# Patient Record
Sex: Male | Born: 1945
Health system: Southern US, Community
[De-identification: ages and names within clinical notes are randomized; demographics above are authoritative.]

## PROBLEM LIST (undated history)

## (undated) DIAGNOSIS — M109 Gout, unspecified: Secondary | ICD-10-CM

## (undated) DIAGNOSIS — R350 Frequency of micturition: Secondary | ICD-10-CM

## (undated) DIAGNOSIS — H9190 Unspecified hearing loss, unspecified ear: Secondary | ICD-10-CM

## (undated) DIAGNOSIS — E785 Hyperlipidemia, unspecified: Secondary | ICD-10-CM

## (undated) DIAGNOSIS — T7840XA Allergy, unspecified, initial encounter: Secondary | ICD-10-CM

## (undated) DIAGNOSIS — E119 Type 2 diabetes mellitus without complications: Secondary | ICD-10-CM

## (undated) DIAGNOSIS — N189 Chronic kidney disease, unspecified: Secondary | ICD-10-CM

## (undated) DIAGNOSIS — I10 Essential (primary) hypertension: Secondary | ICD-10-CM

## (undated) DIAGNOSIS — K219 Gastro-esophageal reflux disease without esophagitis: Secondary | ICD-10-CM

## (undated) DIAGNOSIS — H269 Unspecified cataract: Secondary | ICD-10-CM

## (undated) DIAGNOSIS — R569 Unspecified convulsions: Secondary | ICD-10-CM

## (undated) DIAGNOSIS — F419 Anxiety disorder, unspecified: Secondary | ICD-10-CM

## (undated) DIAGNOSIS — G4730 Sleep apnea, unspecified: Secondary | ICD-10-CM

## (undated) DIAGNOSIS — R7303 Prediabetes: Secondary | ICD-10-CM

## (undated) DIAGNOSIS — G4733 Obstructive sleep apnea (adult) (pediatric): Secondary | ICD-10-CM

## (undated) HISTORY — DX: Sleep apnea, unspecified: G47.30

## (undated) HISTORY — DX: Unspecified cataract: H26.9

## (undated) HISTORY — DX: Obstructive sleep apnea (adult) (pediatric): G47.33

## (undated) HISTORY — DX: Prediabetes: R73.03

## (undated) HISTORY — DX: Unspecified convulsions: R56.9

## (undated) HISTORY — DX: Hyperlipidemia, unspecified: E78.5

## (undated) HISTORY — DX: Anxiety disorder, unspecified: F41.9

## (undated) HISTORY — DX: Gout, unspecified: M10.9

## (undated) HISTORY — DX: Allergy, unspecified, initial encounter: T78.40XA

## (undated) HISTORY — DX: Chronic kidney disease, unspecified: N18.9

## (undated) HISTORY — DX: Gastro-esophageal reflux disease without esophagitis: K21.9

## (undated) HISTORY — DX: Type 2 diabetes mellitus without complications: E11.9

---
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
## 1999-01-09 ENCOUNTER — Emergency Department (HOSPITAL_COMMUNITY): Admission: EM | Admit: 1999-01-09 | Discharge: 1999-01-09 | Payer: Self-pay | Admitting: Emergency Medicine

## 1999-01-09 ENCOUNTER — Encounter: Payer: Self-pay | Admitting: Emergency Medicine

## 1999-11-09 ENCOUNTER — Encounter: Payer: Self-pay | Admitting: Internal Medicine

## 1999-11-09 ENCOUNTER — Ambulatory Visit (HOSPITAL_COMMUNITY): Admission: RE | Admit: 1999-11-09 | Discharge: 1999-11-09 | Payer: Self-pay | Admitting: Internal Medicine

## 2005-02-18 HISTORY — PX: MYRINGOTOMY: SUR874

## 2008-09-22 ENCOUNTER — Inpatient Hospital Stay: Payer: Self-pay | Admitting: Internal Medicine

## 2008-09-22 IMAGING — CR DG CHEST 1V PORT
1 series · 1 of 1 positions shown · non-contrast
Comparison: none

REASON FOR EXAM: hematemesis
COMMENTS:

PROCEDURE:     DXR - DXR PORTABLE CHEST SINGLE VIEW  - [DATE] [DATE]
RESULT:     The lung fields are clear. No pneumonia, pneumothorax, or
pleural effusion is seen. Heart size is normal. The mediastinal and osseous
structures show no significant abnormalities. Monitoring electrodes are
present.

[view not recorded]
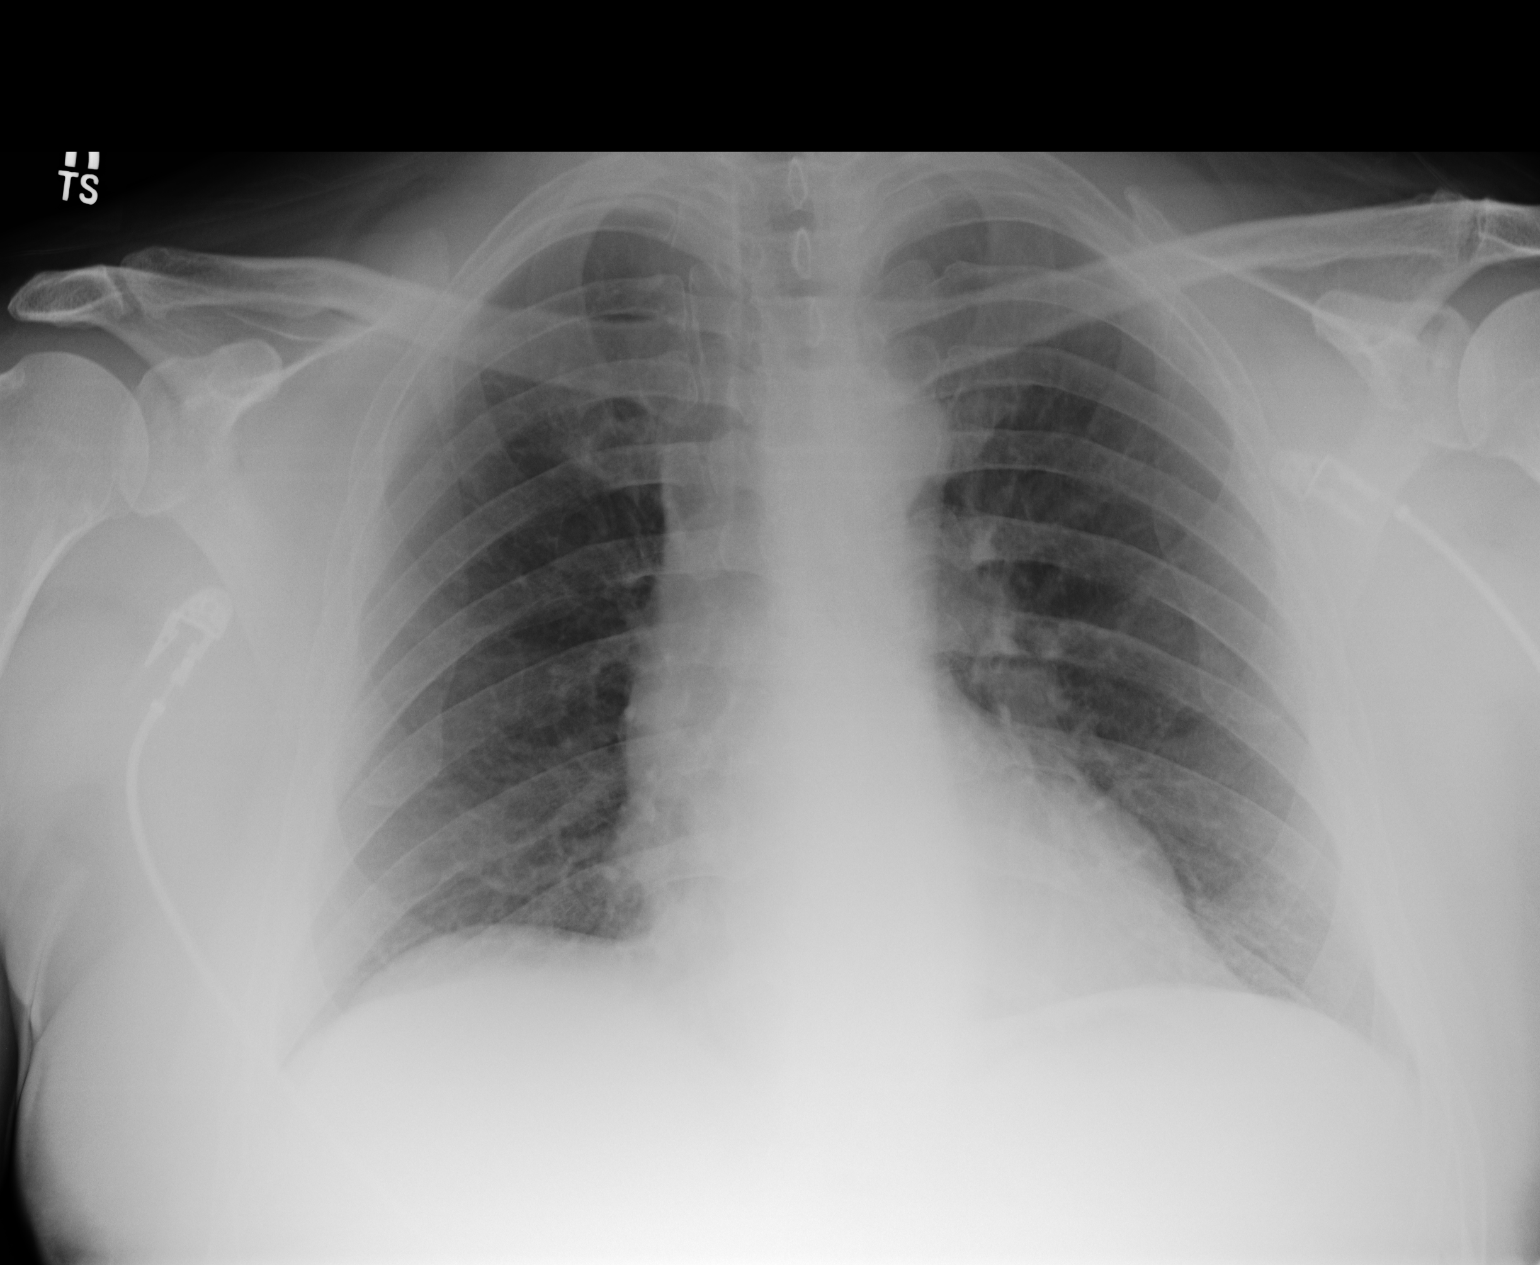

[1 of 1 positions shown; findings below may reference images not displayed]

IMPRESSION: 1.     No acute changes are identified.

## 2008-09-23 IMAGING — US US CAROTID DUPLEX BILAT
1 series · 17 of 24 positions shown · non-contrast
Comparison: none

REASON FOR EXAM: syncope
COMMENTS:

[Series 1: us carotid duplex bilat · 17 of 61 slices shown]
[im 1/61]
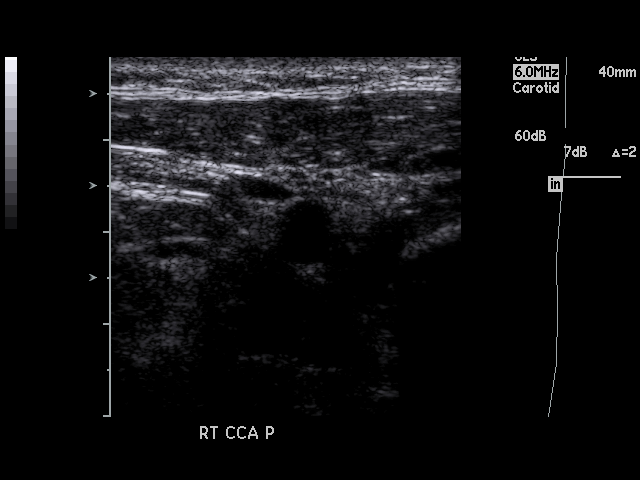
[im 6/61]
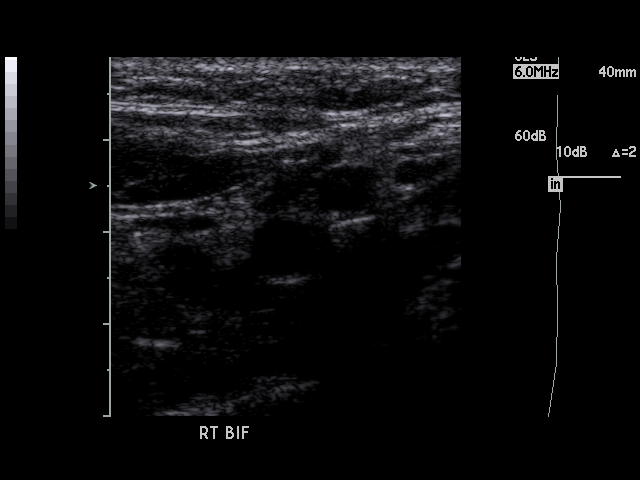
[im 8/61]
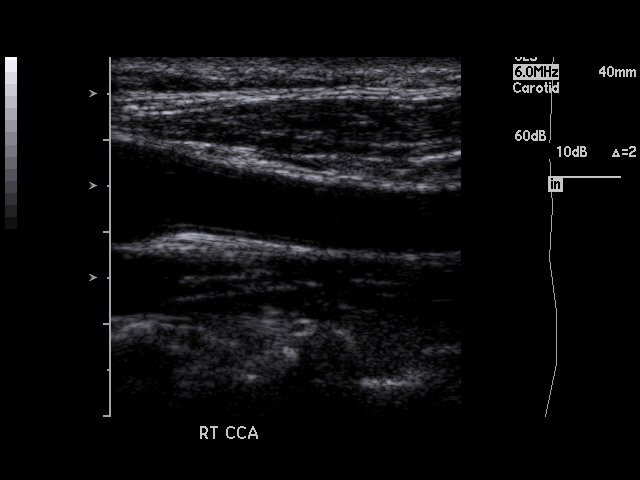
[im 11/61]
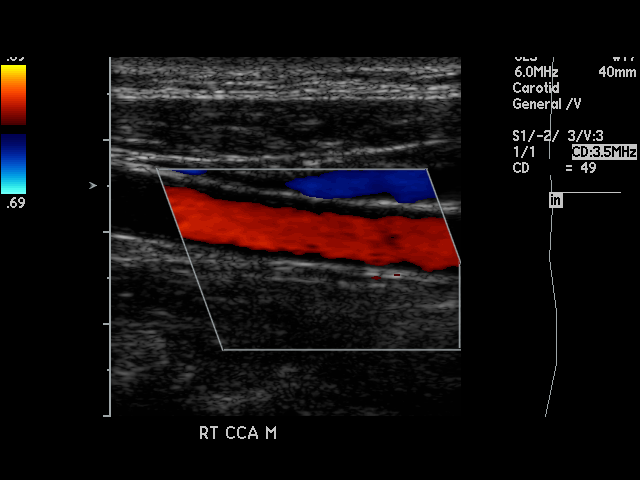
[im 16/61]
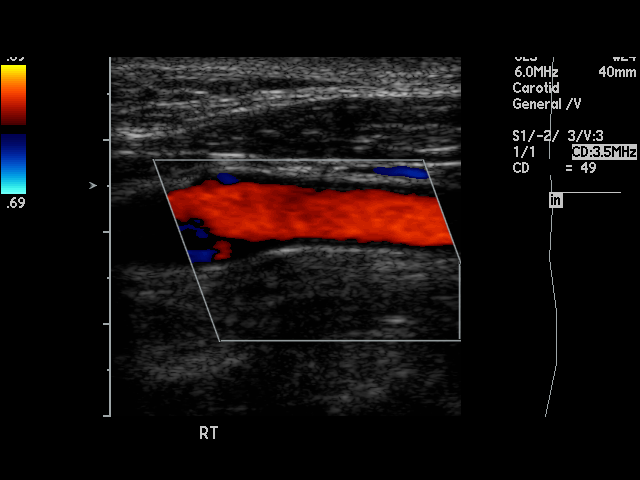
[im 19/61]
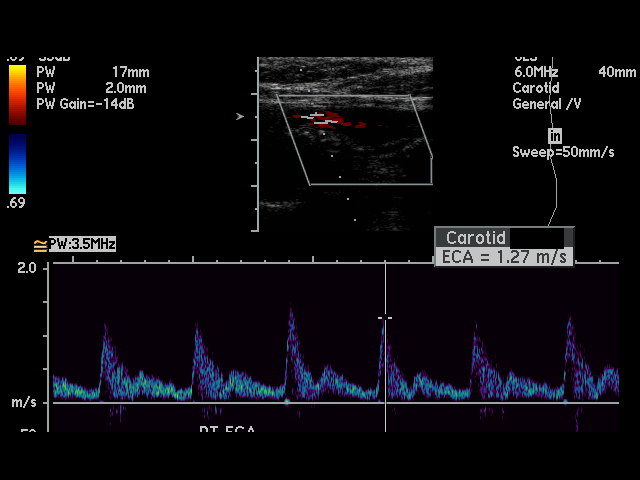
[im 24/61]
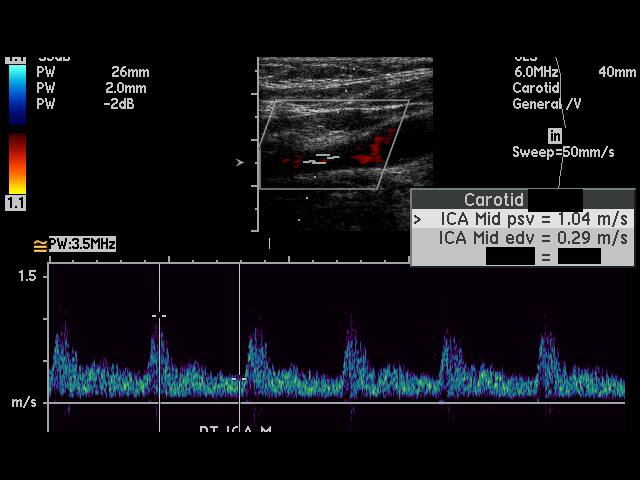
[im 27/61]
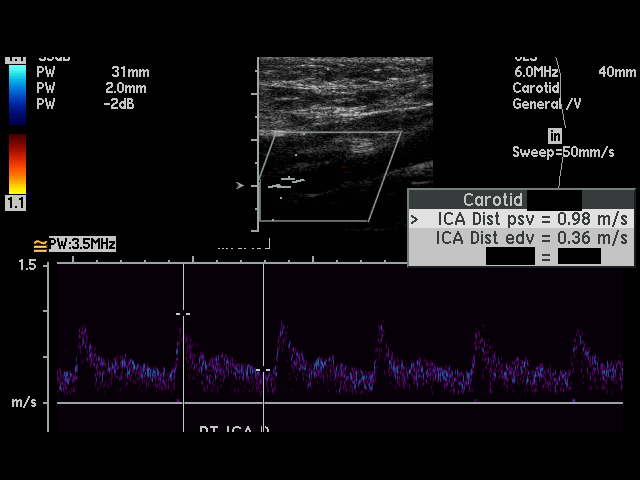
[im 32/61]
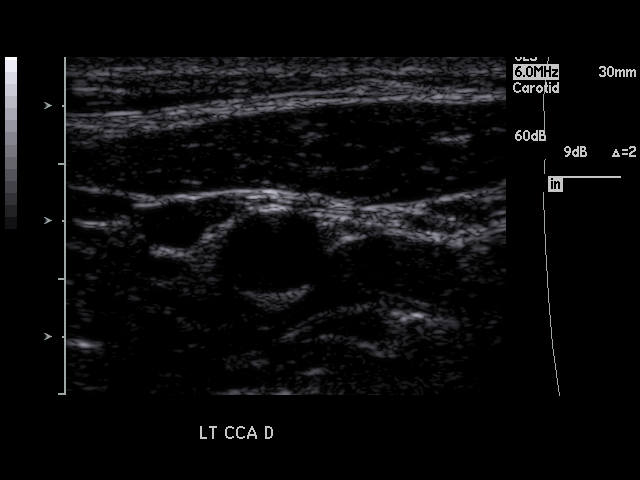
[im 34/61]
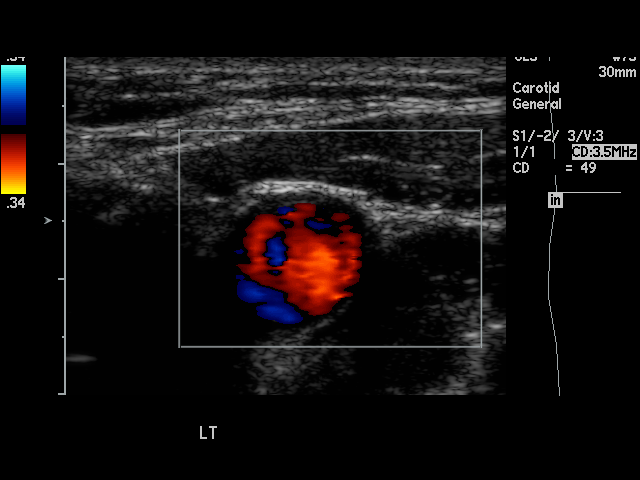
[im 37/61]
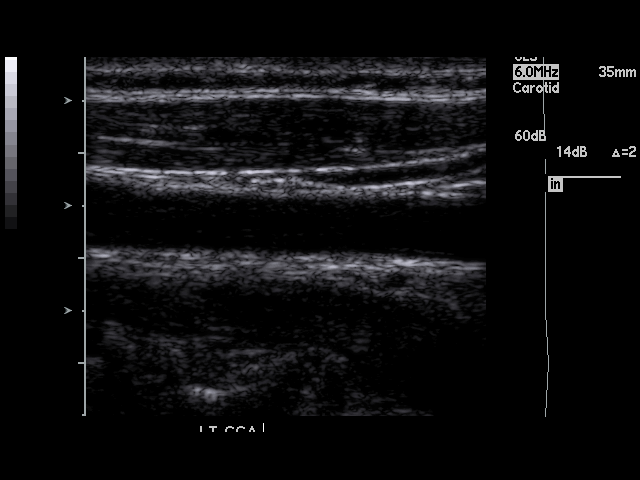
[im 42/61]
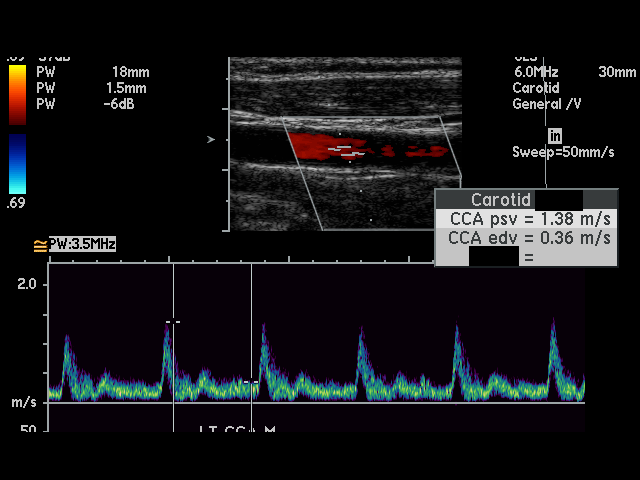
[im 45/61]
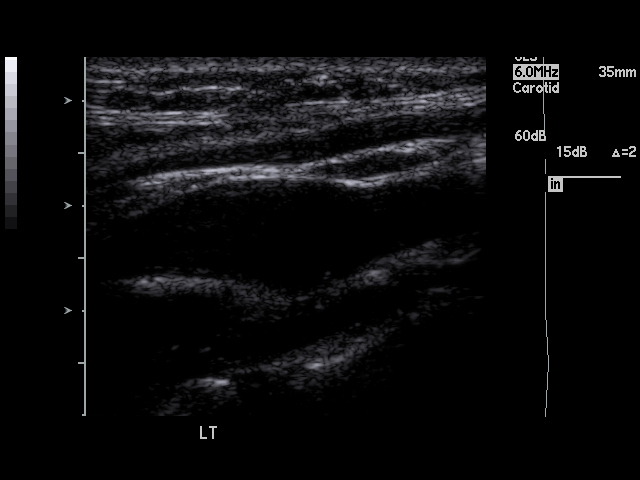
[im 50/61]
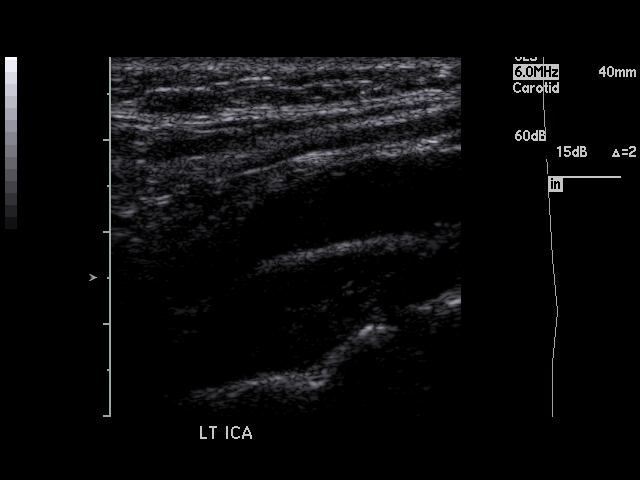
[im 53/61]
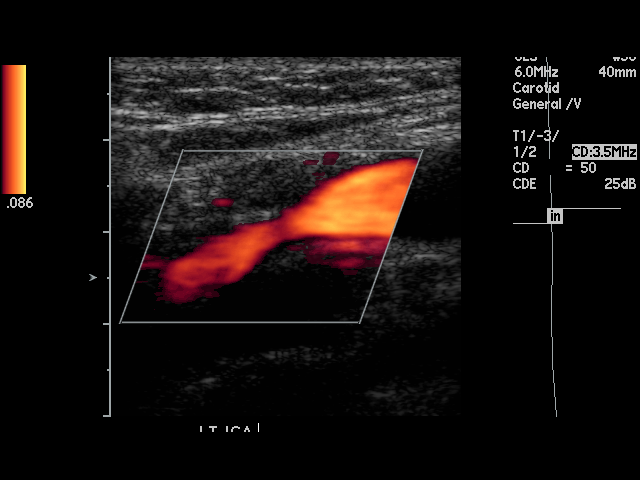
[im 55/61]
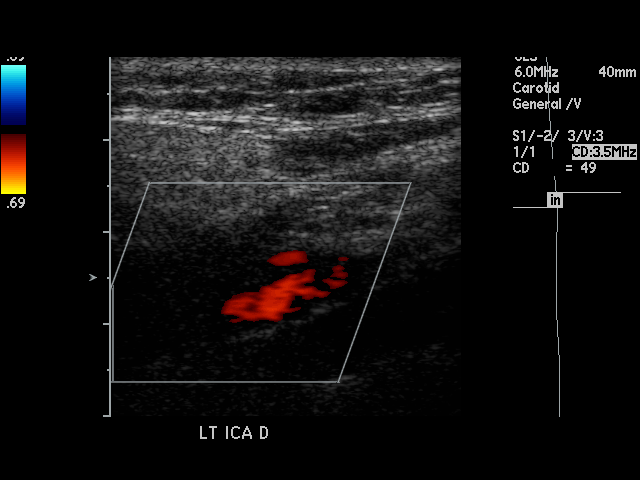
[im 61/61]
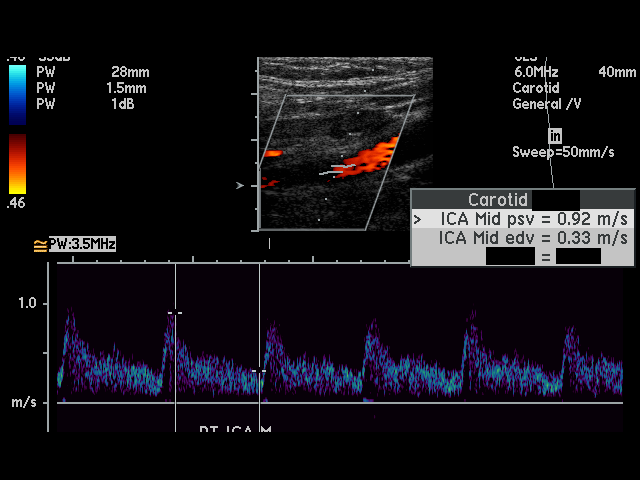

[17 of 24 positions shown; findings below may reference images not displayed]

PROCEDURE:     US  - US CAROTID DOPPLER BILATERAL  - [DATE]  [DATE]

RESULT:     No definite plaque formation is seen on either side. On the
RIGHT, the peak RIGHT common carotid artery flow velocity measure
meters per second and the peak RIGHT internal carotid artery flow velocity
measures 1.61 meters per second. The ICA/CCA ratio is 1.158.  On the LEFT,
the peak LEFT common carotid artery flow velocity measures 1.49 meters per
second and the peak LEFT internal carotid artery flow velocity measures
meters per second. The ICA/CCA ratio is 0.732.  These values bilaterally are
in the normal range and are consistent with the absence of hemodynamically
significant stenosis.

There is observed antegrade flow in both vertebrals.
IMPRESSION: 1. Normal study. No plaque formation or stenosis is seen on either side.
2. There is antegrade flow in both vertebrals.

## 2008-09-23 IMAGING — CT CT HEAD WITHOUT CONTRAST
2 series · 16 of 30 positions shown, 20 images · non-contrast
Comparison: none

REASON FOR EXAM: syncope
COMMENTS:

[Series 2: without · axial · non-contrast · 0.44mm/px · z∈[+714,+844]mm · 13 of 32 slices shown, 17 images]
[im 3/32  brain]
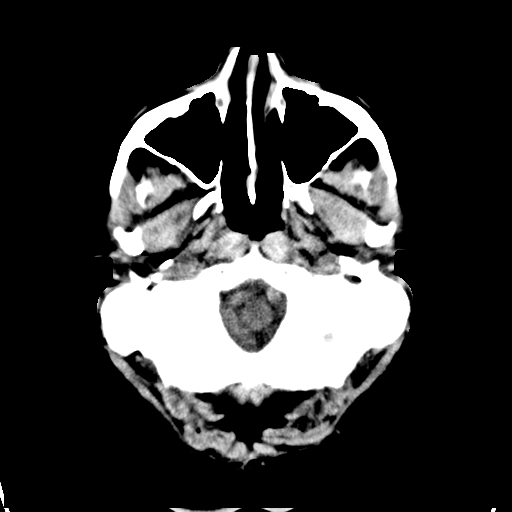
[im 3/32  bone]
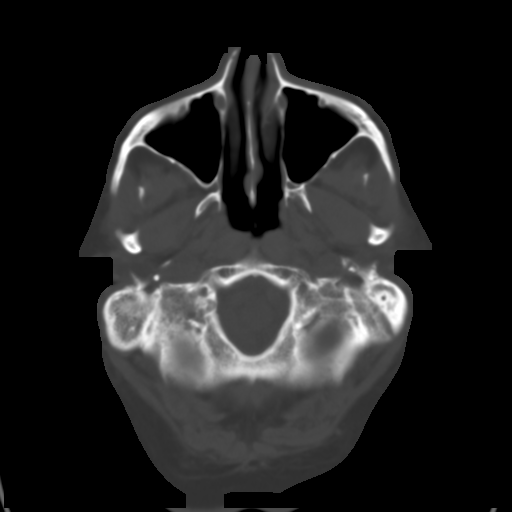
[im 5/32  brain]
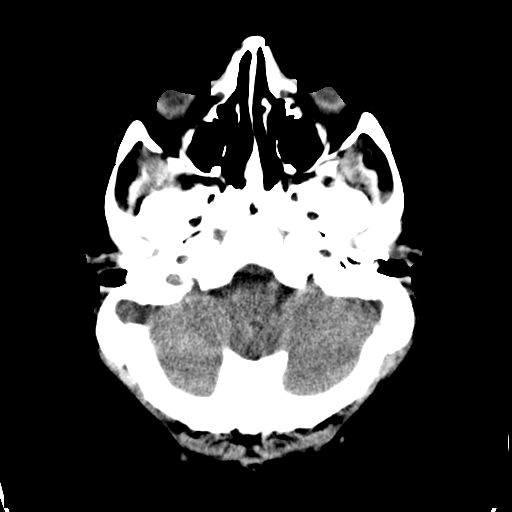
[im 7/32  brain]
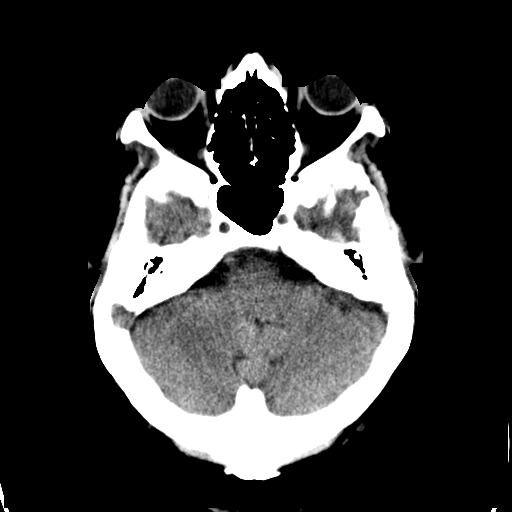
[im 9/32  brain]
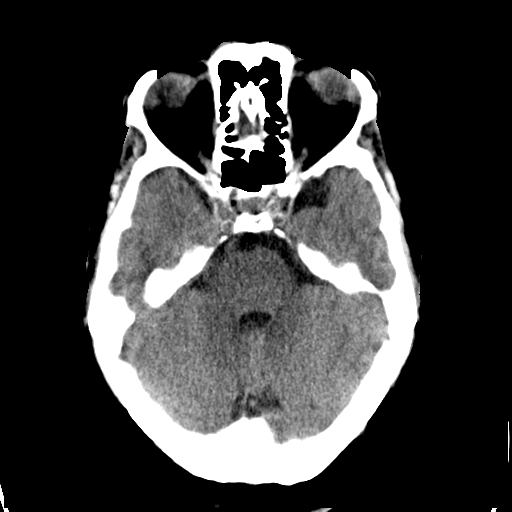
[im 12/32  brain]
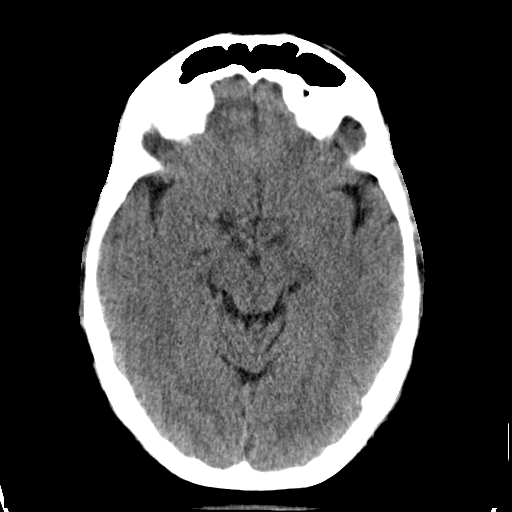
[im 12/32  bone]
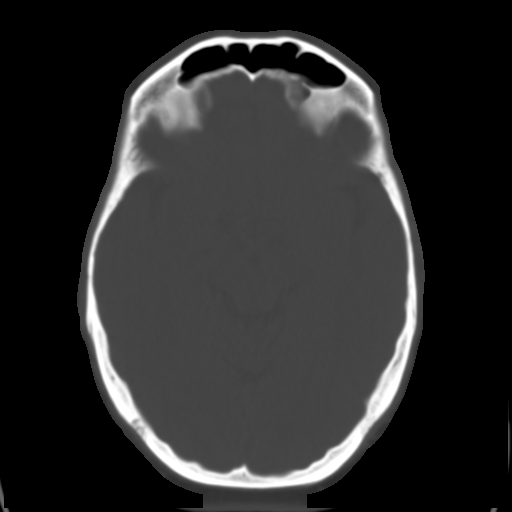
[im 14/32  brain]
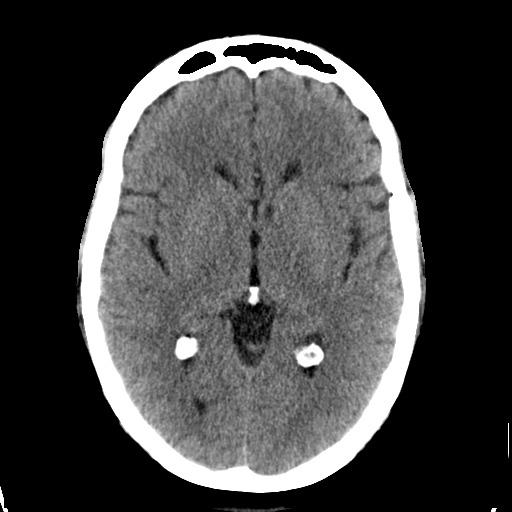
[im 16/32  brain]
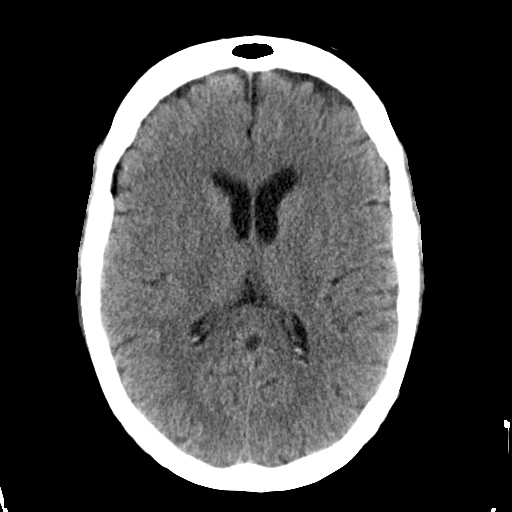
[im 18/32  brain]
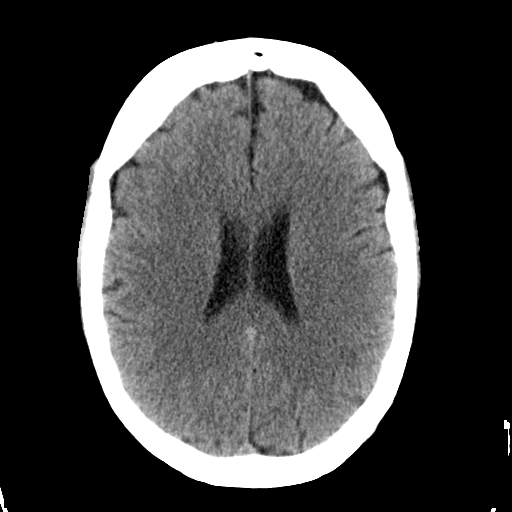
[im 20/32  brain]
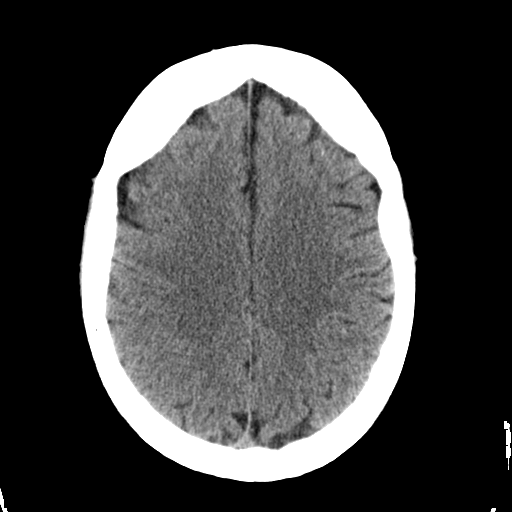
[im 20/32  bone]
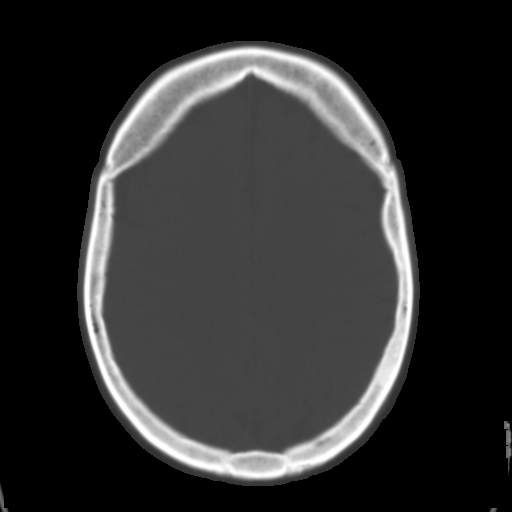
[im 23/32  brain]
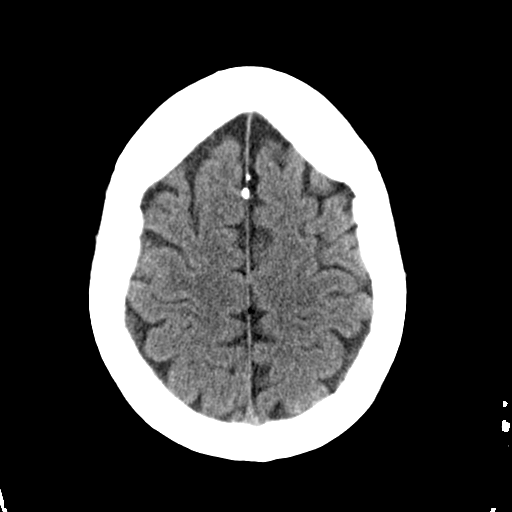
[im 25/32  brain]
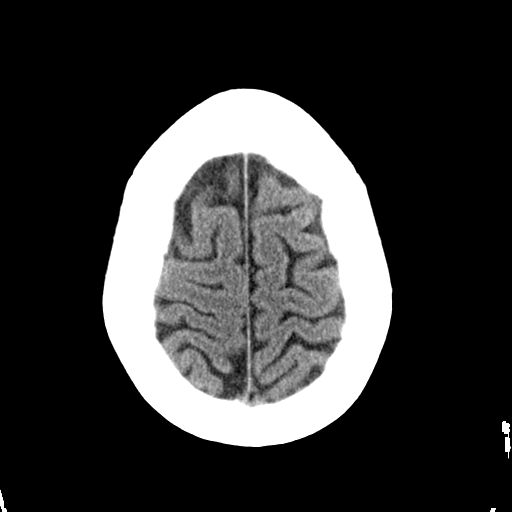
[im 27/32  brain]
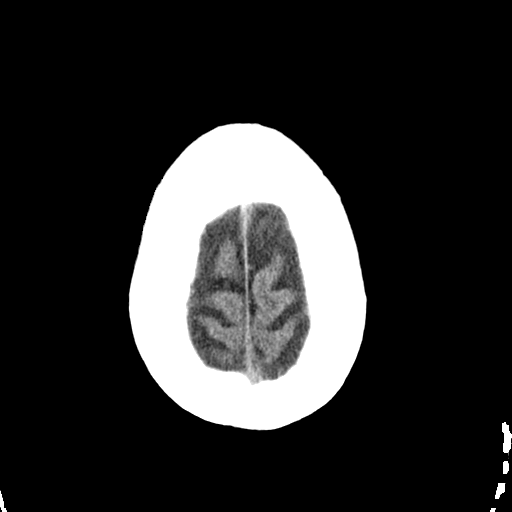
[im 29/32  brain]
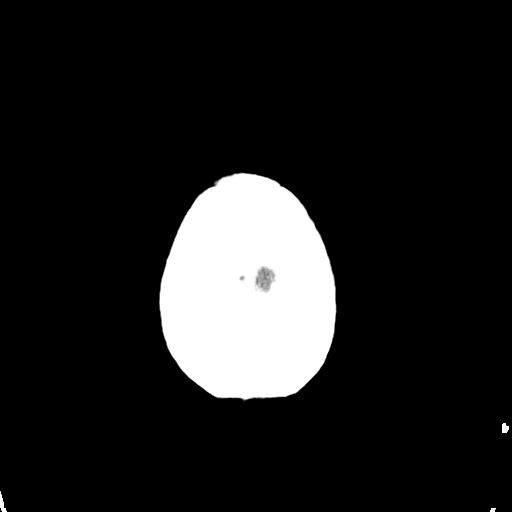
[im 29/32  bone]
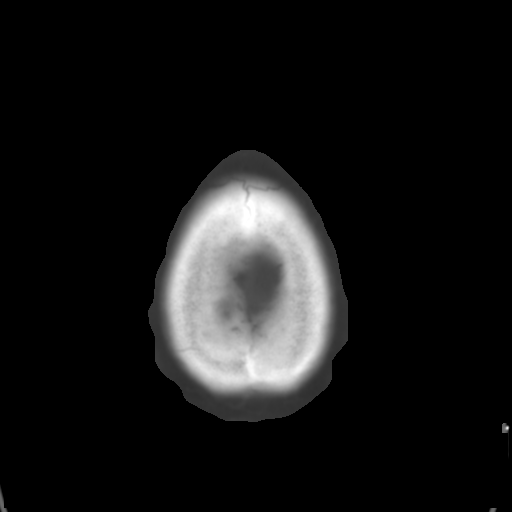

[Series 3: bone · axial · 0.44mm/px · z∈[+714,+760]mm · 3 of 32 slices shown]
[im 3/32  bone]
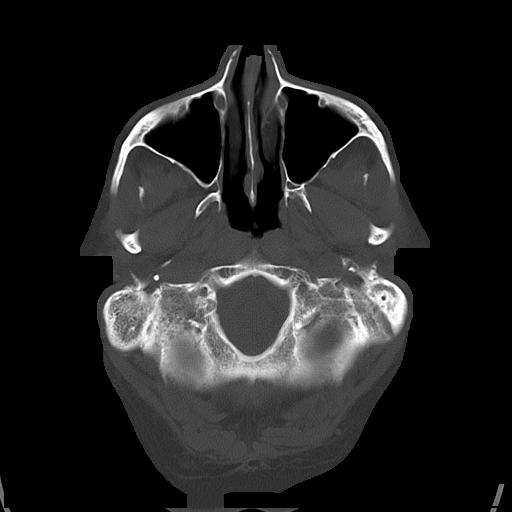
[im 7/32  bone]
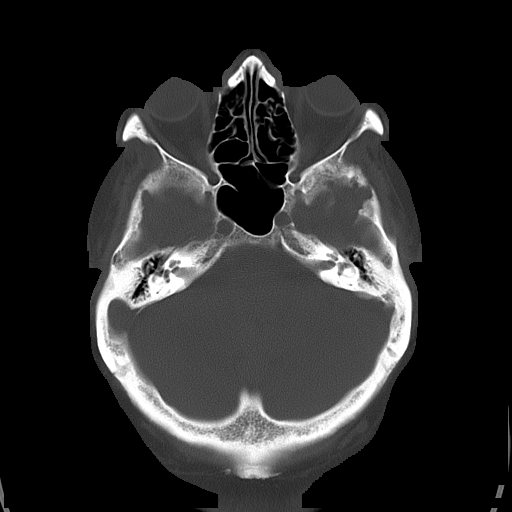
[im 12/32  bone]
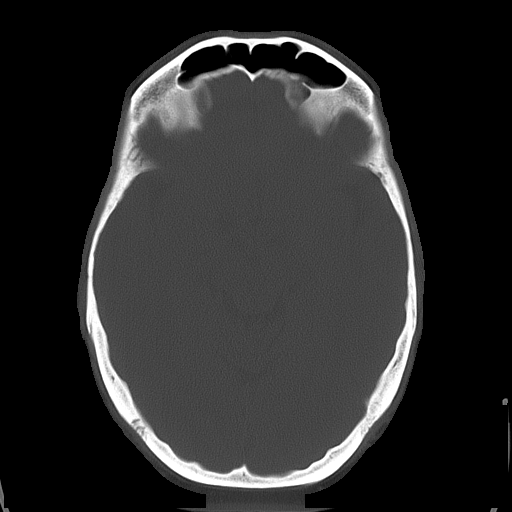

[16 of 30 positions shown; findings below may reference images not displayed]

PROCEDURE:     CT  - CT HEAD WITHOUT CONTRAST  - [DATE] [DATE]

RESULT:     Noncontrast CT of the brain is performed. The patient has no
previous exam for comparison.

The ventricles and sulci are normal. There is no hemorrhage. There is no
focal mass, mass-effect or midline shift. There is no evidence of edema or
territorial infarct. The bone windows demonstrate normal aeration of the
paranasal sinuses and mastoid air cells. There is no skull fracture
demonstrated.
IMPRESSION: 1. No acute intracranial abnormality.

## 2008-10-13 LAB — HM COLONOSCOPY

## 2009-02-25 ENCOUNTER — Emergency Department: Payer: Self-pay | Admitting: Emergency Medicine

## 2009-10-19 HISTORY — PX: FOOT SURGERY: SHX648

## 2010-09-30 ENCOUNTER — Other Ambulatory Visit: Payer: Self-pay | Admitting: Podiatry

## 2010-09-30 ENCOUNTER — Ambulatory Visit (HOSPITAL_COMMUNITY)
Admission: RE | Admit: 2010-09-30 | Discharge: 2010-09-30 | Disposition: A | Discharge status: Home / Self Care | Payer: BC Managed Care – PPO | Source: Ambulatory Visit | Attending: Podiatry | Admitting: Podiatry

## 2010-09-30 ENCOUNTER — Encounter (HOSPITAL_COMMUNITY): Payer: Self-pay

## 2010-09-30 DIAGNOSIS — M19079 Primary osteoarthritis, unspecified ankle and foot: Secondary | ICD-10-CM | POA: Insufficient documentation

## 2010-09-30 DIAGNOSIS — M25579 Pain in unspecified ankle and joints of unspecified foot: Secondary | ICD-10-CM | POA: Insufficient documentation

## 2010-09-30 DIAGNOSIS — S92309A Fracture of unspecified metatarsal bone(s), unspecified foot, initial encounter for closed fracture: Secondary | ICD-10-CM

## 2010-09-30 DIAGNOSIS — R609 Edema, unspecified: Secondary | ICD-10-CM | POA: Insufficient documentation

## 2010-09-30 DIAGNOSIS — IMO0002 Reserved for concepts with insufficient information to code with codable children: Secondary | ICD-10-CM | POA: Insufficient documentation

## 2010-09-30 HISTORY — DX: Essential (primary) hypertension: I10

## 2010-09-30 IMAGING — CT CT FOOT*R* W/O CM
3 series · 16 of 33 positions shown, 19 images · non-contrast
Comparison: None available.

CLINICAL DATA: Pain with weightbearing.  Swelling for 2-3 months.
History of fracture through toes.

CT OF THE RIGHT FOOT WITHOUT CONTRAST
TECHNIQUE: Multidetector CT imaging was performed according to the
standard protocol. Multiplanar CT image reconstructions were also
generated.

[Series 2: lower ext · axial · 0.55mm/px · z∈[-183,-36]mm · 8 of 71 slices shown, 10 images]
[im 6/71  soft-tissue]
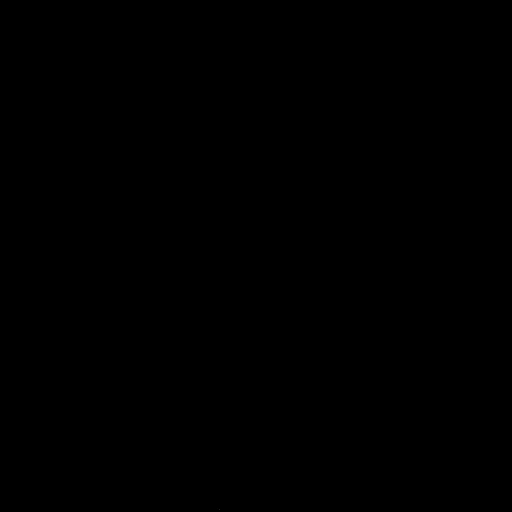
[im 6/71  bone]
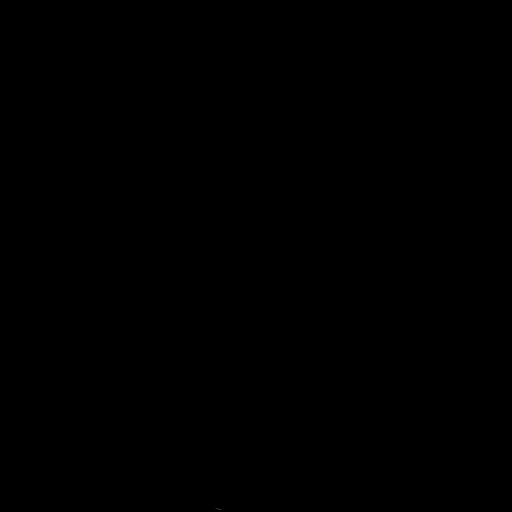
[im 17/71  bone]
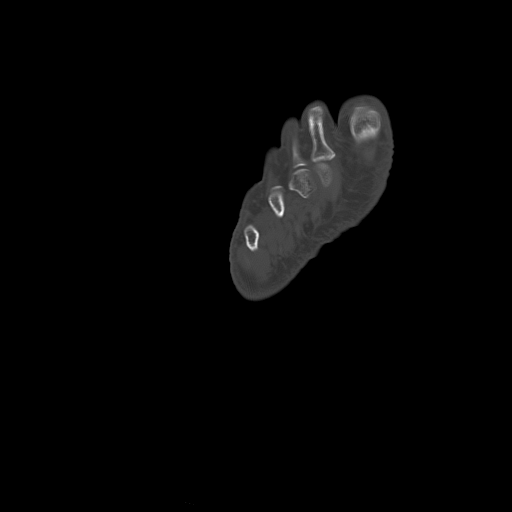
[im 22/71  bone]
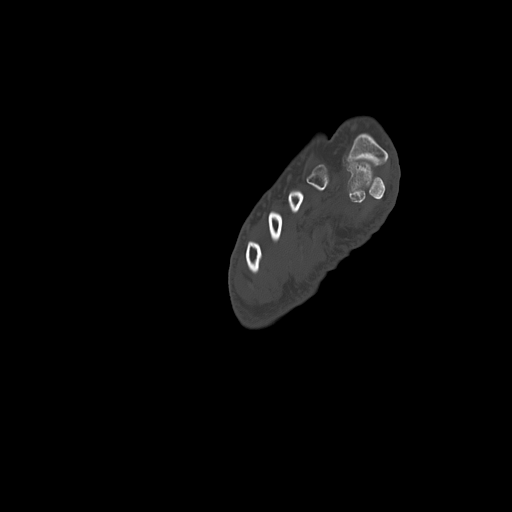
[im 33/71  bone]
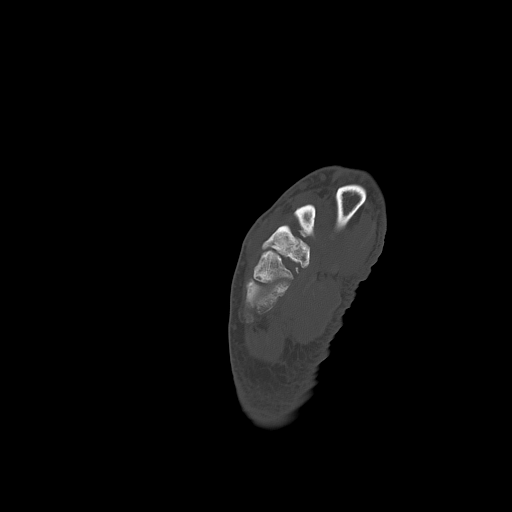
[im 38/71  soft-tissue]
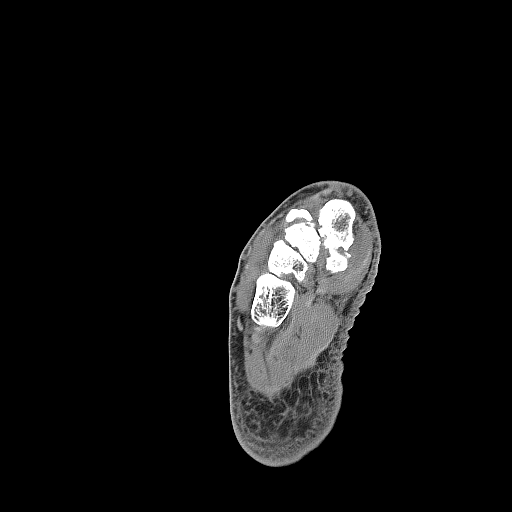
[im 38/71  bone]
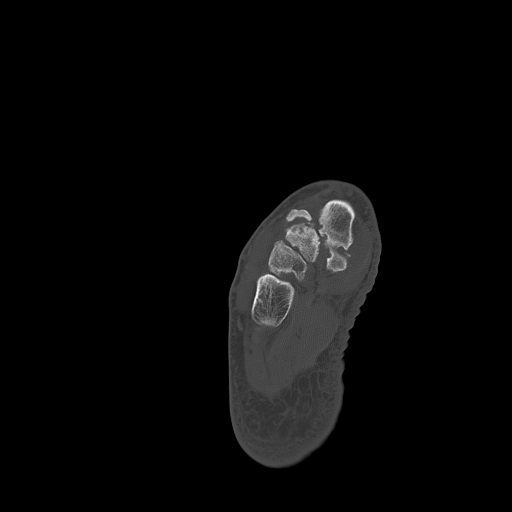
[im 49/71  bone]
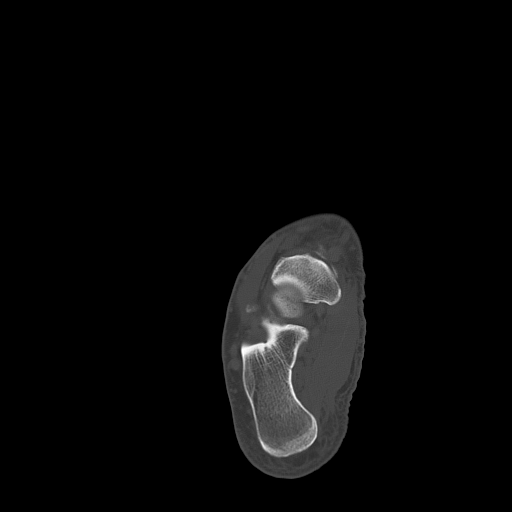
[im 54/71  bone]
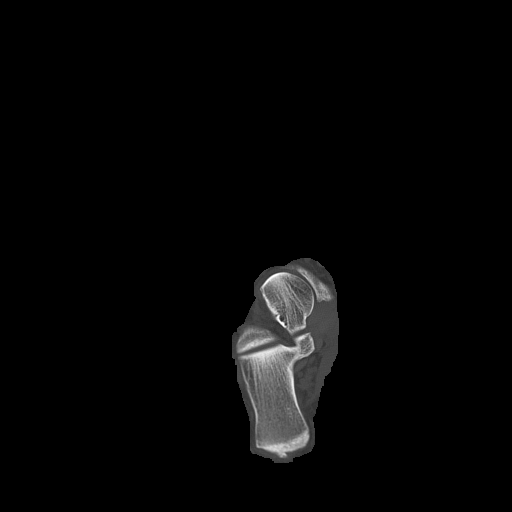
[im 65/71  bone]
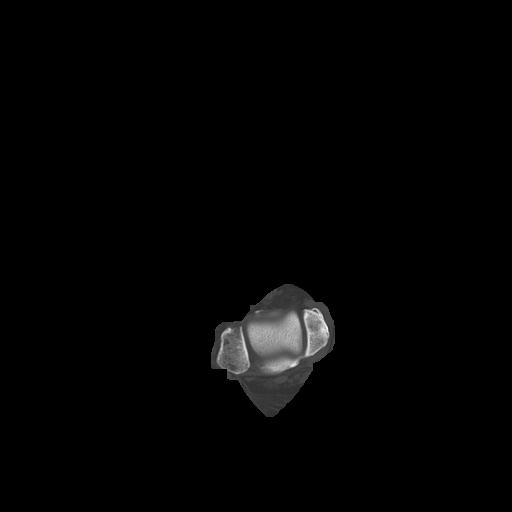

[Series 300: sagittal · coronal · 0.55mm/px · 5 of 36 slices shown, 6 images]
[im 12/36  bone]
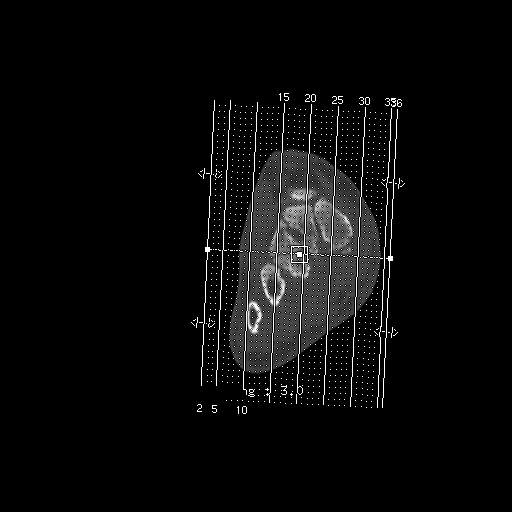
[im 15/36  bone]
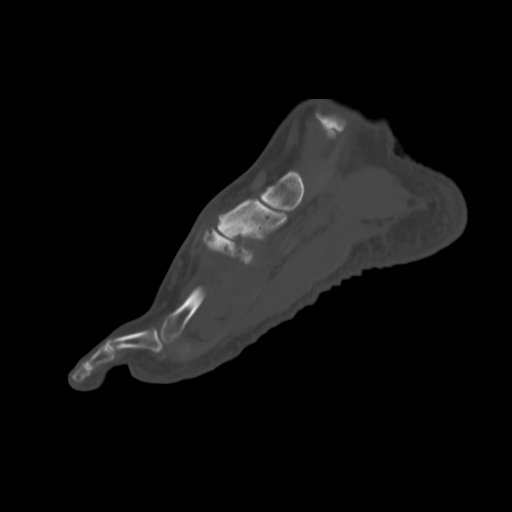
[im 18/36  soft-tissue]
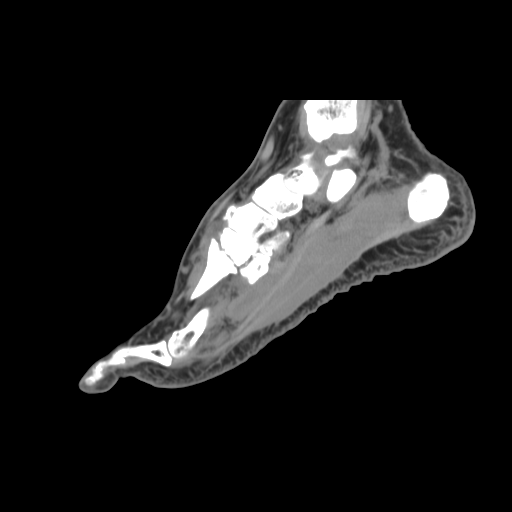
[im 18/36  bone]
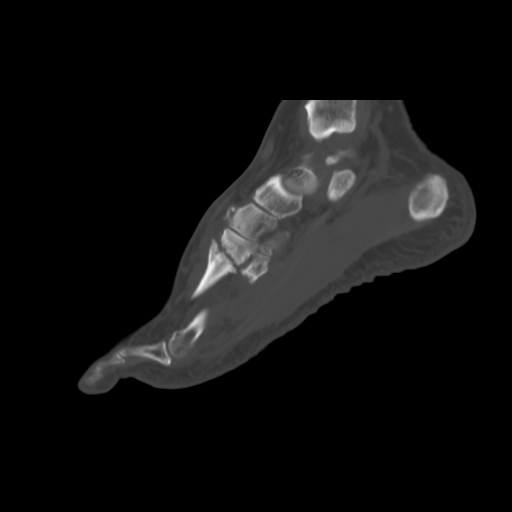
[im 21/36  bone]
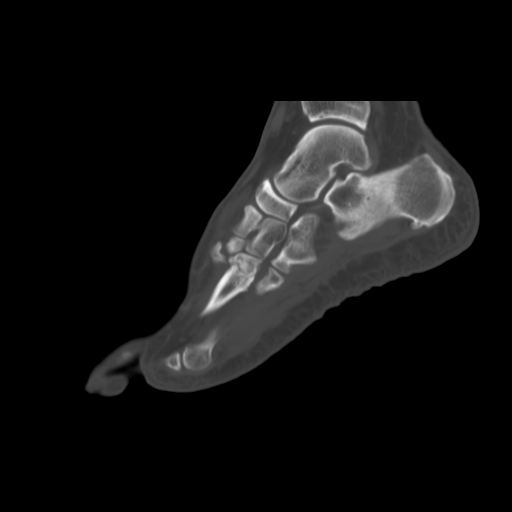
[im 24/36  bone]
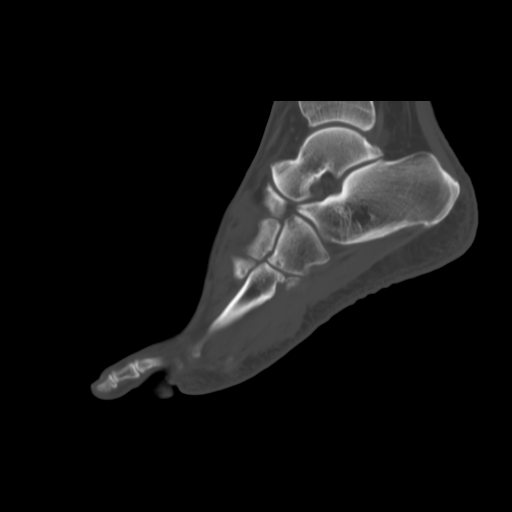

[Series 301: coronal · coronal · 0.55mm/px · 3 of 75 slices shown]
[im 15/75  bone]
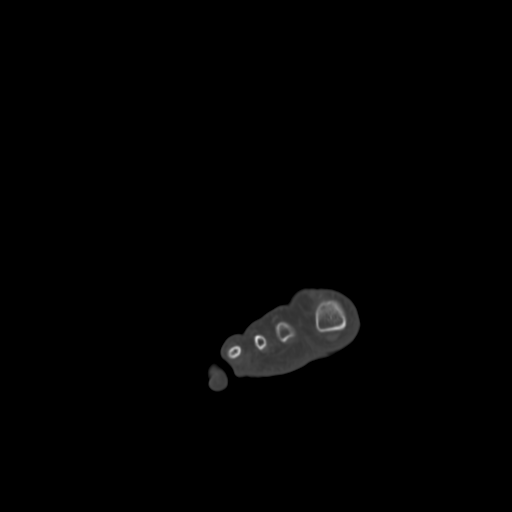
[im 30/75  bone]
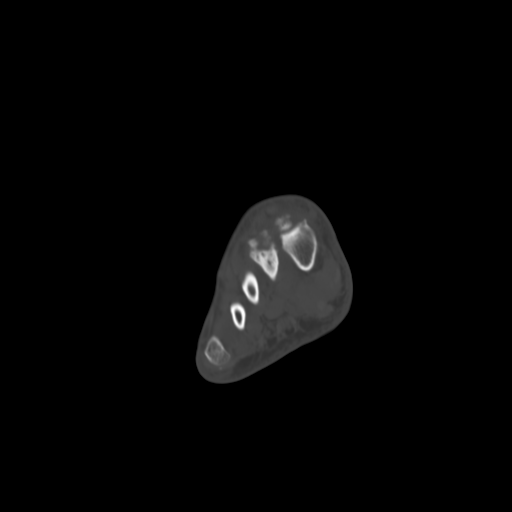
[im 45/75  bone]
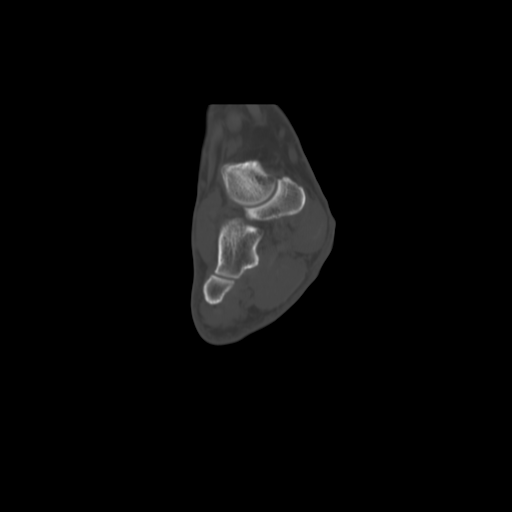

[16 of 33 positions shown; findings below may reference images not displayed]

FINDINGS: The patient has fractures through the proximal metaphyses
of the second and third metatarsals.  The fracture of the second
metatarsal is a nonunion with no bridging bone identified.
Position and alignment appear near anatomic.  Second metatarsal
fractures is a partial union and is nondisplaced.  There is a chip
fracture off of the plantar aspect of the base of the first
metatarsal which is a nonunion.  Erosive change is seen along the
lateral aspect of the medial cuneiform at the first tarsometatarsal
joint.  This may represent post-traumatic osteolysis or possibly
gout.  Degenerative change is seen of the fourth tarsometatarsal
joint.  There is also some first MTP osteoarthritis.  .
IMPRESSION: 1.  Nonunion of a fracture through the proximal metaphysis of the
second metatarsal.
2.  Partial union of a nondisplaced fracture of a fracture of the
proximal metaphysis of the third metatarsal.
3.  Chip fracture off the plantar aspect of the base the first
metatarsal.
4.  Degenerative disease of the first tarsometatarsal joint with
some erosive change along the lateral aspect of the medial
cuneiform.  This could be post-traumatic or possibly due to [HOSPITAL]
arthropathy such as gout.
8.  First MTP and fourth TMT osteoarthritis.

## 2012-07-17 DIAGNOSIS — E119 Type 2 diabetes mellitus without complications: Secondary | ICD-10-CM | POA: Diagnosis not present

## 2012-07-17 DIAGNOSIS — Z23 Encounter for immunization: Secondary | ICD-10-CM | POA: Diagnosis not present

## 2012-07-17 DIAGNOSIS — R7309 Other abnormal glucose: Secondary | ICD-10-CM | POA: Diagnosis not present

## 2012-07-17 DIAGNOSIS — J301 Allergic rhinitis due to pollen: Secondary | ICD-10-CM | POA: Diagnosis not present

## 2012-07-17 DIAGNOSIS — E782 Mixed hyperlipidemia: Secondary | ICD-10-CM | POA: Diagnosis not present

## 2012-07-17 DIAGNOSIS — I10 Essential (primary) hypertension: Secondary | ICD-10-CM | POA: Diagnosis not present

## 2012-07-17 DIAGNOSIS — R05 Cough: Secondary | ICD-10-CM | POA: Diagnosis not present

## 2012-07-17 DIAGNOSIS — Z1212 Encounter for screening for malignant neoplasm of rectum: Secondary | ICD-10-CM | POA: Diagnosis not present

## 2012-07-24 DIAGNOSIS — E559 Vitamin D deficiency, unspecified: Secondary | ICD-10-CM | POA: Diagnosis not present

## 2012-07-24 DIAGNOSIS — Z125 Encounter for screening for malignant neoplasm of prostate: Secondary | ICD-10-CM | POA: Diagnosis not present

## 2012-07-24 DIAGNOSIS — Z1212 Encounter for screening for malignant neoplasm of rectum: Secondary | ICD-10-CM | POA: Diagnosis not present

## 2012-07-24 DIAGNOSIS — N3000 Acute cystitis without hematuria: Secondary | ICD-10-CM | POA: Diagnosis not present

## 2012-07-24 DIAGNOSIS — J301 Allergic rhinitis due to pollen: Secondary | ICD-10-CM | POA: Diagnosis not present

## 2012-07-24 DIAGNOSIS — Z23 Encounter for immunization: Secondary | ICD-10-CM | POA: Diagnosis not present

## 2012-07-24 DIAGNOSIS — R05 Cough: Secondary | ICD-10-CM | POA: Diagnosis not present

## 2012-07-24 DIAGNOSIS — R7309 Other abnormal glucose: Secondary | ICD-10-CM | POA: Diagnosis not present

## 2012-07-24 DIAGNOSIS — E119 Type 2 diabetes mellitus without complications: Secondary | ICD-10-CM | POA: Diagnosis not present

## 2012-07-24 DIAGNOSIS — N529 Male erectile dysfunction, unspecified: Secondary | ICD-10-CM | POA: Diagnosis not present

## 2012-07-24 DIAGNOSIS — E782 Mixed hyperlipidemia: Secondary | ICD-10-CM | POA: Diagnosis not present

## 2012-07-24 DIAGNOSIS — I10 Essential (primary) hypertension: Secondary | ICD-10-CM | POA: Diagnosis not present

## 2012-07-24 DIAGNOSIS — Z79899 Other long term (current) drug therapy: Secondary | ICD-10-CM | POA: Diagnosis not present

## 2012-07-30 ENCOUNTER — Ambulatory Visit (INDEPENDENT_AMBULATORY_CARE_PROVIDER_SITE_OTHER): Payer: Medicare Other | Admitting: General Surgery

## 2012-07-30 ENCOUNTER — Encounter (INDEPENDENT_AMBULATORY_CARE_PROVIDER_SITE_OTHER): Payer: Self-pay | Admitting: General Surgery

## 2012-07-30 VITALS — BP 129/86 | HR 84 | Temp 98.6°F | Resp 16 | Ht 70.0 in | Wt 235.2 lb

## 2012-07-30 DIAGNOSIS — K4090 Unilateral inguinal hernia, without obstruction or gangrene, not specified as recurrent: Secondary | ICD-10-CM

## 2012-07-30 NOTE — Progress Notes (Signed)
Subjective:     Patient ID: Jeffery Reed, male   DOB: Jan 01, 1946, 66 y.o.   MRN: [id number]  HPI The patient is a 66 year old male with a right inguinal hernia for several months. 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 He states that his cough he noticed a bulge. He recently saw his medicine doctor which reduced his the bulge has not had any incarcerated since that time.  Patient had no signs or symptoms of strangulation.  Review of Systems  Constitutional: Negative.   Eyes: Negative.   Cardiovascular: Negative.   Gastrointestinal: Negative.   All other systems reviewed and are negative.       Objective:   Physical Exam  Constitutional: He is oriented to person, place, and time. He appears well-developed and well-nourished.  HENT:  Head: Normocephalic.  Eyes: Conjunctivae normal and EOM are normal. Pupils are equal, round, and reactive to light.  Neck: Normal range of motion. Neck supple.  Cardiovascular: Normal rate, regular rhythm and normal heart sounds.   Pulmonary/Chest: Effort normal and breath sounds normal.  Abdominal: Soft. Bowel sounds are normal. A hernia is present. Hernia confirmed positive in the right inguinal area. Hernia confirmed negative in the left inguinal area.  Neurological: He is oriented to person, place, and time.       Assessment:     54 -year-old male with a right inguinal hernia.   Plan:     With the patient like to have his retinal hernia repair 6 or more convenient time which is likely in March. Patient will commence clinic in February for follow up as well as scheduling of his right inguinal hernia repair. We discussed the signs and symptoms for need to proceed to ED should the patient have signs of incarceration her fragmentation.Marland Kitchen

## 2012-08-22 DIAGNOSIS — J301 Allergic rhinitis due to pollen: Secondary | ICD-10-CM | POA: Diagnosis not present

## 2012-08-22 DIAGNOSIS — E119 Type 2 diabetes mellitus without complications: Secondary | ICD-10-CM | POA: Diagnosis not present

## 2012-08-22 DIAGNOSIS — Z1212 Encounter for screening for malignant neoplasm of rectum: Secondary | ICD-10-CM | POA: Diagnosis not present

## 2012-08-22 DIAGNOSIS — R05 Cough: Secondary | ICD-10-CM | POA: Diagnosis not present

## 2012-08-22 DIAGNOSIS — Z23 Encounter for immunization: Secondary | ICD-10-CM | POA: Diagnosis not present

## 2012-08-22 DIAGNOSIS — I10 Essential (primary) hypertension: Secondary | ICD-10-CM | POA: Diagnosis not present

## 2012-08-22 DIAGNOSIS — E782 Mixed hyperlipidemia: Secondary | ICD-10-CM | POA: Diagnosis not present

## 2012-08-22 DIAGNOSIS — R7309 Other abnormal glucose: Secondary | ICD-10-CM | POA: Diagnosis not present

## 2012-09-09 DIAGNOSIS — R490 Dysphonia: Secondary | ICD-10-CM | POA: Diagnosis not present

## 2012-09-09 DIAGNOSIS — J310 Chronic rhinitis: Secondary | ICD-10-CM | POA: Diagnosis not present

## 2012-09-18 DIAGNOSIS — R490 Dysphonia: Secondary | ICD-10-CM | POA: Diagnosis not present

## 2012-10-02 DIAGNOSIS — D101 Benign neoplasm of tongue: Secondary | ICD-10-CM | POA: Diagnosis not present

## 2012-10-02 DIAGNOSIS — D490 Neoplasm of unspecified behavior of digestive system: Secondary | ICD-10-CM | POA: Diagnosis not present

## 2012-10-23 DIAGNOSIS — R7309 Other abnormal glucose: Secondary | ICD-10-CM | POA: Diagnosis not present

## 2012-10-23 DIAGNOSIS — E782 Mixed hyperlipidemia: Secondary | ICD-10-CM | POA: Diagnosis not present

## 2012-10-23 DIAGNOSIS — Z23 Encounter for immunization: Secondary | ICD-10-CM | POA: Diagnosis not present

## 2012-10-23 DIAGNOSIS — E559 Vitamin D deficiency, unspecified: Secondary | ICD-10-CM | POA: Diagnosis not present

## 2012-10-23 DIAGNOSIS — I10 Essential (primary) hypertension: Secondary | ICD-10-CM | POA: Diagnosis not present

## 2012-10-23 DIAGNOSIS — R05 Cough: Secondary | ICD-10-CM | POA: Diagnosis not present

## 2012-10-23 DIAGNOSIS — E119 Type 2 diabetes mellitus without complications: Secondary | ICD-10-CM | POA: Diagnosis not present

## 2012-12-12 DIAGNOSIS — M109 Gout, unspecified: Secondary | ICD-10-CM | POA: Diagnosis not present

## 2013-01-23 DIAGNOSIS — M109 Gout, unspecified: Secondary | ICD-10-CM | POA: Diagnosis not present

## 2013-01-23 DIAGNOSIS — E559 Vitamin D deficiency, unspecified: Secondary | ICD-10-CM | POA: Diagnosis not present

## 2013-01-23 DIAGNOSIS — E782 Mixed hyperlipidemia: Secondary | ICD-10-CM | POA: Diagnosis not present

## 2013-01-23 DIAGNOSIS — I10 Essential (primary) hypertension: Secondary | ICD-10-CM | POA: Diagnosis not present

## 2013-01-23 DIAGNOSIS — E119 Type 2 diabetes mellitus without complications: Secondary | ICD-10-CM | POA: Diagnosis not present

## 2013-01-23 DIAGNOSIS — Z79899 Other long term (current) drug therapy: Secondary | ICD-10-CM | POA: Diagnosis not present

## 2013-06-05 DIAGNOSIS — R7309 Other abnormal glucose: Secondary | ICD-10-CM | POA: Diagnosis not present

## 2013-06-05 DIAGNOSIS — Z23 Encounter for immunization: Secondary | ICD-10-CM | POA: Diagnosis not present

## 2013-06-05 DIAGNOSIS — E119 Type 2 diabetes mellitus without complications: Secondary | ICD-10-CM | POA: Diagnosis not present

## 2013-06-05 DIAGNOSIS — E782 Mixed hyperlipidemia: Secondary | ICD-10-CM | POA: Diagnosis not present

## 2013-06-05 DIAGNOSIS — I10 Essential (primary) hypertension: Secondary | ICD-10-CM | POA: Diagnosis not present

## 2013-06-05 DIAGNOSIS — Z1212 Encounter for screening for malignant neoplasm of rectum: Secondary | ICD-10-CM | POA: Diagnosis not present

## 2013-06-05 DIAGNOSIS — R05 Cough: Secondary | ICD-10-CM | POA: Diagnosis not present

## 2013-06-05 DIAGNOSIS — J301 Allergic rhinitis due to pollen: Secondary | ICD-10-CM | POA: Diagnosis not present

## 2013-06-19 DIAGNOSIS — Z79899 Other long term (current) drug therapy: Secondary | ICD-10-CM | POA: Diagnosis not present

## 2013-06-19 DIAGNOSIS — J301 Allergic rhinitis due to pollen: Secondary | ICD-10-CM | POA: Diagnosis not present

## 2013-06-19 DIAGNOSIS — R05 Cough: Secondary | ICD-10-CM | POA: Diagnosis not present

## 2013-06-19 DIAGNOSIS — E119 Type 2 diabetes mellitus without complications: Secondary | ICD-10-CM | POA: Diagnosis not present

## 2013-06-19 DIAGNOSIS — R7309 Other abnormal glucose: Secondary | ICD-10-CM | POA: Diagnosis not present

## 2013-06-19 DIAGNOSIS — Z1212 Encounter for screening for malignant neoplasm of rectum: Secondary | ICD-10-CM | POA: Diagnosis not present

## 2013-06-19 DIAGNOSIS — Z23 Encounter for immunization: Secondary | ICD-10-CM | POA: Diagnosis not present

## 2013-06-19 DIAGNOSIS — I10 Essential (primary) hypertension: Secondary | ICD-10-CM | POA: Diagnosis not present

## 2013-06-19 DIAGNOSIS — E782 Mixed hyperlipidemia: Secondary | ICD-10-CM | POA: Diagnosis not present

## 2013-06-19 DIAGNOSIS — E559 Vitamin D deficiency, unspecified: Secondary | ICD-10-CM | POA: Diagnosis not present

## 2013-07-14 ENCOUNTER — Other Ambulatory Visit: Payer: Self-pay | Admitting: Internal Medicine

## 2013-07-29 ENCOUNTER — Encounter: Payer: Self-pay | Admitting: Internal Medicine

## 2013-07-30 ENCOUNTER — Encounter: Payer: Self-pay | Admitting: Emergency Medicine

## 2013-07-30 ENCOUNTER — Ambulatory Visit (INDEPENDENT_AMBULATORY_CARE_PROVIDER_SITE_OTHER): Payer: Medicare Other | Admitting: Emergency Medicine

## 2013-07-30 VITALS — BP 112/70 | HR 76 | Temp 98.4°F | Resp 18 | Wt 231.0 lb

## 2013-07-30 DIAGNOSIS — J40 Bronchitis, not specified as acute or chronic: Secondary | ICD-10-CM | POA: Diagnosis not present

## 2013-07-30 DIAGNOSIS — J329 Chronic sinusitis, unspecified: Secondary | ICD-10-CM

## 2013-07-30 DIAGNOSIS — J309 Allergic rhinitis, unspecified: Secondary | ICD-10-CM | POA: Diagnosis not present

## 2013-07-30 DIAGNOSIS — E1169 Type 2 diabetes mellitus with other specified complication: Secondary | ICD-10-CM | POA: Insufficient documentation

## 2013-07-30 DIAGNOSIS — G4733 Obstructive sleep apnea (adult) (pediatric): Secondary | ICD-10-CM | POA: Insufficient documentation

## 2013-07-30 DIAGNOSIS — E349 Endocrine disorder, unspecified: Secondary | ICD-10-CM | POA: Insufficient documentation

## 2013-07-30 DIAGNOSIS — M109 Gout, unspecified: Secondary | ICD-10-CM | POA: Insufficient documentation

## 2013-07-30 MED ORDER — PREDNISONE 10 MG PO TABS: ORAL_TABLET | ORAL | Status: DC

## 2013-07-30 MED ORDER — HYDROCODONE-ACETAMINOPHEN 5-325 MG PO TABS: ORAL_TABLET | ORAL | Status: DC

## 2013-07-30 MED ORDER — AZITHROMYCIN 250 MG PO TABS: ORAL_TABLET | ORAL | Status: AC

## 2013-07-30 MED ORDER — BENZONATATE 100 MG PO CAPS: 100.0000 mg | ORAL_CAPSULE | Freq: Three times a day (TID) | ORAL | Status: DC | PRN

## 2013-07-30 NOTE — Patient Instructions (Signed)
Bronchitis Bronchitis is a problem of the air tubes leading to your lungs. This problem makes it hard for air to get in and out of the lungs. You may cough a lot because your air tubes are narrow. Going without care can cause lasting (chronic) bronchitis. HOME CARE   Drink enough fluids to keep your pee (urine) clear or pale yellow.  Use a cool mist humidifier.  Quit smoking if you smoke. If you keep smoking, the bronchitis might not get better.  Only take medicine as told by your doctor. GET HELP RIGHT AWAY IF:   Coughing keeps you awake.  You start to wheeze.  You become more sick or weak.  You have a hard time breathing or get short of breath.  You cough up blood.  Coughing lasts more than 2 weeks.  You have a fever.  Your baby is older than 3 months with a rectal temperature of 102 F (38.9 C) or higher.  Your baby is 81 months old or younger with a rectal temperature of 100.4 F (38 C) or higher. MAKE SURE YOU:  Understand these instructions.  Will watch your condition.  Will get help right away if you are not doing well or get worse. Document Released: 01/24/2008 Document Revised: 10/30/2011 Document Reviewed: 04/01/2013 [facility] Patient Information 2014 [place], [place]. Allergic Rhinitis Allergic rhinitis is when the mucous membranes in the nose respond to allergens. Allergens are particles in the air that cause your body to have an allergic reaction. This causes you to release allergic antibodies. Through a chain of events, these eventually cause you to release histamine into the blood stream (hence the use of antihistamines). Although meant to be protective to the body, it is this release that causes your discomfort, such as frequent sneezing, congestion and an itchy runny nose.  CAUSES  The pollen allergens may come from grasses, trees, and weeds. This is seasonal allergic rhinitis, or "hay fever." Other allergens cause year-round allergic rhinitis (perennial  allergic rhinitis) such as house dust mite allergen, pet dander and mold spores.  SYMPTOMS   Nasal stuffiness (congestion).  Runny, itchy nose with sneezing and tearing of the eyes.  There is often an itching of the mouth, eyes and ears. It cannot be cured, but it can be controlled with medications. DIAGNOSIS  If you are unable to determine the offending allergen, skin or blood testing may find it. TREATMENT   Avoid the allergen.  Medications and allergy shots (immunotherapy) can help.  Hay fever may often be treated with antihistamines in pill or nasal spray forms. Antihistamines block the effects of histamine. There are over-the-counter medicines that may help with nasal congestion and swelling around the eyes. Check with your caregiver before taking or giving this medicine. If the treatment above does not work, there are many new medications your caregiver can prescribe. Stronger medications may be used if initial measures are ineffective. Desensitizing injections can be used if medications and avoidance fails. Desensitization is when a patient is given ongoing shots until the body becomes less sensitive to the allergen. Make sure you follow up with your caregiver if problems continue. SEEK MEDICAL CARE IF:   You develop fever (more than 100.5 F (38.1 C).  You develop a cough that does not stop easily (persistent).  You have shortness of breath.  You start wheezing.  Symptoms interfere with normal daily activities. Document Released: 05/02/2001 Document Revised: 10/30/2011 Document Reviewed: 11/11/2008 [facility] Patient Information 2014 [place], [place].

## 2013-07-31 NOTE — Progress Notes (Signed)
Subjective:    Patient ID: Jeffery Reed, male    DOB: [birth year], 67 y.o.   MRN: [id number]  HPI Comments: 67 yo male with cough and  on/ off x 3 weeks. He has increased production with color x 2 days. He is now hoarse and OTC have not helped.  Headache  Associated symptoms include coughing, sinus pressure and a sore throat.  Sinusitis Associated symptoms include congestion, coughing, headaches, sinus pressure and a sore throat.    Current Outpatient Prescriptions on File Prior to Visit  Medication Sig Dispense Refill  . ALPRAZolam (XANAX) 1 MG tablet 1 mg 2 (two) times daily as needed.       Marland Kitchen aspirin 81 MG tablet Take 81 mg by mouth daily.      . colchicine 0.6 MG tablet Take 0.6 mg by mouth daily.      . fenofibrate micronized (LOFIBRA) 134 MG capsule       . losartan-hydrochlorothiazide (HYZAAR) 100-25 MG per tablet Take 1 tablet by mouth daily.      . metFORMIN (GLUCOPHAGE-XR) 500 MG 24 hr tablet Take 500 mg by mouth. Take 4 tablets daily.      Marland Kitchen omeprazole (PRILOSEC) 40 MG capsule       . oxybutynin (DITROPAN) 5 MG tablet Take 5 mg by mouth 2 (two) times daily.      . pravastatin (PRAVACHOL) 40 MG tablet TAKE 1 TABLET BY MOUTH AT BEDTIME FOR CHOLESTEROL  90 tablet  1  . Vitamin D, Ergocalciferol, (DRISDOL) 50000 UNITS CAPS Take 50,000 Units by mouth.      . Magnesium 500 MG TABS Take 500 mg by mouth daily.      . ranitidine (ZANTAC) 300 MG capsule Take 300 mg by mouth as needed for heartburn.       No current facility-administered medications on file prior to visit.   ALLERGIES Cardura; Codeine; and Hytrin  Past Medical History  Diagnosis Date  . Hypertension   . Hyperlipidemia   . Pre-diabetes   . Hypogonadism male   . Thyroid disease     hypothyroid  . Gout   . OSA (obstructive sleep apnea)      Review of Systems  HENT: Positive for congestion, sinus pressure and sore throat.   Respiratory: Positive for cough.   Neurological: Positive for headaches.   All other systems reviewed and are negative.    BP 112/70  Pulse 76  Temp(Src) 98.4 F (36.9 C) (Temporal)  Resp 18  Wt 231 lb (104.781 kg)     Objective:   Physical Exam  Nursing note and vitals reviewed. Constitutional: He is oriented to person, place, and time. He appears well-developed and well-nourished.  HENT:  Head: Normocephalic and atraumatic.  Right Ear: External ear normal.  Left Ear: External ear normal.  Nose: Nose normal.  Mouth/Throat: Oropharynx is clear and moist. No oropharyngeal exudate.  Frontal tenderness, yellow tms  Eyes: Conjunctivae are normal.  Neck: Normal range of motion.  Cardiovascular: Normal rate, regular rhythm, normal heart sounds and intact distal pulses.   Pulmonary/Chest: Effort normal. He has wheezes.  Congestion clears with cough  Abdominal: Soft.  Musculoskeletal: Normal range of motion.  Lymphadenopathy:    He has no cervical adenopathy.  Neurological: He is alert and oriented to person, place, and time.  Skin: Skin is warm and dry.  Psychiatric: He has a normal mood and affect. Judgment normal.          Assessment & Plan:Bronchitis  Bronchitis /Sinusitis/ Allergic Rhinitis- Z-pak, Pred DP 10 mg, Allegra, Tessalon Perles, All AD, increase H2o

## 2013-08-06 DIAGNOSIS — N318 Other neuromuscular dysfunction of bladder: Secondary | ICD-10-CM | POA: Diagnosis not present

## 2013-08-06 DIAGNOSIS — K4090 Unilateral inguinal hernia, without obstruction or gangrene, not specified as recurrent: Secondary | ICD-10-CM | POA: Diagnosis not present

## 2013-08-06 DIAGNOSIS — R350 Frequency of micturition: Secondary | ICD-10-CM | POA: Diagnosis not present

## 2013-08-06 DIAGNOSIS — N3941 Urge incontinence: Secondary | ICD-10-CM | POA: Diagnosis not present

## 2013-08-17 ENCOUNTER — Encounter: Payer: Self-pay | Admitting: Internal Medicine

## 2013-08-17 DIAGNOSIS — E559 Vitamin D deficiency, unspecified: Secondary | ICD-10-CM | POA: Insufficient documentation

## 2013-08-17 DIAGNOSIS — E1122 Type 2 diabetes mellitus with diabetic chronic kidney disease: Secondary | ICD-10-CM

## 2013-08-17 DIAGNOSIS — I10 Essential (primary) hypertension: Secondary | ICD-10-CM | POA: Insufficient documentation

## 2013-08-17 HISTORY — DX: Type 2 diabetes mellitus with diabetic chronic kidney disease: E11.22

## 2013-08-17 NOTE — Progress Notes (Signed)
Patient ID: Jeffery Reed, male   DOB: 04-01-1946, 67 y.o.   MRN: [id number]  Annual Screening Comprehensive Examination  This very nice 67 y.o.  MWM presents for complete physical.  Patient has been followed for HTN, Diabetes, Gout, Testosterone Deficiency, Hyperlipidemia, and Vitamin D Deficiency.   Patient's HTN predates since 1997. Patient's BP has been controlled at home. Today's BP is 124/80. Patient denies any cardiac symptoms as chest pain, palpitations, shortness of breath, dizziness or ankle swelling.   Patient's hyperlipidemia is controlled with diet and medications. Patient denies myalgias or other medication SE's. Last cholesterol last visit was 186, elevated triglycerides at 256, low HDL 31 and LDL 104 - near goal.    Patient has T2 Diabetes predating from 2005 with last A1c 7.4% in Oct 2014. Patient reports glucoses range less than 130's. Patient denies reactive hypoglycemic symptoms, visual blurring, diabetic polys, or paresthesias.   Also, he has OSA and is on CPAP at 6 cm. In 03/2012 he was found to have transient low thyroid -possibly thyroiditis with normal TSH's since. [place] one week ago, he was seen by Dr Retta Diones for prostatism and LUTS and had both pre and post voiding bladder ultrasounds  and was prescribed Rapaflo to compliment the Oxybutynin that he was already on for OAB Sx's and Hx/o incontinence (for which he wears diapers). Finally, patient has history of Vitamin D Deficiency with last vitamin D 61 in Oct (was 22 in 2008).      Current Outpatient Prescriptions on File Prior to Visit  Medication Sig Dispense Refill  . ALPRAZolam (XANAX) 1 MG tablet 1 mg 2 (two) times daily as needed.       . fenofibrate micronized (LOFIBRA) 134 MG capsule       . losartan-hydrochlorothiazide (HYZAAR) 100-25 MG per tablet Take 1 tablet by mouth daily.      . metFORMIN (GLUCOPHAGE-XR) 500 MG 24 hr tablet Take 500 mg by mouth. Take 4 tablets daily.      Marland Kitchen omeprazole (PRILOSEC) 40  MG capsule       . oxybutynin (DITROPAN) 5 MG tablet Take 5 mg by mouth 2 (two) times daily.      . pravastatin (PRAVACHOL) 40 MG tablet TAKE 1 TABLET BY MOUTH AT BEDTIME FOR CHOLESTEROL  90 tablet  1   No current facility-administered medications on file prior to visit.    Allergies  Allergen Reactions  . Cardura [Doxazosin Mesylate] Other (See Comments)    Nasal congestion  . Codeine Other (See Comments)    Pt passes out.  Marland Kitchen Hytrin [Terazosin]     Past Medical History  Diagnosis Date  . Hypertension   . Hyperlipidemia   . Pre-diabetes   . Hypogonadism male   . Thyroid disease     hypothyroid  . Gout   . OSA (obstructive sleep apnea)     Past Surgical History  Procedure Laterality Date  . Foot surgery  10/2009  . Tonsillectomy  1953  . Myringotomy Left 02-2005    Dr.Newman    Family History  Problem Relation Age of Onset  . Hypertension Mother   . GER disease Mother   . Hyperlipidemia Father   . Hypertension Father   . Parkinson's disease Father   . Hypertension Brother   . Diabetes Maternal Grandmother   . Stroke Paternal Grandmother   . Diabetes Paternal Grandmother   . Stroke Paternal Grandfather     History   Social History  . Marital Status: Married  Spouse Name: N/A    Number of Children: N/A  . Years of Education: N/A   Occupational History  . 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 Smoking status: Never Smoker   . Smokeless tobacco: Not on file  . Alcohol Use: No  . Drug Use: No  . Sexual Activity: Not on file      ROS Constitutional: Denies fever, chills, weight loss/gain, headaches, insomnia, fatigue, night sweats, and change in appetite. Eyes: Denies redness, blurred vision, diplopia, discharge, itchy, watery eyes.  ENT: c/o sinus congestion and Sinus pain,, post nasal drip, sore throat, earache, but no hearing loss, dental pain,epistaxis, tinnitus, vertigo. Cardio: Denies chest pain,  palpitations, irregular heartbeat, syncope, dyspnea, diaphoresis, orthopnea, PND, claudication, edema Respiratory: c/o cough, hoarseness, laryngitis - but no dyspnea, DOE, pleurisy, wheezing.  Gastrointestinal: Denies dysphagia, heartburn, reflux, water brash, pain, cramps, nausea, vomiting, bloating, diarrhea, constipation, hematemesis, melena, hematochezia, jaundice, hemorrhoids Genitourinary: Denies dysuria,  +frequency, +urgency, +nocturia x 3-4, +hesitancy, -discharge, -hematuria, -flank pain Musculoskeletal: Denies arthralgia, myalgia, stiffness, Jt. Swelling, pain, limp, and strain/sprain. Skin: Denies puritis, rash, hives, warts, acne, eczema, changing in skin lesion Neuro: No weakness, tremor, incoordination, spasms, paresthesia, pain Psychiatric: Denies confusion, memory loss, sensory loss Endocrine: Denies change in weight, skin, hair change, nocturia, and paresthesia, diabetic polys, visual blurring, hyper / hypo glycemic episodes.  Heme/Lymph: No excessive bleeding, bruising, or elarged lymph nodes.  BP: 124/80  Pulse: 72  Temp: 97.9 F (36.6 C)  Resp: 16    Estimated body mass index is 31.51 kg/(m^2) as calculated from the following:   Height as of this encounter: 5' 10.5" (1.791 m).   Weight as of this encounter: 222 lb 12.8 oz (101.061 kg).  Physical Exam General Appearance: Well nourished, in no apparent distress. Eyes: PERRLA, EOMs, conjunctiva no swelling or erythema, normal fundi and vessels. Sinuses: has frontal/maxillary tenderness ENT/Mouth: EACs patent / TMs  sl retracted. Nares clear without erythema, swelling, mucoid exudates. Oral hygiene is good. No erythema, swelling, or exudate. Tongue normal, non-obstructing. Tonsils not swollen or erythematous. Hearing normal.  Neck: Supple, thyroid normal. No bruits, nodes or JVD. Respiratory: Respiratory effort normal.  BS equal with scattered  rales, rhonchi, but no wheezing or stridor. Cardio: Heart sounds are normal  with regular rate and rhythm and no murmurs, rubs or gallops. Peripheral pulses are normal and equal bilaterally without edema. No aortic or femoral bruits. Chest: symmetric with normal excursions and percussion.  Abdomen: Flat, soft, with bowl sounds. Nontender, no guarding, rebound, hernias, masses, or organomegaly.  Lymphatics: Non tender without lymphadenopathy.  Genitourinary: No hernias.Testes nl. DRE - prostate sl enlarged for age - but smooth & firm w/o nodules. Musculoskeletal: Full ROM all peripheral extremities, joint stability, 5/5 strength, and normal gait. Skin: Warm and dry without rashes, lesions, cyanosis, clubbing or  ecchymosis.  Neuro: Cranial nerves intact, reflexes equal bilaterally. Normal muscle tone, no cerebellar symptoms. Sensation intact.  Pysch: Awake and oriented X 3, normal affect, insight and judgment appropriate.   Assessment and Plan  1. Annual Screening Examination 2. Hypertension  3. Hyperlipidemia 4. Pre Diabetes 5. Vitamin D Deficiency 6. Gout 7. Testosterone Deficiency 8. BPH/Prostatism/LUTS - continue F/U with Urology 9. Sinusitis & Tracheitis - Rx Levaquin  Continue prudent diet as discussed, weight control, BP monitoring, regular exercise, and medications as discussed.  Discussed med effects and SE's. Routine screening labs and tests as requested with regular follow-up as recommended.

## 2013-08-17 NOTE — Patient Instructions (Signed)

## 2013-08-18 ENCOUNTER — Ambulatory Visit (INDEPENDENT_AMBULATORY_CARE_PROVIDER_SITE_OTHER): Payer: Medicare Other | Admitting: Internal Medicine

## 2013-08-18 ENCOUNTER — Encounter: Payer: Self-pay | Admitting: Internal Medicine

## 2013-08-18 VITALS — BP 124/80 | HR 72 | Temp 97.9°F | Resp 16 | Ht 70.5 in | Wt 222.8 lb

## 2013-08-18 DIAGNOSIS — E559 Vitamin D deficiency, unspecified: Secondary | ICD-10-CM

## 2013-08-18 DIAGNOSIS — N529 Male erectile dysfunction, unspecified: Secondary | ICD-10-CM

## 2013-08-18 DIAGNOSIS — Z79899 Other long term (current) drug therapy: Secondary | ICD-10-CM | POA: Diagnosis not present

## 2013-08-18 DIAGNOSIS — Z125 Encounter for screening for malignant neoplasm of prostate: Secondary | ICD-10-CM

## 2013-08-18 DIAGNOSIS — I10 Essential (primary) hypertension: Secondary | ICD-10-CM

## 2013-08-18 DIAGNOSIS — Z0000 Encounter for general adult medical examination without abnormal findings: Secondary | ICD-10-CM

## 2013-08-18 DIAGNOSIS — Z1212 Encounter for screening for malignant neoplasm of rectum: Secondary | ICD-10-CM | POA: Diagnosis not present

## 2013-08-18 DIAGNOSIS — E782 Mixed hyperlipidemia: Secondary | ICD-10-CM | POA: Diagnosis not present

## 2013-08-18 DIAGNOSIS — E119 Type 2 diabetes mellitus without complications: Secondary | ICD-10-CM

## 2013-08-18 DIAGNOSIS — M109 Gout, unspecified: Secondary | ICD-10-CM | POA: Diagnosis not present

## 2013-08-18 DIAGNOSIS — J019 Acute sinusitis, unspecified: Secondary | ICD-10-CM

## 2013-08-18 DIAGNOSIS — J0410 Acute tracheitis without obstruction: Secondary | ICD-10-CM

## 2013-08-18 DIAGNOSIS — J209 Acute bronchitis, unspecified: Secondary | ICD-10-CM

## 2013-08-18 LAB — BASIC METABOLIC PANEL WITH GFR
CO2: 28 mEq/L (ref 19–32)
Calcium: 9.9 mg/dL (ref 8.4–10.5)
Creat: 1.38 mg/dL — ABNORMAL HIGH (ref 0.50–1.35)
GFR, Est African American: 61 mL/min
GFR, Est Non African American: 53 mL/min — ABNORMAL LOW
Potassium: 4.5 mEq/L (ref 3.5–5.3)
Sodium: 141 mEq/L (ref 135–145)

## 2013-08-18 LAB — CBC WITH DIFFERENTIAL/PLATELET
Basophils Absolute: 0 10*3/uL (ref 0.0–0.1)
Eosinophils Relative: 2 % (ref 0–5)
HCT: 41.8 % (ref 39.0–52.0)
Hemoglobin: 14.7 g/dL (ref 13.0–17.0)
Lymphocytes Relative: 23 % (ref 12–46)
Lymphs Abs: 1.6 10*3/uL (ref 0.7–4.0)
MCV: 83.8 fL (ref 78.0–100.0)
Monocytes Absolute: 0.6 10*3/uL (ref 0.1–1.0)
Monocytes Relative: 10 % (ref 3–12)
Neutro Abs: 4.3 10*3/uL (ref 1.7–7.7)
Platelets: 208 10*3/uL (ref 150–400)
RBC: 4.99 MIL/uL (ref 4.22–5.81)
RDW: 13.9 % (ref 11.5–15.5)
WBC: 6.6 10*3/uL (ref 4.0–10.5)

## 2013-08-18 LAB — HEPATIC FUNCTION PANEL
ALT: 19 U/L (ref 0–53)
Albumin: 4.7 g/dL (ref 3.5–5.2)
Alkaline Phosphatase: 47 U/L (ref 39–117)
Bilirubin, Direct: 0.1 mg/dL (ref 0.0–0.3)
Total Bilirubin: 0.7 mg/dL (ref 0.3–1.2)

## 2013-08-18 LAB — HEMOGLOBIN A1C: Mean Plasma Glucose: 143 mg/dL — ABNORMAL HIGH (ref <117)

## 2013-08-18 LAB — LIPID PANEL
Cholesterol: 178 mg/dL (ref 0–200)
HDL: 34 mg/dL — ABNORMAL LOW (ref >39)
LDL Cholesterol: 108 mg/dL — ABNORMAL HIGH (ref 0–99)
Total CHOL/HDL Ratio: 5.2 Ratio

## 2013-08-18 LAB — TSH: TSH: 2.492 u[IU]/mL (ref 0.350–4.500)

## 2013-08-18 LAB — PSA: PSA: 0.6 ng/mL (ref <=4.00)

## 2013-08-18 LAB — URIC ACID: Uric Acid, Serum: 5.7 mg/dL (ref 4.0–7.8)

## 2013-08-18 LAB — MAGNESIUM: Magnesium: 1.8 mg/dL (ref 1.5–2.5)

## 2013-08-18 MED ORDER — LEVOFLOXACIN 500 MG PO TABS: ORAL_TABLET | ORAL | Status: AC

## 2013-08-18 MED ORDER — ASPIRIN 81 MG PO CHEW: 81.0000 mg | CHEWABLE_TABLET | Freq: Every day | ORAL | Status: AC

## 2013-08-19 LAB — MICROALBUMIN / CREATININE URINE RATIO
Creatinine, Urine: 89.7 mg/dL
Microalb, Ur: 0.5 mg/dL (ref 0.00–1.89)

## 2013-08-19 LAB — INSULIN, FASTING: Insulin fasting, serum: 19 u[IU]/mL (ref 3–28)

## 2013-08-19 LAB — VITAMIN D 25 HYDROXY (VIT D DEFICIENCY, FRACTURES): Vit D, 25-Hydroxy: 68 ng/mL (ref 30–89)

## 2013-08-19 LAB — URINALYSIS, MICROSCOPIC ONLY
Casts: NONE SEEN
Squamous Epithelial / LPF: NONE SEEN

## 2013-09-05 DIAGNOSIS — R350 Frequency of micturition: Secondary | ICD-10-CM | POA: Diagnosis not present

## 2013-09-05 DIAGNOSIS — N3941 Urge incontinence: Secondary | ICD-10-CM | POA: Diagnosis not present

## 2013-09-26 DIAGNOSIS — N318 Other neuromuscular dysfunction of bladder: Secondary | ICD-10-CM | POA: Diagnosis not present

## 2013-09-26 DIAGNOSIS — N3941 Urge incontinence: Secondary | ICD-10-CM | POA: Diagnosis not present

## 2013-10-08 DIAGNOSIS — N320 Bladder-neck obstruction: Secondary | ICD-10-CM | POA: Diagnosis not present

## 2013-10-08 DIAGNOSIS — R350 Frequency of micturition: Secondary | ICD-10-CM | POA: Diagnosis not present

## 2013-10-08 DIAGNOSIS — N3941 Urge incontinence: Secondary | ICD-10-CM | POA: Diagnosis not present

## 2013-11-20 ENCOUNTER — Encounter: Payer: Self-pay | Admitting: Physician Assistant

## 2013-11-20 ENCOUNTER — Ambulatory Visit (INDEPENDENT_AMBULATORY_CARE_PROVIDER_SITE_OTHER): Payer: Medicare Other | Admitting: Physician Assistant

## 2013-11-20 VITALS — BP 110/62 | HR 76 | Temp 98.2°F | Resp 16 | Wt 224.0 lb

## 2013-11-20 DIAGNOSIS — E291 Testicular hypofunction: Secondary | ICD-10-CM | POA: Diagnosis not present

## 2013-11-20 DIAGNOSIS — I10 Essential (primary) hypertension: Secondary | ICD-10-CM | POA: Diagnosis not present

## 2013-11-20 DIAGNOSIS — E785 Hyperlipidemia, unspecified: Secondary | ICD-10-CM | POA: Diagnosis not present

## 2013-11-20 DIAGNOSIS — E119 Type 2 diabetes mellitus without complications: Secondary | ICD-10-CM | POA: Diagnosis not present

## 2013-11-20 DIAGNOSIS — Z79899 Other long term (current) drug therapy: Secondary | ICD-10-CM | POA: Diagnosis not present

## 2013-11-20 DIAGNOSIS — E559 Vitamin D deficiency, unspecified: Secondary | ICD-10-CM | POA: Diagnosis not present

## 2013-11-20 DIAGNOSIS — M109 Gout, unspecified: Secondary | ICD-10-CM

## 2013-11-20 LAB — CBC WITH DIFFERENTIAL/PLATELET
BASOS ABS: 0.1 10*3/uL (ref 0.0–0.1)
BASOS PCT: 1 % (ref 0–1)
EOS PCT: 3 % (ref 0–5)
Eosinophils Absolute: 0.2 10*3/uL (ref 0.0–0.7)
HEMATOCRIT: 40.8 % (ref 39.0–52.0)
HEMOGLOBIN: 14 g/dL (ref 13.0–17.0)
Lymphocytes Relative: 24 % (ref 12–46)
Lymphs Abs: 2 10*3/uL (ref 0.7–4.0)
MCH: 28.9 pg (ref 26.0–34.0)
MCHC: 34.3 g/dL (ref 30.0–36.0)
MCV: 84.1 fL (ref 78.0–100.0)
MONO ABS: 0.7 10*3/uL (ref 0.1–1.0)
MONOS PCT: 8 % (ref 3–12)
Neutro Abs: 5.3 10*3/uL (ref 1.7–7.7)
Neutrophils Relative %: 64 % (ref 43–77)
Platelets: 226 10*3/uL (ref 150–400)
RBC: 4.85 MIL/uL (ref 4.22–5.81)
RDW: 14.5 % (ref 11.5–15.5)
WBC: 8.3 10*3/uL (ref 4.0–10.5)

## 2013-11-20 LAB — HEMOGLOBIN A1C
HEMOGLOBIN A1C: 6.2 % — AB (ref <5.7)
Mean Plasma Glucose: 131 mg/dL — ABNORMAL HIGH (ref <117)

## 2013-11-20 MED ORDER — AZITHROMYCIN 250 MG PO TABS: ORAL_TABLET | ORAL | Status: AC

## 2013-11-20 MED ORDER — HYDROCODONE-ACETAMINOPHEN 5-325 MG PO TABS: 1.0000 | ORAL_TABLET | Freq: Four times a day (QID) | ORAL | Status: DC | PRN

## 2013-11-20 NOTE — Progress Notes (Signed)
HPI 68 y.o. male  presents for 3 month follow up with hypertension, hyperlipidemia, diabetes and vitamin D. 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 He denies chest pain, shortness of breath, dizziness.  He is on cholesterol medication and denies myalgias. His cholesterol is not at goal. The cholesterol last visit was:   Lab Results  Component Value Date   CHOL 178 08/18/2013   HDL 34* 08/18/2013   LDLCALC 108* 08/18/2013   TRIG 178* 08/18/2013   CHOLHDL 5.2 08/18/2013   He has been working on diet and exercise for Diabetes, and denies paresthesia of the feet, polydipsia and polyuria. Last A1C in the office was:  Lab Results  Component Value Date   HGBA1C 6.6* 08/18/2013   Patient is on Vitamin D supplement.   He has history of stress/OAB and sees Franchot Gallo, MD and states that he is on ditroban and rapoflo and has not had any accidents, follows up in May.  He states his allergies have been bothering him, he is on allegra and flonase and he is going out of town to [place] [employer] to visit family and would like an ABX incase he gets a cold.  Current Medications:  Current Outpatient Prescriptions on File Prior to Visit  Medication Sig Dispense Refill  . ALPRAZolam (XANAX) 1 MG tablet 1 mg 2 (two) times daily as needed.       Marland Kitchen aspirin (ASPIRIN CHILDRENS) 81 MG chewable tablet Chew 1 tablet (81 mg total) by mouth daily.  36 tablet  11  . chlorpheniramine (CHLOR-TRIMETON) 4 MG tablet Take 4 mg by mouth 2 (two) times daily as needed for allergies.      . Cholecalciferol (VITAMIN D-3) 5000 UNITS TABS Take 5,000 Units by mouth daily.      . fenofibrate micronized (LOFIBRA) 134 MG capsule       . losartan-hydrochlorothiazide (HYZAAR) 100-25 MG per tablet Take 1 tablet by mouth daily.      . metFORMIN (GLUCOPHAGE-XR) 500 MG 24 hr tablet Take 500 mg by mouth. Take  4 tablets daily.      Marland Kitchen omeprazole (PRILOSEC) 40 MG capsule       . oxybutynin (DITROPAN) 5 MG tablet Take 5 mg by mouth 2 (two) times daily.      . pravastatin (PRAVACHOL) 40 MG tablet TAKE 1 TABLET BY MOUTH AT BEDTIME FOR CHOLESTEROL  90 tablet  1  . silodosin (RAPAFLO) 8 MG CAPS capsule Take 8 mg by mouth at bedtime.       No current facility-administered medications on file prior to visit.   Medical History:  Past Medical History  Diagnosis Date  . Hypertension   . Hyperlipidemia   . Pre-diabetes   . Hypogonadism male   . Thyroid disease     hypothyroid  . Gout   . OSA (obstructive sleep apnea)    Allergies:  Allergies  Allergen Reactions  . Cardura [Doxazosin Mesylate] Other (See Comments)    Nasal congestion  . Codeine Other (See Comments)    Pt passes out.  Marland Kitchen Hytrin [Terazosin]      Review of Systems: [X]  = complains of  [ ]  = denies  General: Fatigue [ ]  Fever [ ]  Chills [ ]  Weakness [ ]   Insomnia [ ]  Eyes: Redness [ ]  Blurred vision [ ]  Diplopia [ ]   ENT: Congestion [ ]  Sinus Pain [ ]  Post Nasal Drip [ ]  Sore Throat [ ]  Earache [ ]   Cardiac: Chest pain/pressure [ ]  SOB [ ]  Orthopnea [ ]   Palpitations [ ]   Paroxysmal nocturnal dyspnea[ ]  Claudication [ ]  Edema [ ]   Pulmonary: Cough [ ]  Wheezing[ ]   SOB [ ]   Snoring [ ]   GI: Nausea [ ]  Vomiting[ ]  Dysphagia[ ]  Heartburn[ ]  Abdominal pain [ ]  Constipation [ ] ; Diarrhea [ ] ; BRBPR [ ]  Melena[ ]  GU: Hematuria[ ]  Dysuria [ ]  Nocturia[ ]  Urgency [ ]   Hesitancy [ ]  Discharge [ ]  Neuro: Headaches[ ]  Vertigo[ ]  Paresthesias[ ]  Spasm [ ]  Speech changes [ ]  Incoordination [ ]   Ortho: Arthritis [ ]  Joint pain [ ]  Muscle pain [ ]  Joint swelling [ ]  Back Pain [ ]  Skin:  Rash [ ]   Pruritis [ ]  Change in skin lesion [ ]   Psych: Depression[ ]  Anxiety[ ]  Confusion [ ]  Memory loss [ ]   Heme/Lypmh: Bleeding [ ]  Bruising [ ]  Enlarged lymph nodes [ ]   Endocrine: Visual blurring [ ]  Paresthesia [ ]  Polyuria [ ]  Polydypsea [ ]     Heat/cold intolerance [ ]  Hypoglycemia [ ]   Family history- Review and unchanged Social history- Review and unchanged Physical Exam: BP 110/62  Pulse 76  Temp(Src) 98.2 F (36.8 C)  Resp 16  Wt 224 lb (101.606 kg) Wt Readings from Last 3 Encounters:  11/20/13 224 lb (101.606 kg)  08/18/13 222 lb 12.8 oz (101.061 kg)  07/30/13 231 lb (104.781 kg)   General Appearance: Well nourished, in no apparent distress. Eyes: PERRLA, EOMs, conjunctiva no swelling or erythema Sinuses: No Frontal/maxillary tenderness ENT/Mouth: Ext aud canals clear, TMs without erythema, bulging. No erythema, swelling, or exudate on post pharynx.  Tonsils not swollen or erythematous. Hearing normal.  Neck: Supple, thyroid normal.  Respiratory: Respiratory effort normal, BS equal bilaterally without rales, rhonchi, wheezing or stridor.  Cardio: RRR with no MRGs. Brisk peripheral pulses without edema.  Abdomen: Soft, + BS.  Non tender, no guarding, rebound, hernias, masses. Lymphatics: Non tender without lymphadenopathy.  Musculoskeletal: Full ROM, 5/5 strength, normal gait.  Skin: Warm, dry without rashes, lesions, ecchymosis.  Neuro: Cranial nerves intact. No cerebellar symptoms. Sensation intact.  Psych: Awake and oriented X 3, normal affect, Insight and Judgment appropriate.   Assessment and Plan:  Hypertension: Continue medication, monitor blood pressure at home. Continue DASH diet. Cholesterol: Continue diet and exercise. Check cholesterol.  Diabetes-Continue diet and exercise. Check A1C Vitamin D Def- check level and continue medications.  Allergies- Norco 5/325, zpak- as needed, long discussion about appropriate use.   Continue diet and meds as discussed. Further disposition pending results of labs. Discussed med's effects and SE's.    Vicie Mutters 10:45 AM

## 2013-11-20 NOTE — Patient Instructions (Signed)
Bad carbs also include fruit juice, alcohol, and sweet tea. These are empty calories that do not signal to your brain that you are full.   Please remember the good carbs are still carbs which convert into sugar. So please measure them out no more than 1/2-1 cup of rice, oatmeal, pasta, and beans.  Veggies are however free foods! Pile them on.   I like lean protein at every meal such as chicken, [place], pork chops, cottage cheese, etc. Just do not fry these meats and please center your meal around vegetable, the meats should be a side dish.   No all fruit is created equal. Please see the list below, the fruit at the bottom is higher in sugars than the fruit at the top   The majority of colds are caused by viruses and do not require antibiotics. Please read the rest of this hand out to learn more about the common cold and what you can do to help yourself as well as help prevent the over use of antibiotics.   COMMON COLD SIGNS AND SYMPTOMS - The common cold usually causes nasal congestion, runny nose, and sneezing. A sore throat may be present on the first day but usually resolves quickly. If a cough occurs, it generally develops on about the fourth or fifth day of symptoms, typically when congestion and runny nose are resolving  COMMON COLD COMPLICATIONS - In most cases, colds do not cause serious illness or complications. Most colds last for three to seven days, although many people continue to have symptoms (coughing, sneezing, congestion) for up to two weeks.  One of the more common complications is sinusitis, which is usually caused by viruses and rarely (about 2 percent of the time) by bacteria. Having thick or yellow to green-colored nasal discharge does not mean that bacterial sinusitis has developed; discolored nasal discharge is a normal phase of the common cold.  Lower respiratory infections, such as pneumonia or bronchitis, may develop following a cold.  Infection of the middle ear, or  otitis media, can accompany or follow a cold.  COMMON COLD TREATMENT - There is no specific treatment for the viruses that cause the common cold. Most treatments are aimed at relieving some of the symptoms of the cold, but do not shorten or cure the cold. Antibiotics are not useful for treating the common cold; antibiotics are only used to treat illnesses caused by bacteria, not viruses. Unnecessary use of antibiotics for the treatment of the common cold can cause allergic reactions, diarrhea, or other gastrointestinal symptoms in some patients.  The symptoms of a cold will resolve over time, even without any treatment. People with underlying medical conditions and those who use other over-the-counter or prescription medications should speak with their healthcare provider or pharmacist to ensure that it is safe to use these treatments. The following are treatments that may reduce the symptoms caused by the common cold.  Nasal congestion - Decongestants are good for nasal congestion- if you feel very stuffy but no mucus is coming out, this is the medication that will help you the most.  Pseudoephedrine is a decongestant that can improve nasal congestion. Although a prescription is not required, drugstores in the [place] keep pseudoephedrine behind the counter, so it must be requested from a pharmacist. If you have a heart condition or high blood pressure please use Coricidin BPH instead.   Runny nose - Antihistamines such as diphenhydramine (Benadryl), certazine (Zyrtec) which are best taking at night because they  can make you tired OR loratadine (Claritin),  fexafinadine (Allegra) help with a runny nose.   Nasal sprays such an oxymetazoline (Afrin and others) may also give temporary relief of nasal congestion. However, these sprays should never be used for more than two to three days; use for more than three days use can worsen congestion.  Nasocort is now over the counter and can help decrease a  runny nose. Please stop the medication if you have blurry vision or nose bleeds.   Sore throat and headache - Sore throat and headache are best treated with a mild pain reliever such as acetaminophen (Tylenol) or a non-steroidal anti-inflammatory agent such as ibuprofen or naproxen (Motrin or Aleve). These medications should be taken with food to prevent stomach problems. As well as gargling with warm water and salt.   Cough - Common cough medicine ingredients include guaifenesin and dextromethorphan; these are often combined with other medications in over-the-counter cold formulas. Often a cough is worse at night or first in the morning due to post nasal drip from you nose. You can try to sleep at an angle to decrease a cough.   Alternative treatments - Heated, humidified air can improve symptoms of nasal congestion and runny nose, and causes few to no side effects. A number of alternative products, including vitamin C, doubling up on your vitamin D and herbal products such as echinacea, may help. Certain products, such as nasal gels that contain zinc (eg, Zicam), have been associated with a permanent loss of smell.  Antibiotics - Antibiotics should not be used to treat an uncomplicated common cold. As noted above, colds are caused by viruses. Antibiotics treat bacterial, not viral infections. Some viruses that cause the common cold can also depress the immune system or cause swelling in the lining of the nose or airways; this can, in turn, lead to a bacterial infection. Often you need to give your body 7 days to fight off a common cold while treating the symptoms with the medications listed above. If after 7 days your symptoms are not improving, you are getting worse, you have shortness of breath, chest pain, a fever of over 103 you should seek medical help immediately.   PREVENTION IS THE BEST MEDICINE - Hand washing is an essential and highly effective way to prevent the spread of infection.    Alcohol-based hand rubs are a good alternative for disinfecting hands if a sink is not available.  Hands should be washed before preparing food and eating and after coughing, blowing the nose, or sneezing. While it is not always possible to limit contact with people who may be infected with a cold, touching the eyes, nose, or mouth after direct contact should be avoided when possible. Sneezing/coughing into the sleeve of one's clothing (at the inner elbow) is another means of containing sprays of saliva and secretions and does not contaminate the hands.

## 2013-11-21 LAB — HEPATIC FUNCTION PANEL
ALBUMIN: 4.3 g/dL (ref 3.5–5.2)
ALK PHOS: 44 U/L (ref 39–117)
ALT: 19 U/L (ref 0–53)
AST: 25 U/L (ref 0–37)
Bilirubin, Direct: 0.1 mg/dL (ref 0.0–0.3)
Indirect Bilirubin: 0.5 mg/dL (ref 0.2–1.2)
TOTAL PROTEIN: 6.7 g/dL (ref 6.0–8.3)
Total Bilirubin: 0.6 mg/dL (ref 0.2–1.2)

## 2013-11-21 LAB — BASIC METABOLIC PANEL WITH GFR
BUN: 17 mg/dL (ref 6–23)
CHLORIDE: 100 meq/L (ref 96–112)
CO2: 28 mEq/L (ref 19–32)
CREATININE: 1.25 mg/dL (ref 0.50–1.35)
Calcium: 9.5 mg/dL (ref 8.4–10.5)
GFR, EST AFRICAN AMERICAN: 69 mL/min
GFR, EST NON AFRICAN AMERICAN: 60 mL/min
GLUCOSE: 90 mg/dL (ref 70–99)
POTASSIUM: 4.5 meq/L (ref 3.5–5.3)
Sodium: 141 mEq/L (ref 135–145)

## 2013-11-21 LAB — MAGNESIUM: MAGNESIUM: 1.8 mg/dL (ref 1.5–2.5)

## 2013-11-21 LAB — LIPID PANEL
Cholesterol: 151 mg/dL (ref 0–200)
HDL: 33 mg/dL — AB (ref >39)
LDL Cholesterol: 86 mg/dL (ref 0–99)
Total CHOL/HDL Ratio: 4.6 Ratio
Triglycerides: 160 mg/dL — ABNORMAL HIGH (ref <150)
VLDL: 32 mg/dL (ref 0–40)

## 2013-11-21 LAB — VITAMIN D 25 HYDROXY (VIT D DEFICIENCY, FRACTURES): VIT D 25 HYDROXY: 44 ng/mL (ref 30–89)

## 2013-11-21 LAB — TSH: TSH: 2.395 u[IU]/mL (ref 0.350–4.500)

## 2013-11-21 LAB — INSULIN, FASTING: INSULIN FASTING, SERUM: 9 u[IU]/mL (ref 3–28)

## 2013-12-10 DIAGNOSIS — G4733 Obstructive sleep apnea (adult) (pediatric): Secondary | ICD-10-CM | POA: Diagnosis not present

## 2013-12-10 DIAGNOSIS — H6120 Impacted cerumen, unspecified ear: Secondary | ICD-10-CM | POA: Diagnosis not present

## 2013-12-10 DIAGNOSIS — J322 Chronic ethmoidal sinusitis: Secondary | ICD-10-CM | POA: Diagnosis not present

## 2013-12-17 ENCOUNTER — Ambulatory Visit
Admission: RE | Admit: 2013-12-17 | Discharge: 2013-12-17 | Disposition: A | Discharge status: Home / Self Care | Payer: Medicare Other | Source: Ambulatory Visit | Attending: Otolaryngology | Admitting: Otolaryngology

## 2013-12-17 ENCOUNTER — Other Ambulatory Visit: Payer: Self-pay | Admitting: Otolaryngology

## 2013-12-17 DIAGNOSIS — J3489 Other specified disorders of nose and nasal sinuses: Secondary | ICD-10-CM | POA: Diagnosis not present

## 2013-12-17 DIAGNOSIS — J329 Chronic sinusitis, unspecified: Secondary | ICD-10-CM

## 2013-12-17 IMAGING — CT CT PARANASAL SINUSES LIMITED
1 series · 10 of 12 positions shown, 13 images · non-contrast
Comparison: None.

CLINICAL DATA: Multiple allergies with sinusitis. Finished
antibiotics.

EXAM:
CT PARANASAL SINUS WITHOUT CONTRAST
TECHNIQUE: Multidetector CT images of the paranasal sinuses were obtained using
the standard protocol without intravenous contrast.

[Series 3: coronal soft · axial · 0.33mm/px · z∈[+34,+124]mm · 10 of 12 slices shown, 13 images]
[im 2/12  brain]
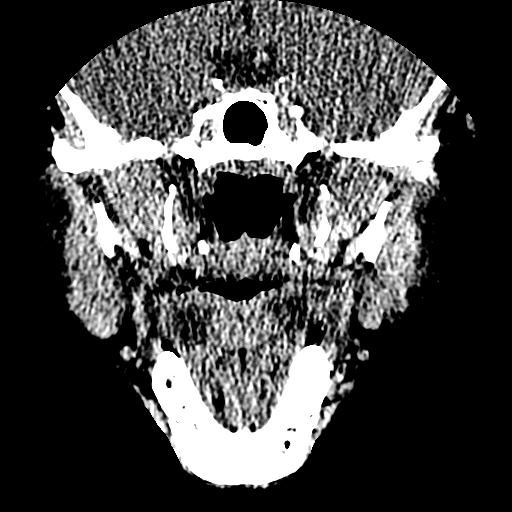
[im 2/12  bone]
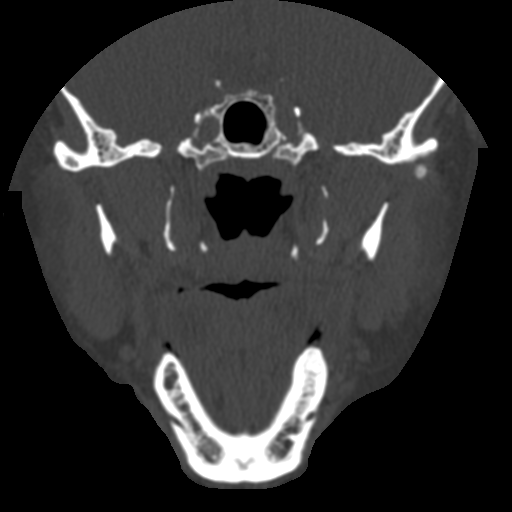
[im 3/12  bone]
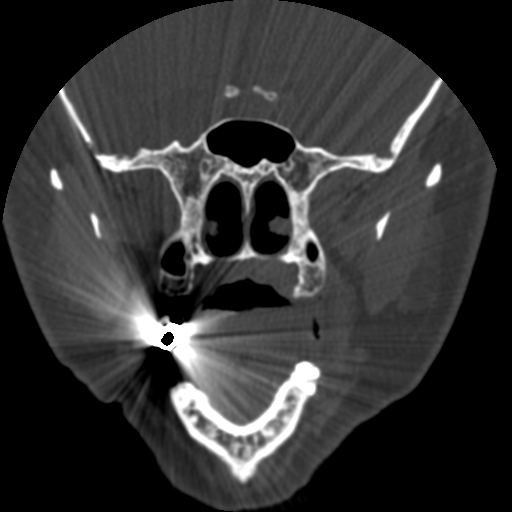
[im 4/12  bone]
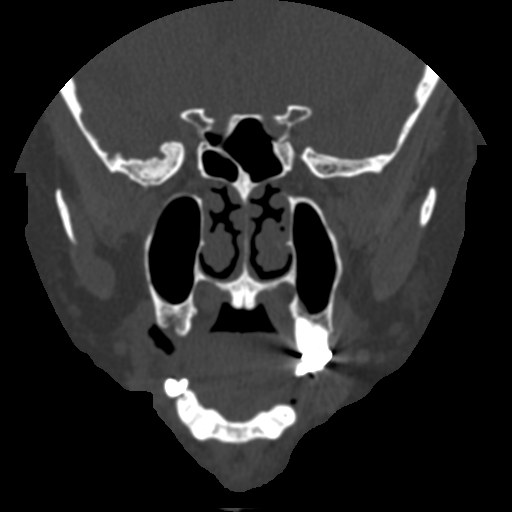
[im 5/12  bone]
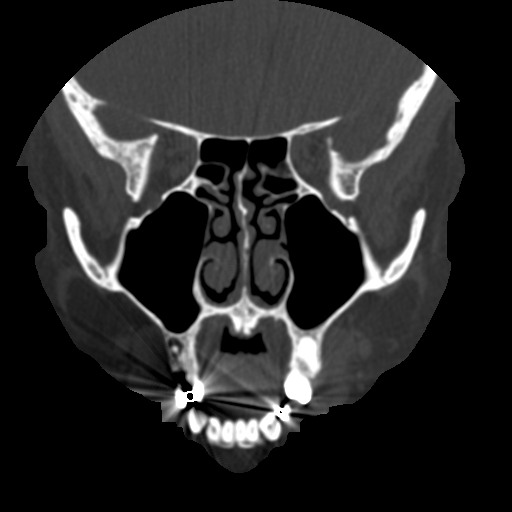
[im 6/12  brain]
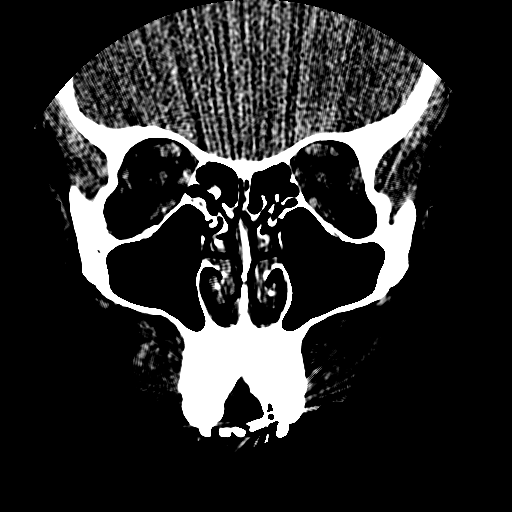
[im 6/12  bone]
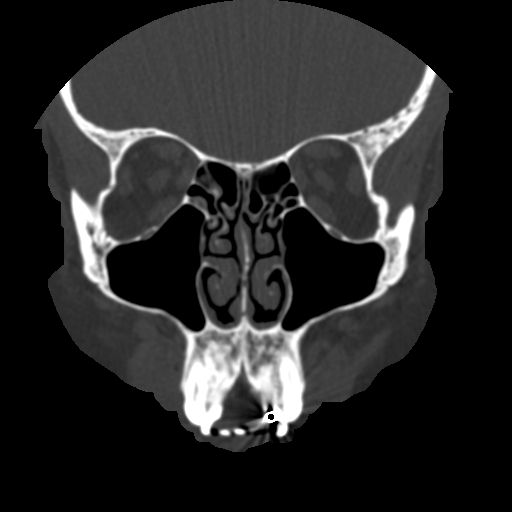
[im 7/12  bone]
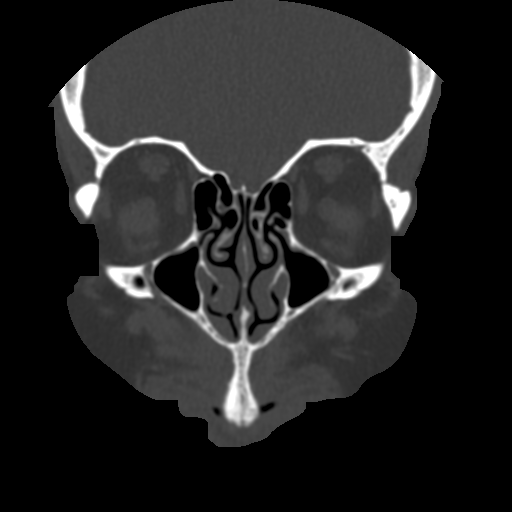
[im 8/12  bone]
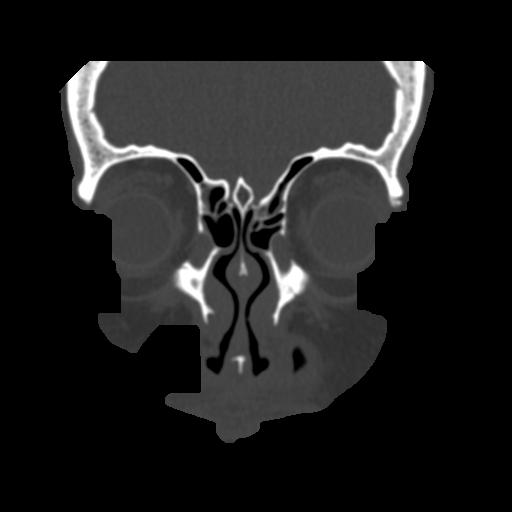
[im 9/12  bone]
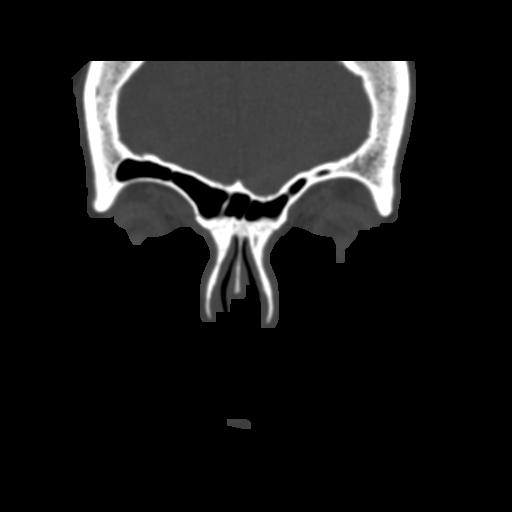
[im 10/12  brain]
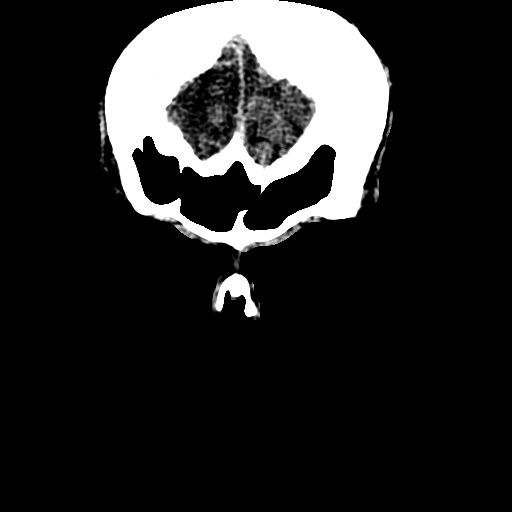
[im 10/12  bone]
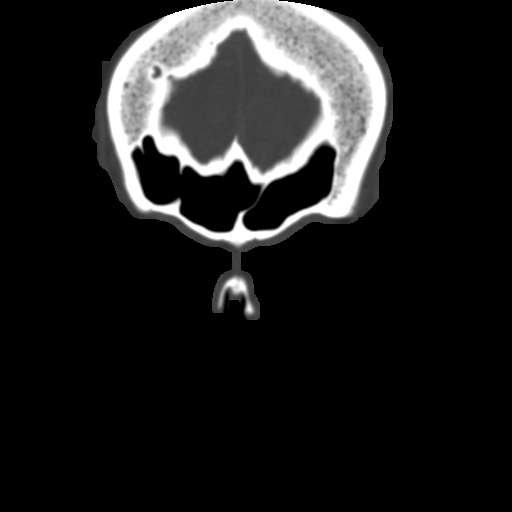
[im 11/12  bone]
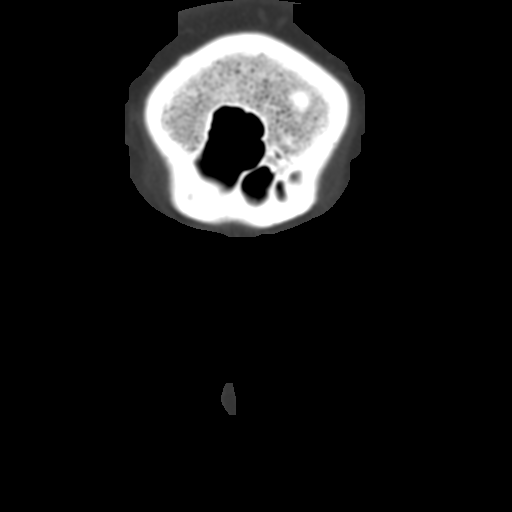

[10 of 12 positions shown; findings below may reference images not displayed]

FINDINGS: The frontal sinuses are clear.

The maxillary sinuses are clear.

The ethmoid sinuses show mild mucosal thickening.

The sphenoid sinus is clear.

Nasal septum midline. No concha bullosa. Mild mucosal thickening on
the left slightly narrows the ostiomeatal unit. No significant OMU
narrowing on the right.

Visualized intracranial compartment negative.  Negative orbits.
IMPRESSION: No evidence for acute sinus disease. Mild mucosal thickening
throughout the ethmoid air cells. No nasal septal deviation.

## 2014-01-07 DIAGNOSIS — R350 Frequency of micturition: Secondary | ICD-10-CM | POA: Diagnosis not present

## 2014-01-07 DIAGNOSIS — N320 Bladder-neck obstruction: Secondary | ICD-10-CM | POA: Diagnosis not present

## 2014-01-09 ENCOUNTER — Other Ambulatory Visit: Payer: Self-pay | Admitting: Emergency Medicine

## 2014-01-25 ENCOUNTER — Other Ambulatory Visit: Payer: Self-pay | Admitting: Internal Medicine

## 2014-02-19 ENCOUNTER — Ambulatory Visit (INDEPENDENT_AMBULATORY_CARE_PROVIDER_SITE_OTHER): Payer: Medicare Other | Admitting: Physician Assistant

## 2014-02-19 ENCOUNTER — Encounter: Payer: Self-pay | Admitting: Physician Assistant

## 2014-02-19 VITALS — BP 110/70 | HR 64 | Temp 97.7°F | Resp 16 | Wt 221.0 lb

## 2014-02-19 DIAGNOSIS — I10 Essential (primary) hypertension: Secondary | ICD-10-CM | POA: Diagnosis not present

## 2014-02-19 DIAGNOSIS — E559 Vitamin D deficiency, unspecified: Secondary | ICD-10-CM | POA: Diagnosis not present

## 2014-02-19 DIAGNOSIS — E291 Testicular hypofunction: Secondary | ICD-10-CM

## 2014-02-19 DIAGNOSIS — E785 Hyperlipidemia, unspecified: Secondary | ICD-10-CM | POA: Diagnosis not present

## 2014-02-19 DIAGNOSIS — Z79899 Other long term (current) drug therapy: Secondary | ICD-10-CM

## 2014-02-19 DIAGNOSIS — E119 Type 2 diabetes mellitus without complications: Secondary | ICD-10-CM

## 2014-02-19 NOTE — Patient Instructions (Signed)

## 2014-02-19 NOTE — Progress Notes (Signed)
Assessment and Plan:  Hypertension: Continue medication, monitor blood pressure at home. Continue DASH diet. Cholesterol: Continue diet and exercise. Check cholesterol.  Pre-diabetes-Continue diet and exercise. Check A1C Vitamin D Def- check level and continue medications.  Scaly nodules on left arm- will put on OTC cream, if not healing will make an appointment for removal.   Continue diet and meds as discussed. Further disposition pending results of labs.  HPI 68 y.o. male  presents for 3 month follow up with hypertension, hyperlipidemia, prediabetes and vitamin D. 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 He denies chest pain, shortness of breath, dizziness.  He is on cholesterol medication and denies myalgias. His cholesterol is at goal. The cholesterol last visit was:   Lab Results  Component Value Date   CHOL 151 11/20/2013   HDL 33* 11/20/2013   LDLCALC 86 11/20/2013   TRIG 160* 11/20/2013   CHOLHDL 4.6 11/20/2013   He has been working on diet and exercise for prediabetes, and denies paresthesia of the feet, polydipsia and polyuria. Last A1C in the office was:  Lab Results  Component Value Date   HGBA1C 6.2* 11/20/2013   Patient is on Vitamin D supplement.   Lab Results  Component Value Date   VD25OH 44 11/20/2013   He sees Franchot Gallo, MD for OAB, he is doing well on ditroban but he has discontinued the rapoflo and has not had any accidents. He will stop the oxybutynin as well and see how he does.   Just got back from the beach, states his wife is having her other knee replaced July 10th.    Current Medications:  Current Outpatient Prescriptions on File Prior to Visit  Medication Sig Dispense Refill  . ALPRAZolam (XANAX) 1 MG tablet TAKE 1/2 TO 1 TABLET BY MOUTH 3 TIMES A DAY AS NEEDED  90 tablet  1  . aspirin (ASPIRIN CHILDRENS) 81 MG  chewable tablet Chew 1 tablet (81 mg total) by mouth daily.  36 tablet  11  . chlorpheniramine (CHLOR-TRIMETON) 4 MG tablet Take 4 mg by mouth 2 (two) times daily as needed for allergies.      . Cholecalciferol (VITAMIN D-3) 5000 UNITS TABS Take 5,000 Units by mouth daily.      . fenofibrate micronized (LOFIBRA) 134 MG capsule       . HYDROcodone-acetaminophen (NORCO) 5-325 MG per tablet Take 1 tablet by mouth every 6 (six) hours as needed for moderate pain.  30 tablet  0  . losartan-hydrochlorothiazide (HYZAAR) 100-25 MG per tablet Take 1 tablet by mouth daily.      . metFORMIN (GLUCOPHAGE-XR) 500 MG 24 hr tablet Take 500 mg by mouth. Take 4 tablets daily.      Marland Kitchen omeprazole (PRILOSEC) 40 MG capsule       . oxybutynin (DITROPAN) 5 MG tablet Take 5 mg by mouth 2 (two) times daily.      . pravastatin (PRAVACHOL) 40 MG tablet TAKE 1 TABLET BY MOUTH AT BEDTIME FOR CHOLESTEROL  90 tablet  1  . silodosin (RAPAFLO) 8 MG CAPS capsule Take 8 mg by mouth at bedtime.       No current facility-administered medications on file prior to visit.   Medical History:  Past Medical History  Diagnosis Date  . Hypertension   . Hyperlipidemia   . Pre-diabetes   .  Hypogonadism male   . Thyroid disease     hypothyroid  . Gout   . OSA (obstructive sleep apnea)    Allergies:  Allergies  Allergen Reactions  . Cardura [Doxazosin Mesylate] Other (See Comments)    Nasal congestion  . Codeine Other (See Comments)    Pt passes out.  Marland Kitchen Hytrin [Terazosin]      Review of Systems: [X]  = complains of  [ ]  = denies  General: Fatigue [ ]  Fever [ ]  Chills [ ]  Weakness [ ]   Insomnia [ ]  Eyes: Redness [ ]  Blurred vision [ ]  Diplopia [ ]   ENT: Congestion [ ]  Sinus Pain [ ]  Post Nasal Drip [ ]  Sore Throat [ ]  Earache [ ]   Cardiac: Chest pain/pressure [ ]  SOB [ ]  Orthopnea [ ]   Palpitations [ ]   Paroxysmal nocturnal dyspnea[ ]  Claudication [ ]  Edema [ ]   Pulmonary: Cough [ ]  Wheezing[ ]   SOB [ ]   Snoring [ ]   GI:  Nausea [ ]  Vomiting[ ]  Dysphagia[ ]  Heartburn[ ]  Abdominal pain [ ]  Constipation [ ] ; Diarrhea [ ] ; BRBPR [ ]  Melena[ ]  GU: Hematuria[ ]  Dysuria [ ]  Nocturia[ ]  Urgency [ ]   Hesitancy [ ]  Discharge [ ]  Neuro: Headaches[ ]  Vertigo[ ]  Paresthesias[ ]  Spasm [ ]  Speech changes [ ]  Incoordination [ ]   Ortho: Arthritis [ ]  Joint pain [ ]  Muscle pain [ ]  Joint swelling [ ]  Back Pain [ ]  Skin:  Rash [ ]   Pruritis [ ]  Change in skin lesion [ ]   Psych: Depression[ ]  Anxiety[ ]  Confusion [ ]  Memory loss [ ]   Heme/Lypmh: Bleeding [ ]  Bruising [ ]  Enlarged lymph nodes [ ]   Endocrine: Visual blurring [ ]  Paresthesia [ ]  Polyuria [ ]  Polydypsea [ ]    Heat/cold intolerance [ ]  Hypoglycemia [ ]   Family history- Review and unchanged Social history- Review and unchanged Physical Exam: BP 110/70  Pulse 64  Temp(Src) 97.7 F (36.5 C)  Resp 16  Wt 221 lb (100.245 kg) Wt Readings from Last 3 Encounters:  02/19/14 221 lb (100.245 kg)  11/20/13 224 lb (101.606 kg)  08/18/13 222 lb 12.8 oz (101.061 kg)   General Appearance: Well nourished, in no apparent distress. Eyes: PERRLA, EOMs, conjunctiva no swelling or erythema Sinuses: No Frontal/maxillary tenderness ENT/Mouth: Ext aud canals clear, TMs without erythema, bulging. No erythema, swelling, or exudate on post pharynx.  Tonsils not swollen or erythematous. Hearing normal.  Neck: Supple, thyroid normal.  Respiratory: Respiratory effort normal, BS equal bilaterally without rales, rhonchi, wheezing or stridor.  Cardio: RRR with no MRGs. Brisk peripheral pulses without edema.  Abdomen: Soft, + BS.  Non tender, no guarding, rebound, hernias, masses. Lymphatics: Non tender without lymphadenopathy.  Musculoskeletal: Full ROM, 5/5 strength, normal gait.  Skin: Warm, dry without rashes, lesions, ecchymosis. Has some scaly erythematous areas on left arm, and left ear- he states they resolve Neuro: Cranial nerves intact. Normal muscle tone, no cerebellar symptoms.  Sensation intact.  Psych: Awake and oriented X 3, normal affect, Insight and Judgment appropriate.    Vicie Mutters 11:09 AM

## 2014-02-20 LAB — LIPID PANEL
CHOLESTEROL: 154 mg/dL (ref 0–200)
HDL: 36 mg/dL — ABNORMAL LOW (ref >39)
LDL CALC: 97 mg/dL (ref 0–99)
TRIGLYCERIDES: 105 mg/dL (ref <150)
Total CHOL/HDL Ratio: 4.3 Ratio
VLDL: 21 mg/dL (ref 0–40)

## 2014-02-20 LAB — MAGNESIUM: MAGNESIUM: 1.8 mg/dL (ref 1.5–2.5)

## 2014-02-20 LAB — INSULIN, FASTING: INSULIN FASTING, SERUM: 15 u[IU]/mL (ref 3–28)

## 2014-02-20 LAB — HEPATIC FUNCTION PANEL
ALT: 21 U/L (ref 0–53)
AST: 28 U/L (ref 0–37)
Albumin: 4.6 g/dL (ref 3.5–5.2)
Alkaline Phosphatase: 45 U/L (ref 39–117)
BILIRUBIN DIRECT: 0.1 mg/dL (ref 0.0–0.3)
BILIRUBIN INDIRECT: 0.6 mg/dL (ref 0.2–1.2)
Total Bilirubin: 0.7 mg/dL (ref 0.2–1.2)
Total Protein: 6.8 g/dL (ref 6.0–8.3)

## 2014-02-20 LAB — BASIC METABOLIC PANEL WITH GFR
BUN: 17 mg/dL (ref 6–23)
CHLORIDE: 104 meq/L (ref 96–112)
CO2: 28 mEq/L (ref 19–32)
Calcium: 9.4 mg/dL (ref 8.4–10.5)
Creat: 1.21 mg/dL (ref 0.50–1.35)
GFR, EST NON AFRICAN AMERICAN: 62 mL/min
GFR, Est African American: 72 mL/min
GLUCOSE: 105 mg/dL — AB (ref 70–99)
POTASSIUM: 4.1 meq/L (ref 3.5–5.3)
SODIUM: 142 meq/L (ref 135–145)

## 2014-02-20 LAB — VITAMIN D 25 HYDROXY (VIT D DEFICIENCY, FRACTURES): VIT D 25 HYDROXY: 54 ng/mL (ref 30–89)

## 2014-02-20 LAB — CBC WITH DIFFERENTIAL/PLATELET
BASOS PCT: 1 % (ref 0–1)
Basophils Absolute: 0.1 10*3/uL (ref 0.0–0.1)
Eosinophils Absolute: 0.2 10*3/uL (ref 0.0–0.7)
Eosinophils Relative: 3 % (ref 0–5)
HCT: 41.6 % (ref 39.0–52.0)
HEMOGLOBIN: 14.4 g/dL (ref 13.0–17.0)
LYMPHS PCT: 26 % (ref 12–46)
Lymphs Abs: 1.6 10*3/uL (ref 0.7–4.0)
MCH: 28.6 pg (ref 26.0–34.0)
MCHC: 34.6 g/dL (ref 30.0–36.0)
MCV: 82.7 fL (ref 78.0–100.0)
MONOS PCT: 9 % (ref 3–12)
Monocytes Absolute: 0.5 10*3/uL (ref 0.1–1.0)
NEUTROS ABS: 3.7 10*3/uL (ref 1.7–7.7)
Neutrophils Relative %: 61 % (ref 43–77)
Platelets: 176 10*3/uL (ref 150–400)
RBC: 5.03 MIL/uL (ref 4.22–5.81)
RDW: 14.8 % (ref 11.5–15.5)
WBC: 6.1 10*3/uL (ref 4.0–10.5)

## 2014-02-20 LAB — TSH: TSH: 3.201 u[IU]/mL (ref 0.350–4.500)

## 2014-02-20 LAB — HEMOGLOBIN A1C
Hgb A1c MFr Bld: 6 % — ABNORMAL HIGH (ref <5.7)
MEAN PLASMA GLUCOSE: 126 mg/dL — AB (ref <117)

## 2014-04-14 ENCOUNTER — Other Ambulatory Visit: Payer: Self-pay | Admitting: *Deleted

## 2014-04-14 MED ORDER — FENOFIBRATE MICRONIZED 134 MG PO CAPS: 134.0000 mg | ORAL_CAPSULE | Freq: Every day | ORAL | Status: DC

## 2014-05-05 ENCOUNTER — Other Ambulatory Visit: Payer: Self-pay | Admitting: Emergency Medicine

## 2014-05-27 ENCOUNTER — Other Ambulatory Visit: Payer: Self-pay | Admitting: Internal Medicine

## 2014-05-27 MED ORDER — OMEPRAZOLE 40 MG PO CPDR: 40.0000 mg | DELAYED_RELEASE_CAPSULE | Freq: Every day | ORAL | Status: DC

## 2014-05-27 MED ORDER — MUPIROCIN 2 % EX OINT: TOPICAL_OINTMENT | CUTANEOUS | Status: DC

## 2014-06-05 ENCOUNTER — Ambulatory Visit (INDEPENDENT_AMBULATORY_CARE_PROVIDER_SITE_OTHER): Payer: Medicare Other

## 2014-06-05 DIAGNOSIS — Z23 Encounter for immunization: Secondary | ICD-10-CM

## 2014-07-14 ENCOUNTER — Other Ambulatory Visit: Payer: Self-pay

## 2014-07-14 MED ORDER — OXYBUTYNIN CHLORIDE 5 MG PO TABS: 5.0000 mg | ORAL_TABLET | Freq: Three times a day (TID) | ORAL | Status: DC

## 2014-07-20 ENCOUNTER — Other Ambulatory Visit: Payer: Self-pay | Admitting: *Deleted

## 2014-07-20 MED ORDER — PRAVASTATIN SODIUM 40 MG PO TABS: ORAL_TABLET | ORAL | Status: DC

## 2014-08-12 ENCOUNTER — Other Ambulatory Visit: Payer: Self-pay | Admitting: Physician Assistant

## 2014-08-19 ENCOUNTER — Ambulatory Visit (INDEPENDENT_AMBULATORY_CARE_PROVIDER_SITE_OTHER): Payer: Medicare Other | Admitting: Internal Medicine

## 2014-08-19 ENCOUNTER — Encounter: Payer: Self-pay | Admitting: Internal Medicine

## 2014-08-19 VITALS — BP 140/88 | HR 72 | Temp 97.7°F | Resp 18 | Ht 70.75 in | Wt 232.0 lb

## 2014-08-19 DIAGNOSIS — E559 Vitamin D deficiency, unspecified: Secondary | ICD-10-CM

## 2014-08-19 DIAGNOSIS — Z79899 Other long term (current) drug therapy: Secondary | ICD-10-CM | POA: Insufficient documentation

## 2014-08-19 DIAGNOSIS — E1121 Type 2 diabetes mellitus with diabetic nephropathy: Secondary | ICD-10-CM

## 2014-08-19 DIAGNOSIS — Z1331 Encounter for screening for depression: Secondary | ICD-10-CM

## 2014-08-19 DIAGNOSIS — N138 Other obstructive and reflux uropathy: Secondary | ICD-10-CM

## 2014-08-19 DIAGNOSIS — M1000 Idiopathic gout, unspecified site: Secondary | ICD-10-CM | POA: Diagnosis not present

## 2014-08-19 DIAGNOSIS — Z125 Encounter for screening for malignant neoplasm of prostate: Secondary | ICD-10-CM

## 2014-08-19 DIAGNOSIS — E785 Hyperlipidemia, unspecified: Secondary | ICD-10-CM | POA: Diagnosis not present

## 2014-08-19 DIAGNOSIS — Z0001 Encounter for general adult medical examination with abnormal findings: Secondary | ICD-10-CM | POA: Diagnosis not present

## 2014-08-19 DIAGNOSIS — R6889 Other general symptoms and signs: Secondary | ICD-10-CM | POA: Diagnosis not present

## 2014-08-19 DIAGNOSIS — Z9181 History of falling: Secondary | ICD-10-CM

## 2014-08-19 DIAGNOSIS — Z23 Encounter for immunization: Secondary | ICD-10-CM | POA: Diagnosis not present

## 2014-08-19 DIAGNOSIS — Z1212 Encounter for screening for malignant neoplasm of rectum: Secondary | ICD-10-CM

## 2014-08-19 DIAGNOSIS — I10 Essential (primary) hypertension: Secondary | ICD-10-CM

## 2014-08-19 DIAGNOSIS — N401 Enlarged prostate with lower urinary tract symptoms: Secondary | ICD-10-CM

## 2014-08-19 DIAGNOSIS — Z0000 Encounter for general adult medical examination without abnormal findings: Secondary | ICD-10-CM

## 2014-08-19 DIAGNOSIS — E291 Testicular hypofunction: Secondary | ICD-10-CM

## 2014-08-19 LAB — CBC WITH DIFFERENTIAL/PLATELET
BASOS ABS: 0 10*3/uL (ref 0.0–0.1)
Basophils Relative: 0 % (ref 0–1)
Eosinophils Absolute: 0.2 10*3/uL (ref 0.0–0.7)
Eosinophils Relative: 3 % (ref 0–5)
HCT: 44.2 % (ref 39.0–52.0)
Hemoglobin: 15.3 g/dL (ref 13.0–17.0)
LYMPHS PCT: 25 % (ref 12–46)
Lymphs Abs: 1.7 10*3/uL (ref 0.7–4.0)
MCH: 29.9 pg (ref 26.0–34.0)
MCHC: 34.6 g/dL (ref 30.0–36.0)
MCV: 86.5 fL (ref 78.0–100.0)
MPV: 10 fL (ref 8.6–12.4)
Monocytes Absolute: 0.7 10*3/uL (ref 0.1–1.0)
Monocytes Relative: 10 % (ref 3–12)
Neutro Abs: 4.3 10*3/uL (ref 1.7–7.7)
Neutrophils Relative %: 62 % (ref 43–77)
Platelets: 185 10*3/uL (ref 150–400)
RBC: 5.11 MIL/uL (ref 4.22–5.81)
RDW: 14.2 % (ref 11.5–15.5)
WBC: 6.9 10*3/uL (ref 4.0–10.5)

## 2014-08-19 LAB — HEMOGLOBIN A1C
Hgb A1c MFr Bld: 6.9 % — ABNORMAL HIGH (ref <5.7)
Mean Plasma Glucose: 151 mg/dL — ABNORMAL HIGH (ref <117)

## 2014-08-19 NOTE — Patient Instructions (Signed)

## 2014-08-19 NOTE — Progress Notes (Signed)
Patient ID: Jeffery Reed, male   DOB: [birth year], 68 y.o.   MRN: [id number]  [facility] VISIT AND CPE  Assessment:   1. Essential hypertension  - EKG 12-Lead - [place], RETROPERITNL ABD,  LTD - TSH  2. Hyperlipidemia  - Lipid panel  3. Type II diabetes mellitus with nephropathy  - Microalbumin / creatinine urine ratio - HM DIABETES FOOT EXAM - LOW EXTREMITY NEUR EXAM DOCUM - Hemoglobin A1c - Insulin, fasting  4. Vitamin D deficiency  - Vit D  25 hydroxy (rtn osteoporosis monitoring)  5. Hypogonadism male   6. Idiopathic gout, unspecified chronicity, unspecified site  - Uric acid  7. Screening for rectal cancer  - POC Hemoccult Bld/Stl (3-Cd Home Screen); Future  8. At low risk for fall   9. Prostate cancer screening  - PSA  10. BPH/Prostatism   11. Medication management  - Urine Microscopic - CBC with Differential - BASIC METABOLIC PANEL WITH GFR - Hepatic function panel - Magnesium  12. Routine general medical examination at a health care facility   13. Depression screen  - negative  14. Need for prophylactic vaccination against Streptococcus pneumoniae (pneumococcus)  - Pneumococcal conjugate vaccine 13-valent  Plan:   During the course of the visit the patient was educated and counseled about appropriate screening and preventive services including:    Pneumococcal vaccine   Influenza vaccine  Td vaccine  Screening electrocardiogram  Bone densitometry screening  Colorectal cancer screening  Diabetes screening  Glaucoma screening  Nutrition counseling   Advanced directives: requested  Screening recommendations, referrals: Vaccinations: Tdap vaccine 03/08/2004 Influenza vaccine 06/05/2014 Pneumococcal vaccine 2005 Prevnar vaccine 08/19/2014 Shingles vaccine 08/18/2013 Hep B vaccine not indicated  Nutrition assessed and recommended  Colonoscopy 10/13/2008 Recommended yearly ophthalmology/optometry visit  for glaucoma screening and checkup Recommended yearly dental visit for hygiene and checkup Advanced directives - yes  Conditions/risks identified: BMI: Discussed weight loss, diet, and increase physical activity.  Increase physical activity: AHA recommends 150 minutes of physical activity a week.  Medications reviewed Diabetes is not at goal, ACE/ARB therapy: No, Reason not on Ace Inhibitor/ARB therapy:  All to ACEi & on Losartin (ARB)   Urinary Incontinence is an issue: discussed non pharmacology and pharmacology options.  Fall risk: low- discussed PT, home fall assessment, medications.   Subjective:  Jeffery Reed is a 68 y.o. MWM who presents for Medicare Annual Wellness Visit and complete physical.  Date of last medicare wellness visit is unknown.  He has had elevated blood pressure since 1997. His blood pressure has been controlled at home, today their BP is BP: 140/88 mmHg He does not workout. He denies chest pain, shortness of breath, dizziness.  He is on cholesterol medication and denies myalgias. His cholesterol is not at goal. The cholesterol last visit was:  Lab Results  Component Value Date   CHOL 154 02/19/2014   HDL 36* 02/19/2014   LDLCALC 97 02/19/2014   TRIG 105 02/19/2014   CHOLHDL 4.3 02/19/2014   He has had Prediabetes for 10 years since 2005. He has not been working on diet and exercise for prediabetes, and denies foot ulcerations and hyperglycemia. Last A1C in the office was:  Lab Results  Component Value Date   HGBA1C 6.0* 02/19/2014   Patient is on Vitamin D supplement.   Lab Results  Component Value Date   VD25OH 54 02/19/2014      Names of Other Physician/Practitioners you currently use: 1. [facility]  here for primary care 2. [facility] in [place], eye doctor, Dec,  2015 3. Dr Yong Channel, [place], dentist, last visit Jan 2015  Patient Care Team: Unk Pinto, MD as PCP - General (Internal Medicine) Jorja Loa, MD as Consulting Physician (Urology)  Medication Review: Medication Sig  . ALPRAZolam (XANAX) 1 MG tablet TAKE 1/2 TO 1 TABLET BY MOUTH 3 TIMES A DAY AS NEEDED  . chlorpheniramine 4 MG tablet Take 4 mg by mouth 2 (two) times daily as needed for allergies.  Marland Kitchen VITAMIN D 5000 UNITS TABS Take 5,000 Units by mouth daily.  . fenofibrate 134 MG capsule Take 1 capsule (134 mg total) by mouth daily before breakfast.  .  (NORCO) 5-325 MG per tablet Take 1 tablet by mouth every 6 (six) hours as needed for moderate pain.  Marland Kitchen losartan-hydrochlorothiazide 100-25 MG per tablet TAKE 1 TABLET BY MOUTH ONCE A DAY  . metFORMIN (GLUCOPHAGE-XR) 500 MG 24 hr tablet Take 500 mg by mouth. Take 4 tablets daily.  . mupirocin ointment (BACTROBAN) 2 % Apply to skin  2 x day  . omeprazole  40 MG capsule Take 1 capsule (40 mg total) by mouth daily. For acid reflux  . oxybutynin (DITROPAN) 5 MG tablet Take 1 tablet (5 mg total) by mouth 3 (three) times daily.  . pravastatin (PRAVACHOL) 40 MG tablet TAKE 1 TABLET BY MOUTH AT BEDTIME FOR CHOLESTEROL  . silodosin (RAPAFLO) 8 MG CAPS  Take 8 mg by mouth at bedtime.   Current Problems (verified) Patient Active Problem List   Diagnosis Date Noted  . Medication management 08/19/2014  . BPH/Prostatism 08/19/2014  . Essential hypertension 08/17/2013  . Type II diabetes mellitus with nephropathy 08/17/2013  . Vitamin D deficiency 08/17/2013  . Hyperlipidemia   . Hypogonadism male   . Gout   . OSA (obstructive sleep apnea)    Screening Tests Health Maintenance  Topic Date Due  . FOOT EXAM  07/12/1956  . OPHTHALMOLOGY EXAM  [birth year]/12/1956  . TETANUS/TDAP  03/08/2014  . URINE MICROALBUMIN  08/18/2014  . HEMOGLOBIN A1C  08/22/2014  . INFLUENZA VACCINE  03/22/2015  . COLONOSCOPY  10/13/2018  . PNEUMOCOCCAL POLYSACCHARIDE VACCINE AGE 41 AND OVER  Completed  . ZOSTAVAX  Completed   Immunization History  Administered Date(s) Administered  . Influenza Whole  06/05/2013  . Influenza, High Dose Seasonal PF 06/05/2014  . Pneumococcal Conjugate-13 08/19/2014  . Pneumococcal Polysaccharide-23 08/22/2003  . Tdap 03/08/2004  . Zoster 08/18/2013   Preventative care: Last colonoscopy: 10/13/2008  Prior vaccinations: Tdap: 03/08/2004  Influenza: HD 06/05/2014  Pneumococcal: 2005  Prevnar/13 : 08/19/2014 Shingles/Zostavax: 08/18/2013  History reviewed: allergies, current medications, past family history, past medical history, past social history, past surgical history and problem list  Risk Factors: Tobacco History  Substance Use Topics  . Smoking status: Never Smoker   . Smokeless tobacco: Not on file  . Alcohol Use: No   He does not smoke.  Patient is not a former smoker. Are there smokers in your home (other than you)?  No  Alcohol Current alcohol use: none  Caffeine Current caffeine use: coffee 1cup /day  Exercise Current exercise: housecleaning and yard work  Nutrition/Diet Current diet: in general, a "healthy" diet    Cardiac risk factors: advanced age (older than 70 for men, 56 for women), diabetes mellitus, dyslipidemia, hypertension, male gender, obesity (BMI >= 30 kg/m2) and sedentary lifestyle.  Depression Screen (Note: if answer to either of the following is "Yes", a more  complete depression screening is indicated)   Q1: Over the past two weeks, have you felt down, depressed or hopeless? No  Q2: Over the past two weeks, have you felt little interest or pleasure in doing things? No  Have you lost interest or pleasure in daily life? No  Do you often feel hopeless? No  Do you cry easily over simple problems? No  Activities of Daily Living In your present state of health, do you have any difficulty performing the following activities?:  Driving? No Managing money?  No Feeding yourself? No Getting from bed to chair? No Climbing a flight of stairs? No Preparing food and eating?: No Bathing or showering? No Getting  dressed: No Getting to the toilet? No Using the toilet:No Moving around from place to place: No In the past year have you fallen or had a near fall?:No   Are you sexually active?  Yes  Do you have more than one partner?  No  Vision Difficulties: No  Hearing Difficulties: No Do you often ask people to speak up or repeat themselves? No Do you experience ringing or noises in your ears? No Do you have difficulty understanding soft or whispered voices? No  Cognition  Do you feel that you have a problem with memory?No  Do you often misplace items? No  Do you feel safe at home?  Yes  Advanced directives Does patient have a [place]? Yes Does patient have a Living Will? Yes  In addition to the HPI above,  No Fever-chills,  No Headache, No changes with Vision or hearing,  No problems swallowing food or Liquids,  No Chest pain or productive Cough or Shortness of Breath,  No Abdominal pain, No Nausea or Vomitting, Bowel movements are regular,  No Blood in stool or Urine,  No dysuria,  No new skin rashes or bruises,  No new joints pains-aches,  No new weakness, tingling, numbness in any extremity,  No recent weight loss,  No polyuria, polydypsia or polyphagia,  No significant Mental Stressors.  A full 10 point Review of Systems was done, except as stated above, all other Review of Systems were negative  Objective:     BP 140/88   Pulse 72  Temp 97.7 F  Resp 18  Ht 5' 10.75"  Wt 232 lb    BMI 32.59  General appearance: alert, no distress, WD/WN, male Cognitive Testing  Alert? Yes  Normal Appearance? Yes  Oriented to person? Yes  Place? Yes   Time? Yes  Recall of three objects?  Yes  Can perform simple calculations? Yes  Displays appropriate judgment? Yes  Can read the correct time from a watch/clock? Yes  HEENT: normocephalic, sclerae anicteric, TMs pearly, nares patent, no discharge or erythema, pharynx normal Oral cavity: MMM, no  lesions Neck: supple, no lymphadenopathy, no thyromegaly, no masses Heart: RRR, normal S1, S2, no murmurs Lungs: CTA bilaterally, no wheezes, rhonchi, or rales Abdomen: +bs, soft, non tender, non distended, no masses, no hepatomegaly, no splenomegaly DRE - Negative & Nl for age. Heme neg.  Musculoskeletal: nontender, no swelling, no obvious deformity Extremities: no edema, no cyanosis, no clubbing Pulses: 2+ symmetric, upper and lower extremities, normal cap refill Neurological: alert, oriented x 3, CN2-12 intact, strength normal upper extremities and lower extremities, sensation normal by monofilament testing to the toes bilaterally.  DTRs 2+ throughout, no cerebellar signs, gait normal Psychiatric: normal affect, behavior normal, pleasant   Medicare Attestation I have personally reviewed: The patient's  medical and social history Their use of alcohol, tobacco or illicit drugs Their current medications and supplements The patient's functional ability including ADLs,fall risks, home safety risks, cognitive, and hearing and visual impairment Diet and physical activities Evidence for depression or mood disorders  The patient's weight, height, BMI, and visual acuity have been recorded in the chart.  I have made referrals, counseling, and provided education to the patient based on review of the above and I have provided the patient with a written personalized care plan for preventive services.    Jkayla Spiewak DAVID, MD   08/19/2014

## 2014-08-20 LAB — HEPATIC FUNCTION PANEL
ALBUMIN: 4.5 g/dL (ref 3.5–5.2)
ALT: 21 U/L (ref 0–53)
AST: 23 U/L (ref 0–37)
Alkaline Phosphatase: 57 U/L (ref 39–117)
Bilirubin, Direct: 0.2 mg/dL (ref 0.0–0.3)
Indirect Bilirubin: 0.6 mg/dL (ref 0.2–1.2)
TOTAL PROTEIN: 7.3 g/dL (ref 6.0–8.3)
Total Bilirubin: 0.8 mg/dL (ref 0.2–1.2)

## 2014-08-20 LAB — VITAMIN D 25 HYDROXY (VIT D DEFICIENCY, FRACTURES): Vit D, 25-Hydroxy: 54 ng/mL (ref 30–100)

## 2014-08-20 LAB — URINALYSIS, MICROSCOPIC ONLY
Bacteria, UA: NONE SEEN
CASTS: NONE SEEN
Crystals: NONE SEEN
Squamous Epithelial / LPF: NONE SEEN

## 2014-08-20 LAB — LIPID PANEL
CHOL/HDL RATIO: 6 ratio
Cholesterol: 162 mg/dL (ref 0–200)
HDL: 27 mg/dL — ABNORMAL LOW (ref >39)
LDL Cholesterol: 92 mg/dL (ref 0–99)
Triglycerides: 214 mg/dL — ABNORMAL HIGH (ref <150)
VLDL: 43 mg/dL — AB (ref 0–40)

## 2014-08-20 LAB — BASIC METABOLIC PANEL WITH GFR
BUN: 23 mg/dL (ref 6–23)
CHLORIDE: 101 meq/L (ref 96–112)
CO2: 26 mEq/L (ref 19–32)
Calcium: 9.4 mg/dL (ref 8.4–10.5)
Creat: 1.2 mg/dL (ref 0.50–1.35)
GFR, EST AFRICAN AMERICAN: 72 mL/min
GFR, Est Non African American: 62 mL/min
GLUCOSE: 193 mg/dL — AB (ref 70–99)
Potassium: 4.2 mEq/L (ref 3.5–5.3)
Sodium: 138 mEq/L (ref 135–145)

## 2014-08-20 LAB — URIC ACID: Uric Acid, Serum: 5.7 mg/dL (ref 4.0–7.8)

## 2014-08-20 LAB — MICROALBUMIN / CREATININE URINE RATIO
Creatinine, Urine: 130.3 mg/dL
MICROALB UR: 0.5 mg/dL (ref <2.0)
MICROALB/CREAT RATIO: 3.8 mg/g (ref 0.0–30.0)

## 2014-08-20 LAB — TSH: TSH: 3.497 u[IU]/mL (ref 0.350–4.500)

## 2014-08-20 LAB — MAGNESIUM: Magnesium: 1.5 mg/dL (ref 1.5–2.5)

## 2014-08-20 LAB — INSULIN, FASTING: Insulin fasting, serum: 27.5 u[IU]/mL — ABNORMAL HIGH (ref 2.0–19.6)

## 2014-08-20 LAB — PSA: PSA: 0.77 ng/mL (ref <=4.00)

## 2014-08-26 DIAGNOSIS — E119 Type 2 diabetes mellitus without complications: Secondary | ICD-10-CM | POA: Diagnosis not present

## 2014-09-08 ENCOUNTER — Other Ambulatory Visit: Payer: Self-pay | Admitting: *Deleted

## 2014-09-08 MED ORDER — METFORMIN HCL ER 500 MG PO TB24: ORAL_TABLET | ORAL | Status: DC

## 2014-10-13 ENCOUNTER — Telehealth: Payer: Self-pay | Admitting: *Deleted

## 2014-10-13 MED ORDER — AZITHROMYCIN 250 MG PO TABS
ORAL_TABLET | ORAL | Status: DC
Start: 2014-10-13 — End: 2014-11-24

## 2014-10-13 NOTE — Telephone Encounter (Signed)
Patient called and states he has cough with sinus drainage.  OK to send in [place] and advised patient to use Delsym cough med per Dr Melford Aase.

## 2014-11-15 ENCOUNTER — Other Ambulatory Visit: Payer: Self-pay | Admitting: Physician Assistant

## 2014-11-24 ENCOUNTER — Encounter: Payer: Self-pay | Admitting: Physician Assistant

## 2014-11-24 ENCOUNTER — Ambulatory Visit (INDEPENDENT_AMBULATORY_CARE_PROVIDER_SITE_OTHER): Payer: Medicare Other | Admitting: Physician Assistant

## 2014-11-24 VITALS — BP 128/70 | HR 76 | Temp 97.5°F | Resp 16 | Ht 70.75 in | Wt 235.0 lb

## 2014-11-24 DIAGNOSIS — I10 Essential (primary) hypertension: Secondary | ICD-10-CM

## 2014-11-24 DIAGNOSIS — E1121 Type 2 diabetes mellitus with diabetic nephropathy: Secondary | ICD-10-CM

## 2014-11-24 DIAGNOSIS — Z79899 Other long term (current) drug therapy: Secondary | ICD-10-CM

## 2014-11-24 DIAGNOSIS — E559 Vitamin D deficiency, unspecified: Secondary | ICD-10-CM

## 2014-11-24 DIAGNOSIS — E785 Hyperlipidemia, unspecified: Secondary | ICD-10-CM | POA: Diagnosis not present

## 2014-11-24 DIAGNOSIS — E1129 Type 2 diabetes mellitus with other diabetic kidney complication: Secondary | ICD-10-CM | POA: Diagnosis not present

## 2014-11-24 LAB — CBC WITH DIFFERENTIAL/PLATELET
BASOS ABS: 0 10*3/uL (ref 0.0–0.1)
BASOS PCT: 0 % (ref 0–1)
Eosinophils Absolute: 0.2 10*3/uL (ref 0.0–0.7)
Eosinophils Relative: 3 % (ref 0–5)
HEMATOCRIT: 43.5 % (ref 39.0–52.0)
Hemoglobin: 14.9 g/dL (ref 13.0–17.0)
LYMPHS PCT: 26 % (ref 12–46)
Lymphs Abs: 2.2 10*3/uL (ref 0.7–4.0)
MCH: 28.9 pg (ref 26.0–34.0)
MCHC: 34.3 g/dL (ref 30.0–36.0)
MCV: 84.5 fL (ref 78.0–100.0)
MONO ABS: 0.7 10*3/uL (ref 0.1–1.0)
MPV: 10 fL (ref 8.6–12.4)
Monocytes Relative: 8 % (ref 3–12)
NEUTROS ABS: 5.2 10*3/uL (ref 1.7–7.7)
Neutrophils Relative %: 63 % (ref 43–77)
PLATELETS: 210 10*3/uL (ref 150–400)
RBC: 5.15 MIL/uL (ref 4.22–5.81)
RDW: 13.9 % (ref 11.5–15.5)
WBC: 8.3 10*3/uL (ref 4.0–10.5)

## 2014-11-24 LAB — HEMOGLOBIN A1C
HEMOGLOBIN A1C: 7.2 % — AB (ref <5.7)
MEAN PLASMA GLUCOSE: 160 mg/dL — AB (ref <117)

## 2014-11-24 NOTE — Patient Instructions (Signed)
Diabetes is a very complicated disease...lets simplify it.  An easy way to look at it to understand the complications is if you think of the extra sugar floating in your blood stream as glass shards floating through your blood stream.    Diabetes affects your small vessels first: 1) The glass shards (sugar) scraps down the tiny blood vessels in your eyes and lead to diabetic retinopathy, the leading cause of blindness in the [place]. Diabetes is the leading cause of newly diagnosed adult (68 to 68 years of age) blindness in the [place].  2) The glass shards scratches down the tiny vessels of your legs leading to nerve damage called neuropathy and can lead to amputations of your feet. More than 60% of all non-traumatic amputations of lower limbs occur in people with diabetes.  3) Over time the small vessels in your brain are shredded and closed off, individually this does not cause any problems but over a long period of time many of the small vessels being blocked can lead to Vascular Dementia.   4) Your kidney's are a filter system and have a "net" that keeps certain things in the body and lets bad things out. Sugar shreds this net and leads to kidney damage and eventually failure. Decreasing the sugar that is destroying the net and certain blood pressure medications can help stop or decrease progression of kidney disease. Diabetes was the primary cause of kidney failure in 44 percent of all new cases in 2011.  5) Diabetes also destroys the small vessels in your penis that lead to erectile dysfunction. Eventually the vessels are so damaged that you may not be responsive to cialis or viagra.   Diabetes and your large vessels: Your larger vessels consist of your coronary arteries in your heart and the carotid vessels to your brain. Diabetes or even increased sugars put you at 300% increased risk of heart attack and stroke and this is why.. The sugar scrapes down your large blood vessels and your body  sees this as an internal injury and tries to repair itself. Just like you get a scab on your skin, your platelets will stick to the blood vessel wall trying to heal it. This is why we have diabetics on low dose aspirin daily, this prevents the platelets from sticking and can prevent plaque formation. In addition, your body takes cholesterol and tries to shove it into the open wound. This is why we want your LDL, or bad cholesterol, below 70.   The combination of platelets and cholesterol over 5-10 years forms plaque that can break off and cause a heart attack or stroke.   PLEASE REMEMBER:  Diabetes is preventable! Up to 40 percent of complications and morbidities among individuals with type 2 diabetes can be prevented, delayed, or effectively treated and minimized with regular visits to a health professional, appropriate monitoring and medication, and a healthy diet and lifestyle.   Your A1C is a measure of your sugar over the past 3 months and is not affected by what you have eaten over the past few days. Diabetes increases your chances of stroke and heart attack over 300 % and is the leading cause of blindness and kidney failure in the [place]. Please make sure you decrease bad carbs like white bread, white rice, potatoes, corn, soft drinks, pasta, cereals, refined sugars, sweet tea, dried fruits, and fruit juice. Good carbs are okay to eat in moderation like sweet potatoes, brown rice, whole grain pasta/bread, most fruit (except  dried fruit) and you can eat as many veggies as you want.   Greater than 6.5 is considered diabetic. Between 6.4 and 5.7 is prediabetic If your A1C is less than 5.7 you are NOT diabetic.  Targets for Glucose Readings: Time of Check Target for patients WITHOUT Diabetes Target for DIABETICS  Before Meals Less than 100  less than 150  Two hours after meals Less than 200  Less than 250    Recommendations For Diabetic/Prediabetic Patients:   -  Take medications as  prescribed  -  Recommend Dr Fara Olden Fuhrman's book "The End of Diabetes "  And "The End of Dieting"- Can get at  www.[place].com and encourage also get the Audio CD book  - AVOID Animal products, ie. Meat - red/white, Poultry and Dairy/especially cheese - Exercise at least 5 times a week for 30 minutes or preferably daily.  - No Smoking - Drink less than 2 drinks a day.  - Monitor your feet for sores - Have yearly Eye Exams - Recommend annual Flu vaccine  - Recommend Pneumovax and Prevnar vaccines - Shingles Vaccine (Zostavax) if over 44 y.o.  Goals:   - BMI less than 24 - Fasting sugar less than 130 or less than 150 if tapering medicines to lose weight  - Systolic BP less than 768  - Diastolic BP less than 80 - Bad LDL Cholesterol less than 70 - Triglycerides less than 150

## 2014-11-24 NOTE — Progress Notes (Signed)
Assessment and Plan:  1. Hypertension -Continue medication, monitor blood pressure at home. Continue DASH diet.  Reminder to go to the ER if any CP, SOB, nausea, dizziness, severe HA, changes vision/speech, left arm numbness and tingling and jaw pain.  2. Cholesterol -Continue diet and exercise. Check cholesterol.   3. PreDM last visit in the diabetes  -Continue diet and exercise. Check A1C - started MF last visit.   4. Vitamin D Def - check level and continue medications.   5. Obesity with co morbidities-  long discussion about weight loss, diet, and exercise  6. Vertigo Add bASA, 1 episode of vertigo, normal neuro, monitor BP, check labs, if it happens again follow up in the office, if worsening HA, changes vision/speech, imbalance, weakness go to the ER  Continue diet and meds as discussed. Further disposition pending results of labs. Over 30 minutes of exam, counseling, chart review, and critical decision making was performed  HPI 69 y.o. male  presents for 3 month follow up on hypertension, cholesterol, prediabetes, and vitamin D deficiency.   His blood pressure has been controlled at home, today their BP is BP: 128/70 mmHg  He does not workout. He denies chest pain, shortness of breath, dizziness.  He is on cholesterol medication and denies myalgias. His cholesterol is at goal. The cholesterol last visit was:   Lab Results  Component Value Date   CHOL 162 08/19/2014   HDL 27* 08/19/2014   LDLCALC 92 08/19/2014   TRIG 214* 08/19/2014   CHOLHDL 6.0 08/19/2014   He has been working on diet and exercise for diabetes, he is on bASA, , and denies paresthesia of the feet, polydipsia, polyuria and visual disturbances. Last A1C in the office was:  Lab Results  Component Value Date   HGBA1C 6.9* 08/19/2014  Patient is on Vitamin D supplement.   Lab Results  Component Value Date   VD25OH 32 08/19/2014   Had one episode of dizziness while in [place] while working out in the  garage, his sugars were elevated, he denies CP, SOB, nausea. Just had vertigo, lasted for all day and then resolved the next day. Has not happened again.   BMI is Body mass index is 33.01 kg/(m^2)., he is working on diet and exercise. Wt Readings from Last 3 Encounters:  11/24/14 235 lb (106.595 kg)  08/19/14 232 lb (105.235 kg)  02/19/14 221 lb (100.245 kg)    Current Medications:  Current Outpatient Prescriptions on File Prior to Visit  Medication Sig Dispense Refill  . ALPRAZolam (XANAX) 1 MG tablet TAKE 1/2 TO 1 TABLET BY MOUTH 3 TIMES A DAY AS NEEDED 90 tablet 3  . chlorpheniramine (CHLOR-TRIMETON) 4 MG tablet Take 4 mg by mouth 2 (two) times daily as needed for allergies.    . Cholecalciferol (VITAMIN D-3) 5000 UNITS TABS Take 5,000 Units by mouth daily.    . fenofibrate micronized (LOFIBRA) 134 MG capsule Take 1 capsule (134 mg total) by mouth daily before breakfast. 90 capsule 3  . fluticasone (FLONASE) 50 MCG/ACT nasal spray   6  . HYDROcodone-acetaminophen (NORCO) 5-325 MG per tablet Take 1 tablet by mouth every 6 (six) hours as needed for moderate pain. 30 tablet 0  . losartan-hydrochlorothiazide (HYZAAR) 100-25 MG per tablet TAKE 1 TABLET BY MOUTH ONCE A DAY 90 tablet 1  . metFORMIN (GLUCOPHAGE-XR) 500 MG 24 hr tablet Take 2 tablets twice daily to = 4 tabs daily 120 tablet 2  . mupirocin ointment (BACTROBAN) 2 % Apply  to skin  2 x day 22 g 99  . omeprazole (PRILOSEC) 40 MG capsule Take 1 capsule (40 mg total) by mouth daily. For acid reflux 90 capsule 99  . oxybutynin (DITROPAN) 5 MG tablet TAKE 1 TABLET (5 MG TOTAL) BY MOUTH 3 (THREE) TIMES DAILY. 90 tablet 1  . pravastatin (PRAVACHOL) 40 MG tablet TAKE 1 TABLET BY MOUTH AT BEDTIME FOR CHOLESTEROL 90 tablet 1  . silodosin (RAPAFLO) 8 MG CAPS capsule Take 8 mg by mouth at bedtime.     No current facility-administered medications on file prior to visit.   Medical History:  Past Medical History  Diagnosis Date  . Hypertension    . Hyperlipidemia   . Pre-diabetes   . Hypogonadism male   . Thyroid disease     hypothyroid  . Gout   . OSA (obstructive sleep apnea)    Allergies:  Allergies  Allergen Reactions  . Cardura [Doxazosin Mesylate] Other (See Comments)    Nasal congestion  . Codeine Other (See Comments)    Pt passes out.  Marland Kitchen Hytrin [Terazosin]      Review of Systems:  Review of Systems  Constitutional: Negative.  Negative for fever, chills and malaise/fatigue.  HENT: Negative.   Eyes: Negative.  Negative for blurred vision.  Respiratory: Negative.   Cardiovascular: Negative.   Gastrointestinal: Negative.   Genitourinary: Negative.   Musculoskeletal: Negative.   Skin: Negative.   Neurological: Positive for dizziness. Negative for tingling, tremors, sensory change, speech change, focal weakness, seizures, loss of consciousness and headaches.  Endo/Heme/Allergies: Negative.   Psychiatric/Behavioral: Negative.     Family history- Review and unchanged Social history- Review and unchanged Physical Exam: BP 128/70 mmHg  Pulse 76  Temp(Src) 97.5 F (36.4 C)  Resp 16  Ht 5' 10.75" (1.797 m)  Wt 235 lb (106.595 kg)  BMI 33.01 kg/m2 Wt Readings from Last 3 Encounters:  11/24/14 235 lb (106.595 kg)  08/19/14 232 lb (105.235 kg)  02/19/14 221 lb (100.245 kg)   General Appearance: Well nourished, in no apparent distress. Eyes: PERRLA, EOMs, conjunctiva no swelling or erythema Sinuses: No Frontal/maxillary tenderness ENT/Mouth: Ext aud canals clear, TMs without erythema, bulging. No erythema, swelling, or exudate on post pharynx.  Tonsils not swollen or erythematous. Hearing normal.  Neck: Supple, thyroid normal.  Respiratory: Respiratory effort normal, BS equal bilaterally without rales, rhonchi, wheezing or stridor.  Cardio: RRR with no MRGs. Brisk peripheral pulses without edema.  Abdomen: Soft, + BS,  Non tender, no guarding, rebound, hernias, masses. Lymphatics: Non tender without  lymphadenopathy.  Musculoskeletal: Full ROM, 5/5 strength, Normal gait Skin: Warm, dry without rashes, lesions, ecchymosis.  Neuro: Cranial nerves intact. Normal muscle tone, no cerebellar symptoms. Psych: Awake and oriented X 3, normal affect, Insight and Judgment appropriate.    Vicie Mutters, PA-C 11:16 AM [facility]

## 2014-11-25 LAB — HEPATIC FUNCTION PANEL
ALK PHOS: 53 U/L (ref 39–117)
ALT: 20 U/L (ref 0–53)
AST: 21 U/L (ref 0–37)
Albumin: 4.6 g/dL (ref 3.5–5.2)
BILIRUBIN INDIRECT: 0.4 mg/dL (ref 0.2–1.2)
Bilirubin, Direct: 0.1 mg/dL (ref 0.0–0.3)
Total Bilirubin: 0.5 mg/dL (ref 0.2–1.2)
Total Protein: 7 g/dL (ref 6.0–8.3)

## 2014-11-25 LAB — VITAMIN D 25 HYDROXY (VIT D DEFICIENCY, FRACTURES): Vit D, 25-Hydroxy: 71 ng/mL (ref 30–100)

## 2014-11-25 LAB — LIPID PANEL
CHOL/HDL RATIO: 6.3 ratio
Cholesterol: 151 mg/dL (ref 0–200)
HDL: 24 mg/dL — AB (ref >=40)
LDL CALC: 83 mg/dL (ref 0–99)
Triglycerides: 221 mg/dL — ABNORMAL HIGH (ref <150)
VLDL: 44 mg/dL — ABNORMAL HIGH (ref 0–40)

## 2014-11-25 LAB — BASIC METABOLIC PANEL WITH GFR
BUN: 20 mg/dL (ref 6–23)
CHLORIDE: 100 meq/L (ref 96–112)
CO2: 28 mEq/L (ref 19–32)
Calcium: 9.5 mg/dL (ref 8.4–10.5)
Creat: 1.27 mg/dL (ref 0.50–1.35)
GFR, EST AFRICAN AMERICAN: 67 mL/min
GFR, EST NON AFRICAN AMERICAN: 58 mL/min — AB
GLUCOSE: 176 mg/dL — AB (ref 70–99)
POTASSIUM: 4.2 meq/L (ref 3.5–5.3)
Sodium: 138 mEq/L (ref 135–145)

## 2014-11-25 LAB — TSH: TSH: 1.872 u[IU]/mL (ref 0.350–4.500)

## 2014-11-25 LAB — MAGNESIUM: Magnesium: 1.6 mg/dL (ref 1.5–2.5)

## 2014-11-25 LAB — INSULIN, FASTING: Insulin fasting, serum: 18.2 u[IU]/mL (ref 2.0–19.6)

## 2014-11-29 ENCOUNTER — Other Ambulatory Visit: Payer: Self-pay | Admitting: Internal Medicine

## 2014-11-30 ENCOUNTER — Other Ambulatory Visit: Payer: Self-pay | Admitting: Physician Assistant

## 2014-12-02 DIAGNOSIS — H6993 Unspecified Eustachian tube disorder, bilateral: Secondary | ICD-10-CM | POA: Diagnosis not present

## 2014-12-02 DIAGNOSIS — H6123 Impacted cerumen, bilateral: Secondary | ICD-10-CM | POA: Diagnosis not present

## 2015-01-08 DIAGNOSIS — N320 Bladder-neck obstruction: Secondary | ICD-10-CM | POA: Diagnosis not present

## 2015-01-08 DIAGNOSIS — R350 Frequency of micturition: Secondary | ICD-10-CM | POA: Diagnosis not present

## 2015-01-14 ENCOUNTER — Other Ambulatory Visit: Payer: Self-pay | Admitting: Internal Medicine

## 2015-01-19 ENCOUNTER — Other Ambulatory Visit: Payer: Self-pay | Admitting: Internal Medicine

## 2015-02-04 ENCOUNTER — Other Ambulatory Visit: Payer: Self-pay | Admitting: Internal Medicine

## 2015-02-06 ENCOUNTER — Other Ambulatory Visit: Payer: Self-pay | Admitting: Physician Assistant

## 2015-02-18 ENCOUNTER — Other Ambulatory Visit: Payer: Self-pay | Admitting: Internal Medicine

## 2015-02-24 ENCOUNTER — Ambulatory Visit (INDEPENDENT_AMBULATORY_CARE_PROVIDER_SITE_OTHER): Payer: Medicare Other | Admitting: Physician Assistant

## 2015-02-24 ENCOUNTER — Encounter: Payer: Self-pay | Admitting: Physician Assistant

## 2015-02-24 VITALS — BP 138/78 | HR 72 | Temp 97.8°F | Resp 16 | Ht 70.75 in | Wt 233.0 lb

## 2015-02-24 DIAGNOSIS — E1121 Type 2 diabetes mellitus with diabetic nephropathy: Secondary | ICD-10-CM | POA: Diagnosis not present

## 2015-02-24 DIAGNOSIS — E785 Hyperlipidemia, unspecified: Secondary | ICD-10-CM

## 2015-02-24 DIAGNOSIS — Z79899 Other long term (current) drug therapy: Secondary | ICD-10-CM

## 2015-02-24 DIAGNOSIS — I10 Essential (primary) hypertension: Secondary | ICD-10-CM

## 2015-02-24 DIAGNOSIS — E1129 Type 2 diabetes mellitus with other diabetic kidney complication: Secondary | ICD-10-CM | POA: Diagnosis not present

## 2015-02-24 DIAGNOSIS — E291 Testicular hypofunction: Secondary | ICD-10-CM

## 2015-02-24 DIAGNOSIS — E559 Vitamin D deficiency, unspecified: Secondary | ICD-10-CM

## 2015-02-24 DIAGNOSIS — G4733 Obstructive sleep apnea (adult) (pediatric): Secondary | ICD-10-CM

## 2015-02-24 LAB — LIPID PANEL
Cholesterol: 197 mg/dL (ref 0–200)
HDL: 26 mg/dL — ABNORMAL LOW (ref >=40)
LDL Cholesterol: 120 mg/dL — ABNORMAL HIGH (ref 0–99)
TRIGLYCERIDES: 255 mg/dL — AB (ref <150)
Total CHOL/HDL Ratio: 7.6 Ratio
VLDL: 51 mg/dL — AB (ref 0–40)

## 2015-02-24 LAB — HEPATIC FUNCTION PANEL
ALBUMIN: 4.3 g/dL (ref 3.5–5.2)
ALT: 26 U/L (ref 0–53)
AST: 27 U/L (ref 0–37)
Alkaline Phosphatase: 54 U/L (ref 39–117)
Bilirubin, Direct: 0.1 mg/dL (ref 0.0–0.3)
Indirect Bilirubin: 0.4 mg/dL (ref 0.2–1.2)
TOTAL PROTEIN: 6.7 g/dL (ref 6.0–8.3)
Total Bilirubin: 0.5 mg/dL (ref 0.2–1.2)

## 2015-02-24 LAB — MAGNESIUM: Magnesium: 1.8 mg/dL (ref 1.5–2.5)

## 2015-02-24 LAB — BASIC METABOLIC PANEL WITH GFR
BUN: 16 mg/dL (ref 6–23)
CO2: 25 mEq/L (ref 19–32)
Calcium: 9.6 mg/dL (ref 8.4–10.5)
Chloride: 104 mEq/L (ref 96–112)
Creat: 1.19 mg/dL (ref 0.50–1.35)
GFR, EST NON AFRICAN AMERICAN: 63 mL/min
GFR, Est African American: 73 mL/min
GLUCOSE: 305 mg/dL — AB (ref 70–99)
Potassium: 4 mEq/L (ref 3.5–5.3)
Sodium: 138 mEq/L (ref 135–145)

## 2015-02-24 LAB — TSH: TSH: 2.974 u[IU]/mL (ref 0.350–4.500)

## 2015-02-24 NOTE — Addendum Note (Signed)
Addended by: Vicie Mutters R on: 02/24/2015 10:04 AM   Modules accepted: Orders

## 2015-02-24 NOTE — Progress Notes (Signed)
Assessment and Plan:  1. Hypertension -Continue medication, monitor blood pressure at home. Continue DASH diet.  Reminder to go to the ER if any CP, SOB, nausea, dizziness, severe HA, changes vision/speech, left arm numbness and tingling and jaw pain.  2. Cholesterol -Continue diet and exercise. Check cholesterol.   3. Diabetes with CKD stage 2 -Continue diet and exercise. Check A1C - started MF last visit.   4. Vitamin D Def - check level and continue medications.   5. Obesity with co morbidities-  long discussion about weight loss, diet, and exercise  6.OSA Would like information about the moses appliance, he is on the machine right now.   Continue diet and meds as discussed. Further disposition pending results of labs. Over 30 minutes of exam, counseling, chart review, and critical decision making was performed  HPI 69 y.o. male  presents for 3 month follow up on hypertension, cholesterol, prediabetes, and vitamin D deficiency.   His blood pressure has been controlled at home, today their BP is BP: 138/78 mmHg  He does not workout. He denies chest pain, shortness of breath, dizziness.  He is on cholesterol medication, fenofibrate and fenofibrate and denies myalgias. His cholesterol is at goal. The cholesterol last visit was:   Lab Results  Component Value Date   CHOL 151 11/24/2014   HDL 24* 11/24/2014   LDLCALC 83 11/24/2014   TRIG 221* 11/24/2014   CHOLHDL 6.3 11/24/2014   He has been working on diet and exercise for diabetes with CKD (GFR 58), he is on 4 metformin a day, he is on bASA, he is on ARB , and denies paresthesia of the feet, polydipsia, polyuria and visual disturbances. Last A1C in the office was:  Lab Results  Component Value Date   HGBA1C 7.2* 11/24/2014  Patient is on Vitamin D supplement.   Lab Results  Component Value Date   VD25OH 71 11/24/2014   BMI is Body mass index is 32.73 kg/(m^2)., he is working on diet and exercise. Wt Readings from Last 3  Encounters:  02/24/15 233 lb (105.688 kg)  11/24/14 235 lb (106.595 kg)  08/19/14 232 lb (105.235 kg)    Current Medications:  Current Outpatient Prescriptions on File Prior to Visit  Medication Sig Dispense Refill  . ALPRAZolam (XANAX) 1 MG tablet TAKE ONE-HALF OR ONE TABLET BY MOUTH THREE TIMES A DAY AS NEEDED 90 tablet 3  . chlorpheniramine (CHLOR-TRIMETON) 4 MG tablet Take 4 mg by mouth 2 (two) times daily as needed for allergies.    . Cholecalciferol (VITAMIN D-3) 5000 UNITS TABS Take 5,000 Units by mouth daily.    . fenofibrate micronized (LOFIBRA) 134 MG capsule Take 1 capsule (134 mg total) by mouth daily before breakfast. 90 capsule 3  . fluticasone (FLONASE) 50 MCG/ACT nasal spray   6  . HYDROcodone-acetaminophen (NORCO) 5-325 MG per tablet Take 1 tablet by mouth every 6 (six) hours as needed for moderate pain. 30 tablet 0  . losartan-hydrochlorothiazide (HYZAAR) 100-25 MG per tablet TAKE 1 TABLET BY MOUTH ONCE A DAY 90 tablet 1  . metFORMIN (GLUCOPHAGE-XR) 500 MG 24 hr tablet TAKE 2 TABLETS TWICE DAILY TO = 4 TABS DAILY 360 tablet 1  . mupirocin ointment (BACTROBAN) 2 % APPLY TWICE A DAY TO SKIN INFECTION. 66 g 0  . omeprazole (PRILOSEC) 40 MG capsule Take 1 capsule (40 mg total) by mouth daily. For acid reflux 90 capsule 99  . oxybutynin (DITROPAN) 5 MG tablet TAKE 1 TABLET (5 MG TOTAL)  BY MOUTH 3 (THREE) TIMES DAILY. 90 tablet 1  . pravastatin (PRAVACHOL) 40 MG tablet TAKE 1 TABLET BY MOUTH AT BEDTIME FOR CHOLESTEROL 90 tablet 1  . silodosin (RAPAFLO) 8 MG CAPS capsule Take 8 mg by mouth at bedtime.     No current facility-administered medications on file prior to visit.   Medical History:  Past Medical History  Diagnosis Date  . Hypertension   . Hyperlipidemia   . Pre-diabetes   . Hypogonadism male   . Thyroid disease     hypothyroid  . Gout   . OSA (obstructive sleep apnea)    Allergies:  Allergies  Allergen Reactions  . Cardura [Doxazosin Mesylate] Other (See  Comments)    Nasal congestion  . Codeine Other (See Comments)    Pt passes out.  Marland Kitchen Hytrin [Terazosin]      Review of Systems:  Review of Systems  Constitutional: Negative.  Negative for fever, chills and malaise/fatigue.  HENT: Negative.   Eyes: Negative.  Negative for blurred vision.  Respiratory: Negative.   Cardiovascular: Negative.   Gastrointestinal: Negative.   Genitourinary: Negative.   Musculoskeletal: Negative.   Skin: Negative.   Neurological: Negative for dizziness, tingling, tremors, sensory change, speech change, focal weakness, seizures, loss of consciousness and headaches.  Endo/Heme/Allergies: Negative.   Psychiatric/Behavioral: Negative.     Family history- Review and unchanged Social history- Review and unchanged Physical Exam: BP 138/78 mmHg  Pulse 72  Temp(Src) 97.8 F (36.6 C)  Resp 16  Ht 5' 10.75" (1.797 m)  Wt 233 lb (105.688 kg)  BMI 32.73 kg/m2 Wt Readings from Last 3 Encounters:  02/24/15 233 lb (105.688 kg)  11/24/14 235 lb (106.595 kg)  08/19/14 232 lb (105.235 kg)   General Appearance: Well nourished, in no apparent distress. Eyes: PERRLA, EOMs, conjunctiva no swelling or erythema Sinuses: No Frontal/maxillary tenderness ENT/Mouth: Ext aud canals clear, TMs without erythema, bulging. No erythema, swelling, or exudate on post pharynx.  Tonsils not swollen or erythematous. Hearing normal.  Neck: Supple, thyroid normal.  Respiratory: Respiratory effort normal, BS equal bilaterally without rales, rhonchi, wheezing or stridor.  Cardio: RRR with no MRGs. Brisk peripheral pulses without edema.  Abdomen: Soft, + BS,  Non tender, no guarding, rebound, hernias, masses. Lymphatics: Non tender without lymphadenopathy.  Musculoskeletal: Full ROM, 5/5 strength, Normal gait Skin: Has scaly erythematous area on right neck. Warm, dry without rashes, lesions, ecchymosis.  Neuro: Cranial nerves intact. Normal muscle tone, no cerebellar symptoms. Psych:  Awake and oriented X 3, normal affect, Insight and Judgment appropriate.    Vicie Mutters, PA-C 9:42 AM [facility]

## 2015-02-24 NOTE — Patient Instructions (Addendum)
Can call Dr. Toy Cookey to get evaluated for dental sleep appliance. # [phone]  OR you can try Dr. Clearence Ped in [facility] # [phone]  We will send notes. Call and get price quote on both.   Diabetes is a very complicated disease...lets simplify it.  An easy way to look at it to understand the complications is if you think of the extra sugar floating in your blood stream as glass shards floating through your blood stream.    Diabetes affects your small vessels first: 1) The glass shards (sugar) scraps down the tiny blood vessels in your eyes and lead to diabetic retinopathy, the leading cause of blindness in the [place]. Diabetes is the leading cause of newly diagnosed adult (3 to 69 years of age) blindness in the [place].  2) The glass shards scratches down the tiny vessels of your legs leading to nerve damage called neuropathy and can lead to amputations of your feet. More than 60% of all non-traumatic amputations of lower limbs occur in people with diabetes.  3) Over time the small vessels in your brain are shredded and closed off, individually this does not cause any problems but over a long period of time many of the small vessels being blocked can lead to Vascular Dementia.   4) Your kidney's are a filter system and have a "net" that keeps certain things in the body and lets bad things out. Sugar shreds this net and leads to kidney damage and eventually failure. Decreasing the sugar that is destroying the net and certain blood pressure medications can help stop or decrease progression of kidney disease. Diabetes was the primary cause of kidney failure in 44 percent of all new cases in 2011.  5) Diabetes also destroys the small vessels in your penis that lead to erectile dysfunction. Eventually the vessels are so damaged that you may not be responsive to cialis or viagra.   Diabetes and your large vessels: Your larger vessels consist of your coronary arteries in your heart  and the carotid vessels to your brain. Diabetes or even increased sugars put you at 300% increased risk of heart attack and stroke and this is why.. The sugar scrapes down your large blood vessels and your body sees this as an internal injury and tries to repair itself. Just like you get a scab on your skin, your platelets will stick to the blood vessel wall trying to heal it. This is why we have diabetics on low dose aspirin daily, this prevents the platelets from sticking and can prevent plaque formation. In addition, your body takes cholesterol and tries to shove it into the open wound. This is why we want your LDL, or bad cholesterol, below 70.   The combination of platelets and cholesterol over 5-10 years forms plaque that can break off and cause a heart attack or stroke.   PLEASE REMEMBER:  Diabetes is preventable! Up to 69 percent of complications and morbidities among individuals with type 2 diabetes can be prevented, delayed, or effectively treated and minimized with regular visits to a health professional, appropriate monitoring and medication, and a healthy diet and lifestyle.     Bad carbs also include fruit juice, alcohol, and sweet tea. These are empty calories that do not signal to your brain that you are full.   Please remember the good carbs are still carbs which convert into sugar. So please measure them out no more than 1/2-1 cup of rice, oatmeal, pasta,  and beans  Veggies are however free foods! Pile them on.   Not all fruit is created equal. Please see the list below, the fruit at the bottom is higher in sugars than the fruit at the top. Please avoid all dried fruits.    Squamous Cell Carcinoma  Squamous cell carcinoma is the second most common form of skin cancer. It begins in the squamous cells in the outer layer of the skin (epidermis).  CAUSES  Ultraviolet light exposure is the most common cause of squamous cell carcinoma. This may come from sunlight or tanning beds.  Squamous cell carcinoma is most common in sun-exposed areas like the face, neck, arms, and hands. However, squamous cell carcinoma can occur anywhere on the body, including the lips, inside the mouth, the legs, sites of long-term (chronic) scarring, and the anus.  Other causes of squamous cell carcinoma can include:  Exposure to arsenic.  Exposure to radiation.  Exposure to toxic tars and oils. RISK FACTORS Factors that increase your risk for squamous cell carcinoma include:  Having fair skin.  Being middle-aged or elderly.  Heavy sun exposure, especially during childhood.  Repeated sunburns.  Use of tanning beds.  A weakened immune system. This includes patients who have received a transplant and patients with human immunodeficiency virus (HIV) or acquired immunodeficiency syndrome (AIDS).  Human papillomavirus infection.  Conditions that cause chronic scarring. This can include burn scars, chronic ulcers, heat (thermal) injuries, and radiation.  Exposure to psoralen plus ultraviolet A light therapy.  Exposure to chemical carcinogens, such as tar, soot, and arsenic.  Chronic inflammatory conditions such as lupus, lichen planus, or lichen sclerosus.  Chronic infections, such as infections of the bone (osteomyelitis).  Smoking. SYMPTOMS  Squamous cell carcinoma often starts as small, skin-colored (pink or brown), sandpaper-like growths. These growths are called solar keratoses or actinic keratoses. These growths are often more easily felt than seen.  DIAGNOSIS  Your caregiver may be able to tell what is wrong by doing a physical exam. Often, a tissue sample is also taken. The tissue sample is examined under a microscope.  TREATMENT  The treatment for squamous cell carcinoma depends on the size and location of the tumors, as well as your overall health. Possible treatments include:   Mohs surgery. This is a procedure done by a skin doctor (dermatologist or Mohs surgeon) in  his or her office. The cancerous cells are removed layer by layer.  Laser surgery to remove the tumor.  Freezing the tumor with liquid nitrogen (cryosurgery).  Radiation. This may be used for tumors on the face.  Electrodesiccation and curettage. This involves alternately scraping and burning the tumor, using an electric current to control bleeding. If treated soon enough, squamous cell carcinoma rarely spreads to other areas of the body (metastasizes). If left untreated, however, squamous cell carcinoma will destroy the nearby tissues. This can result in the loss of a nose or ear. PREVENTION  Avoid the sun between 10:00 a.m. and 4:00 p.m. when it is the strongest.  Use a sunscreen or sunblock with sun protection factor 30 or greater.  Apply sunscreen at least 30 minutes before exposure to the sun.  Reapply sunscreen every 2 to 4 hours while you are outside, after swimming, and after excessive sweating.  Always wear protective hats, clothing, and sunglasses with ultraviolet protection.  Avoid tanning beds. HOME CARE INSTRUCTIONS   Avoid unprotected sun exposure.  Do not smoke.  Follow your caregiver's instructions for self-exams. Look for new growths  or changes in your skin.  Keep all follow-up appointments as directed by your caregiver. SEEK MEDICAL CARE IF:   You notice any new growths or changes in your skin.  You have had a squamous cell carcinoma tumor removed and you notice a new growth in the same location. Document Released: 02/11/2003 Document Revised: 12/22/2013 Document Reviewed: 05/01/2011 [facility] Patient Information 2015 [place], [place]. This information is not intended to replace advice given to you by your health care provider. Make sure you discuss any questions you have with your health care provider.

## 2015-02-25 LAB — CBC WITH DIFFERENTIAL/PLATELET
Basophils Absolute: 0.1 10*3/uL (ref 0.0–0.1)
Basophils Relative: 1 % (ref 0–1)
EOS ABS: 0.3 10*3/uL (ref 0.0–0.7)
Eosinophils Relative: 4 % (ref 0–5)
HCT: 41.6 % (ref 39.0–52.0)
Hemoglobin: 14.3 g/dL (ref 13.0–17.0)
Lymphocytes Relative: 22 % (ref 12–46)
Lymphs Abs: 1.5 10*3/uL (ref 0.7–4.0)
MCH: 29.5 pg (ref 26.0–34.0)
MCHC: 34.4 g/dL (ref 30.0–36.0)
MCV: 86 fL (ref 78.0–100.0)
MPV: 9.8 fL (ref 8.6–12.4)
Monocytes Absolute: 0.5 10*3/uL (ref 0.1–1.0)
Monocytes Relative: 8 % (ref 3–12)
NEUTROS ABS: 4.3 10*3/uL (ref 1.7–7.7)
Neutrophils Relative %: 65 % (ref 43–77)
PLATELETS: 172 10*3/uL (ref 150–400)
RBC: 4.84 MIL/uL (ref 4.22–5.81)
RDW: 14.5 % (ref 11.5–15.5)
WBC: 6.6 10*3/uL (ref 4.0–10.5)

## 2015-02-25 LAB — HEMOGLOBIN A1C
Hgb A1c MFr Bld: 8.5 % — ABNORMAL HIGH
Mean Plasma Glucose: 197 mg/dL — ABNORMAL HIGH

## 2015-03-03 ENCOUNTER — Ambulatory Visit: Payer: Self-pay | Admitting: Physician Assistant

## 2015-03-31 ENCOUNTER — Other Ambulatory Visit: Payer: Self-pay | Admitting: Physician Assistant

## 2015-03-31 NOTE — Telephone Encounter (Signed)
Called Rx into [facility] 

## 2015-04-03 ENCOUNTER — Other Ambulatory Visit: Payer: Self-pay | Admitting: Internal Medicine

## 2015-06-01 ENCOUNTER — Encounter: Payer: Self-pay | Admitting: Internal Medicine

## 2015-06-01 ENCOUNTER — Ambulatory Visit (INDEPENDENT_AMBULATORY_CARE_PROVIDER_SITE_OTHER): Payer: Medicare Other | Admitting: Internal Medicine

## 2015-06-01 VITALS — BP 116/74 | HR 64 | Temp 97.9°F | Resp 16 | Ht 70.75 in | Wt 232.0 lb

## 2015-06-01 DIAGNOSIS — E559 Vitamin D deficiency, unspecified: Secondary | ICD-10-CM

## 2015-06-01 DIAGNOSIS — Z6832 Body mass index (BMI) 32.0-32.9, adult: Secondary | ICD-10-CM

## 2015-06-01 DIAGNOSIS — E669 Obesity, unspecified: Secondary | ICD-10-CM | POA: Insufficient documentation

## 2015-06-01 DIAGNOSIS — E785 Hyperlipidemia, unspecified: Secondary | ICD-10-CM | POA: Diagnosis not present

## 2015-06-01 DIAGNOSIS — E1121 Type 2 diabetes mellitus with diabetic nephropathy: Secondary | ICD-10-CM | POA: Diagnosis not present

## 2015-06-01 DIAGNOSIS — E1129 Type 2 diabetes mellitus with other diabetic kidney complication: Secondary | ICD-10-CM | POA: Diagnosis not present

## 2015-06-01 DIAGNOSIS — M109 Gout, unspecified: Secondary | ICD-10-CM | POA: Diagnosis not present

## 2015-06-01 DIAGNOSIS — Z79899 Other long term (current) drug therapy: Secondary | ICD-10-CM | POA: Diagnosis not present

## 2015-06-01 DIAGNOSIS — E349 Endocrine disorder, unspecified: Secondary | ICD-10-CM

## 2015-06-01 DIAGNOSIS — E291 Testicular hypofunction: Secondary | ICD-10-CM | POA: Diagnosis not present

## 2015-06-01 DIAGNOSIS — Z23 Encounter for immunization: Secondary | ICD-10-CM

## 2015-06-01 DIAGNOSIS — M1000 Idiopathic gout, unspecified site: Secondary | ICD-10-CM

## 2015-06-01 DIAGNOSIS — I10 Essential (primary) hypertension: Secondary | ICD-10-CM | POA: Diagnosis not present

## 2015-06-01 LAB — TSH: TSH: 2.81 u[IU]/mL (ref 0.350–4.500)

## 2015-06-01 LAB — CBC WITH DIFFERENTIAL/PLATELET
BASOS PCT: 0 % (ref 0–1)
Basophils Absolute: 0 10*3/uL (ref 0.0–0.1)
EOS ABS: 0.2 10*3/uL (ref 0.0–0.7)
EOS PCT: 3 % (ref 0–5)
HCT: 40.9 % (ref 39.0–52.0)
Hemoglobin: 14.6 g/dL (ref 13.0–17.0)
LYMPHS ABS: 1.9 10*3/uL (ref 0.7–4.0)
Lymphocytes Relative: 27 % (ref 12–46)
MCH: 29.9 pg (ref 26.0–34.0)
MCHC: 35.7 g/dL (ref 30.0–36.0)
MCV: 83.6 fL (ref 78.0–100.0)
MONO ABS: 0.6 10*3/uL (ref 0.1–1.0)
MONOS PCT: 9 % (ref 3–12)
MPV: 9.6 fL (ref 8.6–12.4)
NEUTROS PCT: 61 % (ref 43–77)
Neutro Abs: 4.3 10*3/uL (ref 1.7–7.7)
PLATELETS: 172 10*3/uL (ref 150–400)
RBC: 4.89 MIL/uL (ref 4.22–5.81)
RDW: 14.1 % (ref 11.5–15.5)
WBC: 7.1 10*3/uL (ref 4.0–10.5)

## 2015-06-01 LAB — LIPID PANEL
CHOL/HDL RATIO: 6.6 ratio — AB (ref <=5.0)
CHOLESTEROL: 159 mg/dL (ref 125–200)
HDL: 24 mg/dL — AB (ref >=40)
LDL Cholesterol: 93 mg/dL (ref <130)
Triglycerides: 208 mg/dL — ABNORMAL HIGH (ref <150)
VLDL: 42 mg/dL — ABNORMAL HIGH (ref <30)

## 2015-06-01 LAB — BASIC METABOLIC PANEL WITH GFR
BUN: 13 mg/dL (ref 7–25)
CALCIUM: 9.4 mg/dL (ref 8.6–10.3)
CHLORIDE: 101 mmol/L (ref 98–110)
CO2: 27 mmol/L (ref 20–31)
CREATININE: 1.18 mg/dL (ref 0.70–1.25)
GFR, Est African American: 73 mL/min (ref >=60)
GFR, Est Non African American: 63 mL/min (ref >=60)
Glucose, Bld: 128 mg/dL — ABNORMAL HIGH (ref 65–99)
Potassium: 4.3 mmol/L (ref 3.5–5.3)
SODIUM: 139 mmol/L (ref 135–146)

## 2015-06-01 LAB — HEPATIC FUNCTION PANEL
ALBUMIN: 4.5 g/dL (ref 3.6–5.1)
ALT: 22 U/L (ref 9–46)
AST: 28 U/L (ref 10–35)
Alkaline Phosphatase: 50 U/L (ref 40–115)
BILIRUBIN DIRECT: 0.1 mg/dL (ref <=0.2)
BILIRUBIN TOTAL: 0.6 mg/dL (ref 0.2–1.2)
Indirect Bilirubin: 0.5 mg/dL (ref 0.2–1.2)
Total Protein: 6.5 g/dL (ref 6.1–8.1)

## 2015-06-01 LAB — MAGNESIUM: MAGNESIUM: 1.6 mg/dL (ref 1.5–2.5)

## 2015-06-01 LAB — URIC ACID: Uric Acid, Serum: 4.8 mg/dL (ref 4.0–7.8)

## 2015-06-01 NOTE — Patient Instructions (Signed)

## 2015-06-01 NOTE — Progress Notes (Signed)
Patient ID: Jeffery Reed, male   DOB: 1946/04/17, 69 y.o.   MRN: [id number]   This very nice 69 y.o. MWM presents for 3 month follow up with Hypertension, Hyperlipidemia, T2_NIDDM and Vitamin D Deficiency.    Patient is treated for HTN since 1995 & BP has been controlled at home. Today's BP: 116/74 mmHg. Patient has had no complaints of any cardiac type chest pain, palpitations, dyspnea/orthopnea/PND, dizziness, claudication, or dependent edema.   Hyperlipidemia is not controlled with diet & meds. Patient denies myalgias or other med SE's. Last Lipids were not at goal - Cholesterol 197; HDL 26*; Elevated  LDL 120*; and Elevated Triglycerides 255 on 02/24/2015.    Also, the patient has history of PreDiabetes since 2005 and the patient is not dietary compliant and has Morbid Obesity with BMI 32.59 and was finally was started on Metformin for T2_NIDDM in Apr 2016 for A1c 7.6%.  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   He has had no symptoms of reactive hypoglycemia, diabetic polys, paresthesias or visual blurring.  Last A1c was not at goal  8.5% on  02/24/2015.   Further, the patient also has history of Vitamin D Deficiency  Of 22 in 2008 and supplements vitamin D without any suspected side-effects. Last vitamin D was 71 on    11/24/2014.  Medication Sig  . ALPRAZolam (XANAX) 1 MG tablet TAKE ONE-HALF OR ONE TABLET BY MOUTH THREE TIMES A DAY AS NEEDED  . chlorpheniramine (CHLOR-TRIMETON) 4 MG tablet Take 4 mg by mouth 2 (two) times daily as needed for allergies.  Marland Kitchen VITAMIN D 5000 UNITS TABS Take 5,000 Units by mouth daily.  . fenofibrate 134 MG capsule TAKE 1 CAPSULE (134 MG TOTAL) BY MOUTH DAILY BEFORE BREAKFAST.  Marland Kitchen FLONASE nasal spray   . losartan-hctz 100-25 MG  TAKE 1 TABLET BY MOUTH ONCE A DAY  . metFORMIN -XR 500 MG TAKE 2 TABLETS TWICE DAILY TO = 4 TABS DAILY  . mupirocin ointment (BACTROBAN) 2 % APPLY TWICE A DAY TO SKIN INFECTION.  Marland Kitchen  omeprazole  40 MG capsule Take 1 capsule (40 mg total) by mouth daily. For acid reflux  . oxybutynin 5  MG tablet TAKE 1 TABLET (5 MG TOTAL) BY MOUTH 3 (THREE) TIMES DAILY.  . pravastatin 40 MG  TAKE 1 TABLET BY MOUTH AT BEDTIME FOR CHOLESTEROL  . silodosin (RAPAFLO) 8 MG Take 8 mg by mouth at bedtime.  . NORCO 5-325 MG  Take 1 tablet by mouth every 6 (six) hours as needed for moderate pain.   Allergies  Allergen Reactions  . Cardura [Doxazosin Mesylate] Other (See Comments)    Nasal congestion  . Codeine Other (See Comments)    Pt passes out.  Marland Kitchen Hytrin [Terazosin]    PMHx:   Past Medical History  Diagnosis Date  . Hypertension   . Hyperlipidemia   . Pre-diabetes   . Hypogonadism male   . Thyroid disease     hypothyroid  . Gout   . OSA (obstructive sleep apnea)    Immunization History  Administered Date(s) Administered  . DT 06/01/2015  . Influenza Whole 06/05/2013  . Influenza, High Dose Seasonal PF 06/05/2014, 06/01/2015  . Pneumococcal Conjugate-13 08/19/2014  . Pneumococcal Polysaccharide-23 08/22/2003  . Tdap 03/08/2004  . Zoster 08/18/2013   Past Surgical History  Procedure Laterality Date  . Foot surgery  10/2009  . Tonsillectomy  1953  . Myringotomy Left 02-2005  Dr.Newman   FHx:    Reviewed / unchanged  SHx:    Reviewed / unchanged  Systems Review:  Constitutional: Denies fever, chills, wt changes, headaches, insomnia, fatigue, night sweats, change in appetite. Eyes: Denies redness, blurred vision, diplopia, discharge, itchy, watery eyes.  ENT: Denies discharge, congestion, post nasal drip, epistaxis, sore throat, earache, hearing loss, dental pain, tinnitus, vertigo, sinus pain, snoring.  CV: Denies chest pain, palpitations, irregular heartbeat, syncope, dyspnea, diaphoresis, orthopnea, PND, claudication or edema. Respiratory: denies cough, dyspnea, DOE, pleurisy, hoarseness, laryngitis, wheezing.  Gastrointestinal: Denies dysphagia, odynophagia,  heartburn, reflux, water brash, abdominal pain or cramps, nausea, vomiting, bloating, diarrhea, constipation, hematemesis, melena, hematochezia  or hemorrhoids. Genitourinary: Denies dysuria, frequency, urgency, nocturia, hesitancy, discharge, hematuria or flank pain. Musculoskeletal: Denies arthralgias, myalgias, stiffness, jt. swelling, pain, limping or strain/sprain.  Skin: Denies pruritus, rash, hives, warts, acne, eczema or change in skin lesion(s). Neuro: No weakness, tremor, incoordination, spasms, paresthesia or pain. Psychiatric: Denies confusion, memory loss or sensory loss. Endo: Denies change in weight, skin or hair change.  Heme/Lymph: No excessive bleeding, bruising or enlarged lymph nodes.  Physical Exam  BP 116/74 mmHg  Pulse 64  Temp(Src) 97.9 F (36.6 C)  Resp 16  Ht 5' 10.75" (1.797 m)  Wt 232 lb (105.235 kg)  BMI 32.59 kg/m2  Appears well nourished and in no distress. Eyes: PERRLA, EOMs, conjunctiva no swelling or erythema. Sinuses: No frontal/maxillary tenderness ENT/Mouth: EAC's clear, TM's nl w/o erythema, bulging. Nares clear w/o erythema, swelling, exudates. Oropharynx clear without erythema or exudates. Oral hygiene is good. Tongue normal, non obstructing. Hearing intact.  Neck: Supple. Thyroid nl. Car 2+/2+ without bruits, nodes or JVD. Chest: Respirations nl with BS clear & equal w/o rales, rhonchi, wheezing or stridor.  Cor: Heart sounds normal w/ regular rate and rhythm without sig. murmurs, gallops, clicks, or rubs. Peripheral pulses normal and equal  without edema.  Abdomen: Soft & bowel sounds normal. Non-tender w/o guarding, rebound, hernias, masses, or organomegaly.  Lymphatics: Unremarkable.  Musculoskeletal: Full ROM all peripheral extremities, joint stability, 5/5 strength, and normal gait.  Skin: Warm, dry without exposed rashes, lesions or ecchymosis apparent.  Neuro: Cranial nerves intact, reflexes equal bilaterally. Sensory-motor testing  grossly intact. Tendon reflexes grossly intact.  Pysch: Alert & oriented x 3.  Insight and judgement nl & appropriate. No ideations.  Assessment and Plan:  1. Essential hypertension  - TSH  2. Hyperlipidemia  - Lipid panel  3. Type II diabetes mellitus with nephropathy (HCC)  - Hemoglobin A1c - Insulin, random  4. Vitamin D deficiency  - Vit D  25 hydroxy   5. Testosterone deficiency   6. Idiopathic gout  - Uric acid  7. Medication management  - CBC with Differential/Platelet - BASIC METABOLIC PANEL WITH GFR - Hepatic function panel - Magnesium  8. BMI 32.59,  adult   9. Need for prophylactic vaccination and inoculation against influenza  - Flu vaccine HIGH DOSE PF (Fluzone High dose)  10. Need for prophylactic vaccination with tetanus-diphtheria (TD)  - DT Vaccine greater than 7yo IM  Recommended regular exercise, BP monitoring, weight control, and discussed med and SE's. Recommended labs to assess and monitor clinical status. Further disposition pending results of labs. Over 30 minutes of exam, counseling, chart review was performed

## 2015-06-02 LAB — INSULIN, RANDOM: INSULIN: 8.8 u[IU]/mL (ref 2.0–19.6)

## 2015-06-02 LAB — HEMOGLOBIN A1C
HEMOGLOBIN A1C: 8.4 % — AB (ref <5.7)
Mean Plasma Glucose: 194 mg/dL — ABNORMAL HIGH (ref <117)

## 2015-06-02 LAB — VITAMIN D 25 HYDROXY (VIT D DEFICIENCY, FRACTURES): VIT D 25 HYDROXY: 60 ng/mL (ref 30–100)

## 2015-06-04 ENCOUNTER — Other Ambulatory Visit: Payer: Self-pay | Admitting: Internal Medicine

## 2015-06-04 MED ORDER — PHENTERMINE HCL 37.5 MG PO TABS: ORAL_TABLET | ORAL | Status: DC

## 2015-06-18 ENCOUNTER — Other Ambulatory Visit: Payer: Self-pay | Admitting: Internal Medicine

## 2015-07-03 ENCOUNTER — Other Ambulatory Visit: Payer: Self-pay | Admitting: Internal Medicine

## 2015-08-09 ENCOUNTER — Other Ambulatory Visit: Payer: Self-pay

## 2015-08-09 MED ORDER — AZITHROMYCIN 250 MG PO TABS: ORAL_TABLET | ORAL | Status: AC

## 2015-08-09 MED ORDER — PREDNISONE 10 MG PO TABS: ORAL_TABLET | ORAL | Status: DC

## 2015-08-14 ENCOUNTER — Other Ambulatory Visit: Payer: Self-pay | Admitting: Internal Medicine

## 2015-08-18 ENCOUNTER — Other Ambulatory Visit: Payer: Self-pay | Admitting: *Deleted

## 2015-08-18 MED ORDER — OMEPRAZOLE 40 MG PO CPDR: DELAYED_RELEASE_CAPSULE | ORAL | Status: DC

## 2015-08-18 MED ORDER — METFORMIN HCL ER 500 MG PO TB24: ORAL_TABLET | ORAL | Status: DC

## 2015-08-18 MED ORDER — TAMSULOSIN HCL 0.4 MG PO CAPS: 0.4000 mg | ORAL_CAPSULE | Freq: Every day | ORAL | Status: DC

## 2015-08-18 MED ORDER — OXYBUTYNIN CHLORIDE 5 MG PO TABS: ORAL_TABLET | ORAL | Status: DC

## 2015-08-18 MED ORDER — ALPRAZOLAM 1 MG PO TABS: ORAL_TABLET | ORAL | Status: DC

## 2015-08-18 MED ORDER — FLUTICASONE PROPIONATE 50 MCG/ACT NA SUSP: NASAL | Status: DC

## 2015-08-18 MED ORDER — PRAVASTATIN SODIUM 40 MG PO TABS: ORAL_TABLET | ORAL | Status: DC

## 2015-08-18 MED ORDER — FENOFIBRATE MICRONIZED 134 MG PO CAPS: ORAL_CAPSULE | ORAL | Status: DC

## 2015-08-18 MED ORDER — LOSARTAN POTASSIUM-HCTZ 100-25 MG PO TABS: 1.0000 | ORAL_TABLET | Freq: Every day | ORAL | Status: DC

## 2015-08-25 DIAGNOSIS — E119 Type 2 diabetes mellitus without complications: Secondary | ICD-10-CM | POA: Diagnosis not present

## 2015-09-03 ENCOUNTER — Ambulatory Visit (INDEPENDENT_AMBULATORY_CARE_PROVIDER_SITE_OTHER): Payer: Medicare Other | Admitting: Internal Medicine

## 2015-09-03 ENCOUNTER — Encounter: Payer: Self-pay | Admitting: Internal Medicine

## 2015-09-03 VITALS — BP 130/68 | HR 76 | Temp 98.1°F | Resp 16 | Ht 70.5 in | Wt 223.8 lb

## 2015-09-03 DIAGNOSIS — E785 Hyperlipidemia, unspecified: Secondary | ICD-10-CM

## 2015-09-03 DIAGNOSIS — Z1331 Encounter for screening for depression: Secondary | ICD-10-CM

## 2015-09-03 DIAGNOSIS — I10 Essential (primary) hypertension: Secondary | ICD-10-CM | POA: Diagnosis not present

## 2015-09-03 DIAGNOSIS — N320 Bladder-neck obstruction: Secondary | ICD-10-CM

## 2015-09-03 DIAGNOSIS — Z125 Encounter for screening for malignant neoplasm of prostate: Secondary | ICD-10-CM

## 2015-09-03 DIAGNOSIS — E1129 Type 2 diabetes mellitus with other diabetic kidney complication: Secondary | ICD-10-CM | POA: Diagnosis not present

## 2015-09-03 DIAGNOSIS — M1000 Idiopathic gout, unspecified site: Secondary | ICD-10-CM | POA: Diagnosis not present

## 2015-09-03 DIAGNOSIS — E349 Endocrine disorder, unspecified: Secondary | ICD-10-CM

## 2015-09-03 DIAGNOSIS — Z79899 Other long term (current) drug therapy: Secondary | ICD-10-CM

## 2015-09-03 DIAGNOSIS — Z1389 Encounter for screening for other disorder: Secondary | ICD-10-CM

## 2015-09-03 DIAGNOSIS — E559 Vitamin D deficiency, unspecified: Secondary | ICD-10-CM | POA: Diagnosis not present

## 2015-09-03 DIAGNOSIS — E291 Testicular hypofunction: Secondary | ICD-10-CM

## 2015-09-03 DIAGNOSIS — E1121 Type 2 diabetes mellitus with diabetic nephropathy: Secondary | ICD-10-CM | POA: Diagnosis not present

## 2015-09-03 DIAGNOSIS — Z1212 Encounter for screening for malignant neoplasm of rectum: Secondary | ICD-10-CM

## 2015-09-03 LAB — BASIC METABOLIC PANEL WITH GFR
BUN: 16 mg/dL (ref 7–25)
CO2: 25 mmol/L (ref 20–31)
Calcium: 10.1 mg/dL (ref 8.6–10.3)
Chloride: 101 mmol/L (ref 98–110)
Creat: 1.2 mg/dL (ref 0.70–1.25)
GFR, EST AFRICAN AMERICAN: 71 mL/min (ref >=60)
GFR, EST NON AFRICAN AMERICAN: 62 mL/min (ref >=60)
Glucose, Bld: 118 mg/dL — ABNORMAL HIGH (ref 65–99)
POTASSIUM: 4.1 mmol/L (ref 3.5–5.3)
Sodium: 140 mmol/L (ref 135–146)

## 2015-09-03 LAB — CBC WITH DIFFERENTIAL/PLATELET
BASOS ABS: 0 10*3/uL (ref 0.0–0.1)
Basophils Relative: 0 % (ref 0–1)
EOS ABS: 0.2 10*3/uL (ref 0.0–0.7)
EOS PCT: 4 % (ref 0–5)
HEMATOCRIT: 42.8 % (ref 39.0–52.0)
Hemoglobin: 14.4 g/dL (ref 13.0–17.0)
LYMPHS PCT: 28 % (ref 12–46)
Lymphs Abs: 1.6 10*3/uL (ref 0.7–4.0)
MCH: 28.6 pg (ref 26.0–34.0)
MCHC: 33.6 g/dL (ref 30.0–36.0)
MCV: 84.9 fL (ref 78.0–100.0)
MPV: 9.8 fL (ref 8.6–12.4)
Monocytes Absolute: 0.6 10*3/uL (ref 0.1–1.0)
Monocytes Relative: 10 % (ref 3–12)
NEUTROS PCT: 58 % (ref 43–77)
Neutro Abs: 3.4 10*3/uL (ref 1.7–7.7)
PLATELETS: 198 10*3/uL (ref 150–400)
RBC: 5.04 MIL/uL (ref 4.22–5.81)
RDW: 13.9 % (ref 11.5–15.5)
WBC: 5.8 10*3/uL (ref 4.0–10.5)

## 2015-09-03 LAB — LIPID PANEL
CHOL/HDL RATIO: 5.5 ratio — AB (ref <=5.0)
Cholesterol: 144 mg/dL (ref 125–200)
HDL: 26 mg/dL — AB (ref >=40)
LDL CALC: 86 mg/dL (ref <130)
TRIGLYCERIDES: 158 mg/dL — AB (ref <150)
VLDL: 32 mg/dL — AB (ref <30)

## 2015-09-03 LAB — HEPATIC FUNCTION PANEL
ALK PHOS: 45 U/L (ref 40–115)
ALT: 15 U/L (ref 9–46)
AST: 20 U/L (ref 10–35)
Albumin: 4.4 g/dL (ref 3.6–5.1)
BILIRUBIN INDIRECT: 0.4 mg/dL (ref 0.2–1.2)
BILIRUBIN TOTAL: 0.5 mg/dL (ref 0.2–1.2)
Bilirubin, Direct: 0.1 mg/dL (ref <=0.2)
Total Protein: 6.5 g/dL (ref 6.1–8.1)

## 2015-09-03 LAB — MAGNESIUM: Magnesium: 1.7 mg/dL (ref 1.5–2.5)

## 2015-09-03 LAB — HEMOGLOBIN A1C
Hgb A1c MFr Bld: 6.7 % — ABNORMAL HIGH (ref <5.7)
Mean Plasma Glucose: 146 mg/dL — ABNORMAL HIGH (ref <117)

## 2015-09-03 NOTE — Progress Notes (Addendum)
Patient ID: Jeffery Reed, male   DOB: [birth year], 70 y.o.   MRN: [id number]  Comprehensive Evaluation & Examination  This very nice 70 y.o. MWM presents for presents for a comprehensive evaluation and management of multiple medical co-morbidities.  Patient has been followed for HTN, T2_NIDDM  , Hyperlipidemia and Vitamin D Deficiency. Patient also has GERD which is controlled on meds and lastly has BPH / OAB sx's and is followed by Dr Diona Fanti 1-2 x/year.    HTN predates since 1995. Patient's BP has been controlled at home.Today's BP: 130/68 mmHg. Patient denies any cardiac symptoms as chest pain, palpitations, shortness of breath, dizziness or ankle swelling.   Patient's hyperlipidemia is controlled with diet and medications. Patient denies myalgias or other medication SE's. Last lipids were  Cholesterol 159; HDL 24*; LDL 93; Triglycerides 208 on 06/01/2015.   Patient has hx/o Morbid Obesity  (BMI 31.65) and consequent prediabetes since 2008 and subsequent T2_NIDDM in 2013 with A1c 6.8% and patient denies reactive hypoglycemic symptoms, visual blurring, diabetic polys or paresthesias. He admits despite phentermine that he's had poor dietary compliance and also has not been monitoring his CBG's. Last A1c was 8.4% on 06/01/2015.   Patient has been on Thyroid replacement Aug 2013. Finally, patient has history of Vitamin D Deficiency of "22" in 2008 and last vitamin D was 60 on 06/01/2015.  Medication Sig  . ALPRAZolam  1 MG tablet TAKE ONE-HALF OR ONE TAB THREE TIMES A DAY AS NEEDED  . aspirin 81 MG tablet Take 81 mg  daily.  . chlorpheniramine  4 MG tablet Take  2  times daily as needed for allergies.  Marland Kitchen VITAMIN D 5000 UNITS TABS Take 5,000 Units  2  times daily.   . fenofibrate  134 MG capsule TAKE 1 CAPS DAILY BEFORE BREAKFAST.  Marland Kitchen FLONASE nasal spray Place 1 to 2 sprays in each nostril daily.  Marland Kitchen losartan-hctz 100-25 MG tablet Take 1 tab daily.  . metFORMIN-XR 500 MG  TAKE 2 TABS TWICE  DAILY = 4 TABS DAILY  . mupirocin oint (BACTROBAN) 2 % APPLY TWICE A DAY TO SKIN INFECTION.  Marland Kitchen omeprazole  40 MG capsule TAKE 1 CAP  DAILY. FOR ACID REFLUX  . oxybutynin 5 MG tablet TAKE 1 TAB 3  TIMES DAILY.  Marland Kitchen phentermine  37.5 MG tablet Take 1/2 to 1 tablet every morning for dieting & weight loss  . pravastatin  40 MG tablet TAKE 1 TAB AT BEDTIME FOR CHOLESTEROL  . tamsulosin  0.4 MG CAPS capsule Take 1 cap daily.   Allergies  Allergen Reactions  . Cardura [Doxazosin Mesylate] Other (See Comments)    Nasal congestion  . Codeine Other (See Comments)    Pt passes out.  Marland Kitchen Hytrin [Terazosin]    Past Medical History  Diagnosis Date  . Hypertension   . Hyperlipidemia   . Pre-diabetes   . Hypogonadism male   . Thyroid disease     hypothyroid  . Gout   . OSA (obstructive sleep apnea)    Health Maintenance  Topic Date Due  . Hepatitis C Screening  Dec 14, 1945  . OPHTHALMOLOGY EXAM  07/12/1957  . TETANUS/TDAP  03/08/2014  . FOOT EXAM  08/20/2015  . PNA vac Low Risk Adult (2 of 2 - PPSV23) 08/20/2015  . HEMOGLOBIN A1C  11/30/2015  . INFLUENZA VACCINE  03/21/2016  . COLONOSCOPY  10/13/2018  . ZOSTAVAX  Completed   Immunization History  Administered Date(s) Administered  . DT 06/01/2015  .  Influenza Whole 06/05/2013  . Influenza, High Dose Seasonal PF 06/05/2014, 06/01/2015  . Pneumococcal Conjugate-13 08/19/2014  . Pneumococcal Polysaccharide-23 08/22/2003  . Tdap 03/08/2004  . Zoster 08/18/2013   Past Surgical History  Procedure Laterality Date  . Foot surgery  10/2009  . Tonsillectomy  1953  . Myringotomy Left 02-2005    Dr.Newman   Family History  Problem Relation Age of Onset  . Hypertension Mother   . GER disease Mother   . Hyperlipidemia Father   . Hypertension Father   . Parkinson's disease Father   . Hypertension Brother   . Diabetes Maternal Grandmother   . Stroke Paternal Grandmother   . Diabetes Paternal Grandmother   . Stroke Paternal Grandfather      Social History   Social History  . Marital Status: Married    Spouse Name: N/A  . Number of Children: N/A  . Years of Education: N/A   Occupational History  . Not on file.   Social History Main Topics  . Smoking status: Never Smoker   . Smokeless tobacco: Not on file  . Alcohol Use: No  . Drug Use: No  . Sexual Activity: Not on file   Other Topics Concern  . Not on file   Social History Narrative    ROS Constitutional: Denies fever, chills, weight loss/gain, headaches, insomnia,  night sweats or change in appetite. Does c/o fatigue. Eyes: Denies redness, blurred vision, diplopia, discharge, itchy or watery eyes.  ENT: Denies discharge, congestion, post nasal drip, epistaxis, sore throat, earache, hearing loss, dental pain, Tinnitus, Vertigo, Sinus pain or snoring.  Cardio: Denies chest pain, palpitations, irregular heartbeat, syncope, dyspnea, diaphoresis, orthopnea, PND, claudication or edema Respiratory: denies cough, dyspnea, DOE, pleurisy, hoarseness, laryngitis or wheezing.  Gastrointestinal: Denies dysphagia, heartburn, reflux, water brash, pain, cramps, nausea, vomiting, bloating, diarrhea, constipation, hematemesis, melena, hematochezia, jaundice or hemorrhoids Genitourinary: Denies dysuria, frequency,nocturia, hesitancy, discharge, hematuria or flank pain. Occasional urge incontinence.  Musculoskeletal: Denies arthralgia, myalgia, stiffness, Jt. Swelling, pain, limp or strain/sprain. Denies Falls. Skin: Denies puritis, rash, hives, warts, acne, eczema or change in skin lesion Neuro: No weakness, tremor, incoordination, spasms, paresthesia or pain Psychiatric: Denies confusion, memory loss or sensory loss. Denies Depression. Endocrine: Denies change in weight, skin, hair change, nocturia, and paresthesia, diabetic polys, visual blurring or hyper / hypo glycemic episodes.  Heme/Lymph: No excessive bleeding, bruising or enlarged lymph nodes.  Physical Exam  BP  130/68 mmHg  Pulse 76  Temp(Src) 98.1 F (36.7 C)  Resp 16  Ht 5' 10.5" (1.791 m)  Wt 223 lb 12.8 oz (101.515 kg)  BMI 31.65 kg/m2  General Appearance: Well nourished, in no apparent distress. Eyes: PERRLA, EOMs, conjunctiva no swelling or erythema, normal fundi and vessels. Sinuses: No frontal/maxillary tenderness ENT/Mouth: EACs patent / TMs  nl. Nares clear without erythema, swelling, mucoid exudates. Oral hygiene is good. No erythema, swelling, or exudate. Tongue normal, non-obstructing. Tonsils not swollen or erythematous. Hearing normal.  Neck: Supple, thyroid normal. No bruits, nodes or JVD. Respiratory: Respiratory effort normal.  BS equal and clear bilateral without rales, rhonci, wheezing or stridor. Cardio: Heart sounds are normal with regular rate and rhythm and no murmurs, rubs or gallops. Peripheral pulses are normal and equal bilaterally without edema. No aortic or femoral bruits. Chest: symmetric with normal excursions and percussion.  Abdomen: Soft, with Nl bowel sounds. Nontender, no guarding, rebound, hernias, masses, or organomegaly.  Lymphatics: Non tender without lymphadenopathy.  Genitourinary: No hernias.Testes nl. DRE - prostate nl  for age - smooth & firm w/o nodules. Musculoskeletal: Full ROM all peripheral extremities, joint stability, 5/5 strength, and normal gait. Skin: Warm and dry without rashes, lesions, cyanosis, clubbing or  ecchymosis.  Neuro: Cranial nerves intact, reflexes equal bilaterally. Normal muscle tone, no cerebellar symptoms. Sensation intact bilaterally to touch, vibratory and Monofilament testing.  Pysch: Alert and oriented X 3 with normal affect, insight and judgment appropriate.   Assessment and Plan  1. Essential hypertension  - EKG 12-Lead - [place], RETROPERITNL ABD,  LTD - TSH  2. Hyperlipidemia  - Lipid panel - TSH  3. Type II diabetes mellitus with nephropathy (HCC)  - Microalbumin / creatinine urine ratio - [facility] DIABETES FOOT  EXAM - LOW EXTREMITY NEUR EXAM DOCUM - Hemoglobin A1c - Insulin, random  4. Vitamin D deficiency   5. Testosterone deficiency  - Testosterone  6. Idiopathic gout, unspecified chronicity   7. Screening for rectal cancer  - POC Hemoccult Bld/Stl   8. Prostate cancer screening  - PSA  9. Depression screen   10. Bladder neck obstruction  - PSA  11. Medication management  - Urinalysis, Routine w reflex microscopic  - CBC with Differential/Platelet - BASIC METABOLIC PANEL WITH GFR - Hepatic function panel - Magnesium   Continue prudent diet as discussed, weight control, BP monitoring, regular exercise, and medications as discussed.  Discussed med effects and SE's. Routine screening labs and tests as requested with regular follow-up as recommended. Over 40 minutes of exam, counseling, chart review and high complex critical decision making was performed

## 2015-09-03 NOTE — Patient Instructions (Signed)

## 2015-09-03 NOTE — Addendum Note (Signed)
Addended by: Unk Pinto on: 09/03/2015 06:27 PM   Modules accepted: Orders, Medications

## 2015-09-04 LAB — MICROALBUMIN / CREATININE URINE RATIO
CREATININE, URINE: 139 mg/dL (ref 20–370)
Microalb Creat Ratio: 3 mcg/mg creat (ref <30)
Microalb, Ur: 0.4 mg/dL

## 2015-09-04 LAB — URINALYSIS, ROUTINE W REFLEX MICROSCOPIC
BILIRUBIN URINE: NEGATIVE
GLUCOSE, UA: NEGATIVE
Hgb urine dipstick: NEGATIVE
KETONES UR: NEGATIVE
Leukocytes, UA: NEGATIVE
Nitrite: NEGATIVE
PH: 6 (ref 5.0–8.0)
Protein, ur: NEGATIVE
SPECIFIC GRAVITY, URINE: 1.018 (ref 1.001–1.035)

## 2015-09-04 LAB — TSH: TSH: 2.867 u[IU]/mL (ref 0.350–4.500)

## 2015-09-04 LAB — VITAMIN D 25 HYDROXY (VIT D DEFICIENCY, FRACTURES): Vit D, 25-Hydroxy: 63 ng/mL (ref 30–100)

## 2015-09-04 LAB — PSA: PSA: 0.72 ng/mL (ref <=4.00)

## 2015-09-04 LAB — INSULIN, RANDOM: INSULIN: 13.1 u[IU]/mL (ref 2.0–19.6)

## 2015-09-04 LAB — TESTOSTERONE: Testosterone: 307 ng/dL (ref 250–827)

## 2015-09-30 ENCOUNTER — Other Ambulatory Visit: Payer: Self-pay | Admitting: *Deleted

## 2015-09-30 MED ORDER — FENOFIBRATE 160 MG PO TABS: 160.0000 mg | ORAL_TABLET | Freq: Every day | ORAL | Status: DC

## 2015-10-06 ENCOUNTER — Other Ambulatory Visit: Payer: Self-pay | Admitting: Internal Medicine

## 2015-10-06 DIAGNOSIS — J209 Acute bronchitis, unspecified: Secondary | ICD-10-CM

## 2015-10-06 MED ORDER — PREDNISONE 20 MG PO TABS: ORAL_TABLET | ORAL | Status: DC

## 2015-10-06 MED ORDER — AZITHROMYCIN 250 MG PO TABS: ORAL_TABLET | ORAL | Status: DC

## 2015-10-06 MED ORDER — PROMETHAZINE-DM 6.25-15 MG/5ML PO SYRP: ORAL_SOLUTION | ORAL | Status: DC

## 2015-10-06 MED ORDER — PROMETHAZINE-DM 6.25-15 MG/5ML PO SYRP: ORAL_SOLUTION | ORAL | Status: AC

## 2015-10-09 ENCOUNTER — Encounter: Payer: Self-pay | Admitting: *Deleted

## 2015-11-29 ENCOUNTER — Other Ambulatory Visit: Payer: Self-pay | Admitting: *Deleted

## 2015-11-29 MED ORDER — ALPRAZOLAM 1 MG PO TABS: ORAL_TABLET | ORAL | Status: DC

## 2015-12-10 ENCOUNTER — Ambulatory Visit (INDEPENDENT_AMBULATORY_CARE_PROVIDER_SITE_OTHER): Payer: Medicare Other | Admitting: Internal Medicine

## 2015-12-10 ENCOUNTER — Encounter: Payer: Self-pay | Admitting: Internal Medicine

## 2015-12-10 VITALS — BP 136/70 | HR 68 | Temp 98.2°F | Resp 16 | Ht 70.75 in | Wt 221.0 lb

## 2015-12-10 DIAGNOSIS — N401 Enlarged prostate with lower urinary tract symptoms: Secondary | ICD-10-CM

## 2015-12-10 DIAGNOSIS — Z0001 Encounter for general adult medical examination with abnormal findings: Secondary | ICD-10-CM | POA: Diagnosis not present

## 2015-12-10 DIAGNOSIS — Z0000 Encounter for general adult medical examination without abnormal findings: Secondary | ICD-10-CM

## 2015-12-10 DIAGNOSIS — R6889 Other general symptoms and signs: Secondary | ICD-10-CM | POA: Diagnosis not present

## 2015-12-10 DIAGNOSIS — E1121 Type 2 diabetes mellitus with diabetic nephropathy: Secondary | ICD-10-CM

## 2015-12-10 DIAGNOSIS — E559 Vitamin D deficiency, unspecified: Secondary | ICD-10-CM

## 2015-12-10 DIAGNOSIS — I10 Essential (primary) hypertension: Secondary | ICD-10-CM

## 2015-12-10 DIAGNOSIS — M1000 Idiopathic gout, unspecified site: Secondary | ICD-10-CM

## 2015-12-10 DIAGNOSIS — Z79899 Other long term (current) drug therapy: Secondary | ICD-10-CM

## 2015-12-10 DIAGNOSIS — N138 Other obstructive and reflux uropathy: Secondary | ICD-10-CM

## 2015-12-10 DIAGNOSIS — E1129 Type 2 diabetes mellitus with other diabetic kidney complication: Secondary | ICD-10-CM | POA: Diagnosis not present

## 2015-12-10 DIAGNOSIS — Z23 Encounter for immunization: Secondary | ICD-10-CM

## 2015-12-10 DIAGNOSIS — Z6832 Body mass index (BMI) 32.0-32.9, adult: Secondary | ICD-10-CM

## 2015-12-10 DIAGNOSIS — E785 Hyperlipidemia, unspecified: Secondary | ICD-10-CM

## 2015-12-10 DIAGNOSIS — E291 Testicular hypofunction: Secondary | ICD-10-CM | POA: Diagnosis not present

## 2015-12-10 DIAGNOSIS — G4733 Obstructive sleep apnea (adult) (pediatric): Secondary | ICD-10-CM

## 2015-12-10 DIAGNOSIS — E349 Endocrine disorder, unspecified: Secondary | ICD-10-CM

## 2015-12-10 LAB — CBC WITH DIFFERENTIAL/PLATELET
BASOS PCT: 0 %
Basophils Absolute: 0 cells/uL (ref 0–200)
EOS PCT: 3 %
Eosinophils Absolute: 213 cells/uL (ref 15–500)
HEMATOCRIT: 42.5 % (ref 38.5–50.0)
HEMOGLOBIN: 14.2 g/dL (ref 13.2–17.1)
LYMPHS ABS: 1633 {cells}/uL (ref 850–3900)
Lymphocytes Relative: 23 %
MCH: 28.5 pg (ref 27.0–33.0)
MCHC: 33.4 g/dL (ref 32.0–36.0)
MCV: 85.3 fL (ref 80.0–100.0)
MONO ABS: 710 {cells}/uL (ref 200–950)
MPV: 10 fL (ref 7.5–12.5)
Monocytes Relative: 10 %
NEUTROS PCT: 64 %
Neutro Abs: 4544 cells/uL (ref 1500–7800)
Platelets: 172 10*3/uL (ref 140–400)
RBC: 4.98 MIL/uL (ref 4.20–5.80)
RDW: 14.8 % (ref 11.0–15.0)
WBC: 7.1 10*3/uL (ref 3.8–10.8)

## 2015-12-10 LAB — LIPID PANEL
CHOL/HDL RATIO: 5.7 ratio — AB (ref <=5.0)
Cholesterol: 176 mg/dL (ref 125–200)
HDL: 31 mg/dL — AB (ref >=40)
LDL CALC: 111 mg/dL (ref <130)
TRIGLYCERIDES: 170 mg/dL — AB (ref <150)
VLDL: 34 mg/dL — AB (ref <30)

## 2015-12-10 LAB — HEPATIC FUNCTION PANEL
ALK PHOS: 44 U/L (ref 40–115)
ALT: 17 U/L (ref 9–46)
AST: 22 U/L (ref 10–35)
Albumin: 4.4 g/dL (ref 3.6–5.1)
BILIRUBIN DIRECT: 0.1 mg/dL (ref <=0.2)
BILIRUBIN INDIRECT: 0.4 mg/dL (ref 0.2–1.2)
Total Bilirubin: 0.5 mg/dL (ref 0.2–1.2)
Total Protein: 6.7 g/dL (ref 6.1–8.1)

## 2015-12-10 LAB — BASIC METABOLIC PANEL WITH GFR
BUN: 18 mg/dL (ref 7–25)
CALCIUM: 9.6 mg/dL (ref 8.6–10.3)
CO2: 25 mmol/L (ref 20–31)
Chloride: 105 mmol/L (ref 98–110)
Creat: 1.24 mg/dL (ref 0.70–1.25)
GFR, EST AFRICAN AMERICAN: 69 mL/min (ref >=60)
GFR, EST NON AFRICAN AMERICAN: 59 mL/min — AB (ref >=60)
Glucose, Bld: 140 mg/dL — ABNORMAL HIGH (ref 65–99)
POTASSIUM: 4 mmol/L (ref 3.5–5.3)
SODIUM: 141 mmol/L (ref 135–146)

## 2015-12-10 LAB — HEMOGLOBIN A1C
HEMOGLOBIN A1C: 6.6 % — AB (ref <5.7)
Mean Plasma Glucose: 143 mg/dL

## 2015-12-10 LAB — TSH: TSH: 2.76 mIU/L (ref 0.40–4.50)

## 2015-12-10 NOTE — Progress Notes (Signed)
MEDICARE ANNUAL WELLNESS VISIT AND FOLLOW UP Assessment:    1. Essential hypertension -well controlled currently -cont meds -dash diet -monitor at home - TSH  2. OSA (obstructive sleep apnea) -cont use of cpap machine -sleep hygiene -lose weight  3. Type II diabetes mellitus with nephropathy (HCC) -working hard on losing weight -cont diet and exercise -hopefully can decrease medications - Hemoglobin A1c  4. BPH/Prostatism -cont meds -currently without change from prior visit -followed by urology  5. Need for prophylactic vaccination against Streptococcus pneumoniae (pneumococcus) -given today - Pneumococcal polysaccharide vaccine 23-valent greater than or equal to 2yo subcutaneous/IM  6. Hyperlipidemia -cont meds -diet and exercise - Lipid panel  7. Testosterone deficiency -not currently on therapy  8. Idiopathic gout, unspecified chronicity, unspecified site -cont diet -tart cherry juice recommended  9. Vitamin D deficiency -cont supplement  10. Medication management  - CBC with Differential/Platelet - BASIC METABOLIC PANEL WITH GFR - Hepatic function panel  11. BMI 32.59,  adult -diet and exercise recommended  12. Medicare annual wellness visit, initial -due next year    Over 30 minutes of exam, counseling, chart review, and critical decision making was performed  Plan:   During the course of the visit the patient was educated and counseled about appropriate screening and preventive services including:    Pneumococcal vaccine   Influenza vaccine  Prevnar 13  Td vaccine  Screening electrocardiogram  Colorectal cancer screening  Diabetes screening  Glaucoma screening  Nutrition counseling   Conditions/risks identified: BMI: Discussed weight loss, diet, and increase physical activity.  Increase physical activity: AHA recommends 150 minutes of physical activity a week.  Medications reviewed Diabetes is not at goal, ACE/ARB  therapy: Yes. Urinary Incontinence is not an issue: discussed non pharmacology and pharmacology options.  Fall risk: low- discussed PT, home fall assessment, medications.    Subjective:  Jeffery Reed is a 70 y.o. male who presents for Medicare Annual Wellness Visit and 3 month follow up for HTN, hyperlipidemia, prediabetes, and vitamin D Def.  Date of last medicare wellness visit was is unknown.  His blood pressure has been controlled at home, today their BP is BP: 136/70 mmHg He does not workout. He denies chest pain, shortness of breath, dizziness.  He is on cholesterol medication and denies myalgias. His cholesterol is at goal. The cholesterol last visit was:   Lab Results  Component Value Date   CHOL 176 12/10/2015   HDL 31* 12/10/2015   LDLCALC 111 12/10/2015   TRIG 170* 12/10/2015   CHOLHDL 5.7* 12/10/2015   He has been working on diet and exercise for diabetes, and denies foot ulcerations, hyperglycemia, hypoglycemia , increased appetite, nausea, paresthesia of the feet, polydipsia, polyuria, visual disturbances, vomiting and weight loss. Last A1C in the office was:  Lab Results  Component Value Date   HGBA1C 6.6* 12/10/2015  Patient reports that he has been having blood sugars that are running from 150-200 lately.  He    Last GFR Lab Results  Component Value Date   GFRNONAA 58* 12/10/2015     Lab Results  Component Value Date   GFRAA 69 12/10/2015   Patient is on Vitamin D supplement.   Lab Results  Component Value Date   VD25OH 63 09/03/2015     Patient reports that he did have an episode of changes in his vision.  He reports that the vision was very blurred.  He reports that his balance felt a little bit off.  He hasn't been  to the eye doctor since last year.  He reports that it hasn't been too long.  He does have a small cataract.  Sugars were fine when he checked it.    Medication Review: Current Outpatient Prescriptions on File Prior to Visit   Medication Sig Dispense Refill  . ALPRAZolam (XANAX) 1 MG tablet TAKE ONE-HALF OR ONE TABLET BY MOUTH THREE TIMES A DAY AS NEEDED 270 tablet 0  . aspirin 81 MG tablet Take 81 mg by mouth daily.    Marland Kitchen azithromycin (ZITHROMAX) 250 MG tablet Take 2 tablets (500 mg) on  Day 1,  followed by 1 tablet (250 mg) once daily on Days 2 through 5. 6 each 0  . chlorpheniramine (CHLOR-TRIMETON) 4 MG tablet Take 4 mg by mouth 2 (two) times daily as needed for allergies.    . fenofibrate 160 MG tablet Take 1 tablet (160 mg total) by mouth daily. 90 tablet 1  . fluticasone (FLONASE) 50 MCG/ACT nasal spray Place 1 to 2 sprays in each nostril daily. 16 g 4  . losartan-hydrochlorothiazide (HYZAAR) 100-25 MG tablet Take 1 tablet by mouth daily. 90 tablet 4  . metFORMIN (GLUCOPHAGE-XR) 500 MG 24 hr tablet TAKE 2 TABLETS BY MOUTH TWICE DAILY = 4 TABS DAILY 360 tablet 4  . mupirocin ointment (BACTROBAN) 2 % APPLY TWICE A DAY TO SKIN INFECTION. 66 g 0  . omeprazole (PRILOSEC) 40 MG capsule TAKE 1 CAPSULE (40 MG TOTAL) BY MOUTH DAILY. FOR ACID REFLUX 90 capsule 4  . oxybutynin (DITROPAN) 5 MG tablet TAKE 1 TABLET (5 MG TOTAL) BY MOUTH 3 (THREE) TIMES DAILY. 270 tablet 4  . pravastatin (PRAVACHOL) 40 MG tablet TAKE 1 TABLET BY MOUTH AT BEDTIME FOR CHOLESTEROL 90 tablet 4  . tamsulosin (FLOMAX) 0.4 MG CAPS capsule Take 1 capsule (0.4 mg total) by mouth daily. 90 capsule 4   No current facility-administered medications on file prior to visit.    Current Problems (verified) Patient Active Problem List   Diagnosis Date Noted  . BMI 32.59,  adult 06/01/2015  . Medicare annual wellness visit, initial 06/01/2015  . Medication management 08/19/2014  . BPH/Prostatism 08/19/2014  . Essential hypertension 08/17/2013  . Type II diabetes mellitus with nephropathy ([place]) 08/17/2013  . Vitamin D deficiency 08/17/2013  . Hyperlipidemia   . Testosterone deficiency   . Gout   . OSA (obstructive sleep apnea)     Screening  Tests Immunization History  Administered Date(s) Administered  . DT 06/01/2015  . Influenza Whole 06/05/2013  . Influenza, High Dose Seasonal PF 06/05/2014, 06/01/2015  . Pneumococcal Conjugate-13 08/19/2014  . Pneumococcal Polysaccharide-23 08/22/2003, 12/10/2015  . Tdap 03/08/2004  . Zoster 08/18/2013    Preventative care: Last colonoscopy: 2010  Prior vaccinations: TD or Tdap: 2016  Influenza: 2016  Pneumococcal: 2016 Prevnar13: 2015 Shingles/Zostavax: 2014  Names of Other Physician/Practitioners you currently use: 1. [facility] here for primary care 2. Dr. Bridgett Larsson, eye doctor, last visit 08/2015 3. Dr. Yong Channel, dentist, last visit 2016 Patient Care Team: Unk Pinto, MD as PCP - General (Internal Medicine) Franchot Gallo, MD as Consulting Physician (Urology)  Past Surgical History  Procedure Laterality Date  . Foot surgery  10/2009  . Tonsillectomy  1953  . Myringotomy Left 02-2005    Dr.Newman   Family History  Problem Relation Age of Onset  . Hypertension Mother   . GER disease Mother   . Hyperlipidemia Father   . Hypertension Father   . Parkinson's disease  Father   . Hypertension Brother   . Diabetes Maternal Grandmother   . Stroke Paternal Grandmother   . Diabetes Paternal Grandmother   . Stroke Paternal Grandfather    Social History  Substance Use Topics  . Smoking status: Never Smoker   . Smokeless tobacco: None  . Alcohol Use: No   Allergies  Allergen Reactions  . Cardura [Doxazosin Mesylate] Other (See Comments)    Nasal congestion  . Codeine Other (See Comments)    Pt passes out.  Marland Kitchen Hytrin [Terazosin]     MEDICARE WELLNESS OBJECTIVES: Tobacco use: He does not smoke.  Patient is not a former smoker. If yes, counseling given Alcohol Current alcohol use: social drinker Diet: well balanced Physical activity: Current Exercise Habits: Home exercise routine, Type of exercise: walking (yard work), Time  (Minutes): 30, Frequency (Times/Week): 7, Weekly Exercise (Minutes/Week): 210, Intensity: Moderate Cardiac risk factors: Cardiac Risk Factors include: advanced age (>48men, >53 women);diabetes mellitus;hypertension;male gender Depression/mood screen:   Depression screen [facility] 2/9 12/10/2015  Decreased Interest 0  Down, Depressed, Hopeless 0  PHQ - 2 Score 0    ADLs:  In your present state of health, do you have any difficulty performing the following activities: 12/10/2015 09/03/2015  Hearing? N N  Vision? N N  Difficulty concentrating or making decisions? N N  Walking or climbing stairs? N N  Dressing or bathing? N N  Doing errands, shopping? N N  Preparing Food and eating ? N -  Using the Toilet? N -  In the past six months, have you accidently leaked urine? N -  Do you have problems with loss of bowel control? N -  Managing your Medications? N -  Managing your Finances? N -  Housekeeping or managing your Housekeeping? N -     Cognitive Testing  Alert? Yes  Normal Appearance?Yes  Oriented to person? Yes  Place? Yes   Time? Yes  Recall of three objects?  Yes  Can perform simple calculations? Yes  Displays appropriate judgment?Yes  Can read the correct time from a watch face?Yes  EOL planning: Does patient have an advance directive?: Yes Type of Advance Directive: Healthcare Power of Attorney, Living will Does patient want to make changes to advanced directive?: No - Patient declined Copy of advanced directive(s) in chart?: No - copy requested   Objective:   Today's Vitals   12/10/15 1005  BP: 136/70  Pulse: 68  Temp: 98.2 F (36.8 C)  TempSrc: Temporal  Resp: 16  Height: 5' 10.75" (1.797 m)  Weight: 221 lb (100.245 kg)   Body mass index is 31.04 kg/(m^2).  General appearance: alert, no distress, WD/WN, male HEENT: normocephalic, sclerae anicteric, TMs pearly, nares patent, no discharge or erythema, pharynx normal Oral cavity: MMM, no lesions Neck: supple, no  lymphadenopathy, no thyromegaly, no masses Heart: RRR, normal S1, S2, no murmurs Lungs: CTA bilaterally, no wheezes, rhonchi, or rales Abdomen: +bs, soft, non tender, non distended, no masses, no hepatomegaly, no splenomegaly Musculoskeletal: nontender, no swelling, no obvious deformity Extremities: no edema, no cyanosis, no clubbing Pulses: 2+ symmetric, upper and lower extremities, normal cap refill Neurological: alert, oriented x 3, CN2-12 intact, strength normal upper extremities and lower extremities, sensation normal throughout, DTRs 2+ throughout, no cerebellar signs, gait normal Psychiatric: normal affect, behavior normal, pleasant   Medicare Attestation I have personally reviewed: The patient's medical and social history Their use of alcohol, tobacco or illicit drugs Their current medications and supplements The patient's functional ability including ADLs,fall  risks, home safety risks, cognitive, and hearing and visual impairment Diet and physical activities Evidence for depression or mood disorders  The patient's weight, height, BMI, and visual acuity have been recorded in the chart.  I have made referrals, counseling, and provided education to the patient based on review of the above and I have provided the patient with a written personalized care plan for preventive services.     Starlyn Skeans, PA-C   12/12/2015

## 2015-12-19 ENCOUNTER — Other Ambulatory Visit: Payer: Self-pay | Admitting: Physician Assistant

## 2016-01-10 ENCOUNTER — Other Ambulatory Visit: Payer: Self-pay | Admitting: Internal Medicine

## 2016-01-26 ENCOUNTER — Other Ambulatory Visit: Payer: Self-pay | Admitting: *Deleted

## 2016-01-26 MED ORDER — FENOFIBRATE 160 MG PO TABS: 160.0000 mg | ORAL_TABLET | Freq: Every day | ORAL | Status: DC

## 2016-01-26 MED ORDER — ALPRAZOLAM 1 MG PO TABS: ORAL_TABLET | ORAL | Status: DC

## 2016-03-12 ENCOUNTER — Encounter: Payer: Self-pay | Admitting: Internal Medicine

## 2016-03-12 DIAGNOSIS — K219 Gastro-esophageal reflux disease without esophagitis: Secondary | ICD-10-CM | POA: Insufficient documentation

## 2016-03-12 NOTE — Patient Instructions (Signed)

## 2016-03-12 NOTE — Progress Notes (Signed)
[facility]                       Unk Pinto, M.D.        Uvaldo Bristle. Silverio Lay, P.A.-C       Starlyn Skeans, P.A.-C   [facility]                [address] [place], [place] [id number] Telephone [phone] Telefax [phone] ______________________________________________________________________     This very nice 70 y.o. MWM presents for 6 month follow up with Hypertension, Hyperlipidemia, Pre-Diabetes, GERD, Hypothyroidism and Vitamin D Deficiency. Patient also is on CP{AP for OSA and reports improved sleep hygiene, more energy and less fatigue. Patient's GERD is controlled with diet & medications.      Patient is treated for HTN circa 1997 & BP has been controlled at home. Today's BP is 126/76. Patient has had no complaints of any cardiac type chest pain, palpitations, dyspnea/orthopnea/PND, dizziness, claudication, or dependent edema.     Hyperlipidemia is controlled with diet & meds. Patient denies myalgias or other med SE's. Last Lipids were not at goal with Cholesterol 176; HDL 31; LDL 111; Triglycerides 170 on 12/10/2015.     Also, the patient has history of Morbid Obesity (BMI 31+) and has consequent T2_NIDDM circa 2008 and has had no symptoms of reactive hypoglycemia, diabetic polys, paresthesias or visual blurring.  Last A1c was  6.6% on 12/10/2015.     Patient has been on Thyroid replacement since 03/2012. Further, the patient also has history of Vitamin D Deficiency of "22" in 2008 and supplements vitamin D without any suspected side-effects. Last vitamin D was 63 on 09/03/2015.     Medication Sig  . ALPRAZolam 1 MG  TAKE ONE-HALF OR ONE TABLET BY MOUTH THREE TIMES A DAY AS NEEDED  . aspirin 81 MG tablet Take 81 mg by mouth daily.  . chlorpheniramine 4 MG  Take 4 mg by mouth 2 (two) times daily as needed for allergies.  Marland Kitchen VITAMIN D 96295 units  Take 10,000 Units by mouth daily.  . fenofibrate  160 MG  Take 1 tablet (160 mg total) by mouth daily.  Marland Kitchen FLONASE nasal spray Place 1 to 2 sprays in each nostril daily.  Marland Kitchen losartan-hctz  100-25 MG  Take 1 tablet by mouth daily.  . metFORMIN-XR 500 MG  TAKE 2 TABLETS BY MOUTH TWICE DAILY = 4 TABS DAILY  . mupirocin oint 2 % APPLY TWICE A DAY TO SKIN INFECTION.  Marland Kitchen omeprazole  40 MG  TAKE 1 CAPSULE (40 MG TOTAL) BY MOUTH DAILY. FOR ACID REFLUX  . oxybutynin  5 MG  TAKE 1 TABLET (5 MG TOTAL) BY MOUTH 3 (THREE) TIMES DAILY.  . pravastatin40 MG  TAKE 1 TABLET BY MOUTH AT BEDTIME FOR CHOLESTEROL  . tamsulosin0.4 MG Take 1 capsule (0.4 mg total) by mouth daily.   Allergies  Allergen Reactions  . Cardura [Doxazosin Mesylate] Other (See Comments)    Nasal congestion  . Codeine Other (See Comments)    Pt passes out.  Marland Kitchen Hytrin [Terazosin]    PMHx:   Past Medical History:  Diagnosis Date  . Gout   . Hyperlipidemia   . Hypertension   . Hypogonadism male   . OSA (obstructive sleep apnea)   . Pre-diabetes   . Thyroid disease    hypothyroid  Immunization History  Administered Date(s) Administered  . DT 06/01/2015  . Influenza Whole 06/05/2013  . Influenza, High Dose Seasonal PF 06/05/2014, 06/01/2015  . Pneumococcal Conjugate-13 08/19/2014  . Pneumococcal Polysaccharide-23 08/22/2003, 12/10/2015  . Tdap 03/08/2004  . Zoster 08/18/2013   Past Surgical History:  Procedure Laterality Date  . FOOT SURGERY  10/2009  . MYRINGOTOMY Left 02-2005   Dr.Newman  . TONSILLECTOMY  1953   FHx:    Reviewed / unchanged  SHx:    Reviewed / unchanged  Systems Review:  Constitutional: Denies fever, chills, wt changes, headaches, insomnia, fatigue, night sweats, change in appetite. Eyes: Denies redness, blurred vision, diplopia, discharge, itchy, watery eyes.  ENT: Denies discharge, congestion, post nasal drip, epistaxis, sore throat, earache, hearing loss, dental pain, tinnitus, vertigo, sinus pain, snoring.  CV: Denies chest pain, palpitations,  irregular heartbeat, syncope, dyspnea, diaphoresis, orthopnea, PND, claudication or edema. Respiratory: denies cough, dyspnea, DOE, pleurisy, hoarseness, laryngitis, wheezing.  Gastrointestinal: Denies dysphagia, odynophagia, heartburn, reflux, water brash, abdominal pain or cramps, nausea, vomiting, bloating, diarrhea, constipation, hematemesis, melena, hematochezia  or hemorrhoids. Genitourinary: Denies dysuria, frequency, urgency, nocturia, hesitancy, discharge, hematuria or flank pain. Musculoskeletal: Denies arthralgias, myalgias, stiffness, jt. swelling, pain, limping or strain/sprain.  Skin: Denies pruritus, rash, hives, warts, acne, eczema or change in skin lesion(s). Neuro: No weakness, tremor, incoordination, spasms, paresthesia or pain. Psychiatric: Denies confusion, memory loss or sensory loss. Endo: Denies change in weight, skin or hair change.  Heme/Lymph: No excessive bleeding, bruising or enlarged lymph nodes.  Physical Exam  BP 126/76   Pulse 72   Temp 97.3 F (36.3 C)   Resp 16   Ht 5' 10.75" (1.797 m)   Wt 222 lb 6.4 oz (100.9 kg)   BMI 31.24 kg/m   Appears over nourished and in no distress.  Eyes: PERRLA, EOMs, conjunctiva no swelling or erythema. Sinuses: No frontal/maxillary tenderness ENT/Mouth: EAC's clear, TM's nl w/o erythema, bulging. Nares clear w/o erythema, swelling, exudates. Oropharynx clear without erythema or exudates. Oral hygiene is good. Tongue normal, non obstructing. Hearing intact.  Neck: Supple. Thyroid nl. Car 2+/2+ without bruits, nodes or JVD. Chest: Respirations nl with BS clear & equal w/o rales, rhonchi, wheezing or stridor.  Cor: Heart sounds normal w/ regular rate and rhythm without sig. murmurs, gallops, clicks, or rubs. Peripheral pulses normal and equal  without edema.  Abdomen: Soft & bowel sounds normal. Non-tender w/o guarding, rebound, hernias, masses, or organomegaly.  Rectal : GU neg/Nl and DRE - Nl Prostate for age & hemoccult  negative.  Lymphatics: Unremarkable.  Musculoskeletal: Full ROM all peripheral extremities, joint stability, 5/5 strength, and normal gait.  Skin: Warm, dry without exposed rashes, lesions or ecchymosis apparent.  Neuro: Cranial nerves intact, reflexes equal bilaterally. Sensory-motor testing grossly intact. Tendon reflexes grossly intact.  Pysch: Alert & oriented x 3.  Insight and judgement nl & appropriate. No ideations.  Assessment and Plan: 1. Essential hypertension  - Continue medication, monitor blood pressure at home. Continue DASH diet. Reminder to go to the ER if any CP, SOB, nausea, dizziness, severe HA, changes vision/speech, left arm numbness and tingling and jaw pain. - TSH  - Continue diet/meds, exercise,& lifestyle modifications. Continue monitor periodic cholesterol/liver & renal functions  2. Hyperlipidemia  - Lipid panel - TSH  3. Type II diabetes mellitus with nephropathy (HCC)  - Continue weight loss  diet, exercise, lifestyle modifications. Monitor appropriate labs. - Hemoglobin A1c - Insulin, random  4. Vitamin D deficiency  - Continue  supplementation. - VITAMIN D 25 Hydroxy   5. Gastroesophageal reflux disease  - continue diet & medications same  6. Medication management   - CBC with Differential/Platelet - BASIC METABOLIC PANEL WITH GFR - Hepatic function panel - Magnesium   Recommended regular exercise, BP monitoring, weight control, and discussed med and SE's. Recommended labs to assess and monitor clinical status. Further disposition pending results of labs. Over 30 minutes of exam, counseling, chart review was performed

## 2016-03-13 ENCOUNTER — Ambulatory Visit (INDEPENDENT_AMBULATORY_CARE_PROVIDER_SITE_OTHER): Payer: Medicare Other | Admitting: Internal Medicine

## 2016-03-13 ENCOUNTER — Encounter: Payer: Self-pay | Admitting: Internal Medicine

## 2016-03-13 VITALS — BP 126/76 | HR 72 | Temp 97.3°F | Resp 16 | Ht 70.75 in | Wt 222.4 lb

## 2016-03-13 DIAGNOSIS — K219 Gastro-esophageal reflux disease without esophagitis: Secondary | ICD-10-CM | POA: Diagnosis not present

## 2016-03-13 DIAGNOSIS — I10 Essential (primary) hypertension: Secondary | ICD-10-CM

## 2016-03-13 DIAGNOSIS — Z79899 Other long term (current) drug therapy: Secondary | ICD-10-CM

## 2016-03-13 DIAGNOSIS — E785 Hyperlipidemia, unspecified: Secondary | ICD-10-CM

## 2016-03-13 DIAGNOSIS — E559 Vitamin D deficiency, unspecified: Secondary | ICD-10-CM | POA: Diagnosis not present

## 2016-03-13 DIAGNOSIS — E1121 Type 2 diabetes mellitus with diabetic nephropathy: Secondary | ICD-10-CM | POA: Diagnosis not present

## 2016-03-13 LAB — CBC WITH DIFFERENTIAL/PLATELET
BASOS PCT: 0 %
Basophils Absolute: 0 cells/uL (ref 0–200)
EOS ABS: 138 {cells}/uL (ref 15–500)
Eosinophils Relative: 2 %
HEMATOCRIT: 40.2 % (ref 38.5–50.0)
Hemoglobin: 13.9 g/dL (ref 13.2–17.1)
LYMPHS PCT: 13 %
Lymphs Abs: 897 cells/uL (ref 850–3900)
MCH: 29.1 pg (ref 27.0–33.0)
MCHC: 34.6 g/dL (ref 32.0–36.0)
MCV: 84.3 fL (ref 80.0–100.0)
MONO ABS: 690 {cells}/uL (ref 200–950)
MONOS PCT: 10 %
MPV: 9.9 fL (ref 7.5–12.5)
NEUTROS PCT: 75 %
Neutro Abs: 5175 cells/uL (ref 1500–7800)
PLATELETS: 153 10*3/uL (ref 140–400)
RBC: 4.77 MIL/uL (ref 4.20–5.80)
RDW: 14 % (ref 11.0–15.0)
WBC: 6.9 10*3/uL (ref 3.8–10.8)

## 2016-03-13 LAB — BASIC METABOLIC PANEL WITH GFR
BUN: 15 mg/dL (ref 7–25)
CALCIUM: 9.2 mg/dL (ref 8.6–10.3)
CO2: 26 mmol/L (ref 20–31)
CREATININE: 1.18 mg/dL (ref 0.70–1.25)
Chloride: 100 mmol/L (ref 98–110)
GFR, EST AFRICAN AMERICAN: 73 mL/min (ref >=60)
GFR, Est Non African American: 63 mL/min (ref >=60)
GLUCOSE: 176 mg/dL — AB (ref 65–99)
Potassium: 3.8 mmol/L (ref 3.5–5.3)
Sodium: 139 mmol/L (ref 135–146)

## 2016-03-13 LAB — LIPID PANEL
CHOLESTEROL: 161 mg/dL (ref 125–200)
HDL: 32 mg/dL — AB (ref >=40)
LDL CALC: 94 mg/dL (ref <130)
TRIGLYCERIDES: 175 mg/dL — AB (ref <150)
Total CHOL/HDL Ratio: 5 Ratio (ref <=5.0)
VLDL: 35 mg/dL — ABNORMAL HIGH (ref <30)

## 2016-03-13 LAB — HEPATIC FUNCTION PANEL
ALBUMIN: 4.3 g/dL (ref 3.6–5.1)
ALK PHOS: 39 U/L — AB (ref 40–115)
ALT: 15 U/L (ref 9–46)
AST: 21 U/L (ref 10–35)
BILIRUBIN TOTAL: 0.8 mg/dL (ref 0.2–1.2)
Bilirubin, Direct: 0.2 mg/dL (ref <=0.2)
Indirect Bilirubin: 0.6 mg/dL (ref 0.2–1.2)
TOTAL PROTEIN: 6.3 g/dL (ref 6.1–8.1)

## 2016-03-13 LAB — HEMOGLOBIN A1C
Hgb A1c MFr Bld: 6.8 % — ABNORMAL HIGH (ref <5.7)
MEAN PLASMA GLUCOSE: 148 mg/dL

## 2016-03-13 LAB — MAGNESIUM: MAGNESIUM: 1.5 mg/dL (ref 1.5–2.5)

## 2016-03-13 LAB — TSH: TSH: 3.86 mIU/L (ref 0.40–4.50)

## 2016-03-14 LAB — VITAMIN D 25 HYDROXY (VIT D DEFICIENCY, FRACTURES): VIT D 25 HYDROXY: 75 ng/mL (ref 30–100)

## 2016-03-14 LAB — INSULIN, RANDOM: INSULIN: 13.6 u[IU]/mL (ref 2.0–19.6)

## 2016-03-31 DIAGNOSIS — H6121 Impacted cerumen, right ear: Secondary | ICD-10-CM | POA: Diagnosis not present

## 2016-05-05 ENCOUNTER — Other Ambulatory Visit: Payer: Self-pay | Admitting: *Deleted

## 2016-05-05 MED ORDER — ALPRAZOLAM 1 MG PO TABS: ORAL_TABLET | ORAL | 1 refills | Status: DC

## 2016-06-14 ENCOUNTER — Encounter: Payer: Self-pay | Admitting: Physician Assistant

## 2016-06-14 ENCOUNTER — Ambulatory Visit (INDEPENDENT_AMBULATORY_CARE_PROVIDER_SITE_OTHER): Payer: Medicare Other | Admitting: Physician Assistant

## 2016-06-14 VITALS — BP 126/80 | HR 65 | Temp 97.9°F | Resp 16 | Ht 70.25 in | Wt 236.0 lb

## 2016-06-14 DIAGNOSIS — E1121 Type 2 diabetes mellitus with diabetic nephropathy: Secondary | ICD-10-CM

## 2016-06-14 DIAGNOSIS — I10 Essential (primary) hypertension: Secondary | ICD-10-CM | POA: Diagnosis not present

## 2016-06-14 DIAGNOSIS — Z23 Encounter for immunization: Secondary | ICD-10-CM | POA: Diagnosis not present

## 2016-06-14 DIAGNOSIS — E559 Vitamin D deficiency, unspecified: Secondary | ICD-10-CM

## 2016-06-14 DIAGNOSIS — Z79899 Other long term (current) drug therapy: Secondary | ICD-10-CM | POA: Diagnosis not present

## 2016-06-14 DIAGNOSIS — E785 Hyperlipidemia, unspecified: Secondary | ICD-10-CM | POA: Diagnosis not present

## 2016-06-14 DIAGNOSIS — J0100 Acute maxillary sinusitis, unspecified: Secondary | ICD-10-CM | POA: Diagnosis not present

## 2016-06-14 LAB — CBC WITH DIFFERENTIAL/PLATELET
BASOS PCT: 1 %
Basophils Absolute: 71 cells/uL (ref 0–200)
EOS ABS: 213 {cells}/uL (ref 15–500)
Eosinophils Relative: 3 %
HEMATOCRIT: 40.3 % (ref 38.5–50.0)
Hemoglobin: 13.9 g/dL (ref 13.2–17.1)
LYMPHS ABS: 1633 {cells}/uL (ref 850–3900)
Lymphocytes Relative: 23 %
MCH: 29.3 pg (ref 27.0–33.0)
MCHC: 34.5 g/dL (ref 32.0–36.0)
MCV: 84.8 fL (ref 80.0–100.0)
MONO ABS: 639 {cells}/uL (ref 200–950)
MPV: 9.9 fL (ref 7.5–12.5)
Monocytes Relative: 9 %
NEUTROS ABS: 4544 {cells}/uL (ref 1500–7800)
Neutrophils Relative %: 64 %
Platelets: 167 10*3/uL (ref 140–400)
RBC: 4.75 MIL/uL (ref 4.20–5.80)
RDW: 14.1 % (ref 11.0–15.0)
WBC: 7.1 10*3/uL (ref 3.8–10.8)

## 2016-06-14 LAB — BASIC METABOLIC PANEL WITH GFR
BUN: 14 mg/dL (ref 7–25)
CHLORIDE: 105 mmol/L (ref 98–110)
CO2: 24 mmol/L (ref 20–31)
Calcium: 9.4 mg/dL (ref 8.6–10.3)
Creat: 1.4 mg/dL — ABNORMAL HIGH (ref 0.70–1.25)
GFR, Est African American: 59 mL/min — ABNORMAL LOW (ref >=60)
GFR, Est Non African American: 51 mL/min — ABNORMAL LOW (ref >=60)
GLUCOSE: 175 mg/dL — AB (ref 65–99)
POTASSIUM: 3.9 mmol/L (ref 3.5–5.3)
Sodium: 140 mmol/L (ref 135–146)

## 2016-06-14 LAB — HEPATIC FUNCTION PANEL
ALBUMIN: 4.4 g/dL (ref 3.6–5.1)
ALK PHOS: 40 U/L (ref 40–115)
ALT: 26 U/L (ref 9–46)
AST: 27 U/L (ref 10–35)
BILIRUBIN INDIRECT: 0.5 mg/dL (ref 0.2–1.2)
BILIRUBIN TOTAL: 0.6 mg/dL (ref 0.2–1.2)
Bilirubin, Direct: 0.1 mg/dL (ref <=0.2)
TOTAL PROTEIN: 6.5 g/dL (ref 6.1–8.1)

## 2016-06-14 LAB — LIPID PANEL
Cholesterol: 156 mg/dL (ref 125–200)
HDL: 26 mg/dL — AB (ref >=40)
LDL CALC: 83 mg/dL (ref <130)
Total CHOL/HDL Ratio: 6 Ratio — ABNORMAL HIGH (ref <=5.0)
Triglycerides: 236 mg/dL — ABNORMAL HIGH (ref <150)
VLDL: 47 mg/dL — AB (ref <30)

## 2016-06-14 LAB — TSH: TSH: 2.41 mIU/L (ref 0.40–4.50)

## 2016-06-14 MED ORDER — PREDNISONE 20 MG PO TABS: ORAL_TABLET | ORAL | 0 refills | Status: DC

## 2016-06-14 MED ORDER — AZITHROMYCIN 250 MG PO TABS: ORAL_TABLET | ORAL | 1 refills | Status: AC

## 2016-06-14 MED ORDER — FENOFIBRATE 145 MG PO TABS: 145.0000 mg | ORAL_TABLET | Freq: Every day | ORAL | 3 refills | Status: DC

## 2016-06-14 NOTE — Patient Instructions (Signed)
HOW TO TREAT VIRAL COUGH AND COLD SYMPTOMS:  -Symptoms usually last at least 1 week with the worst symptoms being around day 4.  - colds usually start with a sore throat and end with a cough, and the cough can take 2 weeks to get better.  -No antibiotics are needed for colds, flu, sore throats, cough, bronchitis UNLESS symptoms are longer than 7 days OR if you are getting better then get drastically worse.  -There are a lot of combination medications (Dayquil, Nyquil, Vicks 44, tyelnol cold and sinus, ETC). Please look at the ingredients on the back so that you are treating the correct symptoms and not doubling up on medications/ingredients.    Medicines you can use  Nasal congestion  - pseudoephedrine (Sudafed)- behind the counter, do not use if you have high blood pressure, medicine that have -D in them.  - phenylephrine (Sudafed PE) -Dextormethorphan + chlorpheniramine (Coridcidin HBP)- okay if you have high blood pressure -Oxymetazoline (Afrin) nasal spray- LIMIT to 3 days -Saline nasal spray -Neti pot (used distilled or bottled water)  Ear pain/congestion  -pseudoephedrine (sudafed) - Nasonex/flonase nasal spray  Fever  -Acetaminophen (Tyelnol) -Ibuprofen (Advil, motrin, aleve)  Sore Throat  -Acetaminophen (Tyelnol) -Ibuprofen (Advil, motrin, aleve) -Drink a lot of water -Gargle with salt water - Rest your voice (don't talk) -Throat sprays -Cough drops  Body Aches  -Acetaminophen (Tyelnol) -Ibuprofen (Advil, motrin, aleve)  Headache  -Acetaminophen (Tyelnol) -Ibuprofen (Advil, motrin, aleve) - Exedrin, Exedrin Migraine  Allergy symptoms (cough, sneeze, runny nose, itchy eyes) -Claritin or loratadine cheapest but likely the weakest  -Zyrtec or certizine at night because it can make you sleepy -The strongest is allegra or fexafinadine  Cheapest at [employer], [employer], [employer]  Cough  -Dextromethorphan (Delsym)- medicine that has DM in it -Guafenesin  (Mucinex/Robitussin) - cough drops - drink lots of water  Chest Congestion  -Guafenesin (Mucinex/Robitussin)  Red Itchy Eyes  - Naphcon-A  Upset Stomach  - Bland diet (nothing spicy, greasy, fried, and high acid foods like tomatoes, oranges, berries) -OKAY- cereal, bread, soup, crackers, rice -Eat smaller more frequent meals -reduce caffeine, no alcohol -Loperamide (Imodium-AD) if diarrhea -Prevacid for heart burn  General health when sick  -Hydration -wash your hands frequently -keep surfaces clean -change pillow cases and sheets often -Get fresh air but do not exercise strenuously -Vitamin D, double up on it - Vitamin C -Zinc      Bad carbs also include fruit juice, alcohol, and sweet tea. These are empty calories that do not signal to your brain that you are full.   Please remember the good carbs are still carbs which convert into sugar. So please measure them out no more than 1/2-1 cup of rice, oatmeal, pasta, and beans  Veggies are however free foods! Pile them on.   Not all fruit is created equal. Please see the list below, the fruit at the bottom is higher in sugars than the fruit at the top. Please avoid all dried fruits.

## 2016-06-14 NOTE — Progress Notes (Signed)
Assessment and Plan:  Hypertension -Continue medication, monitor blood pressure at home. Continue DASH diet.  Reminder to go to the ER if any CP, SOB, nausea, dizziness, severe HA, changes vision/speech, left arm numbness and tingling and jaw pain.  Cholesterol -Continue diet and exercise. Check cholesterol.    Diabetes with CKD stage 2 -Continue diet and exercise. Check A1C - started MF last visit.   Vitamin D Def - check level and continue medications.   Obesity with co morbidities-  long discussion about weight loss, diet, and exercise  Flu vaccine need -     Flu vaccine HIGH DOSE PF  Acute maxillary sinusitis, recurrence not specified -Will hold the zpak and take if she is not getting better, increase fluids, rest, cont allergy pill -     azithromycin (ZITHROMAX) 250 MG tablet; Take 2 tablets (500 mg) on  Day 1,  followed by 1 tablet (250 mg) once daily on Days 2 through 5. -     predniSONE (DELTASONE) 20 MG tablet; 2 tablets daily for 3 days, 1 tablet daily for 4 days.     Continue diet and meds as discussed. Further disposition pending results of labs. Over 30 minutes of exam, counseling, chart review, and critical decision making was performed  HPI 70 y.o. male  presents for 3 month follow up on hypertension, cholesterol, prediabetes, and vitamin D deficiency.   His blood pressure has been controlled at home, today their BP is BP: 126/80  He does not workout. He denies chest pain, shortness of breath, dizziness.  He is on cholesterol medication, fenofibrate and fenofibrate and denies myalgias. His cholesterol is at goal. The cholesterol last visit was:   Lab Results  Component Value Date   CHOL 161 03/13/2016   HDL 32 (L) 03/13/2016   LDLCALC 94 03/13/2016   TRIG 175 (H) 03/13/2016   CHOLHDL 5.0 03/13/2016    He has been working on diet and exercise for diabetes with CKD (GFR 58), he is on 4 metformin a day, he is on bASA, he is on ARB , and denies paresthesia of  the feet, polydipsia, polyuria and visual disturbances. Last A1C in the office was:  Lab Results  Component Value Date   HGBA1C 6.8 (H) 03/13/2016   Patient is on Vitamin D supplement.   Lab Results  Component Value Date   VD25OH 75 03/13/2016   BMI is Body mass index is 33.62 kg/m., he is working on diet and exercise. Wt Readings from Last 3 Encounters:  06/14/16 236 lb (107 kg)  03/13/16 222 lb 6.4 oz (100.9 kg)  12/10/15 221 lb (100.2 kg)    Current Medications:  Current Outpatient Prescriptions on File Prior to Visit  Medication Sig Dispense Refill  . ALPRAZolam (XANAX) 1 MG tablet TAKE ONE-HALF OR ONE TABLET BY MOUTH THREE TIMES A DAY AS NEEDED 270 tablet 1  . aspirin 81 MG tablet Take 81 mg by mouth daily.    . chlorpheniramine (CHLOR-TRIMETON) 4 MG tablet Take 4 mg by mouth 2 (two) times daily as needed for allergies.    . Cholecalciferol (VITAMIN D3) 10000 units capsule Take 10,000 Units by mouth daily.    . fenofibrate 160 MG tablet Take 1 tablet (160 mg total) by mouth daily. 90 tablet 3  . fluticasone (FLONASE) 50 MCG/ACT nasal spray Place 1 to 2 sprays in each nostril daily. 16 g 4  . losartan-hydrochlorothiazide (HYZAAR) 100-25 MG tablet Take 1 tablet by mouth daily. 90 tablet 4  .  metFORMIN (GLUCOPHAGE-XR) 500 MG 24 hr tablet TAKE 2 TABLETS BY MOUTH TWICE DAILY = 4 TABS DAILY 360 tablet 4  . mupirocin ointment (BACTROBAN) 2 % APPLY TWICE A DAY TO SKIN INFECTION. 66 g 1  . omeprazole (PRILOSEC) 40 MG capsule TAKE 1 CAPSULE (40 MG TOTAL) BY MOUTH DAILY. FOR ACID REFLUX 90 capsule 4  . oxybutynin (DITROPAN) 5 MG tablet TAKE 1 TABLET (5 MG TOTAL) BY MOUTH 3 (THREE) TIMES DAILY. 270 tablet 4  . pravastatin (PRAVACHOL) 40 MG tablet TAKE 1 TABLET BY MOUTH AT BEDTIME FOR CHOLESTEROL 90 tablet 0  . tamsulosin (FLOMAX) 0.4 MG CAPS capsule Take 1 capsule (0.4 mg total) by mouth daily. 90 capsule 4   No current facility-administered medications on file prior to visit.     Medical History:  Past Medical History:  Diagnosis Date  . Gout   . Hyperlipidemia   . Hypertension   . Hypogonadism male   . OSA (obstructive sleep apnea)   . Pre-diabetes   . Thyroid disease    hypothyroid   Allergies:  Allergies  Allergen Reactions  . Cardura [Doxazosin Mesylate] Other (See Comments)    Nasal congestion  . Codeine Other (See Comments)    Pt passes out.  Marland Kitchen Hytrin [Terazosin]      Review of Systems:  Review of Systems  Constitutional: Negative.  Negative for chills, fever and malaise/fatigue.  HENT: Positive for congestion and sore throat.   Eyes: Negative.  Negative for blurred vision.  Respiratory: Positive for cough. Negative for sputum production, shortness of breath and wheezing.   Cardiovascular: Negative.   Gastrointestinal: Negative.   Genitourinary: Negative.   Musculoskeletal: Negative.   Skin: Negative.   Neurological: Negative for dizziness, tingling, tremors, sensory change, speech change, focal weakness, seizures, loss of consciousness and headaches.  Endo/Heme/Allergies: Negative.   Psychiatric/Behavioral: Negative.     Family history- Review and unchanged Social history- Review and unchanged Physical Exam: BP 126/80   Pulse 65   Temp 97.9 F (36.6 C)   Resp 16   Ht 5' 10.25" (1.784 m)   Wt 236 lb (107 kg)   SpO2 95%   BMI 33.62 kg/m  Wt Readings from Last 3 Encounters:  06/14/16 236 lb (107 kg)  03/13/16 222 lb 6.4 oz (100.9 kg)  12/10/15 221 lb (100.2 kg)   General Appearance: Well nourished, in no apparent distress. Eyes: PERRLA, EOMs, conjunctiva no swelling or erythema Sinuses: No Frontal/maxillary tenderness ENT/Mouth: Ext aud canals clear, TMs without erythema, bulging. No erythema, swelling, or exudate on post pharynx.  Tonsils not swollen or erythematous. Hearing normal.  Neck: Supple, thyroid normal.  Respiratory: Respiratory effort normal, BS equal bilaterally without rales, rhonchi, wheezing or stridor.   Cardio: RRR with no MRGs. Brisk peripheral pulses without edema.  Abdomen: Soft, + BS,  Non tender, no guarding, rebound, hernias, masses. Lymphatics: Non tender without lymphadenopathy.  Musculoskeletal: Full ROM, 5/5 strength, Normal gait Skin: Has scaly erythematous area on right neck. Warm, dry without rashes, lesions, ecchymosis.  Neuro: Cranial nerves intact. Normal muscle tone, no cerebellar symptoms. Psych: Awake and oriented X 3, normal affect, Insight and Judgment appropriate.    Vicie Mutters, PA-C 10:51 AM [facility]

## 2016-06-15 LAB — HEMOGLOBIN A1C
HEMOGLOBIN A1C: 7.4 % — AB (ref <5.7)
Mean Plasma Glucose: 166 mg/dL

## 2016-06-22 ENCOUNTER — Other Ambulatory Visit: Payer: Self-pay

## 2016-06-22 DIAGNOSIS — J0100 Acute maxillary sinusitis, unspecified: Secondary | ICD-10-CM

## 2016-06-22 DIAGNOSIS — E785 Hyperlipidemia, unspecified: Secondary | ICD-10-CM

## 2016-06-22 MED ORDER — FENOFIBRATE 160 MG PO TABS: 160.0000 mg | ORAL_TABLET | Freq: Every day | ORAL | 3 refills | Status: DC

## 2016-07-24 DIAGNOSIS — H66001 Acute suppurative otitis media without spontaneous rupture of ear drum, right ear: Secondary | ICD-10-CM | POA: Diagnosis not present

## 2016-08-04 ENCOUNTER — Telehealth: Payer: Self-pay | Admitting: *Deleted

## 2016-08-04 MED ORDER — AZITHROMYCIN 250 MG PO TABS: ORAL_TABLET | ORAL | 0 refills | Status: AC

## 2016-08-04 NOTE — Telephone Encounter (Signed)
Patient called requesting Zpak for cold and sinus symptoms.  Per Dr. Idell Pickles orders, Zpak sent into pharmacy for patient and advised him to call for ov next week if no relief from symptoms after completing abx.

## 2016-09-05 DIAGNOSIS — E119 Type 2 diabetes mellitus without complications: Secondary | ICD-10-CM | POA: Diagnosis not present

## 2016-09-21 ENCOUNTER — Ambulatory Visit (INDEPENDENT_AMBULATORY_CARE_PROVIDER_SITE_OTHER): Payer: Medicare Other | Admitting: Internal Medicine

## 2016-09-21 ENCOUNTER — Encounter: Payer: Self-pay | Admitting: Internal Medicine

## 2016-09-21 VITALS — BP 118/82 | HR 76 | Temp 97.3°F | Resp 16 | Ht 70.0 in | Wt 227.0 lb

## 2016-09-21 DIAGNOSIS — N138 Other obstructive and reflux uropathy: Secondary | ICD-10-CM | POA: Diagnosis not present

## 2016-09-21 DIAGNOSIS — E782 Mixed hyperlipidemia: Secondary | ICD-10-CM | POA: Diagnosis not present

## 2016-09-21 DIAGNOSIS — M1000 Idiopathic gout, unspecified site: Secondary | ICD-10-CM

## 2016-09-21 DIAGNOSIS — N183 Chronic kidney disease, stage 3 (moderate): Secondary | ICD-10-CM | POA: Diagnosis not present

## 2016-09-21 DIAGNOSIS — Z79899 Other long term (current) drug therapy: Secondary | ICD-10-CM | POA: Diagnosis not present

## 2016-09-21 DIAGNOSIS — Z136 Encounter for screening for cardiovascular disorders: Secondary | ICD-10-CM | POA: Diagnosis not present

## 2016-09-21 DIAGNOSIS — Z125 Encounter for screening for malignant neoplasm of prostate: Secondary | ICD-10-CM | POA: Diagnosis not present

## 2016-09-21 DIAGNOSIS — Z1212 Encounter for screening for malignant neoplasm of rectum: Secondary | ICD-10-CM

## 2016-09-21 DIAGNOSIS — E559 Vitamin D deficiency, unspecified: Secondary | ICD-10-CM

## 2016-09-21 DIAGNOSIS — I10 Essential (primary) hypertension: Secondary | ICD-10-CM

## 2016-09-21 DIAGNOSIS — G4733 Obstructive sleep apnea (adult) (pediatric): Secondary | ICD-10-CM

## 2016-09-21 DIAGNOSIS — E1122 Type 2 diabetes mellitus with diabetic chronic kidney disease: Secondary | ICD-10-CM | POA: Diagnosis not present

## 2016-09-21 DIAGNOSIS — N401 Enlarged prostate with lower urinary tract symptoms: Secondary | ICD-10-CM | POA: Diagnosis not present

## 2016-09-21 DIAGNOSIS — K219 Gastro-esophageal reflux disease without esophagitis: Secondary | ICD-10-CM | POA: Diagnosis not present

## 2016-09-21 LAB — CBC WITH DIFFERENTIAL/PLATELET
BASOS ABS: 0 {cells}/uL (ref 0–200)
Basophils Relative: 0 %
EOS PCT: 3 %
Eosinophils Absolute: 231 cells/uL (ref 15–500)
HCT: 44.3 % (ref 38.5–50.0)
HEMOGLOBIN: 15.1 g/dL (ref 13.2–17.1)
LYMPHS ABS: 2002 {cells}/uL (ref 850–3900)
Lymphocytes Relative: 26 %
MCH: 29.2 pg (ref 27.0–33.0)
MCHC: 34.1 g/dL (ref 32.0–36.0)
MCV: 85.7 fL (ref 80.0–100.0)
MONO ABS: 693 {cells}/uL (ref 200–950)
MPV: 10.2 fL (ref 7.5–12.5)
Monocytes Relative: 9 %
NEUTROS ABS: 4774 {cells}/uL (ref 1500–7800)
Neutrophils Relative %: 62 %
Platelets: 186 10*3/uL (ref 140–400)
RBC: 5.17 MIL/uL (ref 4.20–5.80)
RDW: 14.1 % (ref 11.0–15.0)
WBC: 7.7 10*3/uL (ref 3.8–10.8)

## 2016-09-21 LAB — BASIC METABOLIC PANEL WITHOUT GFR
BUN: 16 mg/dL (ref 7–25)
CO2: 26 mmol/L (ref 20–31)
Calcium: 9.7 mg/dL (ref 8.6–10.3)
Chloride: 102 mmol/L (ref 98–110)
Creat: 1.48 mg/dL — ABNORMAL HIGH (ref 0.70–1.25)
GFR, Est African American: 55 mL/min — ABNORMAL LOW
GFR, Est Non African American: 48 mL/min — ABNORMAL LOW
Glucose, Bld: 182 mg/dL — ABNORMAL HIGH (ref 65–99)
Potassium: 3.9 mmol/L (ref 3.5–5.3)
Sodium: 141 mmol/L (ref 135–146)

## 2016-09-21 LAB — HEMOGLOBIN A1C
Hgb A1c MFr Bld: 7.1 % — ABNORMAL HIGH
Mean Plasma Glucose: 157 mg/dL

## 2016-09-21 LAB — PSA: PSA: 0.7 ng/mL (ref <=4.0)

## 2016-09-21 LAB — URINALYSIS, ROUTINE W REFLEX MICROSCOPIC
Bilirubin Urine: NEGATIVE
HGB URINE DIPSTICK: NEGATIVE
Ketones, ur: NEGATIVE
LEUKOCYTES UA: NEGATIVE
Nitrite: NEGATIVE
PROTEIN: NEGATIVE
Specific Gravity, Urine: 1.019 (ref 1.001–1.035)
pH: 6 (ref 5.0–8.0)

## 2016-09-21 LAB — HEPATIC FUNCTION PANEL
ALT: 18 U/L (ref 9–46)
AST: 25 U/L (ref 10–35)
Albumin: 4.7 g/dL (ref 3.6–5.1)
Alkaline Phosphatase: 48 U/L (ref 40–115)
Bilirubin, Direct: 0.2 mg/dL
Indirect Bilirubin: 0.5 mg/dL (ref 0.2–1.2)
Total Bilirubin: 0.7 mg/dL (ref 0.2–1.2)
Total Protein: 7.2 g/dL (ref 6.1–8.1)

## 2016-09-21 LAB — LIPID PANEL
CHOL/HDL RATIO: 6.4 ratio — AB (ref <5.0)
Cholesterol: 161 mg/dL (ref <200)
HDL: 25 mg/dL — ABNORMAL LOW (ref >40)
LDL Cholesterol: 92 mg/dL (ref <100)
Triglycerides: 219 mg/dL — ABNORMAL HIGH (ref <150)
VLDL: 44 mg/dL — AB (ref <30)

## 2016-09-21 LAB — TSH: TSH: 2.24 m[IU]/L (ref 0.40–4.50)

## 2016-09-21 NOTE — Patient Instructions (Signed)

## 2016-09-21 NOTE — Progress Notes (Addendum)
[facility]   Unk Pinto, M.D.    Uvaldo Bristle. Silverio Lay, P.A.-C      Starlyn Skeans, P.A.-C  [facility]                [address] [place] [id number] Telephone [phone] Telefax [phone]  Comprehensive Evaluation & Examination     This very nice 71 y.o. MWM presents for a comprehensive evaluation and management of multiple medical co-morbidities.  Patient has been followed for HTN, T2_NIDDM  , Hyperlipidemia and Vitamin D Deficiency. Patient also has OSA on CPAP and reports improved sleep hygiene and less daytime fatigue.      HTN predates since 1997. Patient's BP has been controlled at home.  Today's BP is at goal - 118/82. Patient denies any cardiac symptoms as chest pain, palpitations, shortness of breath, dizziness or ankle swelling.     Patient's hyperlipidemia is controlled with diet and medications. Patient denies myalgias or other medication SE's. Last lipids were at goal albeit elevated Trig's: Lab Results  Component Value Date   CHOL 156 06/14/2016   HDL 26 (L) 06/14/2016   LDLCALC 83 06/14/2016   TRIG 236 (H) 06/14/2016   CHOLHDL 6.0 (H) 06/14/2016      Patient has  Morbid Obesity with PreDiabetes from 2008 with A1c 6.9% and then T2_NIDDM since 2014.  Patient denies reactive hypoglycemic symptoms, visual blurring, diabetic polys or paresthesias. Patient endorses his Gluttonous dietary indiscretions and last A1c was not at goal: Lab Results  Component Value Date   HGBA1C 7.4 (H) 06/14/2016       Finally, patient has history of Vitamin D Deficiency in 2008  of "22" and last vitamin D was at goal:  Lab Results  Component Value Date   VD25OH 7 03/13/2016   Current Outpatient Prescriptions on File Prior to Visit  Medication Sig  . ALPRAZolam (XANAX) 1 MG tablet TAKE ONE-HALF OR ONE TABLET BY MOUTH THREE TIMES A DAY AS NEEDED  . aspirin 81 MG tablet Take 81 mg by mouth  daily.  . chlorpheniramine (CHLOR-TRIMETON) 4 MG tablet Take 4 mg by mouth 2 (two) times daily as needed for allergies.  . Cholecalciferol (VITAMIN D3) 10000 units capsule Take 10,000 Units by mouth daily.  . fenofibrate 160 MG tablet Take 1 tablet (160 mg total) by mouth daily.  . fluticasone (FLONASE) 50 MCG/ACT nasal spray Place 1 to 2 sprays in each nostril daily.  Marland Kitchen losartan-hydrochlorothiazide (HYZAAR) 100-25 MG tablet Take 1 tablet by mouth daily.  . metFORMIN (GLUCOPHAGE-XR) 500 MG 24 hr tablet TAKE 2 TABLETS BY MOUTH TWICE DAILY = 4 TABS DAILY  . mupirocin ointment (BACTROBAN) 2 % APPLY TWICE A DAY TO SKIN INFECTION.  Marland Kitchen omeprazole (PRILOSEC) 40 MG capsule TAKE 1 CAPSULE (40 MG TOTAL) BY MOUTH DAILY. FOR ACID REFLUX  . oxybutynin (DITROPAN) 5 MG tablet TAKE 1 TABLET (5 MG TOTAL) BY MOUTH 3 (THREE) TIMES DAILY.  . pravastatin (PRAVACHOL) 40 MG tablet TAKE 1 TABLET BY MOUTH AT BEDTIME FOR CHOLESTEROL  . tamsulosin (FLOMAX) 0.4 MG CAPS capsule Take 1 capsule (0.4 mg total) by mouth daily.   No current facility-administered medications on file prior to visit.    Allergies  Allergen Reactions  . Cardura [Doxazosin Mesylate] Other (See Comments)    Nasal congestion  . Codeine Other (See Comments)    Pt passes out.  Marland Kitchen  Hytrin [Terazosin]    Past Medical History:  Diagnosis Date  . Gout   . Hyperlipidemia   . Hypertension   . Hypogonadism male   . OSA (obstructive sleep apnea)   . Pre-diabetes   . Thyroid disease    hypothyroid   Health Maintenance  Topic Date Due  . Hepatitis C Screening  1946-07-10  . OPHTHALMOLOGY EXAM  08/24/2016  . FOOT EXAM  09/02/2016  . HEMOGLOBIN A1C  12/13/2016  . COLONOSCOPY  10/13/2018  . TETANUS/TDAP  05/31/2025  . INFLUENZA VACCINE  Completed  . ZOSTAVAX  Completed  . PNA vac Low Risk Adult  Completed   Immunization History  Administered Date(s) Administered  . DT 06/01/2015  . Influenza Whole 06/05/2013  . Influenza, High Dose Seasonal  PF 06/05/2014, 06/01/2015, 06/14/2016  . Pneumococcal Conjugate-13 08/19/2014  . Pneumococcal Polysaccharide-23 08/22/2003, 12/10/2015  . Tdap 03/08/2004  . Zoster 08/18/2013   Past Surgical History:  Procedure Laterality Date  . FOOT SURGERY  10/2009  . MYRINGOTOMY Left 02-2005   Dr.Newman  . TONSILLECTOMY  1953   Family History  Problem Relation Age of Onset  . Hypertension Mother   . GER disease Mother   . Hyperlipidemia Father   . Hypertension Father   . Parkinson's disease Father   . Hypertension Brother   . Diabetes Maternal Grandmother   . Stroke Paternal Grandmother   . Diabetes Paternal Grandmother   . Stroke Paternal Grandfather    Social History   Social History  . Marital status: Married    Spouse name: N/A  . Number of children: N/A  . Years of education: N/A   Occupational History  . Not on file.   Social History Main Topics  . Smoking status: Never Smoker  . Smokeless tobacco: Not on file  . Alcohol use No  . Drug use: No  . Sexual activity: Not on file    ROS Constitutional: Denies fever, chills, weight loss/gain, headaches, insomnia,  night sweats or change in appetite. Does c/o fatigue. Eyes: Denies redness, blurred vision, diplopia, discharge, itchy or watery eyes.  ENT: Denies discharge, congestion, post nasal drip, epistaxis, sore throat, earache, hearing loss, dental pain, Tinnitus, Vertigo, Sinus pain or snoring.  Cardio: Denies chest pain, palpitations, irregular heartbeat, syncope, dyspnea, diaphoresis, orthopnea, PND, claudication or edema Respiratory: denies cough, dyspnea, DOE, pleurisy, hoarseness, laryngitis or wheezing.  Gastrointestinal: Denies dysphagia, heartburn, reflux, water brash, pain, cramps, nausea, vomiting, bloating, diarrhea, constipation, hematemesis, melena, hematochezia, jaundice or hemorrhoids Genitourinary: Denies dysuria, frequency, urgency, nocturia, hesitancy, discharge, hematuria or flank pain Musculoskeletal:  Denies arthralgia, myalgia, stiffness, Jt. Swelling, pain, limp or strain/sprain. Denies Falls. Skin: Denies puritis, rash, hives, warts, acne, eczema or change in skin lesion Neuro: No weakness, tremor, incoordination, spasms, paresthesia or pain Psychiatric: Denies confusion, memory loss or sensory loss. Denies Depression. Endocrine: Denies change in weight, skin, hair change, nocturia, and paresthesia, diabetic polys, visual blurring or hyper / hypo glycemic episodes.  Heme/Lymph: No excessive bleeding, bruising or enlarged lymph nodes.  Physical Exam  BP 118/82   Pulse 76   Temp 97.3 F (36.3 C)   Resp 16   Ht 5\' 10"  (1.778 m)   Wt 227 lb (103 kg)   BMI 32.57 kg/m   General Appearance: Well nourished, in no apparent distress.  Eyes: PERRLA, EOMs, conjunctiva no swelling or erythema, normal fundi and vessels. Sinuses: No frontal/maxillary tenderness ENT/Mouth: EACs patent / TMs  nl. Nares clear without erythema, swelling, mucoid exudates. Oral  hygiene is good. No erythema, swelling, or exudate. Tongue normal, non-obstructing. Tonsils not swollen or erythematous. Hearing normal.  Neck: Supple, thyroid normal. No bruits, nodes or JVD. Respiratory: Respiratory effort normal.  BS equal and clear bilateral without rales, rhonci, wheezing or stridor. Cardio: Heart sounds are normal with regular rate and rhythm and no murmurs, rubs or gallops. Peripheral pulses are normal and equal bilaterally without edema. No aortic or femoral bruits. Chest: symmetric with normal excursions and percussion.  Abdomen: Soft, with Nl bowel sounds. Nontender, no guarding, rebound, hernias, masses, or organomegaly.  Lymphatics: Non tender without lymphadenopathy.  Genitourinary: Large direct Right inguinal hernia. Testes nl. DRE - prostate nl for age - smooth & firm w/o nodules. Musculoskeletal: Full ROM all peripheral extremities, joint stability, 5/5 strength, and normal gait. Skin: Warm and dry without  rashes, lesions, cyanosis, clubbing or  ecchymosis.  Neuro: Cranial nerves intact, reflexes equal bilaterally. Normal muscle tone, no cerebellar symptoms. Sensation intact to touch, vibratory and Monofilament testing to the toes bilaterally.  Pysch: Alert and oriented X 3 with normal affect, insight and judgment appropriate.   Assessment and Plan  1. Essential hypertension  - Microalbumin / creatinine urine ratio - EKG 12-Lead - [place], RETROPERITNL ABD,  LTD - Urinalysis, Routine w reflex microscopic - CBC with Differential/Platelet - BASIC METABOLIC PANEL WITH GFR - TSH  2. Mixed hyperlipidemia  - EKG 12-Lead - [place], RETROPERITNL ABD,  LTD - Hepatic function panel - Lipid panel - TSH  3. Type 2 diabetes mellitus with stage 3 chronic kidney disease, without long-term current use of insulin (HCC)  - Microalbumin / creatinine urine ratio - EKG 12-Lead - [place], RETROPERITNL ABD,  LTD - HM DIABETES FOOT EXAM - LOW EXTREMITY NEUR EXAM DOCUM - Hemoglobin A1c - Insulin, random  4. Vitamin D deficiency  - VITAMIN D 25 Hydroxy   5. OSA (obstructive sleep apnea)   6. Gastroesophageal reflux disease   7. Idiopathic gout  - Uric acid  8. Screening for rectal cancer  - [place], RETROPERITNL ABD,  LTD - POC Hemoccult Bld/Stl   9. Prostate cancer screening  - PSA  10. BPH/Prostatism  - PSA  11. Screening for ischemic heart disease  - EKG 12-Lead  12. Screening for AAA (aortic abdominal aneurysm)  - [place], RETROPERITNL ABD,  LTD  13. Medication management  - Urinalysis, Routine w reflex microscopic - CBC with Differential/Platelet - BASIC METABOLIC PANEL WITH GFR - Hepatic function panel - Magnesium - Lipid panel - Hemoglobin A1c - Insulin, random - VITAMIN D 25 Hydroxy        Continue prudent diet as discussed, weight control, BP monitoring, regular exercise, and medications as discussed.  Discussed med effects and SE's. Routine screening labs and tests as requested  with regular follow-up as recommended. Over 40 minutes of exam, counseling, chart review and high complex critical decision making was performed

## 2016-09-22 LAB — URIC ACID: URIC ACID, SERUM: 4.9 mg/dL (ref 4.0–8.0)

## 2016-09-22 LAB — MICROALBUMIN / CREATININE URINE RATIO
Creatinine, Urine: 118 mg/dL (ref 20–370)
MICROALB UR: 0.4 mg/dL
MICROALB/CREAT RATIO: 3 ug/mg{creat} (ref <30)

## 2016-09-22 LAB — MAGNESIUM: Magnesium: 1.6 mg/dL (ref 1.5–2.5)

## 2016-09-22 LAB — INSULIN, RANDOM: Insulin: 27.2 u[IU]/mL — ABNORMAL HIGH (ref 2.0–19.6)

## 2016-09-22 LAB — VITAMIN D 25 HYDROXY (VIT D DEFICIENCY, FRACTURES): Vit D, 25-Hydroxy: 54 ng/mL (ref 30–100)

## 2016-10-24 ENCOUNTER — Other Ambulatory Visit: Payer: Self-pay | Admitting: Internal Medicine

## 2016-10-24 ENCOUNTER — Other Ambulatory Visit: Payer: Self-pay | Admitting: *Deleted

## 2016-10-24 MED ORDER — ALPRAZOLAM 1 MG PO TABS: ORAL_TABLET | ORAL | 1 refills | Status: DC

## 2016-11-29 ENCOUNTER — Other Ambulatory Visit: Payer: Self-pay | Admitting: *Deleted

## 2016-11-29 MED ORDER — FENOFIBRATE MICRONIZED 134 MG PO CAPS: 134.0000 mg | ORAL_CAPSULE | Freq: Every day | ORAL | 1 refills | Status: DC

## 2016-12-13 ENCOUNTER — Other Ambulatory Visit: Payer: Self-pay | Admitting: *Deleted

## 2016-12-13 MED ORDER — FENOFIBRATE 160 MG PO TABS: 160.0000 mg | ORAL_TABLET | Freq: Every day | ORAL | 1 refills | Status: DC

## 2016-12-24 NOTE — Progress Notes (Signed)
MEDICARE ANNUAL WELLNESS VISIT AND FOLLOW UP Assessment:    Essential hypertension -well controlled currently -cont meds -dash diet -monitor at home - TSH  OSA (obstructive sleep apnea) -cont use of cpap machine -sleep hygiene -lose weight  Type II diabetes mellitus with nephropathy ([place]) -working hard on losing weight -cont diet and exercise -hopefully can decrease medications - Hemoglobin A1c  BPH/Prostatism -cont meds -currently without change from prior visit -followed by urology   Hyperlipidemia -cont meds -diet and exercise - Lipid panel  Testosterone deficiency -not currently on therapy - weight loss advised   Idiopathic gout, unspecified chronicity, unspecified site Gout- recheck Uric acid as needed, Diet discussed   Vitamin D deficiency -cont supplement   Medication management - CBC with Differential/Platelet - BASIC METABOLIC PANEL WITH GFR - Hepatic function panel   BMI 32.59,  adult -diet and exercise recommended   Medicare annual wellness visit, initial -due next year   Over 30 minutes of exam, counseling, chart review, and critical decision making was performed Future Appointments Date Time Provider [place]  04/06/2017 9:30 AM Unk Pinto, MD [facility] None  10/23/2017 10:00 AM Unk Pinto, MD [facility] None    Plan:   During the course of the visit the patient was educated and counseled about appropriate screening and preventive services including:    Pneumococcal vaccine   Influenza vaccine  Prevnar 13  Td vaccine  Screening electrocardiogram  Colorectal cancer screening  Diabetes screening  Glaucoma screening  Nutrition counseling    Subjective:  Jeffery Reed is a 71 y.o. male who presents for Medicare Annual Wellness Visit and 3 month follow up for HTN, hyperlipidemia, prediabetes, and vitamin D Def.   His blood pressure has been controlled at home, today their BP is BP: 110/82 He does  not workout. He denies chest pain, shortness of breath, dizziness.  He is on cholesterol medication and denies myalgias. His cholesterol is at goal. The cholesterol last visit was:   Lab Results  Component Value Date   CHOL 161 09/21/2016   HDL 25 (L) 09/21/2016   LDLCALC 92 09/21/2016   TRIG 219 (H) 09/21/2016   CHOLHDL 6.4 (H) 09/21/2016   He has been working on diet and exercise for diabetes with CKD, and denies foot ulcerations, hyperglycemia, hypoglycemia , increased appetite, nausea, paresthesia of the feet, polydipsia, polyuria, visual disturbances, vomiting and weight loss. Last A1C in the office was:  Lab Results  Component Value Date   HGBA1C 7.1 (H) 09/21/2016   Last GFR Lab Results  Component Value Date   GFRNONAA 48 (L) 09/21/2016    Patient is on Vitamin D supplement.   Lab Results  Component Value Date   VD25OH 54 09/21/2016     BMI is Body mass index is 33.32 kg/m., he is working on diet and exercise. Wt Readings from Last 3 Encounters:  12/26/16 232 lb 3.2 oz (105.3 kg)  09/21/16 227 lb (103 kg)  06/14/16 236 lb (107 kg)     Medication Review: Current Outpatient Prescriptions on File Prior to Visit  Medication Sig Dispense Refill  . ALPRAZolam (XANAX) 1 MG tablet TAKE ONE-HALF OR ONE TABLET BY MOUTH THREE TIMES A DAY AS NEEDED 270 tablet 1  . aspirin 81 MG tablet Take 81 mg by mouth daily.    . chlorpheniramine (CHLOR-TRIMETON) 4 MG tablet Take 4 mg by mouth 2 (two) times daily as needed for allergies.    . Cholecalciferol (VITAMIN D3) 10000 units capsule Take 10,000 Units  by mouth daily.    . fenofibrate 160 MG tablet Take 1 tablet (160 mg total) by mouth daily. 90 tablet 1  . fluticasone (FLONASE) 50 MCG/ACT nasal spray USE 1 TO 2 SPRAYS IN EACH  NOSTRIL DAILY. 48 g 1  . losartan-hydrochlorothiazide (HYZAAR) 100-25 MG tablet TAKE 1 TABLET BY MOUTH  DAILY 90 tablet 1  . metFORMIN (GLUCOPHAGE-XR) 500 MG 24 hr tablet TAKE 2 TABLETS BY MOUTH  TWICE DAILY ( 4  TABLETS  DAILY) 360 tablet 1  . mupirocin ointment (BACTROBAN) 2 % APPLY TWICE A DAY TO SKIN INFECTION. 66 g 1  . omeprazole (PRILOSEC) 40 MG capsule TAKE 1 CAPSULE BY MOUTH  DAILY. FOR ACID REFLUX 90 capsule 1  . oxybutynin (DITROPAN) 5 MG tablet TAKE 1 TABLET BY MOUTH 3  TIMES DAILY 270 tablet 1  . pravastatin (PRAVACHOL) 40 MG tablet TAKE 1 TABLET BY MOUTH AT BEDTIME FOR CHOLESTEROL 90 tablet 0  . tamsulosin (FLOMAX) 0.4 MG CAPS capsule Take 1 capsule (0.4 mg total) by mouth daily. 90 capsule 4   No current facility-administered medications on file prior to visit.     Current Problems (verified) Patient Active Problem List   Diagnosis Date Noted  . GERD (gastroesophageal reflux disease) 03/12/2016  . BMI 32.59,  adult 06/01/2015  . Medication management 08/19/2014  . BPH/Prostatism 08/19/2014  . Essential hypertension 08/17/2013  . Type 2 diabetes mellitus with stage 3 chronic kidney disease, without long-term current use of insulin ([place]) 08/17/2013  . Vitamin D deficiency 08/17/2013  . Hyperlipidemia   . Testosterone deficiency   . Gout   . OSA (obstructive sleep apnea)     Screening Tests Immunization History  Administered Date(s) Administered  . DT 06/01/2015  . Influenza Whole 06/05/2013  . Influenza, High Dose Seasonal PF 06/05/2014, 06/01/2015, 06/14/2016  . Pneumococcal Conjugate-13 08/19/2014  . Pneumococcal Polysaccharide-23 08/22/2003, 12/10/2015  . Tdap 03/08/2004  . Zoster 08/18/2013   Preventative care: Last colonoscopy: 2010 Ct head 2010 CXR 2010  Prior vaccinations: TD or Tdap: 2016  Influenza: 2017  Pneumococcal: 2017 Prevnar13: 2015 Shingles/Zostavax: 2014  Names of Other Physician/Practitioners you currently use: 1. [facility] here for primary care 2. Dr. Bridgett Larsson, eye doctor, last visit 08/2016 3. Dr. Yong Channel, dentist, last visit 2018 Patient Care Team: Unk Pinto, MD as PCP - General (Internal  Medicine) Franchot Gallo, MD as Consulting Physician (Urology)  Allergies Allergies  Allergen Reactions  . Cardura [Doxazosin Mesylate] Other (See Comments)    Nasal congestion  . Codeine Other (See Comments)    Pt passes out.  Marland Kitchen Hytrin [Terazosin]     SURGICAL HISTORY He  has a past surgical history that includes Foot surgery (10/2009); Tonsillectomy (1953); and Myringotomy (Left, 02-2005). FAMILY HISTORY His family history includes Diabetes in his maternal grandmother and paternal grandmother; GER disease in his mother; Hyperlipidemia in his father; Hypertension in his brother, father, and mother; Parkinson's disease in his father; Stroke in his paternal grandfather and paternal grandmother. SOCIAL HISTORY He  reports that he has never smoked. He has never used smokeless tobacco. He reports that he does not drink alcohol or use drugs.  MEDICARE WELLNESS OBJECTIVES: Physical activity: Current Exercise Habits: The patient does not participate in regular exercise at present (yard work) Cardiac risk factors: Cardiac Risk Factors include: advanced age (>67men, >106 women);diabetes mellitus;dyslipidemia;hypertension;obesity (BMI >30kg/m2);sedentary lifestyle;male gender Depression/mood screen:   Depression screen [facility] 2/9 12/26/2016  Decreased Interest 0  Down, Depressed, Hopeless 0  PHQ - 2 Score 0    ADLs:  In your present state of health, do you have any difficulty performing the following activities: 12/26/2016 09/21/2016  Hearing? N N  Vision? N N  Difficulty concentrating or making decisions? N N  Walking or climbing stairs? N N  Dressing or bathing? N N  Doing errands, shopping? N N  Some recent data might be hidden     Cognitive Testing  Alert? Yes  Normal Appearance?Yes  Oriented to person? Yes  Place? Yes   Time? Yes  Recall of three objects?  Yes  Can perform simple calculations? Yes  Displays appropriate judgment?Yes  Can read the correct time from a watch  face?Yes  EOL planning: Does Patient Have a Medical Advance Directive?: Yes Type of Advance Directive: Healthcare Power of Attorney, Living will Copy of [place] in Chart?: No - copy requested   Objective:   Today's Vitals   12/26/16 0931  BP: 110/82  Pulse: 74  Resp: 16  Temp: 97.5 F (36.4 C)  SpO2: 95%  Weight: 232 lb 3.2 oz (105.3 kg)  Height: 5\' 10"  (1.778 m)   Body mass index is 33.32 kg/m.  General appearance: alert, no distress, WD/WN, male HEENT: normocephalic, sclerae anicteric, TMs pearly, nares patent, no discharge or erythema, pharynx normal Oral cavity: MMM, no lesions Neck: supple, no lymphadenopathy, no thyromegaly, no masses Heart: RRR, normal S1, S2, no murmurs Lungs: CTA bilaterally, no wheezes, rhonchi, or rales Abdomen: +bs, soft, non tender, non distended, no masses, no hepatomegaly, no splenomegaly Musculoskeletal: nontender, no swelling, no obvious deformity Extremities: no edema, no cyanosis, no clubbing Pulses: 2+ symmetric, upper and lower extremities, normal cap refill Neurological: alert, oriented x 3, CN2-12 intact, strength normal upper extremities and lower extremities, sensation normal throughout, DTRs 2+ throughout, no cerebellar signs, gait normal Psychiatric: normal affect, behavior normal, pleasant   Medicare Attestation I have personally reviewed: The patient's medical and social history Their use of alcohol, tobacco or illicit drugs Their current medications and supplements The patient's functional ability including ADLs,fall risks, home safety risks, cognitive, and hearing and visual impairment Diet and physical activities Evidence for depression or mood disorders  The patient's weight, height, BMI, and visual acuity have been recorded in the chart.  I have made referrals, counseling, and provided education to the patient based on review of the above and I have provided the patient with a written personalized  care plan for preventive services.     Vicie Mutters, PA-C   12/26/2016

## 2016-12-26 ENCOUNTER — Ambulatory Visit (INDEPENDENT_AMBULATORY_CARE_PROVIDER_SITE_OTHER): Payer: Medicare Other | Admitting: Physician Assistant

## 2016-12-26 ENCOUNTER — Encounter: Payer: Self-pay | Admitting: Physician Assistant

## 2016-12-26 VITALS — BP 110/82 | HR 74 | Temp 97.5°F | Resp 16 | Ht 70.0 in | Wt 232.2 lb

## 2016-12-26 DIAGNOSIS — R6889 Other general symptoms and signs: Secondary | ICD-10-CM | POA: Diagnosis not present

## 2016-12-26 DIAGNOSIS — Z6832 Body mass index (BMI) 32.0-32.9, adult: Secondary | ICD-10-CM | POA: Diagnosis not present

## 2016-12-26 DIAGNOSIS — E349 Endocrine disorder, unspecified: Secondary | ICD-10-CM

## 2016-12-26 DIAGNOSIS — M1000 Idiopathic gout, unspecified site: Secondary | ICD-10-CM | POA: Diagnosis not present

## 2016-12-26 DIAGNOSIS — E559 Vitamin D deficiency, unspecified: Secondary | ICD-10-CM

## 2016-12-26 DIAGNOSIS — K219 Gastro-esophageal reflux disease without esophagitis: Secondary | ICD-10-CM | POA: Diagnosis not present

## 2016-12-26 DIAGNOSIS — Z0001 Encounter for general adult medical examination with abnormal findings: Secondary | ICD-10-CM

## 2016-12-26 DIAGNOSIS — G4733 Obstructive sleep apnea (adult) (pediatric): Secondary | ICD-10-CM

## 2016-12-26 DIAGNOSIS — N401 Enlarged prostate with lower urinary tract symptoms: Secondary | ICD-10-CM | POA: Diagnosis not present

## 2016-12-26 DIAGNOSIS — E1122 Type 2 diabetes mellitus with diabetic chronic kidney disease: Secondary | ICD-10-CM

## 2016-12-26 DIAGNOSIS — E782 Mixed hyperlipidemia: Secondary | ICD-10-CM | POA: Diagnosis not present

## 2016-12-26 DIAGNOSIS — N183 Chronic kidney disease, stage 3 unspecified: Secondary | ICD-10-CM

## 2016-12-26 DIAGNOSIS — I10 Essential (primary) hypertension: Secondary | ICD-10-CM | POA: Diagnosis not present

## 2016-12-26 DIAGNOSIS — Z79899 Other long term (current) drug therapy: Secondary | ICD-10-CM | POA: Diagnosis not present

## 2016-12-26 DIAGNOSIS — N138 Other obstructive and reflux uropathy: Secondary | ICD-10-CM | POA: Diagnosis not present

## 2016-12-26 DIAGNOSIS — Z0000 Encounter for general adult medical examination without abnormal findings: Secondary | ICD-10-CM

## 2016-12-26 LAB — CBC WITH DIFFERENTIAL/PLATELET
BASOS PCT: 1 %
Basophils Absolute: 83 cells/uL (ref 0–200)
EOS ABS: 332 {cells}/uL (ref 15–500)
Eosinophils Relative: 4 %
HCT: 45.1 % (ref 38.5–50.0)
Hemoglobin: 14.9 g/dL (ref 13.2–17.1)
LYMPHS PCT: 25 %
Lymphs Abs: 2075 cells/uL (ref 850–3900)
MCH: 28.9 pg (ref 27.0–33.0)
MCHC: 33 g/dL (ref 32.0–36.0)
MCV: 87.4 fL (ref 80.0–100.0)
MONOS PCT: 10 %
MPV: 10.7 fL (ref 7.5–12.5)
Monocytes Absolute: 830 cells/uL (ref 200–950)
Neutro Abs: 4980 cells/uL (ref 1500–7800)
Neutrophils Relative %: 60 %
PLATELETS: 174 10*3/uL (ref 140–400)
RBC: 5.16 MIL/uL (ref 4.20–5.80)
RDW: 14.8 % (ref 11.0–15.0)
WBC: 8.3 10*3/uL (ref 3.8–10.8)

## 2016-12-26 LAB — BASIC METABOLIC PANEL WITH GFR
BUN: 17 mg/dL (ref 7–25)
CO2: 24 mmol/L (ref 20–31)
CREATININE: 1.21 mg/dL (ref 0.70–1.25)
Calcium: 9.7 mg/dL (ref 8.6–10.3)
Chloride: 104 mmol/L (ref 98–110)
GFR, EST AFRICAN AMERICAN: 70 mL/min (ref >=60)
GFR, Est Non African American: 61 mL/min (ref >=60)
Glucose, Bld: 262 mg/dL — ABNORMAL HIGH (ref 65–99)
Potassium: 4.2 mmol/L (ref 3.5–5.3)
Sodium: 142 mmol/L (ref 135–146)

## 2016-12-26 LAB — HEPATIC FUNCTION PANEL
ALT: 17 U/L (ref 9–46)
AST: 17 U/L (ref 10–35)
Albumin: 4.5 g/dL (ref 3.6–5.1)
Alkaline Phosphatase: 69 U/L (ref 40–115)
BILIRUBIN INDIRECT: 0.6 mg/dL (ref 0.2–1.2)
Bilirubin, Direct: 0.1 mg/dL (ref <=0.2)
TOTAL PROTEIN: 7 g/dL (ref 6.1–8.1)
Total Bilirubin: 0.7 mg/dL (ref 0.2–1.2)

## 2016-12-26 LAB — LIPID PANEL
Cholesterol: 159 mg/dL (ref <200)
HDL: 26 mg/dL — ABNORMAL LOW (ref >40)
LDL CALC: 72 mg/dL (ref <100)
Total CHOL/HDL Ratio: 6.1 Ratio — ABNORMAL HIGH (ref <5.0)
Triglycerides: 305 mg/dL — ABNORMAL HIGH (ref <150)
VLDL: 61 mg/dL — ABNORMAL HIGH (ref <30)

## 2016-12-26 LAB — TSH: TSH: 1.78 m[IU]/L (ref 0.40–4.50)

## 2016-12-26 NOTE — Patient Instructions (Addendum)
Diabetes is a very complicated disease...lets simplify it.  An easy way to look at it to understand the complications is if you think of the extra sugar floating in your blood stream as glass shards floating through your blood stream.    Diabetes affects your small vessels first: 1) The glass shards (sugar) scraps down the tiny blood vessels in your eyes and lead to diabetic retinopathy, the leading cause of blindness in the [place]. Diabetes is the leading cause of newly diagnosed adult (53 to 70 years of age) blindness in the [place].  2) The glass shards scratches down the tiny vessels of your legs leading to nerve damage called neuropathy and can lead to amputations of your feet. More than 60% of all non-traumatic amputations of lower limbs occur in people with diabetes.  3) Over time the small vessels in your brain are shredded and closed off, individually this does not cause any problems but over a long period of time many of the small vessels being blocked can lead to Vascular Dementia.   4) Your kidney's are a filter system and have a "net" that keeps certain things in the body and lets bad things out. Sugar shreds this net and leads to kidney damage and eventually failure. Decreasing the sugar that is destroying the net and certain blood pressure medications can help stop or decrease progression of kidney disease. Diabetes was the primary cause of kidney failure in 44 percent of all new cases in 2011.  5) Diabetes also destroys the small vessels in your penis that lead to erectile dysfunction. Eventually the vessels are so damaged that you may not be responsive to cialis or viagra.   Diabetes and your large vessels: Your larger vessels consist of your coronary arteries in your heart and the carotid vessels to your brain. Diabetes or even increased sugars put you at 300% increased risk of heart attack and stroke and this is why.. The sugar scrapes down your large blood vessels and your body  sees this as an internal injury and tries to repair itself. Just like you get a scab on your skin, your platelets will stick to the blood vessel wall trying to heal it. This is why we have diabetics on low dose aspirin daily, this prevents the platelets from sticking and can prevent plaque formation. In addition, your body takes cholesterol and tries to shove it into the open wound. This is why we want your LDL, or bad cholesterol, below 70.   The combination of platelets and cholesterol over 5-10 years forms plaque that can break off and cause a heart attack or stroke.   PLEASE REMEMBER:  Diabetes is preventable! Up to 62 percent of complications and morbidities among individuals with type 2 diabetes can be prevented, delayed, or effectively treated and minimized with regular visits to a health professional, appropriate monitoring and medication, and a healthy diet and lifestyle.     Bad carbs also include fruit juice, alcohol, and sweet tea. These are empty calories that do not signal to your brain that you are full.   Please remember the good carbs are still carbs which convert into sugar. So please measure them out no more than 1/2-1 cup of rice, oatmeal, pasta, and beans  Veggies are however free foods! Pile them on.   Not all fruit is created equal. Please see the list below, the fruit at the bottom is higher in sugars than the fruit at the top. Please avoid all dried fruits.  Your A1C is a measure of your sugar over the past 3 months and is not affected by what you have eaten over the past few days. Diabetes increases your chances of stroke and heart attack over 300 % and is the leading cause of blindness and kidney failure in the [place]. Please make sure you decrease bad carbs like white bread, white rice, potatoes, corn, soft drinks, pasta, cereals, refined sugars, sweet tea, dried fruits, and fruit juice. Good carbs are okay to eat in moderation like sweet potatoes, brown rice,  whole grain pasta/bread, most fruit (except dried fruit) and you can eat as many veggies as you want.   Greater than 6.5 is considered diabetic. Between 6.4 and 5.7 is prediabetic If your A1C is less than 5.7 you are NOT diabetic.  Targets for Glucose Readings: Time of Check Target for patients WITHOUT Diabetes Target for DIABETICS  Before Meals Less than 100  less than 150  Two hours after meals Less than 200  Less than 250    Please pick one of the over the counter allergy medications below and take it once daily for allergies.  Claritin or loratadine cheapest but likely the weakest  Zyrtec or certizine at night because it can make you sleepy The strongest is allegra or fexafinadine  Cheapest at [facility], [employer], [employer]  HOW TO TREAT VIRAL COUGH AND COLD SYMPTOMS:  -Symptoms usually last at least 1 week with the worst symptoms being around day 4.  - colds usually start with a sore throat and end with a cough, and the cough can take 2 weeks to get better.  -No antibiotics are needed for colds, flu, sore throats, cough, bronchitis UNLESS symptoms are longer than 7 days OR if you are getting better then get drastically worse.  -There are a lot of combination medications (Dayquil, Nyquil, Vicks 44, tyelnol cold and sinus, ETC). Please look at the ingredients on the back so that you are treating the correct symptoms and not doubling up on medications/ingredients.    Medicines you can use  Nasal congestion  - pseudoephedrine (Sudafed)- behind the counter, do not use if you have high blood pressure, medicine that have -D in them.  - phenylephrine (Sudafed PE) -Dextormethorphan + chlorpheniramine (Coridcidin HBP)- okay if you have high blood pressure -Oxymetazoline (Afrin) nasal spray- LIMIT to 3 days -Saline nasal spray -Neti pot (used distilled or bottled water)  Ear pain/congestion  -pseudoephedrine (sudafed) - Nasonex/flonase nasal spray  Fever  -Acetaminophen  (Tyelnol) -Ibuprofen (Advil, motrin, aleve)  Sore Throat  -Acetaminophen (Tyelnol) -Ibuprofen (Advil, motrin, aleve) -Drink a lot of water -Gargle with salt water - Rest your voice (don't talk) -Throat sprays -Cough drops  Body Aches  -Acetaminophen (Tyelnol) -Ibuprofen (Advil, motrin, aleve)  Headache  -Acetaminophen (Tyelnol) -Ibuprofen (Advil, motrin, aleve) - Exedrin, Exedrin Migraine  Allergy symptoms (cough, sneeze, runny nose, itchy eyes) -Claritin or loratadine cheapest but likely the weakest  -Zyrtec or certizine at night because it can make you sleepy -The strongest is allegra or fexafinadine  Cheapest at [employer], [employer], [employer]  Cough  -Dextromethorphan (Delsym)- medicine that has DM in it -Guafenesin (Mucinex/Robitussin) - cough drops - drink lots of water  Chest Congestion  -Guafenesin (Mucinex/Robitussin)  Red Itchy Eyes  - Naphcon-A  Upset Stomach  - Bland diet (nothing spicy, greasy, fried, and high acid foods like tomatoes, oranges, berries) -OKAY- cereal, bread, soup, crackers, rice -Eat smaller more frequent meals -reduce caffeine, no alcohol -Loperamide (Imodium-AD) if diarrhea -Prevacid for  heart burn  General health when sick  -Hydration -wash your hands frequently -keep surfaces clean -change pillow cases and sheets often -Get fresh air but do not exercise strenuously -Vitamin D, double up on it - Vitamin C -Zinc

## 2016-12-27 LAB — MAGNESIUM: Magnesium: 1.6 mg/dL (ref 1.5–2.5)

## 2016-12-27 LAB — HEMOGLOBIN A1C
HEMOGLOBIN A1C: 7.6 % — AB (ref <5.7)
MEAN PLASMA GLUCOSE: 171 mg/dL

## 2016-12-27 NOTE — Progress Notes (Signed)
Pt aware of lab results & voiced understanding of those results.

## 2017-02-05 ENCOUNTER — Other Ambulatory Visit: Payer: Self-pay | Admitting: Internal Medicine

## 2017-02-15 DIAGNOSIS — J018 Other acute sinusitis: Secondary | ICD-10-CM | POA: Diagnosis not present

## 2017-02-15 DIAGNOSIS — H6123 Impacted cerumen, bilateral: Secondary | ICD-10-CM | POA: Diagnosis not present

## 2017-03-13 ENCOUNTER — Other Ambulatory Visit: Payer: Self-pay | Admitting: Internal Medicine

## 2017-04-06 ENCOUNTER — Encounter: Payer: Self-pay | Admitting: Internal Medicine

## 2017-04-06 ENCOUNTER — Ambulatory Visit (INDEPENDENT_AMBULATORY_CARE_PROVIDER_SITE_OTHER): Payer: Medicare Other | Admitting: Internal Medicine

## 2017-04-06 VITALS — BP 124/82 | HR 72 | Temp 97.3°F | Resp 18 | Ht 70.0 in | Wt 225.6 lb

## 2017-04-06 DIAGNOSIS — E559 Vitamin D deficiency, unspecified: Secondary | ICD-10-CM | POA: Diagnosis not present

## 2017-04-06 DIAGNOSIS — K219 Gastro-esophageal reflux disease without esophagitis: Secondary | ICD-10-CM

## 2017-04-06 DIAGNOSIS — I10 Essential (primary) hypertension: Secondary | ICD-10-CM | POA: Diagnosis not present

## 2017-04-06 DIAGNOSIS — Z79899 Other long term (current) drug therapy: Secondary | ICD-10-CM | POA: Diagnosis not present

## 2017-04-06 DIAGNOSIS — E782 Mixed hyperlipidemia: Secondary | ICD-10-CM | POA: Diagnosis not present

## 2017-04-06 DIAGNOSIS — N183 Chronic kidney disease, stage 3 (moderate): Secondary | ICD-10-CM | POA: Diagnosis not present

## 2017-04-06 DIAGNOSIS — E1122 Type 2 diabetes mellitus with diabetic chronic kidney disease: Secondary | ICD-10-CM | POA: Diagnosis not present

## 2017-04-06 NOTE — Patient Instructions (Signed)

## 2017-04-06 NOTE — Progress Notes (Signed)
This very nice 71 y.o. MWM presents for 6 month follow up with Hypertension, Hyperlipidemia, Pre-Diabetes and Vitamin D Deficiency. Patient also is on CPAP for OSA with improved restorative sleep and less daytime hypersomnolence.     Patient is treated for HTN (1997)  & BP has been controlled at home. Today's BP is at goal - 124/82. Patient has had no complaints of any cardiac type chest pain, palpitations, dyspnea/orthopnea/PND, dizziness, claudication, or dependent edema.     Hyperlipidemia is controlled with diet & meds. Patient denies myalgias or other med SE's. Last Lipids were at goal albeit elevated Trig's: Lab Results  Component Value Date   CHOL 159 12/26/2016   HDL 26 (L) 12/26/2016   LDLCALC 72 12/26/2016   TRIG 305 (H) 12/26/2016   CHOLHDL 6.1 (H) 12/26/2016      Also, the patient has history of PreDm predating from 2008 (A1c 6.9%), til 2014 he was dx'd with T2_NIDDM and has had no symptoms of reactive hypoglycemia, diabetic polys, paresthesias or visual blurring.  Dietary compliance has historically been poor and last  A1c was not at goal:  Lab Results  Component Value Date   HGBA1C 7.6 (H) 12/26/2016      Further, the patient also has history of Vitamin D Deficiency ("22" in 2008)  and supplements vitamin D without any suspected side-effects. Last vitamin D was still relatively low (goal 70-100):   Lab Results  Component Value Date   VD25OH 54 09/21/2016   Current Outpatient Prescriptions on File Prior to Visit  Medication Sig  . ALPRAZolam (XANAX) 1 MG tablet TAKE ONE-HALF OR ONE TABLET BY MOUTH THREE TIMES A DAY AS NEEDED  . aspirin 81 MG tablet Take 81 mg by mouth daily.  . chlorpheniramine (CHLOR-TRIMETON) 4 MG tablet Take 4 mg by mouth 2 (two) times daily as needed for allergies.  . Cholecalciferol (VITAMIN D3) 10000 units capsule Take 10,000 Units by mouth daily.  . fenofibrate 160 MG tablet TAKE 1 TABLET BY MOUTH  DAILY  . fluticasone (FLONASE) 50 MCG/ACT  nasal spray USE 1 TO 2 SPRAYS IN EACH  NOSTRIL DAILY.  Marland Kitchen losartan-hydrochlorothiazide (HYZAAR) 100-25 MG tablet TAKE 1 TABLET BY MOUTH  DAILY  . metFORMIN (GLUCOPHAGE-XR) 500 MG 24 hr tablet TAKE 2 TABLETS BY MOUTH  TWICE DAILY ( 4 TABLETS  DAILY)  . mupirocin ointment (BACTROBAN) 2 % APPLY TWICE A DAY TO SKIN INFECTION.  Marland Kitchen omeprazole (PRILOSEC) 40 MG capsule TAKE 1 CAPSULE BY MOUTH  DAILY. FOR ACID REFLUX  . oxybutynin (DITROPAN) 5 MG tablet TAKE 1 TABLET BY MOUTH 3  TIMES DAILY  . pravastatin (PRAVACHOL) 40 MG tablet TAKE 1 TABLET BY MOUTH AT  BEDTIME FOR CHOLESTEROL  . tamsulosin (FLOMAX) 0.4 MG CAPS capsule Take 1 capsule (0.4 mg total) by mouth daily.   No current facility-administered medications on file prior to visit.    Allergies  Allergen Reactions  . Cardura [Doxazosin Mesylate] Other (See Comments)    Nasal congestion  . Codeine Other (See Comments)    Pt passes out.  Marland Kitchen Hytrin [Terazosin]    PMHx:   Past Medical History:  Diagnosis Date  . Gout   . Hyperlipidemia   . Hypertension   . OSA (obstructive sleep apnea)   . Pre-diabetes    Immunization History  Administered Date(s) Administered  . DT 06/01/2015  . Influenza Whole 06/05/2013  . Influenza, High Dose Seasonal PF 06/05/2014, 06/01/2015, 06/14/2016  . Pneumococcal Conjugate-13 08/19/2014  .  Pneumococcal Polysaccharide-23 08/22/2003, 12/10/2015  . Tdap 03/08/2004  . Zoster 08/18/2013   Past Surgical History:  Procedure Laterality Date  . FOOT SURGERY  10/2009  . MYRINGOTOMY Left 02-2005   Dr.Newman  . TONSILLECTOMY  1953   FHx:    Reviewed / unchanged  SHx:    Reviewed / unchanged  Systems Review:  Constitutional: Denies fever, chills, wt changes, headaches, insomnia, fatigue, night sweats, change in appetite. Eyes: Denies redness, blurred vision, diplopia, discharge, itchy, watery eyes.  ENT: Denies discharge, congestion, post nasal drip, epistaxis, sore throat, earache, hearing loss, dental pain,  tinnitus, vertigo, sinus pain, snoring.  CV: Denies chest pain, palpitations, irregular heartbeat, syncope, dyspnea, diaphoresis, orthopnea, PND, claudication or edema. Respiratory: denies cough, dyspnea, DOE, pleurisy, hoarseness, laryngitis, wheezing.  Gastrointestinal: Denies dysphagia, odynophagia, heartburn, reflux, water brash, abdominal pain or cramps, nausea, vomiting, bloating, diarrhea, constipation, hematemesis, melena, hematochezia  or hemorrhoids. Genitourinary: Denies dysuria, frequency, urgency, nocturia, hesitancy, discharge, hematuria or flank pain. Musculoskeletal: Denies arthralgias, myalgias, stiffness, jt. swelling, pain, limping or strain/sprain.  Skin: Denies pruritus, rash, hives, warts, acne, eczema or change in skin lesion(s). Neuro: No weakness, tremor, incoordination, spasms, paresthesia or pain. Psychiatric: Denies confusion, memory loss or sensory loss. Endo: Denies change in weight, skin or hair change.  Heme/Lymph: No excessive bleeding, bruising or enlarged lymph nodes.  Physical Exam  BP 124/82   Pulse 72   Temp (!) 97.3 F (36.3 C)   Resp 18   Ht 5\' 10"  (1.778 m)   Wt 225 lb 9.6 oz (102.3 kg)   BMI 32.37 kg/m   Appears well nourished, well groomed  and in no distress.  Eyes: PERRLA, EOMs, conjunctiva no swelling or erythema. Sinuses: No frontal/maxillary tenderness ENT/Mouth: EAC's clear, TM's nl w/o erythema, bulging. Nares clear w/o erythema, swelling, exudates. Oropharynx clear without erythema or exudates. Oral hygiene is good. Tongue normal, non obstructing. Hearing intact.  Neck: Supple. Thyroid nl. Car 2+/2+ without bruits, nodes or JVD. Chest: Respirations nl with BS clear & equal w/o rales, rhonchi, wheezing or stridor.  Cor: Heart sounds normal w/ regular rate and rhythm without sig. murmurs, gallops, clicks or rubs. Peripheral pulses normal and equal  without edema.  Abdomen: Soft & bowel sounds normal. Non-tender w/o guarding, rebound,  hernias, masses or organomegaly.  Lymphatics: Unremarkable.  Musculoskeletal: Full ROM all peripheral extremities, joint stability, 5/5 strength and normal gait.  Skin: Warm, dry without exposed rashes, lesions or ecchymosis apparent.  Neuro: Cranial nerves intact, reflexes equal bilaterally. Sensory-motor testing grossly intact. Tendon reflexes grossly intact.  Pysch: Alert & oriented x 3.  Insight and judgement nl & appropriate. No ideations.  Assessment and Plan:   1. Essential hypertension  - Continue medication, monitor blood pressure at home.  - Continue DASH diet. Reminder to go to the ER if any CP,  SOB, nausea, dizziness, severe HA, changes vision/speech.  - BASIC METABOLIC PANEL WITH GFR - CBC with Differential/Platelet - Magnesium - TSH  2. Hyperlipidemia, mixed  - Continue diet, exercise, lifestyle modifications.   - Hepatic function panel - Lipid panel - TSH  3. Type 2 diabetes mellitus with stage 3 chronic kidney disease, without long-term current use of insulin (HCC)   - Monitor appropriate labs.   - Hemoglobin A1c - Insulin, random  4. Vitamin D deficiency  - VITAMIN D 25 Hydroxy   - Continue supplementation.  5. Gastroesophageal reflux disease   6. Medication management  - BASIC METABOLIC PANEL WITH GFR - CBC with  Differential/Platelet - Hepatic function panel - Magnesium - Lipid panel - TSH - Hemoglobin A1c - Insulin, random - VITAMIN D 25 Hydroxyl       Discussed  regular exercise, BP monitoring, weight control to achieve/maintain BMI less than 25 and discussed med and SE's. Recommended labs to assess and monitor clinical status with further disposition pending results of labs. Over 30 minutes of exam, counseling, chart review was performed.

## 2017-04-07 LAB — BASIC METABOLIC PANEL WITH GFR
BUN: 16 mg/dL (ref 7–25)
CALCIUM: 9.7 mg/dL (ref 8.6–10.3)
CO2: 28 mmol/L (ref 20–32)
Chloride: 102 mmol/L (ref 98–110)
Creat: 1.16 mg/dL (ref 0.70–1.25)
GFR, EST AFRICAN AMERICAN: 74 mL/min/{1.73_m2} (ref >=60)
GFR, EST NON AFRICAN AMERICAN: 64 mL/min/{1.73_m2} (ref >=60)
Glucose, Bld: 185 mg/dL — ABNORMAL HIGH (ref 65–99)
POTASSIUM: 4.4 mmol/L (ref 3.5–5.3)
Sodium: 140 mmol/L (ref 135–146)

## 2017-04-07 LAB — CBC WITH DIFFERENTIAL/PLATELET
BASOS ABS: 52 {cells}/uL (ref 0–200)
Basophils Relative: 0.7 %
EOS ABS: 311 {cells}/uL (ref 15–500)
Eosinophils Relative: 4.2 %
HEMATOCRIT: 41.1 % (ref 38.5–50.0)
HEMOGLOBIN: 14.2 g/dL (ref 13.2–17.1)
Lymphs Abs: 1954 cells/uL (ref 850–3900)
MCH: 29.6 pg (ref 27.0–33.0)
MCHC: 34.5 g/dL (ref 32.0–36.0)
MCV: 85.6 fL (ref 80.0–100.0)
MONOS PCT: 9 %
MPV: 10.6 fL (ref 7.5–12.5)
NEUTROS ABS: 4418 {cells}/uL (ref 1500–7800)
Neutrophils Relative %: 59.7 %
PLATELETS: 190 10*3/uL (ref 140–400)
RBC: 4.8 10*6/uL (ref 4.20–5.80)
RDW: 13.6 % (ref 11.0–15.0)
Total Lymphocyte: 26.4 %
WBC mixed population: 666 cells/uL (ref 200–950)
WBC: 7.4 10*3/uL (ref 3.8–10.8)

## 2017-04-07 LAB — HEMOGLOBIN A1C
EAG (MMOL/L): 12.4 (calc)
HEMOGLOBIN A1C: 9.4 %{Hb} — AB (ref <5.7)
MEAN PLASMA GLUCOSE: 223 (calc)

## 2017-04-07 LAB — HEPATIC FUNCTION PANEL
AG Ratio: 2.1 (calc) (ref 1.0–2.5)
ALKALINE PHOSPHATASE (APISO): 59 U/L (ref 40–115)
ALT: 20 U/L (ref 9–46)
AST: 22 U/L (ref 10–35)
Albumin: 4.5 g/dL (ref 3.6–5.1)
BILIRUBIN DIRECT: 0.1 mg/dL (ref 0.0–0.2)
BILIRUBIN INDIRECT: 0.5 mg/dL (ref 0.2–1.2)
BILIRUBIN TOTAL: 0.6 mg/dL (ref 0.2–1.2)
Globulin: 2.1 g/dL (calc) (ref 1.9–3.7)
Total Protein: 6.6 g/dL (ref 6.1–8.1)

## 2017-04-07 LAB — LIPID PANEL
Cholesterol: 178 mg/dL (ref <200)
HDL: 29 mg/dL — ABNORMAL LOW (ref >40)
LDL Cholesterol (Calc): 110 mg/dL (calc) — ABNORMAL HIGH
NON-HDL CHOLESTEROL (CALC): 149 mg/dL — AB (ref <130)
Total CHOL/HDL Ratio: 6.1 (calc) — ABNORMAL HIGH (ref <5.0)
Triglycerides: 274 mg/dL — ABNORMAL HIGH (ref <150)

## 2017-04-07 LAB — MAGNESIUM: MAGNESIUM: 1.6 mg/dL (ref 1.5–2.5)

## 2017-04-07 LAB — VITAMIN D 25 HYDROXY (VIT D DEFICIENCY, FRACTURES): VIT D 25 HYDROXY: 52 ng/mL (ref 30–100)

## 2017-04-07 LAB — TSH: TSH: 2.33 m[IU]/L (ref 0.40–4.50)

## 2017-04-09 LAB — INSULIN, FASTING: Insulin: 11.6 u[IU]/mL (ref 2.0–19.6)

## 2017-06-08 DIAGNOSIS — N3281 Overactive bladder: Secondary | ICD-10-CM | POA: Diagnosis not present

## 2017-06-08 DIAGNOSIS — N401 Enlarged prostate with lower urinary tract symptoms: Secondary | ICD-10-CM | POA: Diagnosis not present

## 2017-06-19 DIAGNOSIS — H1132 Conjunctival hemorrhage, left eye: Secondary | ICD-10-CM | POA: Diagnosis not present

## 2017-06-22 ENCOUNTER — Ambulatory Visit (INDEPENDENT_AMBULATORY_CARE_PROVIDER_SITE_OTHER): Payer: Medicare Other

## 2017-06-22 DIAGNOSIS — Z23 Encounter for immunization: Secondary | ICD-10-CM | POA: Diagnosis not present

## 2017-06-22 NOTE — Progress Notes (Signed)
Patient presents to the office for annual flu vaccine. Vaccination administered in left deltoid without any complications.

## 2017-07-10 ENCOUNTER — Telehealth: Payer: Self-pay | Admitting: *Deleted

## 2017-07-10 MED ORDER — AZITHROMYCIN 250 MG PO TABS: ORAL_TABLET | ORAL | 0 refills | Status: AC

## 2017-07-10 NOTE — Telephone Encounter (Signed)
Patient called and reported he is having sinus drainage with yellow mucus, ear pressure x 1 week.  Per Dr Melford Aase, an [place] for a Z-pak was sent to his pharmacy and his spouse was advised he can take OTC Delsym, if he develops a cough.

## 2017-07-16 NOTE — Progress Notes (Signed)
FOLLOW UP  Assessment and Plan:   Hypertension Well controlled with current medications  Monitor blood pressure at home; patient to call if consistently greater than 130/80 Continue DASH diet.   Reminder to go to the ER if any CP, SOB, nausea, dizziness, severe HA, changes vision/speech, left arm numbness and tingling and jaw pain.  Cholesterol Continue medications Continue low cholesterol diet and exercise.  Check lipid panel.   Diabetes with diabetic chronic kidney disease Continue metformin as prescribed; start low dose glipizide (2.5 mg BID) - patient to start checking fasting blood sugars daily, bring log at all following visits - may follow up in 4 weeks pending A1C results Continue diet and exercise.  Perform daily foot/skin check, notify office of any concerning changes.  Check A1C  Vitamin D Def/ osteoporosis prevention Continue supplementation Check Vit D level  GERD Symptoms well managed without breakthrough Will try to get off PPI given info for taper and zantac sent in  Gout Well controlled without recent flare not currently on allopurinol Diet discussed Check uric acid as needed  Continue diet and meds as discussed. Further disposition pending results of labs. Discussed med's effects and SE's.   Over 30 minutes of exam, counseling, chart review, and critical decision making was performed.   Future Appointments  Date Time Provider [place]  07/17/2017  9:30 AM Liane Comber, NP [facility] None  10/23/2017 10:00 AM Unk Pinto, MD [facility] None    ----------------------------------------------------------------------------------------------------------------------  HPI 71 y.o. male  presents for 3 month follow up on hypertension, cholesterol, diabetes, GERD, gout, obesity and vitamin D deficiency. He is currently prescribed metformin XR 500 mg 2 tabs bid. He reports he does check his blood sugar every few days- fasting blood sugars have been  running 200+ with a single occasion of blood sugar above 400.  He has cut out sweet drinks, but admits to eating pastries regularly.   BMI is Body mass index is 32 kg/m., he has not been working on diet and exercise. Wt Readings from Last 3 Encounters:  07/17/17 223 lb (101.2 kg)  04/06/17 225 lb 9.6 oz (102.3 kg)  12/26/16 232 lb 3.2 oz (105.3 kg)   he has a diagnosis of GERD which is currently managed by omeprazole 40 mg daily he reports symptoms is currently well controlled, and denies breakthrough reflux, burning in chest, hoarseness or cough.    His blood pressure has been controlled at home, today their BP is BP: 124/86  He does not workout. He denies chest pain, shortness of breath, dizziness.   He is on cholesterol medication and denies myalgias. His cholesterol is not at goal. The cholesterol last visit was:   Lab Results  Component Value Date   CHOL 178 04/06/2017   HDL 29 (L) 04/06/2017   LDLCALC 72 12/26/2016   TRIG 274 (H) 04/06/2017   CHOLHDL 6.1 (H) 04/06/2017    He has not been working on diet and exercise for diabetes, and denies increased appetite, nausea, paresthesia of the feet, polydipsia, polyuria and visual disturbances he endorses hyperglycemia. Last A1C in the office was:  Lab Results  Component Value Date   HGBA1C 9.4 (H) 04/06/2017   Patient is on high dose Vitamin D supplement but remains below goal:    Lab Results  Component Value Date   VD25OH 52 04/06/2017       Current Medications:  Current Outpatient Medications on File Prior to Visit  Medication Sig  . ALPRAZolam (XANAX) 1 MG tablet TAKE  ONE-HALF OR ONE TABLET BY MOUTH THREE TIMES A DAY AS NEEDED  . aspirin 81 MG tablet Take 81 mg by mouth daily.  . chlorpheniramine (CHLOR-TRIMETON) 4 MG tablet Take 4 mg by mouth 2 (two) times daily as needed for allergies.  . Cholecalciferol (VITAMIN D3) 10000 units capsule Take 10,000 Units by mouth daily.  . fenofibrate 160 MG tablet TAKE 1 TABLET BY  MOUTH  DAILY  . fluticasone (FLONASE) 50 MCG/ACT nasal spray USE 1 TO 2 SPRAYS IN EACH  NOSTRIL DAILY.  Marland Kitchen losartan-hydrochlorothiazide (HYZAAR) 100-25 MG tablet TAKE 1 TABLET BY MOUTH  DAILY  . metFORMIN (GLUCOPHAGE-XR) 500 MG 24 hr tablet TAKE 2 TABLETS BY MOUTH  TWICE DAILY ( 4 TABLETS  DAILY)  . mupirocin ointment (BACTROBAN) 2 % APPLY TWICE A DAY TO SKIN INFECTION.  Marland Kitchen omeprazole (PRILOSEC) 40 MG capsule TAKE 1 CAPSULE BY MOUTH  DAILY. FOR ACID REFLUX  . oxybutynin (DITROPAN) 5 MG tablet TAKE 1 TABLET BY MOUTH 3  TIMES DAILY  . pravastatin (PRAVACHOL) 40 MG tablet TAKE 1 TABLET BY MOUTH AT  BEDTIME FOR CHOLESTEROL  . tamsulosin (FLOMAX) 0.4 MG CAPS capsule Take 1 capsule (0.4 mg total) by mouth daily.   No current facility-administered medications on file prior to visit.      Allergies:  Allergies  Allergen Reactions  . Cardura [Doxazosin Mesylate] Other (See Comments)    Nasal congestion  . Codeine Other (See Comments)    Pt passes out.  Marland Kitchen Hytrin [Terazosin]      Medical History:  Past Medical History:  Diagnosis Date  . Gout   . Hyperlipidemia   . Hypertension   . OSA (obstructive sleep apnea)   . Pre-diabetes    Family history- Reviewed and unchanged Social history- Reviewed and unchanged   Review of Systems:  Review of Systems  Constitutional: Negative for malaise/fatigue and weight loss.  HENT: Negative for hearing loss and tinnitus.   Eyes: Negative for blurred vision and double vision.  Respiratory: Negative for cough, sputum production, shortness of breath and wheezing.   Cardiovascular: Negative for chest pain, palpitations, orthopnea, claudication and leg swelling.  Gastrointestinal: Negative for abdominal pain, blood in stool, constipation, diarrhea, heartburn, melena, nausea and vomiting.  Genitourinary: Negative.  Negative for frequency.  Musculoskeletal: Negative for falls, joint pain and myalgias.  Skin: Negative for rash.  Neurological: Negative for  dizziness, tingling, sensory change, weakness and headaches.  Endo/Heme/Allergies: Negative for polydipsia.  Psychiatric/Behavioral: Negative.   All other systems reviewed and are negative.     Physical Exam: BP 124/86   Pulse 74   Temp (!) 97.5 F (36.4 C)   Ht 5\' 10"  (1.778 m)   Wt 223 lb (101.2 kg)   SpO2 96%   BMI 32.00 kg/m  Wt Readings from Last 3 Encounters:  07/17/17 223 lb (101.2 kg)  04/06/17 225 lb 9.6 oz (102.3 kg)  12/26/16 232 lb 3.2 oz (105.3 kg)   General Appearance: Well nourished, in no apparent distress. Eyes: PERRLA, EOMs, conjunctiva no swelling or erythema Sinuses: No Frontal/maxillary tenderness ENT/Mouth: Ext aud canals clear, TMs without erythema, bulging. No erythema, swelling, or exudate on post pharynx.  Tonsils not swollen or erythematous. Hearing normal.  Neck: Supple, thyroid normal.  Respiratory: Respiratory effort normal, BS equal bilaterally without rales, rhonchi, wheezing or stridor.  Cardio: RRR with no MRGs. Brisk peripheral pulses without edema.  Abdomen: Soft, + BS.  Non tender, no guarding, rebound, hernias, masses. Lymphatics: Non tender without lymphadenopathy.  Musculoskeletal: Full ROM, 5/5 strength, Normal gait Skin: Warm, dry without rashes, lesions, ecchymosis.  Neuro: Cranial nerves intact. No cerebellar symptoms.  Psych: Awake and oriented X 3, normal affect, Insight and Judgment appropriate.    Izora Ribas, NP 9:26 AM [facility]

## 2017-07-17 ENCOUNTER — Encounter: Payer: Self-pay | Admitting: Adult Health

## 2017-07-17 ENCOUNTER — Ambulatory Visit (INDEPENDENT_AMBULATORY_CARE_PROVIDER_SITE_OTHER): Payer: Medicare Other | Admitting: Adult Health

## 2017-07-17 VITALS — BP 124/86 | HR 74 | Temp 97.5°F | Ht 70.0 in | Wt 223.0 lb

## 2017-07-17 DIAGNOSIS — Z79899 Other long term (current) drug therapy: Secondary | ICD-10-CM | POA: Diagnosis not present

## 2017-07-17 DIAGNOSIS — E669 Obesity, unspecified: Secondary | ICD-10-CM

## 2017-07-17 DIAGNOSIS — N183 Chronic kidney disease, stage 3 (moderate): Secondary | ICD-10-CM

## 2017-07-17 DIAGNOSIS — E559 Vitamin D deficiency, unspecified: Secondary | ICD-10-CM | POA: Diagnosis not present

## 2017-07-17 DIAGNOSIS — K219 Gastro-esophageal reflux disease without esophagitis: Secondary | ICD-10-CM | POA: Diagnosis not present

## 2017-07-17 DIAGNOSIS — I10 Essential (primary) hypertension: Secondary | ICD-10-CM

## 2017-07-17 DIAGNOSIS — E782 Mixed hyperlipidemia: Secondary | ICD-10-CM | POA: Diagnosis not present

## 2017-07-17 DIAGNOSIS — E1122 Type 2 diabetes mellitus with diabetic chronic kidney disease: Secondary | ICD-10-CM

## 2017-07-17 DIAGNOSIS — M1A00X0 Idiopathic chronic gout, unspecified site, without tophus (tophi): Secondary | ICD-10-CM

## 2017-07-17 LAB — GLUCOSE, POCT (MANUAL RESULT ENTRY): POC Glucose: 220 mg/dl — AB (ref 70–99)

## 2017-07-17 MED ORDER — RANITIDINE HCL 150 MG PO TABS: 150.0000 mg | ORAL_TABLET | Freq: Two times a day (BID) | ORAL | 1 refills | Status: DC

## 2017-07-17 MED ORDER — GLIPIZIDE 5 MG PO TABS: 2.5000 mg | ORAL_TABLET | Freq: Two times a day (BID) | ORAL | 5 refills | Status: DC

## 2017-07-17 NOTE — Patient Instructions (Addendum)
Try mucinex for chest congestion   Please make sure you continue to decrease bad carbs like white bread, white rice, potatoes, corn, soft drinks, pasta, cereals, refined sugars, sweet tea, dried fruits, and fruit juice. Good carbs are okay to eat in moderation like sweet potatoes, brown rice, whole grain pasta/bread, most fruit (except dried fruit) and you can eat as many veggies as you want.   Goals:   - BMI less than 24 (lose weight) - Fasting sugar less than 130 or less than 150 if tapering medicines to lose weight  - Systolic BP less than 007  - Diastolic BP less than 80 - Bad LDL Cholesterol less than 70 - Triglycerides less than 150      GETTING OFF OF PPI's    Nexium/protonix/prilosec/Omeprazole/Dexilant/Aciphex are called PPI's, they are great at healing your stomach but should only be taken for a short period of time.     Recent studies have shown that taken for a long time they  can increase the risk of osteoporosis (weakening of your bones), pneumonia, low magnesium, restless legs, Cdiff (infection that causes diarrhea), DEMENTIA and most recently kidney damage / disease / insufficiency.     Due to this information we want to try to stop the PPI but if you try to stop it abruptly this can cause rebound acid and worsening symptoms.   So this is how we want you to get off the PPI: Generic is always fine!!  - Start taking the nexium/protonix/prilosec/PPI  every other day with  zantac (ranitidine) OR pepcid famotadine 2 x a day for 2-4 weeks - some people stay on this dosage and can not taper off further. Our main goal is to limit the dosage and amount you are taking so if you need to stay on this dose.   - then decrease the PPI to every 3 days while taking the zantac or pepcid 300mg  twice a day the other  days for 2-4  Weeks  - then you can try the zantac or pepcid 300mg  once at night or up to 2 x day as needed.  - you can continue on this once at night or stop all  together  - Avoid alcohol, spicy foods, NSAIDS (aleve, ibuprofen) at this time. See foods below.   +++++++++++++++++++++++++++++++++++++++++++  Food Choices for Gastroesophageal Reflux Disease  When you have gastroesophageal reflux disease (GERD), the foods you eat and your eating habits are very important. Choosing the right foods can help ease the discomfort of GERD. WHAT GENERAL GUIDELINES DO I NEED TO FOLLOW?  Choose fruits, vegetables, whole grains, low-fat dairy products, and low-fat meat, fish, and poultry.  Limit fats such as oils, salad dressings, butter, nuts, and avocado.  Keep a food diary to identify foods that cause symptoms.  Avoid foods that cause reflux. These may be different for different people.  Eat frequent small meals instead of three large meals each day.  Eat your meals slowly, in a relaxed setting.  Limit fried foods.  Cook foods using methods other than frying.  Avoid drinking alcohol.  Avoid drinking large amounts of liquids with your meals.  Avoid bending over or lying down until 2-3 hours after eating.   WHAT FOODS ARE NOT RECOMMENDED? The following are some foods and drinks that may worsen your symptoms:  Vegetables Tomatoes. Tomato juice. Tomato and spaghetti sauce. Chili peppers. Onion and garlic. Horseradish. Fruits Oranges, grapefruit, and lemon (fruit and juice). Meats High-fat meats, fish, and poultry. This includes hot  dogs, ribs, ham, sausage, salami, and bacon. Dairy Whole milk and chocolate milk. Sour cream. Cream. Butter. Ice cream. Cream cheese.  Beverages Coffee and tea, with or without caffeine. Carbonated beverages or energy drinks. Condiments Hot sauce. Barbecue sauce.  Sweets/Desserts Chocolate and cocoa. Donuts. Peppermint and spearmint. Fats and Oils High-fat foods, including [place] fries and potato chips. Other Vinegar. Strong spices, such as black pepper, white pepper, red pepper, cayenne, curry powder, cloves,  ginger, and chili powder.

## 2017-07-18 LAB — HEMOGLOBIN A1C
EAG (MMOL/L): 12.2 (calc)
Hgb A1c MFr Bld: 9.3 % of total Hgb — ABNORMAL HIGH (ref <5.7)
MEAN PLASMA GLUCOSE: 220 (calc)

## 2017-07-18 LAB — HEPATIC FUNCTION PANEL
AG Ratio: 2 (calc) (ref 1.0–2.5)
ALKALINE PHOSPHATASE (APISO): 57 U/L (ref 40–115)
ALT: 16 U/L (ref 9–46)
AST: 19 U/L (ref 10–35)
Albumin: 4.7 g/dL (ref 3.6–5.1)
BILIRUBIN INDIRECT: 0.6 mg/dL (ref 0.2–1.2)
BILIRUBIN TOTAL: 0.7 mg/dL (ref 0.2–1.2)
Bilirubin, Direct: 0.1 mg/dL (ref 0.0–0.2)
Globulin: 2.4 g/dL (calc) (ref 1.9–3.7)
TOTAL PROTEIN: 7.1 g/dL (ref 6.1–8.1)

## 2017-07-18 LAB — LIPID PANEL
Cholesterol: 179 mg/dL (ref <200)
HDL: 26 mg/dL — AB (ref >40)
LDL Cholesterol (Calc): 117 mg/dL (calc) — ABNORMAL HIGH
NON-HDL CHOLESTEROL (CALC): 153 mg/dL — AB (ref <130)
Total CHOL/HDL Ratio: 6.9 (calc) — ABNORMAL HIGH (ref <5.0)
Triglycerides: 240 mg/dL — ABNORMAL HIGH (ref <150)

## 2017-07-18 LAB — CBC WITH DIFFERENTIAL/PLATELET
Basophils Absolute: 62 cells/uL (ref 0–200)
Basophils Relative: 0.7 %
EOS PCT: 3.8 %
Eosinophils Absolute: 338 cells/uL (ref 15–500)
HCT: 44.4 % (ref 38.5–50.0)
Hemoglobin: 15.4 g/dL (ref 13.2–17.1)
Lymphs Abs: 1994 cells/uL (ref 850–3900)
MCH: 29.5 pg (ref 27.0–33.0)
MCHC: 34.7 g/dL (ref 32.0–36.0)
MCV: 85.1 fL (ref 80.0–100.0)
MPV: 10.7 fL (ref 7.5–12.5)
Monocytes Relative: 8.7 %
NEUTROS PCT: 64.4 %
Neutro Abs: 5732 cells/uL (ref 1500–7800)
Platelets: 219 10*3/uL (ref 140–400)
RBC: 5.22 10*6/uL (ref 4.20–5.80)
RDW: 13.1 % (ref 11.0–15.0)
TOTAL LYMPHOCYTE: 22.4 %
WBC: 8.9 10*3/uL (ref 3.8–10.8)
WBCMIX: 774 {cells}/uL (ref 200–950)

## 2017-07-18 LAB — BASIC METABOLIC PANEL WITH GFR
BUN / CREAT RATIO: 15 (calc) (ref 6–22)
BUN: 20 mg/dL (ref 7–25)
CO2: 27 mmol/L (ref 20–32)
Calcium: 10 mg/dL (ref 8.6–10.3)
Chloride: 103 mmol/L (ref 98–110)
Creat: 1.33 mg/dL — ABNORMAL HIGH (ref 0.70–1.18)
GFR, EST NON AFRICAN AMERICAN: 54 mL/min/{1.73_m2} — AB (ref >=60)
GFR, Est African American: 62 mL/min/{1.73_m2} (ref >=60)
GLUCOSE: 252 mg/dL — AB (ref 65–99)
POTASSIUM: 4.4 mmol/L (ref 3.5–5.3)
SODIUM: 141 mmol/L (ref 135–146)

## 2017-07-18 LAB — TSH: TSH: 2.2 m[IU]/L (ref 0.40–4.50)

## 2017-07-18 LAB — MAGNESIUM: Magnesium: 1.6 mg/dL (ref 1.5–2.5)

## 2017-07-19 DIAGNOSIS — K4090 Unilateral inguinal hernia, without obstruction or gangrene, not specified as recurrent: Secondary | ICD-10-CM | POA: Diagnosis not present

## 2017-07-24 ENCOUNTER — Other Ambulatory Visit: Payer: Self-pay | Admitting: Internal Medicine

## 2017-08-19 NOTE — Progress Notes (Signed)
Assessment and Plan:   Type 2 diabetes mellitus with stage 3 chronic kidney disease, without long-term current use of insulin (HCC)  - poorly controlled Continue metformin; increase glipizide to 5 mg BID with meals (risks of hypoglycemia discussed), add cinnamon supplement 1000 mg BID Education: Reviewed 'ABCs' of diabetes management (respective goals in parentheses):  A1C (<7), blood pressure (<130/80), and cholesterol (LDL <70) Eye Exam yearly and Dental Exam every 6 months. Dietary recommendations Physical Activity recommendations - Strongly advisedto start checking sugars at different times of the day - check 2 times a day, rotating checks - given sugar log and advised how to fill it and to bring it at next appt  - given instructions for hypoglycemia management    TRY TO ADD ON WALKING, START LOW AT 20 MINS 2 DAYS A WEEK  Obesity with co morbidities Long discussion about weight loss, diet, and exercise Recommended diet heavy in fruits and veggies and low in animal meats, cheeses, and dairy products, appropriate calorie intake Patient will work on continuing to cut down on sugars, carbs, increase vegetables, walking Follow up at next visit  Further disposition pending results of labs. Discussed med's effects and SE's.   Over 15 minutes of exam, counseling, chart review, and critical decision making was performed.   Future Appointments  Date Time Provider Trent  10/23/2017 10:00 AM Unk Pinto, MD [facility] None    ------------------------------------------------------------------------------------------------------------------   HPI 71 y.o.male presents for 4 week follow up for T2DM monitoring and diabetes education after acute increase in A1Cs were noted at the last 2 visits; he is currently prescribed metformin 500 mg 4 tabs daily, and low dose glipizide (2.5 mg BID) was initiated at the last visit with instructions to start checking daily fasting blood sugars.  He presents with log today - he has not been checking fasting values - has been checking rotating postprandials - ranging from 105 - 325 - typical is mid 100s. He denies hypoglycemic episodes.   BMI is Body mass index is 31.97 kg/m., he has been working on diet and exercise- cut down significantly on sweet tea, avoiding sweets and breads.  Wt Readings from Last 3 Encounters:  08/20/17 222 lb 12.8 oz (101.1 kg)  07/17/17 223 lb (101.2 kg)  04/06/17 225 lb 9.6 oz (102.3 kg)    Component     Latest Ref Rng & Units 09/03/2015 12/10/2015 03/13/2016 06/14/2016  Hemoglobin A1C     <5.7 % of total Hgb 6.7 (H) 6.6 (H) 6.8 (H) 7.4 (H)  Mean Plasma Glucose     (calc) 146 (H) 143 148 166   Component     Latest Ref Rng & Units 09/21/2016 12/26/2016 04/06/2017 07/17/2017  Hemoglobin A1C     <5.7 % of total Hgb 7.1 (H) 7.6 (H) 9.4 (H) 9.3 (H)  Mean Plasma Glucose     (calc) 157 171 223 220      Past Medical History:  Diagnosis Date  . Gout   . Hyperlipidemia   . Hypertension   . OSA (obstructive sleep apnea)   . Pre-diabetes      Allergies  Allergen Reactions  . Cardura [Doxazosin Mesylate] Other (See Comments)    Nasal congestion  . Codeine Other (See Comments)    Pt passes out.  Marland Kitchen Hytrin [Terazosin]     Current Outpatient Medications on File Prior to Visit  Medication Sig  . ALPRAZolam (XANAX) 1 MG tablet TAKE ONE-HALF OR ONE TABLET BY MOUTH THREE TIMES A DAY  AS NEEDED  . aspirin 81 MG tablet Take 81 mg by mouth daily.  . chlorpheniramine (CHLOR-TRIMETON) 4 MG tablet Take 4 mg by mouth 2 (two) times daily as needed for allergies.  . Cholecalciferol (VITAMIN D3) 10000 units capsule Take 10,000 Units by mouth daily.  . fenofibrate 160 MG tablet TAKE 1 TABLET BY MOUTH  DAILY  . fluticasone (FLONASE) 50 MCG/ACT nasal spray USE 1 TO 2 SPRAYS IN EACH  NOSTRIL DAILY.  Marland Kitchen losartan-hydrochlorothiazide (HYZAAR) 100-25 MG tablet TAKE 1 TABLET BY MOUTH  DAILY  . metFORMIN (GLUCOPHAGE-XR) 500 MG  24 hr tablet TAKE 2 TABLETS BY MOUTH  TWICE DAILY ( 4 TABLETS  DAILY)  . mupirocin ointment (BACTROBAN) 2 % APPLY TWICE A DAY TO SKIN INFECTION.  Marland Kitchen omeprazole (PRILOSEC) 40 MG capsule TAKE 1 CAPSULE BY MOUTH  DAILY FOR ACID REFLUX  . oxybutynin (DITROPAN) 5 MG tablet TAKE 1 TABLET BY MOUTH 3  TIMES DAILY  . pravastatin (PRAVACHOL) 40 MG tablet TAKE 1 TABLET BY MOUTH AT  BEDTIME FOR CHOLESTEROL  . ranitidine (ZANTAC) 150 MG tablet Take 1 tablet (150 mg total) by mouth 2 (two) times daily.  . tamsulosin (FLOMAX) 0.4 MG CAPS capsule Take 1 capsule (0.4 mg total) by mouth daily.   No current facility-administered medications on file prior to visit.     ROS: all negative except above.   Physical Exam:  BP 122/78   Pulse 98   Temp 97.7 F (36.5 C)   Ht 5\' 10"  (1.778 m)   Wt 222 lb 12.8 oz (101.1 kg)   SpO2 94%   BMI 31.97 kg/m   General Appearance: Well nourished, in no apparent distress. Eyes: PERRLA, EOMs, conjunctiva no swelling or erythema Sinuses: No Frontal/maxillary tenderness ENT/Mouth: Ext aud canals clear, TMs without erythema, bulging. No erythema, swelling, or exudate on post pharynx.  Tonsils not swollen or erythematous. Hearing normal.  Neck: Supple, thyroid normal.  Respiratory: Respiratory effort normal, BS equal bilaterally without rales, rhonchi, wheezing or stridor.  Cardio: RRR with no MRGs. Brisk peripheral pulses without edema.  Abdomen: Soft, + BS.  Non tender, no guarding, rebound, hernias, masses. Lymphatics: Non tender without lymphadenopathy.  Musculoskeletal: Full ROM, 5/5 strength, normal gait.  Skin: Warm, dry without rashes, lesions, ecchymosis.  Neuro: Cranial nerves intact. Normal muscle tone, no cerebellar symptoms. Sensation intact.  Psych: Awake and oriented X 3, normal affect, Insight and Judgment appropriate.     Izora Ribas, NP 4:36 PM [facility]

## 2017-08-20 ENCOUNTER — Ambulatory Visit (INDEPENDENT_AMBULATORY_CARE_PROVIDER_SITE_OTHER): Payer: Medicare Other | Admitting: Adult Health

## 2017-08-20 ENCOUNTER — Encounter: Payer: Self-pay | Admitting: Adult Health

## 2017-08-20 VITALS — BP 122/78 | HR 98 | Temp 97.7°F | Ht 70.0 in | Wt 222.8 lb

## 2017-08-20 DIAGNOSIS — N183 Chronic kidney disease, stage 3 unspecified: Secondary | ICD-10-CM

## 2017-08-20 DIAGNOSIS — E669 Obesity, unspecified: Secondary | ICD-10-CM

## 2017-08-20 DIAGNOSIS — E1122 Type 2 diabetes mellitus with diabetic chronic kidney disease: Secondary | ICD-10-CM

## 2017-08-20 MED ORDER — GLIPIZIDE 5 MG PO TABS: 5.0000 mg | ORAL_TABLET | Freq: Two times a day (BID) | ORAL | 5 refills | Status: DC

## 2017-08-20 NOTE — Patient Instructions (Signed)
Start cinnamon capsule supplement 1000 mg twice a day for sugars  Increase glipizide to 5 mg (1 full tablet) twice a day.   Start walking 20 minutes a day      Bad carbs also include fruit juice, alcohol, and sweet tea. These are empty calories that do not signal to your brain that you are full.   Please remember the good carbs are still carbs which convert into sugar. So please measure them out no more than 1/2-1 cup of rice, oatmeal, pasta, and beans  Veggies are however free foods! Pile them on.   Not all fruit is created equal. Please see the list below, the fruit at the bottom is higher in sugars than the fruit at the top. Please avoid all dried fruits.

## 2017-08-27 ENCOUNTER — Encounter: Payer: Self-pay | Admitting: *Deleted

## 2017-08-31 DIAGNOSIS — K4090 Unilateral inguinal hernia, without obstruction or gangrene, not specified as recurrent: Secondary | ICD-10-CM | POA: Diagnosis not present

## 2017-09-11 ENCOUNTER — Encounter: Payer: Self-pay | Admitting: Internal Medicine

## 2017-09-17 ENCOUNTER — Other Ambulatory Visit: Payer: Self-pay | Admitting: Internal Medicine

## 2017-09-18 DIAGNOSIS — E119 Type 2 diabetes mellitus without complications: Secondary | ICD-10-CM | POA: Diagnosis not present

## 2017-09-18 LAB — HM DIABETES EYE EXAM

## 2017-09-23 ENCOUNTER — Other Ambulatory Visit: Payer: Self-pay | Admitting: Adult Health

## 2017-09-23 DIAGNOSIS — K219 Gastro-esophageal reflux disease without esophagitis: Secondary | ICD-10-CM

## 2017-10-23 ENCOUNTER — Encounter: Payer: Self-pay | Admitting: Internal Medicine

## 2017-10-23 ENCOUNTER — Ambulatory Visit (INDEPENDENT_AMBULATORY_CARE_PROVIDER_SITE_OTHER): Payer: Medicare Other | Admitting: Internal Medicine

## 2017-10-23 VITALS — BP 140/74 | HR 60 | Temp 97.3°F | Resp 18 | Ht 70.5 in | Wt 222.4 lb

## 2017-10-23 DIAGNOSIS — Z1212 Encounter for screening for malignant neoplasm of rectum: Secondary | ICD-10-CM

## 2017-10-23 DIAGNOSIS — N401 Enlarged prostate with lower urinary tract symptoms: Secondary | ICD-10-CM | POA: Diagnosis not present

## 2017-10-23 DIAGNOSIS — E559 Vitamin D deficiency, unspecified: Secondary | ICD-10-CM | POA: Diagnosis not present

## 2017-10-23 DIAGNOSIS — E782 Mixed hyperlipidemia: Secondary | ICD-10-CM | POA: Diagnosis not present

## 2017-10-23 DIAGNOSIS — Z79899 Other long term (current) drug therapy: Secondary | ICD-10-CM

## 2017-10-23 DIAGNOSIS — Z8249 Family history of ischemic heart disease and other diseases of the circulatory system: Secondary | ICD-10-CM

## 2017-10-23 DIAGNOSIS — K219 Gastro-esophageal reflux disease without esophagitis: Secondary | ICD-10-CM | POA: Diagnosis not present

## 2017-10-23 DIAGNOSIS — Z136 Encounter for screening for cardiovascular disorders: Secondary | ICD-10-CM

## 2017-10-23 DIAGNOSIS — M109 Gout, unspecified: Secondary | ICD-10-CM | POA: Diagnosis not present

## 2017-10-23 DIAGNOSIS — G4733 Obstructive sleep apnea (adult) (pediatric): Secondary | ICD-10-CM

## 2017-10-23 DIAGNOSIS — I10 Essential (primary) hypertension: Secondary | ICD-10-CM

## 2017-10-23 DIAGNOSIS — Z125 Encounter for screening for malignant neoplasm of prostate: Secondary | ICD-10-CM

## 2017-10-23 DIAGNOSIS — E1122 Type 2 diabetes mellitus with diabetic chronic kidney disease: Secondary | ICD-10-CM | POA: Diagnosis not present

## 2017-10-23 DIAGNOSIS — Z1211 Encounter for screening for malignant neoplasm of colon: Secondary | ICD-10-CM

## 2017-10-23 DIAGNOSIS — N183 Chronic kidney disease, stage 3 (moderate): Secondary | ICD-10-CM

## 2017-10-23 NOTE — Patient Instructions (Signed)

## 2017-10-23 NOTE — Progress Notes (Signed)
[facility]   Unk Pinto, M.D.     Uvaldo Bristle. Silverio Lay, P.A.-C Liane Comber, [place]                [address] [place], [place] [address] Telephone [phone] Telefax [phone]  Comprehensive Evaluation & Examination     This very nice 72 y.o. MWM presents for a  comprehensive evaluation and management of multiple medical co-morbidities.  Patient has been followed for HTN, HLD, T2_NIDDM  and Vitamin D Deficiency. Patient has OSA and reports using CPAP with improved restorative sleep      HTN predates since 1992. Patient's BP has been controlled at home.  Today's BP is upper limits of goal - 140/74. Patient denies any cardiac symptoms as chest pain, palpitations, shortness of breath, dizziness or ankle swelling.     Patient's hyperlipidemia is controlled with diet and medications. Patient denies myalgias or other medication SE's. Last lipids were at goal albeit elevated Trig's: Lab Results  Component Value Date   CHOL 179 07/17/2017   HDL 26 (L) 07/17/2017   LDLCALC 72 12/26/2016   TRIG 240 (H) 07/17/2017   CHOLHDL 6.9 (H) 07/17/2017      Patient has hx/o Morbid Obesity (BMI 31+) and PreDM since 2008 and then T2_NIDDM since 2014 and patient denies reactive hypoglycemic symptoms, visual blurring, diabetic polys or paresthesias.  Patient admits poor dietary compliance and last A1c was not at goal: Lab Results  Component Value Date   HGBA1C 9.3 (H) 07/17/2017       Finally, patient has history of Vitamin D Deficiency ("22"/2008) and last vitamin D was near goal (70-100): Lab Results  Component Value Date   VD25OH 52 04/06/2017   Current Outpatient Medications on File Prior to Visit  Medication Sig  . ALPRAZolam (XANAX) 1 MG tablet Take 1/2 to 1 tablet 2 to 3 x/day ONLY if needed for Anxiety Attack and please try to limit to 5 days /week to avoid addiction  . aspirin 81 MG tablet  Take 81 mg by mouth daily.  . chlorpheniramine (CHLOR-TRIMETON) 4 MG tablet Take 4 mg by mouth 2 (two) times daily as needed for allergies.  . Cholecalciferol (VITAMIN D3) 10000 units capsule Take 10,000 Units by mouth daily.  . fenofibrate 160 MG tablet TAKE 1 TABLET BY MOUTH  DAILY  . fluticasone (FLONASE) 50 MCG/ACT nasal spray USE 1 TO 2 SPRAYS IN EACH  NOSTRIL DAILY.  Marland Kitchen glipiZIDE (GLUCOTROL) 5 MG tablet Take 1 tablet (5 mg total) by mouth 2 (two) times daily before a meal.  . losartan-hydrochlorothiazide (HYZAAR) 100-25 MG tablet TAKE 1 TABLET BY MOUTH  DAILY  . metFORMIN (GLUCOPHAGE-XR) 500 MG 24 hr tablet TAKE 2 TABLETS BY MOUTH  TWICE DAILY ( 4 TABLETS  DAILY)  . mupirocin ointment (BACTROBAN) 2 % APPLY TWICE A DAY TO SKIN INFECTION.  Marland Kitchen oxybutynin (DITROPAN) 5 MG tablet TAKE 1 TABLET BY MOUTH 3  TIMES DAILY  . pravastatin (PRAVACHOL) 40 MG tablet TAKE 1 TABLET BY MOUTH AT  BEDTIME FOR CHOLESTEROL  . ranitidine (ZANTAC) 150 MG tablet TAKE 1 TABLET(150 MG) BY MOUTH TWICE DAILY  . tamsulosin (FLOMAX) 0.4 MG CAPS capsule Take 1 capsule (0.4 mg total) by mouth daily.   No current facility-administered medications on file prior to visit.    Allergies  Allergen Reactions  . Cardura [Doxazosin Mesylate]  Other (See Comments)    Nasal congestion  . Codeine Other (See Comments)    Pt passes out.  Marland Kitchen Hytrin [Terazosin]    Past Medical History:  Diagnosis Date  . Gout   . Hyperlipidemia   . Hypertension   . OSA (obstructive sleep apnea)   . Pre-diabetes    Health Maintenance  Topic Date Due  . Hepatitis C Screening  03-22-1946  . OPHTHALMOLOGY EXAM  08/29/2017  . FOOT EXAM  12/26/2017  . HEMOGLOBIN A1C  01/14/2018  . COLONOSCOPY  10/13/2018  . TETANUS/TDAP  05/31/2025  . INFLUENZA VACCINE  Completed  . PNA vac Low Risk Adult  Completed   Immunization History  Administered Date(s) Administered  . DT 06/01/2015  . Influenza Whole 06/05/2013  . Influenza, High Dose Seasonal PF  06/05/2014, 06/01/2015, 06/14/2016, 06/22/2017  . Pneumococcal Conjugate-13 08/19/2014  . Pneumococcal Polysaccharide-23 08/22/2003, 12/10/2015  . Tdap 03/08/2004  . Zoster 08/18/2013   Last Colon - 10/13/2008 - Dr Sammuel Cooper - recc 10 yr f/u due 09/2018  Past Surgical History:  Procedure Laterality Date  . FOOT SURGERY  10/2009  . MYRINGOTOMY Left 02-2005   Dr.Newman  . TONSILLECTOMY  1953   Family History  Problem Relation Age of Onset  . Hypertension Mother   . GER disease Mother   . Hyperlipidemia Father   . Hypertension Father   . Parkinson's disease Father   . Hypertension Brother   . Diabetes Maternal Grandmother   . Stroke Paternal Grandmother   . Diabetes Paternal Grandmother   . Stroke Paternal Grandfather    Social History   Socioeconomic History  . Marital status: Married    Spouse name: Vickii Chafe  . Number of children: 1 adopted daughter  Occupational History  . 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 Smoking status: Never Smoker  . Smokeless tobacco: Never Used  Substance and Sexual Activity  . Alcohol use: No  . Drug use: No  . Sexual activity: Not on file    ROS Constitutional: Denies fever, chills, weight loss/gain, headaches, insomnia,  night sweats or change in appetite. Does c/o fatigue. Eyes: Denies redness, blurred vision, diplopia, discharge, itchy or watery eyes.  ENT: Denies discharge, congestion, post nasal drip, epistaxis, sore throat, earache, hearing loss, dental pain, Tinnitus, Vertigo, Sinus pain or snoring.  Cardio: Denies chest pain, palpitations, irregular heartbeat, syncope, dyspnea, diaphoresis, orthopnea, PND, claudication or edema Respiratory: denies cough, dyspnea, DOE, pleurisy, hoarseness, laryngitis or wheezing.  Gastrointestinal: Denies dysphagia, heartburn, reflux, water brash, pain, cramps, nausea, vomiting, bloating, diarrhea, constipation, hematemesis, melena, hematochezia, jaundice or  hemorrhoids Genitourinary: Denies dysuria, frequency, urgency, nocturia, hesitancy, discharge, hematuria or flank pain Musculoskeletal: Denies arthralgia, myalgia, stiffness, Jt. Swelling, pain, limp or strain/sprain. Denies Falls. Skin: Denies puritis, rash, hives, warts, acne, eczema or change in skin lesion Neuro: No weakness, tremor, incoordination, spasms, paresthesia or pain Psychiatric: Denies confusion, memory loss or sensory loss. Denies Depression. Endocrine: Denies change in weight, skin, hair change, nocturia, and paresthesia, diabetic polys, visual blurring or hyper / hypo glycemic episodes.  Heme/Lymph: No excessive bleeding, bruising or enlarged lymph nodes.  Physical Exam  BP 140/74   Pulse 60   Temp (!) 97.3 F (36.3 C)   Resp 18   Ht 5' 10.5" (1.791 m)   Wt 222 lb 6.4 oz (100.9 kg)   BMI 31.46 kg/m   General Appearance: Over nourished and well groomed and in no apparent distress.  Eyes: PERRLA,  EOMs, conjunctiva no swelling or erythema, normal fundi and vessels. Sinuses: No frontal/maxillary tenderness ENT/Mouth: EACs patent / TMs  nl. Nares clear without erythema, swelling, mucoid exudates. Oral hygiene is good. No erythema, swelling, or exudate. Tongue normal, non-obstructing. Tonsils not swollen or erythematous. Hearing normal.  Neck: Supple, thyroid not palpable. No bruits, nodes or JVD. Respiratory: Respiratory effort normal.  BS equal and clear bilateral without rales, rhonci, wheezing or stridor. Cardio: Heart sounds are normal with regular rate and rhythm and no murmurs, rubs or gallops. Peripheral pulses are normal and equal bilaterally without edema. No aortic or femoral bruits. Chest: symmetric with normal excursions and percussion.  Abdomen: Soft, with Nl bowel sounds. Nontender, no guarding, rebound, hernias, masses, or organomegaly.  Lymphatics: Non tender without lymphadenopathy.  Genitourinary: No hernias.Testes nl. DRE - prostate nl for age - smooth  & firm w/o nodules. Musculoskeletal: Full ROM all peripheral extremities, joint stability, 5/5 strength, and normal gait. Skin: Warm and dry without rashes, lesions, cyanosis, clubbing or  ecchymosis.  Neuro: Cranial nerves intact, reflexes equal bilaterally. Normal muscle tone, no cerebellar symptoms. Sensation intact to touch, vibratory and Monofilament to the toes bilaterally. Pysch: Alert and oriented X 3 with normal affect, insight and judgment appropriate.   Assessment and Plan  1. Annual Preventative/Screening Exam        Patient was counseled in prudent diet, weight control to achieve/maintain BMI less than 25, BP monitoring, regular exercise and medications as discussed.  Discussed med effects and SE's. Routine screening labs and tests as requested with regular follow-up as recommended. Over 40 minutes of exam, counseling, chart review and high complex critical decision making was performed

## 2017-10-24 LAB — HEMOGLOBIN A1C
HEMOGLOBIN A1C: 5.8 %{Hb} — AB (ref <5.7)
Mean Plasma Glucose: 120 (calc)
eAG (mmol/L): 6.6 (calc)

## 2017-10-24 LAB — BASIC METABOLIC PANEL WITH GFR
BUN/Creatinine Ratio: 15 (calc) (ref 6–22)
BUN: 20 mg/dL (ref 7–25)
CALCIUM: 10 mg/dL (ref 8.6–10.3)
CO2: 27 mmol/L (ref 20–32)
CREATININE: 1.35 mg/dL — AB (ref 0.70–1.18)
Chloride: 104 mmol/L (ref 98–110)
GFR, EST AFRICAN AMERICAN: 61 mL/min/{1.73_m2} (ref >=60)
GFR, EST NON AFRICAN AMERICAN: 53 mL/min/{1.73_m2} — AB (ref >=60)
Glucose, Bld: 155 mg/dL — ABNORMAL HIGH (ref 65–99)
Potassium: 3.8 mmol/L (ref 3.5–5.3)
Sodium: 142 mmol/L (ref 135–146)

## 2017-10-24 LAB — URINALYSIS, ROUTINE W REFLEX MICROSCOPIC
Bilirubin Urine: NEGATIVE
Glucose, UA: NEGATIVE
Hgb urine dipstick: NEGATIVE
KETONES UR: NEGATIVE
Leukocytes, UA: NEGATIVE
NITRITE: NEGATIVE
PROTEIN: NEGATIVE
SPECIFIC GRAVITY, URINE: 1.021 (ref 1.001–1.03)
pH: <=5 (ref 5.0–8.0)

## 2017-10-24 LAB — CBC WITH DIFFERENTIAL/PLATELET
BASOS ABS: 62 {cells}/uL (ref 0–200)
Basophils Relative: 0.9 %
EOS PCT: 3.9 %
Eosinophils Absolute: 269 cells/uL (ref 15–500)
HCT: 40.3 % (ref 38.5–50.0)
Hemoglobin: 14.1 g/dL (ref 13.2–17.1)
LYMPHS ABS: 1898 {cells}/uL (ref 850–3900)
MCH: 29.6 pg (ref 27.0–33.0)
MCHC: 35 g/dL (ref 32.0–36.0)
MCV: 84.5 fL (ref 80.0–100.0)
MONOS PCT: 8.4 %
MPV: 10.3 fL (ref 7.5–12.5)
NEUTROS PCT: 59.3 %
Neutro Abs: 4092 cells/uL (ref 1500–7800)
PLATELETS: 220 10*3/uL (ref 140–400)
RBC: 4.77 10*6/uL (ref 4.20–5.80)
RDW: 13.4 % (ref 11.0–15.0)
TOTAL LYMPHOCYTE: 27.5 %
WBC mixed population: 580 cells/uL (ref 200–950)
WBC: 6.9 10*3/uL (ref 3.8–10.8)

## 2017-10-24 LAB — MAGNESIUM: Magnesium: 1.8 mg/dL (ref 1.5–2.5)

## 2017-10-24 LAB — HEPATIC FUNCTION PANEL
AG RATIO: 2 (calc) (ref 1.0–2.5)
ALBUMIN MSPROF: 4.7 g/dL (ref 3.6–5.1)
ALT: 13 U/L (ref 9–46)
AST: 18 U/L (ref 10–35)
Alkaline phosphatase (APISO): 53 U/L (ref 40–115)
BILIRUBIN DIRECT: 0.1 mg/dL (ref 0.0–0.2)
BILIRUBIN TOTAL: 0.4 mg/dL (ref 0.2–1.2)
GLOBULIN: 2.4 g/dL (ref 1.9–3.7)
Indirect Bilirubin: 0.3 mg/dL (calc) (ref 0.2–1.2)
Total Protein: 7.1 g/dL (ref 6.1–8.1)

## 2017-10-24 LAB — URIC ACID: URIC ACID, SERUM: 5.9 mg/dL (ref 4.0–8.0)

## 2017-10-24 LAB — LIPID PANEL
Cholesterol: 191 mg/dL (ref <200)
HDL: 38 mg/dL — ABNORMAL LOW (ref >40)
LDL CHOLESTEROL (CALC): 119 mg/dL — AB
Non-HDL Cholesterol (Calc): 153 mg/dL (calc) — ABNORMAL HIGH (ref <130)
TRIGLYCERIDES: 225 mg/dL — AB (ref <150)
Total CHOL/HDL Ratio: 5 (calc) — ABNORMAL HIGH (ref <5.0)

## 2017-10-24 LAB — MICROALBUMIN / CREATININE URINE RATIO
CREATININE, URINE: 119 mg/dL (ref 20–320)
MICROALB UR: 0.7 mg/dL
Microalb Creat Ratio: 6 mcg/mg creat (ref <30)

## 2017-10-24 LAB — INSULIN, RANDOM: Insulin: 23.7 u[IU]/mL — ABNORMAL HIGH (ref 2.0–19.6)

## 2017-10-24 LAB — PSA: PSA: 0.7 ng/mL (ref <=4.0)

## 2017-10-24 LAB — VITAMIN D 25 HYDROXY (VIT D DEFICIENCY, FRACTURES): VIT D 25 HYDROXY: 56 ng/mL (ref 30–100)

## 2017-10-24 LAB — TSH: TSH: 2.66 m[IU]/L (ref 0.40–4.50)

## 2017-10-25 ENCOUNTER — Other Ambulatory Visit: Payer: Self-pay

## 2017-10-25 DIAGNOSIS — E1122 Type 2 diabetes mellitus with diabetic chronic kidney disease: Secondary | ICD-10-CM

## 2017-10-25 DIAGNOSIS — N183 Chronic kidney disease, stage 3 (moderate): Principal | ICD-10-CM

## 2017-10-25 MED ORDER — GLIPIZIDE 5 MG PO TABS: 5.0000 mg | ORAL_TABLET | Freq: Two times a day (BID) | ORAL | 2 refills | Status: DC

## 2017-11-27 ENCOUNTER — Other Ambulatory Visit: Payer: Self-pay | Admitting: *Deleted

## 2017-11-27 MED ORDER — GLUCOSE BLOOD VI STRP: ORAL_STRIP | 5 refills | Status: DC

## 2017-12-31 ENCOUNTER — Other Ambulatory Visit: Payer: Self-pay

## 2017-12-31 DIAGNOSIS — Z1211 Encounter for screening for malignant neoplasm of colon: Secondary | ICD-10-CM

## 2017-12-31 DIAGNOSIS — Z1212 Encounter for screening for malignant neoplasm of rectum: Principal | ICD-10-CM

## 2017-12-31 LAB — POC HEMOCCULT BLD/STL (HOME/3-CARD/SCREEN)
FECAL OCCULT BLD: NEGATIVE
FECAL OCCULT BLD: NEGATIVE
FECAL OCCULT BLD: NEGATIVE

## 2018-01-01 DIAGNOSIS — Z1211 Encounter for screening for malignant neoplasm of colon: Secondary | ICD-10-CM | POA: Diagnosis not present

## 2018-01-19 ENCOUNTER — Other Ambulatory Visit: Payer: Self-pay | Admitting: Internal Medicine

## 2018-02-18 DIAGNOSIS — F419 Anxiety disorder, unspecified: Secondary | ICD-10-CM | POA: Insufficient documentation

## 2018-02-18 NOTE — Progress Notes (Signed)
MEDICARE ANNUAL WELLNESS VISIT AND FOLLOW UP Assessment:     Medicare annual wellness visit  Essential hypertension -well controlled currently -cont meds -dash diet -monitor at home  OSA (obstructive sleep apnea) -cont use of cpap machine -sleep hygiene -lose weight  Type II diabetes mellitus with nephropathy ([place]) -working hard on losing weight; A1C well controlled; will start taper off medications pending A1C results today, starting with glipizide decrease to 2.5 mg  -cont diet and exercise - Hemoglobin A1c  BPH/Prostatism -cont meds -currently without change from prior visit -followed by urology   Hyperlipidemia -cont meds -diet and exercise - Lipid panel   Idiopathic gout, unspecified chronicity, unspecified site Gout- recheck Uric acid as needed, doing well off of allopurinol Diet discussed   Vitamin D deficiency -cont supplement   Medication management - CBC with Differential/Platelet - CMP/GFR  Obesity - BMI 30-34 Long discussion about weight loss, diet, and exercise Recommended diet heavy in fruits and veggies and low in animal meats, cheeses, and dairy products, appropriate calorie intake Discussed appropriate weight for height  Follow up at next visit     Over 30 minutes of exam, counseling, chart review, and critical decision making was performed Future Appointments  Date Time Provider [place]  06/03/2018 10:30 AM Unk Pinto, MD [facility] None  11/20/2018 10:00 AM Unk Pinto, MD [facility] None    Plan:   During the course of the visit the patient was educated and counseled about appropriate screening and preventive services including:    Pneumococcal vaccine   Influenza vaccine  Prevnar 13  Td vaccine  Screening electrocardiogram  Colorectal cancer screening  Diabetes screening  Glaucoma screening  Nutrition counseling    Subjective:  Jeffery Reed is a 72 y.o. male who presents for Medicare Annual  Wellness Visit and 3 month follow up for HTN, hyperlipidemia, T2 diabetes, and vitamin D Def.   he has a diagnosis of anxiety and is currently prescribed xanax 0.5-1 mg TID PRN, reports symptoms are well controlled on current regimen. he currently takes 1/2-1 tab once daily, this is up from previous as he reports is under stress regarding his father moving into a nursing home.   BMI is Body mass index is 31.77 kg/m., he has been working on diet and exercise. Wt Readings from Last 3 Encounters:  02/20/18 224 lb 9.6 oz (101.9 kg)  10/23/17 222 lb 6.4 oz (100.9 kg)  08/20/17 222 lb 12.8 oz (101.1 kg)   His blood pressure has been controlled at home, today their BP is BP: 120/82 He does not workout. He denies chest pain, shortness of breath, dizziness.   He is on cholesterol medication (pravastatin 40 mg daily) and denies myalgias. His cholesterol is not at goal. The cholesterol last visit was:   Lab Results  Component Value Date   CHOL 191 10/23/2017   HDL 38 (L) 10/23/2017   LDLCALC 119 (H) 10/23/2017   TRIG 225 (H) 10/23/2017   CHOLHDL 5.0 (H) 10/23/2017   He has been working on diet and exercise for diabetes with CKD, and denies foot ulcerations, hyperglycemia, hypoglycemia , increased appetite, nausea, paresthesia of the feet, polydipsia, polyuria, visual disturbances, vomiting and weight loss. He checks random fasting glucoses; has been running 90-120, currently on metformin 2000 mg and glipizide 5 mg twice daily. Last A1C in the office was:  Lab Results  Component Value Date   HGBA1C 5.8 (H) 10/23/2017   Last GFR Lab Results  Component Value Date   [facility]  53 (L) 10/23/2017    Patient is on Vitamin D supplement.   Lab Results  Component Value Date   VD25OH 56 10/23/2017       Medication Review: Current Outpatient Medications on File Prior to Visit  Medication Sig Dispense Refill  . ALPRAZolam (XANAX) 1 MG tablet TAKE 1/2 TO 1 TABLET 2 TO 3 TIMES DAILY ONLY IF NEEDED   FOR ANXIETY ATTACK. PLEASE  TRY TO LIMIT TO 5 DAYS/WEEK TO AVOID ADDICTION 90 tablet 0  . aspirin 81 MG tablet Take 81 mg by mouth daily.    . chlorpheniramine (CHLOR-TRIMETON) 4 MG tablet Take 4 mg by mouth 2 (two) times daily as needed for allergies.    . Cholecalciferol (VITAMIN D3) 10000 units capsule Take 10,000 Units by mouth daily.    . fenofibrate 160 MG tablet TAKE 1 TABLET BY MOUTH  DAILY 90 tablet 1  . fluticasone (FLONASE) 50 MCG/ACT nasal spray USE 1 TO 2 SPRAYS IN EACH  NOSTRIL DAILY. 48 g 1  . glipiZIDE (GLUCOTROL) 5 MG tablet Take 1 tablet (5 mg total) by mouth 2 (two) times daily before a meal. 180 tablet 2  . glucose blood (FREESTYLE LITE) test strip Check blood sugar 1 time daily-DX-E11.22 100 each 5  . losartan-hydrochlorothiazide (HYZAAR) 100-25 MG tablet TAKE 1 TABLET BY MOUTH  DAILY 90 tablet 1  . metFORMIN (GLUCOPHAGE-XR) 500 MG 24 hr tablet TAKE 2 TABLETS BY MOUTH  TWICE DAILY ( 4 TABLETS  DAILY) 360 tablet 1  . mupirocin ointment (BACTROBAN) 2 % APPLY TWICE A DAY TO SKIN INFECTION. 66 g 1  . oxybutynin (DITROPAN) 5 MG tablet TAKE 1 TABLET BY MOUTH 3  TIMES DAILY 270 tablet 1  . pravastatin (PRAVACHOL) 40 MG tablet TAKE 1 TABLET BY MOUTH AT  BEDTIME FOR CHOLESTEROL 90 tablet 1  . ranitidine (ZANTAC) 150 MG tablet TAKE 1 TABLET(150 MG) BY MOUTH TWICE DAILY 180 tablet 3  . tamsulosin (FLOMAX) 0.4 MG CAPS capsule Take 1 capsule (0.4 mg total) by mouth daily. 90 capsule 4   No current facility-administered medications on file prior to visit.     Current Problems (verified) Patient Active Problem List   Diagnosis Date Noted  . Anxiety 02/18/2018  . GERD (gastroesophageal reflux disease) 03/12/2016  . Obesity (BMI 30.0-34.9) 06/01/2015  . Medication management 08/19/2014  . BPH/Prostatism 08/19/2014  . Essential hypertension 08/17/2013  . Type 2 diabetes mellitus with stage 3 chronic kidney disease, without long-term current use of insulin ([place]) 08/17/2013  . Vitamin D  deficiency 08/17/2013  . Hyperlipidemia   . Testosterone deficiency   . Gout   . OSA (obstructive sleep apnea)     Screening Tests Immunization History  Administered Date(s) Administered  . DT 06/01/2015  . Influenza Whole 06/05/2013  . Influenza, High Dose Seasonal PF 06/05/2014, 06/01/2015, 06/14/2016, 06/22/2017  . Pneumococcal Conjugate-13 08/19/2014  . Pneumococcal Polysaccharide-23 08/22/2003, 12/10/2015  . Tdap 03/08/2004  . Zoster 08/18/2013   Preventative care: Last colonoscopy: 2010 Ct head 2010 CXR 2010  Prior vaccinations: TD or Tdap: 2016  Influenza: 2018  Pneumococcal: 2017 Prevnar13: 2015 Shingles/Zostavax: 2014  Names of Other Physician/Practitioners you currently use: 1. [facility] here for primary care 2. [facility], eye doctor, last visit 09/2017 - requested diabetic eye report 3. Dr. Leonides Sake, dentist, 02/19/2018  Patient Care Team: Unk Pinto, MD as PCP - General (Internal Medicine) Franchot Gallo, MD as Consulting Physician (Urology)  Allergies Allergies  Allergen Reactions  .  Cardura [Doxazosin Mesylate] Other (See Comments)    Nasal congestion  . Codeine Other (See Comments)    Pt passes out.  Marland Kitchen Hytrin [Terazosin]     SURGICAL HISTORY He  has a past surgical history that includes Foot surgery (10/2009); Tonsillectomy (1953); and Myringotomy (Left, 02-2005). FAMILY HISTORY His family history includes Diabetes in his maternal grandmother and paternal grandmother; GER disease in his mother; Hyperlipidemia in his father; Hypertension in his brother, father, and mother; Parkinson's disease in his father; Stroke in his paternal grandfather and paternal grandmother. SOCIAL HISTORY He  reports that he has never smoked. He has never used smokeless tobacco. He reports that he does not drink alcohol or use drugs.  MEDICARE WELLNESS OBJECTIVES: Physical activity: Current Exercise Habits: The patient has a  physically strenous job, but has no regular exercise apart from work., Exercise limited by: None identified Cardiac risk factors: Cardiac Risk Factors include: advanced age (>31men, >42 women);male gender;hypertension;dyslipidemia;diabetes mellitus;obesity (BMI >30kg/m2) Depression/mood screen:   Depression screen [facility] 2/9 02/20/2018  Decreased Interest 0  Down, Depressed, Hopeless 0  PHQ - 2 Score 0    ADLs:  In your present state of health, do you have any difficulty performing the following activities: 02/20/2018 10/23/2017  Hearing? N N  Vision? N N  Difficulty concentrating or making decisions? N N  Walking or climbing stairs? N N  Dressing or bathing? N N  Doing errands, shopping? N N  Some recent data might be hidden     Cognitive Testing  Alert? Yes  Normal Appearance?Yes  Oriented to person? Yes  Place? Yes   Time? Yes  Recall of three objects?  Yes  Can perform simple calculations? Yes  Displays appropriate judgment?Yes  Can read the correct time from a watch face?Yes  EOL planning: Does Patient Have a Medical Advance Directive?: Yes Type of Advance Directive: Healthcare Power of Attorney, Living will Does patient want to make changes to medical advance directive?: No - Patient declined Copy of [place] in Chart?: Yes   Objective:   Today's Vitals   02/20/18 0837  BP: 120/82  Pulse: 66  Resp: 16  Temp: (!) 97.5 F (36.4 C)  SpO2: 99%  Weight: 224 lb 9.6 oz (101.9 kg)  Height: 5' 10.5" (1.791 m)   Body mass index is 31.77 kg/m.  General appearance: alert, no distress, WD/WN, male HEENT: normocephalic, sclerae anicteric, TMs pearly, nares patent, no discharge or erythema, pharynx normal Oral cavity: MMM, no lesions Neck: supple, no lymphadenopathy, no thyromegaly, no masses Heart: RRR, normal S1, S2, no murmurs Lungs: CTA bilaterally, no wheezes, rhonchi, or rales Abdomen: +bs, soft, non tender, non distended, no masses, no hepatomegaly, no  splenomegaly Musculoskeletal: nontender, no swelling, no obvious deformity Extremities: no edema, no cyanosis, no clubbing Pulses: 2+ symmetric, upper and lower extremities, normal cap refill Neurological: alert, oriented x 3, CN2-12 intact, strength normal upper extremities and lower extremities, sensation normal throughout, DTRs 2+ throughout, no cerebellar signs, gait normal Psychiatric: normal affect, behavior normal, pleasant   Medicare Attestation I have personally reviewed: The patient's medical and social history Their use of alcohol, tobacco or illicit drugs Their current medications and supplements The patient's functional ability including ADLs,fall risks, home safety risks, cognitive, and hearing and visual impairment Diet and physical activities Evidence for depression or mood disorders  The patient's weight, height, BMI, and visual acuity have been recorded in the chart.  I have made referrals, counseling, and provided education to the  patient based on review of the above and I have provided the patient with a written personalized care plan for preventive services.     Izora Ribas, NP   02/20/2018

## 2018-02-20 ENCOUNTER — Ambulatory Visit (INDEPENDENT_AMBULATORY_CARE_PROVIDER_SITE_OTHER): Payer: Medicare Other | Admitting: Adult Health

## 2018-02-20 ENCOUNTER — Encounter: Payer: Self-pay | Admitting: Adult Health

## 2018-02-20 VITALS — BP 120/82 | HR 66 | Temp 97.5°F | Resp 16 | Ht 70.5 in | Wt 224.6 lb

## 2018-02-20 DIAGNOSIS — N183 Chronic kidney disease, stage 3 (moderate): Secondary | ICD-10-CM | POA: Diagnosis not present

## 2018-02-20 DIAGNOSIS — F419 Anxiety disorder, unspecified: Secondary | ICD-10-CM

## 2018-02-20 DIAGNOSIS — E559 Vitamin D deficiency, unspecified: Secondary | ICD-10-CM | POA: Diagnosis not present

## 2018-02-20 DIAGNOSIS — N138 Other obstructive and reflux uropathy: Secondary | ICD-10-CM | POA: Diagnosis not present

## 2018-02-20 DIAGNOSIS — E782 Mixed hyperlipidemia: Secondary | ICD-10-CM | POA: Diagnosis not present

## 2018-02-20 DIAGNOSIS — I10 Essential (primary) hypertension: Secondary | ICD-10-CM

## 2018-02-20 DIAGNOSIS — E349 Endocrine disorder, unspecified: Secondary | ICD-10-CM | POA: Diagnosis not present

## 2018-02-20 DIAGNOSIS — E669 Obesity, unspecified: Secondary | ICD-10-CM | POA: Diagnosis not present

## 2018-02-20 DIAGNOSIS — Z79899 Other long term (current) drug therapy: Secondary | ICD-10-CM | POA: Diagnosis not present

## 2018-02-20 DIAGNOSIS — E1122 Type 2 diabetes mellitus with diabetic chronic kidney disease: Secondary | ICD-10-CM | POA: Diagnosis not present

## 2018-02-20 DIAGNOSIS — N401 Enlarged prostate with lower urinary tract symptoms: Secondary | ICD-10-CM | POA: Diagnosis not present

## 2018-02-20 DIAGNOSIS — G4733 Obstructive sleep apnea (adult) (pediatric): Secondary | ICD-10-CM

## 2018-02-20 DIAGNOSIS — M1A00X0 Idiopathic chronic gout, unspecified site, without tophus (tophi): Secondary | ICD-10-CM

## 2018-02-20 DIAGNOSIS — Z0000 Encounter for general adult medical examination without abnormal findings: Secondary | ICD-10-CM

## 2018-02-20 DIAGNOSIS — R6889 Other general symptoms and signs: Secondary | ICD-10-CM

## 2018-02-20 DIAGNOSIS — Z0001 Encounter for general adult medical examination with abnormal findings: Secondary | ICD-10-CM | POA: Diagnosis not present

## 2018-02-20 DIAGNOSIS — K219 Gastro-esophageal reflux disease without esophagitis: Secondary | ICD-10-CM

## 2018-02-20 NOTE — Patient Instructions (Addendum)
Weight goal: 215 lb this fall - this will get you out of the "obese" range -      Aim for 7+ servings of fruits and vegetables daily  80+ fluid ounces of water or unsweet tea for healthy kidneys  Limit animal fats in diet for cholesterol and heart health - choose grass fed whenever available  Aim for low stress - take time to unwind and care for your mental health  Aim for 150 min of moderate intensity exercise weekly for heart health, and weights twice weekly for bone health  Aim for 7-9 hours of sleep daily     When it comes to diets, agreement about the perfect plan isn't easy to find, even among the experts. Experts at the [employer] developed an idea known as the Healthy Eating Plate. Just imagine a plate divided into logical, healthy portions.  The emphasis is on diet quality:  Load up on vegetables and fruits - one-half of your plate: Aim for color and variety, and remember that potatoes don't count.  Go for whole grains - one-quarter of your plate: Whole wheat, barley, wheat berries, quinoa, oats, brown rice, and foods made with them. If you want pasta, go with whole wheat pasta.  Protein power - one-quarter of your plate: Fish, chicken, beans, and nuts are all healthy, versatile protein sources. Limit red meat.  The diet, however, does go beyond the plate, offering a few other suggestions.  Use healthy plant oils, such as olive, canola, soy, corn, sunflower and peanut. Check the labels, and avoid partially hydrogenated oil, which have unhealthy trans fats.  If you're thirsty, drink water. Coffee and tea are good in moderation, but skip sugary drinks and limit milk and dairy products to one or two daily servings.  The type of carbohydrate in the diet is more important than the amount. Some sources of carbohydrates, such as vegetables, fruits, whole grains, and beans-are healthier than others.  Finally, stay active.

## 2018-02-21 LAB — CBC WITH DIFFERENTIAL/PLATELET
Basophils Absolute: 50 cells/uL (ref 0–200)
Basophils Relative: 0.7 %
EOS PCT: 4 %
Eosinophils Absolute: 284 cells/uL (ref 15–500)
HCT: 42.1 % (ref 38.5–50.0)
HEMOGLOBIN: 14.2 g/dL (ref 13.2–17.1)
Lymphs Abs: 1619 cells/uL (ref 850–3900)
MCH: 29 pg (ref 27.0–33.0)
MCHC: 33.7 g/dL (ref 32.0–36.0)
MCV: 86.1 fL (ref 80.0–100.0)
MONOS PCT: 8.4 %
MPV: 10.4 fL (ref 7.5–12.5)
NEUTROS ABS: 4551 {cells}/uL (ref 1500–7800)
Neutrophils Relative %: 64.1 %
Platelets: 198 10*3/uL (ref 140–400)
RBC: 4.89 10*6/uL (ref 4.20–5.80)
RDW: 14.1 % (ref 11.0–15.0)
Total Lymphocyte: 22.8 %
WBC mixed population: 596 cells/uL (ref 200–950)
WBC: 7.1 10*3/uL (ref 3.8–10.8)

## 2018-02-21 LAB — COMPLETE METABOLIC PANEL WITH GFR
AG Ratio: 1.8 (calc) (ref 1.0–2.5)
ALBUMIN MSPROF: 4.4 g/dL (ref 3.6–5.1)
ALKALINE PHOSPHATASE (APISO): 52 U/L (ref 40–115)
ALT: 11 U/L (ref 9–46)
AST: 16 U/L (ref 10–35)
BUN / CREAT RATIO: 16 (calc) (ref 6–22)
BUN: 22 mg/dL (ref 7–25)
CALCIUM: 9.4 mg/dL (ref 8.6–10.3)
CO2: 26 mmol/L (ref 20–32)
CREATININE: 1.39 mg/dL — AB (ref 0.70–1.18)
Chloride: 104 mmol/L (ref 98–110)
GFR, EST AFRICAN AMERICAN: 59 mL/min/{1.73_m2} — AB (ref >=60)
GFR, EST NON AFRICAN AMERICAN: 51 mL/min/{1.73_m2} — AB (ref >=60)
GLUCOSE: 252 mg/dL — AB (ref 65–99)
Globulin: 2.4 g/dL (calc) (ref 1.9–3.7)
Potassium: 4 mmol/L (ref 3.5–5.3)
Sodium: 139 mmol/L (ref 135–146)
TOTAL PROTEIN: 6.8 g/dL (ref 6.1–8.1)
Total Bilirubin: 0.6 mg/dL (ref 0.2–1.2)

## 2018-02-21 LAB — LIPID PANEL
Cholesterol: 153 mg/dL (ref <200)
HDL: 27 mg/dL — ABNORMAL LOW (ref >40)
LDL CHOLESTEROL (CALC): 92 mg/dL
Non-HDL Cholesterol (Calc): 126 mg/dL (calc) (ref <130)
TRIGLYCERIDES: 261 mg/dL — AB (ref <150)
Total CHOL/HDL Ratio: 5.7 (calc) — ABNORMAL HIGH (ref <5.0)

## 2018-02-21 LAB — MAGNESIUM: MAGNESIUM: 1.7 mg/dL (ref 1.5–2.5)

## 2018-02-21 LAB — HEMOGLOBIN A1C
Hgb A1c MFr Bld: 6.7 % of total Hgb — ABNORMAL HIGH (ref <5.7)
Mean Plasma Glucose: 146 (calc)
eAG (mmol/L): 8.1 (calc)

## 2018-02-21 LAB — TSH: TSH: 2.21 m[IU]/L (ref 0.40–4.50)

## 2018-02-25 ENCOUNTER — Encounter: Payer: Self-pay | Admitting: Adult Health

## 2018-03-08 ENCOUNTER — Other Ambulatory Visit: Payer: Self-pay | Admitting: Internal Medicine

## 2018-04-18 ENCOUNTER — Other Ambulatory Visit: Payer: Self-pay | Admitting: Internal Medicine

## 2018-04-18 DIAGNOSIS — N183 Chronic kidney disease, stage 3 unspecified: Secondary | ICD-10-CM

## 2018-04-18 DIAGNOSIS — J310 Chronic rhinitis: Secondary | ICD-10-CM

## 2018-04-18 DIAGNOSIS — K219 Gastro-esophageal reflux disease without esophagitis: Secondary | ICD-10-CM

## 2018-04-18 DIAGNOSIS — N3281 Overactive bladder: Secondary | ICD-10-CM

## 2018-04-18 DIAGNOSIS — I10 Essential (primary) hypertension: Secondary | ICD-10-CM

## 2018-04-18 DIAGNOSIS — E782 Mixed hyperlipidemia: Secondary | ICD-10-CM

## 2018-04-18 DIAGNOSIS — E1122 Type 2 diabetes mellitus with diabetic chronic kidney disease: Secondary | ICD-10-CM

## 2018-04-18 DIAGNOSIS — F419 Anxiety disorder, unspecified: Secondary | ICD-10-CM

## 2018-04-18 MED ORDER — FENOFIBRATE 160 MG PO TABS: ORAL_TABLET | ORAL | 1 refills | Status: DC

## 2018-04-18 MED ORDER — METFORMIN HCL ER 500 MG PO TB24: ORAL_TABLET | ORAL | 1 refills | Status: DC

## 2018-04-18 MED ORDER — OMEPRAZOLE 40 MG PO CPDR: DELAYED_RELEASE_CAPSULE | ORAL | 1 refills | Status: DC

## 2018-04-18 MED ORDER — ALPRAZOLAM 1 MG PO TABS: ORAL_TABLET | ORAL | 0 refills | Status: DC

## 2018-04-18 MED ORDER — OXYBUTYNIN CHLORIDE 5 MG PO TABS: ORAL_TABLET | ORAL | 1 refills | Status: DC

## 2018-04-18 MED ORDER — PRAVASTATIN SODIUM 40 MG PO TABS: ORAL_TABLET | ORAL | 1 refills | Status: DC

## 2018-04-18 MED ORDER — GLIPIZIDE 5 MG PO TABS: ORAL_TABLET | ORAL | 1 refills | Status: DC

## 2018-04-18 MED ORDER — FLUTICASONE PROPIONATE 50 MCG/ACT NA SUSP: NASAL | 1 refills | Status: DC

## 2018-04-18 MED ORDER — LOSARTAN POTASSIUM-HCTZ 100-25 MG PO TABS: ORAL_TABLET | ORAL | 1 refills | Status: DC

## 2018-05-15 DIAGNOSIS — H6123 Impacted cerumen, bilateral: Secondary | ICD-10-CM | POA: Diagnosis not present

## 2018-06-02 NOTE — Progress Notes (Signed)
This very nice 72 y.o. MWM presents for 6 month follow up with HTN, HLD, T2_NIDDM and Vitamin D Deficiency. Patient has OSA on CPAP with improved sleep hygiene.     Patient is treated for HTN (1992) & BP has been controlled at home. Today's BP is at goal -  114/84. Patient has had no complaints of any cardiac type chest pain, palpitations, dyspnea / orthopnea / PND, dizziness, claudication, or dependent edema.     Hyperlipidemia is controlled with diet & meds. Patient denies myalgias or other med SE's. Last Lipids were at goal albeit elevated Trig's: Lab Results  Component Value Date   CHOL 153 02/20/2018   HDL 27 (L) 02/20/2018   LDLCALC 92 02/20/2018   TRIG 261 (H) 02/20/2018   CHOLHDL 5.7 (H) 02/20/2018      Also, the patient has history of PreDM (2008) and then T2_NIDDM (2014) and has had no symptoms of reactive hypoglycemia, diabetic polys, paresthesias or visual blurring.  Patient has Morbid Obesity (BMI 32) and admits overeating and last A1c was not at goal: Lab Results  Component Value Date   HGBA1C 6.7 (H) 02/20/2018      Further, the patient also has history of Vitamin D Deficiency ("22"/2008) and supplements vitamin D without any suspected side-effects. Last vitamin D was near goal (70-100):  Lab Results  Component Value Date   VD25OH 56 10/23/2017   Current Outpatient Medications on File Prior to Visit  Medication Sig  . ALPRAZolam (XANAX) 1 MG tablet Take 1/2 to 1 tablet 2 to 3 x /day ONLY if needed for Anxiety Attack & please try to limit to 5 days /week to avoid addiction  . Cholecalciferol (VITAMIN D3) 10000 units capsule Take 10,000 Units by mouth daily.  . fenofibrate 160 MG tablet Take 1 tablet daily for Blood Fats  . fluticasone (FLONASE) 50 MCG/ACT nasal spray Use 2 sprays each nostril  /daily  . glipiZIDE (GLUCOTROL) 5 MG tablet Take 1 tablet 2 x /day with meals for Diabetes  . glucose blood (FREESTYLE LITE) test strip Check blood sugar 1 time  daily-DX-E11.22  . loratadine (CLARITIN) 10 MG tablet Take 10 mg by mouth daily as needed for allergies. OTC  . losartan-hydrochlorothiazide (HYZAAR) 100-25 MG tablet Take 1 tablet daily for BP  . metFORMIN (GLUCOPHAGE-XR) 500 MG 24 hr tablet Take 2 tablets 2 x /day with meals for Diabetes  . mupirocin ointment (BACTROBAN) 2 % APPLY TWICE A DAY TO SKIN INFECTION.  Marland Kitchen omeprazole (PRILOSEC) 40 MG capsule Take 1 capsule /daily for Acid Reflux  . oxybutynin (DITROPAN) 5 MG tablet Take 1 tablet 3 x /day for BladderControl  . pravastatin (PRAVACHOL) 40 MG tablet Take 1 tablet at Bedtime for Cholesterol  . ranitidine (ZANTAC) 150 MG tablet TAKE 1 TABLET(150 MG) BY MOUTH TWICE DAILY  . tamsulosin (FLOMAX) 0.4 MG CAPS capsule Take 1 capsule (0.4 mg total) by mouth daily.  Marland Kitchen aspirin 81 MG tablet Take 81 mg by mouth daily.   No current facility-administered medications on file prior to visit.    Allergies  Allergen Reactions  . Cardura [Doxazosin Mesylate] Other (See Comments)    Nasal congestion  . Codeine Other (See Comments)    Pt passes out.  Marland Kitchen Hytrin [Terazosin]    PMHx:   Past Medical History:  Diagnosis Date  . Gout   . Hyperlipidemia   . Hypertension   . OSA (obstructive sleep apnea)   . Pre-diabetes  Immunization History  Administered Date(s) Administered  . DT 06/01/2015  . Influenza Whole 06/05/2013  . Influenza, High Dose Seasonal PF 06/05/2014, 06/01/2015, 06/14/2016, 06/22/2017  . Pneumococcal Conjugate-13 08/19/2014  . Pneumococcal Polysaccharide-23 08/22/2003, 12/10/2015  . Tdap 03/08/2004  . Zoster 08/18/2013   Past Surgical History:  Procedure Laterality Date  . FOOT SURGERY  10/2009  . MYRINGOTOMY Left 02-2005   Dr.Newman  . TONSILLECTOMY  1953   FHx:    Reviewed / unchanged  SHx:    Reviewed / unchanged   Systems Review:  Constitutional: Denies fever, chills, wt changes, headaches, insomnia, fatigue, night sweats, change in appetite. Eyes: Denies  redness, blurred vision, diplopia, discharge, itchy, watery eyes.  ENT: Denies discharge, congestion, post nasal drip, epistaxis, sore throat, earache, hearing loss, dental pain, tinnitus, vertigo, sinus pain, snoring.  CV: Denies chest pain, palpitations, irregular heartbeat, syncope, dyspnea, diaphoresis, orthopnea, PND, claudication or edema. Respiratory: denies cough, dyspnea, DOE, pleurisy, hoarseness, laryngitis, wheezing.  Gastrointestinal: Denies dysphagia, odynophagia, heartburn, reflux, water brash, abdominal pain or cramps, nausea, vomiting, bloating, diarrhea, constipation, hematemesis, melena, hematochezia  or hemorrhoids. Genitourinary: Denies dysuria, frequency, urgency, nocturia, hesitancy, discharge, hematuria or flank pain. Musculoskeletal: Denies arthralgias, myalgias, stiffness, jt. swelling, pain, limping or strain/sprain.  Skin: Denies pruritus, rash, hives, warts, acne, eczema or change in skin lesion(s). Neuro: No weakness, tremor, incoordination, spasms, paresthesia or pain. Psychiatric: Denies confusion, memory loss or sensory loss. Endo: Denies change in weight, skin or hair change.  Heme/Lymph: No excessive bleeding, bruising or enlarged lymph nodes.  Physical Exam  BP 114/84   Pulse 72   Temp 97.8 F (36.6 C)   Resp 16   Ht 5' 10.5" (1.791 m)   Wt 225 lb 12.8 oz (102.4 kg)   BMI 31.94 kg/m   Appears  well nourished, well groomed  and in no distress.  Eyes: PERRLA, EOMs, conjunctiva no swelling or erythema. Sinuses: No frontal/maxillary tenderness ENT/Mouth: EAC's clear, TM's nl w/o erythema, bulging. Nares clear w/o erythema, swelling, exudates. Oropharynx clear without erythema or exudates. Oral hygiene is good. Tongue normal, non obstructing. Hearing intact.  Neck: Supple. Thyroid not palpable. Car 2+/2+ without bruits, nodes or JVD. Chest: Respirations nl with BS clear & equal w/o rales, rhonchi, wheezing or stridor.  Cor: Heart sounds normal w/ regular  rate and rhythm without sig. murmurs, gallops, clicks or rubs. Peripheral pulses normal and equal  without edema.  Abdomen: Soft & bowel sounds normal. Non-tender w/o guarding, rebound, hernias, masses or organomegaly.  Lymphatics: Unremarkable.  Musculoskeletal: Full ROM all peripheral extremities, joint stability, 5/5 strength and normal gait.  Skin: Warm, dry without exposed rashes, lesions or ecchymosis apparent.  Neuro: Cranial nerves intact, reflexes equal bilaterally. Sensory-motor testing grossly intact. Tendon reflexes grossly intact.  Pysch: Alert & oriented x 3.  Insight and judgement nl & appropriate. No ideations.  Assessment and Plan:  1. Essential hypertension  - Continue medication, monitor blood pressure at home.  - Continue DASH diet.  Reminder to go to the ER if any CP,  SOB, nausea, dizziness, severe HA, changes vision/speech.  - CBC with Differential/Platelet - COMPLETE METABOLIC PANEL WITH GFR - Magnesium - TSH  2. Hyperlipidemia, mixed  - Continue diet/meds, exercise,& lifestyle modifications.  - Continue monitor periodic cholesterol/liver & renal functions   - Lipid panel - TSH  3. Type 2 diabetes mellitus with stage 3 chronic kidney disease, without long-term current use of insulin ([facility])  - Continue diet, exercise,  - lifestyle modifications.  -  Monitor appropriate labs.  - Hemoglobin A1c - Insulin, random  4. Vitamin D deficiency  - Continue supplementation.  - VITAMIN D 25 Hydroxyl  5. Gastroesophageal reflux disease  - CBC with Differential/Platelet  6. Medication management  - CBC with Differential/Platelet - COMPLETE METABOLIC PANEL WITH GFR - Magnesium - Lipid panel - TSH - Hemoglobin A1c - Insulin, random - VITAMIN D 25 Hydroxyl       Discussed  regular exercise, BP monitoring, weight control to achieve/maintain BMI less than 25 and discussed med and SE's. Recommended labs to assess and monitor clinical status with further  disposition pending results of labs. Over 30 minutes of exam, counseling, chart review was performed.

## 2018-06-02 NOTE — Patient Instructions (Signed)

## 2018-06-03 ENCOUNTER — Ambulatory Visit (INDEPENDENT_AMBULATORY_CARE_PROVIDER_SITE_OTHER): Payer: Medicare Other | Admitting: Internal Medicine

## 2018-06-03 ENCOUNTER — Encounter: Payer: Self-pay | Admitting: Internal Medicine

## 2018-06-03 VITALS — BP 114/84 | HR 72 | Temp 97.8°F | Resp 16 | Ht 70.5 in | Wt 225.8 lb

## 2018-06-03 DIAGNOSIS — N183 Chronic kidney disease, stage 3 unspecified: Secondary | ICD-10-CM

## 2018-06-03 DIAGNOSIS — Z79899 Other long term (current) drug therapy: Secondary | ICD-10-CM

## 2018-06-03 DIAGNOSIS — E559 Vitamin D deficiency, unspecified: Secondary | ICD-10-CM

## 2018-06-03 DIAGNOSIS — K219 Gastro-esophageal reflux disease without esophagitis: Secondary | ICD-10-CM

## 2018-06-03 DIAGNOSIS — E782 Mixed hyperlipidemia: Secondary | ICD-10-CM | POA: Diagnosis not present

## 2018-06-03 DIAGNOSIS — E6609 Other obesity due to excess calories: Secondary | ICD-10-CM

## 2018-06-03 DIAGNOSIS — I10 Essential (primary) hypertension: Secondary | ICD-10-CM | POA: Diagnosis not present

## 2018-06-03 DIAGNOSIS — E1122 Type 2 diabetes mellitus with diabetic chronic kidney disease: Secondary | ICD-10-CM | POA: Diagnosis not present

## 2018-06-03 DIAGNOSIS — Z6832 Body mass index (BMI) 32.0-32.9, adult: Secondary | ICD-10-CM

## 2018-06-03 MED ORDER — PHENTERMINE HCL 37.5 MG PO TABS: ORAL_TABLET | ORAL | 5 refills | Status: DC

## 2018-06-03 MED ORDER — TOPIRAMATE 50 MG PO TABS: ORAL_TABLET | ORAL | 1 refills | Status: DC

## 2018-06-03 MED ORDER — VALSARTAN-HYDROCHLOROTHIAZIDE 320-25 MG PO TABS: ORAL_TABLET | ORAL | 1 refills | Status: DC

## 2018-06-04 LAB — CBC WITH DIFFERENTIAL/PLATELET
BASOS ABS: 48 {cells}/uL (ref 0–200)
Basophils Relative: 0.7 %
EOS ABS: 283 {cells}/uL (ref 15–500)
EOS PCT: 4.1 %
HEMATOCRIT: 40.1 % (ref 38.5–50.0)
Hemoglobin: 13.6 g/dL (ref 13.2–17.1)
LYMPHS ABS: 1580 {cells}/uL (ref 850–3900)
MCH: 29.3 pg (ref 27.0–33.0)
MCHC: 33.9 g/dL (ref 32.0–36.0)
MCV: 86.4 fL (ref 80.0–100.0)
MONOS PCT: 8.7 %
MPV: 10.2 fL (ref 7.5–12.5)
NEUTROS ABS: 4388 {cells}/uL (ref 1500–7800)
Neutrophils Relative %: 63.6 %
PLATELETS: 195 10*3/uL (ref 140–400)
RBC: 4.64 10*6/uL (ref 4.20–5.80)
RDW: 13.2 % (ref 11.0–15.0)
Total Lymphocyte: 22.9 %
WBC mixed population: 600 cells/uL (ref 200–950)
WBC: 6.9 10*3/uL (ref 3.8–10.8)

## 2018-06-04 LAB — MAGNESIUM: Magnesium: 1.6 mg/dL (ref 1.5–2.5)

## 2018-06-04 LAB — COMPLETE METABOLIC PANEL WITH GFR
AG Ratio: 1.9 (calc) (ref 1.0–2.5)
ALBUMIN MSPROF: 4.4 g/dL (ref 3.6–5.1)
ALT: 13 U/L (ref 9–46)
AST: 17 U/L (ref 10–35)
Alkaline phosphatase (APISO): 39 U/L — ABNORMAL LOW (ref 40–115)
BILIRUBIN TOTAL: 0.5 mg/dL (ref 0.2–1.2)
BUN / CREAT RATIO: 14 (calc) (ref 6–22)
BUN: 19 mg/dL (ref 7–25)
CHLORIDE: 106 mmol/L (ref 98–110)
CO2: 28 mmol/L (ref 20–32)
Calcium: 9.7 mg/dL (ref 8.6–10.3)
Creat: 1.32 mg/dL — ABNORMAL HIGH (ref 0.70–1.18)
GFR, EST AFRICAN AMERICAN: 63 mL/min/{1.73_m2} (ref >=60)
GFR, Est Non African American: 54 mL/min/{1.73_m2} — ABNORMAL LOW (ref >=60)
GLOBULIN: 2.3 g/dL (ref 1.9–3.7)
Glucose, Bld: 162 mg/dL — ABNORMAL HIGH (ref 65–99)
POTASSIUM: 4.3 mmol/L (ref 3.5–5.3)
SODIUM: 143 mmol/L (ref 135–146)
TOTAL PROTEIN: 6.7 g/dL (ref 6.1–8.1)

## 2018-06-04 LAB — LIPID PANEL
Cholesterol: 176 mg/dL (ref <200)
HDL: 27 mg/dL — ABNORMAL LOW (ref >40)
LDL Cholesterol (Calc): 115 mg/dL (calc) — ABNORMAL HIGH
NON-HDL CHOLESTEROL (CALC): 149 mg/dL — AB (ref <130)
Total CHOL/HDL Ratio: 6.5 (calc) — ABNORMAL HIGH (ref <5.0)
Triglycerides: 224 mg/dL — ABNORMAL HIGH (ref <150)

## 2018-06-04 LAB — TSH: TSH: 2.38 mIU/L (ref 0.40–4.50)

## 2018-06-04 LAB — HEMOGLOBIN A1C
EAG (MMOL/L): 6.8 (calc)
HEMOGLOBIN A1C: 5.9 %{Hb} — AB (ref <5.7)
Mean Plasma Glucose: 123 (calc)

## 2018-06-04 LAB — INSULIN, RANDOM: Insulin: 5.6 u[IU]/mL (ref 2.0–19.6)

## 2018-06-04 LAB — VITAMIN D 25 HYDROXY (VIT D DEFICIENCY, FRACTURES): Vit D, 25-Hydroxy: 41 ng/mL (ref 30–100)

## 2018-06-10 ENCOUNTER — Ambulatory Visit: Payer: Self-pay

## 2018-06-11 ENCOUNTER — Ambulatory Visit (INDEPENDENT_AMBULATORY_CARE_PROVIDER_SITE_OTHER): Payer: Medicare Other

## 2018-06-11 VITALS — Temp 97.8°F | Ht 70.5 in | Wt 227.0 lb

## 2018-06-11 DIAGNOSIS — Z23 Encounter for immunization: Secondary | ICD-10-CM | POA: Diagnosis not present

## 2018-06-11 NOTE — Progress Notes (Signed)
Pt reports for HD influenza. Temp:97.8

## 2018-08-08 ENCOUNTER — Other Ambulatory Visit: Payer: Self-pay | Admitting: Internal Medicine

## 2018-08-08 DIAGNOSIS — E1122 Type 2 diabetes mellitus with diabetic chronic kidney disease: Secondary | ICD-10-CM

## 2018-08-08 DIAGNOSIS — N183 Chronic kidney disease, stage 3 unspecified: Secondary | ICD-10-CM

## 2018-08-15 ENCOUNTER — Other Ambulatory Visit: Payer: Self-pay | Admitting: Internal Medicine

## 2018-08-15 MED ORDER — AZITHROMYCIN 250 MG PO TABS: ORAL_TABLET | ORAL | 0 refills | Status: DC

## 2018-08-15 MED ORDER — PREDNISONE 20 MG PO TABS: ORAL_TABLET | ORAL | 0 refills | Status: DC

## 2018-08-21 HISTORY — PX: COLONOSCOPY: SHX174

## 2018-08-23 ENCOUNTER — Other Ambulatory Visit: Payer: Self-pay | Admitting: Internal Medicine

## 2018-08-23 DIAGNOSIS — F419 Anxiety disorder, unspecified: Secondary | ICD-10-CM

## 2018-09-09 NOTE — Progress Notes (Signed)
FOLLOW UP  Assessment and Plan:   Hypertension Well controlled with current medications  Monitor blood pressure at home; patient to call if consistently greater than 130/80 Continue DASH diet.   Reminder to go to the ER if any CP, SOB, nausea, dizziness, severe HA, changes vision/speech, left arm numbness and tingling and jaw pain.  Cholesterol Continue medications Continue low cholesterol diet and exercise.  Check lipid panel.   Diabetes with diabetic chronic kidney disease Continue diet and exercise.  Perform daily foot/skin check, notify office of any concerning changes.  Check A1C  Vitamin D Def/ osteoporosis prevention Continue supplementation Check Vit D level  GERD Symptoms well managed without breakthrough Will try to get off PPI given info for taper and zantac sent in   Continue diet and meds as discussed. Further disposition pending results of labs. Discussed med's effects and SE's.   Over 30 minutes of exam, counseling, chart review, and critical decision making was performed.   Future Appointments  Date Time Provider [place]  12/12/2018 11:00 AM Unk Pinto, MD [facility] None    ----------------------------------------------------------------------------------------------------------------------  HPI 73 y.o. male  presents for 3 month follow up on hypertension, cholesterol, diabetes, GERD, gout, obesity and vitamin D deficiency.   BMI is Body mass index is 32.05 kg/m., he has not been working on diet and exercise. He is on phentermine and topamax. Wt Readings from Last 3 Encounters:  09/12/18 226 lb 9.6 oz (102.8 kg)  06/11/18 227 lb (103 kg)  06/03/18 225 lb 12.8 oz (102.4 kg)   he has a diagnosis of GERD which is currently managed by omeprazole 40 mg daily he reports symptoms is currently well controlled, and denies breakthrough reflux, burning in chest, hoarseness or cough.    He has had sinus issues x Xmas, on claritin, mucinex, and  still has nasal issues but no fever, chills.   His blood pressure has been controlled at home, today their BP is BP: 120/66  He does not workout. He denies chest pain, shortness of breath, dizziness.   He is on cholesterol medication, he is on pravastatin and fenofibrate and denies myalgias. His cholesterol is not at goal. The cholesterol last visit was:   Lab Results  Component Value Date   CHOL 176 06/03/2018   HDL 27 (L) 06/03/2018   LDLCALC 115 (H) 06/03/2018   TRIG 224 (H) 06/03/2018   CHOLHDL 6.5 (H) 06/03/2018    He has not been working on diet and exercise for diabetes, and denies increased appetite, nausea, paresthesia of the feet, polydipsia, polyuria and visual disturbances he endorses hyperglycemia. Last A1C in the office was:  Lab Results  Component Value Date   HGBA1C 5.9 (H) 06/03/2018   Patient is on high dose Vitamin D supplement but remains below goal:    Lab Results  Component Value Date   VD25OH 41 06/03/2018       Current Medications:  Current Outpatient Medications on File Prior to Visit  Medication Sig  . ALPRAZolam (XANAX) 1 MG tablet Take 1/2-1 tablet 2 - 3 x /day ONLY if needed for Anxiety Attack &  limit to 5 days /week to avoid addiction  . aspirin 81 MG tablet Take 81 mg by mouth daily.  . Cholecalciferol (VITAMIN D3) 10000 units capsule Take 10,000 Units by mouth daily.  . fenofibrate 160 MG tablet Take 1 tablet daily for Blood Fats  . fluticasone (FLONASE) 50 MCG/ACT nasal spray Use 2 sprays each nostril  /daily  . glipiZIDE (  GLUCOTROL) 5 MG tablet TAKE 1 TABLET BY MOUTH  TWICE A DAY WITH MEALS FOR  DIABETES  . glucose blood (FREESTYLE LITE) test strip Check blood sugar 1 time daily-[id number]  . metFORMIN (GLUCOPHAGE-XR) 500 MG 24 hr tablet Take 2 tablets 2 x /day with meals for Diabetes  . mupirocin ointment (BACTROBAN) 2 % APPLY TWICE A DAY TO SKIN INFECTION.  Marland Kitchen omeprazole (PRILOSEC) 40 MG capsule Take 1 capsule /daily for Acid Reflux  .  oxybutynin (DITROPAN) 5 MG tablet Take 1 tablet 3 x /day for BladderControl  . ranitidine (ZANTAC) 150 MG tablet TAKE 1 TABLET(150 MG) BY MOUTH TWICE DAILY  . tamsulosin (FLOMAX) 0.4 MG CAPS capsule Take 1 capsule (0.4 mg total) by mouth daily.  . valsartan-hydrochlorothiazide (DIOVAN-HCT) 320-25 MG tablet Take 1 tablet every morning for BP   No current facility-administered medications on file prior to visit.      Allergies:  Allergies  Allergen Reactions  . Cardura [Doxazosin Mesylate] Other (See Comments)    Nasal congestion  . Codeine Other (See Comments)    Pt passes out.  Marland Kitchen Hytrin [Terazosin]      Medical History:  Past Medical History:  Diagnosis Date  . Gout   . Hyperlipidemia   . Hypertension   . OSA (obstructive sleep apnea)   . Pre-diabetes    Family history- Reviewed and unchanged Social history- Reviewed and unchanged   Review of Systems:  Review of Systems  Constitutional: Negative for malaise/fatigue and weight loss.  HENT: Negative for hearing loss and tinnitus.   Eyes: Negative for blurred vision and double vision.  Respiratory: Negative for cough, sputum production, shortness of breath and wheezing.   Cardiovascular: Negative for chest pain, palpitations, orthopnea, claudication and leg swelling.  Gastrointestinal: Negative for abdominal pain, blood in stool, constipation, diarrhea, heartburn, melena, nausea and vomiting.  Genitourinary: Negative.  Negative for frequency.  Musculoskeletal: Negative for falls, joint pain and myalgias.  Skin: Negative for rash.  Neurological: Negative for dizziness, tingling, sensory change, weakness and headaches.  Endo/Heme/Allergies: Negative for polydipsia.  Psychiatric/Behavioral: Negative.   All other systems reviewed and are negative.     Physical Exam: BP 120/66   Pulse 81   Temp 97.8 F (36.6 C)   Ht 5' 10.5" (1.791 m)   Wt 226 lb 9.6 oz (102.8 kg)   SpO2 99%   BMI 32.05 kg/m  Wt Readings from Last  3 Encounters:  09/12/18 226 lb 9.6 oz (102.8 kg)  06/11/18 227 lb (103 kg)  06/03/18 225 lb 12.8 oz (102.4 kg)   General Appearance: Well nourished, in no apparent distress. Eyes: PERRLA, EOMs, conjunctiva no swelling or erythema Sinuses: No Frontal/maxillary tenderness ENT/Mouth: Ext aud canals clear, TMs without erythema, bulging. No erythema, swelling, or exudate on post pharynx.  Tonsils not swollen or erythematous. Hearing normal.  Neck: Supple, thyroid normal.  Respiratory: Respiratory effort normal, BS equal bilaterally without rales, rhonchi, wheezing or stridor.  Cardio: RRR with no MRGs. Brisk peripheral pulses without edema.  Abdomen: Soft, + BS.  Non tender, no guarding, rebound, hernias, masses. Lymphatics: Non tender without lymphadenopathy.  Musculoskeletal: Full ROM, 5/5 strength, Normal gait Skin: Warm, dry without rashes, lesions, ecchymosis.  Neuro: Cranial nerves intact. No cerebellar symptoms.  Psych: Awake and oriented X 3, normal affect, Insight and Judgment appropriate.    Vicie Mutters, PA-C 11:03 AM [facility]

## 2018-09-10 DIAGNOSIS — E119 Type 2 diabetes mellitus without complications: Secondary | ICD-10-CM | POA: Diagnosis not present

## 2018-09-10 LAB — HM DIABETES EYE EXAM

## 2018-09-12 ENCOUNTER — Encounter: Payer: Self-pay | Admitting: Physician Assistant

## 2018-09-12 ENCOUNTER — Ambulatory Visit (INDEPENDENT_AMBULATORY_CARE_PROVIDER_SITE_OTHER): Payer: Medicare Other | Admitting: Physician Assistant

## 2018-09-12 VITALS — BP 120/66 | HR 81 | Temp 97.8°F | Ht 70.5 in | Wt 226.6 lb

## 2018-09-12 DIAGNOSIS — N183 Chronic kidney disease, stage 3 (moderate): Secondary | ICD-10-CM | POA: Diagnosis not present

## 2018-09-12 DIAGNOSIS — Z79899 Other long term (current) drug therapy: Secondary | ICD-10-CM | POA: Diagnosis not present

## 2018-09-12 DIAGNOSIS — I10 Essential (primary) hypertension: Secondary | ICD-10-CM

## 2018-09-12 DIAGNOSIS — E6609 Other obesity due to excess calories: Secondary | ICD-10-CM | POA: Diagnosis not present

## 2018-09-12 DIAGNOSIS — E782 Mixed hyperlipidemia: Secondary | ICD-10-CM

## 2018-09-12 DIAGNOSIS — E1122 Type 2 diabetes mellitus with diabetic chronic kidney disease: Secondary | ICD-10-CM

## 2018-09-12 DIAGNOSIS — Z6832 Body mass index (BMI) 32.0-32.9, adult: Secondary | ICD-10-CM | POA: Diagnosis not present

## 2018-09-12 MED ORDER — ROSUVASTATIN CALCIUM 10 MG PO TABS: 10.0000 mg | ORAL_TABLET | Freq: Every day | ORAL | 3 refills | Status: DC

## 2018-09-12 NOTE — Patient Instructions (Addendum)
Schedule for a colonoscopy  Try sphagetti squash and zucchini noodles  We are starting you on Metformin to prevent or treat diabetes.  Metformin does NOT cause low blood sugars.   In order to create energy your cells need insulin and sugar but sometime your cells do not accept the insulin and this can cause increased sugars and decreased energy.   The Metformin helps your cells accept insulin and the sugar this help: 1) increase your energy  2) weight loss.    The two most common side effects are nausea and diarrhea, follow these rules to avoid it but these symptoms get better with time on the medication.    ALSO You can take imodium per box instructions when starting metformin if needed.   Rules of metformin: 1) start out slow with only one pill daily. Our goal for you is 4 pills a day or 2000mg  total.  2) take with your largest meal. 3) Take with least amount of carbs.   Call if you have any problems.    Google mindful eating and here are some tips and tricks below.   Rate your hunger before you eat on a scale of 1-10, try to eat closer to a 6 or higher. And if you are at below that, why are you eating? Slow down and listen to your body.            When it comes to diets, agreement about the perfect plan isn't easy to find, even among the experts. Experts at the [employer] developed an idea known as the Healthy Eating Plate. Just imagine a plate divided into logical, healthy portions.  The emphasis is on diet quality:  Load up on vegetables and fruits - one-half of your plate: Aim for color and variety, and remember that potatoes don't count.  Go for whole grains - one-quarter of your plate: Whole wheat, barley, wheat berries, quinoa, oats, brown rice, and foods made with them. If you want pasta, go with whole wheat pasta.  Protein power - one-quarter of your plate: Fish, chicken, beans, and nuts are all healthy, versatile protein sources. Limit  red meat.  The diet, however, does go beyond the plate, offering a few other suggestions.  Use healthy plant oils, such as olive, canola, soy, corn, sunflower and peanut. Check the labels, and avoid partially hydrogenated oil, which have unhealthy trans fats.  If you're thirsty, drink water. Coffee and tea are good in moderation, but skip sugary drinks and limit milk and dairy products to one or two daily servings.  The type of carbohydrate in the diet is more important than the amount. Some sources of carbohydrates, such as vegetables, fruits, whole grains, and beans-are healthier than others.  Finally, stay active.

## 2018-09-13 LAB — COMPLETE METABOLIC PANEL WITH GFR
AG Ratio: 1.9 (calc) (ref 1.0–2.5)
ALT: 16 U/L (ref 9–46)
AST: 20 U/L (ref 10–35)
Albumin: 4.7 g/dL (ref 3.6–5.1)
Alkaline phosphatase (APISO): 51 U/L (ref 40–115)
BUN/Creatinine Ratio: 14 (calc) (ref 6–22)
BUN: 21 mg/dL (ref 7–25)
CALCIUM: 10 mg/dL (ref 8.6–10.3)
CO2: 28 mmol/L (ref 20–32)
Chloride: 106 mmol/L (ref 98–110)
Creat: 1.55 mg/dL — ABNORMAL HIGH (ref 0.70–1.18)
GFR, EST NON AFRICAN AMERICAN: 44 mL/min/{1.73_m2} — AB (ref >=60)
GFR, Est African American: 51 mL/min/{1.73_m2} — ABNORMAL LOW (ref >=60)
Globulin: 2.5 g/dL (calc) (ref 1.9–3.7)
Glucose, Bld: 140 mg/dL — ABNORMAL HIGH (ref 65–99)
Potassium: 4.4 mmol/L (ref 3.5–5.3)
Sodium: 144 mmol/L (ref 135–146)
Total Bilirubin: 0.5 mg/dL (ref 0.2–1.2)
Total Protein: 7.2 g/dL (ref 6.1–8.1)

## 2018-09-13 LAB — LIPID PANEL
Cholesterol: 216 mg/dL — ABNORMAL HIGH (ref <200)
HDL: 28 mg/dL — ABNORMAL LOW (ref >40)
LDL Cholesterol (Calc): 143 mg/dL (calc) — ABNORMAL HIGH
Non-HDL Cholesterol (Calc): 188 mg/dL (calc) — ABNORMAL HIGH (ref <130)
Total CHOL/HDL Ratio: 7.7 (calc) — ABNORMAL HIGH (ref <5.0)
Triglycerides: 313 mg/dL — ABNORMAL HIGH (ref <150)

## 2018-09-13 LAB — CBC WITH DIFFERENTIAL/PLATELET
Absolute Monocytes: 624 cells/uL (ref 200–950)
Basophils Absolute: 47 cells/uL (ref 0–200)
Basophils Relative: 0.6 %
Eosinophils Absolute: 530 cells/uL — ABNORMAL HIGH (ref 15–500)
Eosinophils Relative: 6.8 %
HCT: 42.6 % (ref 38.5–50.0)
Hemoglobin: 14.6 g/dL (ref 13.2–17.1)
Lymphs Abs: 1942 cells/uL (ref 850–3900)
MCH: 29.4 pg (ref 27.0–33.0)
MCHC: 34.3 g/dL (ref 32.0–36.0)
MCV: 85.9 fL (ref 80.0–100.0)
MPV: 10.4 fL (ref 7.5–12.5)
Monocytes Relative: 8 %
Neutro Abs: 4657 cells/uL (ref 1500–7800)
Neutrophils Relative %: 59.7 %
Platelets: 208 10*3/uL (ref 140–400)
RBC: 4.96 10*6/uL (ref 4.20–5.80)
RDW: 13.5 % (ref 11.0–15.0)
Total Lymphocyte: 24.9 %
WBC: 7.8 10*3/uL (ref 3.8–10.8)

## 2018-09-13 LAB — HEMOGLOBIN A1C
Hgb A1c MFr Bld: 7.8 % of total Hgb — ABNORMAL HIGH (ref <5.7)
Mean Plasma Glucose: 177 (calc)
eAG (mmol/L): 9.8 (calc)

## 2018-09-13 LAB — MAGNESIUM: Magnesium: 1.7 mg/dL (ref 1.5–2.5)

## 2018-09-13 LAB — TSH: TSH: 2.21 mIU/L (ref 0.40–4.50)

## 2018-09-23 ENCOUNTER — Telehealth: Payer: Self-pay

## 2018-09-23 NOTE — Telephone Encounter (Signed)
Pt wanted to know if he is supposed to be taking GLIPIZIDE (4 tablet) daily in addition to his metformin?  Per provider: No just glipizide 1 pill twice daily with meals.  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

## 2018-10-08 ENCOUNTER — Encounter: Payer: Self-pay | Admitting: *Deleted

## 2018-10-11 ENCOUNTER — Other Ambulatory Visit: Payer: Self-pay | Admitting: Internal Medicine

## 2018-10-11 DIAGNOSIS — F419 Anxiety disorder, unspecified: Secondary | ICD-10-CM

## 2018-10-12 ENCOUNTER — Other Ambulatory Visit: Payer: Self-pay | Admitting: Internal Medicine

## 2018-10-12 DIAGNOSIS — F419 Anxiety disorder, unspecified: Secondary | ICD-10-CM

## 2018-10-12 MED ORDER — ALPRAZOLAM 1 MG PO TABS: ORAL_TABLET | ORAL | 0 refills | Status: DC

## 2018-11-12 DIAGNOSIS — H6123 Impacted cerumen, bilateral: Secondary | ICD-10-CM | POA: Diagnosis not present

## 2018-11-12 DIAGNOSIS — H903 Sensorineural hearing loss, bilateral: Secondary | ICD-10-CM | POA: Diagnosis not present

## 2018-11-20 ENCOUNTER — Encounter: Payer: Self-pay | Admitting: Internal Medicine

## 2018-12-09 ENCOUNTER — Other Ambulatory Visit: Payer: Self-pay | Admitting: Adult Health

## 2018-12-09 DIAGNOSIS — E1122 Type 2 diabetes mellitus with diabetic chronic kidney disease: Secondary | ICD-10-CM

## 2018-12-09 DIAGNOSIS — N183 Chronic kidney disease, stage 3 unspecified: Secondary | ICD-10-CM

## 2018-12-11 ENCOUNTER — Encounter: Payer: Self-pay | Admitting: Internal Medicine

## 2018-12-11 NOTE — Patient Instructions (Signed)
Coronavirus (COVID-19) Are you at risk?  Are you at risk for the Coronavirus (COVID-19)?  To be considered HIGH RISK for Coronavirus (COVID-19), you have to meet the following criteria:  . Traveled to [place], [place], [place], [place] or [place]; or in the [place] to [place], [place], [place]  . or [place]; and have fever, cough, and shortness of breath within the last 2 weeks of travel OR . Been in close contact with a person diagnosed with COVID-19 within the last 2 weeks and have  . fever, cough,and shortness of breath .  . IF YOU DO NOT MEET THESE CRITERIA, YOU ARE CONSIDERED LOW RISK FOR COVID-19.  What to do if you are HIGH RISK for COVID-19?  . If you are having a medical emergency, call 911. . Seek medical care right away. Before you go to a doctor's office, urgent care or emergency department, .  call ahead and tell them about your recent travel, contact with someone diagnosed with COVID-19  .  and your symptoms.  . You should receive instructions from your physician's office regarding next steps of care.  . When you arrive at healthcare provider, tell the healthcare staff immediately you have returned from  . visiting [place], [place], [place], [place] or [place]; or traveled in the [place] to [place], [place],  . [place] or [place] in the last two weeks or you have been in close contact with a person diagnosed with  . COVID-19 in the last 2 weeks.   . Tell the health care staff about your symptoms: fever, cough and shortness of breath. . After you have been seen by a medical provider, you will be either: o Tested for (COVID-19) and discharged home on quarantine except to seek medical care if  o symptoms worsen, and asked to  - Stay home and avoid contact with others until you get your results (4-5 days)  - Avoid travel on public transportation if possible (such as bus, train, or airplane) or o Sent to the Emergency Department by EMS for evaluation,  COVID-19 testing  and  o possible admission depending on your condition and test results.  What to do if you are LOW RISK for COVID-19?  Reduce your risk of any infection by using the same precautions used for avoiding the common cold or flu:  . Wash your hands often with soap and warm water for at least 20 seconds.  If soap and water are not readily available,  . use an alcohol-based hand sanitizer with at least 60% alcohol.  . If coughing or sneezing, cover your mouth and nose by coughing or sneezing into the elbow areas of your shirt or coat, .  into a tissue or into your sleeve (not your hands). . Avoid shaking hands with others and consider head nods or verbal greetings only. . Avoid touching your eyes, nose, or mouth with unwashed hands.  . Avoid close contact with people who are sick. . Avoid places or events with large numbers of people in one location, like concerts or sporting events. . Carefully consider travel plans you have or are making. . If you are planning any travel outside or inside the [place], visit the CDC's Travelers' Health webpage for the latest health notices. . If you have some symptoms but not all symptoms, continue to monitor at home and seek medical attention  . if your symptoms worsen. . If you are having a medical emergency, call 911. >>>>>>>>>>>>>>>>>>>>>>>>>>>>>>>>>>>>>>>>>>>>>>>>>>>>>>>   We Do NOT Approve of  [facility] [place] Soliciting Our Patients  To Do Home Visits  & We Do NOT Approve of [employer] > > > > > > > > > > > > > > > > > > > > > > > > > > > > > > > > > > >  > > > >   Preventive Care for Adults  A healthy lifestyle and preventive care can promote health and wellness. Preventive health guidelines for men include the following key practices:  A routine yearly physical is a good way to check with your health care provider about your health and preventative screening. It is a chance to share any concerns and updates on  your health and to receive a thorough exam.  Visit your dentist for a routine exam and preventative care every 6 months. Brush your teeth twice a day and floss once a day. Good oral hygiene prevents tooth decay and gum disease.  The frequency of eye exams is based on your age, health, family medical history, use of contact lenses, and other factors. Follow your health care provider's recommendations for frequency of eye exams.  Eat a healthy diet. Foods such as vegetables, fruits, whole grains, low-fat dairy products, and lean protein foods contain the nutrients you need without too many calories. Decrease your intake of foods high in solid fats, added sugars, and salt. Eat the right amount of calories for you. Get information about a proper diet from your health care provider, if necessary.  Regular physical exercise is one of the most important things you can do for your health. Most adults should get at least 150 minutes of moderate-intensity exercise (any activity that increases your heart rate and causes you to sweat) each week. In addition, most adults need muscle-strengthening exercises on 2 or more days a week.  Maintain a healthy weight. The body mass index (BMI) is a screening tool to identify possible weight problems. It provides an estimate of body fat based on height and weight. Your health care provider can find your BMI and can help you achieve or maintain a healthy weight. For adults 20 years and older:  A BMI below 18.5 is considered underweight.  A BMI of 18.5 to 24.9 is normal.  A BMI of 25 to 29.9 is considered overweight.  A BMI of 30 and above is considered obese.  Maintain normal blood lipids and cholesterol levels by exercising and minimizing your intake of saturated fat. Eat a balanced diet with plenty of fruit and vegetables. Blood tests for lipids and cholesterol should begin at age 20 and be repeated every 5 years. If your lipid or cholesterol levels are high, you are  over 50, or you are at high risk for heart disease, you may need your cholesterol levels checked more frequently. Ongoing high lipid and cholesterol levels should be treated with medicines if diet and exercise are not working.  If you smoke, find out from your health care provider how to quit. If you do not use tobacco, do not start.  Lung cancer screening is recommended for adults aged 55-80 years who are at high risk for developing lung cancer because of a history of smoking. A yearly low-dose CT scan of the lungs is recommended for people who have at least a 30-pack-year history of smoking and are a current smoker or have quit within the past 15 years. A pack year of smoking is smoking an average of 1   pack of cigarettes a day for 1 year (for example: 1 pack a day for 30 years or 2 packs a day for 15 years). Yearly screening should continue until the smoker has stopped smoking for at least 15 years. Yearly screening should be stopped for people who develop a health problem that would prevent them from having lung cancer treatment.  If you choose to drink alcohol, do not have more than 2 drinks per day. One drink is considered to be 12 ounces (355 mL) of beer, 5 ounces (148 mL) of wine, or 1.5 ounces (44 mL) of liquor.  Avoid use of street drugs. Do not share needles with anyone. Ask for help if you need support or instructions about stopping the use of drugs.  High blood pressure causes heart disease and increases the risk of stroke. Your blood pressure should be checked at least every 1-2 years. Ongoing high blood pressure should be treated with medicines, if weight loss and exercise are not effective.  If you are 45-79 years old, ask your health care provider if you should take aspirin to prevent heart disease.  Diabetes screening involves taking a blood sample to check your fasting blood sugar level. Testing should be considered at a younger age or be carried out more frequently if you are  overweight and have at least 1 risk factor for diabetes.  Colorectal cancer can be detected and often prevented. Most routine colorectal cancer screening begins at the age of 50 and continues through age 75. However, your health care provider may recommend screening at an earlier age if you have risk factors for colon cancer. On a yearly basis, your health care provider may provide home test kits to check for hidden blood in the stool. Use of a small camera at the end of a tube to directly examine the colon (sigmoidoscopy or colonoscopy) can detect the earliest forms of colorectal cancer. Talk to your health care provider about this at age 50, when routine screening begins. Direct exam of the colon should be repeated every 5-10 years through age 75, unless early forms of precancerous polyps or small growths are found.  Hepatitis C blood testing is recommended for all people born from 1945 through 1965 and any individual with known risks for hepatitis C.  Screening for abdominal aortic aneurysm (AAA)  by ultrasound is recommended for people who have history of high blood pressure or who are current or former smokers.  Healthy men should  receive prostate-specific antigen (PSA) blood tests as part of routine cancer screening. Talk with your health care provider about prostate cancer screening.  Testicular cancer screening is  recommended for adult males. Screening includes self-exam, a health care provider exam, and other screening tests. Consult with your health care provider about any symptoms you have or any concerns you have about testicular cancer.  Use sunscreen. Apply sunscreen liberally and repeatedly throughout the day. You should seek shade when your shadow is shorter than you. Protect yourself by wearing long sleeves, pants, a wide-brimmed hat, and sunglasses year round, whenever you are outdoors.  Once a month, do a whole-body skin exam, using a mirror to look at the skin on your back. Tell  your health care provider about new moles, moles that have irregular borders, moles that are larger than a pencil eraser, or moles that have changed in shape or color.  Stay current with required vaccines (immunizations).  Influenza vaccine. All adults should be immunized every year.  Tetanus, diphtheria, and acellular   pertussis (Td, Tdap) vaccine. An adult who has not previously received Tdap or who does not know his vaccine status should receive 1 dose of Tdap. This initial dose should be followed by tetanus and diphtheria toxoids (Td) booster doses every 10 years. Adults with an unknown or incomplete history of completing a 3-dose immunization series with Td-containing vaccines should begin or complete a primary immunization series including a Tdap dose. Adults should receive a Td booster every 10 years.  Zoster vaccine. One dose is recommended for adults aged 60 years or older unless certain conditions are present.    PREVNAR - Pneumococcal 13-valent conjugate (PCV13) vaccine. When indicated, a person who is uncertain of his immunization history and has no record of immunization should receive the PCV13 vaccine. An adult aged 19 years or older who has certain medical conditions and has not been previously immunized should receive 1 dose of PCV13 vaccine. This PCV13 should be followed with a dose of pneumococcal polysaccharide (PPSV23) vaccine. The PPSV23 vaccine dose should be obtained 1 or more year(s)after the dose of PCV13 vaccine. An adult aged 19 years or older who has certain medical conditions and previously received 1 or more doses of PPSV23 vaccine should receive 1 dose of PCV13. The PCV13 vaccine dose should be obtained 1 or more years after the last PPSV23 vaccine dose.    PNEUMOVAX - Pneumococcal polysaccharide (PPSV23) vaccine. When PCV13 is also indicated, PCV13 should be obtained first. All adults aged 65 years and older should be immunized. An adult younger than age 65 years who  has certain medical conditions should be immunized. Any person who resides in a nursing home or long-term care facility should be immunized. An adult smoker should be immunized. People with an immunocompromised condition and certain other conditions should receive both PCV13 and PPSV23 vaccines. People with human immunodeficiency virus (HIV) infection should be immunized as soon as possible after diagnosis. Immunization during chemotherapy or radiation therapy should be avoided. Routine use of PPSV23 vaccine is not recommended for American Indians, [place] Natives, or people younger than 65 years unless there are medical conditions that require PPSV23 vaccine. When indicated, people who have unknown immunization and have no record of immunization should receive PPSV23 vaccine. One-time revaccination 5 years after the first dose of PPSV23 is recommended for people aged 19-64 years who have chronic kidney failure, nephrotic syndrome, asplenia, or immunocompromised conditions. People who received 1-2 doses of PPSV23 before age 65 years should receive another dose of PPSV23 vaccine at age 65 years or later if at least 5 years have passed since the previous dose. Doses of PPSV23 are not needed for people immunized with PPSV23 at or after age 65 years.    Hepatitis A vaccine. Adults who wish to be protected from this disease, have certain high-risk conditions, work with hepatitis A-infected animals, work in hepatitis A research labs, or travel to or work in countries with a high rate of hepatitis A should be immunized. Adults who were previously unvaccinated and who anticipate close contact with an international adoptee during the first 60 days after arrival in the [place] from a country with a high rate of hepatitis A should be immunized.    Hepatitis B vaccine. Adults should be immunized if they wish to be protected from this disease, have certain high-risk conditions, may be exposed to blood or other  infectious body fluids, are household contacts or sex partners of hepatitis B positive people, are clients or workers in certain care facilities,   or travel to or work in countries with a high rate of hepatitis B.   Preventive Service / Frequency   Ages 65 and over  Blood pressure check.  Lipid and cholesterol check.  Lung cancer screening. / Every year if you are aged 55-80 years and have a 30-pack-year history of smoking and currently smoke or have quit within the past 15 years. Yearly screening is stopped once you have quit smoking for at least 15 years or develop a health problem that would prevent you from having lung cancer treatment.  Fecal occult blood test (FOBT) of stool. You may not have to do this test if you get a colonoscopy every 10 years.  Flexible sigmoidoscopy** or colonoscopy.** / Every 5 years for a flexible sigmoidoscopy or every 10 years for a colonoscopy beginning at age 50 and continuing until age 75.  Hepatitis C blood test.** / For all people born from 1945 through 1965 and any individual with known risks for hepatitis C.  Abdominal aortic aneurysm (AAA) screening./ Screening current or former smokers or have Hypertension.  Skin self-exam. / Monthly.  Influenza vaccine. / Every year.  Tetanus, diphtheria, and acellular pertussis (Tdap/Td) vaccine.** / 1 dose of Td every 10 years.   Zoster vaccine.** / 1 dose for adults aged 60 years or older.         Pneumococcal 13-valent conjugate (PCV13) vaccine.    Pneumococcal polysaccharide (PPSV23) vaccine.     Hepatitis A vaccine.** / Consult your health care provider.  Hepatitis B vaccine.** / Consult your health care provider. Screening for abdominal aortic aneurysm (AAA)  by ultrasound is recommended for people who have history of high blood pressure or who are current or former smokers. ++++++++++ Recommend Adult Low Dose Aspirin or  coated  Aspirin 81 mg daily  To reduce risk of Colon Cancer 20 %,   Skin Cancer 26 % ,  Malignant Melanoma 46%  and  Pancreatic cancer 60% ++++++++++++++++++++++ Vitamin D goal  is between 70-100.  Please make sure that you are taking your Vitamin D as directed.  It is very important as a natural anti-inflammatory  helping hair, skin, and nails, as well as reducing stroke and heart attack risk.  It helps your bones and helps with mood. It also decreases numerous cancer risks so please take it as directed.  Low Vit D is associated with a 200-300% higher risk for CANCER  and 200-300% higher risk for HEART   ATTACK  &  STROKE.   ...................................... It is also associated with higher death rate at younger ages,  autoimmune diseases like Rheumatoid arthritis, Lupus, Multiple Sclerosis.    Also many other serious conditions, like depression, Alzheimer's Dementia, infertility, muscle aches, fatigue, fibromyalgia - just to name a few. ++++++++++++++++++++++ Recommend the book "The END of DIETING" by Dr Joel Fuhrman  & the book "The END of DIABETES " by Dr Joel Fuhrman At [employer] - get book & Audio CD's    Being diabetic has a  300% increased risk for heart attack, stroke, cancer, and alzheimer- type vascular dementia. It is very important that you work harder with diet by avoiding all foods that are white. Avoid white rice (brown & wild rice is OK), white potatoes (sweetpotatoes in moderation is OK), White bread or wheat bread or anything made out of white flour like bagels, donuts, rolls, buns, biscuits, cakes, pastries, cookies, pizza crust, and pasta (made from white flour & egg whites) - vegetarian pasta or spinach or wheat   pasta is OK. Multigrain breads like Arnold's or [employer], or multigrain sandwich thins or flatbreads.  Diet, exercise and weight loss can reverse and cure diabetes in the early stages.  Diet, exercise and weight loss is very important in the control and prevention of complications of diabetes which affects every  system in your body, ie. Brain - dementia/stroke, eyes - glaucoma/blindness, heart - heart attack/heart failure, kidneys - dialysis, stomach - gastric paralysis, intestines - malabsorption, nerves - severe painful neuritis, circulation - gangrene & loss of a leg(s), and finally cancer and Alzheimers.    I recommend avoid fried & greasy foods,  sweets/candy, white rice (brown or wild rice or Quinoa is OK), white potatoes (sweet potatoes are OK) - anything made from white flour - bagels, doughnuts, rolls, buns, biscuits,white and wheat breads, pizza crust and traditional pasta made of white flour & egg white(vegetarian pasta or spinach or wheat pasta is OK).  Multi-grain bread is OK - like multi-grain flat bread or sandwich thins. Avoid alcohol in excess. Exercise is also important.    Eat all the vegetables you want - avoid meat, especially red meat and dairy - especially cheese.  Cheese is the most concentrated form of trans-fats which is the worst thing to clog up our arteries. Veggie cheese is OK which can be found in the fresh produce section at [employer] or [employer] or [employer]  ++++++++++++++++++++++ DASH Eating Plan  DASH stands for "Dietary Approaches to Stop Hypertension."   The DASH eating plan is a healthy eating plan that has been shown to reduce high blood pressure (hypertension). Additional health benefits may include reducing the risk of type 2 diabetes mellitus, heart disease, and stroke. The DASH eating plan may also help with weight loss. WHAT DO I NEED TO KNOW ABOUT THE DASH EATING PLAN? For the DASH eating plan, you will follow these general guidelines:  Choose foods with a percent daily value for sodium of less than 5% (as listed on the food label).  Use salt-free seasonings or herbs instead of table salt or sea salt.  Check with your health care provider or pharmacist before using salt substitutes.  Eat lower-sodium products, often labeled as "lower sodium" or "no  salt added."  Eat fresh foods.  Eat more vegetables, fruits, and low-fat dairy products.  Choose whole grains. Look for the word "whole" as the first word in the ingredient list.  Choose fish   Limit sweets, desserts, sugars, and sugary drinks.  Choose heart-healthy fats.  Eat veggie cheese   Eat more home-cooked food and less restaurant, buffet, and fast food.  Limit fried foods.  Cook foods using methods other than frying.  Limit canned vegetables. If you do use them, rinse them well to decrease the sodium.  When eating at a restaurant, ask that your food be prepared with less salt, or no salt if possible.                      WHAT FOODS CAN I EAT? Read Dr Joel Fuhrman's books on The End of Dieting & The End of Diabetes  Grains Whole grain or whole wheat bread. Brown rice. Whole grain or whole wheat pasta. Quinoa, bulgur, and whole grain cereals. Low-sodium cereals. Corn or whole wheat flour tortillas. Whole grain cornbread. Whole grain crackers. Low-sodium crackers.  Vegetables Fresh or frozen vegetables (raw, steamed, roasted, or grilled). Low-sodium or reduced-sodium tomato and vegetable juices. Low-sodium or reduced-sodium tomato sauce and paste. Low-sodium   or reduced-sodium canned vegetables.   Fruits All fresh, canned (in natural juice), or frozen fruits.  Protein Products  All fish and seafood.  Dried beans, peas, or lentils. Unsalted nuts and seeds. Unsalted canned beans.  Dairy Low-fat dairy products, such as skim or 1% milk, 2% or reduced-fat cheeses, low-fat ricotta or cottage cheese, or plain low-fat yogurt. Low-sodium or reduced-sodium cheeses.  Fats and Oils Tub margarines without trans fats. Light or reduced-fat mayonnaise and salad dressings (reduced sodium). Avocado. Safflower, olive, or canola oils. Natural peanut or almond butter.  Other Unsalted popcorn and pretzels. The items listed above may not be a complete list of recommended foods or  beverages. Contact your dietitian for more options.  ++++++++++++++++++++  WHAT FOODS ARE NOT RECOMMENDED? Grains/ White flour or wheat flour White bread. White pasta. White rice. Refined cornbread. Bagels and croissants. Crackers that contain trans fat.  Vegetables  Creamed or fried vegetables. Vegetables in a . Regular canned vegetables. Regular canned tomato sauce and paste. Regular tomato and vegetable juices.  Fruits Dried fruits. Canned fruit in light or heavy syrup. Fruit juice.  Meat and Other Protein Products Meat in general - RED meat & White meat.  Fatty cuts of meat. Ribs, chicken wings, all processed meats as bacon, sausage, bologna, salami, fatback, hot dogs, bratwurst and packaged luncheon meats.  Dairy Whole or 2% milk, cream, half-and-half, and cream cheese. Whole-fat or sweetened yogurt. Full-fat cheeses or blue cheese. Non-dairy creamers and whipped toppings. Processed cheese, cheese spreads, or cheese curds.  Condiments Onion and garlic salt, seasoned salt, table salt, and sea salt. Canned and packaged gravies. Worcestershire sauce. Tartar sauce. Barbecue sauce. Teriyaki sauce. Soy sauce, including reduced sodium. Steak sauce. Fish sauce. Oyster sauce. Cocktail sauce. Horseradish. Ketchup and mustard. Meat flavorings and tenderizers. Bouillon cubes. Hot sauce. Tabasco sauce. Marinades. Taco seasonings. Relishes.  Fats and Oils Butter, stick margarine, lard, shortening and bacon fat. Coconut, palm kernel, or palm oils. Regular salad dressings.  Pickles and olives. Salted popcorn and pretzels.  The items listed above may not be a complete list of foods and beverages to avoid.    

## 2018-12-11 NOTE — Progress Notes (Signed)
[facility]   Unk Pinto, M.D.     Uvaldo Bristle. Silverio Lay, P.A.-C Liane Comber, [address]                [place], [place] [address] Telephone [phone] Telefax [phone]  Comprehensive Evaluation & Examination  History of Present Illness:     This very nice 73 y.o. MWM presents for a  comprehensive evaluation and management of multiple medical co-morbidities.  Patient has been followed for HTN, HLD, T2_NIDDM  and Vitamin D Deficiency. Patient is on CPAP for OSA with improved sleep & daytime alertness.     HTN predates circa 1992. Patient's BP has been controlled at home.  Today's BP is borderline elevated Diastolic BP - 503/54. Patient denies any cardiac symptoms as chest pain, palpitations, shortness of breath, dizziness or ankle swelling.     Patient's hyperlipidemia is controlled with diet and medications. Patient denies myalgias or other medication SE's. Last lipids were not at goal: Lab Results  Component Value Date   CHOL 157 12/12/2018   HDL 28 (L) 12/12/2018   LDLCALC 98 12/12/2018   TRIG 213 (H) 12/12/2018   CHOLHDL 5.6 (H) 12/12/2018      Patient has obesity (BMI 31+) and hx/o preDM (2008) and then T2_NIDDM circa 2014. Patient atient denies reactive hypoglycemic symptoms, visual blurring, diabetic polys or paresthesias. Last A1c was not at goal: Lab Results  Component Value Date   HGBA1C 9.4 (H) 12/12/2018       Finally, patient has history of Vitamin D Deficiency ("22" / 2008) and last vitamin D was not at goal: Lab Results  Component Value Date   VD25OH 35 12/12/2018   Current Outpatient Medications on File Prior to Visit  Medication Sig  . ALPRAZolam (XANAX) 1 MG tablet Take 1/2-1 tablet 2 - 3 x /day ONLY if needed for Anxiety Attack &  limit to 5 days /week to avoid addiction & Do not take while Driving  . aspirin 81 MG tablet Take 81 mg by mouth daily.   . Cholecalciferol (VITAMIN D3) 10000 units capsule Take 10,000 Units by mouth daily.  . fluticasone (FLONASE) 50 MCG/ACT nasal spray Use 2 sprays each nostril  /daily  . glipiZIDE (GLUCOTROL) 5 MG tablet TAKE 1 TABLET(5 MG) BY MOUTH TWICE DAILY BEFORE A MEAL  . glucose blood (FREESTYLE LITE) test strip Check blood sugar 1 time daily-[id number]  . metFORMIN (GLUCOPHAGE-XR) 500 MG 24 hr tablet Take 2 tablets 2 x /day with meals for Diabetes  . mupirocin ointment (BACTROBAN) 2 % APPLY TWICE A DAY TO SKIN INFECTION.  Marland Kitchen oxybutynin (DITROPAN) 5 MG tablet Take 1 tablet 3 x /day for BladderControl  . rosuvastatin (CRESTOR) 10 MG tablet Take 1 tablet (10 mg total) by mouth at bedtime.  . tamsulosin (FLOMAX) 0.4 MG CAPS capsule Take 1 capsule (0.4 mg total) by mouth daily.  . valsartan-hydrochlorothiazide (DIOVAN-HCT) 320-25 MG tablet Take 1 tablet every morning for BP   No current facility-administered medications on file prior to visit.    Allergies  Allergen Reactions  . Cardura [Doxazosin Mesylate] Other (See Comments)    Nasal congestion  . Codeine Other (See Comments)    Pt passes out.  Marland Kitchen Hytrin [Terazosin]    Past Medical History:  Diagnosis Date  . Gout   . Hyperlipidemia   . Hypertension   .  OSA (obstructive sleep apnea)   . Pre-diabetes    Health Maintenance  Topic Date Due  . COLONOSCOPY  10/13/2018  . Hepatitis C Screening  02/21/2019 (Originally Feb 19, 1946)  . INFLUENZA VACCINE  03/22/2019  . HEMOGLOBIN A1C  06/13/2019  . OPHTHALMOLOGY EXAM  09/11/2019  . FOOT EXAM  12/12/2019  . TETANUS/TDAP  05/31/2025  . PNA vac Low Risk Adult  Completed   Immunization History  Administered Date(s) Administered  . DT 06/01/2015  . Influenza Whole 06/05/2013  . Influenza, High Dose Seasonal PF 06/05/2014, 06/01/2015, 06/14/2016, 06/22/2017, 06/11/2018  . Pneumococcal Conjugate-13 08/19/2014  . Pneumococcal Polysaccharide-23 08/22/2003, 12/10/2015  . Tdap 03/08/2004  . Zoster  08/18/2013   Last Colon -  10/13/2008 - Dr Sammuel Cooper - recc 10 yr f/u due 10/2018  Past Surgical History:  Procedure Laterality Date  . FOOT SURGERY  10/2009  . MYRINGOTOMY Left 02-2005   Dr.Newman  . TONSILLECTOMY  1953   Family History  Problem Relation Age of Onset  . Hypertension Mother   . GER disease Mother   . Hyperlipidemia Father   . Hypertension Father   . Parkinson's disease Father   . Hypertension Brother   . Diabetes Maternal Grandmother   . Stroke Paternal Grandmother   . Diabetes Paternal Grandmother   . Stroke Paternal Grandfather    Social History   Socioeconomic History  . Marital status: Married    Spouse name: Not on file  . Number of children: Not on file  . Years of education: Not on file  . Highest education level: Not on file  Occupational History  . Not on file  Tobacco Use  . Smoking status: Never Smoker  . Smokeless tobacco: Never Used  Substance and Sexual Activity  . Alcohol use: No  . Drug use: No  . Sexual activity: Not on file    ROS Constitutional: Denies fever, chills, weight loss/gain, headaches, insomnia,  night sweats or change in appetite. Does c/o fatigue. Eyes: Denies redness, blurred vision, diplopia, discharge, itchy or watery eyes.  ENT: Denies discharge, congestion, post nasal drip, epistaxis, sore throat, earache, hearing loss, dental pain, Tinnitus, Vertigo, Sinus pain or snoring.  Cardio: Denies chest pain, palpitations, irregular heartbeat, syncope, dyspnea, diaphoresis, orthopnea, PND, claudication or edema Respiratory: denies cough, dyspnea, DOE, pleurisy, hoarseness, laryngitis or wheezing.  Gastrointestinal: Denies dysphagia, heartburn, reflux, water brash, pain, cramps, nausea, vomiting, bloating, diarrhea, constipation, hematemesis, melena, hematochezia, jaundice or hemorrhoids Genitourinary: Denies dysuria, frequency, discharge, hematuria or flank pain. Has urgency, nocturia x 2-3 & occasional  hesitancy. Musculoskeletal: Denies arthralgia, myalgia, stiffness, Jt. Swelling, pain, limp or strain/sprain. Denies Falls. Skin: Denies puritis, rash, hives, warts, acne, eczema or change in skin lesion Neuro: No weakness, tremor, incoordination, spasms, paresthesia or pain Psychiatric: Denies confusion, memory loss or sensory loss. Denies Depression. Endocrine: Denies change in weight, skin, hair change, nocturia, and paresthesia, diabetic polys, visual blurring or hyper / hypo glycemic episodes.  Heme/Lymph: No excessive bleeding, bruising or enlarged lymph nodes.  Physical Exam  BP 128/88   Pulse 64   Temp (!) 97.2 F (36.2 C)   Resp 18   Ht 5' 10.5" (1.791 m)   Wt 229 lb 3.2 oz (104 kg)   BMI 32.42 kg/m   General Appearance: Well nourished and well groomed and in no apparent distress.  Eyes: PERRLA, EOMs, conjunctiva no swelling or erythema, normal fundi and vessels. Sinuses: No frontal/maxillary tenderness ENT/Mouth: EACs patent / TMs  nl. Nares clear without erythema, swelling,  mucoid exudates. Oral hygiene is good. No erythema, swelling, or exudate. Tongue normal, non-obstructing. Tonsils not swollen or erythematous. Hearing normal.  Neck: Supple, thyroid not palpable. No bruits, nodes or JVD. Respiratory: Respiratory effort normal.  BS equal and clear bilateral without rales, rhonci, wheezing or stridor. Cardio: Heart sounds are normal with regular rate and rhythm and no murmurs, rubs or gallops. Peripheral pulses are normal and equal bilaterally without edema. No aortic or femoral bruits. Chest: symmetric with normal excursions and percussion.  Abdomen: Soft, with Nl bowel sounds. Nontender, no guarding, rebound, hernias, masses, or organomegaly.  Lymphatics: Non tender without lymphadenopathy.  Musculoskeletal: Full ROM all peripheral extremities, joint stability, 5/5 strength, and normal gait. Skin: Warm and dry without rashes, lesions, cyanosis, clubbing or  ecchymosis.   Neuro: Cranial nerves intact, reflexes equal bilaterally. Normal muscle tone, no cerebellar symptoms. Sensation intact.  Pysch: Alert and oriented X 3 with normal affect, insight and judgment appropriate.   Assessment and Plan  1. Essential hypertension  - EKG 12-Lead - [place], RETROPERITNL ABD,  LTD - Urinalysis, Routine w reflex microscopic - Microalbumin / creatinine urine ratio - CBC with Differential/Platelet - COMPLETE METABOLIC PANEL WITH GFR - Magnesium - TSH  2. Hyperlipidemia, mixed  - EKG 12-Lead - [place], RETROPERITNL ABD,  LTD - Lipid panel - TSH  3. Type 2 diabetes mellitus with stage 3 chronic kidney disease, without long-term current use of insulin (HCC)  - EKG 12-Lead - [place], RETROPERITNL ABD,  LTD - HM DIABETES FOOT EXAM - LOW EXTREMITY NEUR EXAM DOCUM - PTH, intact and calcium - Hemoglobin A1c - Insulin, random  4. Vitamin D deficiency  - VITAMIN D 25 Hydroxyl  5. Gastroesophageal reflux diseasel  - CBC with Differential/Platelet  6. OSA on CPAP  7. Idiopathic chronic gout   - Uric acid  8. BPH with obstruction/lower urinary tract symptoms  - PSA  9. Prostate cancer screening  - PSA  10. Screening for colorectal cancer  - POC Hemoccult Bld/Stl   11. Screening for ischemic heart disease  - EKG 12-Lead  12. FHx: heart disease  - EKG 12-Lead - [place], RETROPERITNL ABD,  LTD  13. Screening for AAA (aortic abdominal aneurysm)  - [place], RETROPERITNL ABD,  LTD  14. Medication management  - Urinalysis, Routine w reflex microscopic - Microalbumin / creatinine urine ratio - CBC with Differential/Platelet - COMPLETE METABOLIC PANEL WITH GFR - Magnesium - Lipid panel - TSH - Hemoglobin A1c - Insulin, random - VITAMIN D 25 Hydroxyl        Patient was counseled in prudent diet, weight control to achieve/maintain BMI less than 25, BP monitoring, regular exercise and medications as discussed.  Discussed med effects and SE's. Routine screening  labs and tests as requested with regular follow-up as recommended. I discussed the assessment and treatment plan as above with the patient. The patient was provided an opportunity to ask questions and all were answered. The patient agreed with the plan and demonstrated an understanding of the instructions.Over 40 minutes of exam, counseling, chart review and high complex critical decision making was performed   Kirtland Bouchard, MD

## 2018-12-12 ENCOUNTER — Other Ambulatory Visit: Payer: Self-pay | Admitting: Internal Medicine

## 2018-12-12 ENCOUNTER — Other Ambulatory Visit: Payer: Self-pay

## 2018-12-12 ENCOUNTER — Ambulatory Visit (INDEPENDENT_AMBULATORY_CARE_PROVIDER_SITE_OTHER): Payer: Medicare Other | Admitting: Internal Medicine

## 2018-12-12 VITALS — BP 128/88 | HR 64 | Temp 97.2°F | Resp 18 | Ht 70.5 in | Wt 229.2 lb

## 2018-12-12 DIAGNOSIS — Z136 Encounter for screening for cardiovascular disorders: Secondary | ICD-10-CM

## 2018-12-12 DIAGNOSIS — M1A00X0 Idiopathic chronic gout, unspecified site, without tophus (tophi): Secondary | ICD-10-CM | POA: Diagnosis not present

## 2018-12-12 DIAGNOSIS — N401 Enlarged prostate with lower urinary tract symptoms: Secondary | ICD-10-CM | POA: Diagnosis not present

## 2018-12-12 DIAGNOSIS — E559 Vitamin D deficiency, unspecified: Secondary | ICD-10-CM | POA: Diagnosis not present

## 2018-12-12 DIAGNOSIS — G4733 Obstructive sleep apnea (adult) (pediatric): Secondary | ICD-10-CM

## 2018-12-12 DIAGNOSIS — Z1211 Encounter for screening for malignant neoplasm of colon: Secondary | ICD-10-CM

## 2018-12-12 DIAGNOSIS — E782 Mixed hyperlipidemia: Secondary | ICD-10-CM

## 2018-12-12 DIAGNOSIS — N138 Other obstructive and reflux uropathy: Secondary | ICD-10-CM

## 2018-12-12 DIAGNOSIS — N183 Chronic kidney disease, stage 3 unspecified: Secondary | ICD-10-CM

## 2018-12-12 DIAGNOSIS — Z1212 Encounter for screening for malignant neoplasm of rectum: Secondary | ICD-10-CM

## 2018-12-12 DIAGNOSIS — I10 Essential (primary) hypertension: Secondary | ICD-10-CM | POA: Diagnosis not present

## 2018-12-12 DIAGNOSIS — Z125 Encounter for screening for malignant neoplasm of prostate: Secondary | ICD-10-CM

## 2018-12-12 DIAGNOSIS — E1122 Type 2 diabetes mellitus with diabetic chronic kidney disease: Secondary | ICD-10-CM | POA: Diagnosis not present

## 2018-12-12 DIAGNOSIS — Z8249 Family history of ischemic heart disease and other diseases of the circulatory system: Secondary | ICD-10-CM

## 2018-12-12 DIAGNOSIS — Z79899 Other long term (current) drug therapy: Secondary | ICD-10-CM

## 2018-12-12 DIAGNOSIS — K219 Gastro-esophageal reflux disease without esophagitis: Secondary | ICD-10-CM | POA: Diagnosis not present

## 2018-12-12 DIAGNOSIS — Z9989 Dependence on other enabling machines and devices: Secondary | ICD-10-CM

## 2018-12-13 LAB — CBC WITH DIFFERENTIAL/PLATELET
Absolute Monocytes: 718 cells/uL (ref 200–950)
Basophils Absolute: 47 cells/uL (ref 0–200)
Basophils Relative: 0.6 %
Eosinophils Absolute: 367 cells/uL (ref 15–500)
Eosinophils Relative: 4.7 %
HCT: 42.1 % (ref 38.5–50.0)
Hemoglobin: 14.4 g/dL (ref 13.2–17.1)
Lymphs Abs: 1802 cells/uL (ref 850–3900)
MCH: 29.1 pg (ref 27.0–33.0)
MCHC: 34.2 g/dL (ref 32.0–36.0)
MCV: 85.2 fL (ref 80.0–100.0)
MPV: 10.7 fL (ref 7.5–12.5)
Monocytes Relative: 9.2 %
Neutro Abs: 4867 cells/uL (ref 1500–7800)
Neutrophils Relative %: 62.4 %
Platelets: 182 10*3/uL (ref 140–400)
RBC: 4.94 10*6/uL (ref 4.20–5.80)
RDW: 13.3 % (ref 11.0–15.0)
Total Lymphocyte: 23.1 %
WBC: 7.8 10*3/uL (ref 3.8–10.8)

## 2018-12-13 LAB — COMPLETE METABOLIC PANEL WITH GFR
AG Ratio: 2.1 (calc) (ref 1.0–2.5)
ALT: 12 U/L (ref 9–46)
AST: 17 U/L (ref 10–35)
Albumin: 4.6 g/dL (ref 3.6–5.1)
Alkaline phosphatase (APISO): 61 U/L (ref 35–144)
BUN/Creatinine Ratio: 16 (calc) (ref 6–22)
BUN: 23 mg/dL (ref 7–25)
CO2: 25 mmol/L (ref 20–32)
Calcium: 9.8 mg/dL (ref 8.6–10.3)
Chloride: 105 mmol/L (ref 98–110)
Creat: 1.45 mg/dL — ABNORMAL HIGH (ref 0.70–1.18)
GFR, Est African American: 56 mL/min/{1.73_m2} — ABNORMAL LOW (ref >=60)
GFR, Est Non African American: 48 mL/min/{1.73_m2} — ABNORMAL LOW (ref >=60)
Globulin: 2.2 g/dL (calc) (ref 1.9–3.7)
Glucose, Bld: 177 mg/dL — ABNORMAL HIGH (ref 65–99)
Potassium: 4.2 mmol/L (ref 3.5–5.3)
Sodium: 139 mmol/L (ref 135–146)
Total Bilirubin: 0.6 mg/dL (ref 0.2–1.2)
Total Protein: 6.8 g/dL (ref 6.1–8.1)

## 2018-12-13 LAB — URINALYSIS, ROUTINE W REFLEX MICROSCOPIC
Bilirubin Urine: NEGATIVE
Hgb urine dipstick: NEGATIVE
Ketones, ur: NEGATIVE
Leukocytes,Ua: NEGATIVE
Nitrite: NEGATIVE
Protein, ur: NEGATIVE
Specific Gravity, Urine: 1.018 (ref 1.001–1.03)
pH: <=5 (ref 5.0–8.0)

## 2018-12-13 LAB — LIPID PANEL
Cholesterol: 157 mg/dL (ref <200)
HDL: 28 mg/dL — ABNORMAL LOW (ref >=40)
LDL Cholesterol (Calc): 98 mg/dL (calc)
Non-HDL Cholesterol (Calc): 129 mg/dL (calc) (ref <130)
Total CHOL/HDL Ratio: 5.6 (calc) — ABNORMAL HIGH (ref <5.0)
Triglycerides: 213 mg/dL — ABNORMAL HIGH (ref <150)

## 2018-12-13 LAB — HEMOGLOBIN A1C
Hgb A1c MFr Bld: 9.4 % of total Hgb — ABNORMAL HIGH (ref <5.7)
Mean Plasma Glucose: 223 (calc)
eAG (mmol/L): 12.4 (calc)

## 2018-12-13 LAB — MICROALBUMIN / CREATININE URINE RATIO
Creatinine, Urine: 103 mg/dL (ref 20–320)
Microalb Creat Ratio: 4 mcg/mg creat (ref <30)
Microalb, Ur: 0.4 mg/dL

## 2018-12-13 LAB — MAGNESIUM: Magnesium: 1.6 mg/dL (ref 1.5–2.5)

## 2018-12-13 LAB — URIC ACID: Uric Acid, Serum: 5.6 mg/dL (ref 4.0–8.0)

## 2018-12-13 LAB — INSULIN, RANDOM: Insulin: 18.9 u[IU]/mL

## 2018-12-13 LAB — TSH: TSH: 2.54 mIU/L (ref 0.40–4.50)

## 2018-12-13 LAB — PTH, INTACT AND CALCIUM
Calcium: 9.8 mg/dL (ref 8.6–10.3)
PTH: 12 pg/mL — ABNORMAL LOW (ref 14–64)

## 2018-12-13 LAB — VITAMIN D 25 HYDROXY (VIT D DEFICIENCY, FRACTURES): Vit D, 25-Hydroxy: 35 ng/mL (ref 30–100)

## 2018-12-13 LAB — PSA: PSA: 1 ng/mL (ref <=4.0)

## 2018-12-16 ENCOUNTER — Encounter: Payer: Self-pay | Admitting: *Deleted

## 2018-12-16 ENCOUNTER — Encounter: Payer: Self-pay | Admitting: Internal Medicine

## 2018-12-19 ENCOUNTER — Other Ambulatory Visit: Payer: Self-pay | Admitting: Internal Medicine

## 2018-12-19 DIAGNOSIS — I10 Essential (primary) hypertension: Secondary | ICD-10-CM

## 2019-01-20 ENCOUNTER — Encounter: Payer: Self-pay | Admitting: Gastroenterology

## 2019-01-22 ENCOUNTER — Other Ambulatory Visit: Payer: Self-pay

## 2019-01-22 ENCOUNTER — Ambulatory Visit (AMBULATORY_SURGERY_CENTER): Payer: Self-pay | Admitting: *Deleted

## 2019-01-22 VITALS — Ht 70.0 in | Wt 225.0 lb

## 2019-01-22 DIAGNOSIS — Z1211 Encounter for screening for malignant neoplasm of colon: Secondary | ICD-10-CM

## 2019-01-22 MED ORDER — PEG 3350-KCL-NA BICARB-NACL 420 G PO SOLR: 4000.0000 mL | Freq: Once | ORAL | 0 refills | Status: AC

## 2019-01-22 NOTE — Progress Notes (Signed)

## 2019-01-31 ENCOUNTER — Telehealth: Payer: Self-pay

## 2019-01-31 NOTE — Telephone Encounter (Signed)
Covid-19 screening questions   Do you now or have you had a fever in the last 14 days? No  Do you have any respiratory symptoms of shortness of breath or cough now or in the last 14 days? No  Do you have any family members or close contacts with diagnosed or suspected Covid-19 in the past 14 days? No  Have you been tested for Covid-19 and found to be positive? No        

## 2019-02-03 ENCOUNTER — Encounter: Payer: Self-pay | Admitting: Gastroenterology

## 2019-02-03 ENCOUNTER — Ambulatory Visit (AMBULATORY_SURGERY_CENTER): Payer: Medicare Other | Admitting: Gastroenterology

## 2019-02-03 ENCOUNTER — Other Ambulatory Visit: Payer: Self-pay

## 2019-02-03 VITALS — BP 145/74 | HR 63 | Temp 98.8°F | Resp 12 | Ht 70.5 in | Wt 229.0 lb

## 2019-02-03 DIAGNOSIS — D123 Benign neoplasm of transverse colon: Secondary | ICD-10-CM

## 2019-02-03 DIAGNOSIS — K5730 Diverticulosis of large intestine without perforation or abscess without bleeding: Secondary | ICD-10-CM

## 2019-02-03 DIAGNOSIS — Z1211 Encounter for screening for malignant neoplasm of colon: Secondary | ICD-10-CM

## 2019-02-03 DIAGNOSIS — I10 Essential (primary) hypertension: Secondary | ICD-10-CM | POA: Diagnosis not present

## 2019-02-03 DIAGNOSIS — D122 Benign neoplasm of ascending colon: Secondary | ICD-10-CM | POA: Diagnosis not present

## 2019-02-03 DIAGNOSIS — K635 Polyp of colon: Secondary | ICD-10-CM | POA: Diagnosis not present

## 2019-02-03 DIAGNOSIS — K649 Unspecified hemorrhoids: Secondary | ICD-10-CM

## 2019-02-03 DIAGNOSIS — E119 Type 2 diabetes mellitus without complications: Secondary | ICD-10-CM | POA: Diagnosis not present

## 2019-02-03 DIAGNOSIS — G4733 Obstructive sleep apnea (adult) (pediatric): Secondary | ICD-10-CM | POA: Diagnosis not present

## 2019-02-03 MED ORDER — SODIUM CHLORIDE 0.9 % IV SOLN: 500.0000 mL | Freq: Once | INTRAVENOUS | Status: DC

## 2019-02-03 NOTE — Progress Notes (Signed)
Called to room to assist during endoscopic procedure.  Patient ID and intended procedure confirmed with present staff. Received instructions for my participation in the procedure from the performing physician.  

## 2019-02-03 NOTE — Patient Instructions (Signed)
Discharge instructions given. Handouts on polyps,diverticulosis and Hemorrhoids. Resume previous medications. YOU HAD AN ENDOSCOPIC PROCEDURE TODAY AT [facility]:   Refer to the procedure report that was given to you for any specific questions about what was found during the examination.  If the procedure report does not answer your questions, please call your gastroenterologist to clarify.  If you requested that your care partner not be given the details of your procedure findings, then the procedure report has been included in a sealed envelope for you to review at your convenience later.  YOU SHOULD EXPECT: Some feelings of bloating in the abdomen. Passage of more gas than usual.  Walking can help get rid of the air that was put into your GI tract during the procedure and reduce the bloating. If you had a lower endoscopy (such as a colonoscopy or flexible sigmoidoscopy) you may notice spotting of blood in your stool or on the toilet paper. If you underwent a bowel prep for your procedure, you may not have a normal bowel movement for a few days.  Please Note:  You might notice some irritation and congestion in your nose or some drainage.  This is from the oxygen used during your procedure.  There is no need for concern and it should clear up in a day or so.  SYMPTOMS TO REPORT IMMEDIATELY:   Following lower endoscopy (colonoscopy or flexible sigmoidoscopy):  Excessive amounts of blood in the stool  Significant tenderness or worsening of abdominal pains  Swelling of the abdomen that is new, acute  Fever of 100F or higher   For urgent or emergent issues, a gastroenterologist can be reached at any hour by calling [phone].   DIET:  We do recommend a small meal at first, but then you may proceed to your regular diet.  Drink plenty of fluids but you should avoid alcoholic beverages for 24 hours.  ACTIVITY:  You should plan to take it easy for the rest of today and you  should NOT DRIVE or use heavy machinery until tomorrow (because of the sedation medicines used during the test).    FOLLOW UP: Our staff will call the number listed on your records 48-72 hours following your procedure to check on you and address any questions or concerns that you may have regarding the information given to you following your procedure. If we do not reach you, we will leave a message.  We will attempt to reach you two times.  During this call, we will ask if you have developed any symptoms of COVID 19. If you develop any symptoms (ie: fever, flu-like symptoms, shortness of breath, cough etc.) before then, please call [phone].  If you test positive for Covid 19 in the 2 weeks post procedure, please call and report this information to [place].    If any biopsies were taken you will be contacted by phone or by letter within the next 1-3 weeks.  Please call us at [phone] if you have not heard about the biopsies in 3 weeks.    SIGNATURES/CONFIDENTIALITY: You and/or your care partner have signed paperwork which will be entered into your electronic medical record.  These signatures attest to the fact that that the information above on your After Visit Summary has been reviewed and is understood.  Full responsibility of the confidentiality of this discharge information lies with you and/or your care-partner.

## 2019-02-03 NOTE — Progress Notes (Signed)
Pt's states no medical or surgical changes since previsit or office visit.  Temp CW Vitals JB 

## 2019-02-03 NOTE — Progress Notes (Signed)
Report to PACU, RN, vss, BBS= Clear.  

## 2019-02-03 NOTE — Op Note (Signed)
[facility] Patient Name: Jeffery Reed Procedure Date: 02/03/2019 10:38 AM MRN: [id number] Endoscopist: Milus Banister , MD Age: 73 Referring MD:  Date of Birth: 04-Feb-1946 Gender: Male Account #: [id number] Procedure:                Colonoscopy Indications:              Screening for colorectal malignant neoplasm Medicines:                Monitored Anesthesia Care Procedure:                Pre-Anesthesia Assessment:                           - Prior to the procedure, a History and Physical                            was performed, and patient medications and                            allergies were reviewed. The patient's tolerance of                            previous anesthesia was also reviewed. The risks                            and benefits of the procedure and the sedation                            options and risks were discussed with the patient.                            All questions were answered, and informed consent                            was obtained. Prior Anticoagulants: The patient has                            taken no previous anticoagulant or antiplatelet                            agents. ASA Grade Assessment: II - A patient with                            mild systemic disease. After reviewing the risks                            and benefits, the patient was deemed in                            satisfactory condition to undergo the procedure.                           After obtaining informed consent, the colonoscope  was passed under direct vision. Throughout the                            procedure, the patient's blood pressure, pulse, and                            oxygen saturations were monitored continuously. The                            Colonoscope was introduced through the anus and                            advanced to the the cecum, identified by                            appendiceal orifice and  ileocecal valve. The                            colonoscopy was performed without difficulty. The                            patient tolerated the procedure well. The quality                            of the bowel preparation was good. The ileocecal                            valve, appendiceal orifice, and rectum were                            photographed. Scope In: 10:43:02 AM Scope Out: 11:00:10 AM Scope Withdrawal Time: 0 hours 10 minutes 58 seconds  Total Procedure Duration: 0 hours 17 minutes 8 seconds  Findings:                 Three sessile polyps were found in the transverse                            colon and ascending colon. The polyps were 3 to 7                            mm in size. These polyps were removed with a cold                            snare. Resection and retrieval were complete.                           Multiple small and large-mouthed diverticula were                            found in the left colon.                           External and internal hemorrhoids were found. The  hemorrhoids were medium-sized.                           The exam was otherwise without abnormality on                            direct and retroflexion views. Complications:            No immediate complications. Estimated blood loss:                            None. Estimated Blood Loss:     Estimated blood loss: none. Impression:               - Three 3 to 7 mm polyps in the transverse colon                            and in the ascending colon, removed with a cold                            snare. Resected and retrieved.                           - Diverticulosis in the left colon.                           - External and internal hemorrhoids.                           - The examination was otherwise normal on direct                            and retroflexion views. Recommendation:           - Patient has a contact number available for                             emergencies. The signs and symptoms of potential                            delayed complications were discussed with the                            patient. Return to normal activities tomorrow.                            Written discharge instructions were provided to the                            patient.                           - Resume previous diet.                           - Continue present medications.  You will receive a letter within 2-3 weeks with the                            pathology results and my final recommendations.                           If the polyp(s) is proven to be 'pre-cancerous' on                            pathology, you will need repeat colonoscopy in 3-7                            years. If the polyp(s) is NOT 'precancerous' on                            pathology then you should repeat colon cancer                            screening in 10 years with colonoscopy without need                            for colon cancer screening by any method prior to                            then (including stool testing). Milus Banister, MD 02/03/2019 11:04:44 AM This report has been signed electronically.

## 2019-02-05 ENCOUNTER — Telehealth: Payer: Self-pay | Admitting: *Deleted

## 2019-02-05 ENCOUNTER — Telehealth: Payer: Self-pay

## 2019-02-05 NOTE — Telephone Encounter (Signed)
NO ANSWER, MESSAGE LEFT FOR PATIENT. 

## 2019-02-05 NOTE — Telephone Encounter (Signed)
1. Have you developed a fever since your procedure?no  2.   Have you had an respiratory symptoms (SOB or cough) since your procedure? no  3.   Have you tested positive for COVID 19 since your procedure no  4.   Have you had any family members/close contacts diagnosed with the COVID 19 since your procedure?  no   If yes to any of these questions please route to Joylene John, RN and Alphonsa Gin, Therapist, sports.  Follow up Call-  Call back number 02/03/2019  Post procedure Call Back phone  # [phone]  Permission to leave phone message Yes  Some recent data might be hidden     Patient questions:  Do you have a fever, pain , or abdominal swelling? No. Pain Score  0 *  Have you tolerated food without any problems? Yes.    Have you been able to return to your normal activities? Yes.    Do you have any questions about your discharge instructions: Diet   No. Medications  No. Follow up visit  No.  Do you have questions or concerns about your Care? No.  Actions: * If pain score is 4 or above: No action needed, pain <4.

## 2019-02-10 ENCOUNTER — Encounter: Payer: Self-pay | Admitting: Gastroenterology

## 2019-03-12 DIAGNOSIS — N183 Chronic kidney disease, stage 3 unspecified: Secondary | ICD-10-CM | POA: Insufficient documentation

## 2019-03-12 DIAGNOSIS — E1122 Type 2 diabetes mellitus with diabetic chronic kidney disease: Secondary | ICD-10-CM | POA: Insufficient documentation

## 2019-03-12 NOTE — Progress Notes (Signed)
Virtual Visit via Telephone Note  I connected with Jeffery Reed on 03/14/19 at 10:00 AM EDT by telephone and verified that I am speaking with the correct person using two identifiers.  Location: Patient: home Provider: [place] office   I discussed the limitations, risks, security and privacy concerns of performing an evaluation and management service by telephone and the availability of in person appointments. I also discussed with the patient that there may be a patient responsible charge related to this service. The patient expressed understanding and agreed to proceed.  I discussed the assessment and treatment plan with the patient. The patient was provided an opportunity to ask questions and all were answered. The patient agreed with the plan and demonstrated an understanding of the instructions.   The patient was advised to call back or seek an in-person evaluation if the symptoms worsen or if the condition fails to improve as anticipated.  I provided 28 minutes of non-face-to-face time during this encounter.   Izora Ribas, NP    MEDICARE ANNUAL WELLNESS VISIT AND FOLLOW UP Assessment:     Medicare annual wellness visit  Essential hypertension -typically fairly controlled recently, mild elevation today, recheck on Monday by NV with labs -cont meds -dash diet -monitor at home  OSA (obstructive sleep apnea) -cont use of cpap machine -sleep hygiene -lose weight  Type II diabetes mellitus with nephropathy (HCC) -Severely elevated fasting values, he admits diet has been poor since mother passed On max dose metformin; increase glipizide to TID and advised to take 2 tabs of glipizide in AM if fasting glucose is 150+; resume TID if fasting consistently <150, and if having any hypoglycemic episodes resume 5 mg BID as currently taking He plans to work aggressively on diet Follow up in 1 month for diabetes pending A1C results -cont diet and exercise - Hemoglobin  A1c  BPH/Prostatism -cont meds -currently without change from prior visit -followed by urology   Hyperlipidemia -cont meds -diet and exercise - Lipid panel   Idiopathic gout, unspecified chronicity, unspecified site Gout- recheck Uric acid as needed, doing well off of allopurinol Diet discussed   Vitamin D deficiency -cont supplement, suggested he continue current dose but to take at least 4 hours apart from PPI, defer recheck to next visit - goal 60-100   Medication management - CBC with Differential/Platelet - CMP/GFR  Obesity - BMI 30-34 Long discussion about weight loss, diet, and exercise Recommended diet heavy in fruits and veggies and low in animal meats, cheeses, and dairy products, appropriate calorie intake Discussed appropriate weight for height  Follow up at next visit   Anxiety Previously was doing well with PRN xanax, use is up significantly since mother's passing; discussed daily agent today which he declines, he understands risks of tolerance and addiction with daily use of xanax and will work on tapering down to <5 days a week and lower doses when possible Stress management techniques discussed, increase water, good sleep hygiene discussed, increase exercise, and increase veggies.    NV scheduled for 7/27 at 9:00 for VS/BP recheck and labs  Over 30 minutes of exam, counseling, chart review, and critical decision making was performed Future Appointments  Date Time Provider [place]  06/17/2019  9:30 AM Unk Pinto, MD [facility] None  01/15/2020 10:00 AM Unk Pinto, MD [facility] None    Plan:   During the course of the visit the patient was educated and counseled about appropriate screening and preventive services including:    Pneumococcal vaccine  Influenza vaccine  Prevnar 13  Td vaccine  Screening electrocardiogram  Colorectal cancer screening  Diabetes screening  Glaucoma screening  Nutrition counseling     Subjective:  Jeffery Reed is a 73 y.o. male who presents for Medicare Annual Wellness Visit and 3 month follow up for HTN, hyperlipidemia, T2 diabetes, and vitamin D Def.   he has a diagnosis of anxiety and is currently prescribed xanax 0.5-1 mg TID PRN, reports symptoms are well controlled on current regimen. he currently takes 1/2-1 tab twice daily, in the last few weeks, but previously would go some weeks without taking at all.   Both mother and father passed away this past year  He has BPH/prostatism followed by urology.   BMI is Body mass index is 30.84 kg/m., he admits exercise has been limited, not eating as well since mother passed.  Wt Readings from Last 3 Encounters:  03/14/19 218 lb (98.9 kg)  02/03/19 229 lb (103.9 kg)  01/22/19 225 lb (102.1 kg)   His blood pressure has been controlled at home, today their BP is BP: (!) 142/70 He does not workout. He denies chest pain, shortness of breath, dizziness.   He is on cholesterol medication (rosuvastatin 10 mg daily) and denies myalgias. His cholesterol is not at goal. The cholesterol last visit was:   Lab Results  Component Value Date   CHOL 157 12/12/2018   HDL 28 (L) 12/12/2018   LDLCALC 98 12/12/2018   TRIG 213 (H) 12/12/2018   CHOLHDL 5.6 (H) 12/12/2018   He has been working on diet and exercise for diabetes with CKD, and denies foot ulcerations, hyperglycemia, hypoglycemia , increased appetite, nausea, paresthesia of the feet, polydipsia, polyuria, visual disturbances, vomiting and weight loss. He checks random fasting glucoses; has been running 200-250 since his mother passed for the past month, currently on metformin 2000 mg and glipizide 5 mg twice daily. Last A1C in the office was:   Lab Results  Component Value Date   HGBA1C 9.4 (H) 12/12/2018   Last GFR Lab Results  Component Value Date   GFRNONAA 48 (L) 12/12/2018    Patient is on Vitamin D supplement, taking 10000 IU, taking in AM with PPI Lab  Results  Component Value Date   VD25OH 35 12/12/2018       Medication Review: Current Outpatient Medications on File Prior to Visit  Medication Sig Dispense Refill  . acetaminophen (TYLENOL) 325 MG tablet Take 650 mg by mouth every 6 (six) hours as needed for mild pain.    Marland Kitchen ALPRAZolam (XANAX) 1 MG tablet Take 1/2-1 tablet 2 - 3 x /day ONLY if needed for Anxiety Attack &  limit to 5 days /week to avoid addiction & Do not take while Driving 80 tablet 0  . Cholecalciferol (VITAMIN D3) 10000 units capsule Take 10,000 Units by mouth daily.    . fenofibrate 160 MG tablet TAKE 1 TABLET   DAILY FOR BLOOD FATS 90 tablet 1  . fluticasone (FLONASE) 50 MCG/ACT nasal spray Use 2 sprays each nostril  /daily 48 g 1  . glipiZIDE (GLUCOTROL) 5 MG tablet TAKE 1 TABLET(5 MG) BY MOUTH TWICE DAILY BEFORE A MEAL 180 tablet 1  . glucose blood (FREESTYLE LITE) test strip Check blood sugar 1 time daily-DX-E11.22 100 each 5  . metFORMIN (GLUCOPHAGE-XR) 500 MG 24 hr tablet Take 2 tablets 2 x /day with meals for Diabetes 360 tablet 1  . mupirocin ointment (BACTROBAN) 2 % APPLY TWICE A DAY TO SKIN  INFECTION. 66 g 1  . omeprazole (PRILOSEC) 40 MG capsule TAKE 1 CAPSULE DAILY FOR ACID REFLUX 90 capsule 1  . oxybutynin (DITROPAN) 5 MG tablet Take 1 tablet 3 x /day for BladderControl (Patient not taking: Reported on 01/22/2019) 270 tablet 1  . tamsulosin (FLOMAX) 0.4 MG CAPS capsule Take 1 capsule (0.4 mg total) by mouth daily. (Patient not taking: Reported on 02/03/2019) 90 capsule 4  . valsartan-hydrochlorothiazide (DIOVAN-HCT) 320-25 MG tablet TAKE 1 TABLET BY MOUTH EVERY MORNING FOR BLOOD PRESSURE 90 tablet 1   No current facility-administered medications on file prior to visit.     Current Problems (verified) Patient Active Problem List   Diagnosis Date Noted  . CKD stage 3 due to type 2 diabetes mellitus ([place]) 03/12/2019  . Anxiety 02/18/2018  . GERD (gastroesophageal reflux disease) 03/12/2016  . Obesity (BMI  30.0-34.9) 06/01/2015  . Medication management 08/19/2014  . BPH/Prostatism 08/19/2014  . Essential hypertension 08/17/2013  . Type 2 diabetes mellitus with stage 3 chronic kidney disease, without long-term current use of insulin ([place]) 08/17/2013  . Vitamin D deficiency 08/17/2013  . Hyperlipidemia associated with type 2 diabetes mellitus ([place])   . Testosterone deficiency   . Gout   . OSA (obstructive sleep apnea)     Screening Tests Immunization History  Administered Date(s) Administered  . DT 06/01/2015  . Influenza Whole 06/05/2013  . Influenza, High Dose Seasonal PF 06/05/2014, 06/01/2015, 06/14/2016, 06/22/2017, 06/11/2018  . Pneumococcal Conjugate-13 08/19/2014  . Pneumococcal Polysaccharide-23 08/22/2003, 12/10/2015  . Tdap 03/08/2004  . Zoster 08/18/2013   Preventative care: Last colonoscopy: 01/2019, 3 polyps removed, due 01/2022 Ct head 2010 CXR 2010  Prior vaccinations: TD or Tdap: 2016  Influenza: 2019  Pneumococcal: 2017 Prevnar13: 2015 Shingles/Zostavax: 2014  Names of Other Physician/Practitioners you currently use: 1. [facility] here for primary care 2. [facility], eye doctor, last visit 09/10/2018 - in chart and abstracted 3. Dr. Leonides Sake, dentist, 2020  Patient Care Team: Unk Pinto, MD as PCP - General (Internal Medicine) Franchot Gallo, MD as Consulting Physician (Urology)  Allergies Allergies  Allergen Reactions  . Cardura [Doxazosin Mesylate] Other (See Comments)    Nasal congestion  . Codeine Other (See Comments)    Pt passes out.  Marland Kitchen Hytrin [Terazosin]     SURGICAL HISTORY He  has a past surgical history that includes Foot surgery (10/2009); Tonsillectomy (1953); Myringotomy (Left, 02-2005); and Colonoscopy. FAMILY HISTORY His family history includes Diabetes in his maternal grandmother and paternal grandmother; GER disease in his mother; Hyperlipidemia in his father; Hypertension in his  brother, father, and mother; Parkinson's disease in his father; Stroke in his paternal grandfather and paternal grandmother. SOCIAL HISTORY He  reports that he has never smoked. He has never used smokeless tobacco. He reports that he does not drink alcohol or use drugs.  MEDICARE WELLNESS OBJECTIVES: Physical activity: Current Exercise Habits: The patient does not participate in regular exercise at present, Exercise limited by: orthopedic condition(s) Cardiac risk factors: Cardiac Risk Factors include: advanced age (>61men, >49 women);male gender;hypertension;dyslipidemia;diabetes mellitus;sedentary lifestyle Depression/mood screen:   Depression screen [facility] 2/9 03/14/2019  Decreased Interest 0  Down, Depressed, Hopeless 1  PHQ - 2 Score 1    ADLs:  In your present state of health, do you have any difficulty performing the following activities: 03/14/2019 12/11/2018  Hearing? N N  Vision? N N  Difficulty concentrating or making decisions? N N  Walking or climbing stairs? N N  Dressing or  bathing? N N  Doing errands, shopping? N N  Some recent data might be hidden     Cognitive Testing  Alert? Yes  Normal Appearance?Yes  Oriented to person? Yes  Place? Yes   Time? Yes  Recall of three objects?  Yes  Can perform simple calculations? Yes  Displays appropriate judgment?Yes  Can read the correct time from a watch face?Yes  EOL planning: Does Patient Have a Medical Advance Directive?: Yes Type of Advance Directive: Healthcare Power of Attorney, Living will Does patient want to make changes to medical advance directive?: No - Patient declined Copy of [place] in Chart?: No - copy requested   Objective:   Today's Vitals   03/14/19 1012  BP: (!) 142/70  Pulse: 91  Weight: 218 lb (98.9 kg)  PainSc: 0-No pain   Body mass index is 30.84 kg/m.  General : Well sounding patient in no apparent distress HEENT: no hoarseness, no cough for duration of visit Lungs:  speaks in complete sentences, no audible wheezing, no apparent distress Neurological: alert, oriented x 3 Psychiatric: pleasant, judgement appropriate   Medicare Attestation I have personally reviewed: The patient's medical and social history Their use of alcohol, tobacco or illicit drugs Their current medications and supplements The patient's functional ability including ADLs,fall risks, home safety risks, cognitive, and hearing and visual impairment Diet and physical activities Evidence for depression or mood disorders  The patient's weight, height, BMI, and visual acuity have been recorded in the chart.  I have made referrals, counseling, and provided education to the patient based on review of the above and I have provided the patient with a written personalized care plan for preventive services.     Izora Ribas, NP   03/14/2019

## 2019-03-14 ENCOUNTER — Other Ambulatory Visit: Payer: Self-pay

## 2019-03-14 ENCOUNTER — Ambulatory Visit: Payer: Medicare Other | Admitting: Adult Health

## 2019-03-14 ENCOUNTER — Other Ambulatory Visit: Payer: Self-pay | Admitting: Adult Health

## 2019-03-14 ENCOUNTER — Encounter: Payer: Self-pay | Admitting: Adult Health

## 2019-03-14 VITALS — BP 142/70 | HR 91 | Wt 218.0 lb

## 2019-03-14 DIAGNOSIS — Z79899 Other long term (current) drug therapy: Secondary | ICD-10-CM | POA: Diagnosis not present

## 2019-03-14 DIAGNOSIS — E1169 Type 2 diabetes mellitus with other specified complication: Secondary | ICD-10-CM | POA: Diagnosis not present

## 2019-03-14 DIAGNOSIS — E669 Obesity, unspecified: Secondary | ICD-10-CM | POA: Diagnosis not present

## 2019-03-14 DIAGNOSIS — N183 Chronic kidney disease, stage 3 unspecified: Secondary | ICD-10-CM

## 2019-03-14 DIAGNOSIS — N401 Enlarged prostate with lower urinary tract symptoms: Secondary | ICD-10-CM

## 2019-03-14 DIAGNOSIS — E559 Vitamin D deficiency, unspecified: Secondary | ICD-10-CM

## 2019-03-14 DIAGNOSIS — N138 Other obstructive and reflux uropathy: Secondary | ICD-10-CM

## 2019-03-14 DIAGNOSIS — K219 Gastro-esophageal reflux disease without esophagitis: Secondary | ICD-10-CM

## 2019-03-14 DIAGNOSIS — E785 Hyperlipidemia, unspecified: Secondary | ICD-10-CM

## 2019-03-14 DIAGNOSIS — R6889 Other general symptoms and signs: Secondary | ICD-10-CM | POA: Diagnosis not present

## 2019-03-14 DIAGNOSIS — E349 Endocrine disorder, unspecified: Secondary | ICD-10-CM | POA: Diagnosis not present

## 2019-03-14 DIAGNOSIS — E1122 Type 2 diabetes mellitus with diabetic chronic kidney disease: Secondary | ICD-10-CM | POA: Diagnosis not present

## 2019-03-14 DIAGNOSIS — Z0001 Encounter for general adult medical examination with abnormal findings: Secondary | ICD-10-CM

## 2019-03-14 DIAGNOSIS — Z0000 Encounter for general adult medical examination without abnormal findings: Secondary | ICD-10-CM

## 2019-03-14 DIAGNOSIS — G4733 Obstructive sleep apnea (adult) (pediatric): Secondary | ICD-10-CM

## 2019-03-14 DIAGNOSIS — M1A00X0 Idiopathic chronic gout, unspecified site, without tophus (tophi): Secondary | ICD-10-CM | POA: Diagnosis not present

## 2019-03-14 DIAGNOSIS — F419 Anxiety disorder, unspecified: Secondary | ICD-10-CM | POA: Diagnosis not present

## 2019-03-14 DIAGNOSIS — I10 Essential (primary) hypertension: Secondary | ICD-10-CM

## 2019-03-14 MED ORDER — ROSUVASTATIN CALCIUM 10 MG PO TABS: 10.0000 mg | ORAL_TABLET | Freq: Every day | ORAL | 1 refills | Status: DC

## 2019-03-14 MED ORDER — GLIPIZIDE 5 MG PO TABS: ORAL_TABLET | ORAL | 1 refills | Status: DC

## 2019-03-17 ENCOUNTER — Other Ambulatory Visit: Payer: Medicare Other

## 2019-03-17 ENCOUNTER — Other Ambulatory Visit: Payer: Self-pay

## 2019-03-17 DIAGNOSIS — I10 Essential (primary) hypertension: Secondary | ICD-10-CM | POA: Diagnosis not present

## 2019-03-17 DIAGNOSIS — E785 Hyperlipidemia, unspecified: Secondary | ICD-10-CM

## 2019-03-17 DIAGNOSIS — E1122 Type 2 diabetes mellitus with diabetic chronic kidney disease: Secondary | ICD-10-CM

## 2019-03-17 DIAGNOSIS — E1169 Type 2 diabetes mellitus with other specified complication: Secondary | ICD-10-CM | POA: Diagnosis not present

## 2019-03-17 DIAGNOSIS — N183 Chronic kidney disease, stage 3 unspecified: Secondary | ICD-10-CM

## 2019-03-17 DIAGNOSIS — Z79899 Other long term (current) drug therapy: Secondary | ICD-10-CM | POA: Diagnosis not present

## 2019-03-18 LAB — COMPLETE METABOLIC PANEL WITH GFR
AG Ratio: 1.7 (calc) (ref 1.0–2.5)
ALT: 17 U/L (ref 9–46)
AST: 20 U/L (ref 10–35)
Albumin: 4.5 g/dL (ref 3.6–5.1)
Alkaline phosphatase (APISO): 67 U/L (ref 35–144)
BUN/Creatinine Ratio: 13 (calc) (ref 6–22)
BUN: 18 mg/dL (ref 7–25)
CO2: 22 mmol/L (ref 20–32)
Calcium: 9.5 mg/dL (ref 8.6–10.3)
Chloride: 103 mmol/L (ref 98–110)
Creat: 1.41 mg/dL — ABNORMAL HIGH (ref 0.70–1.18)
GFR, Est African American: 58 mL/min/{1.73_m2} — ABNORMAL LOW (ref >=60)
GFR, Est Non African American: 50 mL/min/{1.73_m2} — ABNORMAL LOW (ref >=60)
Globulin: 2.6 g/dL (calc) (ref 1.9–3.7)
Glucose, Bld: 305 mg/dL — ABNORMAL HIGH (ref 65–99)
Potassium: 4.3 mmol/L (ref 3.5–5.3)
Sodium: 139 mmol/L (ref 135–146)
Total Bilirubin: 0.7 mg/dL (ref 0.2–1.2)
Total Protein: 7.1 g/dL (ref 6.1–8.1)

## 2019-03-18 LAB — CBC WITH DIFFERENTIAL/PLATELET
Absolute Monocytes: 608 cells/uL (ref 200–950)
Basophils Absolute: 62 cells/uL (ref 0–200)
Basophils Relative: 0.8 %
Eosinophils Absolute: 231 cells/uL (ref 15–500)
Eosinophils Relative: 3 %
HCT: 41.4 % (ref 38.5–50.0)
Hemoglobin: 14.5 g/dL (ref 13.2–17.1)
Lymphs Abs: 1709 cells/uL (ref 850–3900)
MCH: 30.5 pg (ref 27.0–33.0)
MCHC: 35 g/dL (ref 32.0–36.0)
MCV: 87.2 fL (ref 80.0–100.0)
MPV: 10.8 fL (ref 7.5–12.5)
Monocytes Relative: 7.9 %
Neutro Abs: 5090 cells/uL (ref 1500–7800)
Neutrophils Relative %: 66.1 %
Platelets: 170 10*3/uL (ref 140–400)
RBC: 4.75 10*6/uL (ref 4.20–5.80)
RDW: 13.5 % (ref 11.0–15.0)
Total Lymphocyte: 22.2 %
WBC: 7.7 10*3/uL (ref 3.8–10.8)

## 2019-03-18 LAB — HEMOGLOBIN A1C
Hgb A1c MFr Bld: 8.8 % of total Hgb — ABNORMAL HIGH (ref <5.7)
Mean Plasma Glucose: 206 (calc)
eAG (mmol/L): 11.4 (calc)

## 2019-03-18 LAB — LIPID PANEL
Cholesterol: 147 mg/dL (ref <200)
HDL: 27 mg/dL — ABNORMAL LOW (ref >=40)
LDL Cholesterol (Calc): 75 mg/dL (calc)
Non-HDL Cholesterol (Calc): 120 mg/dL (calc) (ref <130)
Total CHOL/HDL Ratio: 5.4 (calc) — ABNORMAL HIGH (ref <5.0)
Triglycerides: 377 mg/dL — ABNORMAL HIGH (ref <150)

## 2019-03-18 LAB — TSH: TSH: 3.44 mIU/L (ref 0.40–4.50)

## 2019-03-18 LAB — MAGNESIUM: Magnesium: 1.6 mg/dL (ref 1.5–2.5)

## 2019-05-03 ENCOUNTER — Other Ambulatory Visit: Payer: Self-pay | Admitting: Internal Medicine

## 2019-05-03 DIAGNOSIS — E1122 Type 2 diabetes mellitus with diabetic chronic kidney disease: Secondary | ICD-10-CM

## 2019-05-03 DIAGNOSIS — N3281 Overactive bladder: Secondary | ICD-10-CM

## 2019-05-13 ENCOUNTER — Ambulatory Visit (INDEPENDENT_AMBULATORY_CARE_PROVIDER_SITE_OTHER): Payer: Medicare Other

## 2019-05-13 ENCOUNTER — Other Ambulatory Visit: Payer: Self-pay

## 2019-05-13 VITALS — BP 140/80 | Temp 97.5°F | Ht 70.5 in | Wt 232.4 lb

## 2019-05-13 DIAGNOSIS — Z23 Encounter for immunization: Secondary | ICD-10-CM | POA: Diagnosis not present

## 2019-05-13 NOTE — Progress Notes (Signed)
PATIENT reports for HD flu and all questions were answer at the time of the visit.

## 2019-06-11 ENCOUNTER — Other Ambulatory Visit: Payer: Self-pay | Admitting: Internal Medicine

## 2019-06-11 DIAGNOSIS — E1122 Type 2 diabetes mellitus with diabetic chronic kidney disease: Secondary | ICD-10-CM

## 2019-06-11 DIAGNOSIS — F419 Anxiety disorder, unspecified: Secondary | ICD-10-CM

## 2019-06-11 DIAGNOSIS — E782 Mixed hyperlipidemia: Secondary | ICD-10-CM

## 2019-06-11 DIAGNOSIS — K219 Gastro-esophageal reflux disease without esophagitis: Secondary | ICD-10-CM

## 2019-06-11 MED ORDER — ALPRAZOLAM 1 MG PO TABS: ORAL_TABLET | ORAL | 0 refills | Status: DC

## 2019-06-13 ENCOUNTER — Other Ambulatory Visit: Payer: Self-pay | Admitting: Internal Medicine

## 2019-06-13 DIAGNOSIS — I10 Essential (primary) hypertension: Secondary | ICD-10-CM

## 2019-06-13 MED ORDER — VALSARTAN-HYDROCHLOROTHIAZIDE 320-25 MG PO TABS: ORAL_TABLET | ORAL | 3 refills | Status: DC

## 2019-06-17 ENCOUNTER — Ambulatory Visit: Payer: Medicare Other | Admitting: Internal Medicine

## 2019-06-17 NOTE — Progress Notes (Signed)
FOLLOW UP  Assessment and Plan:   Hypertension Well controlled with current medications  Monitor blood pressure at home; patient to call if consistently greater than 130/80 Continue DASH diet.   Reminder to go to the ER if any CP, SOB, nausea, dizziness, severe HA, changes vision/speech, left arm numbness and tingling and jaw pain.  Cholesterol Continue medications, goal LDL under 70 Continue low cholesterol diet and exercise.  Check lipid panel.   Diabetes with diabetic chronic kidney disease Continue diet and exercise.  Perform daily foot/skin check, notify office of any concerning changes.  Check A1C If not under 8 will bring back for DM visit Stop liquid sugars  Vitamin D Def/ osteoporosis prevention Continue supplementation Check Vit D level  Continue diet and meds as discussed. Further disposition pending results of labs. Discussed med's effects and SE's.   Over 30 minutes of exam, counseling, chart review, and critical decision making was performed.   Future Appointments  Date Time Provider [place]  01/15/2020 10:00 AM Unk Pinto, MD [facility] None    ----------------------------------------------------------------------------------------------------------------------  HPI 73 y.o. male  presents for 3 month follow up on hypertension, cholesterol, diabetes, GERD, gout, obesity and vitamin D deficiency.   BMI is Body mass index is 32.39 kg/m., he has not been working on diet and exercise. He could not tolerate phentermine and topamax due to side effects.  Wt Readings from Last 3 Encounters:  06/18/19 229 lb (103.9 kg)  05/13/19 232 lb 6.4 oz (105.4 kg)  03/14/19 218 lb (98.9 kg)   His blood pressure has been controlled at home, today their BP is BP: 130/70  He does not workout. He denies chest pain, shortness of breath, dizziness.   He is on cholesterol medication, he is on crestor and fenofibrate and denies myalgias. His cholesterol is not at  goal. The cholesterol last visit was:   Lab Results  Component Value Date   CHOL 147 03/17/2019   HDL 27 (L) 03/17/2019   LDLCALC 75 03/17/2019   TRIG 377 (H) 03/17/2019   CHOLHDL 5.4 (H) 03/17/2019    He has not been working on diet and exercise for diabetes With CKD stage 3 on diovan hyperlipitemia on crestor Glucotrol 5mg , he is on 2 in the morning and 1 at night Metformin 500mg  ER 4 pills total a day Eye exam Jan 2020 Meter: Freestyle Sugars have been 200 in the morning  denies increased appetite, nausea, paresthesia of the feet, polydipsia, polyuria and visual disturbances he endorses hyperglycemia.  Last A1C in the office was:  Lab Results  Component Value Date   HGBA1C 8.8 (H) 03/17/2019   Lab Results  Component Value Date   GFRNONAA 50 (L) 03/17/2019   Patient is on high dose Vitamin D supplement but remains below goal:    Lab Results  Component Value Date   VD25OH 35 12/12/2018       Current Medications:  Current Outpatient Medications on File Prior to Visit  Medication Sig  . acetaminophen (TYLENOL) 325 MG tablet Take 650 mg by mouth every 6 (six) hours as needed for mild pain.  Marland Kitchen ALPRAZolam (XANAX) 1 MG tablet Take 1/2-1 tablet 2 - 3 x /day ONLY if needed for Anxiety Attack &  limit to 5 days /week to avoid addiction & Do not take while Driving  . Cholecalciferol (VITAMIN D3) 10000 units capsule Take 10,000 Units by mouth daily.  . fenofibrate 160 MG tablet TAKE 1 TABLET BY MOUTH  DAILY FOR BLOOD FATS  .  fluticasone (FLONASE) 50 MCG/ACT nasal spray Use 2 sprays each nostril  /daily  . glipiZIDE (GLUCOTROL) 5 MG tablet TAKE 1 TABLET BY MOUTH  TWICE A DAY WITH MEALS FOR  DIABETES  . glucose blood (FREESTYLE LITE) test strip Check blood sugar 1 time daily-DX-E11.22  . metFORMIN (GLUCOPHAGE-XR) 500 MG 24 hr tablet Take 2 tablets 2 x /day with Meals for Diabetes  . mupirocin ointment (BACTROBAN) 2 % APPLY TWICE A DAY TO SKIN INFECTION.  Marland Kitchen omeprazole (PRILOSEC) 40 MG  capsule TAKE 1 CAPSULE BY MOUTH  DAILY FOR ACID REFLUX  . oxybutynin (DITROPAN) 5 MG tablet Take 1 tablet 3 x /day  For Bladder Control  . rosuvastatin (CRESTOR) 10 MG tablet Take 1 tablet (10 mg total) by mouth daily.  . tamsulosin (FLOMAX) 0.4 MG CAPS capsule Take 1 capsule (0.4 mg total) by mouth daily.  . valsartan-hydrochlorothiazide (DIOVAN-HCT) 320-25 MG tablet Take 1 tablet Daily for BP   No current facility-administered medications on file prior to visit.      Allergies:  Allergies  Allergen Reactions  . Cardura [Doxazosin Mesylate] Other (See Comments)    Nasal congestion  . Codeine Other (See Comments)    Pt passes out.  Marland Kitchen Hytrin [Terazosin]      Medical History:  Past Medical History:  Diagnosis Date  . Allergy   . Anxiety   . Cataract    beginning stage 01/22/2019  . Chronic kidney disease    enlarge bladder  . Diabetes mellitus without complication ([place])   . GERD (gastroesophageal reflux disease)   . Gout   . Hyperlipidemia   . Hypertension   . OSA (obstructive sleep apnea)   . Pre-diabetes   . Sleep apnea    c pap   Family history- Reviewed and unchanged Social history- Reviewed and unchanged   Review of Systems:  Review of Systems  Constitutional: Negative for malaise/fatigue and weight loss.  HENT: Negative for hearing loss and tinnitus.   Eyes: Negative for blurred vision and double vision.  Respiratory: Negative for cough, sputum production, shortness of breath and wheezing.   Cardiovascular: Negative for chest pain, palpitations, orthopnea, claudication and leg swelling.  Gastrointestinal: Negative for abdominal pain, blood in stool, constipation, diarrhea, heartburn, melena, nausea and vomiting.  Genitourinary: Negative.  Negative for frequency.  Musculoskeletal: Negative for falls, joint pain and myalgias.  Skin: Negative for rash.  Neurological: Negative for dizziness, tingling, sensory change, weakness and headaches.  Endo/Heme/Allergies:  Negative for polydipsia.  Psychiatric/Behavioral: Negative.   All other systems reviewed and are negative.     Physical Exam: BP 130/70   Pulse 72   Temp (!) 97.2 F (36.2 C)   Wt 229 lb (103.9 kg)   SpO2 95%   BMI 32.39 kg/m  Wt Readings from Last 3 Encounters:  06/18/19 229 lb (103.9 kg)  05/13/19 232 lb 6.4 oz (105.4 kg)  03/14/19 218 lb (98.9 kg)   General Appearance: Well nourished, in no apparent distress. Eyes: PERRLA, EOMs, conjunctiva no swelling or erythema Sinuses: No Frontal/maxillary tenderness ENT/Mouth: Ext aud canals clear, TMs without erythema, bulging. No erythema, swelling, or exudate on post pharynx.  Tonsils not swollen or erythematous. Hearing normal.  Neck: Supple, thyroid normal.  Respiratory: Respiratory effort normal, BS equal bilaterally without rales, rhonchi, wheezing or stridor.  Cardio: RRR with no MRGs. Brisk peripheral pulses without edema.  Abdomen: Soft, + BS.  Non tender, no guarding, rebound, hernias, masses. Lymphatics: Non tender without lymphadenopathy.  Musculoskeletal: Full  ROM, 5/5 strength, Normal gait Skin: Warm, dry without rashes, lesions, ecchymosis.  Neuro: Cranial nerves intact. No cerebellar symptoms.  Psych: Awake and oriented X 3, normal affect, Insight and Judgment appropriate.    Vicie Mutters, PA-C 10:58 AM [facility]

## 2019-06-18 ENCOUNTER — Encounter: Payer: Self-pay | Admitting: Physician Assistant

## 2019-06-18 ENCOUNTER — Other Ambulatory Visit: Payer: Self-pay

## 2019-06-18 ENCOUNTER — Ambulatory Visit (INDEPENDENT_AMBULATORY_CARE_PROVIDER_SITE_OTHER): Payer: Medicare Other | Admitting: Physician Assistant

## 2019-06-18 VITALS — BP 130/70 | HR 72 | Temp 97.2°F | Wt 229.0 lb

## 2019-06-18 DIAGNOSIS — Z79899 Other long term (current) drug therapy: Secondary | ICD-10-CM | POA: Diagnosis not present

## 2019-06-18 DIAGNOSIS — E559 Vitamin D deficiency, unspecified: Secondary | ICD-10-CM

## 2019-06-18 DIAGNOSIS — I10 Essential (primary) hypertension: Secondary | ICD-10-CM | POA: Diagnosis not present

## 2019-06-18 DIAGNOSIS — E1169 Type 2 diabetes mellitus with other specified complication: Secondary | ICD-10-CM

## 2019-06-18 DIAGNOSIS — E1122 Type 2 diabetes mellitus with diabetic chronic kidney disease: Secondary | ICD-10-CM | POA: Diagnosis not present

## 2019-06-18 DIAGNOSIS — E785 Hyperlipidemia, unspecified: Secondary | ICD-10-CM | POA: Diagnosis not present

## 2019-06-18 DIAGNOSIS — N183 Chronic kidney disease, stage 3 unspecified: Secondary | ICD-10-CM

## 2019-06-18 NOTE — Patient Instructions (Signed)
Your ears and sinuses are connected by the eustachian tube. When your sinuses are inflamed, this can close off the tube and cause fluid to collect in your middle ear. This can then cause dizziness, popping, clicking, ringing, and echoing in your ears. This is often NOT an infection and does NOT require antibiotics, it is caused by inflammation so the treatments help the inflammation. This can take a long time to get better so please be patient.  Here are things you can do to help with this: - Try the Flonase or Nasonex. Remember to spray each nostril twice towards the outer part of your eye.  Do not sniff but instead pinch your nose and tilt your head back to help the medicine get into your sinuses.  The best time to do this is at bedtime.Stop if you get blurred vision or nose bleeds.  -While drinking fluids, pinch and hold nose close and swallow, to help open eustachian tubes to drain fluid behind ear drums. -Please pick one of the over the counter allergy medications below and take it once daily for allergies.  It will also help with fluid behind ear drums. Claritin or loratadine cheapest but likely the weakest  Zyrtec or certizine at night because it can make you sleepy The strongest is allegra or fexafinadine  Cheapest at [employer], [employer], [employer] -can use decongestant over the counter, please do not use if you have high blood pressure or certain heart conditions.   if worsening HA, changes vision/speech, imbalance, weakness go to the ER   General eating tips  What to Avoid . Avoid added sugars o Often added sugar can be found in processed foods such as many condiments, dry cereals, cakes, cookies, chips, crisps, crackers, candies, sweetened drinks, etc.  o Read labels and AVOID/DECREASE use of foods with the following in their ingredient list: Sugar, fructose, high fructose corn syrup, sucrose, glucose, maltose, dextrose, molasses, cane sugar, brown sugar, any type of syrup, agave nectar, etc.    . Avoid snacking in between meals- drink water or if you feel you need a snack, pick a high water content snack such as cucumbers, watermelon, or any veggie.  Marland Kitchen Avoid foods made with flour o If you are going to eat food made with flour, choose those made with whole-grains; and, minimize your consumption as much as is tolerable . Avoid processed foods o These foods are generally stocked in the middle of the grocery store.  o Focus on shopping on the perimeter of the grocery.  What to Include . Vegetables o GREEN LEAFY VEGETABLES: Kale, spinach, mustard greens, collard greens, cabbage, broccoli, etc. o OTHER: Asparagus, cauliflower, eggplant, carrots, peas, Brussel sprouts, tomatoes, bell peppers, zucchini, beets, cucumbers, etc. . Grains, seeds, and legumes o Beans: kidney beans, black eyed peas, garbanzo beans, black beans, pinto beans, etc. o Whole, unrefined grains: brown rice, barley, bulgur, oatmeal, etc. . Healthy fats  o Avoid highly processed fats such as vegetable oil o Examples of healthy fats: avocado, olives, virgin olive oil, dark chocolate (?72% Cocoa), nuts (peanuts, almonds, walnuts, cashews, pecans, etc.) o Please still do small amount of these healthy fats, they are dense in calories.  . Low - Moderate Intake of Animal Sources of Protein o Meat sources: chicken, [place], salmon, tuna. Limit to 4 ounces of meat at one time or the size of your palm. o Consider limiting dairy sources, but when choosing dairy focus on: PLAIN [place] yogurt, cottage cheese, high-protein milk . Fruit o Choose berries  Diabetes or even increased sugars put you at 300% increased risk of heart attack and stroke.  ALSO BEING DIABETIC YOU MAY NOT HAVE ANY PAIN WITH A HEART ATTACK.  Even worse of a chance of no pain if you are a woman.  It is very unlikely that you will have any pain with a heart attack. Likely your symptoms will be very subtle, even for very severe disease.  Your symptoms for a  heart attack will likely occur when you exert your self or exercise and include: Shortness of breath Sweating Nausea Dizziness Fast or irregular heart beats Fatigue   It makes me feel better if my diabetics get their heart rate up with exercise once or twice a week and pay close attention to your body. If there is ANY change in your exercise capacity or if you have symptoms above, please STOP and call 911 or call to come to the office.   PLEASE REMEMBER:  Diabetes is preventable! Up to 3 percent of complications and morbidities among individuals with type 2 diabetes can be prevented, delayed, or effectively treated and minimized with regular visits to a health professional, appropriate monitoring and medication, and a healthy diet and lifestyle.   Here is some information to help you keep your heart healthy: Move it! - Aim for 30 mins of activity every day. Take it slowly at first. Talk to [place] before starting any new exercise program.   Lose it.  -Body Mass Index (BMI) can indicate if you need to lose weight. A healthy range is 18.5-24.9. For a BMI calculator, go to [employer]  Waist Management -Excess abdominal fat is a risk factor for heart disease, diabetes, asthma, stroke and more. Ideal waist circumference is less than 35" for women and less than 40" for men.   Eat Right -focus on fruits, vegetables, whole grains, and meals you make yourself. Avoid foods with trans fat and high sugar/sodium content.   Snooze or Snore? - Loud snoring can be a sign of sleep apnea, a significant risk factor for high blood pressure, heart attach, stroke, and heart arrhythmias.  Kick the habit -Quit Smoking! Avoid second hand smoke. A single cigarette raises your blood pressure for 20 mins and increases the risk of heart attack and stroke for the next 24 hours.   Are Aspirin and Supplements right for you? -Add ENTERIC COATED low dose 81 mg Aspirin daily OR can do every other day if you have easy  bruising to protect your heart and head. As well as to reduce risk of Colon Cancer by 20 %, Skin Cancer by 26 % , Melanoma by 46% and Pancreatic cancer by 60%  Say "No to Stress -There may be little you can do about problems that cause stress. However, techniques such as long walks, meditation, and exercise can help you manage it.   Start Now! - Make changes one at a time and set reasonable goals to increase your likelihood of success.

## 2019-06-19 LAB — LIPID PANEL
Cholesterol: 165 mg/dL (ref <200)
HDL: 25 mg/dL — ABNORMAL LOW (ref >=40)
LDL Cholesterol (Calc): 95 mg/dL (calc)
Non-HDL Cholesterol (Calc): 140 mg/dL (calc) — ABNORMAL HIGH (ref <130)
Total CHOL/HDL Ratio: 6.6 (calc) — ABNORMAL HIGH (ref <5.0)
Triglycerides: 400 mg/dL — ABNORMAL HIGH (ref <150)

## 2019-06-19 LAB — COMPLETE METABOLIC PANEL WITH GFR
AG Ratio: 2.1 (calc) (ref 1.0–2.5)
ALT: 16 U/L (ref 9–46)
AST: 16 U/L (ref 10–35)
Albumin: 4.6 g/dL (ref 3.6–5.1)
Alkaline phosphatase (APISO): 47 U/L (ref 35–144)
BUN/Creatinine Ratio: 16 (calc) (ref 6–22)
BUN: 27 mg/dL — ABNORMAL HIGH (ref 7–25)
CO2: 26 mmol/L (ref 20–32)
Calcium: 9.9 mg/dL (ref 8.6–10.3)
Chloride: 103 mmol/L (ref 98–110)
Creat: 1.73 mg/dL — ABNORMAL HIGH (ref 0.70–1.18)
GFR, Est African American: 45 mL/min/{1.73_m2} — ABNORMAL LOW (ref >=60)
GFR, Est Non African American: 39 mL/min/{1.73_m2} — ABNORMAL LOW (ref >=60)
Globulin: 2.2 g/dL (calc) (ref 1.9–3.7)
Glucose, Bld: 292 mg/dL — ABNORMAL HIGH (ref 65–99)
Potassium: 4.6 mmol/L (ref 3.5–5.3)
Sodium: 140 mmol/L (ref 135–146)
Total Bilirubin: 0.5 mg/dL (ref 0.2–1.2)
Total Protein: 6.8 g/dL (ref 6.1–8.1)

## 2019-06-19 LAB — CBC WITH DIFFERENTIAL/PLATELET
Absolute Monocytes: 656 cells/uL (ref 200–950)
Basophils Absolute: 50 cells/uL (ref 0–200)
Basophils Relative: 0.6 %
Eosinophils Absolute: 257 cells/uL (ref 15–500)
Eosinophils Relative: 3.1 %
HCT: 43 % (ref 38.5–50.0)
Hemoglobin: 14.6 g/dL (ref 13.2–17.1)
Lymphs Abs: 1868 cells/uL (ref 850–3900)
MCH: 30.4 pg (ref 27.0–33.0)
MCHC: 34 g/dL (ref 32.0–36.0)
MCV: 89.4 fL (ref 80.0–100.0)
MPV: 10.7 fL (ref 7.5–12.5)
Monocytes Relative: 7.9 %
Neutro Abs: 5470 cells/uL (ref 1500–7800)
Neutrophils Relative %: 65.9 %
Platelets: 178 10*3/uL (ref 140–400)
RBC: 4.81 10*6/uL (ref 4.20–5.80)
RDW: 13.1 % (ref 11.0–15.0)
Total Lymphocyte: 22.5 %
WBC: 8.3 10*3/uL (ref 3.8–10.8)

## 2019-06-19 LAB — VITAMIN D 25 HYDROXY (VIT D DEFICIENCY, FRACTURES): Vit D, 25-Hydroxy: 39 ng/mL (ref 30–100)

## 2019-06-19 LAB — HEMOGLOBIN A1C
Hgb A1c MFr Bld: 9.4 % of total Hgb — ABNORMAL HIGH (ref <5.7)
Mean Plasma Glucose: 223 (calc)
eAG (mmol/L): 12.4 (calc)

## 2019-06-19 LAB — TSH: TSH: 4.31 mIU/L (ref 0.40–4.50)

## 2019-06-19 LAB — MAGNESIUM: Magnesium: 1.9 mg/dL (ref 1.5–2.5)

## 2019-07-15 NOTE — Progress Notes (Signed)
Diabetes Education and Follow-Up Visit  73 y.o.male presents for diabetic education. He has Diabetes Mellitus type 2:with CKD, with hyperlipidemia he is on bASA, and denies polydipsia, polyuria and visual disturbances.  Last hemoglobin A1c was: Lab Results  Component Value Date   HGBA1C 9.4 (H) 06/18/2019   HGBA1C 8.8 (H) 03/17/2019   HGBA1C 9.4 (H) 12/12/2018    Body mass index is 32.54 kg/m.  Pt is on a regimen of: Metformin 4 a day need to cut back due to kidney function being below 45 to 2 a day Glipizide 5 mg 2 in the am and 1 at night  Pt checks his sugars 1 x week Sugar is 250 mid morning, does eat breakfast Glucometer: freestyle  Exercise: has walking last few weeks  Patient does have CKD He is on ACE/ARB Lab Results  Component Value Date   GFRNONAA 39 (L) 06/18/2019    Lab Results  Component Value Date   CREATININE 1.73 (H) 06/18/2019   BUN 27 (H) 06/18/2019   NA 140 06/18/2019   K 4.6 06/18/2019   CL 103 06/18/2019   CO2 26 06/18/2019    Lab Results  Component Value Date   MICROALBUR 0.4 12/12/2018    He is on a Statin. Crestor 10, not increased last visit.  He is not at goal of less than 70.  Lab Results  Component Value Date   CHOL 165 06/18/2019   HDL 25 (L) 06/18/2019   LDLCALC 95 06/18/2019   TRIG 400 (H) 06/18/2019   CHOLHDL 6.6 (H) 06/18/2019     Problem List has Hyperlipidemia associated with type 2 diabetes mellitus ([place]); Testosterone deficiency; Gout; OSA (obstructive sleep apnea); Essential hypertension; Type 2 diabetes mellitus with stage 3 chronic kidney disease, without long-term current use of insulin ([place]); Vitamin D deficiency; Medication management; BPH/Prostatism; Obesity (BMI 30.0-34.9); GERD (gastroesophageal reflux disease); Anxiety; and CKD stage 3 due to type 2 diabetes mellitus ([place]) on their problem list.  Medications . Current Outpatient Medications (Endocrine & Metabolic):  .  glipiZIDE (GLUCOTROL) 5 MG tablet,  TAKE 1 TABLET BY MOUTH  TWICE A DAY WITH MEALS FOR  DIABETES .  metFORMIN (GLUCOPHAGE-XR) 500 MG 24 hr tablet, Take 2 tablets 2 x /day with Meals for Diabetes  Current Outpatient Medications (Cardiovascular):  .  fenofibrate 160 MG tablet, TAKE 1 TABLET BY MOUTH  DAILY FOR BLOOD FATS .  rosuvastatin (CRESTOR) 10 MG tablet, Take 1 tablet (10 mg total) by mouth daily. .  valsartan-hydrochlorothiazide (DIOVAN-HCT) 320-25 MG tablet, Take 1 tablet Daily for BP  Current Outpatient Medications (Respiratory):  .  fluticasone (FLONASE) 50 MCG/ACT nasal spray, Use 2 sprays each nostril  /daily  Current Outpatient Medications (Analgesics):  .  acetaminophen (TYLENOL) 325 MG tablet, Take 650 mg by mouth every 6 (six) hours as needed for mild pain.   Current Outpatient Medications (Other):  Marland Kitchen  ALPRAZolam (XANAX) 1 MG tablet, Take 1/2-1 tablet 2 - 3 x /day ONLY if needed for Anxiety Attack &  limit to 5 days /week to avoid addiction & Do not take while Driving .  Cholecalciferol (VITAMIN D3) 10000 units capsule, Take 10,000 Units by mouth daily. Marland Kitchen  glucose blood (FREESTYLE LITE) test strip, Check blood sugar 1 time daily-DX-E11.22 .  mupirocin ointment (BACTROBAN) 2 %, APPLY TWICE A DAY TO SKIN INFECTION. Marland Kitchen  omeprazole (PRILOSEC) 40 MG capsule, TAKE 1 CAPSULE BY MOUTH  DAILY FOR ACID REFLUX .  oxybutynin (DITROPAN) 5 MG tablet, Take 1 tablet  3 x /day  For Bladder Control .  tamsulosin (FLOMAX) 0.4 MG CAPS capsule, Take 1 capsule (0.4 mg total) by mouth daily.  ROS- see HPI  Physical Exam: Blood pressure 136/80, pulse 86, temperature (!) 97 F (36.1 C), weight 230 lb (104.3 kg), SpO2 96 %. Body mass index is 32.54 kg/m. General Appearance: Well nourished, in no apparent distress. Eyes: PERRLA, EOMs, conjunctiva no swelling or erythema ENT/Mouth: Ext aud canals clear, TMs without erythema, bulging. No erythema, swelling, or exudate on post pharynx.  Tonsils not swollen or erythematous. Hearing  normal.  Respiratory: Respiratory effort normal, BS equal bilaterally without rales, rhonchi, wheezing or stridor.  Cardio: RRR with no MRGs. Brisk peripheral pulses without edema.  Abdomen: Soft, + BS.  Non tender, no guarding, rebound, hernias, masses. Musculoskeletal: Full ROM, 5/5 strength, normal gait.  Skin: Warm, dry without rashes, lesions, ecchymosis.  Neuro: Cranial nerves intact. Normal muscle tone, no cerebellar symptoms. Sensation intact.    Plan and Assessment: Diabetes Education: Reviewed 'ABCs' of diabetes management (respective goals in parentheses):  A1C (<7), blood pressure (<130/80), and cholesterol (LDL <70) Eye Exam yearly and Dental Exam every 6 months. Dietary recommendations Physical Activity recommendations - Strongly advised him to start checking sugars at different times of the day - check 2 times a day, rotating checks- sent in freestyle libre for uncontrolled sugars Increase crestor to 20 mg daily Decrease metformin due to CKD stage 3/4, GFR below 45, will cut back to 2 pills a day and refer to nephrology, continue ARB for now - may need to switch to insulin, check sugars, follow up 1 month, will decide to switch to insulin or not at that visit.  - given sugar log and advised how to fill it and to bring it at next appt  - given foot care handout and explained the principles  - given instructions for hypoglycemia management    Future Appointments  Date Time Provider [place]  09/18/2019 10:45 AM Vicie Mutters, PA-C [facility] None  01/15/2020 10:00 AM Unk Pinto, MD [facility] None

## 2019-07-16 ENCOUNTER — Ambulatory Visit (INDEPENDENT_AMBULATORY_CARE_PROVIDER_SITE_OTHER): Payer: Medicare Other | Admitting: Physician Assistant

## 2019-07-16 ENCOUNTER — Other Ambulatory Visit: Payer: Self-pay

## 2019-07-16 ENCOUNTER — Encounter: Payer: Self-pay | Admitting: Physician Assistant

## 2019-07-16 VITALS — BP 136/80 | HR 86 | Temp 97.0°F | Wt 230.0 lb

## 2019-07-16 DIAGNOSIS — I10 Essential (primary) hypertension: Secondary | ICD-10-CM | POA: Diagnosis not present

## 2019-07-16 DIAGNOSIS — E1365 Other specified diabetes mellitus with hyperglycemia: Secondary | ICD-10-CM | POA: Diagnosis not present

## 2019-07-16 DIAGNOSIS — N183 Chronic kidney disease, stage 3 unspecified: Secondary | ICD-10-CM | POA: Diagnosis not present

## 2019-07-16 DIAGNOSIS — E1329 Other specified diabetes mellitus with other diabetic kidney complication: Secondary | ICD-10-CM | POA: Diagnosis not present

## 2019-07-16 DIAGNOSIS — IMO0002 Reserved for concepts with insufficient information to code with codable children: Secondary | ICD-10-CM

## 2019-07-16 DIAGNOSIS — E669 Obesity, unspecified: Secondary | ICD-10-CM | POA: Diagnosis not present

## 2019-07-16 DIAGNOSIS — E1169 Type 2 diabetes mellitus with other specified complication: Secondary | ICD-10-CM

## 2019-07-16 DIAGNOSIS — E785 Hyperlipidemia, unspecified: Secondary | ICD-10-CM | POA: Diagnosis not present

## 2019-07-16 DIAGNOSIS — E1122 Type 2 diabetes mellitus with diabetic chronic kidney disease: Secondary | ICD-10-CM | POA: Diagnosis not present

## 2019-07-16 MED ORDER — FREESTYLE LIBRE SENSOR SYSTEM MISC: 3 refills | Status: DC

## 2019-07-16 MED ORDER — METFORMIN HCL ER 500 MG PO TB24: ORAL_TABLET | ORAL | 3 refills | Status: DC

## 2019-07-16 MED ORDER — ROSUVASTATIN CALCIUM 20 MG PO TABS: 20.0000 mg | ORAL_TABLET | Freq: Every day | ORAL | 1 refills | Status: DC

## 2019-07-16 NOTE — Patient Instructions (Signed)
Stop the liquid sugars, no soda, no sweet tea, no milk, no fruit juice  Will refer to kidney doctor or nephrologist.   Cut your metformin down to 2 pills a day due to your kidney function  Increase your crestor to 2 a day of the 10mg  until you run out and then get the new medication of the 20 mg pill.   WILL TRY TO SEND IN FREESTYLE LIBRE, IF THIS IS NOT COVERED WE CAN TRY TO DO DEXCOM.   NEED TO CHECK YOUR SUGAR AT LEAST ONCE A DAY.   PLEASE CHECK IT IN THE MORNING AND 2 HOURS AFTER FOODS.   MAY NEED TO START INSULIN IF YOUR SUGAR DOES NOT COME DOWN.      Bad carbs also include fruit juice, alcohol, and sweet tea. These are empty calories that do not signal to your brain that you are full.   Please remember the good carbs are still carbs which convert into sugar. So please measure them out no more than 1/2-1 cup of rice, oatmeal, pasta, and beans  Veggies are however free foods! Pile them on.   Not all fruit is created equal. Please see the list below, the fruit at the bottom is higher in sugars than the fruit at the top. Please avoid all dried fruits.    General eating tips  What to Avoid . Avoid added sugars o Often added sugar can be found in processed foods such as many condiments, dry cereals, cakes, cookies, chips, crisps, crackers, candies, sweetened drinks, etc.  o Read labels and AVOID/DECREASE use of foods with the following in their ingredient list: Sugar, fructose, high fructose corn syrup, sucrose, glucose, maltose, dextrose, molasses, cane sugar, brown sugar, any type of syrup, agave nectar, etc.   . Avoid snacking in between meals- drink water or if you feel you need a snack, pick a high water content snack such as cucumbers, watermelon, or any veggie.  Marland Kitchen Avoid foods made with flour o If you are going to eat food made with flour, choose those made with whole-grains; and, minimize your consumption as much as is tolerable . Avoid processed foods o These foods are  generally stocked in the middle of the grocery store.  o Focus on shopping on the perimeter of the grocery.  What to Include . Vegetables o GREEN LEAFY VEGETABLES: Kale, spinach, mustard greens, collard greens, cabbage, broccoli, etc. o OTHER: Asparagus, cauliflower, eggplant, carrots, peas, Brussel sprouts, tomatoes, bell peppers, zucchini, beets, cucumbers, etc. . Grains, seeds, and legumes o Beans: kidney beans, black eyed peas, garbanzo beans, black beans, pinto beans, etc. o Whole, unrefined grains: brown rice, barley, bulgur, oatmeal, etc. . Healthy fats  o Avoid highly processed fats such as vegetable oil o Examples of healthy fats: avocado, olives, virgin olive oil, dark chocolate (?72% Cocoa), nuts (peanuts, almonds, walnuts, cashews, pecans, etc.) o Please still do small amount of these healthy fats, they are dense in calories.  . Low - Moderate Intake of Animal Sources of Protein o Meat sources: chicken, [place], salmon, tuna. Limit to 4 ounces of meat at one time or the size of your palm. o Consider limiting dairy sources, but when choosing dairy focus on: PLAIN [place] yogurt, cottage cheese, high-protein milk . Fruit o Choose berries

## 2019-07-18 ENCOUNTER — Other Ambulatory Visit: Payer: Self-pay | Admitting: Physician Assistant

## 2019-07-25 DIAGNOSIS — R350 Frequency of micturition: Secondary | ICD-10-CM | POA: Diagnosis not present

## 2019-07-25 DIAGNOSIS — N401 Enlarged prostate with lower urinary tract symptoms: Secondary | ICD-10-CM | POA: Diagnosis not present

## 2019-07-25 DIAGNOSIS — N3281 Overactive bladder: Secondary | ICD-10-CM | POA: Diagnosis not present

## 2019-08-18 ENCOUNTER — Ambulatory Visit: Payer: Medicare Other | Admitting: Physician Assistant

## 2019-08-20 NOTE — Progress Notes (Signed)
Diabetes Education and Follow-Up Visit  73 y.o.male presents for diabetic education. He has Diabetes Mellitus type 2:with CKD, with hyperlipidemia he is on bASA, and denies polydipsia, polyuria and visual disturbances. Last visit his crestor was increase to 20 mg daily to try to reach goal of 70.  His metformin was decreased to 2 pills a day due to his GFR worsening and we may need to discontinue. He was also referred to nephrology.  We discussed adding on an insulin pending what his sugars were this visit.  Tried to get freestyle libre for the patient but it was too expensive  Last hemoglobin A1c was: Lab Results  Component Value Date   HGBA1C 9.4 (H) 06/18/2019   HGBA1C 8.8 (H) 03/17/2019   HGBA1C 9.4 (H) 12/12/2018    Body mass index is 35.51 kg/m.  Pt is on a regimen of: Metformin 4 a day need to cut back due to kidney function being below 45 to 2 a day Glipizide 5 mg 2 in the am and 1 at night  Pt checks his sugars  His sugar this AM was 223 His sugars before lunch are right around 200, lowest is 192 Highest has been 230 Glucometer: freestyle- insurance will not cover free style libre.   Exercise: has walking last few weeks  Patient does have CKD He is on ACE/ARB Lab Results  Component Value Date   GFRNONAA 39 (L) 06/18/2019    Lab Results  Component Value Date   CREATININE 1.73 (H) 06/18/2019   BUN 27 (H) 06/18/2019   NA 140 06/18/2019   K 4.6 06/18/2019   CL 103 06/18/2019   CO2 26 06/18/2019    Lab Results  Component Value Date   MICROALBUR 0.4 12/12/2018    He is on a Statin. Crestor 10,  increased last visit to 20mg   He is not at goal of less than 70.  Lab Results  Component Value Date   CHOL 165 06/18/2019   HDL 25 (L) 06/18/2019   LDLCALC 95 06/18/2019   TRIG 400 (H) 06/18/2019   CHOLHDL 6.6 (H) 06/18/2019     Problem List has Hyperlipidemia associated with type 2 diabetes mellitus ([place]); Testosterone deficiency; Gout; OSA (obstructive  sleep apnea); Essential hypertension; Type 2 diabetes mellitus with stage 3 chronic kidney disease, without long-term current use of insulin ([place]); Vitamin D deficiency; Medication management; BPH/Prostatism; Obesity (BMI 30.0-34.9); GERD (gastroesophageal reflux disease); Anxiety; and CKD stage 3 due to type 2 diabetes mellitus ([place]) on their problem list.  Medications . Current Outpatient Medications (Endocrine & Metabolic):  .  glipiZIDE (GLUCOTROL) 5 MG tablet, TAKE 1 TABLET BY MOUTH  TWICE A DAY WITH MEALS FOR  DIABETES .  metFORMIN (GLUCOPHAGE-XR) 500 MG 24 hr tablet, Take 2 tablets a day with food, decrease due to kidney function  Current Outpatient Medications (Cardiovascular):  .  fenofibrate 160 MG tablet, TAKE 1 TABLET BY MOUTH  DAILY FOR BLOOD FATS .  rosuvastatin (CRESTOR) 20 MG tablet, Take 1 tablet (20 mg total) by mouth daily. .  valsartan-hydrochlorothiazide (DIOVAN-HCT) 320-25 MG tablet, Take 1 tablet Daily for BP  Current Outpatient Medications (Respiratory):  .  fluticasone (FLONASE) 50 MCG/ACT nasal spray, Use 2 sprays each nostril  /daily  Current Outpatient Medications (Analgesics):  .  acetaminophen (TYLENOL) 325 MG tablet, Take 650 mg by mouth every 6 (six) hours as needed for mild pain.   Current Outpatient Medications (Other):  Marland Kitchen  ALPRAZolam (XANAX) 1 MG tablet, Take 1/2-1 tablet 2 -  3 x /day ONLY if needed for Anxiety Attack &  limit to 5 days /week to avoid addiction & Do not take while Driving .  Cholecalciferol (VITAMIN D3) 10000 units capsule, Take 10,000 Units by mouth daily. .  Continuous Blood Gluc Sensor (FREESTYLE LIBRE SENSOR SYSTEM) MISC, Check sugars 3 x a day DX E10.9 .  glucose blood (FREESTYLE LITE) test strip, Check blood sugar 1 time daily-DX-E11.22 .  mupirocin ointment (BACTROBAN) 2 %, APPLY TWICE A DAY TO SKIN INFECTION. Marland Kitchen  omeprazole (PRILOSEC) 40 MG capsule, TAKE 1 CAPSULE BY MOUTH  DAILY FOR ACID REFLUX .  oxybutynin (DITROPAN) 5 MG  tablet, Take 1 tablet 3 x /day  For Bladder Control .  tamsulosin (FLOMAX) 0.4 MG CAPS capsule, Take 1 capsule (0.4 mg total) by mouth daily.  ROS- see HPI  Physical Exam: Blood pressure 122/80, pulse 78, temperature (!) 97.3 F (36.3 C), weight 251 lb (113.9 kg), SpO2 96 %. Body mass index is 35.51 kg/m. General Appearance: Well nourished, in no apparent distress. Eyes: PERRLA, EOMs, conjunctiva no swelling or erythema ENT/Mouth: Ext aud canals clear, TMs without erythema, bulging. No erythema, swelling, or exudate on post pharynx.  Tonsils not swollen or erythematous. Hearing normal.  Respiratory: Respiratory effort normal, BS equal bilaterally without rales, rhonchi, wheezing or stridor.  Cardio: RRR with no MRGs. Brisk peripheral pulses without edema.  Abdomen: Soft, + BS.  Non tender, no guarding, rebound, hernias, masses. Musculoskeletal: Full ROM, 5/5 strength, normal gait.  Skin: Warm, dry without rashes, lesions, ecchymosis.  Neuro: Cranial nerves intact. Normal muscle tone, no cerebellar symptoms.    Plan and Assessment: Bishara was seen today for follow-up.  CKD stage 3 due to type 2 diabetes mellitus ([facility]) Decreased metformin due to recent GFR, referred to nephrology- appreciate their input, patient given their number to call to set up appointment, will get PTH next OV and monitor   Hyperlipidemia associated with type 2 diabetes mellitus ([place]) Will increase glipizide to max dose of 20 mg a day, 5mg  pill 2 in the AM and 2 at night with food  LONG discussion with the patient in regards to disease process and at this time he may need to be switched to insulin, he would like to continue diet, increase the glipizide and see how is A1C is next OV.  We will likely need to switch to insulin/70/30 but will give the patient time since he is very motivated.  Patient understands risk of MI, stroke, blindness, kidney failure, death etc from uncontrolled DM  Type 2 diabetes mellitus  with stage 3 chronic kidney disease, without long-term current use of insulin, unspecified whether stage 3a or 3b CKD ([place]) Will increase glipizide to max dose of 20 mg a day, 5mg  pill 2 in the AM and 2 at night with food  LONG discussion with the patient in regards to disease process and at this time he may need to be switched to insulin, he would like to continue diet, increase the glipizide and see how is A1C is next OV.  We will likely need to switch to insulin/70/30 but will give the patient time since he is very motivated.  Patient understands risk of MI, stroke, blindness, kidney failure, death etc from uncontrolled DM   Future Appointments  Date Time Provider [place]  09/18/2019 10:45 AM Vicie Mutters, PA-C [facility] None  01/15/2020 10:00 AM Unk Pinto, MD [facility] None

## 2019-08-25 ENCOUNTER — Encounter: Payer: Self-pay | Admitting: Physician Assistant

## 2019-08-25 ENCOUNTER — Ambulatory Visit (INDEPENDENT_AMBULATORY_CARE_PROVIDER_SITE_OTHER): Payer: Medicare Other | Admitting: Physician Assistant

## 2019-08-25 ENCOUNTER — Other Ambulatory Visit: Payer: Self-pay

## 2019-08-25 VITALS — BP 122/80 | HR 78 | Temp 97.3°F | Wt 251.0 lb

## 2019-08-25 DIAGNOSIS — E785 Hyperlipidemia, unspecified: Secondary | ICD-10-CM

## 2019-08-25 DIAGNOSIS — N183 Chronic kidney disease, stage 3 unspecified: Secondary | ICD-10-CM | POA: Diagnosis not present

## 2019-08-25 DIAGNOSIS — E1122 Type 2 diabetes mellitus with diabetic chronic kidney disease: Secondary | ICD-10-CM | POA: Diagnosis not present

## 2019-08-25 DIAGNOSIS — E1169 Type 2 diabetes mellitus with other specified complication: Secondary | ICD-10-CM | POA: Diagnosis not present

## 2019-08-25 NOTE — Patient Instructions (Signed)
Call nephrology/kidney doctor- Dr. Lorrene Reid- Phone: [phone];  Increase glipizide to 10 mg or 2 pills with dinner Continue the 2 pills or 10 mg with breakfast 20 mg is the max dose  Continue diet and walking If sugars are not improving we will discuss starting insulin  PLEASE MONITOR YOUR SUGARS AT HOME KEEP A LOG IMPORTANT TO CHECK SUGARS AT LEAST ONCE A DAY BUT I PREFER TWICE A DAY ONCE IN THE MORNING AND ONCE 2 HOURS AFTER EATING     Bad carbs also include fruit juice, alcohol, and sweet tea. These are empty calories that do not signal to your brain that you are full.   Please remember the good carbs are still carbs which convert into sugar. So please measure them out no more than 1/2-1 cup of rice, oatmeal, pasta, and beans  Veggies are however free foods! Pile them on.   Not all fruit is created equal. Please see the list below, the fruit at the bottom is higher in sugars than the fruit at the top. Please avoid all dried fruits.     RANGE OF A1C   Your A1C is a measure of your sugar over the past 3 months and is not affected by what you have eaten over the past few days. Diabetes increases your chances of stroke and heart attack over 300 % and is the leading cause of blindness and kidney failure in the [place]. Please make sure you decrease bad carbs like white bread, white rice, potatoes, corn, soft drinks, pasta, cereals, refined sugars, sweet tea, dried fruits, and fruit juice. Good carbs are okay to eat in moderation like sweet potatoes, brown rice, whole grain pasta/bread, most fruit (except dried fruit) and you can eat as many veggies as you want.   Greater than 6.5 is considered diabetic. Between 6.4 and 5.7 is prediabetic If your A1C is less than 5.7 you are NOT diabetic.  Targets for Glucose Readings: Time of Check Target for patients WITHOUT Diabetes Target for DIABETICS  Before Meals Less than 100  less than 150  Two hours after meals Less than 200  Less  than 250

## 2019-09-15 ENCOUNTER — Ambulatory Visit (INDEPENDENT_AMBULATORY_CARE_PROVIDER_SITE_OTHER): Payer: Medicare Other | Admitting: Orthopaedic Surgery

## 2019-09-15 ENCOUNTER — Encounter: Payer: Self-pay | Admitting: Orthopaedic Surgery

## 2019-09-15 ENCOUNTER — Other Ambulatory Visit: Payer: Self-pay

## 2019-09-15 DIAGNOSIS — M25552 Pain in left hip: Secondary | ICD-10-CM

## 2019-09-15 NOTE — Progress Notes (Signed)
Office Visit Note   Patient: Jeffery Reed           Date of Birth: 25-Oct-1945           MRN: [id number] Visit Date: 09/15/2019              Requested by: Unk Pinto, [place] [address],  [place] [id number] PCP: Unk Pinto, MD   Assessment & Plan: Visit Diagnoses:  1. Pain in left hip     Plan: Thus far his signs and symptoms seem to correlate more with trochanteric bursitis and IT band syndrome.  I would like him to try the Voltaren gel 2-3 times daily.  I would also like to send him to formal outpatient physical therapy to try various modalities to help decrease his symptoms.  Right now, since his blood glucose has been running high I would not recommend a steroid injection.  He is seeing I believe his primary care physician Thursday of this week.  If his blood glucose is getting under better control we can consider a steroid injection we see him back in 4 weeks.  All question concerns were answered and addressed.  Follow-Up Instructions: Return in about 4 weeks (around 10/13/2019).   Orders:  No orders of the defined types were placed in this encounter.  No orders of the defined types were placed in this encounter.     Procedures: No procedures performed   Clinical Data: No additional findings.   Subjective: Chief Complaint  Patient presents with  . Left Hip - Pain  . Lower Back - Pain  The patient is someone I am seeing for the first time as a patient but his wife is a patient of ours.  He comes in today with mainly left hip pain.  He points to the IT band and the trochanteric area of the lateral left hip.  He denies any groin pain he denies any radicular symptoms.  He is a diabetic which she said is borderline however I see his hemoglobin A1c just a few months ago was over 9.  He is on 2 different diabetic medications.  He also has stage III kidney disease so he cannot take anti-inflammatories.  He says he just bought some Voltaren gel  this weekend but is not really tried it on this area.  He denies any injuries.  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  He says is worse when he is putting pressure on it.  It does not bother him when he is driving.  He states his blood glucose just recently was 150 when he checked it 1 morning.  He does see a specialist on Thursday this week to assess his blood glucose.  HPI  Review of Systems He currently denies any headache, chest pain, shortness of breath, fever, chills, nausea, vomiting  Objective: Vital Signs: There were no vitals taken for this visit.  Physical Exam He is alert and orient x3 and in no acute distress Ortho Exam He walks without an assistive device or a significant limp at all.  Both hips have fluid and full range of motion both sitting up and supine.  There is no pain in the groin at all.  He does hurt to palpation over the trochanteric area and IT band on the left side.  His motor and sensory exam seem normal otherwise.  Is good good strength in his legs.  We did not x-ray anything today. Specialty Comments:  No specialty comments  available.  Imaging: No results found.   PMFS History: Patient Active Problem List   Diagnosis Date Noted  . CKD stage 3 due to type 2 diabetes mellitus ([place]) 03/12/2019  . Anxiety 02/18/2018  . GERD (gastroesophageal reflux disease) 03/12/2016  . Obesity (BMI 30.0-34.9) 06/01/2015  . Medication management 08/19/2014  . BPH/Prostatism 08/19/2014  . Essential hypertension 08/17/2013  . Type 2 diabetes mellitus with stage 3 chronic kidney disease, without long-term current use of insulin ([place]) 08/17/2013  . Vitamin D deficiency 08/17/2013  . Hyperlipidemia associated with type 2 diabetes mellitus ([place])   . Testosterone deficiency   . Gout   . OSA (obstructive sleep apnea)    Past Medical History:  Diagnosis Date  . Allergy   . Anxiety   . Cataract    beginning stage 01/22/2019  . Chronic kidney disease    enlarge bladder  . Diabetes  mellitus without complication ([place])   . GERD (gastroesophageal reflux disease)   . Gout   . Hyperlipidemia   . Hypertension   . OSA (obstructive sleep apnea)   . Pre-diabetes   . Sleep apnea    c pap    Family History  Problem Relation Age of Onset  . Hypertension Mother   . GER disease Mother   . Hyperlipidemia Father   . Hypertension Father   . Parkinson's disease Father   . Hypertension Brother   . Diabetes Maternal Grandmother   . Stroke Paternal Grandmother   . Diabetes Paternal Grandmother   . Stroke Paternal Grandfather   . Colon cancer Neg Hx   . Colon polyps Neg Hx   . Esophageal cancer Neg Hx   . Stomach cancer Neg Hx   . Rectal cancer Neg Hx     Past Surgical History:  Procedure Laterality Date  . COLONOSCOPY    . FOOT SURGERY  10/2009  . MYRINGOTOMY Left 02-2005   Dr.Newman  . TONSILLECTOMY  1953   Social History   Occupational History  . Not on file  Tobacco Use  . Smoking status: Never Smoker  . Smokeless tobacco: Never Used  Substance and Sexual Activity  . Alcohol use: No  . Drug use: No  . Sexual activity: Not on file

## 2019-09-16 NOTE — Progress Notes (Signed)
FOLLOW UP  Assessment and Plan:   Hypertension Well controlled with current medications  Monitor blood pressure at home; patient to call if consistently greater than 130/80 Continue DASH diet.   Reminder to go to the ER if any CP, SOB, nausea, dizziness, severe HA, changes vision/speech, left arm numbness and tingling and jaw pain.  Cholesterol Continue medications, goal LDL under 70 Continue low cholesterol diet and exercise.  Check lipid panel.   Diabetes with diabetic chronic kidney disease Continue diet and exercise.  Perform daily foot/skin check, notify office of any concerning changes.  Check A1C Stop liquid sugars Check with your insurance on ozempic and Bcise Check on basal insulin levemir and basaglar   Check on these medication cost, they would be in addition to the glipizide if your sugar is not well controlled.   Switch the glipizide to 1 pill in the morning, 1 pill at lunch, and 2 at dinner.   Vitamin D Def/ osteoporosis prevention Continue supplementation Check Vit D level  Continue diet and meds as discussed. Further disposition pending results of labs. Discussed med's effects and SE's.   Over 30 minutes of exam, counseling, chart review, and critical decision making was performed.   Future Appointments  Date Time Provider [place]  10/13/2019 10:15 AM Mcarthur Rossetti, MD [facility] None  01/15/2020 10:00 AM Unk Pinto, MD [facility] None    ----------------------------------------------------------------------------------------------------------------------  HPI 74 y.o. male  presents for 3 month follow up on hypertension, cholesterol, diabetes, GERD, gout, obesity and vitamin D deficiency.   BMI is Body mass index is 35.65 kg/m., he has not been working on diet and exercise. He could not tolerate phentermine and topamax due to side effects.  Wt Readings from Last 3 Encounters:  09/18/19 252 lb (114.3 kg)  08/25/19 251 lb (113.9 kg)   07/16/19 230 lb (104.3 kg)   His blood pressure has been controlled at home, today their BP is BP: 128/86  He does not workout. He denies chest pain, shortness of breath, dizziness.   He is on cholesterol medication, he is on crestor (recently increased) and fenofibrate and denies myalgias. His cholesterol is not at goal. The cholesterol last visit was:   Lab Results  Component Value Date   CHOL 165 06/18/2019   HDL 25 (L) 06/18/2019   [place] 95 06/18/2019   TRIG 400 (H) 06/18/2019   CHOLHDL 6.6 (H) 06/18/2019    He has not been working on diet and exercise for diabetes With CKD stage 3 on diovan hyperlipidemia on crestor, goal of less than 70 Glucotrol 5mg , he is on 2 in the AM and 2 at dinner, he will eat breakfast, lunch and dinner. Metformin 500mg  ER down to 2 pills a day due to GFR Eye exam Jan 2020 Meter: Freestyle Elenor Legato was too expensive) Sugars have been 120-150 in the AM  denies increased appetite, nausea, paresthesia of the feet, polydipsia, polyuria and visual disturbances he endorses hyperglycemia.  Last A1C in the office was:  Lab Results  Component Value Date   HGBA1C 9.4 (H) 06/18/2019   Lab Results  Component Value Date   GFRNONAA 54 (L) 06/18/2019   Patient is on high dose Vitamin D supplement Lab Results  Component Value Date   VD25OH 39 06/18/2019       Current Medications:   Current Outpatient Medications (Endocrine & Metabolic):  .  glipiZIDE (GLUCOTROL) 5 MG tablet, TAKE 1 TABLET BY MOUTH  TWICE A DAY WITH MEALS FOR  DIABETES .  metFORMIN (GLUCOPHAGE-XR) 500 MG 24 hr tablet, Take 2 tablets a day with food, decrease due to kidney function  Current Outpatient Medications (Cardiovascular):  .  fenofibrate 160 MG tablet, TAKE 1 TABLET BY MOUTH  DAILY FOR BLOOD FATS .  rosuvastatin (CRESTOR) 20 MG tablet, Take 1 tablet (20 mg total) by mouth daily. .  valsartan-hydrochlorothiazide (DIOVAN-HCT) 320-25 MG tablet, Take 1 tablet Daily for BP  Current  Outpatient Medications (Respiratory):  .  fluticasone (FLONASE) 50 MCG/ACT nasal spray, Use 2 sprays each nostril  /daily  Current Outpatient Medications (Analgesics):  .  acetaminophen (TYLENOL) 325 MG tablet, Take 650 mg by mouth every 6 (six) hours as needed for mild pain.   Current Outpatient Medications (Other):  Marland Kitchen  ALPRAZolam (XANAX) 1 MG tablet, Take 1/2-1 tablet 2 - 3 x /day ONLY if needed for Anxiety Attack &  limit to 5 days /week to avoid addiction & Do not take while Driving .  Cholecalciferol (VITAMIN D3) 10000 units capsule, Take 10,000 Units by mouth daily. .  Continuous Blood Gluc Sensor (FREESTYLE LIBRE SENSOR SYSTEM) MISC, Check sugars 3 x a day DX E10.9 .  glucose blood (FREESTYLE LITE) test strip, Check blood sugar 1 time daily-DX-E11.22 .  mupirocin ointment (BACTROBAN) 2 %, APPLY TWICE A DAY TO SKIN INFECTION. Marland Kitchen  omeprazole (PRILOSEC) 40 MG capsule, TAKE 1 CAPSULE BY MOUTH  DAILY FOR ACID REFLUX .  oxybutynin (DITROPAN) 5 MG tablet, Take 1 tablet 3 x /day  For Bladder Control .  tamsulosin (FLOMAX) 0.4 MG CAPS capsule, Take 1 capsule (0.4 mg total) by mouth daily.   Allergies:  Allergies  Allergen Reactions  . Cardura [Doxazosin Mesylate] Other (See Comments)    Nasal congestion  . Codeine Other (See Comments)    Pt passes out.  Marland Kitchen Hytrin [Terazosin]      Medical History:  Past Medical History:  Diagnosis Date  . Allergy   . Anxiety   . Cataract    beginning stage 01/22/2019  . Chronic kidney disease    enlarge bladder  . Diabetes mellitus without complication ([place])   . GERD (gastroesophageal reflux disease)   . Gout   . Hyperlipidemia   . Hypertension   . OSA (obstructive sleep apnea)   . Pre-diabetes   . Sleep apnea    c pap   Family history- Reviewed and unchanged Social history- Reviewed and unchanged   Review of Systems:  Review of Systems  Constitutional: Negative for malaise/fatigue and weight loss.  HENT: Negative for hearing loss and  tinnitus.   Eyes: Negative for blurred vision and double vision.  Respiratory: Negative for cough, sputum production, shortness of breath and wheezing.   Cardiovascular: Negative for chest pain, palpitations, orthopnea, claudication and leg swelling.  Gastrointestinal: Negative for abdominal pain, blood in stool, constipation, diarrhea, heartburn, melena, nausea and vomiting.  Genitourinary: Negative.  Negative for frequency.  Musculoskeletal: Negative for falls, joint pain and myalgias.  Skin: Negative for rash.  Neurological: Negative for dizziness, tingling, sensory change, weakness and headaches.  Endo/Heme/Allergies: Negative for polydipsia.  Psychiatric/Behavioral: Negative.   All other systems reviewed and are negative.     Physical Exam: BP 128/86   Pulse 64   Temp (!) 97.3 F (36.3 C)   Wt 252 lb (114.3 kg)   SpO2 97%   BMI 35.65 kg/m  Wt Readings from Last 3 Encounters:  09/18/19 252 lb (114.3 kg)  08/25/19 251 lb (113.9 kg)  07/16/19 230 lb (104.3 kg)  General Appearance: Well nourished, in no apparent distress. Eyes: PERRLA, EOMs, conjunctiva no swelling or erythema Sinuses: No Frontal/maxillary tenderness ENT/Mouth: Ext aud canals clear, TMs without erythema, bulging. No erythema, swelling, or exudate on post pharynx.  Tonsils not swollen or erythematous. Hearing normal.  Neck: Supple, thyroid normal.  Respiratory: Respiratory effort normal, BS equal bilaterally without rales, rhonchi, wheezing or stridor.  Cardio: RRR with no MRGs. Brisk peripheral pulses without edema.  Abdomen: Soft, + BS.  Non tender, no guarding, rebound, hernias, masses. Lymphatics: Non tender without lymphadenopathy.  Musculoskeletal: Full ROM, 5/5 strength, Normal gait Skin: Warm, dry without rashes, lesions, ecchymosis.  Neuro: Cranial nerves intact. No cerebellar symptoms.  Psych: Awake and oriented X 3, normal affect, Insight and Judgment appropriate.    Vicie Mutters,  PA-C 11:25 AM [facility]

## 2019-09-18 ENCOUNTER — Other Ambulatory Visit: Payer: Self-pay

## 2019-09-18 ENCOUNTER — Ambulatory Visit (INDEPENDENT_AMBULATORY_CARE_PROVIDER_SITE_OTHER): Payer: Medicare Other | Admitting: Physician Assistant

## 2019-09-18 ENCOUNTER — Encounter: Payer: Self-pay | Admitting: Physician Assistant

## 2019-09-18 VITALS — BP 128/86 | HR 64 | Temp 97.3°F | Wt 252.0 lb

## 2019-09-18 DIAGNOSIS — Z79899 Other long term (current) drug therapy: Secondary | ICD-10-CM | POA: Diagnosis not present

## 2019-09-18 DIAGNOSIS — E785 Hyperlipidemia, unspecified: Secondary | ICD-10-CM | POA: Diagnosis not present

## 2019-09-18 DIAGNOSIS — I10 Essential (primary) hypertension: Secondary | ICD-10-CM

## 2019-09-18 DIAGNOSIS — E1169 Type 2 diabetes mellitus with other specified complication: Secondary | ICD-10-CM

## 2019-09-18 DIAGNOSIS — E1122 Type 2 diabetes mellitus with diabetic chronic kidney disease: Secondary | ICD-10-CM

## 2019-09-18 DIAGNOSIS — E669 Obesity, unspecified: Secondary | ICD-10-CM | POA: Diagnosis not present

## 2019-09-18 DIAGNOSIS — N183 Chronic kidney disease, stage 3 unspecified: Secondary | ICD-10-CM

## 2019-09-18 DIAGNOSIS — E559 Vitamin D deficiency, unspecified: Secondary | ICD-10-CM | POA: Diagnosis not present

## 2019-09-18 NOTE — Patient Instructions (Signed)
Check with your insurance on ozempic and Bcise Check on basal insulin levemir and basaglar   Check on these medication cost, they would be in addition to the glipizide if your sugar is not well controlled.   Switch the glipizide to 1 pill in the morning, 1 pill at lunch, and 2 at dinner.   General eating tips  What to Avoid . Avoid added sugars o Often added sugar can be found in processed foods such as many condiments, dry cereals, cakes, cookies, chips, crisps, crackers, candies, sweetened drinks, etc.  o Read labels and AVOID/DECREASE use of foods with the following in their ingredient list: Sugar, fructose, high fructose corn syrup, sucrose, glucose, maltose, dextrose, molasses, cane sugar, brown sugar, any type of syrup, agave nectar, etc.   . Avoid snacking in between meals- drink water or if you feel you need a snack, pick a high water content snack such as cucumbers, watermelon, or any veggie.  Marland Kitchen Avoid foods made with flour o If you are going to eat food made with flour, choose those made with whole-grains; and, minimize your consumption as much as is tolerable . Avoid processed foods o These foods are generally stocked in the middle of the grocery store.  o Focus on shopping on the perimeter of the grocery.  What to Include . Vegetables o GREEN LEAFY VEGETABLES: Kale, spinach, mustard greens, collard greens, cabbage, broccoli, etc. o OTHER: Asparagus, cauliflower, eggplant, carrots, peas, Brussel sprouts, tomatoes, bell peppers, zucchini, beets, cucumbers, etc. . Grains, seeds, and legumes o Beans: kidney beans, black eyed peas, garbanzo beans, black beans, pinto beans, etc. o Whole, unrefined grains: brown rice, barley, bulgur, oatmeal, etc. . Healthy fats  o Avoid highly processed fats such as vegetable oil o Examples of healthy fats: avocado, olives, virgin olive oil, dark chocolate (?72% Cocoa), nuts (peanuts, almonds, walnuts, cashews, pecans, etc.) o Please still do small  amount of these healthy fats, they are dense in calories.  . Low - Moderate Intake of Animal Sources of Protein o Meat sources: chicken, [place], salmon, tuna. Limit to 4 ounces of meat at one time or the size of your palm. o Consider limiting dairy sources, but when choosing dairy focus on: PLAIN [place] yogurt, cottage cheese, high-protein milk . Fruit o Choose berries

## 2019-09-19 ENCOUNTER — Other Ambulatory Visit: Payer: Self-pay | Admitting: Physician Assistant

## 2019-09-19 DIAGNOSIS — E1169 Type 2 diabetes mellitus with other specified complication: Secondary | ICD-10-CM

## 2019-09-19 LAB — COMPLETE METABOLIC PANEL WITH GFR
AG Ratio: 2.3 (calc) (ref 1.0–2.5)
ALT: 14 U/L (ref 9–46)
AST: 21 U/L (ref 10–35)
Albumin: 4.8 g/dL (ref 3.6–5.1)
Alkaline phosphatase (APISO): 42 U/L (ref 35–144)
BUN/Creatinine Ratio: 17 (calc) (ref 6–22)
BUN: 26 mg/dL — ABNORMAL HIGH (ref 7–25)
CO2: 29 mmol/L (ref 20–32)
Calcium: 10 mg/dL (ref 8.6–10.3)
Chloride: 105 mmol/L (ref 98–110)
Creat: 1.55 mg/dL — ABNORMAL HIGH (ref 0.70–1.18)
GFR, Est African American: 51 mL/min/{1.73_m2} — ABNORMAL LOW (ref >=60)
GFR, Est Non African American: 44 mL/min/{1.73_m2} — ABNORMAL LOW (ref >=60)
Globulin: 2.1 g/dL (calc) (ref 1.9–3.7)
Glucose, Bld: 115 mg/dL — ABNORMAL HIGH (ref 65–99)
Potassium: 4.2 mmol/L (ref 3.5–5.3)
Sodium: 143 mmol/L (ref 135–146)
Total Bilirubin: 0.5 mg/dL (ref 0.2–1.2)
Total Protein: 6.9 g/dL (ref 6.1–8.1)

## 2019-09-19 LAB — CBC WITH DIFFERENTIAL/PLATELET
Absolute Monocytes: 621 cells/uL (ref 200–950)
Basophils Absolute: 58 cells/uL (ref 0–200)
Basophils Relative: 0.8 %
Eosinophils Absolute: 219 cells/uL (ref 15–500)
Eosinophils Relative: 3 %
HCT: 41.1 % (ref 38.5–50.0)
Hemoglobin: 14.3 g/dL (ref 13.2–17.1)
Lymphs Abs: 1847 cells/uL (ref 850–3900)
MCH: 30.2 pg (ref 27.0–33.0)
MCHC: 34.8 g/dL (ref 32.0–36.0)
MCV: 86.7 fL (ref 80.0–100.0)
MPV: 10.4 fL (ref 7.5–12.5)
Monocytes Relative: 8.5 %
Neutro Abs: 4555 cells/uL (ref 1500–7800)
Neutrophils Relative %: 62.4 %
Platelets: 197 10*3/uL (ref 140–400)
RBC: 4.74 10*6/uL (ref 4.20–5.80)
RDW: 12.9 % (ref 11.0–15.0)
Total Lymphocyte: 25.3 %
WBC: 7.3 10*3/uL (ref 3.8–10.8)

## 2019-09-19 LAB — MAGNESIUM: Magnesium: 2 mg/dL (ref 1.5–2.5)

## 2019-09-19 LAB — HEMOGLOBIN A1C
Hgb A1c MFr Bld: 7.5 % of total Hgb — ABNORMAL HIGH (ref <5.7)
Mean Plasma Glucose: 169 (calc)
eAG (mmol/L): 9.3 (calc)

## 2019-09-19 LAB — TSH: TSH: 1.49 mIU/L (ref 0.40–4.50)

## 2019-09-19 LAB — LIPID PANEL
Cholesterol: 153 mg/dL (ref <200)
HDL: 30 mg/dL — ABNORMAL LOW (ref >=40)
LDL Cholesterol (Calc): 90 mg/dL (calc)
Non-HDL Cholesterol (Calc): 123 mg/dL (calc) (ref <130)
Total CHOL/HDL Ratio: 5.1 (calc) — ABNORMAL HIGH (ref <5.0)
Triglycerides: 236 mg/dL — ABNORMAL HIGH (ref <150)

## 2019-09-19 LAB — VITAMIN D 25 HYDROXY (VIT D DEFICIENCY, FRACTURES): Vit D, 25-Hydroxy: 37 ng/mL (ref 30–100)

## 2019-09-19 MED ORDER — ROSUVASTATIN CALCIUM 40 MG PO TABS: 20.0000 mg | ORAL_TABLET | Freq: Every day | ORAL | 1 refills | Status: DC

## 2019-09-23 DIAGNOSIS — M25552 Pain in left hip: Secondary | ICD-10-CM | POA: Diagnosis not present

## 2019-09-30 DIAGNOSIS — M25552 Pain in left hip: Secondary | ICD-10-CM | POA: Diagnosis not present

## 2019-10-02 DIAGNOSIS — M25552 Pain in left hip: Secondary | ICD-10-CM | POA: Diagnosis not present

## 2019-10-07 DIAGNOSIS — M25552 Pain in left hip: Secondary | ICD-10-CM | POA: Diagnosis not present

## 2019-10-09 DIAGNOSIS — M25552 Pain in left hip: Secondary | ICD-10-CM | POA: Diagnosis not present

## 2019-10-13 ENCOUNTER — Ambulatory Visit: Payer: Medicare Other | Admitting: Orthopaedic Surgery

## 2019-10-17 DIAGNOSIS — M25552 Pain in left hip: Secondary | ICD-10-CM | POA: Diagnosis not present

## 2019-10-21 DIAGNOSIS — M25552 Pain in left hip: Secondary | ICD-10-CM | POA: Diagnosis not present

## 2019-10-23 DIAGNOSIS — M25552 Pain in left hip: Secondary | ICD-10-CM | POA: Diagnosis not present

## 2019-10-28 DIAGNOSIS — M25552 Pain in left hip: Secondary | ICD-10-CM | POA: Diagnosis not present

## 2019-10-30 DIAGNOSIS — M25552 Pain in left hip: Secondary | ICD-10-CM | POA: Diagnosis not present

## 2019-11-03 DIAGNOSIS — M25552 Pain in left hip: Secondary | ICD-10-CM | POA: Diagnosis not present

## 2019-11-04 ENCOUNTER — Ambulatory Visit (INDEPENDENT_AMBULATORY_CARE_PROVIDER_SITE_OTHER): Payer: Medicare Other | Admitting: Orthopaedic Surgery

## 2019-11-04 ENCOUNTER — Other Ambulatory Visit: Payer: Self-pay

## 2019-11-04 ENCOUNTER — Encounter: Payer: Self-pay | Admitting: Orthopaedic Surgery

## 2019-11-04 DIAGNOSIS — M25552 Pain in left hip: Secondary | ICD-10-CM | POA: Diagnosis not present

## 2019-11-04 NOTE — Progress Notes (Signed)
The patient comes in today after having physical therapy on his left hip trochanteric area and IT band.  He says physical therapy is really helped quite a bit he is made good progress.  We have held off on a steroid injection because his but daily blood sugars run between 150 and 200.  He feels like he is doing much better having gone through physical therapy.  He also uses Voltaren gel.  On examination of his left hip and knee his examination is almost normal.  He has good range of motion of both his left hip and his left knee.  There is no significant pain over the trochanteric area but is now over the distal third of the IT band.  Physical therapy would like to extend him for 4 more weeks and I think this is absolutely reasonable since he is made such good progress.  All question concerns were answered addressed.  Follow-up can be as needed.

## 2019-11-06 DIAGNOSIS — M25552 Pain in left hip: Secondary | ICD-10-CM | POA: Diagnosis not present

## 2019-11-11 DIAGNOSIS — M25552 Pain in left hip: Secondary | ICD-10-CM | POA: Diagnosis not present

## 2019-11-13 DIAGNOSIS — M25552 Pain in left hip: Secondary | ICD-10-CM | POA: Diagnosis not present

## 2019-11-18 DIAGNOSIS — M25552 Pain in left hip: Secondary | ICD-10-CM | POA: Diagnosis not present

## 2019-11-20 DIAGNOSIS — M25552 Pain in left hip: Secondary | ICD-10-CM | POA: Diagnosis not present

## 2019-11-25 DIAGNOSIS — M25552 Pain in left hip: Secondary | ICD-10-CM | POA: Diagnosis not present

## 2019-11-27 DIAGNOSIS — M25552 Pain in left hip: Secondary | ICD-10-CM | POA: Diagnosis not present

## 2019-12-02 DIAGNOSIS — M25552 Pain in left hip: Secondary | ICD-10-CM | POA: Diagnosis not present

## 2019-12-04 DIAGNOSIS — M25552 Pain in left hip: Secondary | ICD-10-CM | POA: Diagnosis not present

## 2019-12-09 DIAGNOSIS — M25552 Pain in left hip: Secondary | ICD-10-CM | POA: Diagnosis not present

## 2019-12-10 ENCOUNTER — Other Ambulatory Visit: Payer: Self-pay | Admitting: Internal Medicine

## 2019-12-10 DIAGNOSIS — N400 Enlarged prostate without lower urinary tract symptoms: Secondary | ICD-10-CM | POA: Diagnosis not present

## 2019-12-10 DIAGNOSIS — N1832 Chronic kidney disease, stage 3b: Secondary | ICD-10-CM | POA: Diagnosis not present

## 2019-12-10 DIAGNOSIS — I129 Hypertensive chronic kidney disease with stage 1 through stage 4 chronic kidney disease, or unspecified chronic kidney disease: Secondary | ICD-10-CM | POA: Diagnosis not present

## 2019-12-10 DIAGNOSIS — E559 Vitamin D deficiency, unspecified: Secondary | ICD-10-CM | POA: Diagnosis not present

## 2019-12-10 DIAGNOSIS — E1122 Type 2 diabetes mellitus with diabetic chronic kidney disease: Secondary | ICD-10-CM

## 2019-12-10 MED ORDER — GLIPIZIDE 5 MG PO TABS: ORAL_TABLET | ORAL | 0 refills | Status: DC

## 2019-12-11 ENCOUNTER — Other Ambulatory Visit: Payer: Self-pay | Admitting: Nephrology

## 2019-12-11 DIAGNOSIS — M25552 Pain in left hip: Secondary | ICD-10-CM | POA: Diagnosis not present

## 2019-12-11 DIAGNOSIS — N1832 Chronic kidney disease, stage 3b: Secondary | ICD-10-CM

## 2019-12-15 ENCOUNTER — Ambulatory Visit
Admission: RE | Admit: 2019-12-15 | Discharge: 2019-12-15 | Disposition: A | Discharge status: Home / Self Care | Payer: Medicare Other | Source: Ambulatory Visit | Attending: Nephrology | Admitting: Nephrology

## 2019-12-15 DIAGNOSIS — N1832 Chronic kidney disease, stage 3b: Secondary | ICD-10-CM

## 2019-12-15 DIAGNOSIS — N189 Chronic kidney disease, unspecified: Secondary | ICD-10-CM | POA: Diagnosis not present

## 2019-12-15 IMAGING — US US RENAL
1 series · 14 of 25 positions shown · non-contrast
Comparison: None.

CLINICAL DATA: Chronic kidney disease

EXAM:
RENAL / URINARY TRACT ULTRASOUND COMPLETE

[Series 1: us renal · 0.25mm/px · 14 of 40 slices shown]
[im 1/40]
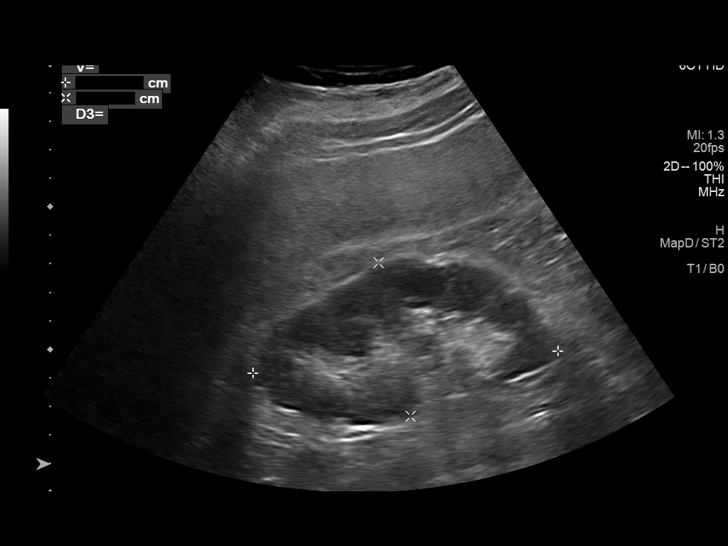
[im 4/40]
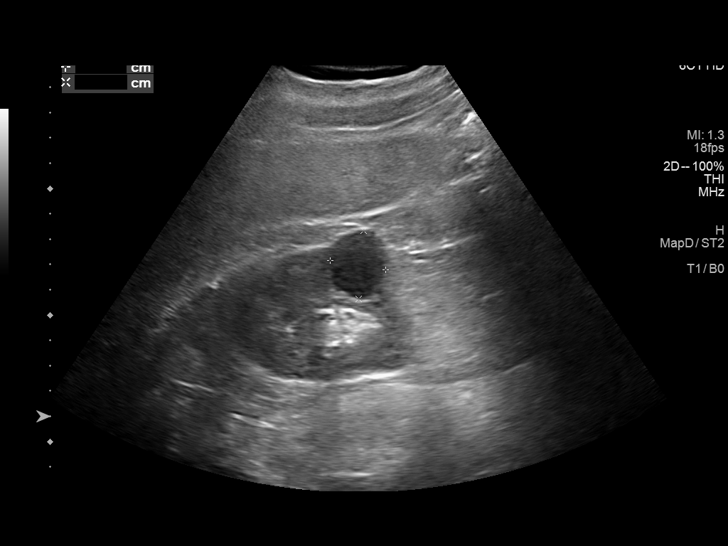
[im 7/40]
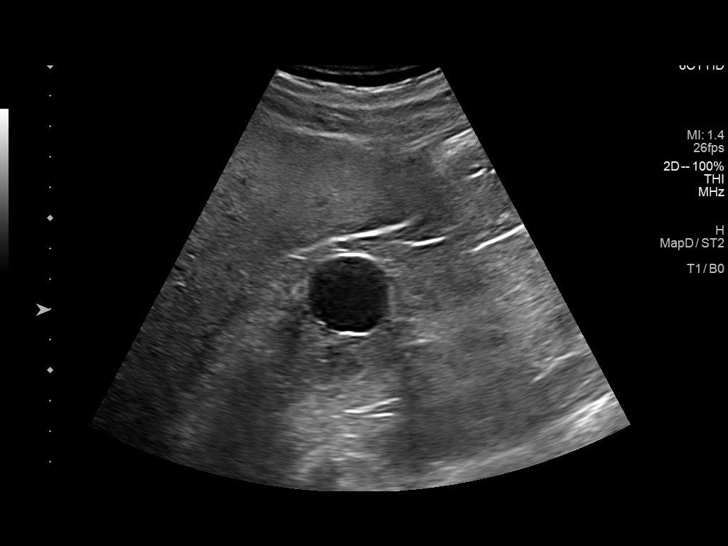
[im 10/40]
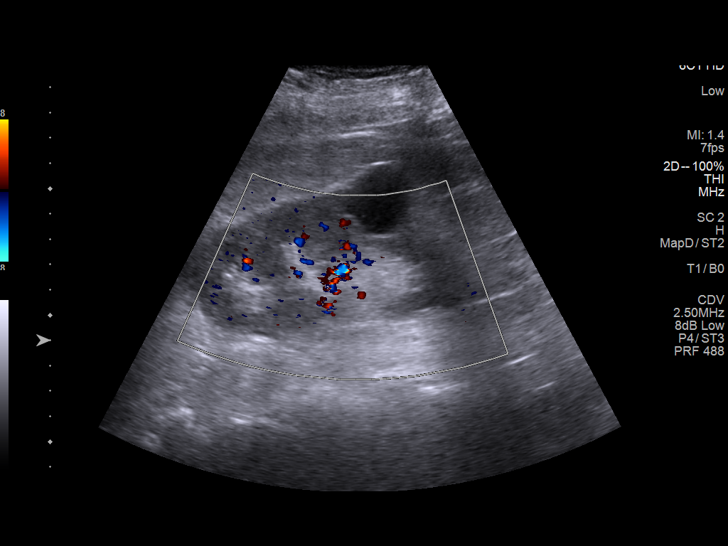
[im 14/40]
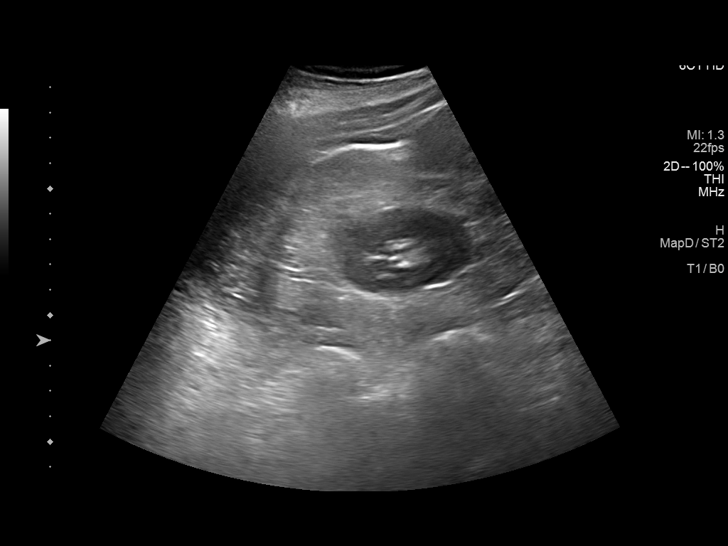
[im 15/40]
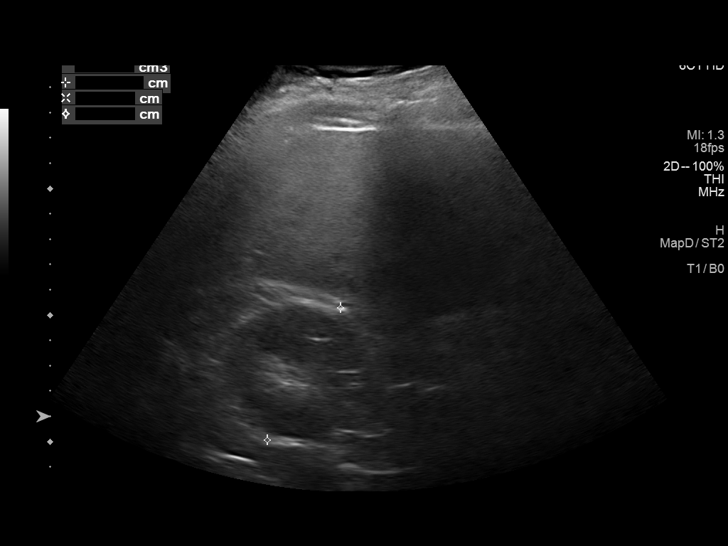
[im 18/40]
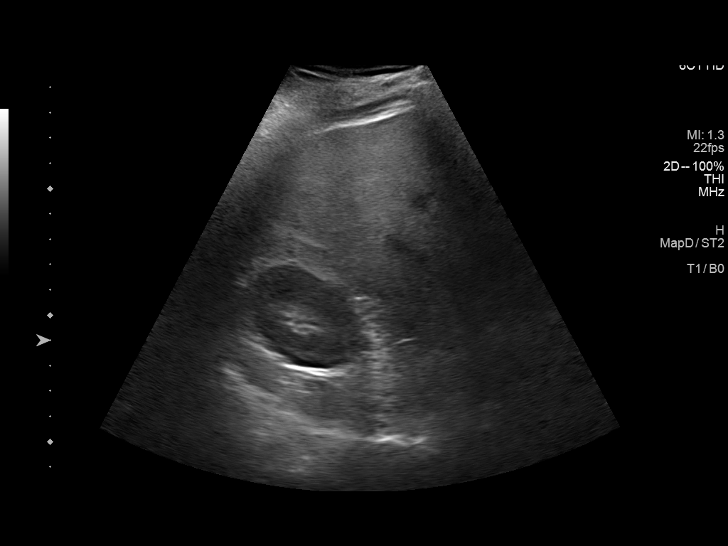
[im 22/40]
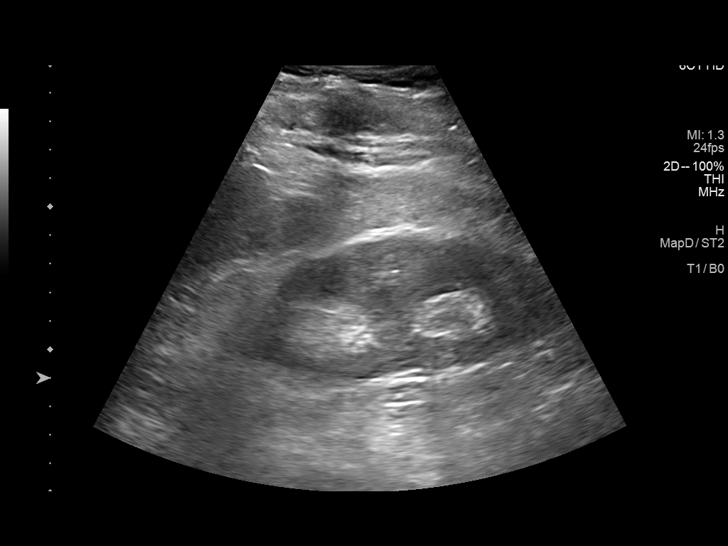
[im 25/40]
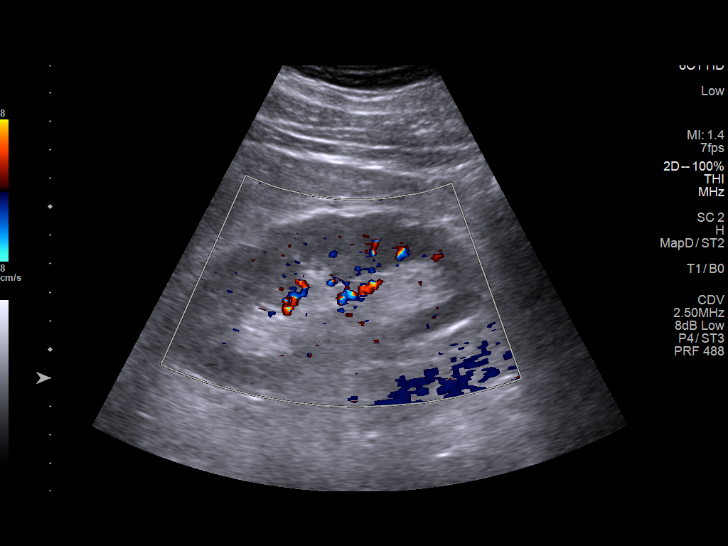
[im 27/40]
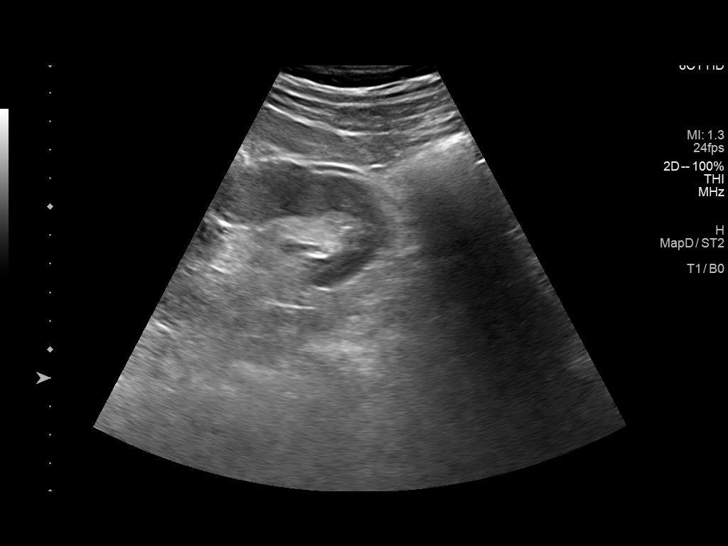
[im 30/40]
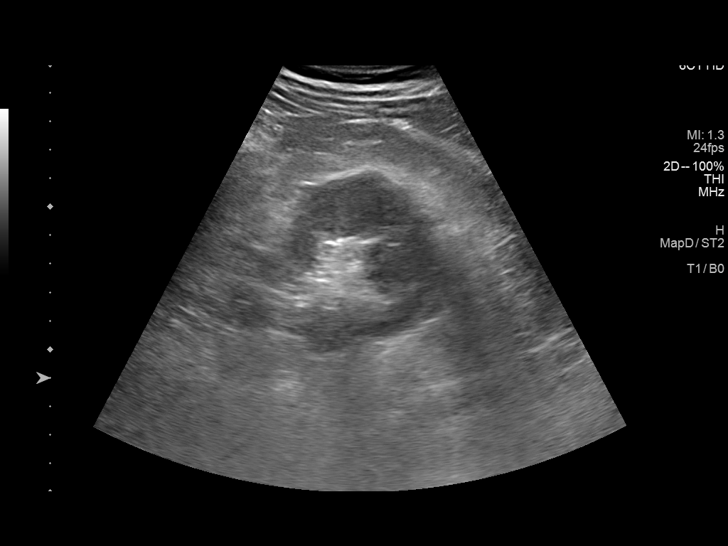
[im 33/40]
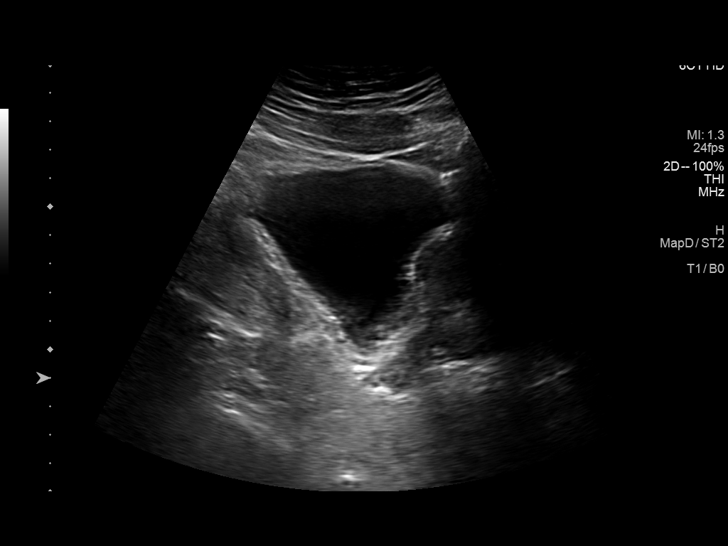
[im 36/40]
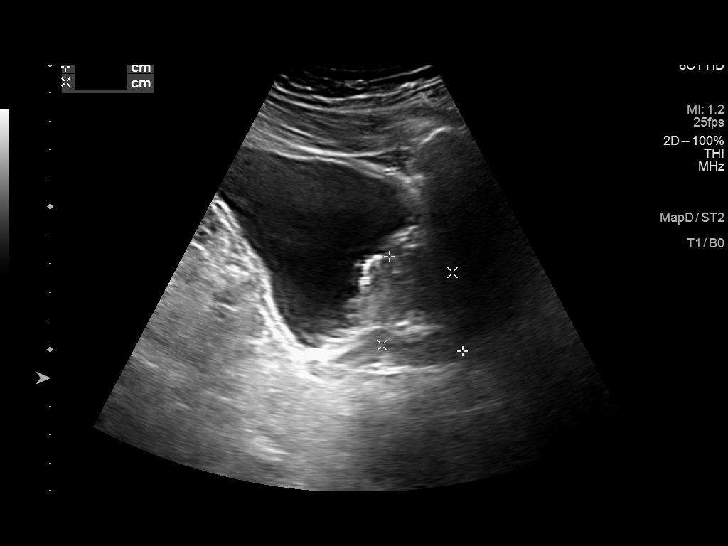
[im 40/40]
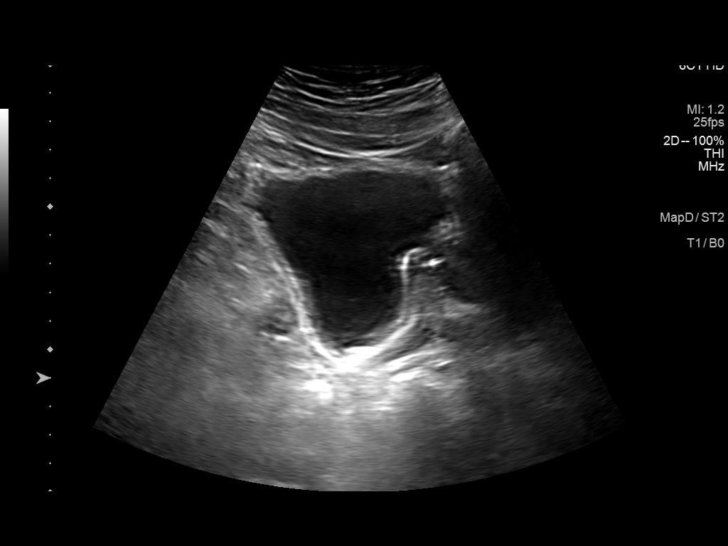

[14 of 25 positions shown; findings below may reference images not displayed]

FINDINGS: Right Kidney:

Renal measurements: 10.7 x 5.5 x 6 cm = volume: 184.3 mL. Cortical
echogenicity is normal. No hydronephrosis. 2.2 x 2.7 x 2.4 cm
midpole hypoechoic lesion.

Left Kidney:

Renal measurements: 11.4 x 5.1 x 5.5 cm = volume: 169.2 mL.
Echogenicity within normal limits. No mass or hydronephrosis
visualized.

Bladder:

Appears normal for degree of bladder distention.

Other:

Prostate appears enlarged with mass effect on the bladder.
IMPRESSION: 1. Negative for hydronephrosis.
2. 2.7 cm hypoechoic lesion mid to lower right kidney suggesting
complex cyst. Further evaluation with renal CT or MRI could be
obtained if desired, otherwise suggest sonographic follow-up in 6
months to evaluate for change or stability.

## 2019-12-17 ENCOUNTER — Other Ambulatory Visit: Payer: Self-pay

## 2019-12-17 ENCOUNTER — Encounter: Payer: Self-pay | Admitting: Orthopaedic Surgery

## 2019-12-17 ENCOUNTER — Ambulatory Visit (INDEPENDENT_AMBULATORY_CARE_PROVIDER_SITE_OTHER): Payer: Medicare Other

## 2019-12-17 ENCOUNTER — Ambulatory Visit (INDEPENDENT_AMBULATORY_CARE_PROVIDER_SITE_OTHER): Payer: Medicare Other | Admitting: Orthopaedic Surgery

## 2019-12-17 DIAGNOSIS — M25552 Pain in left hip: Secondary | ICD-10-CM | POA: Diagnosis not present

## 2019-12-17 NOTE — Progress Notes (Signed)
Office Visit Note   Patient: Jeffery Reed           Date of Birth: 1945/11/10           MRN: [id number] Visit Date: 12/17/2019              Requested by: Unk Pinto, [place] [place],  [place] [address] PCP: Unk Pinto, MD   Assessment & Plan: Visit Diagnoses:  1. Pain in left hip     Plan: Due to patient's continued left proximal femur pain despite IT band stretching exercises both on his own and physical therapy recommend MRI to rule out any pathology as the source of his left proximal femur pain.  He will follow-up after the MRI to go over results discuss further treatment.  Questions were encouraged and answered by Dr. Ninfa Linden and myself.  Follow-Up Instructions: Return After MRI.   Orders:  Orders Placed This Encounter  Procedures  . XR HIP UNILAT W OR W/O PELVIS 1V LEFT   No orders of the defined types were placed in this encounter.     Procedures: No procedures performed   Clinical Data: No additional findings.   Subjective: Chief Complaint  Patient presents with  . Left Hip - Follow-up    HPI Mr. Dimonte returns today due to left lateral hip pain and proximal lateral thigh pain.  He has had no known injury.  He has been going to the therapy states he just cannot get rid of all of the pain.  He denies any numbness tingling down the leg.  No back pain.  No groin pain.  He points to the lateral proximal femur region as the source of his pain.  He notes that the left leg feels as if it is going to give way on him at times especially whenever going up and down steps at work.  He is still working to get his diabetes under control. Review of Systems See HPI otherwise negative  Objective: Vital Signs: There were no vitals taken for this visit.  Physical Exam Constitutional:      Appearance: He is not ill-appearing or diaphoretic.  Pulmonary:     Effort: Pulmonary effort is normal.  Neurological:     Mental Status: He  is alert and oriented to person, place, and time.  Psychiatric:        Mood and Affect: Mood normal.     Ortho Exam Lateral hips excellent range of motion without pain.  Nontender over trochanteric region.  Nontender over the proximal lateral femur.  Left knee positive patellofemoral crepitus.  No tenderness with palpation along medial lateral joint line.  No instability valgus varus stressing left knee. Specialty Comments:  No specialty comments available.  Imaging: XR HIP UNILAT W OR W/O PELVIS 1V LEFT  Result Date: 12/17/2019 AP pelvis lateral view of the left femur: Films are low AP pelvis includes much of the femur as possible.  There is no pathology the femur.  There is artifact due to patient's overlying clothing proximal shaft seen mostly on the lateral view.  Hips well located.  Hips also well preserved.  No acute fracture    PMFS History: Patient Active Problem List   Diagnosis Date Noted  . CKD stage 3 due to type 2 diabetes mellitus ([place]) 03/12/2019  . Anxiety 02/18/2018  . GERD (gastroesophageal reflux disease) 03/12/2016  . Obesity (BMI 30.0-34.9) 06/01/2015  . Medication management 08/19/2014  . BPH/Prostatism 08/19/2014  . Essential  hypertension 08/17/2013  . Type 2 diabetes mellitus with stage 3 chronic kidney disease, without long-term current use of insulin ([place]) 08/17/2013  . Vitamin D deficiency 08/17/2013  . Hyperlipidemia associated with type 2 diabetes mellitus ([place])   . Testosterone deficiency   . Gout   . OSA (obstructive sleep apnea)    Past Medical History:  Diagnosis Date  . Allergy   . Anxiety   . Cataract    beginning stage 01/22/2019  . Chronic kidney disease    enlarge bladder  . Diabetes mellitus without complication ([place])   . GERD (gastroesophageal reflux disease)   . Gout   . Hyperlipidemia   . Hypertension   . OSA (obstructive sleep apnea)   . Pre-diabetes   . Sleep apnea    c pap    Family History  Problem Relation Age of Onset   . Hypertension Mother   . GER disease Mother   . Hyperlipidemia Father   . Hypertension Father   . Parkinson's disease Father   . Hypertension Brother   . Diabetes Maternal Grandmother   . Stroke Paternal Grandmother   . Diabetes Paternal Grandmother   . Stroke Paternal Grandfather   . Colon cancer Neg Hx   . Colon polyps Neg Hx   . Esophageal cancer Neg Hx   . Stomach cancer Neg Hx   . Rectal cancer Neg Hx     Past Surgical History:  Procedure Laterality Date  . COLONOSCOPY    . FOOT SURGERY  10/2009  . MYRINGOTOMY Left 02-2005   Dr.Newman  . TONSILLECTOMY  1953   Social History   Occupational History  . Not on file  Tobacco Use  . Smoking status: Never Smoker  . Smokeless tobacco: Never Used  Substance and Sexual Activity  . Alcohol use: No  . Drug use: No  . Sexual activity: Not on file

## 2019-12-18 DIAGNOSIS — M25552 Pain in left hip: Secondary | ICD-10-CM | POA: Diagnosis not present

## 2019-12-22 DIAGNOSIS — M25552 Pain in left hip: Secondary | ICD-10-CM | POA: Diagnosis not present

## 2019-12-24 DIAGNOSIS — M25552 Pain in left hip: Secondary | ICD-10-CM | POA: Diagnosis not present

## 2019-12-28 ENCOUNTER — Other Ambulatory Visit: Payer: Self-pay

## 2019-12-28 ENCOUNTER — Encounter (HOSPITAL_COMMUNITY): Payer: Self-pay

## 2019-12-28 ENCOUNTER — Inpatient Hospital Stay (HOSPITAL_COMMUNITY)
Admission: EM | Admit: 2019-12-28 | Discharge: 2020-01-02 | DRG: 025 | Disposition: A | Discharge status: Home / Self Care | Payer: Medicare Other | Attending: Internal Medicine | Admitting: Internal Medicine

## 2019-12-28 ENCOUNTER — Emergency Department (HOSPITAL_COMMUNITY): Payer: Medicare Other

## 2019-12-28 DIAGNOSIS — T380X5A Adverse effect of glucocorticoids and synthetic analogues, initial encounter: Secondary | ICD-10-CM

## 2019-12-28 DIAGNOSIS — R0902 Hypoxemia: Secondary | ICD-10-CM | POA: Diagnosis not present

## 2019-12-28 DIAGNOSIS — D320 Benign neoplasm of cerebral meninges: Principal | ICD-10-CM | POA: Diagnosis present

## 2019-12-28 DIAGNOSIS — Z20822 Contact with and (suspected) exposure to covid-19: Secondary | ICD-10-CM | POA: Diagnosis present

## 2019-12-28 DIAGNOSIS — Z833 Family history of diabetes mellitus: Secondary | ICD-10-CM | POA: Diagnosis not present

## 2019-12-28 DIAGNOSIS — M109 Gout, unspecified: Secondary | ICD-10-CM | POA: Diagnosis present

## 2019-12-28 DIAGNOSIS — Z8249 Family history of ischemic heart disease and other diseases of the circulatory system: Secondary | ICD-10-CM

## 2019-12-28 DIAGNOSIS — Z885 Allergy status to narcotic agent status: Secondary | ICD-10-CM

## 2019-12-28 DIAGNOSIS — Z79899 Other long term (current) drug therapy: Secondary | ICD-10-CM

## 2019-12-28 DIAGNOSIS — D496 Neoplasm of unspecified behavior of brain: Secondary | ICD-10-CM | POA: Diagnosis not present

## 2019-12-28 DIAGNOSIS — E1122 Type 2 diabetes mellitus with diabetic chronic kidney disease: Secondary | ICD-10-CM | POA: Diagnosis present

## 2019-12-28 DIAGNOSIS — Z9119 Patient's noncompliance with other medical treatment and regimen: Secondary | ICD-10-CM

## 2019-12-28 DIAGNOSIS — F688 Other specified disorders of adult personality and behavior: Secondary | ICD-10-CM

## 2019-12-28 DIAGNOSIS — R220 Localized swelling, mass and lump, head: Secondary | ICD-10-CM | POA: Diagnosis not present

## 2019-12-28 DIAGNOSIS — Z9114 Patient's other noncompliance with medication regimen: Secondary | ICD-10-CM | POA: Diagnosis not present

## 2019-12-28 DIAGNOSIS — G9389 Other specified disorders of brain: Secondary | ICD-10-CM

## 2019-12-28 DIAGNOSIS — H9190 Unspecified hearing loss, unspecified ear: Secondary | ICD-10-CM | POA: Diagnosis present

## 2019-12-28 DIAGNOSIS — R0682 Tachypnea, not elsewhere classified: Secondary | ICD-10-CM

## 2019-12-28 DIAGNOSIS — C719 Malignant neoplasm of brain, unspecified: Secondary | ICD-10-CM | POA: Diagnosis not present

## 2019-12-28 DIAGNOSIS — E860 Dehydration: Secondary | ICD-10-CM | POA: Diagnosis not present

## 2019-12-28 DIAGNOSIS — W19XXXA Unspecified fall, initial encounter: Secondary | ICD-10-CM | POA: Diagnosis present

## 2019-12-28 DIAGNOSIS — N179 Acute kidney failure, unspecified: Secondary | ICD-10-CM | POA: Diagnosis not present

## 2019-12-28 DIAGNOSIS — S51012A Laceration without foreign body of left elbow, initial encounter: Secondary | ICD-10-CM | POA: Diagnosis present

## 2019-12-28 DIAGNOSIS — Y92015 Private garage of single-family (private) house as the place of occurrence of the external cause: Secondary | ICD-10-CM

## 2019-12-28 DIAGNOSIS — N183 Chronic kidney disease, stage 3 unspecified: Secondary | ICD-10-CM

## 2019-12-28 DIAGNOSIS — Z823 Family history of stroke: Secondary | ICD-10-CM | POA: Diagnosis not present

## 2019-12-28 DIAGNOSIS — Z888 Allergy status to other drugs, medicaments and biological substances status: Secondary | ICD-10-CM

## 2019-12-28 DIAGNOSIS — Z83438 Family history of other disorder of lipoprotein metabolism and other lipidemia: Secondary | ICD-10-CM

## 2019-12-28 DIAGNOSIS — I129 Hypertensive chronic kidney disease with stage 1 through stage 4 chronic kidney disease, or unspecified chronic kidney disease: Secondary | ICD-10-CM | POA: Diagnosis present

## 2019-12-28 DIAGNOSIS — I10 Essential (primary) hypertension: Secondary | ICD-10-CM | POA: Diagnosis not present

## 2019-12-28 DIAGNOSIS — N1831 Chronic kidney disease, stage 3a: Secondary | ICD-10-CM | POA: Diagnosis present

## 2019-12-28 DIAGNOSIS — K219 Gastro-esophageal reflux disease without esophagitis: Secondary | ICD-10-CM | POA: Diagnosis not present

## 2019-12-28 DIAGNOSIS — Z820 Family history of epilepsy and other diseases of the nervous system: Secondary | ICD-10-CM | POA: Diagnosis not present

## 2019-12-28 DIAGNOSIS — R7303 Prediabetes: Secondary | ICD-10-CM | POA: Diagnosis not present

## 2019-12-28 DIAGNOSIS — E1165 Type 2 diabetes mellitus with hyperglycemia: Secondary | ICD-10-CM | POA: Diagnosis not present

## 2019-12-28 DIAGNOSIS — N400 Enlarged prostate without lower urinary tract symptoms: Secondary | ICD-10-CM | POA: Diagnosis not present

## 2019-12-28 DIAGNOSIS — R001 Bradycardia, unspecified: Secondary | ICD-10-CM | POA: Diagnosis not present

## 2019-12-28 DIAGNOSIS — R4182 Altered mental status, unspecified: Secondary | ICD-10-CM | POA: Diagnosis not present

## 2019-12-28 DIAGNOSIS — R739 Hyperglycemia, unspecified: Secondary | ICD-10-CM | POA: Diagnosis not present

## 2019-12-28 DIAGNOSIS — G4733 Obstructive sleep apnea (adult) (pediatric): Secondary | ICD-10-CM | POA: Diagnosis present

## 2019-12-28 DIAGNOSIS — E785 Hyperlipidemia, unspecified: Secondary | ICD-10-CM | POA: Diagnosis present

## 2019-12-28 DIAGNOSIS — Z7984 Long term (current) use of oral hypoglycemic drugs: Secondary | ICD-10-CM

## 2019-12-28 DIAGNOSIS — G936 Cerebral edema: Secondary | ICD-10-CM | POA: Diagnosis present

## 2019-12-28 DIAGNOSIS — R55 Syncope and collapse: Secondary | ICD-10-CM | POA: Diagnosis not present

## 2019-12-28 DIAGNOSIS — D329 Benign neoplasm of meninges, unspecified: Secondary | ICD-10-CM | POA: Diagnosis not present

## 2019-12-28 DIAGNOSIS — M5030 Other cervical disc degeneration, unspecified cervical region: Secondary | ICD-10-CM | POA: Diagnosis not present

## 2019-12-28 LAB — COMPREHENSIVE METABOLIC PANEL
ALT: 17 U/L (ref 0–44)
AST: 29 U/L (ref 15–41)
Albumin: 3.7 g/dL (ref 3.5–5.0)
Alkaline Phosphatase: 48 U/L (ref 38–126)
Anion gap: 12 (ref 5–15)
BUN: 21 mg/dL (ref 8–23)
CO2: 23 mmol/L (ref 22–32)
Calcium: 9 mg/dL (ref 8.9–10.3)
Chloride: 105 mmol/L (ref 98–111)
Creatinine, Ser: 1.7 mg/dL — ABNORMAL HIGH (ref 0.61–1.24)
GFR calc Af Amer: 46 mL/min — ABNORMAL LOW (ref >60)
GFR calc non Af Amer: 39 mL/min — ABNORMAL LOW (ref >60)
Glucose, Bld: 248 mg/dL — ABNORMAL HIGH (ref 70–99)
Potassium: 3.8 mmol/L (ref 3.5–5.1)
Sodium: 140 mmol/L (ref 135–145)
Total Bilirubin: 0.9 mg/dL (ref 0.3–1.2)
Total Protein: 6.3 g/dL — ABNORMAL LOW (ref 6.5–8.1)

## 2019-12-28 LAB — URINALYSIS, ROUTINE W REFLEX MICROSCOPIC
Bacteria, UA: NONE SEEN
Bilirubin Urine: NEGATIVE
Glucose, UA: >=500 mg/dL — AB
Hgb urine dipstick: NEGATIVE
Ketones, ur: NEGATIVE mg/dL
Leukocytes,Ua: NEGATIVE
Nitrite: NEGATIVE
Protein, ur: NEGATIVE mg/dL
Specific Gravity, Urine: 1.023 (ref 1.005–1.030)
pH: 5 (ref 5.0–8.0)

## 2019-12-28 LAB — CBC
HCT: 38.9 % — ABNORMAL LOW (ref 39.0–52.0)
Hemoglobin: 13.3 g/dL (ref 13.0–17.0)
MCH: 29.4 pg (ref 26.0–34.0)
MCHC: 34.2 g/dL (ref 30.0–36.0)
MCV: 85.9 fL (ref 80.0–100.0)
Platelets: 171 10*3/uL (ref 150–400)
RBC: 4.53 MIL/uL (ref 4.22–5.81)
RDW: 13.9 % (ref 11.5–15.5)
WBC: 10.9 10*3/uL — ABNORMAL HIGH (ref 4.0–10.5)
nRBC: 0 % (ref 0.0–0.2)

## 2019-12-28 LAB — RESPIRATORY PANEL BY RT PCR (FLU A&B, COVID)
Influenza A by PCR: NEGATIVE
Influenza B by PCR: NEGATIVE
SARS Coronavirus 2 by RT PCR: NEGATIVE

## 2019-12-28 LAB — CBG MONITORING, ED: Glucose-Capillary: 180 mg/dL — ABNORMAL HIGH (ref 70–99)

## 2019-12-28 IMAGING — MR MR HEAD WO/W CM
4 of 14 series · 11 of 48 positions shown · IV contrast (gadavist)
Comparison: Prior head CT from earlier the same day.

CLINICAL DATA: Initial evaluation for brain mass.

EXAM:
MRI HEAD WITHOUT AND WITH CONTRAST
TECHNIQUE: Multiplanar, multiecho pulse sequences of the brain and surrounding
structures were obtained without and with intravenous contrast.
CONTRAST:  10mL GADAVIST GADOBUTROL 1 MMOL/ML IV SOLN

[Series 3: DWI · axial · 3.0mm · 0.94mm/px · z∈[-87,+72]mm · 5 of 108 slices shown]
[im 1/108]
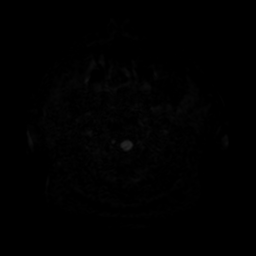
[im 22/108]
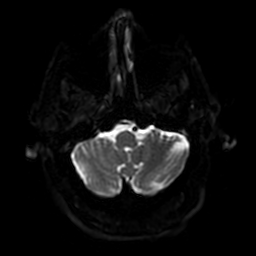
[im 43/108]
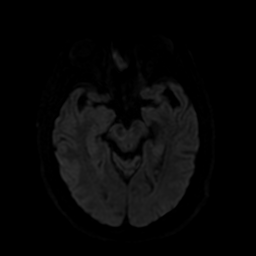
[im 65/108]
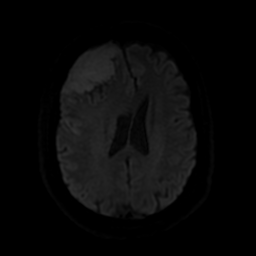
[im 108/108]
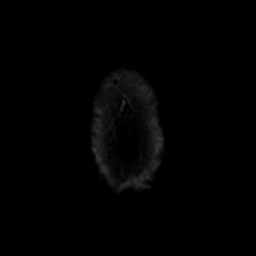

[Series 5: FLAIR · axial · 3.0mm · 0.45mm/px · z∈[-81,+69]mm · 2 of 26 slices shown (1 of 2)]
[im 1/26]
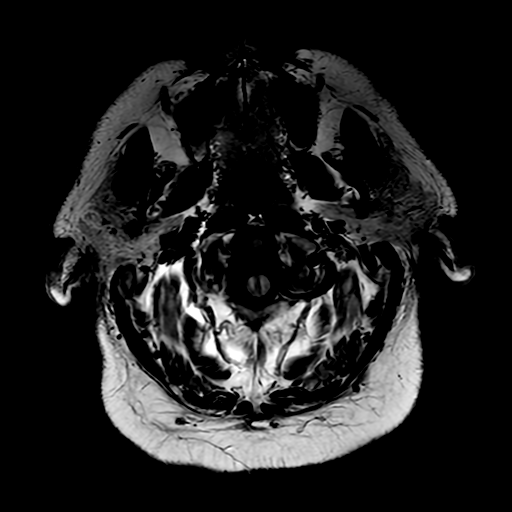
[im 26/26]
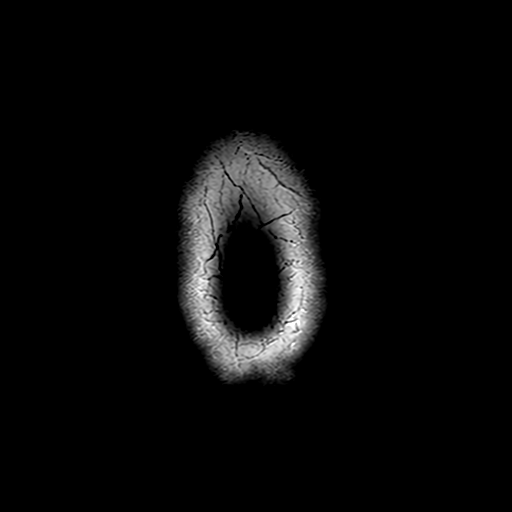

[Series 7: FLAIR · sagittal · 5.0mm · 0.23mm/px · 2 of 25 slices shown (2 of 2)]
[im 1/25]
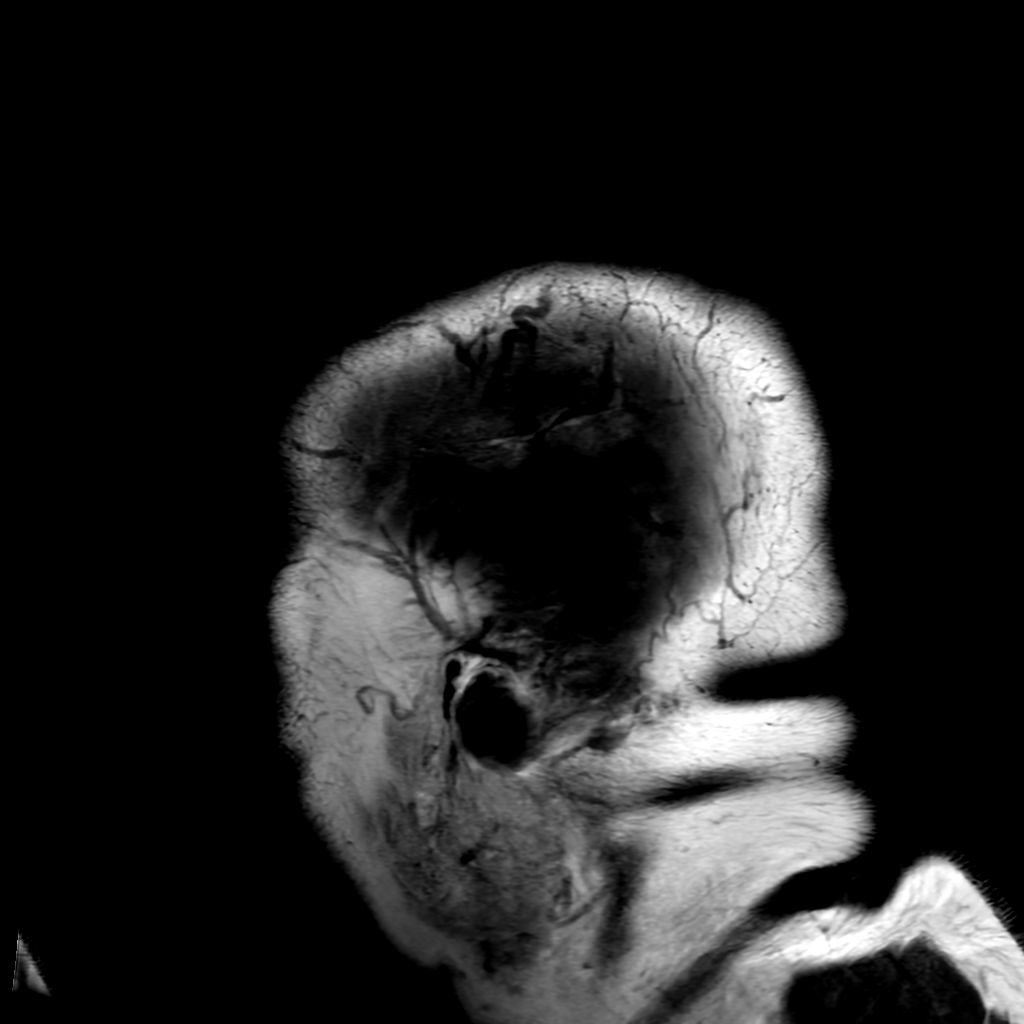
[im 25/25]
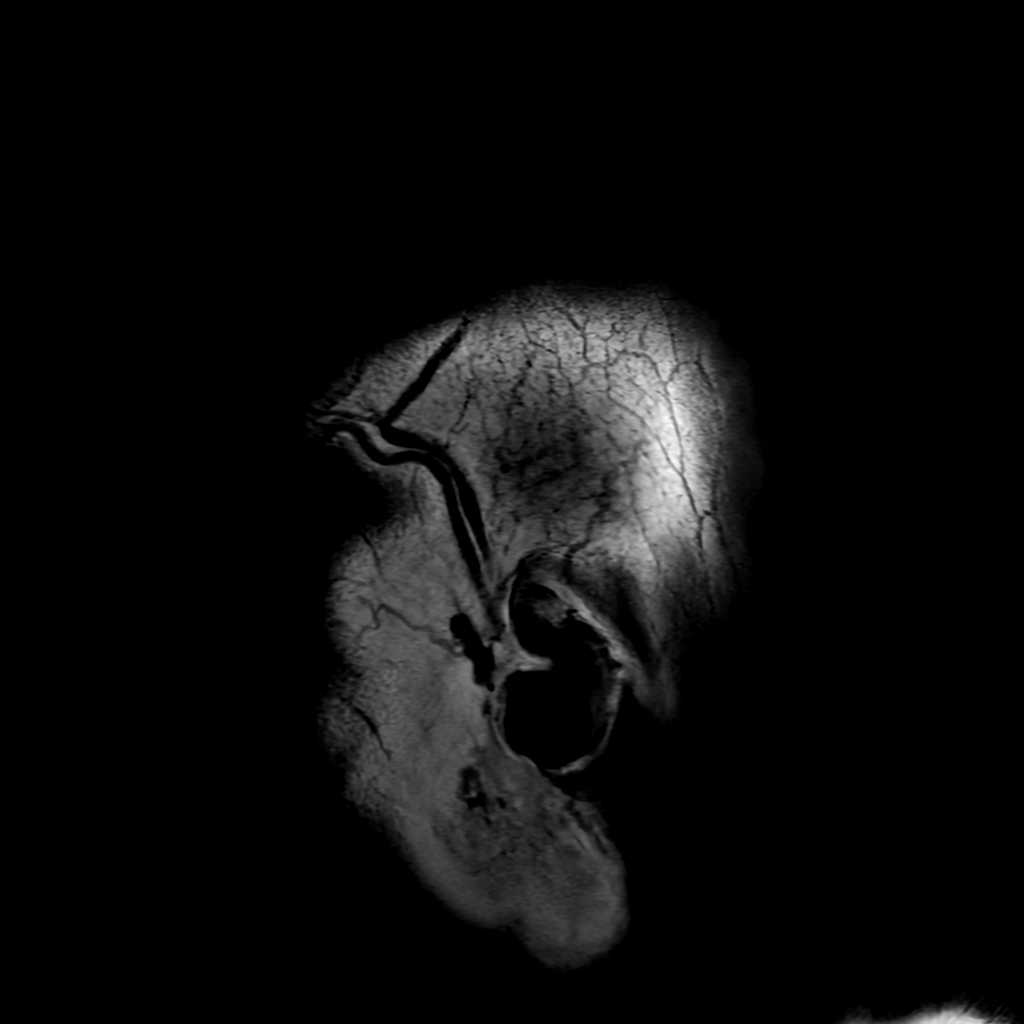

[Series 13: FLAIR post-contrast · sagittal · 5.0mm · 0.23mm/px · 2 of 25 slices shown]
[im 1/25]
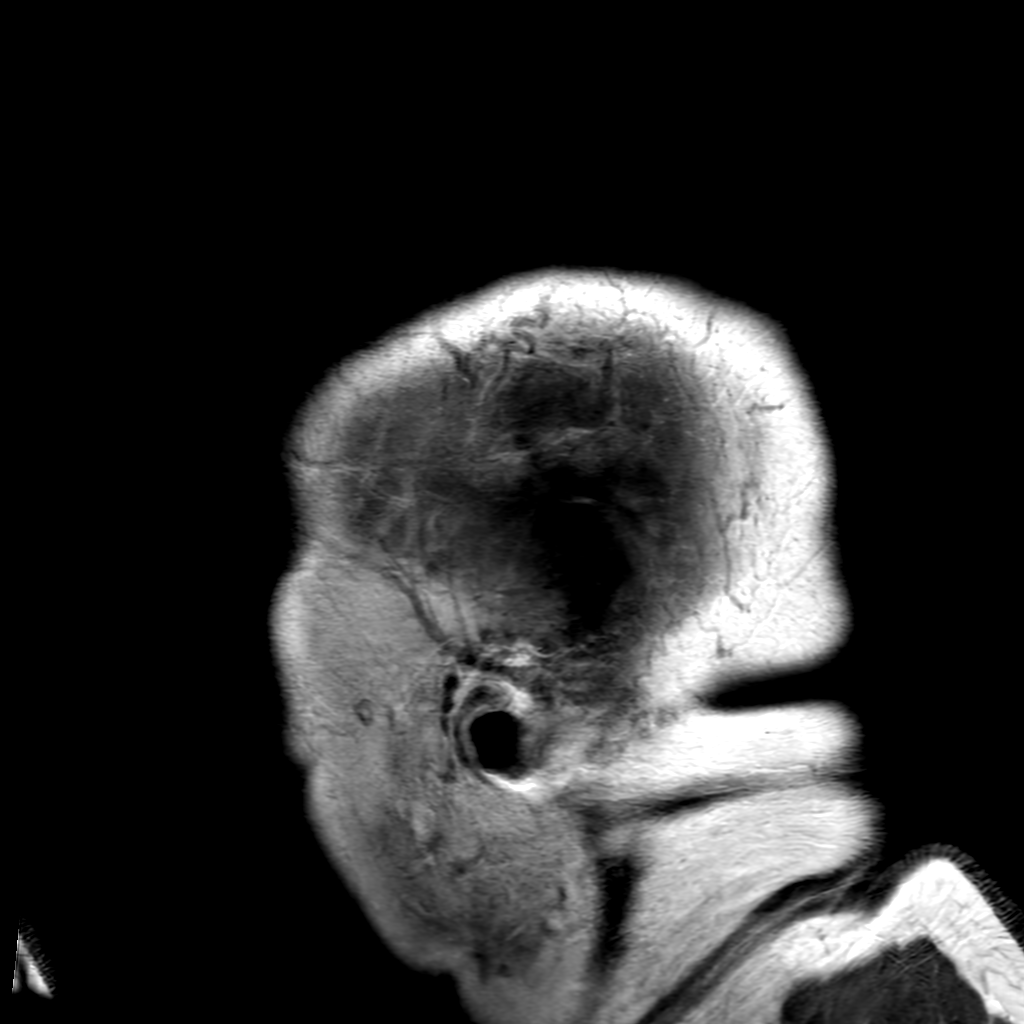
[im 25/25]
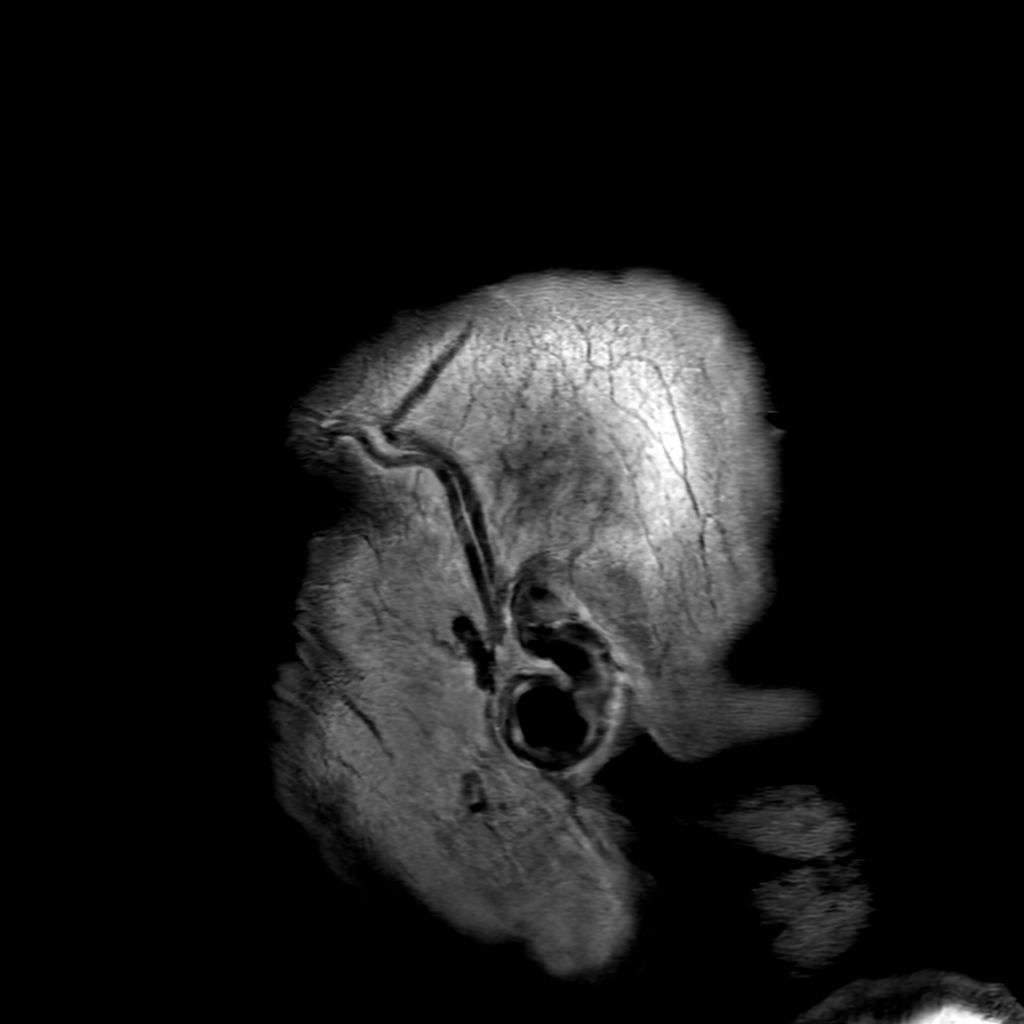

[11 of 48 positions shown; findings below may reference images not displayed]

FINDINGS: Brain: Generalized age-related cerebral atrophy. Mild scattered
T2/FLAIR hyperintensities involving the periventricular deep white
matter both cerebral hemispheres most consistent with chronic small
vessel ischemic disease, mild in nature. No evidence for acute or
subacute infarct. No encephalomalacia to suggest chronic cortical
infarction. No foci of susceptibility artifact to suggest acute or
chronic intracranial hemorrhage.

Large extra axial mass overlying the anterior right frontal
convexity measures approximately 7.0 x 2.9 x 3.1 cm (AP by
transverse by craniocaudad), most consistent with a meningioma.
Lesion demonstrates fairly homogeneous postcontrast enhancement.
Dural thickening and enhancement seen overlying the adjacent right
frontal convexity. Anteromedial aspect of the lesion abuts the
anterior aspect of the falx and possibly superior sagittal sinus.
Associated mass effect with vasogenic edema within the underlying
right cerebral hemisphere. Right lateral ventricle partially
effaced. Associated right-to-left shift measures up to 8 mm. No
hydrocephalus or ventricular trapping at this time. Basilar cisterns
remain patent.

No other mass lesion or abnormal enhancement. No extra-axial fluid
collection. Pituitary gland suprasellar region normal.

Vascular: Major intracranial vascular flow voids are maintained.

Skull and upper cervical spine: Craniocervical junction within
normal limits. Bone marrow signal intensity normal. No scalp soft
tissue abnormality.

Sinuses/Orbits: Globes and orbital soft tissues within normal
limits. Mild mucosal thickening noted within the ethmoidal air
cells. Paranasal sinuses are otherwise clear. Small bilateral
mastoid effusions noted, of doubtful significance. Visualized
nasopharynx within normal limits.

Other: None.
IMPRESSION: 1. 7.0 x 2.9 x 3.1 cm extra axial mass overlying the right frontal
convexity, most consistent with a meningioma. Associated mass effect
with vasogenic edema within the underlying right cerebral hemisphere
with up to 8 mm of right-to-left shift.
2. Underlying mild for age chronic microvascular ischemic disease.

## 2019-12-28 IMAGING — CT CT HEAD W/O CM
4 series · 16 of 47 positions shown, 18 images · non-contrast
Comparison: [DATE]

CLINICAL DATA: Found on the floor.

EXAM:
CT HEAD WITHOUT CONTRAST
TECHNIQUE: Contiguous axial images were obtained from the base of the skull
through the vertex without intravenous contrast.

[Series 3: head wo · axial · 0.48mm/px · z∈[-125,-5]mm · 7 of 33 slices shown, 9 images]
[im 5/33  brain]
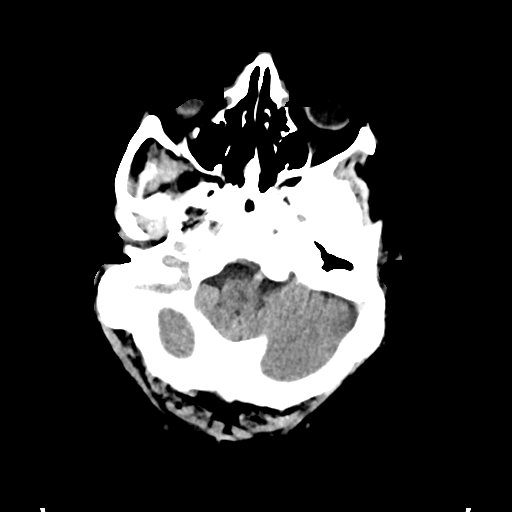
[im 5/33  bone]
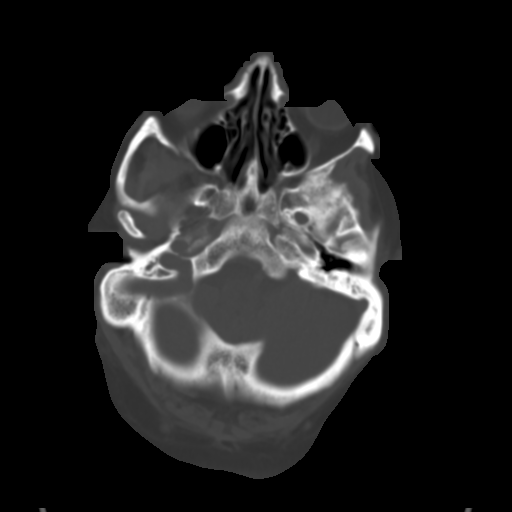
[im 9/33  brain]
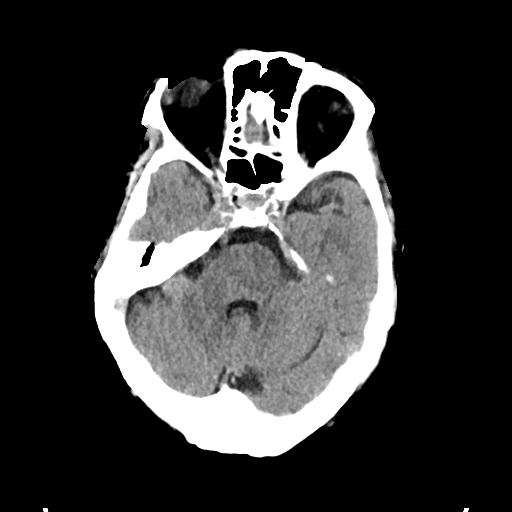
[im 13/33  brain]
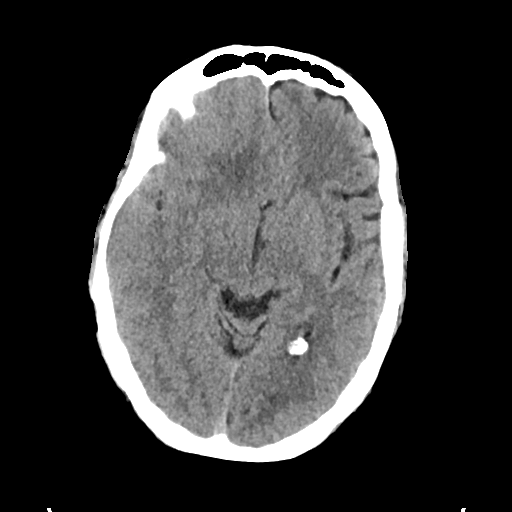
[im 17/33  brain]
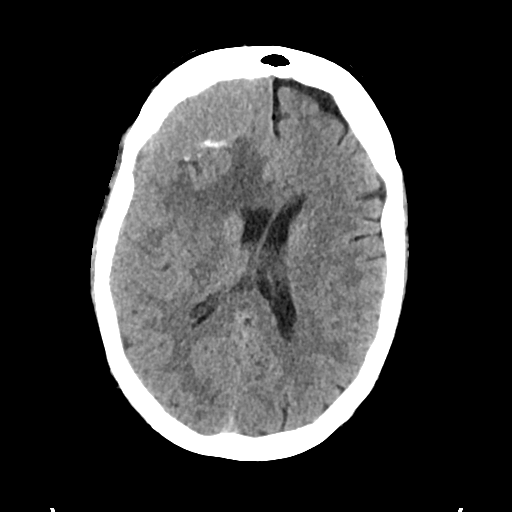
[im 21/33  brain]
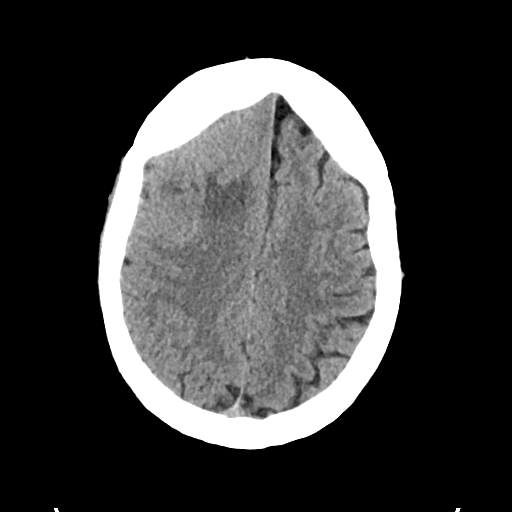
[im 21/33  bone]
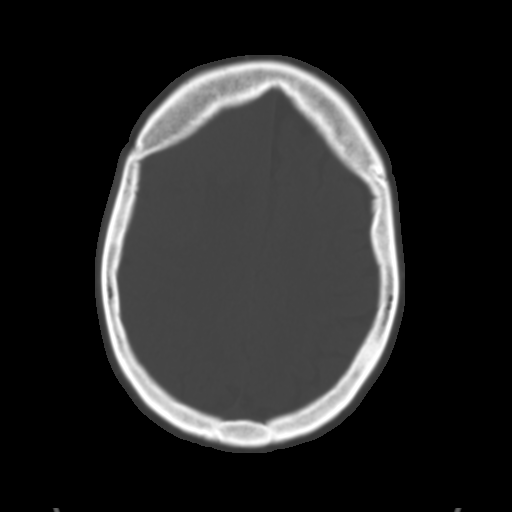
[im 25/33  brain]
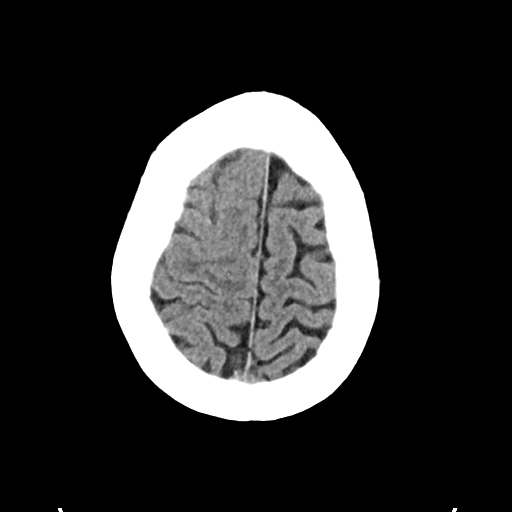
[im 29/33  brain]
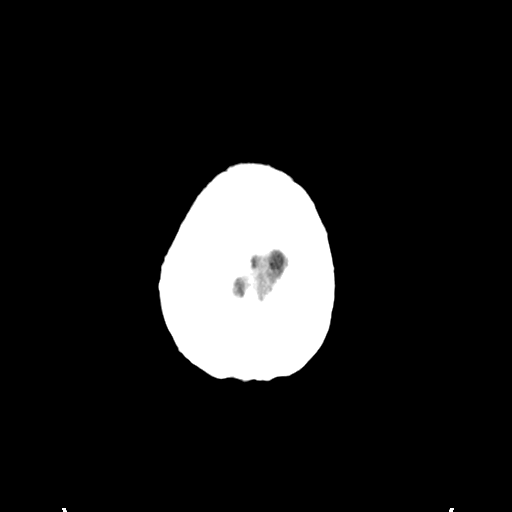

[Series 4: head bone · axial · 0.48mm/px · z∈[-129,-97]mm · 3 of 81 slices shown]
[im 9/81  bone]
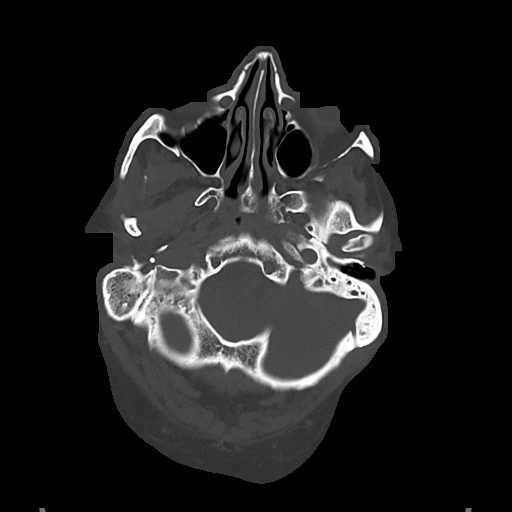
[im 17/81  bone]
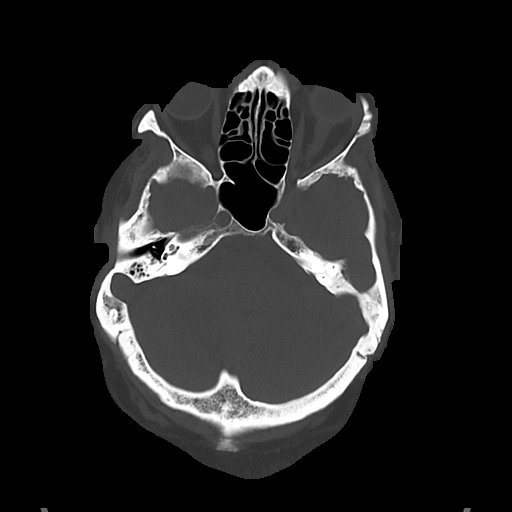
[im 25/81  bone]
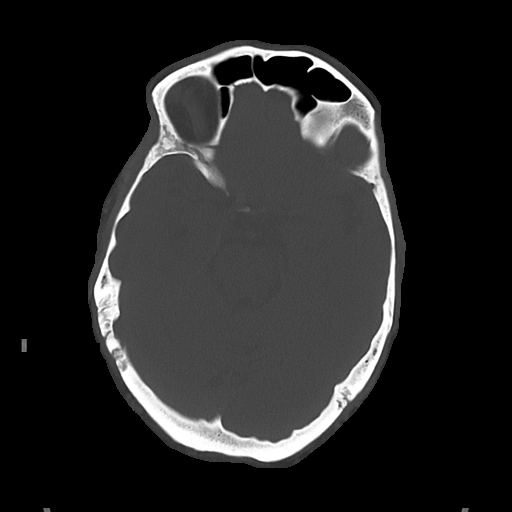

[Series 5: cor soft · coronal · 0.36mm/px · 3 of 72 slices shown]
[im 24/72  brain]
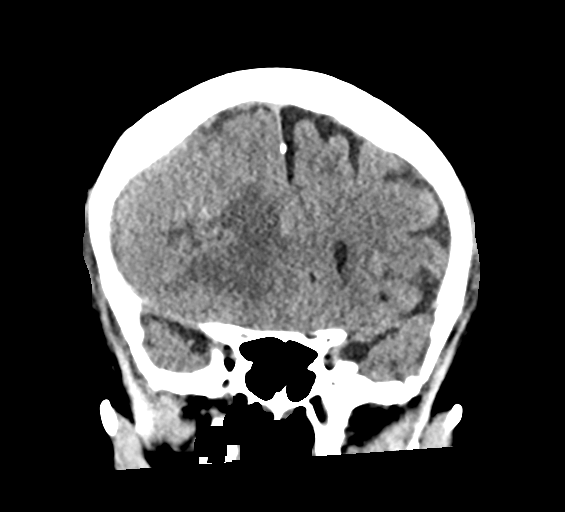
[im 32/72  brain]
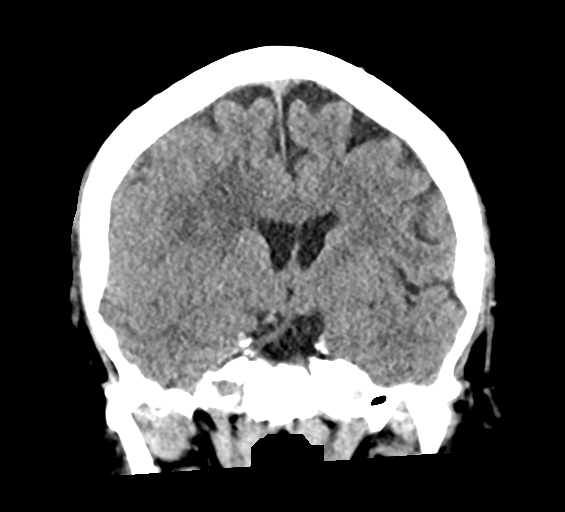
[im 40/72  brain]
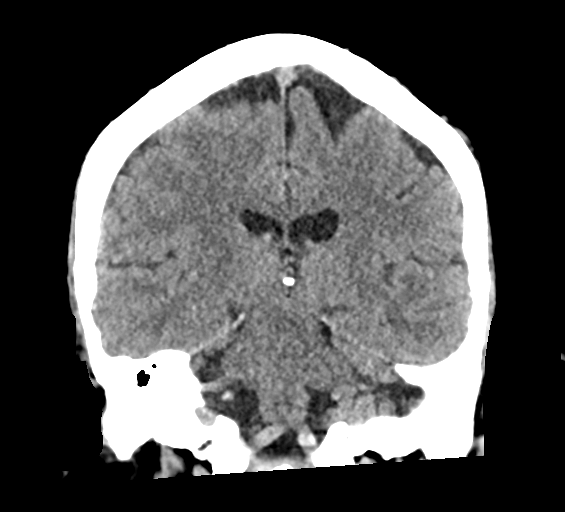

[Series 6: sag soft · sagittal · 0.38mm/px · 3 of 58 slices shown]
[im 20/58  brain]
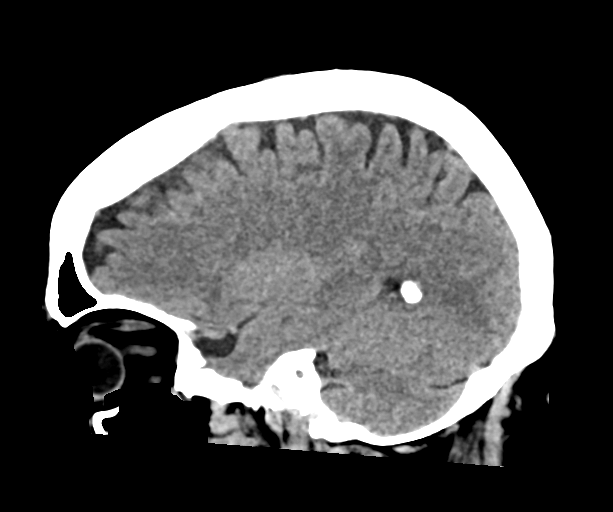
[im 29/58  brain]
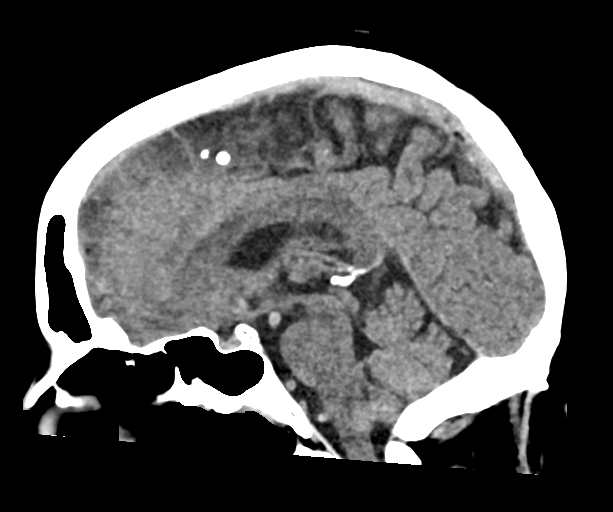
[im 39/58  brain]
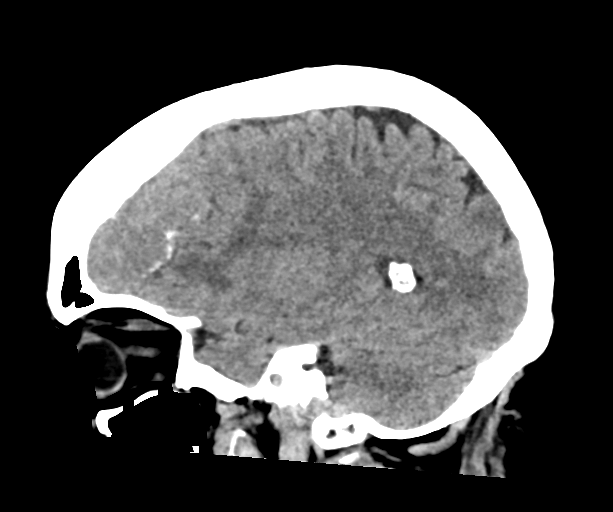

[16 of 47 positions shown; findings below may reference images not displayed]

FINDINGS: Brain: No evidence of acute infarction, hemorrhage or hydrocephalus.

A 6.0 cm x 2.7 cm x 4.5 cm partially calcified heterogeneous, mildly
hyperdense mass is seen within the right frontal lobe. This
represents a new finding when compared to the prior study. There is
associated mass effect on the adjacent sulci with 1.1 cm right to
left midline shift. A moderate amount of adjacent white matter low
attenuation is seen within the right frontal lobe

Vascular: No hyperdense vessels are identified.

Skull: Normal. Negative for fracture or focal lesion.

Sinuses/Orbits: No acute finding.

Other: None.
IMPRESSION: Large, 6.0 cm x 2.7 cm x 4.5 cm partially calcified heterogeneous,
mildly hyperdense mass within the right frontal lobe with associated
mass effect on the adjacent sulci and 1.1 cm right to left midline
shift. This is new when compared to the prior study. While this may
represent a large meningioma, MRI correlation is recommended.

## 2019-12-28 IMAGING — CT CT CERVICAL SPINE W/O CM
4 series · 15 of 33 positions shown, 18 images · non-contrast
Comparison: None.

CLINICAL DATA: Found on floor.

EXAM:
CT CERVICAL SPINE WITHOUT CONTRAST
TECHNIQUE: Multidetector CT imaging of the cervical spine was performed without
intravenous contrast. Multiplanar CT image reconstructions were also
generated.

[Series 5: c spine soft · axial · 0.35mm/px · z∈[-260,-226]mm · 2 of 102 slices shown]
[im 17/102  soft-tissue]
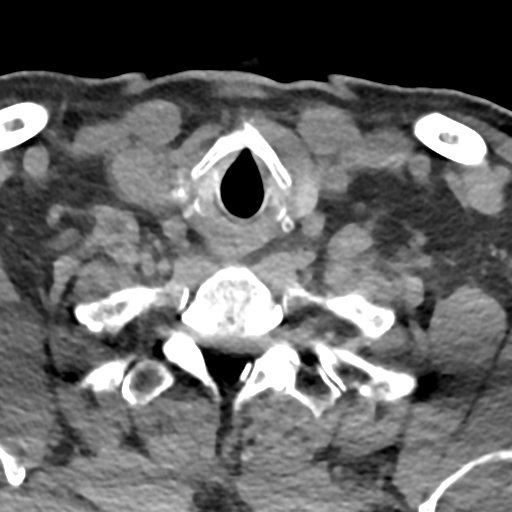
[im 34/102  soft-tissue]
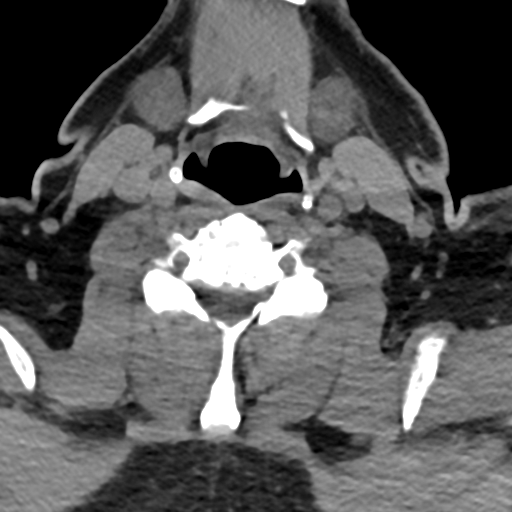

[Series 8: sag bone · sagittal · 0.40mm/px · 5 of 72 slices shown, 6 images]
[im 24/72  bone]
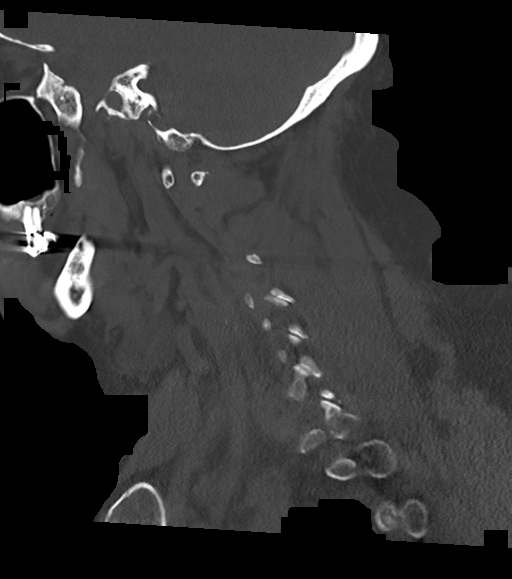
[im 30/72  bone]
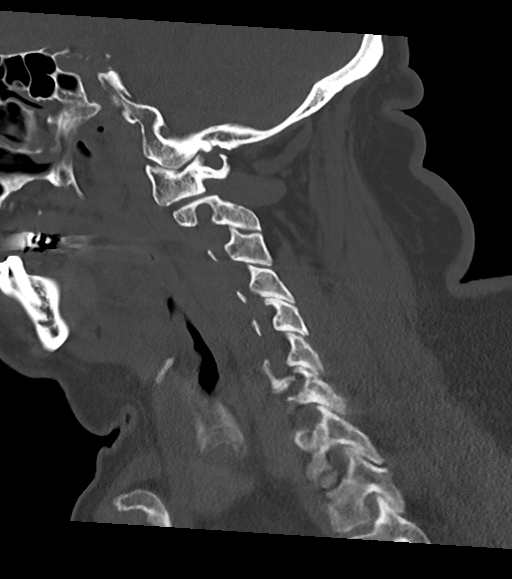
[im 36/72  soft-tissue]
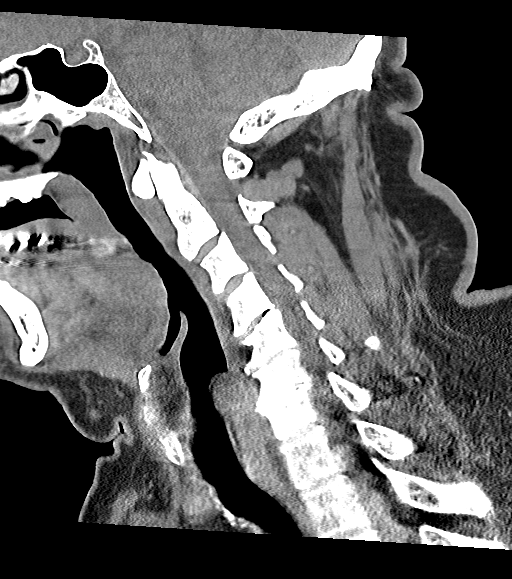
[im 36/72  bone]
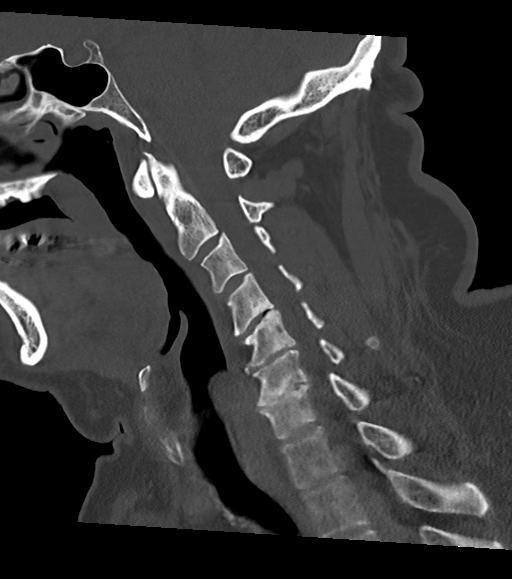
[im 42/72  bone]
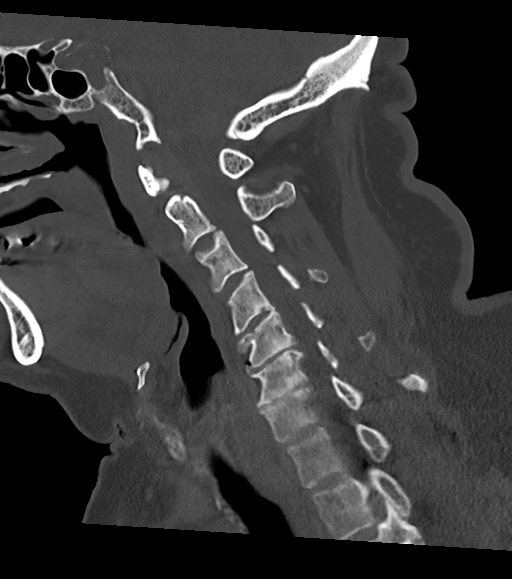
[im 48/72  bone]
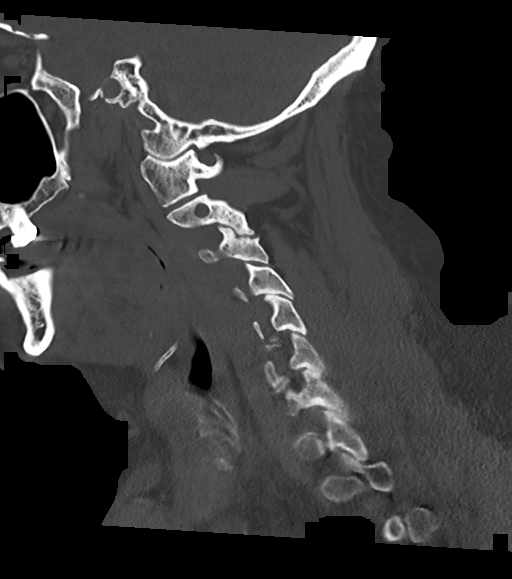

[Series 9: cor bone · coronal · 0.39mm/px · 3 of 66 slices shown]
[im 14/66  bone]
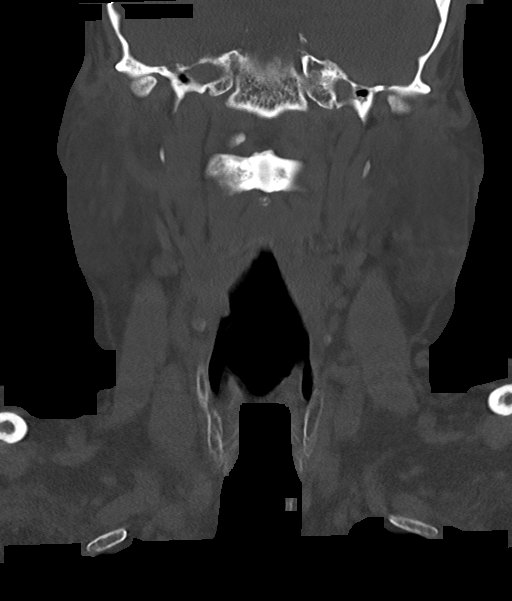
[im 27/66  bone]
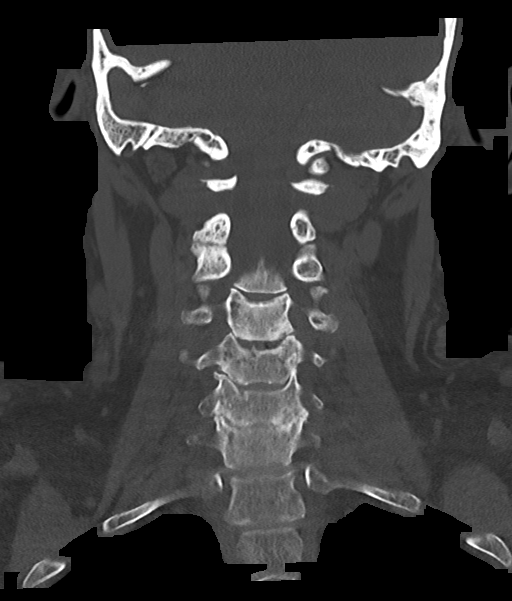
[im 40/66  bone]
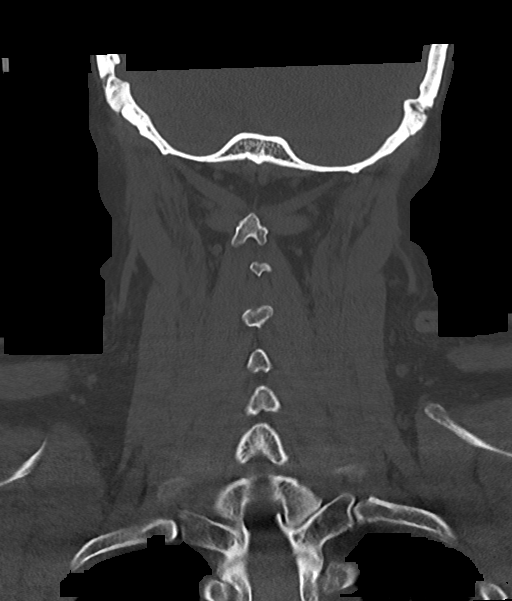

[Series 10: orthogonal axials · axial · 0.21mm/px · z∈[-281,-177]mm · 5 of 94 slices shown, 7 images]
[im 16/94  soft-tissue]
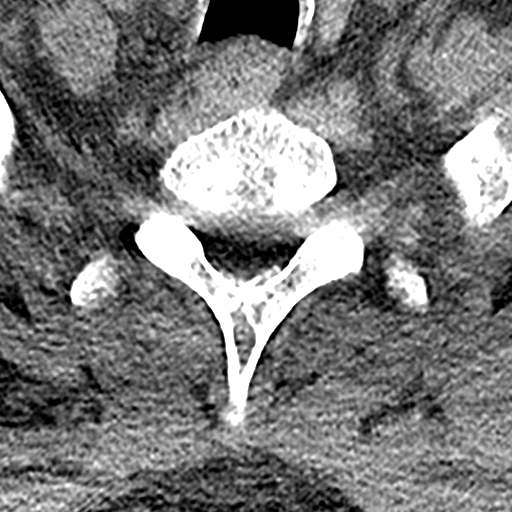
[im 16/94  bone]
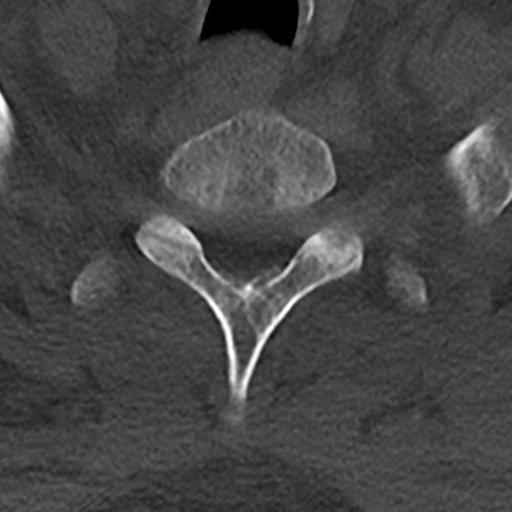
[im 32/94  bone]
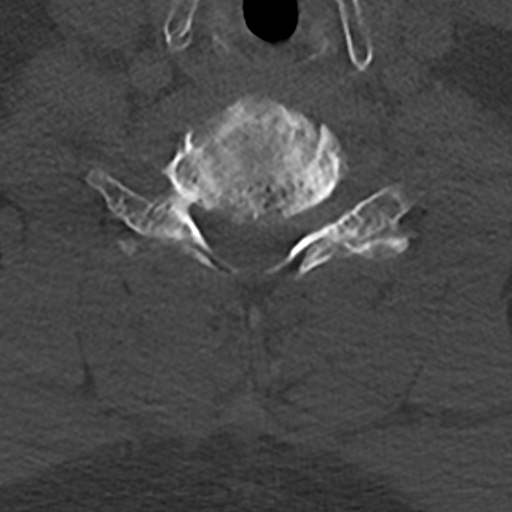
[im 47/94  bone]
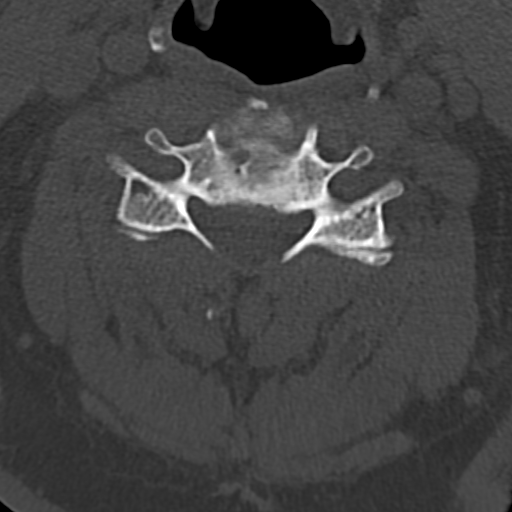
[im 63/94  bone]
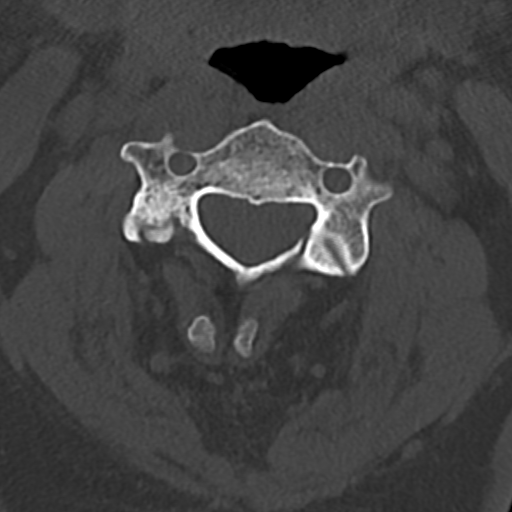
[im 78/94  soft-tissue]
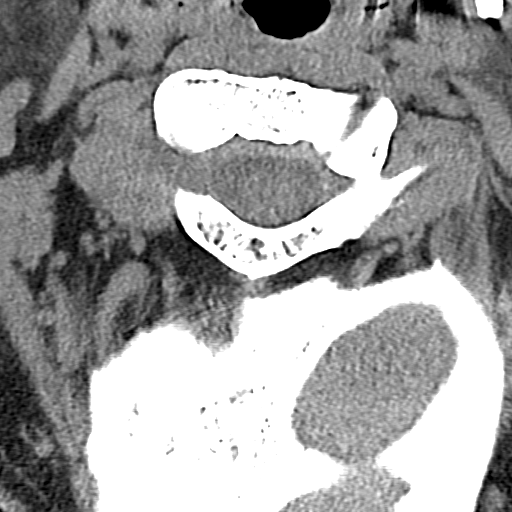
[im 78/94  bone]
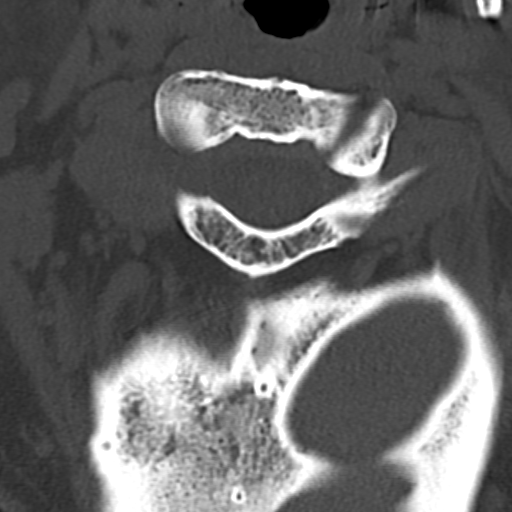

[15 of 33 positions shown; findings below may reference images not displayed]

FINDINGS: Alignment: Normal.

Skull base and vertebrae: No acute fracture. No primary bone lesion
or focal pathologic process.

Soft tissues and spinal canal: No prevertebral fluid or swelling. No
visible canal hematoma.

Disc levels: Moderate severity endplate sclerosis is seen at the
levels of C4-C5, C5-C6 and C6-C7. Mild to moderate severity
intervertebral disc space narrowing is also seen at these levels.

There is mild bilateral multilevel facet joint hypertrophy.

Upper chest: Negative.

Other: None.
IMPRESSION: 1. No acute osseous abnormality of the cervical spine.
2. Moderate severity multilevel degenerative changes, most prominent
at the levels of C4-C5, C5-C6 and C6-C7.

## 2019-12-28 MED ORDER — DEXAMETHASONE SODIUM PHOSPHATE 4 MG/ML IJ SOLN
4.0000 mg | Freq: Four times a day (QID) | INTRAMUSCULAR | Status: DC
  Administered 2019-12-29 – 2019-12-30 (×7): 4 mg via INTRAVENOUS
  Filled 2019-12-28 (×7): qty 1

## 2019-12-28 MED ORDER — INSULIN ASPART 100 UNIT/ML ~~LOC~~ SOLN
0.0000 [IU] | SUBCUTANEOUS | Status: DC
  Administered 2019-12-28: 23:00:00 3 [IU] via SUBCUTANEOUS
  Administered 2019-12-29 (×2): 5 [IU] via SUBCUTANEOUS
  Administered 2019-12-29 (×2): 11 [IU] via SUBCUTANEOUS
  Administered 2019-12-30: 8 [IU] via SUBCUTANEOUS
  Administered 2019-12-30: 20:00:00 3 [IU] via SUBCUTANEOUS
  Administered 2019-12-30: 03:00:00 8 [IU] via SUBCUTANEOUS
  Administered 2019-12-30: 23:00:00 11 [IU] via SUBCUTANEOUS
  Administered 2019-12-30 – 2019-12-31 (×3): 5 [IU] via SUBCUTANEOUS
  Administered 2019-12-31: 8 [IU] via SUBCUTANEOUS
  Administered 2019-12-31: 3 [IU] via SUBCUTANEOUS

## 2019-12-28 MED ORDER — GADOBUTROL 1 MMOL/ML IV SOLN
10.0000 mL | Freq: Once | INTRAVENOUS | Status: AC | PRN
  Administered 2019-12-28: 20:00:00 10 mL via INTRAVENOUS

## 2019-12-28 MED ORDER — LEVETIRACETAM IN NACL 500 MG/100ML IV SOLN
500.0000 mg | Freq: Two times a day (BID) | INTRAVENOUS | Status: DC
  Administered 2019-12-28 – 2019-12-31 (×5): 500 mg via INTRAVENOUS
  Filled 2019-12-28 (×6): qty 100

## 2019-12-28 MED ORDER — LIDOCAINE HCL (PF) 1 % IJ SOLN
2.0000 mL | Freq: Once | INTRAMUSCULAR | Status: DC
  Filled 2019-12-28: qty 30

## 2019-12-28 MED ORDER — TAMSULOSIN HCL 0.4 MG PO CAPS
0.4000 mg | ORAL_CAPSULE | Freq: Every day | ORAL | Status: DC
  Administered 2019-12-29 – 2020-01-02 (×3): 0.4 mg via ORAL
  Filled 2019-12-28 (×5): qty 1

## 2019-12-28 MED ORDER — DEXAMETHASONE SODIUM PHOSPHATE 10 MG/ML IJ SOLN
10.0000 mg | Freq: Once | INTRAMUSCULAR | Status: AC
  Administered 2019-12-28: 10 mg via INTRAVENOUS
  Filled 2019-12-28: qty 1

## 2019-12-28 NOTE — ED Notes (Signed)
Lido at bedside

## 2019-12-28 NOTE — Consult Note (Addendum)
Reason for Consult: Right frontal brain tumor Referring Physician: Dr. Silvio Clayman is an 74 y.o. male.  HPI: Jeffery Reed is a 74 year old right-handed individual who has had a fall today and has been having some mental confusion has been getting progressively worse he notes that he has been having headaches for several weeks now and his wife notes that his behavior has become a bit unusual.  After his fall because of confusion he was brought to the emergency room where a CT scan demonstrated presence of a right frontal lesion.  Is been complaining of some left hip weakness and left hip pain was to have an MRI of his left hip.  An MRI of the brain was performed this demonstrates that this lesion that measures about 7 x 2 x 4 cm and is densely adherent to the convexity of the skull in the right frontal region likely represents a meningioma.  There is significant mass-effect and vasogenic edema causing approximately 11 mm of shift.  He is being admitted for medical management as he has evidence of some renal failure hypertension and diabetes.  He will need surgical extirpation of this lesion.  Past Medical History:  Diagnosis Date  . Allergy   . Anxiety   . Cataract    beginning stage 01/22/2019  . Chronic kidney disease    enlarge bladder  . Diabetes mellitus without complication ([place])   . GERD (gastroesophageal reflux disease)   . Gout   . Hyperlipidemia   . Hypertension   . OSA (obstructive sleep apnea)   . Pre-diabetes   . Sleep apnea    c pap    Past Surgical History:  Procedure Laterality Date  . COLONOSCOPY    . FOOT SURGERY  10/2009  . MYRINGOTOMY Left 02-2005   Dr.Newman  . TONSILLECTOMY  1953    Family History  Problem Relation Age of Onset  . Hypertension Mother   . GER disease Mother   . Hyperlipidemia Father   . Hypertension Father   . Parkinson's disease Father   . Hypertension Brother   . Diabetes Maternal Grandmother   . Stroke Paternal Grandmother    . Diabetes Paternal Grandmother   . Stroke Paternal Grandfather   . Colon cancer Neg Hx   . Colon polyps Neg Hx   . Esophageal cancer Neg Hx   . Stomach cancer Neg Hx   . Rectal cancer Neg Hx     Social History:  reports that he has never smoked. He has never used smokeless tobacco. He reports that he does not drink alcohol or use drugs.  Allergies:  Allergies  Allergen Reactions  . Cardura [Doxazosin Mesylate] Other (See Comments)    Nasal congestion  . Codeine Other (See Comments)    The patient passed out  . Hytrin [Terazosin] Other (See Comments)    Reaction not recalled    Medications: I have reviewed the patient's current medications.  Results for orders placed or performed during the hospital encounter of 12/28/19 (from the past 48 hour(s))  CBC     Status: Abnormal   Collection Time: 12/28/19  6:12 PM  Result Value Ref Range   WBC 10.9 (H) 4.0 - 10.5 K/uL   RBC 4.53 4.22 - 5.81 MIL/uL   Hemoglobin 13.3 13.0 - 17.0 g/dL   HCT 38.9 (L) 39.0 - 52.0 %   MCV 85.9 80.0 - 100.0 fL   MCH 29.4 26.0 - 34.0 pg   MCHC 34.2 30.0 -  36.0 g/dL   RDW 13.9 11.5 - 15.5 %   Platelets 171 150 - 400 K/uL   nRBC 0.0 0.0 - 0.2 %    Comment: Performed at [facility], [place] [address] [place], [place] [address]  Comprehensive metabolic panel     Status: Abnormal   Collection Time: 12/28/19  6:12 PM  Result Value Ref Range   Sodium 140 135 - 145 mmol/L   Potassium 3.8 3.5 - 5.1 mmol/L   Chloride 105 98 - 111 mmol/L   CO2 23 22 - 32 mmol/L   Glucose, Bld 248 (H) 70 - 99 mg/dL    Comment: Glucose reference range applies only to samples taken after fasting for at least 8 hours.   BUN 21 8 - 23 mg/dL   Creatinine, Ser 1.70 (H) 0.61 - 1.24 mg/dL   Calcium 9.0 8.9 - 10.3 mg/dL   Total Protein 6.3 (L) 6.5 - 8.1 g/dL   Albumin 3.7 3.5 - 5.0 g/dL   AST 29 15 - 41 U/L   ALT 17 0 - 44 U/L   Alkaline Phosphatase 48 38 - 126 U/L   Total Bilirubin 0.9 0.3 - 1.2 mg/dL   GFR calc non Af  Amer 39 (L) >60 mL/min   GFR calc Af Amer 46 (L) >60 mL/min   Anion gap 12 5 - 15    Comment: Performed at [facility] [address], [place], [place] [address]    CT HEAD WO CONTRAST  Result Date: 12/28/2019 CLINICAL DATA:  Found on the floor. EXAM: CT HEAD WITHOUT CONTRAST TECHNIQUE: Contiguous axial images were obtained from the base of the skull through the vertex without intravenous contrast. COMPARISON:  September 23, 2008 FINDINGS: Brain: No evidence of acute infarction, hemorrhage or hydrocephalus. A 6.0 cm x 2.7 cm x 4.5 cm partially calcified heterogeneous, mildly hyperdense mass is seen within the right frontal lobe. This represents a new finding when compared to the prior study. There is associated mass effect on the adjacent sulci with 1.1 cm right to left midline shift. A moderate amount of adjacent white matter low attenuation is seen within the right frontal lobe Vascular: No hyperdense vessels are identified. Skull: Normal. Negative for fracture or focal lesion. Sinuses/Orbits: No acute finding. Other: None. IMPRESSION: Large, 6.0 cm x 2.7 cm x 4.5 cm partially calcified heterogeneous, mildly hyperdense mass within the right frontal lobe with associated mass effect on the adjacent sulci and 1.1 cm right to left midline shift. This is new when compared to the prior study. While this may represent a large meningioma, MRI correlation is recommended. Electronically Signed   By: Virgina Norfolk M.D.   On: 12/28/2019 18:09   CT CERVICAL SPINE WO CONTRAST  Result Date: 12/28/2019 CLINICAL DATA:  Found on floor. EXAM: CT CERVICAL SPINE WITHOUT CONTRAST TECHNIQUE: Multidetector CT imaging of the cervical spine was performed without intravenous contrast. Multiplanar CT image reconstructions were also generated. COMPARISON:  None. FINDINGS: Alignment: Normal. Skull base and vertebrae: No acute fracture. No primary bone lesion or focal pathologic process. Soft tissues and spinal canal: No  prevertebral fluid or swelling. No visible canal hematoma. Disc levels: Moderate severity endplate sclerosis is seen at the levels of C4-C5, C5-C6 and C6-C7. Mild to moderate severity intervertebral disc space narrowing is also seen at these levels. There is mild bilateral multilevel facet joint hypertrophy. Upper chest: Negative. Other: None. IMPRESSION: 1. No acute osseous abnormality of the cervical spine. 2. Moderate severity  multilevel degenerative changes, most prominent at the levels of C4-C5, C5-C6 and C6-C7. Electronically Signed   By: Virgina Norfolk M.D.   On: 12/28/2019 18:13   MR Brain W and Wo Contrast  Result Date: 12/28/2019 CLINICAL DATA:  Initial evaluation for brain mass. EXAM: MRI HEAD WITHOUT AND WITH CONTRAST TECHNIQUE: Multiplanar, multiecho pulse sequences of the brain and surrounding structures were obtained without and with intravenous contrast. CONTRAST:  32mL GADAVIST GADOBUTROL 1 MMOL/ML IV SOLN COMPARISON:  Prior head CT from earlier the same day. FINDINGS: Brain: Generalized age-related cerebral atrophy. Mild scattered T2/FLAIR hyperintensities involving the periventricular deep white matter both cerebral hemispheres most consistent with chronic small vessel ischemic disease, mild in nature. No evidence for acute or subacute infarct. No encephalomalacia to suggest chronic cortical infarction. No foci of susceptibility artifact to suggest acute or chronic intracranial hemorrhage. Large extra axial mass overlying the anterior right frontal convexity measures approximately 7.0 x 2.9 x 3.1 cm (AP by transverse by craniocaudad), most consistent with a meningioma. Lesion demonstrates fairly homogeneous postcontrast enhancement. Dural thickening and enhancement seen overlying the adjacent right frontal convexity. Anteromedial aspect of the lesion abuts the anterior aspect of the falx and possibly superior sagittal sinus. Associated mass effect with vasogenic edema within the underlying  right cerebral hemisphere. Right lateral ventricle partially effaced. Associated right-to-left shift measures up to 8 mm. No hydrocephalus or ventricular trapping at this time. Basilar cisterns remain patent. No other mass lesion or abnormal enhancement. No extra-axial fluid collection. Pituitary gland suprasellar region normal. Vascular: Major intracranial vascular flow voids are maintained. Skull and upper cervical spine: Craniocervical junction within normal limits. Bone marrow signal intensity normal. No scalp soft tissue abnormality. Sinuses/Orbits: Globes and orbital soft tissues within normal limits. Mild mucosal thickening noted within the ethmoidal air cells. Paranasal sinuses are otherwise clear. Small bilateral mastoid effusions noted, of doubtful significance. Visualized nasopharynx within normal limits. Other: None. IMPRESSION: 1. 7.0 x 2.9 x 3.1 cm extra axial mass overlying the right frontal convexity, most consistent with a meningioma. Associated mass effect with vasogenic edema within the underlying right cerebral hemisphere with up to 8 mm of right-to-left shift. 2. Underlying mild for age chronic microvascular ischemic disease. Electronically Signed   By: Jeannine Boga M.D.   On: 12/28/2019 20:39    Review of Systems  Constitutional: Positive for activity change.  HENT: Negative.   Eyes: Negative.   Respiratory: Negative.   Cardiovascular: Negative.   Gastrointestinal: Negative.   Endocrine: Negative.   Genitourinary: Negative.   Musculoskeletal: Positive for gait problem.  Allergic/Immunologic: Negative.   Neurological:       Behavioral change with apathy and lethargy.  Hematological: Negative.   Psychiatric/Behavioral: Positive for behavioral problems and confusion.   Blood pressure 113/73, pulse 95, temperature 97.9 F (36.6 C), temperature source Oral, resp. rate 17, height 5\' 10"  (1.778 m), weight 102.1 kg, SpO2 92 %. Physical Exam  Constitutional: He is oriented  to person, place, and time. He appears well-developed and well-nourished.  HENT:  Head: Normocephalic.  Eyes: Pupils are equal, round, and reactive to light. Conjunctivae and EOM are normal.  Cardiovascular: Normal rate and regular rhythm.  Respiratory: Effort normal and breath sounds normal.  GI: Soft. Bowel sounds are normal.  Musculoskeletal:     Cervical back: Normal range of motion and neck supple.     Comments: Slight left drift in the upper extremity.  Lower extremity strength appears intact.  Reflexes are 2+ and symmetric Babinski on the  left is positive  Neurological: He is alert and oriented to person, place, and time.  Deep tendon reflexes are intact.  Cranial nerve examination appears intact with normal facial symmetry.  Tongue and uvula are in the midline.  Sclera and conjunctiva are clear.  The face is symmetric.  Gait is intact.  Skin: Skin is warm and dry.  Psychiatric: He has a normal mood and affect. His behavior is normal. Judgment and thought content normal.    Assessment/Plan: Large right frontal meningioma with 11 mm of shift.  Tumor measures 7 x 4 x 3 cm and is densely adherent to the right convexity.  There is significant vasogenic edema.  The patient will require surgical excision of this lesion for decompression.  This will be planned at the earliest possible convenience, possibly tomorrow.  Bus[place] 12/28/2019, 9:35 PM

## 2019-12-28 NOTE — ED Notes (Signed)
Gilda Crease POA # [phone]

## 2019-12-28 NOTE — ED Provider Notes (Signed)
[facility] Emergency Department Provider Note MRN:  [id number]  Arrival date & time: 12/28/19     Chief Complaint   Loss of Consciousness   History of Present Illness   Jeffery Reed is a 74 y.o. year-old male with a history of diabetes, hypertension presenting to the ED with chief complaint of LOC.  Unwitnessed fall, unknown if syncope versus mechanical fall.  Patient was reportedly sitting on the recliner staring into space while wife was trying to get his attention.  Eventually got his attention and told him to get up and answer the door to get there grub hub order.  Patient went through the garage and had an unwitnessed fall.  He does not recall getting up or going into the garage, no recollection of events leading to his fall.  Daughter at bedside notes that he is responding slower than normal to questions than normal currently.  He has had few months of cognitive decline recently.  At times having abnormal behavior and strange moods.  Forgetting things often as well.  Did not recognize family member today.  Review of Systems  A complete 10 system review of systems was obtained and all systems are negative except as noted in the HPI and PMH.   Patient's Health History    Past Medical History:  Diagnosis Date  . Allergy   . Anxiety   . Cataract    beginning stage 01/22/2019  . Chronic kidney disease    enlarge bladder  . Diabetes mellitus without complication ([place])   . GERD (gastroesophageal reflux disease)   . Gout   . Hyperlipidemia   . Hypertension   . OSA (obstructive sleep apnea)   . Pre-diabetes   . Sleep apnea    c pap    Past Surgical History:  Procedure Laterality Date  . COLONOSCOPY    . FOOT SURGERY  10/2009  . MYRINGOTOMY Left 02-2005   Dr.Newman  . TONSILLECTOMY  1953    Family History  Problem Relation Age of Onset  . Hypertension Mother   . GER disease Mother   . Hyperlipidemia Father   . Hypertension Father   .  Parkinson's disease Father   . Hypertension Brother   . Diabetes Maternal Grandmother   . Stroke Paternal Grandmother   . Diabetes Paternal Grandmother   . Stroke Paternal Grandfather   . Colon cancer Neg Hx   . Colon polyps Neg Hx   . Esophageal cancer Neg Hx   . Stomach cancer Neg Hx   . Rectal cancer Neg Hx     Social History   Socioeconomic History  . Marital status: Married    Spouse name: Not on file  . Number of children: Not on file  . Years of education: Not on file  . Highest education level: Not on file  Occupational History  . Not on file  Tobacco Use  . Smoking status: Never Smoker  . Smokeless tobacco: Never Used  Substance and Sexual Activity  . Alcohol use: No  . Drug use: No  . Sexual activity: Not on file  Other Topics Concern  . Not on file  Social History Narrative  . Not on file   Social Determinants of Health   Financial Resource Strain:   . Difficulty of Paying Living Expenses:   Food Insecurity:   . Worried About Charity fundraiser in the Last Year:   . Arboriculturist in the Last Year:   [employer]  Needs:   . Lack of Transportation (Medical):   Marland Kitchen Lack of Transportation (Non-Medical):   Physical Activity:   . Days of Exercise per Week:   . Minutes of Exercise per Session:   Stress:   . Feeling of Stress :   Social Connections:   . Frequency of Communication with Friends and Family:   . Frequency of Social Gatherings with Friends and Family:   . Attends Religious Services:   . Active Member of Clubs or Organizations:   . Attends Archivist Meetings:   Marland Kitchen Marital Status:   Intimate Partner Violence:   . Fear of Current or Ex-Partner:   . Emotionally Abused:   Marland Kitchen Physically Abused:   . Sexually Abused:      Physical Exam   Vitals:   12/28/19 1800 12/28/19 1900  BP: 129/72 113/73  Pulse: 94 95  Resp: (!) 24 17  Temp:    SpO2: 95% 92%    CONSTITUTIONAL: Well-appearing, NAD NEURO: Alert, oriented to name, moving  all extremities, slow to answer questions EYES:  eyes equal and reactive ENT/NECK:  no LAD, no JVD CARDIO: Regular rate, well-perfused, normal S1 and S2 PULM:  CTAB no wheezing or rhonchi GI/GU:  normal bowel sounds, non-distended, non-tender MSK/SPINE:  No gross deformities, no edema SKIN:  no rash, atraumatic PSYCH:  Appropriate speech and behavior  *Additional and/or pertinent findings included in MDM below  Diagnostic and Interventional Summary    EKG Interpretation  Date/Time:  Sunday Dec 28 2019 17:21:27 EDT Ventricular Rate:  95 PR Interval:    QRS Duration: 103 QT Interval:  367 QTC Calculation: 462 R Axis:   89 Text Interpretation: Sinus rhythm Borderline right axis deviation Abnormal R-wave progression, late transition Confirmed by Gerlene Fee [phone]) on 12/28/2019 5:46:30 PM      Labs Reviewed  CBC - Abnormal; Notable for the following components:      Result Value   WBC 10.9 (*)    HCT 38.9 (*)    All other components within normal limits  COMPREHENSIVE METABOLIC PANEL - Abnormal; Notable for the following components:   Glucose, Bld 248 (*)    Creatinine, Ser 1.70 (*)    Total Protein 6.3 (*)    GFR calc non Af Amer 39 (*)    GFR calc Af Amer 46 (*)    All other components within normal limits  RESPIRATORY PANEL BY RT PCR (FLU A&B, COVID)  URINALYSIS, ROUTINE W REFLEX MICROSCOPIC    CT HEAD WO CONTRAST  Final Result    CT CERVICAL SPINE WO CONTRAST  Final Result    MR Brain W and Wo Contrast    (Results Pending)    Medications  lidocaine (PF) (XYLOCAINE) 1 % injection 2 mL (has no administration in time range)  dexamethasone (DECADRON) injection 10 mg (has no administration in time range)     Procedures  /  Critical Care .Critical Care Performed by: Maudie Flakes, MD Authorized by: Maudie Flakes, MD   Critical care provider statement:    Critical care time (minutes):  32   Critical care was necessary to treat or prevent imminent or  life-threatening deterioration of the following conditions:  CNS failure or compromise   Critical care was time spent personally by me on the following activities:  Discussions with consultants, evaluation of patient's response to treatment, examination of patient, ordering and performing treatments and interventions, ordering and review of laboratory studies, ordering and review of radiographic studies, pulse  oximetry, re-evaluation of patient's condition, obtaining history from patient or surrogate and review of old charts .Marland KitchenLaceration Repair  Date/Time: 12/28/2019 7:27 PM Performed by: Maudie Flakes, MD Authorized by: Maudie Flakes, MD   Consent:    Consent obtained:  Verbal   Consent given by:  Patient   Risks discussed:  Infection, pain, poor cosmetic result, retained foreign body, tendon damage, vascular damage, poor wound healing, nerve damage and need for additional repair Anesthesia (see MAR for exact dosages):    Anesthesia method:  Local infiltration   Local anesthetic:  Lidocaine 1% w/o epi Laceration details:    Location:  Shoulder/arm   Arm location: Left elbow.   Length (cm):  2.6   Depth (mm):  2 Repair type:    Repair type:  Simple Pre-procedure details:    Preparation:  Patient was prepped and draped in usual sterile fashion Exploration:    Wound exploration: wound explored through full range of motion and entire depth of wound probed and visualized     Contaminated: no   Treatment:    Area cleansed with:  Saline   Amount of cleaning:  Standard   Irrigation method:  Syringe Skin repair:    Repair method:  Sutures   Suture size:  3-0   Suture material:  Nylon   Suture technique:  Simple interrupted   Number of sutures:  4 Approximation:    Approximation:  Close Post-procedure details:    Dressing:  Sterile dressing   Patient tolerance of procedure:  Tolerated well, no immediate complications    ED Course and Medical Decision Making  I have reviewed the  triage vital signs, the nursing notes, and pertinent available records from the EMR.  Listed above are laboratory and imaging tests that I personally ordered, reviewed, and interpreted and then considered in my medical decision making (see below for details).      Unclear if mechanical versus syncopal fall, patient also with some degree of altered mental status prior to the fall.  CT head pending, nonfocal neurological exam.  Suspect underlying dementia.  CT scan reveals large mass in the frontal lobe, calcified, possibly a meningioma causing mass-effect and shift.  Neurosurgery emergently paged, awaiting recommendations.  Patient continues to be neurologically nonfocal, conversant, able to ambulate.  Given the mass-effect will provide IV Decadron.  Discussed with Dr. Ellene Route of neurosurgery, who will evaluate the patient for surgical removal of tumor.  Given patient's history of diabetes, CKD and other comorbidities, will admit to internal medicine service.  Barth Kirks. Sedonia Small, [place] [email or web address]  Final Clinical Impressions(s) / ED Diagnoses     ICD-10-CM   1. Brain mass  G93.89   2. Neoplasm of brain causing mass effect on adjacent structures (HCC)  D49.6   3. Fall, initial encounter  W19.XXXA   4. Laceration of left elbow, initial encounter  S51.012A   5. Personality change in adult  F68.8     ED Discharge Orders    None       Discharge Instructions Discussed with and Provided to Patient:   Discharge Instructions   None       Maudie Flakes, MD 12/28/19 [phone]

## 2019-12-28 NOTE — ED Triage Notes (Signed)
Pt BIB [facility] for eval of syncopal episode. EMS reports that he was at home, walked outside into garage and had syncopal episode. Pt does not recall ambulating to garage, wife found him outside after not hearing from him for about 10 minutes. Pt w/ skin tear to L elbow that was wrapped by fire. EMS denies blood thinners. EMS reports slurred/thick speech which family confirmed on scene is baseline for pt. Wife confirms cognitive decline over last few months, but not officially dx'd w/ dementia. Pt arrives in c-collar, complaint free.

## 2019-12-28 NOTE — H&P (Signed)
History and Physical    Jeffery Reed [id number] DOB: 20-Mar-1946 DOA: 12/28/2019  PCP: Unk Pinto, MD Patient coming from: home  Chief Complaint: fall  I have personally briefly reviewed patient's old medical records in [place]  HPI:  Jeffery Reed is a 74 yo CM with PMH HTN, DMII, CKD, OSA who presented to the ER tonight after a fall at home and confusion. It was unknown if he syncopized and fell vs mechanical fall and hit head; patient unable to recall events.  The last thing he remembers is doing some yard work earlier today. His daughter is present bedside in the ER who helps provide further collateral information. She states that they have noticed a very slow gradual cognitive change since December however over the past 2 weeks he has been more forgetful and has been complaining of a daily headache for the past 1 week requiring taking 1 Tylenol arthritis daily which relieves his headache. There was some consideration of dementia however no formal work-up had been commenced.  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  He has only started over the past couple weeks. He does not have a significant fall history at home but does endorse that his left thigh has been more sore recently for unknown reasons.  He has been evaluated outpatient by Ortho with plans for an MRI which has not taken place as of yet.  He has also been participating in physical therapy with no improvement. Other than his headaches, he denies any vision changes/double vision.  He denies any focal weakness nor any numbness/tingling.  His balance is normal and appropriate.  He did fracture his right foot several years ago (2012) which he says does affect his balance at times.  Due to his altered mental status/fall, he underwent CT head upon arrival to the ER which revealed a large 6 x 2.7 x 4.5 cm partially calcified hyperdense mass in the right frontal lobe with  associated mass-effect and 1.1 cm right to left midline shift.   He then underwent MRI brain revealing: "7.0 x 2.9 x 3.1 cm extra axial mass overlying the right frontal convexity, most consistent with a meningioma. Associated mass effect with vasogenic edema within the underlying right cerebral hemisphere with up to 8 mm of right-to-left shift."  NSG was consulted from the ER to evaluate as well. Patient received 10 mg IV Decadron for vasogenic edema appreciated on brain imaging.   Assessment/Plan:  Right frontal lobe mass - underwent CTH and MRI brain in ER. MRI reveals 7 x 2.9 x 3.1 cm mass in right frontal lobe with vasogenic edema and R to L midline shift, 8 mm - recent HA likely related to mass; wife mentioned "staring spells" so will need close monitoring for seizure like activity as well; discussed with Dr. Ellene Route; will start patient on Keppra - s/p 10 mg IV decadron in ER; continue on decadron 4 mg q6h - NSG, Dr. Ellene Route consulted from ER; plan is for tentative surgery on 5/10 in afternoon - q4h neurochecks  HTN - use PRN agents for now; patient NPO for now  DMII - check A1c - continue on SSI and CBGs  CKDIII - baseline ~1.3-1.5, currently 1.7 on admission - start IVF - BMP in am - avoid nephrotoxic agents as able   OSA - RT eval  Code Status:  Full DVT Prophylaxis:SCD Anticipated disposition is: pending clinical course  History: Past Medical History:  Diagnosis  Date  . Allergy   . Anxiety   . Cataract    beginning stage 01/22/2019  . Chronic kidney disease    enlarge bladder  . Diabetes mellitus without complication ([place])   . GERD (gastroesophageal reflux disease)   . Gout   . Hyperlipidemia   . Hypertension   . OSA (obstructive sleep apnea)   . Pre-diabetes   . Sleep apnea    c pap    Past Surgical History:  Procedure Laterality Date  . COLONOSCOPY    . FOOT SURGERY  10/2009  . MYRINGOTOMY Left 02-2005   Dr.Newman  . TONSILLECTOMY  1953      reports that he has never smoked. He has never used smokeless tobacco. He reports that he does not drink alcohol or use drugs.  Allergies  Allergen Reactions  . Cardura [Doxazosin Mesylate] Other (See Comments)    Nasal congestion  . Codeine Other (See Comments)    Pt passes out.  Marland Kitchen Hytrin [Terazosin]     Family History  Problem Relation Age of Onset  . Hypertension Mother   . GER disease Mother   . Hyperlipidemia Father   . Hypertension Father   . Parkinson's disease Father   . Hypertension Brother   . Diabetes Maternal Grandmother   . Stroke Paternal Grandmother   . Diabetes Paternal Grandmother   . Stroke Paternal Grandfather   . Colon cancer Neg Hx   . Colon polyps Neg Hx   . Esophageal cancer Neg Hx   . Stomach cancer Neg Hx   . Rectal cancer Neg Hx    Home Medications: Prior to Admission medications   Medication Sig Start Date End Date Taking? Authorizing Provider  acetaminophen (TYLENOL) 325 MG tablet Take 650 mg by mouth every 6 (six) hours as needed for mild pain.    [provider]  ALPRAZolam Duanne Moron) 1 MG tablet Take 1/2-1 tablet 2 - 3 x /day ONLY if needed for Anxiety Attack &  limit to 5 days /week to avoid addiction & Do not take while Driving [id number]   Unk Pinto, MD  Cholecalciferol (VITAMIN D3) 10000 units capsule Take 10,000 Units by mouth daily.    [provider]  Continuous Blood Gluc Sensor (FREESTYLE LIBRE SENSOR SYSTEM) MISC Check sugars 3 x a day DX E10.9 07/16/19   Vicie Mutters, PA-C  fenofibrate 160 MG tablet TAKE 1 TABLET BY MOUTH  DAILY FOR BLOOD FATS 06/11/19   Liane Comber, NP  fluticasone Asencion Islam) 50 MCG/ACT nasal spray Use 2 sprays each nostril  /daily 04/18/18   Unk Pinto, MD  glipiZIDE (GLUCOTROL) 5 MG tablet Take 1 tablet 2 x   /day with Meals for Diabetes 12/10/19   Unk Pinto, MD  glucose blood (FREESTYLE LITE) test strip Check blood sugar 1 time daily-DX-E11.22 11/27/17   Unk Pinto, MD    metFORMIN (GLUCOPHAGE-XR) 500 MG 24 hr tablet Take 2 tablets a day with food, decrease due to kidney function 07/16/19   Vicie Mutters, PA-C  mupirocin ointment (BACTROBAN) 2 % APPLY TWICE A DAY TO SKIN INFECTION. 12/19/15   Unk Pinto, MD  omeprazole (PRILOSEC) 40 MG capsule TAKE 1 CAPSULE BY MOUTH  DAILY FOR ACID REFLUX 06/11/19   Liane Comber, NP  oxybutynin (DITROPAN) 5 MG tablet Take 1 tablet 3 x /day  For Bladder Control 05/04/19   Unk Pinto, MD  rosuvastatin (CRESTOR) 40 MG tablet Take 0.5 tablets (20 mg total) by mouth daily. 09/19/19   Vicie Mutters, PA-C  tamsulosin (FLOMAX) 0.4 MG CAPS capsule Take 1 capsule (0.4 mg total) by mouth daily. 08/18/15   Unk Pinto, MD  valsartan-hydrochlorothiazide (DIOVAN-HCT) 320-25 MG tablet Take 1 tablet Daily for BP 06/13/19   Unk Pinto, MD    Review of Systems:  Review of Systems - General ROS: negative for - chills, fatigue or fever Respiratory ROS: negative for - cough or shortness of breath Cardiovascular ROS: negative for - chest pain Gastrointestinal ROS: negative for - abdominal pain Neuro: positive for headaches  Physical Exam: Vitals:   12/28/19 1712 12/28/19 1727 12/28/19 1800 12/28/19 1900  BP:  129/72 129/72 113/73  Pulse:  96 94 95  Resp:  18 (!) 24 17  Temp:  97.9 F (36.6 C)    TempSrc:  Oral    SpO2:  93% 95% 92%  Weight: 102.1 kg     Height: 5\' 10"  (1.778 m)      General appearance: Pleasant elderly man resting in bed in no distress Head: Normocephalic, without obvious abnormality Eyes: EOMI, PERRLA Lungs: clear to auscultation bilaterally Heart: regular rate and rhythm and S1, S2 normal Abdomen: normal findings: bowel sounds normal and soft, non-tender Extremities: No edema, DP pulses intact Skin: mobility and turgor normal Neurologic: subtle LUE weakness (4+/5), no paresthesias, gait deferred  Labs on Admission:  I have personally reviewed following labs and imaging  studies Results for orders placed or performed during the hospital encounter of 12/28/19 (from the past 24 hour(s))  CBC     Status: Abnormal   Collection Time: 12/28/19  6:12 PM  Result Value Ref Range   WBC 10.9 (H) 4.0 - 10.5 K/uL   RBC 4.53 4.22 - 5.81 MIL/uL   Hemoglobin 13.3 13.0 - 17.0 g/dL   HCT 38.9 (L) 39.0 - 52.0 %   MCV 85.9 80.0 - 100.0 fL   MCH 29.4 26.0 - 34.0 pg   MCHC 34.2 30.0 - 36.0 g/dL   RDW 13.9 11.5 - 15.5 %   Platelets 171 150 - 400 K/uL   nRBC 0.0 0.0 - 0.2 %  Comprehensive metabolic panel     Status: Abnormal   Collection Time: 12/28/19  6:12 PM  Result Value Ref Range   Sodium 140 135 - 145 mmol/L   Potassium 3.8 3.5 - 5.1 mmol/L   Chloride 105 98 - 111 mmol/L   CO2 23 22 - 32 mmol/L   Glucose, Bld 248 (H) 70 - 99 mg/dL   BUN 21 8 - 23 mg/dL   Creatinine, Ser 1.70 (H) 0.61 - 1.24 mg/dL   Calcium 9.0 8.9 - 10.3 mg/dL   Total Protein 6.3 (L) 6.5 - 8.1 g/dL   Albumin 3.7 3.5 - 5.0 g/dL   AST 29 15 - 41 U/L   ALT 17 0 - 44 U/L   Alkaline Phosphatase 48 38 - 126 U/L   Total Bilirubin 0.9 0.3 - 1.2 mg/dL   GFR calc non Af Amer 39 (L) >60 mL/min   GFR calc Af Amer 46 (L) >60 mL/min   Anion gap 12 5 - 15     Radiological Exams on Admission: CT HEAD WO CONTRAST  Result Date: 12/28/2019 CLINICAL DATA:  Found on the floor. EXAM: CT HEAD WITHOUT CONTRAST TECHNIQUE: Contiguous axial images were obtained from the base of the skull through the vertex without intravenous contrast. COMPARISON:  September 23, 2008 FINDINGS: Brain: No evidence of acute infarction, hemorrhage or hydrocephalus. A 6.0 cm x 2.7 cm x 4.5 cm partially calcified  heterogeneous, mildly hyperdense mass is seen within the right frontal lobe. This represents a new finding when compared to the prior study. There is associated mass effect on the adjacent sulci with 1.1 cm right to left midline shift. A moderate amount of adjacent white matter low attenuation is seen within the right frontal lobe  Vascular: No hyperdense vessels are identified. Skull: Normal. Negative for fracture or focal lesion. Sinuses/Orbits: No acute finding. Other: None. IMPRESSION: Large, 6.0 cm x 2.7 cm x 4.5 cm partially calcified heterogeneous, mildly hyperdense mass within the right frontal lobe with associated mass effect on the adjacent sulci and 1.1 cm right to left midline shift. This is new when compared to the prior study. While this may represent a large meningioma, MRI correlation is recommended. Electronically Signed   By: Virgina Norfolk M.D.   On: 12/28/2019 18:09   CT CERVICAL SPINE WO CONTRAST  Result Date: 12/28/2019 CLINICAL DATA:  Found on floor. EXAM: CT CERVICAL SPINE WITHOUT CONTRAST TECHNIQUE: Multidetector CT imaging of the cervical spine was performed without intravenous contrast. Multiplanar CT image reconstructions were also generated. COMPARISON:  None. FINDINGS: Alignment: Normal. Skull base and vertebrae: No acute fracture. No primary bone lesion or focal pathologic process. Soft tissues and spinal canal: No prevertebral fluid or swelling. No visible canal hematoma. Disc levels: Moderate severity endplate sclerosis is seen at the levels of C4-C5, C5-C6 and C6-C7. Mild to moderate severity intervertebral disc space narrowing is also seen at these levels. There is mild bilateral multilevel facet joint hypertrophy. Upper chest: Negative. Other: None. IMPRESSION: 1. No acute osseous abnormality of the cervical spine. 2. Moderate severity multilevel degenerative changes, most prominent at the levels of C4-C5, C5-C6 and C6-C7. Electronically Signed   By: Virgina Norfolk M.D.   On: 12/28/2019 18:13   CT HEAD WO CONTRAST  Final Result    CT CERVICAL SPINE WO CONTRAST  Final Result    MR Brain W and Wo Contrast    (Results Pending)    Consults called:  NSG- Dr. Ellene Route   EKG: Independently reviewed. NSR, normal axis    Dwyane Dee, MD [facility] Pager: Secure chat via [place]  pager # : [phone]  If 7PM-7AM, please contact night-coverage [email or web address] Use universal [place] password for that web site. If you do not have the password, please call the hospital operator.  12/28/2019, 8:12 PM

## 2019-12-28 NOTE — Hospital Course (Addendum)
Jeffery Reed is a 74 yo CM with PMH HTN, DMII, CKDIII, OSA who presented to the ER tonight after a fall at home and confusion. It was unknown if he syncopized and fell vs mechanical fall and hit head; patient unable to recall events.  The last thing he remembers is doing some yard work earlier today. His daughter is present bedside in the ER who helps provide further collateral information. She states that they have noticed a very slow gradual cognitive change since December however over the past 2 weeks he has been more forgetful and has been complaining of a daily headache for the past 1 week requiring taking 1 Tylenol arthritis daily which relieves his headache. There was some consideration of dementia however no formal work-up had been commenced.   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  He has only started over the past couple weeks. He does not have a significant fall history at home but does endorse that his left thigh has been more sore recently for unknown reasons.  He has been evaluated outpatient by Ortho with plans for an MRI which has not taken place as of yet.  He has also been participating in physical therapy with no improvement. Other than his headaches, he denies any vision changes/double vision.  He denies any focal weakness nor any numbness/tingling.  His balance is normal and appropriate.  He did fracture his right foot several years ago (2012) which he says does affect his balance at times.   Due to his altered mental status/fall, he underwent CT head upon arrival to the ER which revealed a large 6 x 2.7 x 4.5 cm partially calcified hyperdense mass in the right frontal lobe with associated mass-effect and 1.1 cm right to left midline shift.   He then underwent MRI brain revealing: "7.0 x 2.9 x 3.1 cm extra axial mass overlying the right frontal convexity, most consistent with a meningioma. Associated mass effect with vasogenic edema within  the underlying right cerebral hemisphere with up to 8 mm of right-to-left shift."   NSG was consulted from the ER to evaluate as well. Patient received 10 mg IV Decadron for vasogenic edema appreciated on brain imaging

## 2019-12-29 ENCOUNTER — Inpatient Hospital Stay (HOSPITAL_COMMUNITY): Payer: Medicare Other | Admitting: Certified Registered Nurse Anesthetist

## 2019-12-29 ENCOUNTER — Encounter (HOSPITAL_COMMUNITY): Payer: Self-pay | Admitting: Internal Medicine

## 2019-12-29 ENCOUNTER — Inpatient Hospital Stay (HOSPITAL_COMMUNITY)
Admission: EM | Disposition: A | Discharge status: Home / Self Care | Payer: Self-pay | Source: Home / Self Care | Attending: Internal Medicine

## 2019-12-29 HISTORY — PX: CRANIOTOMY: SHX93

## 2019-12-29 LAB — GLUCOSE, CAPILLARY
Glucose-Capillary: 347 mg/dL — ABNORMAL HIGH (ref 70–99)
Glucose-Capillary: 243 mg/dL — ABNORMAL HIGH (ref 70–99)
Glucose-Capillary: 230 mg/dL — ABNORMAL HIGH (ref 70–99)
Glucose-Capillary: 200 mg/dL — ABNORMAL HIGH (ref 70–99)
Glucose-Capillary: 291 mg/dL — ABNORMAL HIGH (ref 70–99)
Glucose-Capillary: 312 mg/dL — ABNORMAL HIGH (ref 70–99)

## 2019-12-29 LAB — POCT I-STAT, CHEM 8
BUN: 20 mg/dL (ref 8–23)
Calcium, Ion: 1.18 mmol/L (ref 1.15–1.40)
Chloride: 105 mmol/L (ref 98–111)
Creatinine, Ser: 1.4 mg/dL — ABNORMAL HIGH (ref 0.61–1.24)
Glucose, Bld: 243 mg/dL — ABNORMAL HIGH (ref 70–99)
HCT: 33 % — ABNORMAL LOW (ref 39.0–52.0)
Hemoglobin: 11.2 g/dL — ABNORMAL LOW (ref 13.0–17.0)
Potassium: 3.8 mmol/L (ref 3.5–5.1)
Sodium: 141 mmol/L (ref 135–145)
TCO2: 20 mmol/L — ABNORMAL LOW (ref 22–32)

## 2019-12-29 LAB — ABO/RH: ABO/RH(D): O POS

## 2019-12-29 LAB — POCT I-STAT 7, (LYTES, BLD GAS, ICA,H+H)
Acid-base deficit: 5 mmol/L — ABNORMAL HIGH (ref 0.0–2.0)
Bicarbonate: 21.6 mmol/L (ref 20.0–28.0)
Calcium, Ion: 1.11 mmol/L — ABNORMAL LOW (ref 1.15–1.40)
HCT: 32 % — ABNORMAL LOW (ref 39.0–52.0)
Hemoglobin: 10.9 g/dL — ABNORMAL LOW (ref 13.0–17.0)
O2 Saturation: 100 %
Patient temperature: 36.5
Potassium: 4.2 mmol/L (ref 3.5–5.1)
Sodium: 142 mmol/L (ref 135–145)
TCO2: 23 mmol/L (ref 22–32)
pCO2 arterial: 42.2 mmHg (ref 32.0–48.0)
pH, Arterial: 7.314 — ABNORMAL LOW (ref 7.350–7.450)
pO2, Arterial: 230 mmHg — ABNORMAL HIGH (ref 83.0–108.0)

## 2019-12-29 LAB — CBC
HCT: 40.5 % (ref 39.0–52.0)
Hemoglobin: 13.7 g/dL (ref 13.0–17.0)
MCH: 29 pg (ref 26.0–34.0)
MCHC: 33.8 g/dL (ref 30.0–36.0)
MCV: 85.8 fL (ref 80.0–100.0)
Platelets: 173 10*3/uL (ref 150–400)
RBC: 4.72 MIL/uL (ref 4.22–5.81)
RDW: 13.9 % (ref 11.5–15.5)
WBC: 9.1 10*3/uL (ref 4.0–10.5)
nRBC: 0 % (ref 0.0–0.2)

## 2019-12-29 LAB — BASIC METABOLIC PANEL
Anion gap: 11 (ref 5–15)
BUN: 20 mg/dL (ref 8–23)
CO2: 24 mmol/L (ref 22–32)
Calcium: 9.2 mg/dL (ref 8.9–10.3)
Chloride: 104 mmol/L (ref 98–111)
Creatinine, Ser: 1.59 mg/dL — ABNORMAL HIGH (ref 0.61–1.24)
GFR calc Af Amer: 50 mL/min — ABNORMAL LOW (ref >60)
GFR calc non Af Amer: 43 mL/min — ABNORMAL LOW (ref >60)
Glucose, Bld: 347 mg/dL — ABNORMAL HIGH (ref 70–99)
Potassium: 3.9 mmol/L (ref 3.5–5.1)
Sodium: 139 mmol/L (ref 135–145)

## 2019-12-29 LAB — HEMOGLOBIN A1C
Hgb A1c MFr Bld: 8.1 % — ABNORMAL HIGH (ref 4.8–5.6)
Mean Plasma Glucose: 185.77 mg/dL

## 2019-12-29 LAB — PREPARE RBC (CROSSMATCH)

## 2019-12-29 LAB — MAGNESIUM: Magnesium: 1.9 mg/dL (ref 1.7–2.4)

## 2019-12-29 SURGERY — CRANIOTOMY TUMOR EXCISION
Anesthesia: General | Site: Head | Laterality: Right

## 2019-12-29 MED ORDER — LIDOCAINE-EPINEPHRINE 1 %-1:100000 IJ SOLN
INTRAMUSCULAR | Status: DC | PRN
  Administered 2019-12-29: 3 mL

## 2019-12-29 MED ORDER — FENTANYL CITRATE (PF) 250 MCG/5ML IJ SOLN
INTRAMUSCULAR | Status: AC
  Filled 2019-12-29: qty 5

## 2019-12-29 MED ORDER — CEFAZOLIN SODIUM 1 G IJ SOLR
INTRAMUSCULAR | Status: AC
  Filled 2019-12-29: qty 40

## 2019-12-29 MED ORDER — PHENYLEPHRINE HCL-NACL 10-0.9 MG/250ML-% IV SOLN
INTRAVENOUS | Status: DC | PRN
Start: 2019-12-29 — End: 2019-12-29
  Administered 2019-12-29: 20 ug/min via INTRAVENOUS

## 2019-12-29 MED ORDER — DEXAMETHASONE SODIUM PHOSPHATE 10 MG/ML IJ SOLN
INTRAMUSCULAR | Status: AC
  Filled 2019-12-29: qty 1

## 2019-12-29 MED ORDER — DOCUSATE SODIUM 100 MG PO CAPS
100.0000 mg | ORAL_CAPSULE | Freq: Two times a day (BID) | ORAL | Status: DC
  Administered 2019-12-30 – 2020-01-02 (×6): 100 mg via ORAL
  Filled 2019-12-29 (×7): qty 1

## 2019-12-29 MED ORDER — BISACODYL 10 MG RE SUPP: 10.0000 mg | Freq: Every day | RECTAL | Status: DC | PRN

## 2019-12-29 MED ORDER — LABETALOL HCL 5 MG/ML IV SOLN
10.0000 mg | INTRAVENOUS | Status: DC | PRN
  Administered 2019-12-29 – 2019-12-30 (×2): 10 mg via INTRAVENOUS
  Filled 2019-12-29 (×2): qty 4

## 2019-12-29 MED ORDER — INSULIN GLARGINE 100 UNIT/ML ~~LOC~~ SOLN: 10.0000 [IU] | Freq: Every day | SUBCUTANEOUS | Status: DC

## 2019-12-29 MED ORDER — LABETALOL HCL 5 MG/ML IV SOLN
10.0000 mg | INTRAVENOUS | Status: DC | PRN
  Administered 2019-12-29: 10 mg via INTRAVENOUS

## 2019-12-29 MED ORDER — HEMOSTATIC AGENTS (NO CHARGE) OPTIME
TOPICAL | Status: DC | PRN
  Administered 2019-12-29: 1 via TOPICAL

## 2019-12-29 MED ORDER — PANTOPRAZOLE SODIUM 40 MG IV SOLR
40.0000 mg | Freq: Every day | INTRAVENOUS | Status: DC
  Administered 2019-12-29 – 2019-12-30 (×2): 40 mg via INTRAVENOUS
  Filled 2019-12-29 (×2): qty 40

## 2019-12-29 MED ORDER — THROMBIN 20000 UNITS EX KIT
PACK | CUTANEOUS | Status: AC
  Filled 2019-12-29: qty 1

## 2019-12-29 MED ORDER — LABETALOL HCL 5 MG/ML IV SOLN: 10.0000 mg | INTRAVENOUS | Status: DC | PRN

## 2019-12-29 MED ORDER — SODIUM CHLORIDE 0.9 % IV SOLN: INTRAVENOUS | Status: DC

## 2019-12-29 MED ORDER — FENTANYL CITRATE (PF) 100 MCG/2ML IJ SOLN
INTRAMUSCULAR | Status: DC | PRN
  Administered 2019-12-29: 150 ug via INTRAVENOUS
  Administered 2019-12-29 (×2): 50 ug via INTRAVENOUS
  Administered 2019-12-29: 100 ug via INTRAVENOUS
  Administered 2019-12-29 (×2): 50 ug via INTRAVENOUS

## 2019-12-29 MED ORDER — PROPOFOL 10 MG/ML IV BOLUS
INTRAVENOUS | Status: DC | PRN
  Administered 2019-12-29: 150 mg via INTRAVENOUS
  Administered 2019-12-29: 50 mg via INTRAVENOUS

## 2019-12-29 MED ORDER — LABETALOL HCL 5 MG/ML IV SOLN
INTRAVENOUS | Status: AC
  Filled 2019-12-29: qty 4

## 2019-12-29 MED ORDER — BUPIVACAINE HCL (PF) 0.5 % IJ SOLN
INTRAMUSCULAR | Status: AC
  Filled 2019-12-29: qty 30

## 2019-12-29 MED ORDER — LEVETIRACETAM IN NACL 500 MG/100ML IV SOLN
500.0000 mg | Freq: Two times a day (BID) | INTRAVENOUS | Status: DC
  Administered 2019-12-29: 500 mg via INTRAVENOUS

## 2019-12-29 MED ORDER — BACITRACIN ZINC 500 UNIT/GM EX OINT
TOPICAL_OINTMENT | CUTANEOUS | Status: AC
  Filled 2019-12-29: qty 28.35

## 2019-12-29 MED ORDER — BACITRACIN ZINC 500 UNIT/GM EX OINT
TOPICAL_OINTMENT | CUTANEOUS | Status: DC | PRN
  Administered 2019-12-29: 1 via TOPICAL

## 2019-12-29 MED ORDER — THROMBIN 20000 UNITS EX SOLR
CUTANEOUS | Status: AC
  Filled 2019-12-29: qty 20000

## 2019-12-29 MED ORDER — ROCURONIUM BROMIDE 100 MG/10ML IV SOLN
INTRAVENOUS | Status: DC | PRN
  Administered 2019-12-29: 100 mg via INTRAVENOUS
  Administered 2019-12-29: 50 mg via INTRAVENOUS

## 2019-12-29 MED ORDER — DEXAMETHASONE 4 MG PO TABS
4.0000 mg | ORAL_TABLET | Freq: Three times a day (TID) | ORAL | Status: AC
  Administered 2019-12-31 (×3): 4 mg via ORAL
  Filled 2019-12-29 (×3): qty 1

## 2019-12-29 MED ORDER — SODIUM CHLORIDE 0.9 % IV SOLN
INTRAVENOUS | Status: DC | PRN
  Administered 2019-12-29: 500 mL

## 2019-12-29 MED ORDER — DEXAMETHASONE 4 MG PO TABS
4.0000 mg | ORAL_TABLET | Freq: Two times a day (BID) | ORAL | Status: DC
  Administered 2020-01-01 – 2020-01-02 (×3): 4 mg via ORAL
  Filled 2019-12-29 (×3): qty 1

## 2019-12-29 MED ORDER — THROMBIN 5000 UNITS EX SOLR
CUTANEOUS | Status: AC
  Filled 2019-12-29: qty 5000

## 2019-12-29 MED ORDER — LIDOCAINE 2% (20 MG/ML) 5 ML SYRINGE
INTRAMUSCULAR | Status: AC
  Filled 2019-12-29: qty 5

## 2019-12-29 MED ORDER — LIDOCAINE HCL (CARDIAC) PF 100 MG/5ML IV SOSY
PREFILLED_SYRINGE | INTRAVENOUS | Status: DC | PRN
  Administered 2019-12-29: 50 mg via INTRAVENOUS

## 2019-12-29 MED ORDER — SUGAMMADEX SODIUM 200 MG/2ML IV SOLN
INTRAVENOUS | Status: DC | PRN
  Administered 2019-12-29: 200 mg via INTRAVENOUS

## 2019-12-29 MED ORDER — ONDANSETRON HCL 4 MG PO TABS: 4.0000 mg | ORAL_TABLET | ORAL | Status: DC | PRN

## 2019-12-29 MED ORDER — SENNA 8.6 MG PO TABS
1.0000 | ORAL_TABLET | Freq: Two times a day (BID) | ORAL | Status: DC
  Administered 2019-12-30 – 2020-01-02 (×6): 8.6 mg via ORAL
  Filled 2019-12-29 (×6): qty 1

## 2019-12-29 MED ORDER — PHENYLEPHRINE 40 MCG/ML (10ML) SYRINGE FOR IV PUSH (FOR BLOOD PRESSURE SUPPORT)
PREFILLED_SYRINGE | INTRAVENOUS | Status: AC
  Filled 2019-12-29: qty 10

## 2019-12-29 MED ORDER — ONDANSETRON HCL 4 MG/2ML IJ SOLN
INTRAMUSCULAR | Status: AC
  Filled 2019-12-29: qty 2

## 2019-12-29 MED ORDER — ALBUMIN HUMAN 5 % IV SOLN
INTRAVENOUS | Status: DC | PRN
Start: 2019-12-29 — End: 2019-12-29

## 2019-12-29 MED ORDER — AMISULPRIDE (ANTIEMETIC) 5 MG/2ML IV SOLN
INTRAVENOUS | Status: AC
  Filled 2019-12-29: qty 4

## 2019-12-29 MED ORDER — AMISULPRIDE (ANTIEMETIC) 5 MG/2ML IV SOLN
10.0000 mg | Freq: Once | INTRAVENOUS | Status: AC
  Administered 2019-12-29: 21:00:00 10 mg via INTRAVENOUS

## 2019-12-29 MED ORDER — PROMETHAZINE HCL 25 MG PO TABS: 12.5000 mg | ORAL_TABLET | ORAL | Status: DC | PRN

## 2019-12-29 MED ORDER — MORPHINE SULFATE (PF) 2 MG/ML IV SOLN
1.0000 mg | INTRAVENOUS | Status: DC | PRN
  Administered 2019-12-30: 1 mg via INTRAVENOUS
  Filled 2019-12-29: qty 1

## 2019-12-29 MED ORDER — ONDANSETRON HCL 4 MG/2ML IJ SOLN
INTRAMUSCULAR | Status: DC | PRN
  Administered 2019-12-29: 4 mg via INTRAVENOUS

## 2019-12-29 MED ORDER — BUPIVACAINE HCL (PF) 0.5 % IJ SOLN
INTRAMUSCULAR | Status: DC | PRN
  Administered 2019-12-29: 3 mL

## 2019-12-29 MED ORDER — ONDANSETRON HCL 4 MG/2ML IJ SOLN: 4.0000 mg | INTRAMUSCULAR | Status: DC | PRN

## 2019-12-29 MED ORDER — HYDROCODONE-ACETAMINOPHEN 5-325 MG PO TABS
1.0000 | ORAL_TABLET | ORAL | Status: DC | PRN
  Administered 2019-12-31: 1 via ORAL
  Filled 2019-12-29: qty 1

## 2019-12-29 MED ORDER — SUCCINYLCHOLINE CHLORIDE 20 MG/ML IJ SOLN
INTRAMUSCULAR | Status: DC | PRN
  Administered 2019-12-29: 120 mg via INTRAVENOUS

## 2019-12-29 MED ORDER — ESMOLOL HCL 100 MG/10ML IV SOLN
INTRAVENOUS | Status: AC
  Filled 2019-12-29: qty 10

## 2019-12-29 MED ORDER — PROPOFOL 10 MG/ML IV BOLUS
INTRAVENOUS | Status: AC
  Filled 2019-12-29: qty 20

## 2019-12-29 MED ORDER — 0.9 % SODIUM CHLORIDE (POUR BTL) OPTIME
TOPICAL | Status: DC | PRN
  Administered 2019-12-29: 1000 mL
  Administered 2019-12-29: 2000 mL

## 2019-12-29 MED ORDER — CHLORHEXIDINE GLUCONATE CLOTH 2 % EX PADS
6.0000 | MEDICATED_PAD | Freq: Every day | CUTANEOUS | Status: DC
  Administered 2019-12-30 – 2020-01-01 (×3): 6 via TOPICAL

## 2019-12-29 MED ORDER — LIDOCAINE-EPINEPHRINE 1 %-1:100000 IJ SOLN
INTRAMUSCULAR | Status: AC
  Filled 2019-12-29: qty 1

## 2019-12-29 MED ORDER — DEXAMETHASONE 4 MG PO TABS
4.0000 mg | ORAL_TABLET | Freq: Four times a day (QID) | ORAL | Status: AC
  Administered 2019-12-30: 4 mg via ORAL
  Filled 2019-12-29: qty 1

## 2019-12-29 MED ORDER — CEFAZOLIN SODIUM-DEXTROSE 1-4 GM/50ML-% IV SOLN
1.0000 g | Freq: Three times a day (TID) | INTRAVENOUS | Status: DC
  Administered 2019-12-29 – 2020-01-02 (×11): 1 g via INTRAVENOUS
  Filled 2019-12-29 (×13): qty 50

## 2019-12-29 MED ORDER — THROMBIN 20000 UNITS EX SOLR
CUTANEOUS | Status: DC | PRN
  Administered 2019-12-29 (×3): 20 mL

## 2019-12-29 MED ORDER — CEFAZOLIN SODIUM-DEXTROSE 2-3 GM-%(50ML) IV SOLR
INTRAVENOUS | Status: DC | PRN
  Administered 2019-12-29: 2 g via INTRAVENOUS

## 2019-12-29 MED ORDER — SODIUM CHLORIDE 0.9% IV SOLUTION: Freq: Once | INTRAVENOUS | Status: DC

## 2019-12-29 MED ORDER — POLYETHYLENE GLYCOL 3350 17 G PO PACK: 17.0000 g | PACK | Freq: Every day | ORAL | Status: DC | PRN

## 2019-12-29 MED ORDER — INSULIN GLARGINE 100 UNIT/ML ~~LOC~~ SOLN
5.0000 [IU] | Freq: Every day | SUBCUTANEOUS | Status: DC
  Administered 2019-12-30: 5 [IU] via SUBCUTANEOUS
  Filled 2019-12-29 (×2): qty 0.05

## 2019-12-29 MED ORDER — THROMBIN 5000 UNITS EX SOLR
OROMUCOSAL | Status: DC | PRN
  Administered 2019-12-29 (×3): 5 mL

## 2019-12-29 MED ORDER — ESMOLOL HCL 100 MG/10ML IV SOLN
INTRAVENOUS | Status: DC | PRN
  Administered 2019-12-29: 30 mg via INTRAVENOUS
  Administered 2019-12-29: 20 mg via INTRAVENOUS

## 2019-12-29 MED ORDER — FLEET ENEMA 7-19 GM/118ML RE ENEM: 1.0000 | ENEMA | Freq: Once | RECTAL | Status: DC | PRN

## 2019-12-29 SURGICAL SUPPLY — 78 items
BAG DECANTER FOR FLEXI CONT (MISCELLANEOUS) ×4 IMPLANT
BIT DRILL WIRE PASS 1.3MM (BIT) IMPLANT
BLADE CLIPPER SURG (BLADE) ×12 IMPLANT
BNDG GAUZE ELAST 4 BULKY (GAUZE/BANDAGES/DRESSINGS) ×6 IMPLANT
BUR ACORN 6.0 PRECISION (BURR) ×3 IMPLANT
BUR ACORN 6.0MM PRECISION (BURR) ×1
BUR SPIRAL ROUTER 2.3 (BUR) ×3 IMPLANT
BUR SPIRAL ROUTER 2.3MM (BUR) ×1
CANISTER SUCT 3000ML PPV (MISCELLANEOUS) ×4 IMPLANT
CLIP VESOCCLUDE MED 6/CT (CLIP) IMPLANT
CNTNR URN SCR LID CUP LEK RST (MISCELLANEOUS) ×2 IMPLANT
CONT SPEC 4OZ STRL OR WHT (MISCELLANEOUS) ×8
COTTONBALL LRG STERILE PKG (GAUZE/BANDAGES/DRESSINGS) IMPLANT
COVER BACK TABLE 60X90IN (DRAPES) ×8 IMPLANT
DECANTER SPIKE VIAL GLASS SM (MISCELLANEOUS) ×4 IMPLANT
DRAIN CHANNEL 10M FLAT 3/4 FLT (DRAIN) IMPLANT
DRAIN SUBARACHNOID (WOUND CARE) IMPLANT
DRAPE MICROSCOPE LEICA (MISCELLANEOUS) IMPLANT
DRAPE WARM FLUID 44X44 (DRAPES) ×4 IMPLANT
DRILL WIRE PASS 1.3MM (BIT)
DRSG ADAPTIC 3X8 NADH LF (GAUZE/BANDAGES/DRESSINGS) ×4 IMPLANT
DURAPREP 6ML APPLICATOR 50/CS (WOUND CARE) ×4 IMPLANT
ELECT REM PT RETURN 9FT ADLT (ELECTROSURGICAL) ×4
ELECTRODE REM PT RTRN 9FT ADLT (ELECTROSURGICAL) ×2 IMPLANT
EVACUATOR 1/8 PVC DRAIN (DRAIN) IMPLANT
EVACUATOR SILICONE 100CC (DRAIN) IMPLANT
FORCEPS BIPOLAR SPETZLER 8 1.0 (NEUROSURGERY SUPPLIES) ×2 IMPLANT
GAUZE 4X4 16PLY RFD (DISPOSABLE) ×2 IMPLANT
GAUZE SPONGE 4X4 12PLY STRL (GAUZE/BANDAGES/DRESSINGS) ×2 IMPLANT
GLOVE BIO SURGEON STRL SZ 6.5 (GLOVE) ×2 IMPLANT
GLOVE BIO SURGEON STRL SZ7 (GLOVE) ×2 IMPLANT
GLOVE BIO SURGEONS STRL SZ 6.5 (GLOVE) ×2
GLOVE BIOGEL PI IND STRL 6.5 (GLOVE) IMPLANT
GLOVE BIOGEL PI IND STRL 7.0 (GLOVE) IMPLANT
GLOVE BIOGEL PI IND STRL 7.5 (GLOVE) IMPLANT
GLOVE BIOGEL PI IND STRL 8.5 (GLOVE) ×2 IMPLANT
GLOVE BIOGEL PI INDICATOR 6.5 (GLOVE) ×4
GLOVE BIOGEL PI INDICATOR 7.0 (GLOVE) ×4
GLOVE BIOGEL PI INDICATOR 7.5 (GLOVE) ×4
GLOVE BIOGEL PI INDICATOR 8.5 (GLOVE) ×2
GLOVE ECLIPSE 8.5 STRL (GLOVE) ×4 IMPLANT
GLOVE ECLIPSE 9.0 STRL (GLOVE) ×2 IMPLANT
GLOVE EXAM NITRILE XL STR (GLOVE) IMPLANT
GOWN STRL REUS W/ TWL LRG LVL3 (GOWN DISPOSABLE) IMPLANT
GOWN STRL REUS W/ TWL XL LVL3 (GOWN DISPOSABLE) ×2 IMPLANT
GOWN STRL REUS W/TWL 2XL LVL3 (GOWN DISPOSABLE) ×4 IMPLANT
GOWN STRL REUS W/TWL LRG LVL3 (GOWN DISPOSABLE) ×8
GOWN STRL REUS W/TWL XL LVL3 (GOWN DISPOSABLE) ×8
HEMOSTAT POWDER KIT SURGIFOAM (HEMOSTASIS) ×6 IMPLANT
HEMOSTAT SURGICEL 2X14 (HEMOSTASIS) IMPLANT
HOOK DURA 1/2IN (MISCELLANEOUS) ×2 IMPLANT
KIT BASIN OR (CUSTOM PROCEDURE TRAY) ×4 IMPLANT
KIT TURNOVER KIT B (KITS) ×4 IMPLANT
NEEDLE HYPO 22GX1.5 SAFETY (NEEDLE) ×4 IMPLANT
NS IRRIG 1000ML POUR BTL (IV SOLUTION) ×8 IMPLANT
PACK CRANIOTOMY CUSTOM (CUSTOM PROCEDURE TRAY) ×4 IMPLANT
PATTIES SURGICAL .25X.25 (GAUZE/BANDAGES/DRESSINGS) IMPLANT
PATTIES SURGICAL .5 X.5 (GAUZE/BANDAGES/DRESSINGS) IMPLANT
PATTIES SURGICAL .5 X1 (DISPOSABLE) ×4 IMPLANT
PATTIES SURGICAL .5 X3 (DISPOSABLE) IMPLANT
PATTIES SURGICAL 1/4 X 3 (GAUZE/BANDAGES/DRESSINGS) IMPLANT
PATTIES SURGICAL 1X1 (DISPOSABLE) ×2 IMPLANT
PATTIES SURGICAL 3 X3 (GAUZE/BANDAGES/DRESSINGS)
PATTIES SURGICAL 3X3 (GAUZE/BANDAGES/DRESSINGS) IMPLANT
PIN MAYFIELD SKULL DISP (PIN) IMPLANT
PLATE BONE 12 2H TARGET XL (Plate) ×8 IMPLANT
SCREW UNIII AXS SD 1.5X4 (Screw) ×16 IMPLANT
SPONGE NEURO XRAY DETECT 1X3 (DISPOSABLE) ×6 IMPLANT
SPONGE SURGIFOAM ABS GEL 100 (HEMOSTASIS) ×8 IMPLANT
STAPLER SKIN PROX WIDE 3.9 (STAPLE) ×4 IMPLANT
SUT ETHILON 3 0 FSL (SUTURE) IMPLANT
SUT NURALON 4 0 TR CR/8 (SUTURE) ×10 IMPLANT
SUT VIC AB 2-0 CP2 18 (SUTURE) ×8 IMPLANT
TOWEL GREEN STERILE (TOWEL DISPOSABLE) ×4 IMPLANT
TOWEL GREEN STERILE FF (TOWEL DISPOSABLE) ×4 IMPLANT
TRAY FOLEY MTR SLVR 16FR STAT (SET/KITS/TRAYS/PACK) IMPLANT
UNDERPAD 30X36 HEAVY ABSORB (UNDERPADS AND DIAPERS) IMPLANT
WATER STERILE IRR 1000ML POUR (IV SOLUTION) ×4 IMPLANT

## 2019-12-29 NOTE — Transfer of Care (Signed)
Immediate Anesthesia Transfer of Care Note  Patient: Jeffery Reed  Procedure(s) Performed: Craniotomy for Tumor (Right Head)  Patient Location: PACU  Anesthesia Type:General  Level of Consciousness: drowsy, patient cooperative and responds to stimulation  Airway & Oxygen Therapy: Patient Spontanous Breathing and Patient connected to face mask oxygen  Post-op Assessment: Report given to RN, Post -op Vital signs reviewed and stable and Patient moving all extremities X 4  Post vital signs: Reviewed and stable  Last Vitals:  Vitals Value Taken Time  BP 161/107 12/29/19 2016  Temp    Pulse 83 12/29/19 2019  Resp 19 12/29/19 2019  SpO2 96 % 12/29/19 2019  Vitals shown include unvalidated device data.  Last Pain:  Vitals:   12/29/19 1238  TempSrc: Oral  PainSc:          Complications: No apparent anesthesia complications

## 2019-12-29 NOTE — Anesthesia Procedure Notes (Signed)
Procedure Name: Intubation Date/Time: 12/29/2019 3:57 PM Performed by: Jeneane Pieczynski T, CRNA Pre-anesthesia Checklist: Patient identified, Emergency Drugs available, Suction available and Patient being monitored Patient Re-evaluated:Patient Re-evaluated prior to induction Oxygen Delivery Method: Circle system utilized Preoxygenation: Pre-oxygenation with 100% oxygen Induction Type: IV induction Ventilation: Mask ventilation without difficulty Laryngoscope Size: Glidescope and 3 Grade View: Grade I Tube type: Oral Tube size: 7.5 mm Number of attempts: 1 Airway Equipment and Method: Stylet,  Oral airway and Video-laryngoscopy Placement Confirmation: ETT inserted through vocal cords under direct vision,  positive ETCO2 and breath sounds checked- equal and bilateral Secured at: 22 cm Tube secured with: Tape Dental Injury: Teeth and Oropharynx as per pre-operative assessment  Difficulty Due To: Difficulty was anticipated, Difficult Airway- due to anterior larynx, Difficult Airway- due to large tongue and Difficult Airway- due to limited oral opening Future Recommendations: Recommend- induction with short-acting agent, and alternative techniques readily available

## 2019-12-29 NOTE — Anesthesia Preprocedure Evaluation (Addendum)
Anesthesia Evaluation  Patient identified by MRN, date of birth, ID band Patient awake    Reviewed: Allergy & Precautions, NPO status , Patient's Chart, lab work & pertinent test results  Airway Mallampati: IV  TM Distance: <3 FB Neck ROM: Full    Dental  (+) Teeth Intact, Dental Advisory Given   Pulmonary sleep apnea ,    Pulmonary exam normal        Cardiovascular hypertension, Pt. on medications  Rhythm:Regular Rate:Normal     Neuro/Psych Anxiety negative neurological ROS     GI/Hepatic Neg liver ROS, GERD  ,  Endo/Other  diabetes, Type 2, Oral Hypoglycemic Agents  Renal/GU Renal InsufficiencyRenal disease     Musculoskeletal negative musculoskeletal ROS (+)   Abdominal (+) + obese,   Peds  Hematology negative hematology ROS (+)   Anesthesia Other Findings   Reproductive/Obstetrics                            Anesthesia Physical Anesthesia Plan  ASA: III  Anesthesia Plan: General   Post-op Pain Management:    Induction: Intravenous  PONV Risk Score and Plan: 3 and Ondansetron and Treatment may vary due to age or medical condition  Airway Management Planned: Oral ETT  Additional Equipment: Arterial line  Intra-op Plan:   Post-operative Plan: Extubation in OR  Informed Consent: I have reviewed the patients History and Physical, chart, labs and discussed the procedure including the risks, benefits and alternatives for the proposed anesthesia with the patient or authorized representative who has indicated his/her understanding and acceptance.     Dental advisory given  Plan Discussed with: CRNA  Anesthesia Plan Comments:        Anesthesia Quick Evaluation

## 2019-12-29 NOTE — Progress Notes (Signed)
CBG 291. Report called to ICU and RN will address.

## 2019-12-29 NOTE — Op Note (Signed)
Date of surgery: 12/29/2019 Preoperative diagnosis: Right frontal brain tumor (meningioma) 4 x 7 x 5 cm Postoperative diagnosis: Same Procedure: Right frontal craniotomy and gross total excision of meningioma. Surgeon: Kristeen Miss First Assistant: Deri Fuelling, MD Anesthesia: General endotracheal Indications: Jeffery Reed is a 74 year old individual whose had progressive confusion for several weeks time and was found to have a CT demonstrating a right frontal brain tumor on the scan performed after he had a fall.  An MRI confirmed presence of what is suspected to be a large right frontal meningioma measuring approximately 4 x 7 x 5 cm in size.  He is now taken to the operating room to undergo surgical extirpation of this lesion. Procedure: The patient was brought to the operating room and placed on table in supine position.  After the smooth induction of general endotracheal anesthesia along with placement of an arterial line and Foley catheter his head was shaved then placed in the 3 pin headrest turned slightly to the left with a bump under the right shoulder.  The scalp was prepped with alcohol and DuraPrep and then draped in a sterile fashion after marking a standard right frontal flap across the midline.  The skin was infiltrated with lidocaine with epinephrine and then the incision was made in the scalp flap brought forward.  The area of the keyhole was identified and the temporalis muscle and fascia was brought forward also a singular bur hole was made posteriorly and a bur hole was created in the keyhole.  Then using a craniotome attachment a large right frontal flap was raised along this border the anterior border of the flap did enter into the frontal sinus.  When the flap was raised there was substantial bleeding from the dural attachments and the bone was noted to be remarkably thickened over the region of the frontal fossa.  The hyperemia was felt to be in response to the meningioma.  This  required significant tamponade to obtain control with Gelfoam soaked in thrombin pads that measured approximately 4 x 5 cm.  Once the hemostasis was obtained the area around the dura was identified and cauterized to help control further bleeding tack up sutures were then placed around the perimetry of the dura.  The area of the frontal sinus was inspected carefully and the mucosa was cauterized in the frontal sinus that was apparent.  Then the dura was opened posteriorly and the flap was slowly brought flowered.  Almost immediately tumor was encountered and the tumor itself was quite hemorrhagic.  Dissection went slowly and required significant cauterization tamponade none followed by further dissection further tamponade in a very piecemeal fashion.  The tumor was densely adherent to the dura.  Once the dura was folded forward about two thirds of the way the tumor that was attached to it was dissected away from the undersurface of the dura and carefully cauterized the dural surface pieces of the tumor were removed in this fashion on the medial aspect there was significant hemorrhage towards veins going to the sinus.'s were carefully cauterized divided and also tamponaded gradually in a piecemeal fashion the tumor could be removed all the time maintaining its attachment to the dura superiorly we worked diligently to decompress the frontal lobe although the frontal lobe was very edematous and very hemorrhagic in its attachments to the tumor itself.  The procedure continued until all the tumor was removed and the final fossa was identified.  Some veins in the superficial surface were cauterized here and divided  carefully and tamponade of the brain tissue was obtained.  The undersurface of the dura was cauterized also as the tumor was removed in its entirety the area was then carefully inspected around the perimetry is to make sure that all the residual tumor was removed.  At this point is decided to reflect closed the  dura in several interrupted sutures were placed on the dura to close it.  The brain was relaxed at this point though did fill most of the void from the tumor.  The brain was also noted to be pulsatile in this region.  With this the bone flap at the sinus accentuated on its side and the galeal pericranial flap was raised to fold over the opening into the frontal sinus.  This was then secured to the underlying dura.  This was done with several 4-0 interrupted sutures.  Then the bone flap was replaced and placed into the frontal pole with a singular tack up suture for dog bone plates were used to secure the bone flap back to the cranium.  With this the scalp was reflected and the galea was closed with 2-0 Vicryl in interrupted fashion surgical staples were used on the scalp.  Blood loss for the entire procedure was estimated at 1500 cc.  3 units of packed red cells were given to the patient during this procedure.

## 2019-12-29 NOTE — Progress Notes (Signed)
Pt moving and trying to get out of bed. Continual redirection required. Pt will no follow commands but is moving arms BIL. Labetalol was given and SBP now below 140.

## 2019-12-29 NOTE — Progress Notes (Signed)
Inpatient Diabetes Program Recommendations  AACE/ADA: New Consensus Statement on Inpatient Glycemic Control   Target Ranges:  Prepandial:   less than 140 mg/dL      Peak postprandial:   less than 180 mg/dL (1-2 hours)      Critically ill patients:  140 - 180 mg/dL   Results for JASKIRAT, DEWAR (MRN [id number]) as of 12/29/2019 10:13  Ref. Range 12/28/2019 22:30 12/29/2019 04:51 12/29/2019 08:07  Glucose-Capillary Latest Ref Range: 70 - 99 mg/dL 180 (H) 347 (H) 243 (H)  Results for ASTIN, HANKINSON (MRN [id number]) as of 12/29/2019 10:13  Ref. Range 09/18/2019 11:57 12/29/2019 04:16  Hemoglobin A1C Latest Ref Range: 4.8 - 5.6 % 7.5 (H) 8.1 (H)   Review of Glycemic Control  Diabetes history: DM2 Outpatient Diabetes medications: Glipizide 5 mg BID, Metformin XR 500 mg BID Current orders for Inpatient glycemic control: Novolog 0-15 units Q4H; Decadron 4 Q6H  Inpatient Diabetes Program Recommendations:   Insulin - Basal: If steroids are continued, please consider ordering Lantus 15 units Q24H (based on 102 kg x 0.15 units).  Thanks, Barnie Alderman, RN, MSN, CDE Diabetes Coordinator Inpatient Diabetes Program [phone] (Team Pager from 8am to 5pm)

## 2019-12-29 NOTE — Progress Notes (Signed)
Pt is alert to self only. Pt moves all extremities just not on command. Ok to take Pt to floor.

## 2019-12-29 NOTE — Progress Notes (Addendum)
PROGRESS NOTE  Jeffery Reed [id number] DOB: 04/26/1946 DOA: 12/28/2019 PCP: Unk Pinto, MD  HPI/Recap of past 24 hours: Jeffery Reed is a 74 yo CM with PMH HTN, DMII, CKD, OSA who presented to the ER tonight after a fall at home and confusion. It was unknown if he syncopized and fell vs mechanical fall and hit head; patient unable to recall events.  The last thing he remembers is doing some yard work earlier today. His daughter is present bedside in the ER who helps provide further collateral information. She states that they have noticed a very slow gradual cognitive change since December however over the past 2 weeks he has been more forgetful and has been complaining of a daily headache for the past 1 week requiring taking 1 Tylenol arthritis daily which relieves his headache. There was some consideration of dementia however no formal work-up had been commenced.  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  He has only started over the past couple weeks. He does not have a significant fall history at home but does endorse that his left thigh has been more sore recently for unknown reasons.  He has been evaluated outpatient by Ortho with plans for an MRI which has not taken place as of yet.  He has also been participating in physical therapy with no improvement. Other than his headaches, he denies any vision changes/double vision.  He denies any focal weakness nor any numbness/tingling.  His balance is normal and appropriate.  He did fracture his right foot several years ago (2012) which he says does affect his balance at times.  Due to his altered mental status/fall, he underwent CT head upon arrival to the ER which revealed a large 6 x 2.7 x 4.5 cm partially calcified hyperdense mass in the right frontal lobe with associated mass-effect and 1.1 cm right to left midline shift.   He then underwent MRI brain revealing: "7.0 x 2.9 x 3.1 cm extra  axial mass overlying the right frontal convexity, most consistent with a meningioma. Associated mass effect with vasogenic edema within the underlying right cerebral hemisphere with up to 8 mm of right-to-left shift."  NSG was consulted from the ER to evaluate as well. Patient received 10 mg IV Decadron for vasogenic edema appreciated on brain imaging.  12/29/19: Seen and examined.  No acute issues this morning.  Elevated blood sugars, likely steroids induced, Lantus 5U added.  He is NPO.  Diabetes coordinator also following.    Assessment/Plan: Active Problems:   Frontal mass of brain   Right frontal lobe mass - underwent CTH and MRI brain in ER. MRI reveals 7 x 2.9 x 3.1 cm mass in right frontal lobe with vasogenic edema and R to L midline shift, 8 mm - recent HA likely related to mass; wife mentioned "staring spells" so will need close monitoring for seizure like activity as well; discussed with Dr. Ellene Route;  Currently on Keppra - s/p 10 mg IV decadron in ER; continue on decadron 4 mg q6h - Dr. Ellene Route consulted from ER; plan is for tentative surgery on 5/10 in afternoon - continue q4h neurochecks  HTN - Bp is at goal - use PRN agents for now; patient NPO for now  DMII with hyperglycemia, exacerbated by IV steroids - A1c 8.1 on 12/29/19 - continue on SSI and CBGs Started Lantus 5U daily  CKDIII  - baseline ~1.3-1.5,  - Back To his baseline cr 1.5  with GFR 43 Continue IV F - Monitor UO - BMP in am - avoid nephrotoxic agents as able   OSA - RT eval  BPH Contniue tamsulosin  Code Status:  Full DVT Prophylaxis: SCDs Anticipated disposition is: pending clinical course      Objective: Vitals:   12/28/19 2315 12/29/19 0030 12/29/19 0100 12/29/19 0356  BP: 128/84  121/80 122/76  Pulse: 94 80 69 84  Resp:  17    Temp:    98.4 F (36.9 C)  TempSrc:    Oral  SpO2: 94% 95% 95%   Weight:      Height:       No intake or output data in the 24 hours ending  12/29/19 0743 Filed Weights   12/28/19 1712  Weight: 102.1 kg    Exam:  . General: 74 y.o. year-old male well developed well nourished in no acute distress.  Alert and cooperative. . Cardiovascular: Regular rate and rhythm with no rubs or gallops.    Marland Kitchen Respiratory: Clear to auscultation with no wheezes or rales.  . Abdomen: Soft nontender nondistended with normal bowel sounds. . Musculoskeletal: No lower extremity edema bilaterally . Psychiatry: Mood is appropriate for condition and setting   Data Reviewed: CBC: Recent Labs  Lab 12/28/19 1812 12/29/19 0416  WBC 10.9* 9.1  HGB 13.3 13.7  HCT 38.9* 40.5  MCV 85.9 85.8  PLT 171 [id number]   Basic Metabolic Panel: Recent Labs  Lab 12/28/19 1812 12/29/19 0416  NA 140 139  K 3.8 3.9  CL 105 104  CO2 23 24  GLUCOSE 248* 347*  BUN 21 20  CREATININE 1.70* 1.59*  CALCIUM 9.0 9.2  MG  --  1.9   GFR: Estimated Creatinine Clearance: 50.3 mL/min (A) (by C-G formula based on SCr of 1.59 mg/dL (H)). Liver Function Tests: Recent Labs  Lab 12/28/19 1812  AST 29  ALT 17  ALKPHOS 48  BILITOT 0.9  PROT 6.3*  ALBUMIN 3.7   No results for input(s): LIPASE, AMYLASE in the last 168 hours. No results for input(s): AMMONIA in the last 168 hours. Coagulation Profile: No results for input(s): INR, PROTIME in the last 168 hours. Cardiac Enzymes: No results for input(s): CKTOTAL, CKMB, CKMBINDEX, TROPONINI in the last 168 hours. BNP (last 3 results) No results for input(s): PROBNP in the last 8760 hours. HbA1C: Recent Labs    12/29/19 0416  HGBA1C 8.1*   CBG: Recent Labs  Lab 12/28/19 2230 12/29/19 0451  GLUCAP 180* 347*   Lipid Profile: No results for input(s): CHOL, HDL, LDLCALC, TRIG, CHOLHDL, LDLDIRECT in the last 72 hours. Thyroid Function Tests: No results for input(s): TSH, T4TOTAL, FREET4, T3FREE, THYROIDAB in the last 72 hours. Anemia Panel: No results for input(s): VITAMINB12, FOLATE, FERRITIN, TIBC, IRON,  RETICCTPCT in the last 72 hours. Urine analysis:    Component Value Date/Time   COLORURINE YELLOW 12/28/2019 2142   APPEARANCEUR CLOUDY (A) 12/28/2019 2142   LABSPEC 1.023 12/28/2019 2142   PHURINE 5.0 12/28/2019 2142   GLUCOSEU >=500 (A) 12/28/2019 2142   HGBUR NEGATIVE 12/28/2019 2142   BILIRUBINUR NEGATIVE 12/28/2019 2142   KETONESUR NEGATIVE 12/28/2019 2142   PROTEINUR NEGATIVE 12/28/2019 2142   NITRITE NEGATIVE 12/28/2019 2142   LEUKOCYTESUR NEGATIVE 12/28/2019 2142   Sepsis Labs: @LABRCNTIP (procalcitonin:4,lacticidven:4)  ) Recent Results (from the past 240 hour(s))  Respiratory Panel by RT PCR (Flu A&B, Covid) - Urine, Clean Catch     Status: None   Collection Time: 12/28/19  9:32  PM   Specimen: Urine, Clean Catch  Result Value Ref Range Status   SARS Coronavirus 2 by RT PCR NEGATIVE NEGATIVE Final    Comment: (NOTE) SARS-CoV-2 target nucleic acids are NOT DETECTED. The SARS-CoV-2 RNA is generally detectable in upper respiratoy specimens during the acute phase of infection. The lowest concentration of SARS-CoV-2 viral copies this assay can detect is 131 copies/mL. A negative result does not preclude SARS-Cov-2 infection and should not be used as the sole basis for treatment or other patient management decisions. A negative result may occur with  improper specimen collection/handling, submission of specimen other than nasopharyngeal swab, presence of viral mutation(s) within the areas targeted by this assay, and inadequate number of viral copies (<131 copies/mL). A negative result must be combined with clinical observations, patient history, and epidemiological information. The expected result is Negative. Fact Sheet for Patients:  PinkCheek.be Fact Sheet for Healthcare Providers:  GravelBags.it This test is not yet ap proved or cleared by the [place] FDA and  has been authorized for detection and/or  diagnosis of SARS-CoV-2 by FDA under an Emergency Use Authorization (EUA). This EUA will remain  in effect (meaning this test can be used) for the duration of the COVID-19 declaration under Section 564(b)(1) of the Act, 21 U.S.C. section 360bbb-3(b)(1), unless the authorization is terminated or revoked sooner.    Influenza A by PCR NEGATIVE NEGATIVE Final   Influenza B by PCR NEGATIVE NEGATIVE Final    Comment: (NOTE) The Xpert Xpress SARS-CoV-2/FLU/RSV assay is intended as an aid in  the diagnosis of influenza from Nasopharyngeal swab specimens and  should not be used as a sole basis for treatment. Nasal washings and  aspirates are unacceptable for Xpert Xpress SARS-CoV-2/FLU/RSV  testing. Fact Sheet for Patients: PinkCheek.be Fact Sheet for Healthcare Providers: GravelBags.it This test is not yet approved or cleared by the [place] FDA and  has been authorized for detection and/or diagnosis of SARS-CoV-2 by  FDA under an Emergency Use Authorization (EUA). This EUA will remain  in effect (meaning this test can be used) for the duration of the  Covid-19 declaration under Section 564(b)(1) of the Act, 21  U.S.C. section 360bbb-3(b)(1), unless the authorization is  terminated or revoked. Performed at [facility], [place] [address] [place], [place] [address]       Studies: CT HEAD WO CONTRAST  Result Date: 12/28/2019 CLINICAL DATA:  Found on the floor. EXAM: CT HEAD WITHOUT CONTRAST TECHNIQUE: Contiguous axial images were obtained from the base of the skull through the vertex without intravenous contrast. COMPARISON:  September 23, 2008 FINDINGS: Brain: No evidence of acute infarction, hemorrhage or hydrocephalus. A 6.0 cm x 2.7 cm x 4.5 cm partially calcified heterogeneous, mildly hyperdense mass is seen within the right frontal lobe. This represents a new finding when compared to the prior study. There is associated  mass effect on the adjacent sulci with 1.1 cm right to left midline shift. A moderate amount of adjacent white matter low attenuation is seen within the right frontal lobe Vascular: No hyperdense vessels are identified. Skull: Normal. Negative for fracture or focal lesion. Sinuses/Orbits: No acute finding. Other: None. IMPRESSION: Large, 6.0 cm x 2.7 cm x 4.5 cm partially calcified heterogeneous, mildly hyperdense mass within the right frontal lobe with associated mass effect on the adjacent sulci and 1.1 cm right to left midline shift. This is new when compared to the prior study. While this may represent a large meningioma, MRI correlation is recommended.  Electronically Signed   By: Virgina Norfolk M.D.   On: 12/28/2019 18:09   CT CERVICAL SPINE WO CONTRAST  Result Date: 12/28/2019 CLINICAL DATA:  Found on floor. EXAM: CT CERVICAL SPINE WITHOUT CONTRAST TECHNIQUE: Multidetector CT imaging of the cervical spine was performed without intravenous contrast. Multiplanar CT image reconstructions were also generated. COMPARISON:  None. FINDINGS: Alignment: Normal. Skull base and vertebrae: No acute fracture. No primary bone lesion or focal pathologic process. Soft tissues and spinal canal: No prevertebral fluid or swelling. No visible canal hematoma. Disc levels: Moderate severity endplate sclerosis is seen at the levels of C4-C5, C5-C6 and C6-C7. Mild to moderate severity intervertebral disc space narrowing is also seen at these levels. There is mild bilateral multilevel facet joint hypertrophy. Upper chest: Negative. Other: None. IMPRESSION: 1. No acute osseous abnormality of the cervical spine. 2. Moderate severity multilevel degenerative changes, most prominent at the levels of C4-C5, C5-C6 and C6-C7. Electronically Signed   By: Virgina Norfolk M.D.   On: 12/28/2019 18:13   MR Brain W and Wo Contrast  Result Date: 12/28/2019 CLINICAL DATA:  Initial evaluation for brain mass. EXAM: MRI HEAD WITHOUT AND WITH  CONTRAST TECHNIQUE: Multiplanar, multiecho pulse sequences of the brain and surrounding structures were obtained without and with intravenous contrast. CONTRAST:  10mL GADAVIST GADOBUTROL 1 MMOL/ML IV SOLN COMPARISON:  Prior head CT from earlier the same day. FINDINGS: Brain: Generalized age-related cerebral atrophy. Mild scattered T2/FLAIR hyperintensities involving the periventricular deep white matter both cerebral hemispheres most consistent with chronic small vessel ischemic disease, mild in nature. No evidence for acute or subacute infarct. No encephalomalacia to suggest chronic cortical infarction. No foci of susceptibility artifact to suggest acute or chronic intracranial hemorrhage. Large extra axial mass overlying the anterior right frontal convexity measures approximately 7.0 x 2.9 x 3.1 cm (AP by transverse by craniocaudad), most consistent with a meningioma. Lesion demonstrates fairly homogeneous postcontrast enhancement. Dural thickening and enhancement seen overlying the adjacent right frontal convexity. Anteromedial aspect of the lesion abuts the anterior aspect of the falx and possibly superior sagittal sinus. Associated mass effect with vasogenic edema within the underlying right cerebral hemisphere. Right lateral ventricle partially effaced. Associated right-to-left shift measures up to 8 mm. No hydrocephalus or ventricular trapping at this time. Basilar cisterns remain patent. No other mass lesion or abnormal enhancement. No extra-axial fluid collection. Pituitary gland suprasellar region normal. Vascular: Major intracranial vascular flow voids are maintained. Skull and upper cervical spine: Craniocervical junction within normal limits. Bone marrow signal intensity normal. No scalp soft tissue abnormality. Sinuses/Orbits: Globes and orbital soft tissues within normal limits. Mild mucosal thickening noted within the ethmoidal air cells. Paranasal sinuses are otherwise clear. Small bilateral  mastoid effusions noted, of doubtful significance. Visualized nasopharynx within normal limits. Other: None. IMPRESSION: 1. 7.0 x 2.9 x 3.1 cm extra axial mass overlying the right frontal convexity, most consistent with a meningioma. Associated mass effect with vasogenic edema within the underlying right cerebral hemisphere with up to 8 mm of right-to-left shift. 2. Underlying mild for age chronic microvascular ischemic disease. Electronically Signed   By: Jeannine Boga M.D.   On: 12/28/2019 20:39    Scheduled Meds: . dexamethasone (DECADRON) injection  4 mg Intravenous Q6H  . insulin aspart  0-15 Units Subcutaneous Q4H  . lidocaine (PF)  2 mL Other Once  . tamsulosin  0.4 mg Oral Daily    Continuous Infusions: . sodium chloride 75 mL/hr at 12/29/19 0507  . levETIRAcetam Stopped (12/28/19  2314)     LOS: 1 day     Kayleen Memos, MD [facility] Pager [phone]  If 7PM-7AM, please contact night-coverage [email or web address] Password [facility] 12/29/2019, 7:43 AM

## 2019-12-30 ENCOUNTER — Encounter: Payer: Self-pay | Admitting: *Deleted

## 2019-12-30 LAB — GLUCOSE, CAPILLARY
Glucose-Capillary: 253 mg/dL — ABNORMAL HIGH (ref 70–99)
Glucose-Capillary: 216 mg/dL — ABNORMAL HIGH (ref 70–99)
Glucose-Capillary: 265 mg/dL — ABNORMAL HIGH (ref 70–99)
Glucose-Capillary: 245 mg/dL — ABNORMAL HIGH (ref 70–99)
Glucose-Capillary: 198 mg/dL — ABNORMAL HIGH (ref 70–99)
Glucose-Capillary: 308 mg/dL — ABNORMAL HIGH (ref 70–99)

## 2019-12-30 LAB — CBC
HCT: 35.7 % — ABNORMAL LOW (ref 39.0–52.0)
Hemoglobin: 11.9 g/dL — ABNORMAL LOW (ref 13.0–17.0)
MCH: 29.8 pg (ref 26.0–34.0)
MCHC: 33.3 g/dL (ref 30.0–36.0)
MCV: 89.3 fL (ref 80.0–100.0)
Platelets: 142 10*3/uL — ABNORMAL LOW (ref 150–400)
RBC: 4 MIL/uL — ABNORMAL LOW (ref 4.22–5.81)
RDW: 14.9 % (ref 11.5–15.5)
WBC: 15.4 10*3/uL — ABNORMAL HIGH (ref 4.0–10.5)
nRBC: 0 % (ref 0.0–0.2)

## 2019-12-30 LAB — BASIC METABOLIC PANEL
Anion gap: 11 (ref 5–15)
BUN: 18 mg/dL (ref 8–23)
CO2: 23 mmol/L (ref 22–32)
Calcium: 8.1 mg/dL — ABNORMAL LOW (ref 8.9–10.3)
Chloride: 110 mmol/L (ref 98–111)
Creatinine, Ser: 1.48 mg/dL — ABNORMAL HIGH (ref 0.61–1.24)
GFR calc Af Amer: 54 mL/min — ABNORMAL LOW (ref >60)
GFR calc non Af Amer: 47 mL/min — ABNORMAL LOW (ref >60)
Glucose, Bld: 261 mg/dL — ABNORMAL HIGH (ref 70–99)
Potassium: 4 mmol/L (ref 3.5–5.1)
Sodium: 144 mmol/L (ref 135–145)

## 2019-12-30 LAB — MRSA PCR SCREENING: MRSA by PCR: NEGATIVE

## 2019-12-30 MED ORDER — INSULIN GLARGINE 100 UNIT/ML ~~LOC~~ SOLN
10.0000 [IU] | Freq: Every day | SUBCUTANEOUS | Status: DC
  Administered 2019-12-31: 10 [IU] via SUBCUTANEOUS
  Filled 2019-12-30 (×2): qty 0.1

## 2019-12-30 MED ORDER — INSULIN GLARGINE 100 UNIT/ML ~~LOC~~ SOLN
5.0000 [IU] | Freq: Once | SUBCUTANEOUS | Status: AC
  Administered 2019-12-30: 5 [IU] via SUBCUTANEOUS
  Filled 2019-12-30: qty 0.05

## 2019-12-30 MED FILL — Thrombin For Soln 20000 Unit: CUTANEOUS | Qty: 1 | Status: AC

## 2019-12-30 MED FILL — Thrombin For Soln 5000 Unit: CUTANEOUS | Qty: 5000 | Status: AC

## 2019-12-30 NOTE — Anesthesia Postprocedure Evaluation (Signed)
Anesthesia Post Note  Patient: Jeffery Reed  Procedure(s) Performed: Craniotomy for Tumor (Right Head)     Patient location during evaluation: PACU Anesthesia Type: General Level of consciousness: awake and patient cooperative Pain management: pain level controlled Vital Signs Assessment: post-procedure vital signs reviewed and stable Respiratory status: spontaneous breathing, nonlabored ventilation, respiratory function stable and patient connected to nasal cannula oxygen Cardiovascular status: blood pressure returned to baseline and stable Postop Assessment: no apparent nausea or vomiting Anesthetic complications: no    Last Vitals:  Vitals:   12/30/19 0600 12/30/19 0700  BP: 107/61 109/65  Pulse: (!) 57 61  Resp: (!) 23 20  Temp:    SpO2: 98% 97%    Last Pain:  Vitals:   12/30/19 0400  TempSrc: Axillary  PainSc: 0-No pain                 Taseen Marasigan

## 2019-12-30 NOTE — Progress Notes (Signed)
Removed a white spotted tick from pt's right thigh.

## 2019-12-30 NOTE — Progress Notes (Signed)
Pt unable to be placed on cpap at this time. RT will continue to monitor as needed.

## 2019-12-30 NOTE — Progress Notes (Signed)
PROGRESS NOTE  Jeffery Reed [id number] DOB: 1945/10/09 DOA: 12/28/2019 PCP: Jeffery Pinto, MD  HPI/Recap of past 24 hours: Jeffery Reed is a 74 yo CM with PMH HTN, DMII, CKD, OSA who presented to the ER after a fall at home and confusion. It was unknown if he syncopized and fell vs mechanical fall and hit head; patient unable to recall events.  The last thing he remembers is doing some yard work earlier today. His daughter is present at bedside in the ER who helps provide further collateral information. She states that they have noticed a very slow gradual cognitive change since December.   Over the past 2 weeks he has been more forgetful and has been complaining of a daily headache for the past 1 week requiring taking 1 Tylenol arthritis daily which relieves his headache. There was some consideration of dementia however no formal work-up had been commenced.  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  He has only started over the past couple weeks. He does not have a significant fall history at home but does endorse that his left thigh has been more sore recently for unknown reasons.  He has been evaluated outpatient by Ortho with plans for an MRI which has not taken place as of yet.  He has also been participating in physical therapy with no improvement. Other than his headaches, he denies any vision changes or double vision.  He denies any focal weakness nor any numbness or tingling.  His balance is normal and appropriate.  He did fracture his right foot several years ago (2012) which he says does affect his balance at times.  Due to his altered mental status and fall, he underwent CT head upon arrival to the ER which revealed a large 6 x 2.7 x 4.5 cm partially calcified hyperdense mass in the right frontal lobe with associated mass-effect and 1.1 cm right to left midline shift.   He then underwent MRI brain revealing: "7.0 x 2.9 x 3.1 cm extra  axial mass overlying the right frontal convexity, most consistent with a meningioma. Associated mass effect with vasogenic edema within the underlying right cerebral hemisphere with up to 8 mm of right-to-left shift."  NSG was consulted from the ER to evaluate as well. Patient received 10 mg IV Decadron for vasogenic edema seen on brain imaging.  Post right frontal craniotomy and gross total excision of meningioma on 12/29/19 by Jeffery Reed, neurosurgery.  12/30/19:  Seen and examined at his bedside.  He is alert and following commands.  Vital signs are stable.  O2 saturation 98% on 3 L.  Maintaining MAP greater than 65.      Assessment/Plan: Active Problems:   Frontal mass of brain   Right frontal lobe mass post R frontal craniotomy 12/29/19 by Jeffery Reed - underwent CT head and MRI brain in ER. MRI reveals 7 x 2.9 x 3.1 cm mass in right frontal lobe with vasogenic edema and R to L midline shift, 8 mm - recent HA likely related to mass; wife mentioned "staring spells" so will need close monitoring for seizure like activity as well; discussed with Jeffery Reed;  Post excision as stated above. -Currently on decadron and Keppra -Continue neurochecks  HTN - Bp is soft  Maintain MAP>65  DMII with hyperglycemia, exacerbated by IV steroids - A1c 8.1 on 12/29/19 - continue on SSI and CBGs -Increase dose of Lantus 10U daily  Leukocytosis likely reactive in  the setting of IV steroids use No evidence of active infective process Monitor  CKDIII  - baseline ~1.3-1.5,  - Back To his baseline cr 1.5 with GFR 43 Continue IV F - Monitor UO - BMP in am - avoid nephrotoxic agents as able   OSA - RT eval  BPH Contniue tamsulosin Monitor UO  GERD PPI  Code Status:  Full DVT Prophylaxis: SCDs Anticipated disposition is: pending clinical course      Objective: Vitals:   12/30/19 1200 12/30/19 1300 12/30/19 1400 12/30/19 1500  BP: (!) 105/56 (!) 101/55 (!) 102/55 104/62   Pulse: (!) 57 (!) 54 61 62  Resp: 19 17 20  (!) 25  Temp: 99.3 F (37.4 C)     TempSrc: Axillary     SpO2: 96% 96% 98% 93%  Weight:      Height:        Intake/Output Summary (Last 24 hours) at 12/30/2019 1552 Last data filed at 12/30/2019 1500 Gross per 24 hour  Intake 5623.85 ml  Output 2600 ml  Net 3023.85 ml   Filed Weights   12/28/19 1712  Weight: 102.1 kg    Exam:  . General: 74 y.o. year-old male well-developed well-nourished no acute stress.  Alert and following commands.   . Cardiovascular: Regular rate and rhythm no rubs or gallops. Marland Kitchen Respiratory: Clear to Auscultation with No Wheezes or Rales.  . Abdomen: Soft nontender nondistended normal bowel sounds present.   . Musculoskeletal: No lower extremity edema bilaterally.   Marland Kitchen Psychiatry: Mood is appropriate for condition and setting.  Data Reviewed: CBC: Recent Labs  Lab 12/28/19 1812 12/29/19 0416 12/29/19 1753 12/29/19 1911 12/30/19 0712  WBC 10.9* 9.1  --   --  15.4*  HGB 13.3 13.7 11.2* 10.9* 11.9*  HCT 38.9* 40.5 33.0* 32.0* 35.7*  MCV 85.9 85.8  --   --  89.3  PLT 171 173  --   --  [id number]   Basic Metabolic Panel: Recent Labs  Lab 12/28/19 1812 12/29/19 0416 12/29/19 1753 12/29/19 1911 12/30/19 0712  NA 140 139 141 142 144  K 3.8 3.9 3.8 4.2 4.0  CL 105 104 105  --  110  CO2 23 24  --   --  23  GLUCOSE 248* 347* 243*  --  261*  BUN 21 20 20   --  18  CREATININE 1.70* 1.59* 1.40*  --  1.48*  CALCIUM 9.0 9.2  --   --  8.1*  MG  --  1.9  --   --   --    GFR: Estimated Creatinine Clearance: 54 mL/min (A) (by C-G formula based on SCr of 1.48 mg/dL (H)). Liver Function Tests: Recent Labs  Lab 12/28/19 1812  AST 29  ALT 17  ALKPHOS 48  BILITOT 0.9  PROT 6.3*  ALBUMIN 3.7   No results for input(s): LIPASE, AMYLASE in the last 168 hours. No results for input(s): AMMONIA in the last 168 hours. Coagulation Profile: No results for input(s): INR, PROTIME in the last 168 hours. Cardiac  Enzymes: No results for input(s): CKTOTAL, CKMB, CKMBINDEX, TROPONINI in the last 168 hours. BNP (last 3 results) No results for input(s): PROBNP in the last 8760 hours. HbA1C: Recent Labs    12/29/19 0416  HGBA1C 8.1*   CBG: Recent Labs  Lab 12/29/19 2258 12/30/19 0317 12/30/19 0743 12/30/19 1130 12/30/19 1515  GLUCAP 312* 253* 216* 265* 245*   Lipid Profile: No results for input(s): CHOL, HDL, LDLCALC, TRIG, CHOLHDL, LDLDIRECT  in the last 72 hours. Thyroid Function Tests: No results for input(s): TSH, T4TOTAL, FREET4, T3FREE, THYROIDAB in the last 72 hours. Anemia Panel: No results for input(s): VITAMINB12, FOLATE, FERRITIN, TIBC, IRON, RETICCTPCT in the last 72 hours. Urine analysis:    Component Value Date/Time   COLORURINE YELLOW 12/28/2019 2142   APPEARANCEUR CLOUDY (A) 12/28/2019 2142   LABSPEC 1.023 12/28/2019 2142   PHURINE 5.0 12/28/2019 2142   GLUCOSEU >=500 (A) 12/28/2019 2142   HGBUR NEGATIVE 12/28/2019 2142   BILIRUBINUR NEGATIVE 12/28/2019 2142   KETONESUR NEGATIVE 12/28/2019 2142   PROTEINUR NEGATIVE 12/28/2019 2142   NITRITE NEGATIVE 12/28/2019 2142   LEUKOCYTESUR NEGATIVE 12/28/2019 2142   Sepsis Labs: @LABRCNTIP (procalcitonin:4,lacticidven:4)  ) Recent Results (from the past 240 hour(s))  Respiratory Panel by RT PCR (Flu A&B, Covid) - Urine, Clean Catch     Status: None   Collection Time: 12/28/19  9:32 PM   Specimen: Urine, Clean Catch  Result Value Ref Range Status   SARS Coronavirus 2 by RT PCR NEGATIVE NEGATIVE Final    Comment: (NOTE) SARS-CoV-2 target nucleic acids are NOT DETECTED. The SARS-CoV-2 RNA is generally detectable in upper respiratoy specimens during the acute phase of infection. The lowest concentration of SARS-CoV-2 viral copies this assay can detect is 131 copies/mL. A negative result does not preclude SARS-Cov-2 infection and should not be used as the sole basis for treatment or other patient management decisions. A  negative result may occur with  improper specimen collection/handling, submission of specimen other than nasopharyngeal swab, presence of viral mutation(s) within the areas targeted by this assay, and inadequate number of viral copies (<131 copies/mL). A negative result must be combined with clinical observations, patient history, and epidemiological information. The expected result is Negative. Fact Sheet for Patients:  PinkCheek.be Fact Sheet for Healthcare Providers:  GravelBags.it This test is not yet ap proved or cleared by the [place] FDA and  has been authorized for detection and/or diagnosis of SARS-CoV-2 by FDA under an Emergency Use Authorization (EUA). This EUA will remain  in effect (meaning this test can be used) for the duration of the COVID-19 declaration under Section 564(b)(1) of the Act, 21 U.S.C. section 360bbb-3(b)(1), unless the authorization is terminated or revoked sooner.    Influenza A by PCR NEGATIVE NEGATIVE Final   Influenza B by PCR NEGATIVE NEGATIVE Final    Comment: (NOTE) The Xpert Xpress SARS-CoV-2/FLU/RSV assay is intended as an aid in  the diagnosis of influenza from Nasopharyngeal swab specimens and  should not be used as a sole basis for treatment. Nasal washings and  aspirates are unacceptable for Xpert Xpress SARS-CoV-2/FLU/RSV  testing. Fact Sheet for Patients: PinkCheek.be Fact Sheet for Healthcare Providers: GravelBags.it This test is not yet approved or cleared by the [place] FDA and  has been authorized for detection and/or diagnosis of SARS-CoV-2 by  FDA under an Emergency Use Authorization (EUA). This EUA will remain  in effect (meaning this test can be used) for the duration of the  Covid-19 declaration under Section 564(b)(1) of the Act, 21  U.S.C. section 360bbb-3(b)(1), unless the authorization is   terminated or revoked. Performed at [facility], [address] [place], [place] [address]   MRSA PCR Screening     Status: None   Collection Time: 12/29/19 10:40 PM   Specimen: Nasal Mucosa; Nasopharyngeal  Result Value Ref Range Status   MRSA by PCR NEGATIVE NEGATIVE Final    Comment:  The GeneXpert MRSA Assay (FDA approved for NASAL specimens only), is one component of a comprehensive MRSA colonization surveillance program. It is not intended to diagnose MRSA infection nor to guide or monitor treatment for MRSA infections. Performed at [facility], [place] [address], [place], [place] [address]       Studies: No results found.  Scheduled Meds: . sodium chloride   Intravenous Once  . Chlorhexidine Gluconate Cloth  6 each Topical Daily  . dexamethasone (DECADRON) injection  4 mg Intravenous Q6H  . dexamethasone  4 mg Oral Q6H   Followed by  . [START ON 12/31/2019] dexamethasone  4 mg Oral Q8H   Followed by  . [START ON 01/01/2020] dexamethasone  4 mg Oral Q12H  . docusate sodium  100 mg Oral BID  . insulin aspart  0-15 Units Subcutaneous Q4H  . [START ON 12/31/2019] insulin glargine  10 Units Subcutaneous Daily  . lidocaine (PF)  2 mL Other Once  . pantoprazole (PROTONIX) IV  40 mg Intravenous QHS  . senna  1 tablet Oral BID  . tamsulosin  0.4 mg Oral Daily    Continuous Infusions: . sodium chloride 75 mL/hr at 12/30/19 1500  . sodium chloride Stopped (12/29/19 1951)  .  ceFAZolin (ANCEF) IV Stopped (12/30/19 1337)  . levETIRAcetam Stopped (12/30/19 1043)     LOS: 2 days     Kayleen Memos, MD [facility] Pager [phone]  If 7PM-7AM, please contact night-coverage [email or web address] Password [facility] 12/30/2019, 3:52 PM

## 2019-12-30 NOTE — Progress Notes (Signed)
Patients RR in the mid-high 20's and contacted respiratory for cipap as patient has obstructive sleep apnea.  Unable to put on cipap as patient is disoriented to time, place, and situation.  Will continue to monitor.

## 2019-12-31 ENCOUNTER — Inpatient Hospital Stay (HOSPITAL_COMMUNITY): Payer: Medicare Other

## 2019-12-31 ENCOUNTER — Other Ambulatory Visit: Payer: Self-pay

## 2019-12-31 ENCOUNTER — Ambulatory Visit: Payer: Medicare Other | Admitting: Orthopaedic Surgery

## 2019-12-31 DIAGNOSIS — Z9114 Patient's other noncompliance with medication regimen: Secondary | ICD-10-CM

## 2019-12-31 DIAGNOSIS — G4733 Obstructive sleep apnea (adult) (pediatric): Secondary | ICD-10-CM

## 2019-12-31 DIAGNOSIS — R7303 Prediabetes: Secondary | ICD-10-CM

## 2019-12-31 DIAGNOSIS — N183 Chronic kidney disease, stage 3 unspecified: Secondary | ICD-10-CM

## 2019-12-31 DIAGNOSIS — Z91199 Patient's noncompliance with other medical treatment and regimen due to unspecified reason: Secondary | ICD-10-CM

## 2019-12-31 DIAGNOSIS — R739 Hyperglycemia, unspecified: Secondary | ICD-10-CM

## 2019-12-31 DIAGNOSIS — T380X5A Adverse effect of glucocorticoids and synthetic analogues, initial encounter: Secondary | ICD-10-CM

## 2019-12-31 DIAGNOSIS — N400 Enlarged prostate without lower urinary tract symptoms: Secondary | ICD-10-CM

## 2019-12-31 DIAGNOSIS — G9389 Other specified disorders of brain: Secondary | ICD-10-CM

## 2019-12-31 DIAGNOSIS — R0682 Tachypnea, not elsewhere classified: Secondary | ICD-10-CM

## 2019-12-31 DIAGNOSIS — I10 Essential (primary) hypertension: Secondary | ICD-10-CM

## 2019-12-31 LAB — SURGICAL PATHOLOGY

## 2019-12-31 LAB — TYPE AND SCREEN
ABO/RH(D): O POS
Antibody Screen: NEGATIVE
Unit division: 0
Unit division: 0
Unit division: 0
Unit division: 0

## 2019-12-31 LAB — BPAM RBC
Blood Product Expiration Date: 202106102359
Blood Product Expiration Date: 202106102359
Blood Product Expiration Date: 202106172359
Blood Product Expiration Date: 202106172359
ISSUE DATE / TIME: 202105101743
ISSUE DATE / TIME: 202105101743
ISSUE DATE / TIME: 202105101901
ISSUE DATE / TIME: 202105101901
Unit Type and Rh: 5100
Unit Type and Rh: 5100
Unit Type and Rh: 5100
Unit Type and Rh: 5100

## 2019-12-31 LAB — GLUCOSE, CAPILLARY
Glucose-Capillary: 266 mg/dL — ABNORMAL HIGH (ref 70–99)
Glucose-Capillary: 197 mg/dL — ABNORMAL HIGH (ref 70–99)
Glucose-Capillary: 216 mg/dL — ABNORMAL HIGH (ref 70–99)
Glucose-Capillary: 225 mg/dL — ABNORMAL HIGH (ref 70–99)
Glucose-Capillary: 306 mg/dL — ABNORMAL HIGH (ref 70–99)
Glucose-Capillary: 329 mg/dL — ABNORMAL HIGH (ref 70–99)

## 2019-12-31 LAB — CBC
HCT: 32.3 % — ABNORMAL LOW (ref 39.0–52.0)
Hemoglobin: 10.7 g/dL — ABNORMAL LOW (ref 13.0–17.0)
MCH: 30 pg (ref 26.0–34.0)
MCHC: 33.1 g/dL (ref 30.0–36.0)
MCV: 90.5 fL (ref 80.0–100.0)
Platelets: 113 10*3/uL — ABNORMAL LOW (ref 150–400)
RBC: 3.57 MIL/uL — ABNORMAL LOW (ref 4.22–5.81)
RDW: 15.1 % (ref 11.5–15.5)
WBC: 10.3 10*3/uL (ref 4.0–10.5)
nRBC: 0 % (ref 0.0–0.2)

## 2019-12-31 LAB — BASIC METABOLIC PANEL
Anion gap: 7 (ref 5–15)
BUN: 16 mg/dL (ref 8–23)
CO2: 24 mmol/L (ref 22–32)
Calcium: 8.3 mg/dL — ABNORMAL LOW (ref 8.9–10.3)
Chloride: 110 mmol/L (ref 98–111)
Creatinine, Ser: 1.26 mg/dL — ABNORMAL HIGH (ref 0.61–1.24)
GFR calc Af Amer: >60 mL/min (ref >60)
GFR calc non Af Amer: 57 mL/min — ABNORMAL LOW (ref >60)
Glucose, Bld: 266 mg/dL — ABNORMAL HIGH (ref 70–99)
Potassium: 4.2 mmol/L (ref 3.5–5.1)
Sodium: 141 mmol/L (ref 135–145)

## 2019-12-31 IMAGING — CT CT HEAD W/O CM
3 of 4 series · 14 of 47 positions shown, 16 images · non-contrast
Comparison: Head CT [DATE]

CLINICAL DATA: Meningioma resection

EXAM:
CT HEAD WITHOUT CONTRAST
TECHNIQUE: Contiguous axial images were obtained from the base of the skull
through the vertex without intravenous contrast.

[Series 5: head 3.0 mpr cor · coronal · 0.35mm/px · 3 of 76 slices shown]
[im 26/76  brain]
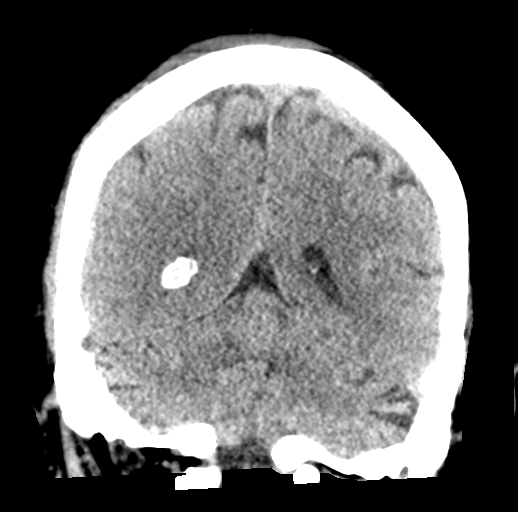
[im 34/76  brain]
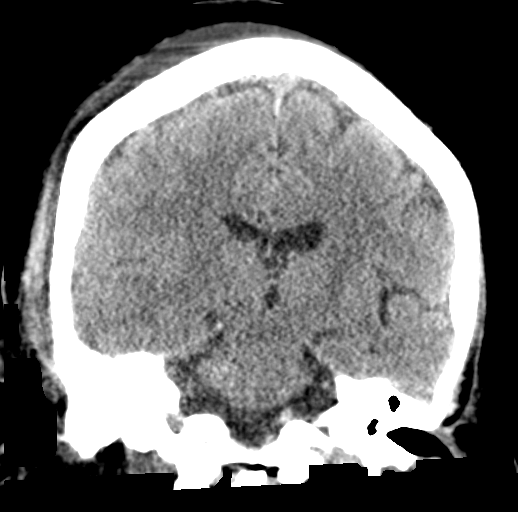
[im 42/76  brain]
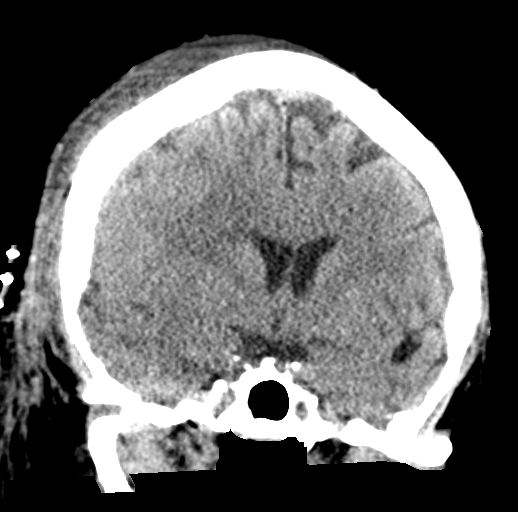

[Series 6: head 3.0 mpr sag · sagittal · 0.35mm/px · 3 of 67 slices shown]
[im 23/67  brain]
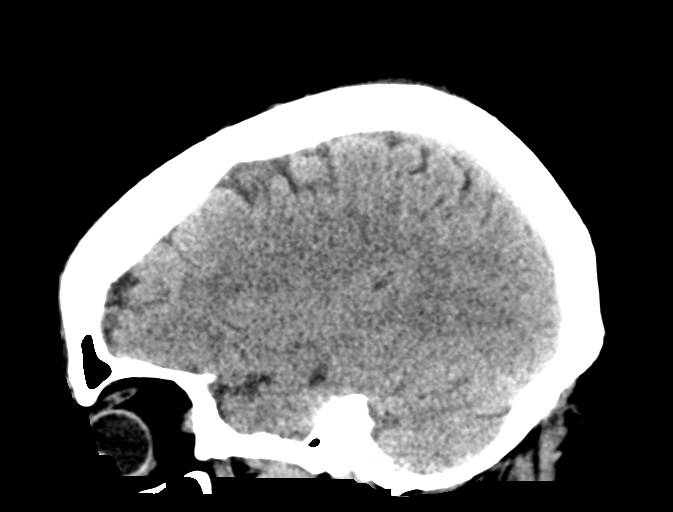
[im 34/67  brain]
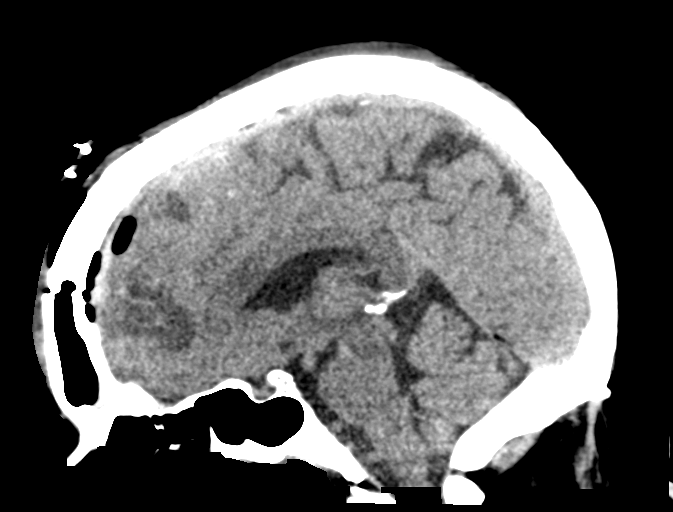
[im 45/67  brain]
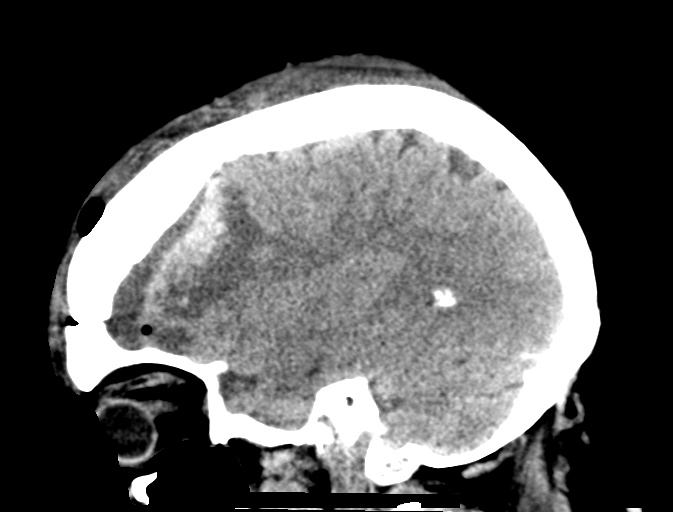

[Series 7: head 3.0 mpr ax · axial · 0.36mm/px · z∈[+1178,+1316]mm · 8 of 61 slices shown, 10 images]
[im 7/61  brain]
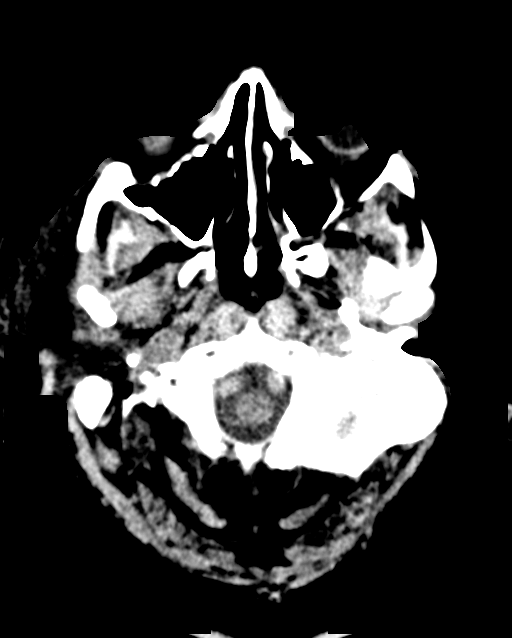
[im 7/61  bone]
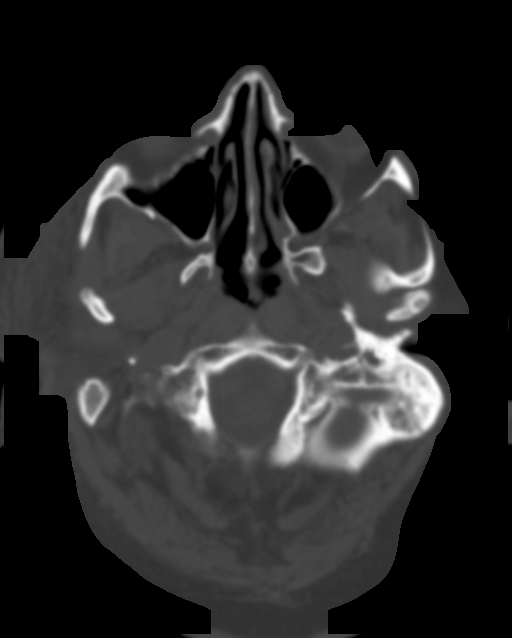
[im 14/61  brain]
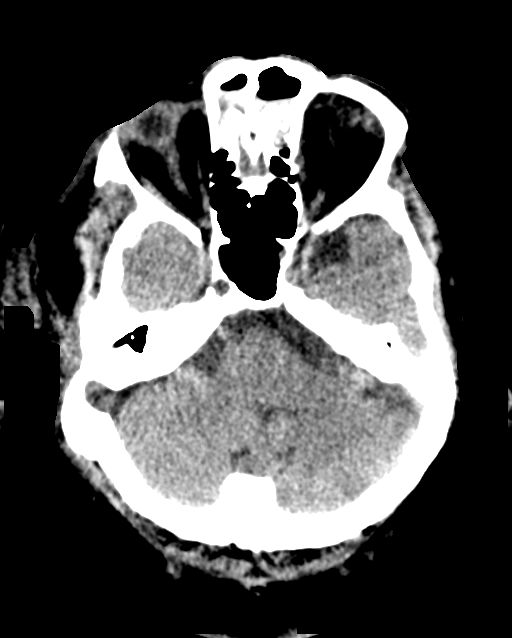
[im 21/61  brain]
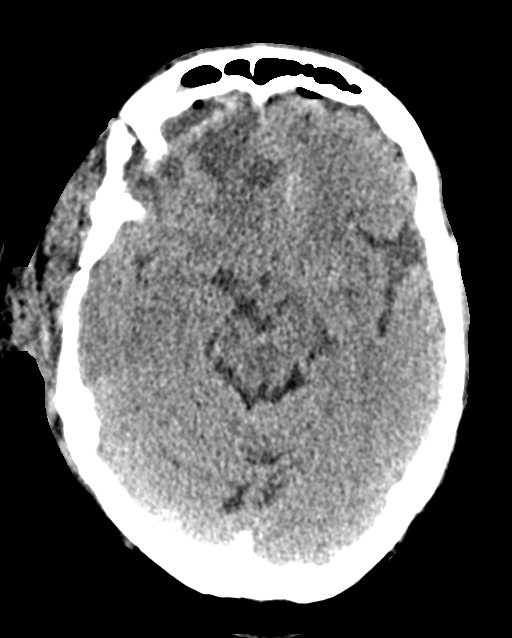
[im 27/61  brain]
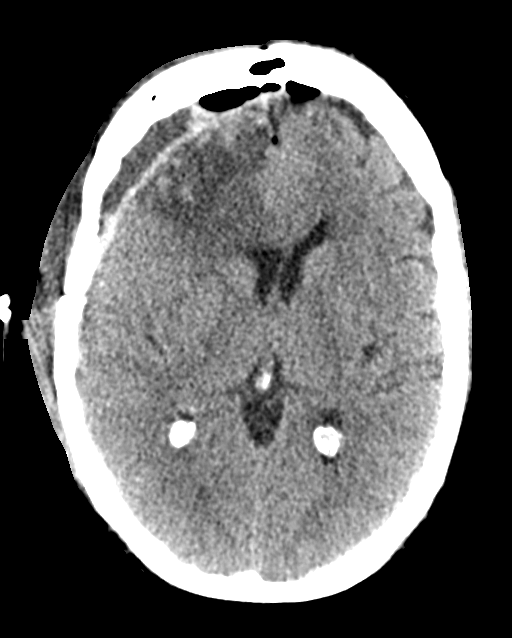
[im 34/61  brain]
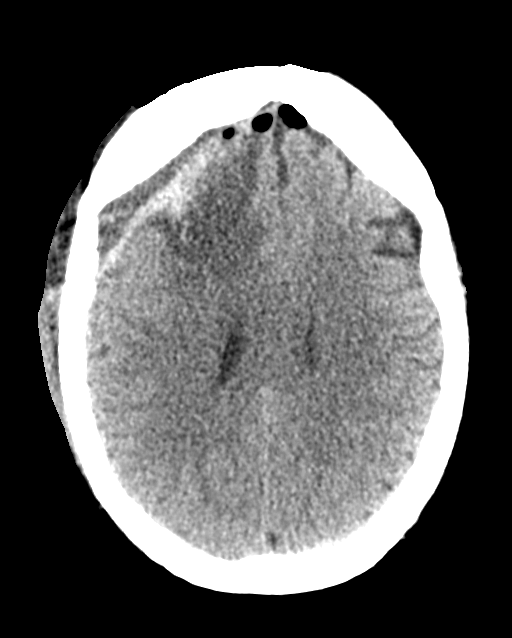
[im 34/61  bone]
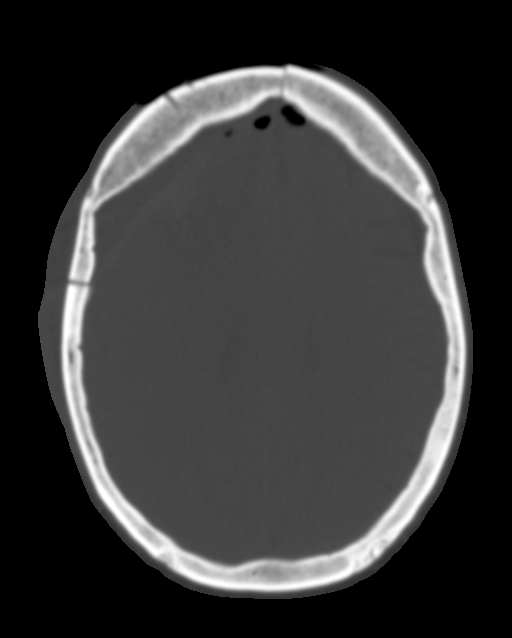
[im 41/61  brain]
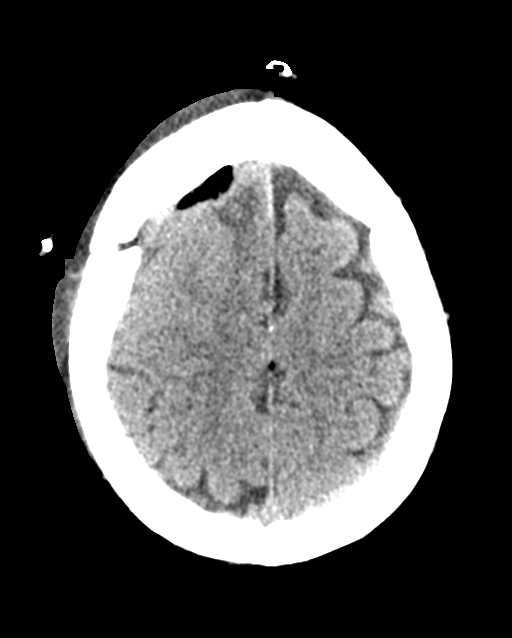
[im 47/61  brain]
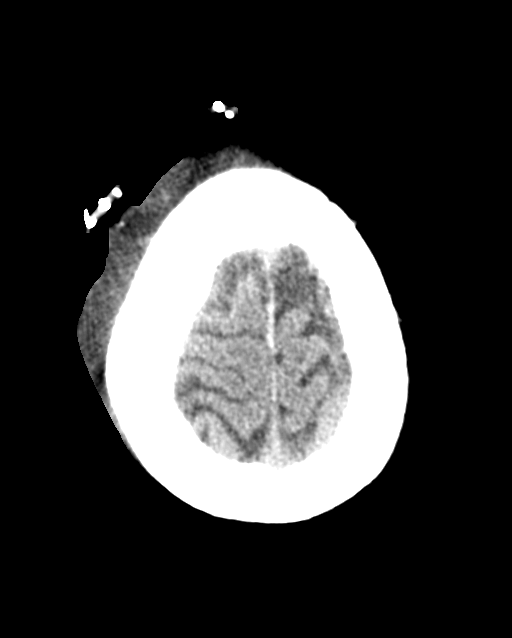
[im 54/61  brain]
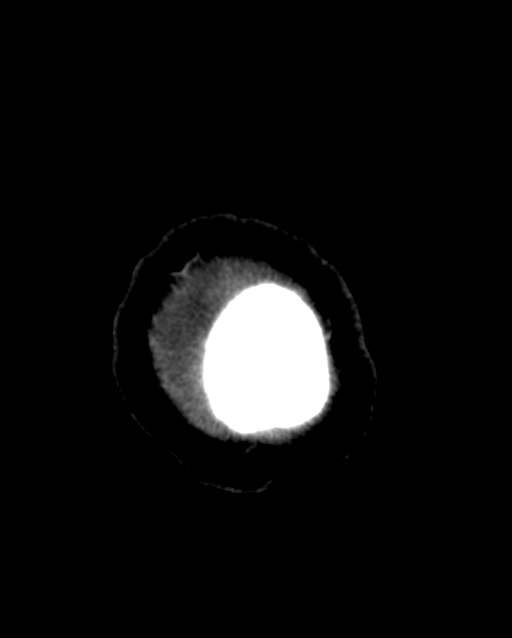

[14 of 47 positions shown; findings below may reference images not displayed]

FINDINGS: Brain: Status post resection of extra-axial tumor along the right
frontal convexity. There is extra-axial and intraparenchymal blood
at the resection site. Intermediate area of hypoattenuation within
the anterior right frontal lobe. Extra-axial collection over the
anterior right convexity measures 8 mm. There is unchanged leftward
midline shift of 5 mm.

Vascular: No hyperdense vessel or unexpected calcification.

Skull: Right frontoparietal craniotomy

Sinuses/Orbits: Fluid in the frontal and anterior ethmoid sinuses.

Other: None.
IMPRESSION: 1. Status post resection of extra-axial tumor along the right
frontal convexity.
2. Extra-axial and intraparenchymal blood at the resection site with
unchanged leftward midline shift of 5 mm.

## 2019-12-31 MED ORDER — INSULIN ASPART 100 UNIT/ML ~~LOC~~ SOLN
0.0000 [IU] | Freq: Every day | SUBCUTANEOUS | Status: DC
  Administered 2019-12-31: 22:00:00 4 [IU] via SUBCUTANEOUS

## 2019-12-31 MED ORDER — INSULIN ASPART 100 UNIT/ML ~~LOC~~ SOLN
6.0000 [IU] | Freq: Three times a day (TID) | SUBCUTANEOUS | Status: DC
  Administered 2019-12-31: 16:00:00 6 [IU] via SUBCUTANEOUS

## 2019-12-31 MED ORDER — LEVETIRACETAM 500 MG PO TABS
500.0000 mg | ORAL_TABLET | Freq: Two times a day (BID) | ORAL | Status: DC
  Administered 2019-12-31 – 2020-01-02 (×4): 500 mg via ORAL
  Filled 2019-12-31 (×4): qty 1

## 2019-12-31 MED ORDER — INSULIN ASPART 100 UNIT/ML ~~LOC~~ SOLN
0.0000 [IU] | Freq: Three times a day (TID) | SUBCUTANEOUS | Status: DC
  Administered 2019-12-31: 16:00:00 3 [IU] via SUBCUTANEOUS

## 2019-12-31 MED ORDER — PANTOPRAZOLE SODIUM 40 MG PO TBEC
40.0000 mg | DELAYED_RELEASE_TABLET | Freq: Every day | ORAL | Status: DC
  Administered 2019-12-31 – 2020-01-02 (×3): 40 mg via ORAL
  Filled 2019-12-31 (×3): qty 1

## 2019-12-31 NOTE — Progress Notes (Signed)
PROGRESS NOTE  Jeffery Reed [id number] DOB: [birth year] DOA: 12/28/2019 PCP: Unk Pinto, MD  HPI/Recap of past 24 hours: Jeffery Reed is a 74 yo CM with PMH HTN, DMII, CKD, OSA who presented to the ER after a fall at home and confusion. It was unknown if he syncopized and fell vs mechanical fall and hit head; patient unable to recall events.  The last thing he remembers is doing some yard work earlier today. His daughter is present at bedside in the ER who helps provide further collateral information. She states that they have noticed a very slow gradual cognitive change since December.   Over the past 2 weeks he has been more forgetful and has been complaining of a daily headache for the past 1 week requiring taking 1 Tylenol arthritis daily which relieves his headache. There was some consideration of dementia however no formal work-up had been commenced.  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  He has only started over the past couple weeks. He does not have a significant fall history at home but does endorse that his left thigh has been more sore recently for unknown reasons.  He has been evaluated outpatient by Ortho with plans for an MRI which has not taken place as of yet.  He has also been participating in physical therapy with no improvement. Other than his headaches, he denies any vision changes or double vision.  He denies any focal weakness nor any numbness or tingling.  His balance is normal and appropriate.  He did fracture his right foot several years ago (2012) which he says does affect his balance at times.  Due to his altered mental status and fall, he underwent CT head upon arrival to the ER which revealed a large 6 x 2.7 x 4.5 cm partially calcified hyperdense mass in the right frontal lobe with associated mass-effect and 1.1 cm right to left midline shift.   He then underwent MRI brain revealing: "7.0 x 2.9 x 3.1 cm extra  axial mass overlying the right frontal convexity, most consistent with a meningioma. Associated mass effect with vasogenic edema within the underlying right cerebral hemisphere with up to 8 mm of right-to-left shift."  NSG was consulted from the ER to evaluate as well. Patient received 10 mg IV Decadron for vasogenic edema seen on brain imaging.  Post right frontal craniotomy and gross total excision of meningioma on 12/29/19 by Jeffery Reed, neurosurgery.  12/31/19:  Seen and examined. A&O x 3. No headaches. VS stable.  Ambulated this AM with therapy.      Assessment/Plan: Active Problems:   Frontal mass of brain   Right frontal lobe mass post R frontal craniotomy 12/29/19 by Jeffery Reed - underwent CT head and MRI brain in ER. MRI reveals 7 x 2.9 x 3.1 cm mass in right frontal lobe with vasogenic edema and R to L midline shift, 8 mm - recent HA likely related to mass; wife mentioned "staring spells" so will need close monitoring for seizure like activity as well; discussed with Jeffery Reed;  Post excision as stated above. -Currently on po decadron and Keppra -On IV cefazolin prophylactically perisurgical  Newly diagnosed brain Meningioma As evidenced by pathology result Management per neurosurgery  HTN - Bp is at goal   DMII with hyperglycemia, exacerbated by IV steroids - A1c 8.1 on 12/29/19 - continue on SSI and CBGs -continue Lantus 10U daily Add novolog 6 U TID  Resolved Leukocytosis likely reactive in the setting of IV steroids use No evidence of active infective process Monitor  CKDIII A - baseline  1.2 with GFR 57 - Back To his baseline Continue IV F, gentle decrease rate to 50cc/hr - Monitor UO - BMP in am - avoid nephrotoxic agents as able   OSA - RT eval  BPH Contniue tamsulosin Monitor UO  GERD PPI  Code Status:  Full DVT Prophylaxis: SCDs Anticipated disposition is: pending clinical course      Objective: Vitals:   12/31/19 0800  12/31/19 0900 12/31/19 1000 12/31/19 1200  BP: (!) 106/47 (!) 105/54 125/73 134/69  Pulse: (!) 58 67 63 81  Resp: 18 19 (!) 21 (!) 27  Temp: 99.3 F (37.4 C)   99.2 F (37.3 C)  TempSrc: Oral   Oral  SpO2: 93% 98% 97% 96%  Weight:      Height:        Intake/Output Summary (Last 24 hours) at 12/31/2019 1306 Last data filed at 12/31/2019 1200 Gross per 24 hour  Intake 1841.83 ml  Output 320 ml  Net 1521.83 ml   Filed Weights   12/28/19 1712  Weight: 102.1 kg    Exam: continues to improve  . General: 74 y.o. year-old male well-developed well-nourished in no acute distress.  Alert and oriented x3.   . Cardiovascular: Regular rate and rhythm no rubs or gallops.   Marland Kitchen Respiratory: Clear to auscultation no wheezes or rales.   . Abdomen: Soft nontender normal bowel sounds present. . Musculoskeletal: No lower extremity edema bilaterally.   Marland Kitchen Psychiatry: Mood is appropriate for condition and setting.   Data Reviewed: CBC: Recent Labs  Lab 12/28/19 1812 12/28/19 1812 12/29/19 0416 12/29/19 1753 12/29/19 1911 12/30/19 0712 12/31/19 0444  WBC 10.9*  --  9.1  --   --  15.4* 10.3  HGB 13.3   < > 13.7 11.2* 10.9* 11.9* 10.7*  HCT 38.9*   < > 40.5 33.0* 32.0* 35.7* 32.3*  MCV 85.9  --  85.8  --   --  89.3 90.5  PLT 171  --  173  --   --  142* 113*   < > = values in this interval not displayed.   Basic Metabolic Panel: Recent Labs  Lab 12/28/19 1812 12/28/19 1812 12/29/19 0416 12/29/19 1753 12/29/19 1911 12/30/19 0712 12/31/19 0444  NA 140   < > 139 141 142 144 141  K 3.8   < > 3.9 3.8 4.2 4.0 4.2  CL 105  --  104 105  --  110 110  CO2 23  --  24  --   --  23 24  GLUCOSE 248*  --  347* 243*  --  261* 266*  BUN 21  --  20 20  --  18 16  CREATININE 1.70*  --  1.59* 1.40*  --  1.48* 1.26*  CALCIUM 9.0  --  9.2  --   --  8.1* 8.3*  MG  --   --  1.9  --   --   --   --    < > = values in this interval not displayed.   GFR: Estimated Creatinine Clearance: 63.4 mL/min (A)  (by C-G formula based on SCr of 1.26 mg/dL (H)). Liver Function Tests: Recent Labs  Lab 12/28/19 1812  AST 29  ALT 17  ALKPHOS 48  BILITOT 0.9  PROT 6.3*  ALBUMIN 3.7   No results for input(s): LIPASE, AMYLASE  in the last 168 hours. No results for input(s): AMMONIA in the last 168 hours. Coagulation Profile: No results for input(s): INR, PROTIME in the last 168 hours. Cardiac Enzymes: No results for input(s): CKTOTAL, CKMB, CKMBINDEX, TROPONINI in the last 168 hours. BNP (last 3 results) No results for input(s): PROBNP in the last 8760 hours. HbA1C: Recent Labs    12/29/19 0416  HGBA1C 8.1*   CBG: Recent Labs  Lab 12/30/19 1924 12/30/19 2313 12/31/19 0320 12/31/19 0830 12/31/19 1207  GLUCAP 198* 308* 266* 197* 216*   Lipid Profile: No results for input(s): CHOL, HDL, LDLCALC, TRIG, CHOLHDL, LDLDIRECT in the last 72 hours. Thyroid Function Tests: No results for input(s): TSH, T4TOTAL, FREET4, T3FREE, THYROIDAB in the last 72 hours. Anemia Panel: No results for input(s): VITAMINB12, FOLATE, FERRITIN, TIBC, IRON, RETICCTPCT in the last 72 hours. Urine analysis:    Component Value Date/Time   COLORURINE YELLOW 12/28/2019 2142   APPEARANCEUR CLOUDY (A) 12/28/2019 2142   LABSPEC 1.023 12/28/2019 2142   PHURINE 5.0 12/28/2019 2142   GLUCOSEU >=500 (A) 12/28/2019 2142   HGBUR NEGATIVE 12/28/2019 2142   BILIRUBINUR NEGATIVE 12/28/2019 2142   KETONESUR NEGATIVE 12/28/2019 2142   PROTEINUR NEGATIVE 12/28/2019 2142   NITRITE NEGATIVE 12/28/2019 2142   LEUKOCYTESUR NEGATIVE 12/28/2019 2142   Sepsis Labs: @LABRCNTIP (procalcitonin:4,lacticidven:4)  ) Recent Results (from the past 240 hour(s))  Respiratory Panel by RT PCR (Flu A&B, Covid) - Urine, Clean Catch     Status: None   Collection Time: 12/28/19  9:32 PM   Specimen: Urine, Clean Catch  Result Value Ref Range Status   SARS Coronavirus 2 by RT PCR NEGATIVE NEGATIVE Final    Comment: (NOTE) SARS-CoV-2 target  nucleic acids are NOT DETECTED. The SARS-CoV-2 RNA is generally detectable in upper respiratoy specimens during the acute phase of infection. The lowest concentration of SARS-CoV-2 viral copies this assay can detect is 131 copies/mL. A negative result does not preclude SARS-Cov-2 infection and should not be used as the sole basis for treatment or other patient management decisions. A negative result may occur with  improper specimen collection/handling, submission of specimen other than nasopharyngeal swab, presence of viral mutation(s) within the areas targeted by this assay, and inadequate number of viral copies (<131 copies/mL). A negative result must be combined with clinical observations, patient history, and epidemiological information. The expected result is Negative. Fact Sheet for Patients:  PinkCheek.be Fact Sheet for Healthcare Providers:  GravelBags.it This test is not yet ap proved or cleared by the [place] FDA and  has been authorized for detection and/or diagnosis of SARS-CoV-2 by FDA under an Emergency Use Authorization (EUA). This EUA will remain  in effect (meaning this test can be used) for the duration of the COVID-19 declaration under Section 564(b)(1) of the Act, 21 U.S.C. section 360bbb-3(b)(1), unless the authorization is terminated or revoked sooner.    Influenza A by PCR NEGATIVE NEGATIVE Final   Influenza B by PCR NEGATIVE NEGATIVE Final    Comment: (NOTE) The Xpert Xpress SARS-CoV-2/FLU/RSV assay is intended as an aid in  the diagnosis of influenza from Nasopharyngeal swab specimens and  should not be used as a sole basis for treatment. Nasal washings and  aspirates are unacceptable for Xpert Xpress SARS-CoV-2/FLU/RSV  testing. Fact Sheet for Patients: PinkCheek.be Fact Sheet for Healthcare Providers: GravelBags.it This test is not  yet approved or cleared by the [place] FDA and  has been authorized for detection and/or diagnosis of SARS-CoV-2 by  FDA under  an Emergency Use Authorization (EUA). This EUA will remain  in effect (meaning this test can be used) for the duration of the  Covid-19 declaration under Section 564(b)(1) of the Act, 21  U.S.C. section 360bbb-3(b)(1), unless the authorization is  terminated or revoked. Performed at [facility], [place] [address], [place], [place] [address]   MRSA PCR Screening     Status: None   Collection Time: 12/29/19 10:40 PM   Specimen: Nasal Mucosa; Nasopharyngeal  Result Value Ref Range Status   MRSA by PCR NEGATIVE NEGATIVE Final    Comment:        The GeneXpert MRSA Assay (FDA approved for NASAL specimens only), is one component of a comprehensive MRSA colonization surveillance program. It is not intended to diagnose MRSA infection nor to guide or monitor treatment for MRSA infections. Performed at [facility], [place] [address] [place], [place] [address]       Studies: CT HEAD WO CONTRAST  Result Date: 12/31/2019 CLINICAL DATA:  Meningioma resection EXAM: CT HEAD WITHOUT CONTRAST TECHNIQUE: Contiguous axial images were obtained from the base of the skull through the vertex without intravenous contrast. COMPARISON:  Head CT 12/28/2019 FINDINGS: Brain: Status post resection of extra-axial tumor along the right frontal convexity. There is extra-axial and intraparenchymal blood at the resection site. Intermediate area of hypoattenuation within the anterior right frontal lobe. Extra-axial collection over the anterior right convexity measures 8 mm. There is unchanged leftward midline shift of 5 mm. Vascular: No hyperdense vessel or unexpected calcification. Skull: Right frontoparietal craniotomy Sinuses/Orbits: Fluid in the frontal and anterior ethmoid sinuses. Other: None. IMPRESSION: 1. Status post resection of extra-axial tumor along the right frontal  convexity. 2. Extra-axial and intraparenchymal blood at the resection site with unchanged leftward midline shift of 5 mm. Electronically Signed   By: Ulyses Jarred M.D.   On: 12/31/2019 02:51    Scheduled Meds: . sodium chloride   Intravenous Once  . Chlorhexidine Gluconate Cloth  6 each Topical Daily  . dexamethasone (DECADRON) injection  4 mg Intravenous Q6H  . dexamethasone  4 mg Oral Q8H   Followed by  . [START ON 01/01/2020] dexamethasone  4 mg Oral Q12H  . docusate sodium  100 mg Oral BID  . insulin aspart  0-15 Units Subcutaneous Q4H  . insulin glargine  10 Units Subcutaneous Daily  . levETIRAcetam  500 mg Oral BID  . lidocaine (PF)  2 mL Other Once  . pantoprazole  40 mg Oral Daily  . senna  1 tablet Oral BID  . tamsulosin  0.4 mg Oral Daily    Continuous Infusions: . sodium chloride 75 mL/hr at 12/31/19 1200  . sodium chloride Stopped (12/29/19 1951)  .  ceFAZolin (ANCEF) IV Stopped (12/31/19 0559)     LOS: 3 days     Kayleen Memos, MD [facility] Pager [phone]  If 7PM-7AM, please contact night-coverage [email or web address] Password [facility] 12/31/2019, 1:06 PM

## 2019-12-31 NOTE — Progress Notes (Signed)
Inpatient Rehabilitation-Admissions Coordinator   Followed up with pt after PM&R consult. Please see PM&R consult done by Dr. Delice Lesch on 12/31/19 for details. Reviewed PM&R recommendations and PT/OT recommendations for Outpatient Therapy. Pt states he has been doing outpatient therapy for over a month now, going 2x/week. We discussed that that is what is recommended currently to address his higher level functional deficits. Pt would like to speak with his wife tonight about the recommendation and requested I follow up with him tomorrow to see if they had any questions. AC will follow up tomorrow to answer any lingering questions about recommendations prior to signing off.   Raechel Ache, OTR/L  Rehab Admissions Coordinator  [phone] 12/31/2019 4:04 PM

## 2019-12-31 NOTE — Evaluation (Signed)
Occupational Therapy Evaluation Patient Details Name: Jeffery Reed MRN: [id number] DOB: [birth year] Today's Date: 12/31/2019    History of Present Illness 74 yo male s/p fall at home with confusion. CT revealed  6 x 2.7 x 4.5 cm partially calcified hyperdense mass in the right frontal lobe with associated mass-effect and 1.1 cm right to left midline shift. MRI (+)mass overlying the right frontalconvexity, most consistent with a meningioma. pt R frontal craniotomy with gross total excision of meningioma 5/10 . 5/11 removal tick form R thigh PMH: HTN DM2, CKD, OSA R foot fx 2012   Clinical Impression   Patient is s/p R frontal craniotomy surgery resulting in functional limitations due to the deficits listed below (see OT problem list). Pt currently requires min (A) for transfers. OT next session to challenge balance with bathroom transfers and LB dressing.  Patient will benefit from skilled OT acutely to increase independence and safety with ADLS to allow discharge outpatient.     Follow Up Recommendations  Outpatient OT    Equipment Recommendations  None recommended by OT    Recommendations for Other Services       Precautions / Restrictions Precautions Precautions: Fall Precaution Comments: hx of fall (2 in 6 months) Restrictions Weight Bearing Restrictions: No      Mobility Bed Mobility Overal bed mobility: Needs Assistance Bed Mobility: Supine to Sit     Supine to sit: Supervision;HOB elevated     General bed mobility comments: pulled up on railing  Transfers Overall transfer level: Needs assistance Equipment used: None;1 person hand held assist Transfers: Sit to/from Stand Sit to Stand: Min assist         General transfer comment: for balance, safety, lines    Balance Overall balance assessment: Needs assistance Sitting-balance support: No upper extremity supported;Feet supported Sitting balance-Leahy Scale: Good     Standing balance support: No upper  extremity supported Standing balance-Leahy Scale: Fair Standing balance comment: initially standing with minguard A, then able to stand with S after up for a minute, stood in bathroom to urinate in toilet and washed L hand (R hand with pulse ox)                           ADL either performed or assessed with clinical judgement   ADL Overall ADL's : Needs assistance/impaired Eating/Feeding: Set up   Grooming: Set up Grooming Details (indicate cue type and reason): washing hands at sink min guard (A)                  Toilet Transfer: Minimal assistance   Toileting- Clothing Manipulation and Hygiene: Minimal assistance       Functional mobility during ADLs: Minimal assistance General ADL Comments: pt with lOb with head turns at this time. pt with L lean during ambulation at times     Vision Baseline Vision/History: Wears glasses Wears Glasses: Reading only Vision Assessment?: Vision impaired- to be further tested in functional context     Perception     Praxis      Pertinent Vitals/Pain Pain Assessment: 0-10 Pain Score: 5  Pain Descriptors / Indicators: Headache Pain Intervention(s): Monitored during session;Repositioned;Premedicated before session     Hand Dominance Right   Extremity/Trunk Assessment Upper Extremity Assessment Upper Extremity Assessment: Overall WFL for tasks assessed   Lower Extremity Assessment Lower Extremity Assessment: Defer to PT evaluation LLE Deficits / Details: no noted deficits with testing, but some functional weakness with turns  and direction changes LLE Sensation: WNL   Cervical / Trunk Assessment Cervical / Trunk Assessment: Other exceptions Cervical / Trunk Exceptions: craniotomy with head wrapped in gauze. pt is a picker and touching incision sites   Communication Communication Communication: Expressive difficulties   Cognition Arousal/Alertness: Awake/alert Behavior During Therapy: WFL for tasks  assessed/performed Overall Cognitive Status: Impaired/Different from baseline Area of Impairment: Orientation                 Orientation Level: Time(Jan 09, 2020)             General Comments: pt has self awareness to balance deficits.pt reports when LOB occurs during session   General Comments  close family friend to (A) couple upon d/c. pt agreeable to (A) from extended friends and family    Exercises     Shoulder Instructions      Home Living Family/patient expects to be discharged to:: Private residence Living Arrangements: Spouse/significant other;Other relatives Available Help at Discharge: Family Type of Home: House Home Access: Stairs to enter [employer] of Steps: 3 Entrance Stairs-Rails: Right Home Layout: One level     Bathroom Shower/Tub: Occupational psychologist: Handicapped height     Home Equipment: Grab bars - tub/shower;Walker - 2 wheels;Kasandra Knudsen - single point   Additional Comments: wife Peggy; 67 y/o grandson lives there too works 6-4 daily, friend next door visiting as wife cannot visit and she can help till 32 when 74 y/o gets off the bus      Prior Functioning/Environment Level of Independence: Independent        Comments: driving school bus         OT Problem List: Decreased strength;Decreased activity tolerance;Impaired balance (sitting and/or standing);Impaired vision/perception;Decreased safety awareness;Decreased knowledge of use of DME or AE;Decreased knowledge of precautions;Pain      OT Treatment/Interventions: Self-care/ADL training;Therapeutic exercise;Neuromuscular education;Energy conservation;DME and/or AE instruction;Manual therapy;Therapeutic activities;Patient/family education;Balance training    OT Goals(Current goals can be found in the care plan section) Acute Rehab OT Goals Patient Stated Goal: to go home OT Goal Formulation: With patient Time For Goal Achievement: 01/14/20 Potential to Achieve  Goals: Good  OT Frequency: Min 2X/week   Barriers to D/C:            Co-evaluation PT/OT/SLP Co-Evaluation/Treatment: Yes Reason for Co-Treatment: For patient/therapist safety;To address functional/ADL transfers PT goals addressed during session: Mobility/safety with mobility;Balance;Strengthening/ROM OT goals addressed during session: ADL's and self-care;Strengthening/ROM      AM-PAC OT "6 Clicks" Daily Activity     Outcome Measure Help from another person eating meals?: A Little Help from another person taking care of personal grooming?: A Little Help from another person toileting, which includes using toliet, bedpan, or urinal?: A Little Help from another person bathing (including washing, rinsing, drying)?: A Little Help from another person to put on and taking off regular upper body clothing?: A Little Help from another person to put on and taking off regular lower body clothing?: A Little 6 Click Score: 18   End of Session Equipment Utilized During Treatment: Gait belt Nurse Communication: Mobility status;Precautions  Activity Tolerance: Patient tolerated treatment well Patient left: in chair;with call bell/phone within reach;with chair alarm set  OT Visit Diagnosis: Unsteadiness on feet (R26.81);Muscle weakness (generalized) (M62.81)                Time:  -    Charges:      Fleeta Emmer, OTR/L  Acute Rehabilitation Services Pager: [phone] Office: [phone] .  Jeri Modena 12/31/2019, 1:30 PM

## 2019-12-31 NOTE — Consult Note (Signed)
Physical Medicine and Rehabilitation Consult   Reason for Consult: Functional decline due to frontal tumor.  Referring Physician: Dr. Ellene Route.   HPI: Jeffery Reed is a 74 y.o. male with history of HTN, OSA--non compliant with CPAP use, CKD III, pre-diabetes, left IT/trochanteric hip--PT per Dr. Ninfa Linden;  who was admitted on 12/28/2019 after a fall with confusion.  History taken from chart review and patient.  Family reported gradual cognitive decline since December as well as staring spells but worse in 2 weeks PTA with reports of daily HA and left thigh pain.  Patient states he was going to outpatient therapies in [facility] for his leg weakness.  MRI brain done revealing 7.0 X 2.9 X 3.1 cm extra axial mass overlying right frontal convexity c/w meningioma, associated mass effect and vasogenic edema with 8 mm right to left shift.  X ray of hip unremarkable for fracture.  Repeat head CT on 12/31/2019 showing postsurgical changes with midline shift.  Renal ultrasound done due to CKD and showed 2.7 cm right midpole hypoechoic lesion c/w cysts and enlarged prostate with mass effect on bladder.  He was started on steroids for vasogenic edema and underwent right frontal crani for gross total excision of meningioma by Dr. Ellene Route. Pathology positive for meningioma. Therapy evaluations completed this am revealing balance deficits with left lean. CIR recommended due to functional deficits.   Review of Systems  HENT: Positive for hearing loss (decrease in left eye).   Eyes: Negative for blurred vision and double vision.  Respiratory: Negative for cough.   Cardiovascular: Negative for chest pain and palpitations.  Gastrointestinal: Positive for heartburn. Negative for abdominal pain and vomiting.  Genitourinary: Positive for frequency.       Nocturia X 3, occasionally dribbles.   Musculoskeletal: Positive for back pain and myalgias.       Problems waking since foot fracture--tends to be off balance.    Neurological: Positive for dizziness (occasional with standing activity), speech change and weakness. Negative for headaches.  All other systems reviewed and are negative.    Past Medical History:  Diagnosis Date  . Allergy   . Anxiety   . Cataract    beginning stage 01/22/2019  . Chronic kidney disease    enlarge bladder  . Diabetes mellitus without complication ([place])   . GERD (gastroesophageal reflux disease)   . Gout   . Hyperlipidemia   . Hypertension   . OSA (obstructive sleep apnea)   . Pre-diabetes   . Sleep apnea    c pap    Past Surgical History:  Procedure Laterality Date  . COLONOSCOPY    . CRANIOTOMY Right 12/29/2019   Procedure: Craniotomy for Tumor;  Surgeon: Kristeen Miss, MD;  Location: [place];  Service: Neurosurgery;  Laterality: Right;  Craniotomy for Tumor Excision  . FOOT SURGERY  10/2009  . MYRINGOTOMY Left 02-2005   Dr.Newman  . TONSILLECTOMY  1953    Family History  Problem Relation Age of Onset  . Hypertension Mother   . GER disease Mother   . Hyperlipidemia Father   . Hypertension Father   . Parkinson's disease Father   . Hypertension Brother   . Diabetes Maternal Grandmother   . Stroke Paternal Grandmother   . Diabetes Paternal Grandmother   . Stroke Paternal Grandfather   . Colon cancer Neg Hx   . Colon polyps Neg Hx   . Esophageal cancer Neg Hx   . Stomach cancer Neg Hx   . Rectal  cancer Neg Hx     Social History:   Married. Wife with back problems but has a close friend who's like a daughter.   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 He has been going to outpatient therapy for left hip pain? He reports that he has never smoked. He has never used smokeless tobacco. He reports that he does not drink alcohol or use drugs.    Allergies  Allergen Reactions  . Cardura [Doxazosin Mesylate] Other (See Comments)    Nasal congestion  . Codeine Other (See Comments)    The patient passed out  . Hytrin [Terazosin] Other  (See Comments)    Reaction not recalled   Medications Prior to Admission  Medication Sig Dispense Refill  . acetaminophen (TYLENOL) 650 MG CR tablet Take 650 mg by mouth every 8 (eight) hours as needed for pain (or headaches).    . ALPRAZolam (XANAX) 1 MG tablet Take 1/2-1 tablet 2 - 3 x /day ONLY if needed for Anxiety Attack &  limit to 5 days /week to avoid addiction & Do not take while Driving (Patient taking differently: Take 0.5-1 mg by mouth See admin instructions. Take 1 mg by mouth at bedtime and an additional 0.5-1 mg one to two times a day as needed for anxiety attacks- limit to 5 days/week to avoid addiction- do not take while driving) 80 tablet 0  . Cholecalciferol (VITAMIN D3) 10000 units capsule Take 10,000 Units by mouth at bedtime.     . fenofibrate 160 MG tablet TAKE 1 TABLET BY MOUTH  DAILY FOR BLOOD FATS (Patient taking differently: Take 160 mg by mouth at bedtime. ) 90 tablet 3  . fexofenadine (ALLEGRA) 180 MG tablet Take 180 mg by mouth daily as needed for allergies or rhinitis.    . fluticasone (FLONASE) 50 MCG/ACT nasal spray Use 2 sprays each nostril  /daily (Patient taking differently: Place 2 sprays into both nostrils daily as needed for allergies or rhinitis. ) 48 g 1  . glipiZIDE (GLUCOTROL) 5 MG tablet Take 1 tablet 2 x   /day with Meals for Diabetes (Patient taking differently: Take 5 mg by mouth 2 (two) times daily with a meal. ) 180 tablet 0  . metFORMIN (GLUCOPHAGE-XR) 500 MG 24 hr tablet Take 2 tablets a day with food, decrease due to kidney function (Patient taking differently: Take 500 mg by mouth 2 (two) times daily with a meal. ) 180 tablet 3  . mupirocin ointment (BACTROBAN) 2 % APPLY TWICE A DAY TO SKIN INFECTION. (Patient taking differently: Apply 1 application topically 2 (two) times daily as needed (for cuts or abrasions- to heal). ) 66 g 1  . omeprazole (PRILOSEC) 40 MG capsule TAKE 1 CAPSULE BY MOUTH  DAILY FOR ACID REFLUX (Patient taking differently: Take  40 mg by mouth at bedtime. ) 90 capsule 3  . oxybutynin (DITROPAN) 5 MG tablet Take 1 tablet 3 x /day  For Bladder Control (Patient taking differently: Take 5 mg by mouth 3 (three) times daily. ) 270 tablet 3  . rosuvastatin (CRESTOR) 10 MG tablet Take 10 mg by mouth at bedtime.    . tamsulosin (FLOMAX) 0.4 MG CAPS capsule Take 1 capsule (0.4 mg total) by mouth daily. 90 capsule 4  . valsartan-hydrochlorothiazide (DIOVAN-HCT) 320-25 MG tablet Take 1 tablet Daily for BP (Patient taking differently: Take 1 tablet by mouth daily. ) 90 tablet 3  . Continuous Blood Gluc Sensor ([place]) MISC  Check sugars 3 x a day DX E10.9 (Patient not taking: Reported on 12/28/2019) 1 each 3  . glucose blood (FREESTYLE LITE) test strip Check blood sugar 1 time daily-DX-E11.22 100 each 5    Home: Home Living Family/patient expects to be discharged to:: (P) Private residence  Functional History:   Functional Status:  Mobility:          ADL:    Cognition: Cognition Orientation Level: Oriented X4    Blood pressure (!) 105/54, pulse 67, temperature 99.3 F (37.4 C), temperature source Oral, resp. rate 19, height 5\' 10"  (1.778 m), weight 102.1 kg, SpO2 98 %. Physical Exam  Nursing note and vitals reviewed. Constitutional: He is oriented to person, place, and time. He appears well-developed and well-nourished.  HENT:  Right facial edema Right crani incision with staples intact.   Eyes: EOM are normal. Right eye exhibits no discharge. Left eye exhibits no discharge.  Right ptosis with peri-orbital edema.   Neck: No tracheal deviation present. No thyromegaly present.  Respiratory: Effort normal. No stridor. No respiratory distress.  GI: Soft. He exhibits no distension.  Musculoskeletal:     Comments: No edema or tenderness in extremities  Neurological: He is alert and oriented to person, place, and time.  Mild dysarthria  Motor: Bilateral upper extremities: 5/5 proximal distal  Bilateral lower extremities: Hip flexion, knee extension 4+/5, ankle dorsiflexion 5/5 Sensation intact to light touch  Skin: Skin is warm and dry.  See above  Psychiatric: He has a normal mood and affect. His behavior is normal.    Results for orders placed or performed during the hospital encounter of 12/28/19 (from the past 24 hour(s))  Glucose, capillary     Status: Abnormal   Collection Time: 12/30/19 11:30 AM  Result Value Ref Range   Glucose-Capillary 265 (H) 70 - 99 mg/dL   Comment 1 Notify RN    Comment 2 Document in Chart   Glucose, capillary     Status: Abnormal   Collection Time: 12/30/19  3:15 PM  Result Value Ref Range   Glucose-Capillary 245 (H) 70 - 99 mg/dL   Comment 1 Notify RN    Comment 2 Document in Chart   Glucose, capillary     Status: Abnormal   Collection Time: 12/30/19  7:24 PM  Result Value Ref Range   Glucose-Capillary 198 (H) 70 - 99 mg/dL  Glucose, capillary     Status: Abnormal   Collection Time: 12/30/19 11:13 PM  Result Value Ref Range   Glucose-Capillary 308 (H) 70 - 99 mg/dL  Glucose, capillary     Status: Abnormal   Collection Time: 12/31/19  3:20 AM  Result Value Ref Range   Glucose-Capillary 266 (H) 70 - 99 mg/dL  CBC     Status: Abnormal   Collection Time: 12/31/19  4:44 AM  Result Value Ref Range   WBC 10.3 4.0 - 10.5 K/uL   RBC 3.57 (L) 4.22 - 5.81 MIL/uL   Hemoglobin 10.7 (L) 13.0 - 17.0 g/dL   HCT 32.3 (L) 39.0 - 52.0 %   MCV 90.5 80.0 - 100.0 fL   MCH 30.0 26.0 - 34.0 pg   MCHC 33.1 30.0 - 36.0 g/dL   RDW 15.1 11.5 - 15.5 %   Platelets 113 (L) 150 - 400 K/uL   nRBC 0.0 0.0 - 0.2 %  Basic metabolic panel     Status: Abnormal   Collection Time: 12/31/19  4:44 AM  Result Value Ref Range   Sodium  141 135 - 145 mmol/L   Potassium 4.2 3.5 - 5.1 mmol/L   Chloride 110 98 - 111 mmol/L   CO2 24 22 - 32 mmol/L   Glucose, Bld 266 (H) 70 - 99 mg/dL   BUN 16 8 - 23 mg/dL   Creatinine, Ser 1.26 (H) 0.61 - 1.24 mg/dL   Calcium 8.3 (L)  8.9 - 10.3 mg/dL   GFR calc non Af Amer 57 (L) >60 mL/min   GFR calc Af Amer >60 >60 mL/min   Anion gap 7 5 - 15  Glucose, capillary     Status: Abnormal   Collection Time: 12/31/19  8:30 AM  Result Value Ref Range   Glucose-Capillary 197 (H) 70 - 99 mg/dL   Comment 1 Notify RN    Comment 2 Document in Chart    CT HEAD WO CONTRAST  Result Date: 12/31/2019 CLINICAL DATA:  Meningioma resection EXAM: CT HEAD WITHOUT CONTRAST TECHNIQUE: Contiguous axial images were obtained from the base of the skull through the vertex without intravenous contrast. COMPARISON:  Head CT 12/28/2019 FINDINGS: Brain: Status post resection of extra-axial tumor along the right frontal convexity. There is extra-axial and intraparenchymal blood at the resection site. Intermediate area of hypoattenuation within the anterior right frontal lobe. Extra-axial collection over the anterior right convexity measures 8 mm. There is unchanged leftward midline shift of 5 mm. Vascular: No hyperdense vessel or unexpected calcification. Skull: Right frontoparietal craniotomy Sinuses/Orbits: Fluid in the frontal and anterior ethmoid sinuses. Other: None. IMPRESSION: 1. Status post resection of extra-axial tumor along the right frontal convexity. 2. Extra-axial and intraparenchymal blood at the resection site with unchanged leftward midline shift of 5 mm. Electronically Signed   By: Ulyses Jarred M.D.   On: 12/31/2019 02:51    Assessment/Plan: Diagnosis: Meningioma status post resection Labs independently reviewed.  Records reviewed and summated above.  1. Does the need for close, 24 hr/day medical supervision in concert with the patient's rehab needs make it unreasonable for this patient to be served in a less intensive setting? No  2. Co-Morbidities requiring supervision/potential complications: HTN (monitor and provide prns in accordance with increased physical exertion and pain), OSA (noncompliant with CPAP), CKD III (avoid nephrotoxic  meds), steroid-induced hyperglycemia on pre-diabetes (Monitor in accordance with exercise and adjust meds as necessary), enlarged prostate (monitor for retention), tachypnea (monitor RR and O2 Sats with increased physical exertion) 3. Due to bladder management, safety, skin/wound care, pain management and patient education, does the patient require 24 hr/day rehab nursing? Yes 4. Does the patient require coordinated care of a physician, rehab nurse, therapy disciplines of PT/OT to address physical and functional deficits in the context of the above medical diagnosis(es)? No Addressing deficits in the following areas: balance, endurance, locomotion, transferring, bathing, toileting and psychosocial support 5. Can the patient actively participate in an intensive therapy program of at least 3 hrs of therapy per day at least 5 days per week? Yes 6. The potential for patient to make measurable gains while on inpatient rehab is good and fair 7. Anticipated functional outcomes upon discharge from inpatient rehab are modified independent and supervision  with PT, modified independent and supervision with OT, n/a with SLP. 8. Estimated rehab length of stay to reach the above functional goals is: 3-5 days. 9. Anticipated discharge destination: Home 10. Overall Rehab/Functional Prognosis: good  RECOMMENDATIONS: This patient's condition is appropriate for continued rehabilitative care in the following setting: Patient making good functional gains and would like to return to  outpatient therapies in [place].  Believe this is most appropriate discharge disposition. Patient has agreed to participate in recommended program. Yes Note that insurance prior authorization may be required for reimbursement for recommended care.  Comment: Rehab Admissions Coordinator to follow up.  I have personally performed a face to face diagnostic evaluation, including, but not limited to relevant history and physical exam  findings, of this patient and developed relevant assessment and plan.  Additionally, I have reviewed and concur with the physician assistant's documentation above.   Delice Lesch, MD, ABPMR Bary Leriche, PA-C 12/31/2019

## 2019-12-31 NOTE — Progress Notes (Signed)
Patient ID: Jeffery Reed, male   DOB: 05-08-1946, 74 y.o.   MRN: [id number] Vital signs are stable Patient is more alert today and more verbally active Moving all 4 extremities well Still likes to pick at his dressing Will keep it reinforced to keep his fingers off of it He appears to be progressing steadily I believe he can be transferred to the stepdown unit We will ask rehab medicine to see him also regarding consideration of inpatient rehab Starting to taper his steroids Continue IV antibiotics as patient had frontal sinus entry with craniotomy

## 2019-12-31 NOTE — Evaluation (Signed)
Physical Therapy Evaluation Patient Details Name: Jeffery Reed MRN: [id number] DOB: [birth year] Today's Date: 12/31/2019   History of Present Illness  74 yo male s/p fall at home with confusion. CT revealed  6 x 2.7 x 4.5 cm partially calcified hyperdense mass in the right frontal lobe with associated mass-effect and 1.1 cm right to left midline shift. MRI (+)mass overlying the right frontalconvexity, most consistent with a meningioma. pt R frontal craniotomy with gross total excision of meningioma 5/10 . 5/11 removal tick form R thigh PMH: HTN DM2, CKD, OSA R foot fx 2012  Clinical Impression  Patient presents with decreased mobility due to fall at home and now s/p craniotomy with imbalance, some L LE weakness and, prior to surgery, pain.  He was functioning at home relatively independently except two recent falls.  Currently he is min to minguard A for mobility and still has some balance deficits with risk for falls.  Wife unable to assist much, but may have enough for 24 hour initially with neighbor and grandson and possibly other family members to assist.  Feel he is appropriate for outpatient PT at d/c.  PT to follow acutely.     Follow Up Recommendations Outpatient PT;Supervision/Assistance - 24 hour    Equipment Recommendations  Other (comment)(possibly cane, TBA)    Recommendations for Other Services       Precautions / Restrictions Precautions Precautions: Fall Precaution Comments: hx of fall (2 in 6 months)      Mobility  Bed Mobility Overal bed mobility: Needs Assistance Bed Mobility: Supine to Sit     Supine to sit: Supervision;HOB elevated     General bed mobility comments: pulled up on railing  Transfers Overall transfer level: Needs assistance Equipment used: None;1 person hand held assist Transfers: Sit to/from Stand Sit to Stand: Min assist         General transfer comment: for balance, safety, lines  Ambulation/Gait Ambulation/Gait assistance: Min  assist;Min guard Gait Distance (Feet): 130 Feet Assistive device: None(occasional wall rail use) Gait Pattern/deviations: Step-through pattern;Decreased stride length;Drifts right/left     General Gait Details: holding wall rail at times, some instability noted especially with turns. reports some imbalance since "lost his toe" when he broke his foot  Stairs            Wheelchair Mobility    Modified Rankin (Stroke Patients Only)       Balance Overall balance assessment: Needs assistance Sitting-balance support: No upper extremity supported;Feet supported Sitting balance-Leahy Scale: Good     Standing balance support: No upper extremity supported Standing balance-Leahy Scale: Fair Standing balance comment: initially standing with minguard A, then able to stand with S after up for a minute, stood in bathroom to urinate in toilet and washed L hand (R hand with pulse ox)                             Pertinent Vitals/Pain Pain Assessment: 0-10 Pain Score: 5  Pain Descriptors / Indicators: Headache Pain Intervention(s): Monitored during session;Repositioned;Premedicated before session    Home Living Family/patient expects to be discharged to:: Private residence Living Arrangements: Spouse/significant other;Other relatives Available Help at Discharge: Family Type of Home: House Home Access: Stairs to enter Entrance Stairs-Rails: Right Entrance Stairs-Number of Steps: 3 Home Layout: One level Home Equipment: Grab bars - tub/shower;Walker - 2 wheels;Kasandra Knudsen - single point Additional Comments: wife Vickii Chafe; 103 y/o grandson lives there too works 6-4 daily, friend next  door visiting as wife cannot visit and she can help till 4 when 74 y/o gets off the bus    Prior Function Level of Independence: Independent         Comments: driving school bus      Journalist, newspaper        Extremity/Trunk Assessment   Upper Extremity Assessment Upper Extremity Assessment:  Overall WFL for tasks assessed    Lower Extremity Assessment Lower Extremity Assessment: LLE deficits/detail;Overall WFL for tasks assessed LLE Deficits / Details: no noted deficits with testing, but some functional weakness with turns and direction changes LLE Sensation: WNL       Communication   Communication: Expressive difficulties  Cognition Arousal/Alertness: Awake/alert Behavior During Therapy: WFL for tasks assessed/performed Overall Cognitive Status: Impaired/Different from baseline Area of Impairment: Orientation                 Orientation Level: Time(Jan 09, 2020)                    General Comments General comments (skin integrity, edema, etc.): close family friend there visiting and talking with wife over the phone about pt's status    Exercises     Assessment/Plan    PT Assessment Patient needs continued PT services  PT Problem List Decreased strength;Decreased activity tolerance;Decreased mobility;Decreased cognition;Decreased balance;Decreased knowledge of use of DME;Decreased knowledge of precautions;Decreased safety awareness       PT Treatment Interventions Gait training;Functional mobility training;Therapeutic exercise;Neuromuscular re-education;Patient/family education;Cognitive remediation;Balance training    PT Goals (Current goals can be found in the Care Plan section)  Acute Rehab PT Goals Patient Stated Goal: to go home PT Goal Formulation: With patient/family Time For Goal Achievement: 01/06/20 Potential to Achieve Goals: Good    Frequency     Barriers to discharge        Co-evaluation PT/OT/SLP Co-Evaluation/Treatment: Yes Reason for Co-Treatment: For patient/therapist safety;To address functional/ADL transfers PT goals addressed during session: Mobility/safety with mobility;Balance;Strengthening/ROM OT goals addressed during session: ADL's and self-care;Proper use of Adaptive equipment and DME;Strengthening/ROM        AM-PAC PT "6 Clicks" Mobility  Outcome Measure Help needed turning from your back to your side while in a flat bed without using bedrails?: None Help needed moving from lying on your back to sitting on the side of a flat bed without using bedrails?: None Help needed moving to and from a bed to a chair (including a wheelchair)?: A Little Help needed standing up from a chair using your arms (e.g., wheelchair or bedside chair)?: A Little Help needed to walk in hospital room?: A Little Help needed climbing 3-5 steps with a railing? : A Little 6 Click Score: 20    End of Session Equipment Utilized During Treatment: Gait belt Activity Tolerance: Patient tolerated treatment well Patient left: in chair;with call bell/phone within reach;with chair alarm set;with family/visitor present Nurse Communication: Mobility status PT Visit Diagnosis: Other abnormalities of gait and mobility (R26.89);Repeated falls (R29.6);Other symptoms and signs involving the nervous system (R29.898)    Time: [id number] PT Time Calculation (min) (ACUTE ONLY): 29 min   Charges:   PT Evaluation $PT Eval Moderate Complexity: [place], [place] Acute Rehabilitation Services [phone] 12/31/2019   Reginia Naas 12/31/2019, 12:24 PM

## 2020-01-01 LAB — BASIC METABOLIC PANEL
Anion gap: 7 (ref 5–15)
BUN: 15 mg/dL (ref 8–23)
CO2: 24 mmol/L (ref 22–32)
Calcium: 8.1 mg/dL — ABNORMAL LOW (ref 8.9–10.3)
Chloride: 110 mmol/L (ref 98–111)
Creatinine, Ser: 1.14 mg/dL (ref 0.61–1.24)
GFR calc Af Amer: >60 mL/min (ref >60)
GFR calc non Af Amer: >60 mL/min (ref >60)
Glucose, Bld: 292 mg/dL — ABNORMAL HIGH (ref 70–99)
Potassium: 4.1 mmol/L (ref 3.5–5.1)
Sodium: 141 mmol/L (ref 135–145)

## 2020-01-01 LAB — GLUCOSE, CAPILLARY
Glucose-Capillary: 296 mg/dL — ABNORMAL HIGH (ref 70–99)
Glucose-Capillary: 268 mg/dL — ABNORMAL HIGH (ref 70–99)
Glucose-Capillary: 235 mg/dL — ABNORMAL HIGH (ref 70–99)
Glucose-Capillary: 168 mg/dL — ABNORMAL HIGH (ref 70–99)

## 2020-01-01 LAB — CBC
HCT: 28.7 % — ABNORMAL LOW (ref 39.0–52.0)
Hemoglobin: 9.6 g/dL — ABNORMAL LOW (ref 13.0–17.0)
MCH: 29.5 pg (ref 26.0–34.0)
MCHC: 33.4 g/dL (ref 30.0–36.0)
MCV: 88.3 fL (ref 80.0–100.0)
Platelets: 134 10*3/uL — ABNORMAL LOW (ref 150–400)
RBC: 3.25 MIL/uL — ABNORMAL LOW (ref 4.22–5.81)
RDW: 14.7 % (ref 11.5–15.5)
WBC: 7.3 10*3/uL (ref 4.0–10.5)
nRBC: 0 % (ref 0.0–0.2)

## 2020-01-01 MED ORDER — INSULIN GLARGINE 100 UNIT/ML ~~LOC~~ SOLN
24.0000 [IU] | Freq: Every day | SUBCUTANEOUS | Status: DC
  Administered 2020-01-02: 24 [IU] via SUBCUTANEOUS
  Filled 2020-01-01: qty 0.24

## 2020-01-01 MED ORDER — INSULIN ASPART 100 UNIT/ML ~~LOC~~ SOLN
0.0000 [IU] | Freq: Three times a day (TID) | SUBCUTANEOUS | Status: DC
  Administered 2020-01-01: 5 [IU] via SUBCUTANEOUS
  Administered 2020-01-01 (×2): 8 [IU] via SUBCUTANEOUS
  Administered 2020-01-02: 3 [IU] via SUBCUTANEOUS

## 2020-01-01 MED ORDER — VALSARTAN-HYDROCHLOROTHIAZIDE 320-25 MG PO TABS: 1.0000 | ORAL_TABLET | Freq: Every day | ORAL | Status: DC

## 2020-01-01 MED ORDER — INSULIN ASPART 100 UNIT/ML ~~LOC~~ SOLN: 0.0000 [IU] | Freq: Every day | SUBCUTANEOUS | Status: DC

## 2020-01-01 MED ORDER — INSULIN GLARGINE 100 UNIT/ML ~~LOC~~ SOLN
4.0000 [IU] | Freq: Once | SUBCUTANEOUS | Status: AC
  Administered 2020-01-01: 4 [IU] via SUBCUTANEOUS
  Filled 2020-01-01: qty 0.04

## 2020-01-01 MED ORDER — HYDROCHLOROTHIAZIDE 25 MG PO TABS
25.0000 mg | ORAL_TABLET | Freq: Every day | ORAL | Status: DC
  Administered 2020-01-01 – 2020-01-02 (×2): 25 mg via ORAL
  Filled 2020-01-01 (×2): qty 1

## 2020-01-01 MED ORDER — INSULIN ASPART 100 UNIT/ML ~~LOC~~ SOLN
10.0000 [IU] | Freq: Three times a day (TID) | SUBCUTANEOUS | Status: DC
  Administered 2020-01-01: 10 [IU] via SUBCUTANEOUS

## 2020-01-01 MED ORDER — INSULIN GLARGINE 100 UNIT/ML ~~LOC~~ SOLN
20.0000 [IU] | Freq: Every day | SUBCUTANEOUS | Status: DC
  Administered 2020-01-01: 20 [IU] via SUBCUTANEOUS
  Filled 2020-01-01: qty 0.2

## 2020-01-01 MED ORDER — IRBESARTAN 300 MG PO TABS
300.0000 mg | ORAL_TABLET | Freq: Every day | ORAL | Status: DC
  Administered 2020-01-01 – 2020-01-02 (×2): 300 mg via ORAL
  Filled 2020-01-01 (×2): qty 1

## 2020-01-01 MED ORDER — INSULIN ASPART 100 UNIT/ML ~~LOC~~ SOLN
15.0000 [IU] | Freq: Three times a day (TID) | SUBCUTANEOUS | Status: DC
  Administered 2020-01-01 – 2020-01-02 (×3): 15 [IU] via SUBCUTANEOUS

## 2020-01-01 NOTE — Progress Notes (Signed)
Physical Therapy Treatment Patient Details Name: Jeffery Reed MRN: [id number] DOB: February 05, 1946 Today's Date: 01/01/2020    History of Present Illness 74 yo male s/p fall at home with confusion. CT revealed  6 x 2.7 x 4.5 cm partially calcified hyperdense mass in the right frontal lobe with associated mass-effect and 1.1 cm right to left midline shift. MRI (+)mass overlying the right frontalconvexity, most consistent with a meningioma. pt R frontal craniotomy with gross total excision of meningioma 5/10 . 5/11 removal tick form R thigh PMH: HTN DM2, CKD, OSA R foot fx 2012    PT Comments    Patient more stable utilizing RW this session.  Still some imbalance noted like pulling up briefs in standing legs braced against chair and needed redirection approaching steps was going to leave walker, educated to bring with him till he reached railing.  Feel he will progress and come off walker with continued skilled PT in the outpatient setting after d/c, but recommend RW for use at home initially upon d/c.    Follow Up Recommendations  Outpatient PT;Supervision/Assistance - 24 hour     Equipment Recommendations  None recommended by PT    Recommendations for Other Services       Precautions / Restrictions Precautions Precautions: Fall Precaution Comments: hx of fall (2 in 6 months)    Mobility  Bed Mobility Overal bed mobility: Needs Assistance Bed Mobility: Supine to Sit     Supine to sit: Supervision;HOB elevated     General bed mobility comments: wearing depends, but not aware had leaked through to bed linens  Transfers Overall transfer level: Needs assistance Equipment used: Rolling walker (2 wheeled) Transfers: Sit to/from Stand Sit to Stand: Min guard         General transfer comment: for balance, lines  Ambulation/Gait Ambulation/Gait assistance: Supervision Gait Distance (Feet): 220 Feet Assistive device: Rolling walker (2 wheeled) Gait Pattern/deviations:  Step-through pattern;Decreased stride length     General Gait Details: steady with RW and noted no LOB on turns and no increased pain on L LE with turns as was usual for him PTA   Stairs Stairs: Yes Stairs assistance: Supervision;Min guard Stair Management: One rail Left;Step to pattern;Forwards Number of Stairs: 2(x 2) General stair comments: initially self selected sequence, c/o L LE pain, then educated to lead up with R and down with L first with improved comfort   Wheelchair Mobility    Modified Rankin (Stroke Patients Only)       Balance Overall balance assessment: Needs assistance   Sitting balance-Leahy Scale: Good       Standing balance-Leahy Scale: Fair Standing balance comment: static standing without UE support, used RW today for ambulation with improved safety/independence                            Cognition Arousal/Alertness: Awake/alert Behavior During Therapy: WFL for tasks assessed/performed Overall Cognitive Status: Within Functional Limits for tasks assessed                                        Exercises      General Comments        Pertinent Vitals/Pain Pain Assessment: Faces Faces Pain Scale: Hurts little more Pain Location: L thigh with stair climbing Pain Descriptors / Indicators: Aching Pain Intervention(s): Monitored during session;Other (comment)(educated in optional sequence to reduces  stress on L LE)    Home Living                      Prior Function            PT Goals (current goals can now be found in the care plan section) Progress towards PT goals: Progressing toward goals    Frequency    Min 3X/week      PT Plan Current plan remains appropriate    Co-evaluation              AM-PAC PT "6 Clicks" Mobility   Outcome Measure  Help needed turning from your back to your side while in a flat bed without using bedrails?: None Help needed moving from lying on your back to  sitting on the side of a flat bed without using bedrails?: None Help needed moving to and from a bed to a chair (including a wheelchair)?: None Help needed standing up from a chair using your arms (e.g., wheelchair or bedside chair)?: A Little Help needed to walk in hospital room?: None Help needed climbing 3-5 steps with a railing? : A Little 6 Click Score: 22    End of Session   Activity Tolerance: Patient tolerated treatment well Patient left: in chair;with call bell/phone within reach   PT Visit Diagnosis: Other abnormalities of gait and mobility (R26.89);Repeated falls (R29.6);Other symptoms and signs involving the nervous system (R29.898)     Time: [id number] PT Time Calculation (min) (ACUTE ONLY): 27 min  Charges:  $Gait Training: 8-22 mins $Therapeutic Activity: 8-22 mins                     Magda Kiel, [place] [phone] 01/01/2020    Reginia Naas 01/01/2020, 3:25 PM

## 2020-01-01 NOTE — Progress Notes (Signed)
Inpatient Rehabilitation-Admissions Coordinator   Spoke with pt bedside. He feels he is moving better today. AC reviewed PM&R recommendation for outpatient therapy. Pt in agreement. With permission, also discussed with his wife via phone. AC will sign off and will relay recommendation to [facility] team. Please call if questions.   Raechel Ache, OTR/L  Rehab Admissions Coordinator  [phone] 01/01/2020 2:51 PM

## 2020-01-01 NOTE — Progress Notes (Signed)
PROGRESS NOTE  Jeffery Reed [id number] DOB: 1945/10/09 DOA: 12/28/2019 PCP: Unk Pinto, MD  HPI/Recap of past 24 hours: Mr. Bau is a 74 yo CM with PMH HTN, DMII, CKD, OSA who presented to the ER after a fall at home and confusion. It was unknown if he syncopized and fell vs mechanical fall and hit head; patient unable to recall events.  The last thing he remembers is doing some yard work earlier today. His daughter is present at bedside in the ER who helps provide further collateral information. She states that they have noticed a very slow gradual cognitive change since December.   Over the past 2 weeks he has been more forgetful and has been complaining of a daily headache for the past 1 week requiring taking 1 Tylenol arthritis daily which relieves his headache. There was some consideration of dementia however no formal work-up had been commenced.  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  He has only started over the past couple weeks. He does not have a significant fall history at home but does endorse that his left thigh has been more sore recently for unknown reasons.  He has been evaluated outpatient by Ortho with plans for an MRI which has not taken place as of yet.  He has also been participating in physical therapy with no improvement. Other than his headaches, he denies any vision changes or double vision.  He denies any focal weakness nor any numbness or tingling.  His balance is normal and appropriate.  He did fracture his right foot several years ago (2012) which he says does affect his balance at times.  Due to his altered mental status and fall, he underwent CT head upon arrival to the ER which revealed a large 6 x 2.7 x 4.5 cm partially calcified hyperdense mass in the right frontal lobe with associated mass-effect and 1.1 cm right to left midline shift.   He then underwent MRI brain revealing: "7.0 x 2.9 x 3.1 cm extra  axial mass overlying the right frontal convexity, most consistent with a meningioma. Associated mass effect with vasogenic edema within the underlying right cerebral hemisphere with up to 8 mm of right-to-left shift."  NSG was consulted from the ER to evaluate as well. Patient received 10 mg IV Decadron for vasogenic edema seen on brain imaging.  Post right frontal craniotomy and gross total excision of meningioma on 12/29/19 by Dr. Ellene Route, neurosurgery.  01/01/20: POD #3 post gross total excision of right frontal lobe meningioma.  Seen and examined.  Alert and oriented x3.  Denies any headaches.  Vital signs are stable.  No acute issues.  Dr. Mickeal Skinner radiation oncology will see in consultation for brain hemangioma.  Assessment/Plan: Active Problems:   Frontal mass of brain   Brain mass   Noncompliance with CPAP treatment   Stage 3 chronic kidney disease   Steroid-induced hyperglycemia   Prediabetes   Enlarged prostate   Tachypnea   Right frontal lobe mass post R frontal craniotomy 12/29/19 by Dr. Ellene Route - underwent CT head and MRI brain in ER. MRI reveals 7 x 2.9 x 3.1 cm mass in right frontal lobe with vasogenic edema and R to L midline shift, 8 mm - recent HA likely related to mass; wife mentioned "staring spells" so will need close monitoring for seizure like activity as well; discussed with Dr. Ellene Route;  Post excision as stated above. -Currently on po decadron 4 mg  twice daily and Keppra 500 mg twice daily -On IV cefazolin prophylactically perisurgically-afebrile with no leukocytosis.  Newly diagnosed right frontal lobe brain Meningioma As evidenced by pathology result Radiation oncology consulted.  Type 2 diabetes with hyperglycemia exacerbated by steroids A1c 8.1 on 12/29/2019 Lantus dose increased to 24 units daily NovoLog dose increased to 15 units 3 times daily. Continue insulin sliding scale Moderate scale Goal is to maintain CBG less than 200.  Essential hypertension BP  not at goal Resume home antihypertensive valsartan/HCTZ Continue to monitor vital signs  Resolved Leukocytosis likely reactive in the setting of IV steroids use No evidence of active infective process He is on IV cefazolin prophylactically perisurgery Currently afebrile with no leukocytosis  AKI likely prerenal in the setting of dehydration Received IV fluids, fluid discontinued today as patient has good oral intake and does not appear volume depleted. Back to baseline with creatinine 1.1 with GFR greater than 60. Creatinine 1.1 from 1.48 Continue to avoid nephrotoxins Continue to monitor urine output  OSA Continue CPAP at night, he has been compliant.  BPH Contniue tamsulosin Monitor UO  GERD Stable Continue PPI   Code Status:  Full DVT Prophylaxis: SCDs Patient is from home Anticipated disposition is: Possibly CIR versus outpatient PT OT Barrier to discharge: Recent neurosurgical procedure     Objective: Vitals:   12/31/19 2059 01/01/20 0000 01/01/20 0410 01/01/20 0755  BP:   132/76 (!) 144/79  Pulse: 70  (!) 58 67  Resp: (!) 24  20 18   Temp:  98.2 F (36.8 C) 98.4 F (36.9 C) 98.4 F (36.9 C)  TempSrc:    Axillary  SpO2:   95% 98%  Weight:      Height:        Intake/Output Summary (Last 24 hours) at 01/01/2020 0945 Last data filed at 12/31/2019 1700 Gross per 24 hour  Intake 910.15 ml  Output --  Net 910.15 ml   Filed Weights   12/28/19 1712  Weight: 102.1 kg    Exam: continues to improve  . General: 74 y.o. year-old male well-developed well-nourished in no acute stress.  Alert and oriented x3.   . Cardiovascular: Regular rate and rhythm no rubs or gallops. Marland Kitchen Respiratory: Clear to Auscultation No Wheezes or Rales.   . Abdomen: Soft nontender monitor normal bowel sounds present.   . Musculoskeletal: No lower extremity edema bilaterally.   Marland Kitchen Psychiatry: Mood is appropriate for condition and setting.  Data Reviewed: CBC: Recent Labs  Lab  12/28/19 1812 12/28/19 1812 12/29/19 0416 12/29/19 0416 12/29/19 1753 12/29/19 1911 12/30/19 0712 12/31/19 0444 01/01/20 0637  WBC 10.9*  --  9.1  --   --   --  15.4* 10.3 7.3  HGB 13.3   < > 13.7   < > 11.2* 10.9* 11.9* 10.7* 9.6*  HCT 38.9*   < > 40.5   < > 33.0* 32.0* 35.7* 32.3* 28.7*  MCV 85.9  --  85.8  --   --   --  89.3 90.5 88.3  PLT 171  --  173  --   --   --  142* 113* 134*   < > = values in this interval not displayed.   Basic Metabolic Panel: Recent Labs  Lab 12/28/19 1812 12/28/19 1812 12/29/19 0416 12/29/19 0416 12/29/19 1753 12/29/19 1911 12/30/19 0712 12/31/19 0444 01/01/20 0637  NA 140   < > 139   < > 141 142 144 141 141  K 3.8   < > 3.9   < >  3.8 4.2 4.0 4.2 4.1  CL 105   < > 104  --  105  --  110 110 110  CO2 23  --  24  --   --   --  23 24 24   GLUCOSE 248*   < > 347*  --  243*  --  261* 266* 292*  BUN 21   < > 20  --  20  --  18 16 15   CREATININE 1.70*   < > 1.59*  --  1.40*  --  1.48* 1.26* 1.14  CALCIUM 9.0  --  9.2  --   --   --  8.1* 8.3* 8.1*  MG  --   --  1.9  --   --   --   --   --   --    < > = values in this interval not displayed.   GFR: Estimated Creatinine Clearance: 70.1 mL/min (by C-G formula based on SCr of 1.14 mg/dL). Liver Function Tests: Recent Labs  Lab 12/28/19 1812  AST 29  ALT 17  ALKPHOS 48  BILITOT 0.9  PROT 6.3*  ALBUMIN 3.7   No results for input(s): LIPASE, AMYLASE in the last 168 hours. No results for input(s): AMMONIA in the last 168 hours. Coagulation Profile: No results for input(s): INR, PROTIME in the last 168 hours. Cardiac Enzymes: No results for input(s): CKTOTAL, CKMB, CKMBINDEX, TROPONINI in the last 168 hours. BNP (last 3 results) No results for input(s): PROBNP in the last 8760 hours. HbA1C: No results for input(s): HGBA1C in the last 72 hours. CBG: Recent Labs  Lab 12/31/19 1207 12/31/19 1603 12/31/19 1932 12/31/19 2139 01/01/20 0851  GLUCAP 216* 225* 306* 329* 296*   Lipid  Profile: No results for input(s): CHOL, HDL, LDLCALC, TRIG, CHOLHDL, LDLDIRECT in the last 72 hours. Thyroid Function Tests: No results for input(s): TSH, T4TOTAL, FREET4, T3FREE, THYROIDAB in the last 72 hours. Anemia Panel: No results for input(s): VITAMINB12, FOLATE, FERRITIN, TIBC, IRON, RETICCTPCT in the last 72 hours. Urine analysis:    Component Value Date/Time   COLORURINE YELLOW 12/28/2019 2142   APPEARANCEUR CLOUDY (A) 12/28/2019 2142   LABSPEC 1.023 12/28/2019 2142   PHURINE 5.0 12/28/2019 2142   GLUCOSEU >=500 (A) 12/28/2019 2142   HGBUR NEGATIVE 12/28/2019 2142   BILIRUBINUR NEGATIVE 12/28/2019 2142   KETONESUR NEGATIVE 12/28/2019 2142   PROTEINUR NEGATIVE 12/28/2019 2142   NITRITE NEGATIVE 12/28/2019 2142   LEUKOCYTESUR NEGATIVE 12/28/2019 2142   Sepsis Labs: @LABRCNTIP (procalcitonin:4,lacticidven:4)  ) Recent Results (from the past 240 hour(s))  Respiratory Panel by RT PCR (Flu A&B, Covid) - Urine, Clean Catch     Status: None   Collection Time: 12/28/19  9:32 PM   Specimen: Urine, Clean Catch  Result Value Ref Range Status   SARS Coronavirus 2 by RT PCR NEGATIVE NEGATIVE Final    Comment: (NOTE) SARS-CoV-2 target nucleic acids are NOT DETECTED. The SARS-CoV-2 RNA is generally detectable in upper respiratoy specimens during the acute phase of infection. The lowest concentration of SARS-CoV-2 viral copies this assay can detect is 131 copies/mL. A negative result does not preclude SARS-Cov-2 infection and should not be used as the sole basis for treatment or other patient management decisions. A negative result may occur with  improper specimen collection/handling, submission of specimen other than nasopharyngeal swab, presence of viral mutation(s) within the areas targeted by this assay, and inadequate number of viral copies (<131 copies/mL). A negative result must be combined with clinical observations,  patient history, and epidemiological information.  The expected result is Negative. Fact Sheet for Patients:  PinkCheek.be Fact Sheet for Healthcare Providers:  GravelBags.it This test is not yet ap proved or cleared by the [place] FDA and  has been authorized for detection and/or diagnosis of SARS-CoV-2 by FDA under an Emergency Use Authorization (EUA). This EUA will remain  in effect (meaning this test can be used) for the duration of the COVID-19 declaration under Section 564(b)(1) of the Act, 21 U.S.C. section 360bbb-3(b)(1), unless the authorization is terminated or revoked sooner.    Influenza A by PCR NEGATIVE NEGATIVE Final   Influenza B by PCR NEGATIVE NEGATIVE Final    Comment: (NOTE) The Xpert Xpress SARS-CoV-2/FLU/RSV assay is intended as an aid in  the diagnosis of influenza from Nasopharyngeal swab specimens and  should not be used as a sole basis for treatment. Nasal washings and  aspirates are unacceptable for Xpert Xpress SARS-CoV-2/FLU/RSV  testing. Fact Sheet for Patients: PinkCheek.be Fact Sheet for Healthcare Providers: GravelBags.it This test is not yet approved or cleared by the [place] FDA and  has been authorized for detection and/or diagnosis of SARS-CoV-2 by  FDA under an Emergency Use Authorization (EUA). This EUA will remain  in effect (meaning this test can be used) for the duration of the  Covid-19 declaration under Section 564(b)(1) of the Act, 21  U.S.C. section 360bbb-3(b)(1), unless the authorization is  terminated or revoked. Performed at [facility], [place] [address], [place], [place] [address]   MRSA PCR Screening     Status: None   Collection Time: 12/29/19 10:40 PM   Specimen: Nasal Mucosa; Nasopharyngeal  Result Value Ref Range Status   MRSA by PCR NEGATIVE NEGATIVE Final    Comment:        The GeneXpert MRSA Assay (FDA approved for NASAL  specimens only), is one component of a comprehensive MRSA colonization surveillance program. It is not intended to diagnose MRSA infection nor to guide or monitor treatment for MRSA infections. Performed at [facility], [place] [address] [place], [place] [address]       Studies: No results found.  Scheduled Meds: . sodium chloride   Intravenous Once  . Chlorhexidine Gluconate Cloth  6 each Topical Daily  . dexamethasone  4 mg Oral Q12H  . docusate sodium  100 mg Oral BID  . insulin aspart  0-15 Units Subcutaneous TID WC  . insulin aspart  0-5 Units Subcutaneous QHS  . insulin aspart  10 Units Subcutaneous TID WC  . insulin glargine  20 Units Subcutaneous Daily  . levETIRAcetam  500 mg Oral BID  . lidocaine (PF)  2 mL Other Once  . pantoprazole  40 mg Oral Daily  . senna  1 tablet Oral BID  . tamsulosin  0.4 mg Oral Daily    Continuous Infusions: . sodium chloride Stopped (12/29/19 1951)  .  ceFAZolin (ANCEF) IV 1 g (01/01/20 0510)     LOS: 4 days     Kayleen Memos, MD [facility] Pager [phone]  If 7PM-7AM, please contact night-coverage [email or web address] Password [facility] 01/01/2020, 9:45 AM

## 2020-01-02 LAB — BASIC METABOLIC PANEL
Anion gap: 9 (ref 5–15)
BUN: 22 mg/dL (ref 8–23)
CO2: 24 mmol/L (ref 22–32)
Calcium: 8.6 mg/dL — ABNORMAL LOW (ref 8.9–10.3)
Chloride: 107 mmol/L (ref 98–111)
Creatinine, Ser: 1.24 mg/dL (ref 0.61–1.24)
GFR calc Af Amer: >60 mL/min (ref >60)
GFR calc non Af Amer: 58 mL/min — ABNORMAL LOW (ref >60)
Glucose, Bld: 217 mg/dL — ABNORMAL HIGH (ref 70–99)
Potassium: 4 mmol/L (ref 3.5–5.1)
Sodium: 140 mmol/L (ref 135–145)

## 2020-01-02 LAB — CBC
HCT: 30.5 % — ABNORMAL LOW (ref 39.0–52.0)
Hemoglobin: 9.9 g/dL — ABNORMAL LOW (ref 13.0–17.0)
MCH: 29.1 pg (ref 26.0–34.0)
MCHC: 32.5 g/dL (ref 30.0–36.0)
MCV: 89.7 fL (ref 80.0–100.0)
Platelets: 129 10*3/uL — ABNORMAL LOW (ref 150–400)
RBC: 3.4 MIL/uL — ABNORMAL LOW (ref 4.22–5.81)
RDW: 14.7 % (ref 11.5–15.5)
WBC: 6.4 10*3/uL (ref 4.0–10.5)
nRBC: 0 % (ref 0.0–0.2)

## 2020-01-02 LAB — GLUCOSE, CAPILLARY
Glucose-Capillary: 165 mg/dL — ABNORMAL HIGH (ref 70–99)
Glucose-Capillary: 166 mg/dL — ABNORMAL HIGH (ref 70–99)

## 2020-01-02 MED ORDER — DEXAMETHASONE 2 MG PO TABS: 2.0000 mg | ORAL_TABLET | Freq: Two times a day (BID) | ORAL | 0 refills | Status: AC

## 2020-01-02 MED ORDER — LEVETIRACETAM 500 MG PO TABS: 500.0000 mg | ORAL_TABLET | Freq: Two times a day (BID) | ORAL | 0 refills | Status: DC

## 2020-01-02 MED ORDER — FENOFIBRATE 160 MG PO TABS: 160.0000 mg | ORAL_TABLET | Freq: Every day | ORAL | Status: DC

## 2020-01-02 MED ORDER — CEPHALEXIN 500 MG PO CAPS: 500.0000 mg | ORAL_CAPSULE | Freq: Three times a day (TID) | ORAL | 0 refills | Status: AC

## 2020-01-02 MED ORDER — ROSUVASTATIN CALCIUM 5 MG PO TABS: 10.0000 mg | ORAL_TABLET | Freq: Every day | ORAL | Status: DC

## 2020-01-02 MED ORDER — CEPHALEXIN 500 MG PO CAPS: 500.0000 mg | ORAL_CAPSULE | Freq: Three times a day (TID) | ORAL | Status: DC

## 2020-01-02 MED ORDER — POLYETHYLENE GLYCOL 3350 17 G PO PACK: 17.0000 g | PACK | Freq: Every day | ORAL | 0 refills | Status: DC | PRN

## 2020-01-02 NOTE — Discharge Instructions (Signed)
Meningioma Meningioma is a tumor that occurs in the thin tissue that covers the brain and spinal cord (meninges). Meningiomas are usually not cancerous (benign) and do not spread to other areas. In rare cases, a meningioma may become cancerous (malignant). What are the causes? In many cases, the cause of this condition is not known. In some cases, meningioma may be caused by:  Having a genetic disorder that causes multiple soft tumors (neurofibromatosis 2).  A change in certain genes (genetic mutation). What increases the risk? You are more likely to develop this condition if:  You have been exposed to radiation.  You are an older woman. Older women have a higher risk of meningiomas than men or children. However, men have a higher risk of malignant meningiomas.  You have injured your head in the past.  You have a history of breast cancer. What are the signs or symptoms? Symptoms of this condition usually begin very slowly. The symptoms may depend on the size and location of the tumor. Possible symptoms include:  Headaches.  Nausea and vomiting.  Vision changes.  Hearing changes.  Loss of the sense of smell.  Fits of uncontrolled movements (seizures).  Weakness or numbness on one side of the body or in an arm or leg.  Mood or personality changes.  Problems with memory or thinking. How is this diagnosed? This condition is diagnosed based on:  Results of brain imaging tests, such as a CT scan or MRI.  Removal and testing of a sample of the tumor (biopsy). This may be done to confirm the diagnosis and to help determine the best treatment for the condition. How is this treated? You may not have treatment until your symptoms start to affect your daily activities. This is because meningioma grows so slowly, and your health care provider may prefer to monitor its growth before starting treatment. If you do need treatment, it may include:  Medicines to decrease brain swelling  and improve symptoms (steroids).  High-energy rays (radiation therapy) to shrink or kill the tumor.  Anti-cancer medicines (chemotherapy) to shrink or kill the tumor. Chemotherapy has many side effects because it also kills healthy cells.  Targeted therapy. This kills cancerous cells without affecting normal cells.  Surgery to remove as much of the tumor as possible. Follow these instructions at home:   Take over-the-counter and prescription medicines only as told by your health care provider.  Keep all follow-up visits as told by your health care provider. This is important. You may need regular visits to monitor the growth of your tumor. Contact a health care provider if:  You have symptoms that come back.  You have diarrhea.  You vomit.  You have abdominal pain.  You cannot eat or drink as much as you need.  You are weaker or more tired than usual.  You are losing weight without trying. Get help right away if:  Your diarrhea, vomiting, or abdominal pain does not go away.  You have new symptoms, such as vision problems or difficulty walking.  You have a seizure.  You have bleeding that does not stop.  You have trouble breathing.  You have a fever. Summary  Meningioma is a tumor that occurs in the thin tissue that covers the brain and spinal cord (meninges).  Meningiomas are usually benign, which means they are not cancerous and do not spread to other areas.  Symptoms of this condition usually begin very slowly. The symptoms may depend on the size and location of   the tumor.  Your tumor may be monitored over time. You may not need treatment until your tumor starts to affect your daily life. This information is not intended to replace advice given to you by your health care provider. Make sure you discuss any questions you have with your health care provider. Document Revised: 07/20/2017 Document Reviewed: 08/11/2016 Elsevier Patient Education  2020 Elsevier  Inc.   

## 2020-01-02 NOTE — Progress Notes (Signed)
Patient ID: Jeffery Reed, male   DOB: August 23, 1945, 74 y.o.   MRN: [id number] Ms. Niranjan Nutall appears to be improving steadily. His speech is much more fluent and movement is good.  I believe at this time that he is ready for discharge.  He can be sent home on Decadron 2 mg twice daily and I will see him next week for staple removal from his scalp and further follow-up.  Also right further Decadron taper at that time.

## 2020-01-02 NOTE — Progress Notes (Signed)
Gave discharge instructions to pt and visitor. Removed bilateral PIV patient tolerated well. Pt taken to personal vehicle via wc. Anadia Helmes [employer]

## 2020-01-02 NOTE — Progress Notes (Signed)
Neurosurgery

## 2020-01-02 NOTE — TOC Transition Note (Signed)
Transition of Care [facility]) - CM/SW Discharge Note Marvetta Gibbons RN,BSN Transitions of Care Unit 4NP (non trauma) - RN Case Manager [phone]   Patient Details  Name: Jeffery Reed MRN: [id number] Date of Birth: 1946/06/02  Transition of Care [facility]) CM/SW Contact:  Dawayne Patricia, RN Phone Number: 01/02/2020, 11:15 AM   Clinical Narrative:    Pt stable for transition home, per MD verbal order given for outpt PT/OT- referral made to [facility] Neuro rehab via epic   Final next level of care: OP Rehab Barriers to Discharge: No Barriers Identified   Patient Goals and CMS Choice        Discharge Placement    Home with outpt PT/OT referral                     Discharge Plan and Services                                     Social Determinants of Health (SDOH) Interventions     Readmission Risk Interventions Readmission Risk Prevention Plan 01/02/2020  Transportation Screening Complete  PCP or Specialist Appt within 5-7 Days Complete  Home Care Screening Complete  Medication Review (RN CM) Complete  Some recent data might be hidden

## 2020-01-02 NOTE — Progress Notes (Signed)
Occupational Therapy Treatment Patient Details Name: Jeffery Reed MRN: [id number] DOB: 1946-04-13 Today's Date: 01/02/2020    History of present illness 74 yo male s/p fall at home with confusion. CT revealed  6 x 2.7 x 4.5 cm partially calcified hyperdense mass in the right frontal lobe with associated mass-effect and 1.1 cm right to left midline shift. MRI (+)mass overlying the right frontalconvexity, most consistent with a meningioma. pt R frontal craniotomy with gross total excision of meningioma 5/10 . 5/11 removal tick form R thigh PMH: HTN DM2, CKD, OSA R foot fx 2012   OT comments  Pt dressed this session in prep for pending d/c after lunch. Lunch arrived during session. Pt able to locate all clothing and don sitting<>stand at EOB. Pt encouraged to use RW correctly to prevent falls at home. Pt expressed understanding.    Follow Up Recommendations  Outpatient OT    Equipment Recommendations  None recommended by OT    Recommendations for Other Services      Precautions / Restrictions Precautions Precautions: Fall Precaution Comments: hx of fall (2 in 6 months)       Mobility Bed Mobility Overal bed mobility: Needs Assistance Bed Mobility: Supine to Sit;Sit to Supine     Supine to sit: Supervision Sit to supine: Supervision   General bed mobility comments: Use of momentum to elevate trunk to get to EOB. No assist needed, HOB flat to simulate home.  Transfers Overall transfer level: Needs assistance   Transfers: Sit to/from Stand Sit to Stand: Min guard         General transfer comment: reaching from bed to chair and chair to wall to move about the room and pushed RW out of hte way x2 during session    Balance Overall balance assessment: Needs assistance         Standing balance support: Single extremity supported;During functional activity Standing balance-Leahy Scale: Fair                             ADL either performed or assessed  with clinical judgement   ADL Overall ADL's : Needs assistance/impaired Eating/Feeding: Independent Eating/Feeding Details (indicate cue type and reason): excited to eat his favorite food spaghetti             Upper Body Dressing : Modified independent   Lower Body Dressing: Modified independent Lower Body Dressing Details (indicate cue type and reason): don shoes pre tied Toilet Transfer: Min guard           Functional mobility during ADLs: Min guard General ADL Comments: pt abandoned RW and walking wihtout it using furni ture in the room. pt educate don safety to decr fall risk     Vision       Perception     Praxis      Cognition Arousal/Alertness: Awake/alert Behavior During Therapy: WFL for tasks assessed/performed Overall Cognitive Status: Within Functional Limits for tasks assessed                                          Exercises     Shoulder Instructions       General Comments      Pertinent Vitals/ Pain       Pain Assessment: No/denies pain  Home Living Family/patient expects to be discharged to:: Private residence Living Arrangements:  Spouse/significant other                                      Prior Functioning/Environment              Frequency  Min 2X/week        Progress Toward Goals  OT Goals(current goals can now be found in the care plan section)  Progress towards OT goals: Progressing toward goals  Acute Rehab OT Goals Patient Stated Goal: to go home OT Goal Formulation: With patient Time For Goal Achievement: 01/14/20 Potential to Achieve Goals: Good ADL Goals Pt Will Perform Lower Body Dressing: with supervision;sit to/from stand Pt Will Perform Tub/Shower Transfer: with supervision;ambulating Additional ADL Goal #1: pt will complete bed mobility mod I with hob < 30 degrees to simulate home  Plan Discharge plan remains appropriate    Co-evaluation                  AM-PAC OT "6 Clicks" Daily Activity     Outcome Measure   Help from another person eating meals?: A Little Help from another person taking care of personal grooming?: A Little Help from another person toileting, which includes using toliet, bedpan, or urinal?: A Little Help from another person bathing (including washing, rinsing, drying)?: A Little Help from another person to put on and taking off regular upper body clothing?: A Little Help from another person to put on and taking off regular lower body clothing?: A Little 6 Click Score: 18    End of Session    OT Visit Diagnosis: Unsteadiness on feet (R26.81);Muscle weakness (generalized) (M62.81)   Activity Tolerance Patient tolerated treatment well   Patient Left in chair;with call bell/phone within reach;with chair alarm set   Nurse Communication Mobility status;Precautions        Time: [id number] OT Time Calculation (min): 13 min  Charges: OT General Charges $OT Visit: 1 Visit OT Treatments $Self Care/Home Management : 8-22 mins   Brynn, OTR/L  Acute Rehabilitation Services Pager: [phone] Office: [phone] .    Jeri Modena 01/02/2020, 2:53 PM

## 2020-01-02 NOTE — Progress Notes (Signed)
Physical Therapy Treatment Patient Details Name: Jeffery Reed MRN: [id number] DOB: 1945-10-17 Today's Date: 01/02/2020    History of Present Illness 74 yo male s/p fall at home with confusion. CT revealed  6 x 2.7 x 4.5 cm partially calcified hyperdense mass in the right frontal lobe with associated mass-effect and 1.1 cm right to left midline shift. MRI (+)mass overlying the right frontalconvexity, most consistent with a meningioma. pt R frontal craniotomy with gross total excision of meningioma 5/10 . 5/11 removal tick form R thigh PMH: HTN DM2, CKD, OSA R foot fx 2012    PT Comments    Patient progressing well towards PT goals. Tolerated bed mobility with HOB flat to simulate home. Improved ambulation distance with supervision for safety; steady with RW and no LOB with turns. Reports he feels comfortable with stair negotiation, which he practiced yesterday.  Pt eager to return home today. Will follow in OPPT to maximize strength, mobility, balance and overall safety.    Follow Up Recommendations  Outpatient PT;Supervision/Assistance - 24 hour     Equipment Recommendations  None recommended by PT    Recommendations for Other Services       Precautions / Restrictions Precautions Precautions: Fall Precaution Comments: hx of fall (2 in 6 months) Restrictions Weight Bearing Restrictions: No    Mobility  Bed Mobility Overal bed mobility: Needs Assistance Bed Mobility: Supine to Sit;Sit to Supine     Supine to sit: Supervision Sit to supine: Supervision   General bed mobility comments: Use of momentum to elevate trunk to get to EOB. No assist needed, HOB flat to simulate home.  Transfers Overall transfer level: Needs assistance Equipment used: Rolling walker (2 wheeled) Transfers: Sit to/from Stand Sit to Stand: Min guard         General transfer comment: Min guard for safety. Stood from [employer].  Ambulation/Gait Ambulation/Gait assistance: Supervision Gait  Distance (Feet): 240 Feet Assistive device: Rolling walker (2 wheeled) Gait Pattern/deviations: Step-through pattern;Decreased stride length Gait velocity: decreased Gait velocity interpretation: <1.31 ft/sec, indicative of household ambulator General Gait Details: Slow, steady gait with RW, no LOB with turns.   Stairs Stairs: (Reports feeling good about stairs- done yesterday)           Wheelchair Mobility    Modified Rankin (Stroke Patients Only)       Balance Overall balance assessment: Needs assistance Sitting-balance support: No upper extremity supported;Feet supported Sitting balance-Leahy Scale: Good     Standing balance support: During functional activity Standing balance-Leahy Scale: Fair Standing balance comment: static standing without UE support, but feels better with UE support for ambulation with improved safety/independence                            Cognition Arousal/Alertness: Awake/alert Behavior During Therapy: WFL for tasks assessed/performed Overall Cognitive Status: Within Functional Limits for tasks assessed                                        Exercises      General Comments        Pertinent Vitals/Pain Faces Pain Scale: No hurt    Home Living                      Prior Function            PT  Goals (current goals can now be found in the care plan section) Progress towards PT goals: Progressing toward goals    Frequency    Min 3X/week      PT Plan Current plan remains appropriate    Co-evaluation              AM-PAC PT "6 Clicks" Mobility   Outcome Measure  Help needed turning from your back to your side while in a flat bed without using bedrails?: None Help needed moving from lying on your back to sitting on the side of a flat bed without using bedrails?: None Help needed moving to and from a bed to a chair (including a wheelchair)?: A Little Help needed standing up from a  chair using your arms (e.g., wheelchair or bedside chair)?: A Little Help needed to walk in hospital room?: None Help needed climbing 3-5 steps with a railing? : A Little 6 Click Score: 21    End of Session Equipment Utilized During Treatment: Gait belt Activity Tolerance: Patient tolerated treatment well Patient left: in bed;with call bell/phone within reach;with bed alarm set;with SCD's reapplied Nurse Communication: Mobility status PT Visit Diagnosis: Other abnormalities of gait and mobility (R26.89);Repeated falls (R29.6);Other symptoms and signs involving the nervous system (R29.898)     Time: [id number] PT Time Calculation (min) (ACUTE ONLY): 22 min  Charges:  $Gait Training: 8-22 mins                     Marisa Severin, PT, DPT Acute Rehabilitation Services Pager [phone] Office [phone]       [place] 01/02/2020, 10:21 AM

## 2020-01-02 NOTE — Discharge Summary (Addendum)
Discharge Summary  Jeffery Reed [id number] DOB: [birth year]  PCP: Unk Pinto, MD  Admit date: 12/28/2019 Discharge date: 01/02/2020  Time spent: 35 minutes  Recommendations for Outpatient Follow-up:  1. Follow up with neurosurgery within a week 2. Follow up with radiation oncology in 1-2 weeks 3. Follow up with your PCP within 1 week 4. Take your medications as prescribed 5. Do not drive or operate heavy machinery for at least 6 months or until cleared by your neurosurgeon.  6. Do not swim, or take baths alone. 7. Continue with seizures precautions. 8. Continue PT OT with assistance and fall precautions.  Discharge Diagnoses:  Active Hospital Problems   Diagnosis Date Noted  . Brain mass   . Noncompliance with CPAP treatment   . Stage 3 chronic kidney disease   . Steroid-induced hyperglycemia   . Prediabetes   . Enlarged prostate   . Tachypnea   . Frontal mass of brain 12/28/2019    Resolved Hospital Problems  No resolved problems to display.    Discharge Condition: Stable  Diet recommendation: Resume previous diet  Vitals:   01/02/20 0400 01/02/20 0750  BP: 120/66 (!) 144/73  Pulse: (!) 45 60  Resp: 15 20  Temp: 98 F (36.7 C) 98.4 F (36.9 C)  SpO2: 93% 96%    History of present illness:  [name] is a 74 yo CM with PMH HTN, DMII, CKD, OSA who presented to the ER after a fall at home and confusion. It was unknown if he syncopized and fell vs mechanical fall and hit head; patient unable to recall events.The last thing he remembers is doing some yard work earlier today. His daughter is present at bedside in the ER who helps provide further collateral information. She states that they have noticed a very slow gradual cognitive change since December.   Over the past 2 weeks he has been more forgetful and has been complaining of a daily headache for the past 1 week requiring taking 1 Tylenol arthritis daily which relieves his headache. There was  some consideration of dementia however no formal work-up had been commenced.  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 He has only started over the past couple weeks. He does not have a significant fall history at home but does endorse that his left thigh has been more sore recently for unknown reasons. He has been evaluated outpatient by Ortho with plans for an MRI which has not taken place as of yet. He has also been participating in physical therapy with no improvement. Other than his headaches, he denies any vision changes or double vision. He denies any focal weakness nor any numbness or tingling. His balance is normal and appropriate. He did fracture his right footseveral years ago(2012)which he says does affect his balance at times.  Due to his altered mental status and fall, he underwent CT head upon arrival to the ER which revealed a large 6 x 2.7 x 4.5 cm partially calcified hyperdense mass in the right frontal lobe with associated mass-effect and 1.1 cm right to left midline shift. He then underwent MRI brainrevealing: "7.0 x 2.9 x 3.1 cm extra axial mass overlying the right frontal convexity, most consistent with a meningioma. Associated mass effect with vasogenic edema within the underlying right cerebral hemisphere with up to 8 mm of right-to-left shift."  NSG was consulted from the ER to evaluate as well. Patient received10 mg IV Decadron for vasogenic edema  seen on brain imaging.  Post right frontal craniotomy and gross total excision of meningioma on 12/29/19 by Dr. Ellene Route, neurosurgery.  01/02/20: Seen and examined.  No acute events overnight.  POD #4 post gross total excision of right frontal lobe meningioma.  He denies any headaches, change in vision, or dizziness.  Vital signs are stable.  No acute issues, no complaints at this time.  He is eager to go home.    Hospital Course:  Active Problems:   Frontal mass of  brain   Brain mass   Noncompliance with CPAP treatment   Stage 3 chronic kidney disease   Steroid-induced hyperglycemia   Prediabetes   Enlarged prostate   Tachypnea  Right frontal lobe mass post R frontal craniotomy 12/29/19 by Dr. Ellene Route - CT head and MRI brain in ER. MRI reveals 7 x 2.9 x 3.1 cm mass in right frontal lobe with vasogenic edema and R to L midline shift, 8 mm - recent HA likely related to mass; wife mentioned "staring spells" so will need close monitoring for seizure.  Post gross total excision as stated above. -Continue po decadron 2 mg twice daily (this will be weaned by neurosurgery) -Continue po Keppra 500 mg twice daily for at least 6 months, will be monitored by neurosurgery. -Continue Keflex 500 mg TID x 10 days prophylactically due to frontal sinus entry- Do not blow your nose (patient advised), per neurosurgery. -Received IV cefazolin inpatient. -Follow up with neurosurgery within a week for removal of your staples -Do not drive or operate heavy machinery for at least 6 months or until cleared by your neurosurgeon.  Do not swim, or take baths alone. Continue with seizures precautions.  Newly diagnosed right frontal lobe brain Meningioma As evidenced by pathology result Follow up with Radiation oncology, Dr. Mickeal Skinner.   Type 2 diabetes with hyperglycemia exacerbated by steroids A1c 8.1 on 12/29/2019 Received insulin inpatient Decadron dose reduced to 2 mg BID, tappered by neurosurgery Resume home regimen Follow up with your PCP within a week  Essential hypertension BP stable Continue home antihypertensive valsartan/HCTZ Follow up with your PCP  Resolved Leukocytosis likely reactive in the setting of IV steroids use No evidence of active infective process Received IV cefazolin prophylactically post neurosurgery with frontal sinus entry Currently afebrile with no leukocytosis  AKI likely prerenal in the setting of dehydration Received IV fluids,  fluid discontinued as patient had good oral intake and not volume depleted. Back to baseline with creatinine 1.2 with GFR greater than 58. Continue to avoid nephrotoxins Follow up with your PCP  OSA Continue CPAP at night, he has been compliant.  BPH Continue home regimen  GERD Stable Continue home regimen   Code Status:Full    Procedures: Gross total excision of right frontal lobe meningioma 12/29/19.  Consultations:  Neurosurgery  Discharge Exam: BP (!) 144/73 (BP Location: Left Arm)   Pulse 60   Temp 98.4 F (36.9 C) (Oral)   Resp 20   Ht 5\' 10"  (1.778 m)   Wt 102.1 kg   SpO2 96%   BMI 32.28 kg/m  . General: 74 y.o. year-old male well developed well nourished in no acute distress.  Alert and oriented x3. . Cardiovascular: Regular rate and rhythm with no rubs or gallops.  No thyromegaly or JVD noted.   Marland Kitchen Respiratory: Clear to auscultation with no wheezes or rales. Good inspiratory effort. . Abdomen: Soft nontender nondistended with normal bowel sounds x4 quadrants. . Musculoskeletal: No lower extremity edema. 2/4 pulses  in all 4 extremities. . Skin: Scalp staples in place, lesion is clean and dry. Marland Kitchen Psychiatry: Mood is appropriate for condition and setting  Discharge Instructions You were cared for by a hospitalist during your hospital stay. If you have any questions about your discharge medications or the care you received while you were in the hospital after you are discharged, you can call the unit and asked to speak with the hospitalist on call if the hospitalist that took care of you is not available. Once you are discharged, your primary care physician will handle any further medical issues. Please note that NO REFILLS for any discharge medications will be authorized once you are discharged, as it is imperative that you return to your primary care physician (or establish a relationship with a primary care physician if you do not have one) for your  aftercare needs so that they can reassess your need for medications and monitor your lab values.   Allergies as of 01/02/2020      Reactions   Cardura [doxazosin Mesylate] Other (See Comments)   Nasal congestion   Codeine Other (See Comments)   The patient passed out   Hytrin [terazosin] Other (See Comments)   Reaction not recalled      Medication List    STOP taking these medications   ALPRAZolam 1 MG tablet Commonly known as: Company secretary     TAKE these medications   acetaminophen 650 MG CR tablet Commonly known as: TYLENOL Take 650 mg by mouth every 8 (eight) hours as needed for pain (or headaches).   cephALEXin 500 MG capsule Commonly known as: KEFLEX Take 1 capsule (500 mg total) by mouth 3 (three) times daily for 10 days.   dexamethasone 2 MG tablet Commonly known as: DECADRON Take 1 tablet (2 mg total) by mouth every 12 (twelve) hours for 10 days.   fenofibrate 160 MG tablet TAKE 1 TABLET BY MOUTH  DAILY FOR BLOOD FATS What changed:   how much to take  how to take this  when to take this  additional instructions   fexofenadine 180 MG tablet Commonly known as: ALLEGRA Take 180 mg by mouth daily as needed for allergies or rhinitis.   fluticasone 50 MCG/ACT nasal spray Commonly known as: FLONASE Use 2 sprays each nostril  /daily What changed:   how much to take  how to take this  when to take this  reasons to take this  additional instructions   glipiZIDE 5 MG tablet Commonly known as: GLUCOTROL Take 1 tablet 2 x   /day with Meals for Diabetes What changed:   how much to take  how to take this  when to take this  additional instructions   glucose blood test strip Commonly known as: FREESTYLE LITE Check blood sugar 1 time daily-[id number]   levETIRAcetam 500 MG tablet Commonly known as: KEPPRA Take 1 tablet (500 mg total) by mouth 2 (two) times daily.   metFORMIN 500 MG 24 hr tablet Commonly known  as: GLUCOPHAGE-XR Take 2 tablets a day with food, decrease due to kidney function What changed:   how much to take  how to take this  when to take this  additional instructions   mupirocin ointment 2 % Commonly known as: BACTROBAN APPLY TWICE A DAY TO SKIN INFECTION. What changed: See the new instructions.   omeprazole 40 MG capsule Commonly known as: PRILOSEC TAKE 1 CAPSULE BY MOUTH  DAILY FOR ACID REFLUX What  changed:   how much to take  how to take this  when to take this  additional instructions   oxybutynin 5 MG tablet Commonly known as: DITROPAN Take 1 tablet 3 x /day  For Bladder Control What changed:   how much to take  how to take this  when to take this  additional instructions   polyethylene glycol 17 g packet Commonly known as: MIRALAX / GLYCOLAX Take 17 g by mouth daily as needed for mild constipation.   rosuvastatin 10 MG tablet Commonly known as: CRESTOR Take 10 mg by mouth at bedtime.   tamsulosin 0.4 MG Caps capsule Commonly known as: FLOMAX Take 1 capsule (0.4 mg total) by mouth daily.   valsartan-hydrochlorothiazide 320-25 MG tablet Commonly known as: DIOVAN-HCT Take 1 tablet Daily for BP What changed:   how much to take  how to take this  when to take this  additional instructions   Vitamin D3 250 MCG (10000 UT) capsule Take 10,000 Units by mouth at bedtime.      Allergies  Allergen Reactions  . Cardura [Doxazosin Mesylate] Other (See Comments)    Nasal congestion  . Codeine Other (See Comments)    The patient passed out  . Hytrin [Terazosin] Other (See Comments)    Reaction not recalled   Follow-up Information    Unk Pinto, MD. Call in 1 day(s).   Specialty: Internal Medicine Why: Please call for a post hospital follow-up appointment. Contact information: [id number] [place] [address] [phone]        Ventura Sellers, MD. Call in 1 day(s).   Specialties: Psychiatry,  Neurology, Oncology Why: Please call for a post hospital follow-up appointment. Contact information: [place] [address] [phone]        Kristeen Miss, MD. Call in 1 day(s).   Specialty: Neurosurgery Why: Please call for a post hospital follow-up appointment. Contact information: [address] [place] [address] [phone]        [facility] Follow up.   Specialty: Rehabilitation Why: referral made for outpt PT/OT- they will contact you or you may contact them regarding scheduling appointments if you haven't heard from them by next week Contact information: [address] [place] [phone]           The results of significant diagnostics from this hospitalization (including imaging, microbiology, ancillary and laboratory) are listed below for reference.    Significant Diagnostic Studies: CT HEAD WO CONTRAST  Result Date: 12/31/2019 CLINICAL DATA:  Meningioma resection EXAM: CT HEAD WITHOUT CONTRAST TECHNIQUE: Contiguous axial images were obtained from the base of the skull through the vertex without intravenous contrast. COMPARISON:  Head CT 12/28/2019 FINDINGS: Brain: Status post resection of extra-axial tumor along the right frontal convexity. There is extra-axial and intraparenchymal blood at the resection site. Intermediate area of hypoattenuation within the anterior right frontal lobe. Extra-axial collection over the anterior right convexity measures 8 mm. There is unchanged leftward midline shift of 5 mm. Vascular: No hyperdense vessel or unexpected calcification. Skull: Right frontoparietal craniotomy Sinuses/Orbits: Fluid in the frontal and anterior ethmoid sinuses. Other: None. IMPRESSION: 1. Status post resection of extra-axial tumor along the right frontal convexity. 2. Extra-axial and intraparenchymal blood at the resection  site with unchanged leftward midline shift of 5 mm. Electronically Signed   By: Ulyses Jarred M.D.   On: 12/31/2019 02:51   CT HEAD WO CONTRAST  Result  Date: 12/28/2019 CLINICAL DATA:  Found on the floor. EXAM: CT HEAD WITHOUT CONTRAST TECHNIQUE: Contiguous axial images were obtained from the base of the skull through the vertex without intravenous contrast. COMPARISON:  September 23, 2008 FINDINGS: Brain: No evidence of acute infarction, hemorrhage or hydrocephalus. A 6.0 cm x 2.7 cm x 4.5 cm partially calcified heterogeneous, mildly hyperdense mass is seen within the right frontal lobe. This represents a new finding when compared to the prior study. There is associated mass effect on the adjacent sulci with 1.1 cm right to left midline shift. A moderate amount of adjacent white matter low attenuation is seen within the right frontal lobe Vascular: No hyperdense vessels are identified. Skull: Normal. Negative for fracture or focal lesion. Sinuses/Orbits: No acute finding. Other: None. IMPRESSION: Large, 6.0 cm x 2.7 cm x 4.5 cm partially calcified heterogeneous, mildly hyperdense mass within the right frontal lobe with associated mass effect on the adjacent sulci and 1.1 cm right to left midline shift. This is new when compared to the prior study. While this may represent a large meningioma, MRI correlation is recommended. Electronically Signed   By: Virgina Norfolk M.D.   On: 12/28/2019 18:09   CT CERVICAL SPINE WO CONTRAST  Result Date: 12/28/2019 CLINICAL DATA:  Found on floor. EXAM: CT CERVICAL SPINE WITHOUT CONTRAST TECHNIQUE: Multidetector CT imaging of the cervical spine was performed without intravenous contrast. Multiplanar CT image reconstructions were also generated. COMPARISON:  None. FINDINGS: Alignment: Normal. Skull base and vertebrae: No acute fracture. No primary bone lesion or focal pathologic process. Soft tissues and spinal canal: No prevertebral fluid or swelling. No visible canal  hematoma. Disc levels: Moderate severity endplate sclerosis is seen at the levels of C4-C5, C5-C6 and C6-C7. Mild to moderate severity intervertebral disc space narrowing is also seen at these levels. There is mild bilateral multilevel facet joint hypertrophy. Upper chest: Negative. Other: None. IMPRESSION: 1. No acute osseous abnormality of the cervical spine. 2. Moderate severity multilevel degenerative changes, most prominent at the levels of C4-C5, C5-C6 and C6-C7. Electronically Signed   By: Virgina Norfolk M.D.   On: 12/28/2019 18:13   MR Brain W and Wo Contrast  Result Date: 12/28/2019 CLINICAL DATA:  Initial evaluation for brain mass. EXAM: MRI HEAD WITHOUT AND WITH CONTRAST TECHNIQUE: Multiplanar, multiecho pulse sequences of the brain and surrounding structures were obtained without and with intravenous contrast. CONTRAST:  38mL GADAVIST GADOBUTROL 1 MMOL/ML IV SOLN COMPARISON:  Prior head CT from earlier the same day. FINDINGS: Brain: Generalized age-related cerebral atrophy. Mild scattered T2/FLAIR hyperintensities involving the periventricular deep white matter both cerebral hemispheres most consistent with chronic small vessel ischemic disease, mild in nature. No evidence for acute or subacute infarct. No encephalomalacia to suggest chronic cortical infarction. No foci of susceptibility artifact to suggest acute or chronic intracranial hemorrhage. Large extra axial mass overlying the anterior right frontal convexity measures approximately 7.0 x 2.9 x 3.1 cm (AP by transverse by craniocaudad), most consistent with a meningioma. Lesion demonstrates fairly homogeneous postcontrast enhancement. Dural thickening and enhancement seen overlying the adjacent right frontal convexity. Anteromedial aspect of the lesion abuts the anterior aspect of the falx and possibly superior sagittal sinus. Associated mass effect with vasogenic edema within the underlying right cerebral hemisphere. Right lateral ventricle  partially effaced. Associated right-to-left shift measures up to 8 mm. No hydrocephalus or ventricular trapping at this time. Basilar cisterns remain patent. No other mass lesion or abnormal enhancement. No extra-axial fluid collection. Pituitary gland suprasellar  region normal. Vascular: Major intracranial vascular flow voids are maintained. Skull and upper cervical spine: Craniocervical junction within normal limits. Bone marrow signal intensity normal. No scalp soft tissue abnormality. Sinuses/Orbits: Globes and orbital soft tissues within normal limits. Mild mucosal thickening noted within the ethmoidal air cells. Paranasal sinuses are otherwise clear. Small bilateral mastoid effusions noted, of doubtful significance. Visualized nasopharynx within normal limits. Other: None. IMPRESSION: 1. 7.0 x 2.9 x 3.1 cm extra axial mass overlying the right frontal convexity, most consistent with a meningioma. Associated mass effect with vasogenic edema within the underlying right cerebral hemisphere with up to 8 mm of right-to-left shift. 2. Underlying mild for age chronic microvascular ischemic disease. Electronically Signed   By: Jeannine Boga M.D.   On: 12/28/2019 20:39   US RENAL  Result Date: 12/15/2019 CLINICAL DATA:  Chronic kidney disease EXAM: RENAL / URINARY TRACT ULTRASOUND COMPLETE COMPARISON:  None. FINDINGS: Right Kidney: Renal measurements: 10.7 x 5.5 x 6 cm = volume: 184.3 mL. Cortical echogenicity is normal. No hydronephrosis. 2.2 x 2.7 x 2.4 cm midpole hypoechoic lesion. Left Kidney: Renal measurements: 11.4 x 5.1 x 5.5 cm = volume: 169.2 mL. Echogenicity within normal limits. No mass or hydronephrosis visualized. Bladder: Appears normal for degree of bladder distention. Other: Prostate appears enlarged with mass effect on the bladder. IMPRESSION: 1. Negative for hydronephrosis. 2. 2.7 cm hypoechoic lesion mid to lower right kidney suggesting complex cyst. Further evaluation with renal CT or MRI  could be obtained if desired, otherwise suggest sonographic follow-up in 6 months to evaluate for change or stability. Electronically Signed   By: Donavan Foil M.D.   On: 12/15/2019 16:52   XR HIP UNILAT W OR W/O PELVIS 1V LEFT  Result Date: 12/17/2019 AP pelvis lateral view of the left femur: Films are low AP pelvis includes much of the femur as possible.  There is no pathology the femur.  There is artifact due to patient's overlying clothing proximal shaft seen mostly on the lateral view.  Hips well located.  Hips also well preserved.  No acute fracture   Microbiology: Recent Results (from the past 240 hour(s))  Respiratory Panel by RT PCR (Flu A&B, Covid) - Urine, Clean Catch     Status: None   Collection Time: 12/28/19  9:32 PM   Specimen: Urine, Clean Catch  Result Value Ref Range Status   SARS Coronavirus 2 by RT PCR NEGATIVE NEGATIVE Final    Comment: (NOTE) SARS-CoV-2 target nucleic acids are NOT DETECTED. The SARS-CoV-2 RNA is generally detectable in upper respiratoy specimens during the acute phase of infection. The lowest concentration of SARS-CoV-2 viral copies this assay can detect is 131 copies/mL. A negative result does not preclude SARS-Cov-2 infection and should not be used as the sole basis for treatment or other patient management decisions. A negative result may occur with  improper specimen collection/handling, submission of specimen other than nasopharyngeal swab, presence of viral mutation(s) within the areas targeted by this assay, and inadequate number of viral copies (<131 copies/mL). A negative result must be combined with clinical observations, patient history, and epidemiological information. The expected result is Negative. Fact Sheet for Patients:  PinkCheek.be Fact Sheet for Healthcare Providers:  GravelBags.it This test is not yet ap proved or cleared by the [place] FDA and  has been  authorized for detection and/or diagnosis of SARS-CoV-2 by FDA under an Emergency Use Authorization (EUA). This EUA will remain  in effect (meaning this test can be used) for the  duration of the COVID-19 declaration under Section 564(b)(1) of the Act, 21 U.S.C. section 360bbb-3(b)(1), unless the authorization is terminated or revoked sooner.    Influenza A by PCR NEGATIVE NEGATIVE Final   Influenza B by PCR NEGATIVE NEGATIVE Final    Comment: (NOTE) The Xpert Xpress SARS-CoV-2/FLU/RSV assay is intended as an aid in  the diagnosis of influenza from Nasopharyngeal swab specimens and  should not be used as a sole basis for treatment. Nasal washings and  aspirates are unacceptable for Xpert Xpress SARS-CoV-2/FLU/RSV  testing. Fact Sheet for Patients: PinkCheek.be Fact Sheet for Healthcare Providers: GravelBags.it This test is not yet approved or cleared by the [place] FDA and  has been authorized for detection and/or diagnosis of SARS-CoV-2 by  FDA under an Emergency Use Authorization (EUA). This EUA will remain  in effect (meaning this test can be used) for the duration of the  Covid-19 declaration under Section 564(b)(1) of the Act, 21  U.S.C. section 360bbb-3(b)(1), unless the authorization is  terminated or revoked. Performed at [facility], [place] [address] [place], [place] [address]   MRSA PCR Screening     Status: None   Collection Time: 12/29/19 10:40 PM   Specimen: Nasal Mucosa; Nasopharyngeal  Result Value Ref Range Status   MRSA by PCR NEGATIVE NEGATIVE Final    Comment:        The GeneXpert MRSA Assay (FDA approved for NASAL specimens only), is one component of a comprehensive MRSA colonization surveillance program. It is not intended to diagnose MRSA infection nor to guide or monitor treatment for MRSA infections. Performed at [facility], [place] [address] [place], [place] [address]       Labs: Basic Metabolic Panel: Recent Labs  Lab 12/29/19 0416 12/29/19 0416 12/29/19 1753 12/29/19 1753 12/29/19 1911 12/30/19 0712 12/31/19 0444 01/01/20 0637 01/02/20 0601  NA 139   < > 141   < > 142 144 141 141 140  K 3.9   < > 3.8   < > 4.2 4.0 4.2 4.1 4.0  CL 104   < > 105  --   --  110 110 110 107  CO2 24  --   --   --   --  23 24 24 24   GLUCOSE 347*   < > 243*  --   --  261* 266* 292* 217*  BUN 20   < > 20  --   --  18 16 15 22   CREATININE 1.59*   < > 1.40*  --   --  1.48* 1.26* 1.14 1.24  CALCIUM 9.2  --   --   --   --  8.1* 8.3* 8.1* 8.6*  MG 1.9  --   --   --   --   --   --   --   --    < > = values in this interval not displayed.   Liver Function Tests: Recent Labs  Lab 12/28/19 1812  AST 29  ALT 17  ALKPHOS 48  BILITOT 0.9  PROT 6.3*  ALBUMIN 3.7   No results for input(s): LIPASE, AMYLASE in the last 168 hours. No results for input(s): AMMONIA in the last 168 hours. CBC: Recent Labs  Lab 12/29/19 0416 12/29/19 1753 12/29/19 1911 12/30/19 0712 12/31/19 0444 01/01/20 0637 01/02/20 0601  WBC 9.1  --   --  15.4* 10.3 7.3 6.4  HGB 13.7   < > 10.9* 11.9* 10.7* 9.6* 9.9*  HCT  40.5   < > 32.0* 35.7* 32.3* 28.7* 30.5*  MCV 85.8  --   --  89.3 90.5 88.3 89.7  PLT 173  --   --  142* 113* 134* 129*   < > = values in this interval not displayed.   Cardiac Enzymes: No results for input(s): CKTOTAL, CKMB, CKMBINDEX, TROPONINI in the last 168 hours. BNP: BNP (last 3 results) No results for input(s): BNP in the last 8760 hours.  ProBNP (last 3 results) No results for input(s): PROBNP in the last 8760 hours.  CBG: Recent Labs  Lab 01/01/20 0851 01/01/20 1151 01/01/20 1556 01/01/20 2118 01/02/20 0747  GLUCAP 296* 268* 235* 168* 165*       Signed:  Kayleen Memos, MD [facility] 01/02/2020, 11:35 AM

## 2020-01-05 ENCOUNTER — Telehealth: Payer: Self-pay | Admitting: Internal Medicine

## 2020-01-05 ENCOUNTER — Other Ambulatory Visit: Payer: Self-pay | Admitting: Radiation Therapy

## 2020-01-05 NOTE — Telephone Encounter (Signed)
Received a new pt referral from Dr. Ellene Route in the hospital to schedule an appt for Mr. Perrot to see Dr. Mickeal Skinner. I new patient appt has been scheduled to see Dr. Mickeal Skinner on 5/24 at 11:30am. Appt date and time has been given to he pt's wife. Aware to arrive 15 minutes early.

## 2020-01-07 ENCOUNTER — Other Ambulatory Visit: Payer: Self-pay | Admitting: Neurological Surgery

## 2020-01-07 ENCOUNTER — Telehealth: Payer: Self-pay | Admitting: *Deleted

## 2020-01-07 DIAGNOSIS — D320 Benign neoplasm of cerebral meninges: Secondary | ICD-10-CM

## 2020-01-07 NOTE — Telephone Encounter (Signed)
Called patient on 01/07/2020 , 10:51 AM in an attempt to reach the patient for a hospital follow up. Spoke with the spouse. The patient is at the neurosurgeons office for an appointment.   Admit date: 12/28/19 Discharge: 01/02/20   He does not have any questions or concerns about medications from the hospital admission. The patient's medications were reviewed over the phone, they were counseled to bring in all current medications to the hospital follow up visit. He is still taking an antibiotic and Dexamethasone.  I advised the patient to call if any questions or concerns arise about the hospital admission or medications    Home health was not started in the hospital.  All questions were answered and a follow up appointment was made. Patient has a hospital follow up appointment on 05/20/20221 with Vicie Mutters.  Prior to Admission medications   Medication Sig Start Date End Date Taking? Authorizing Provider  acetaminophen (TYLENOL) 650 MG CR tablet Take 650 mg by mouth every 8 (eight) hours as needed for pain (or headaches).    [provider]  cephALEXin (KEFLEX) 500 MG capsule Take 1 capsule (500 mg total) by mouth 3 (three) times daily for 10 days. 01/02/20 01/12/20  Kayleen Memos, DO  Cholecalciferol (VITAMIN D3) 10000 units capsule Take 10,000 Units by mouth at bedtime.     [provider]  dexamethasone (DECADRON) 2 MG tablet Take 1 tablet (2 mg total) by mouth every 12 (twelve) hours for 10 days. 01/02/20 01/12/20  Kayleen Memos, DO  fenofibrate 160 MG tablet TAKE 1 TABLET BY MOUTH  DAILY FOR BLOOD FATS Patient taking differently: Take 160 mg by mouth at bedtime.  06/11/19   Liane Comber, NP  fexofenadine (ALLEGRA) 180 MG tablet Take 180 mg by mouth daily as needed for allergies or rhinitis.    [provider]  fluticasone (FLONASE) 50 MCG/ACT nasal spray Use 2 sprays each nostril  /daily Patient taking differently: Place 2 sprays into both nostrils daily as  needed for allergies or rhinitis.  04/18/18   Unk Pinto, MD  glipiZIDE (GLUCOTROL) 5 MG tablet Take 1 tablet 2 x   /day with Meals for Diabetes Patient taking differently: Take 5 mg by mouth 2 (two) times daily with a meal.  12/10/19   Unk Pinto, MD  glucose blood (FREESTYLE LITE) test strip Check blood sugar 1 time daily-DX-E11.22 11/27/17   Unk Pinto, MD  levETIRAcetam (KEPPRA) 500 MG tablet Take 1 tablet (500 mg total) by mouth 2 (two) times daily. 01/02/20 06/30/20  Kayleen Memos, DO  metFORMIN (GLUCOPHAGE-XR) 500 MG 24 hr tablet Take 2 tablets a day with food, decrease due to kidney function Patient taking differently: Take 500 mg by mouth 2 (two) times daily with a meal.  07/16/19   Vicie Mutters, PA-C  mupirocin ointment (BACTROBAN) 2 % APPLY TWICE A DAY TO SKIN INFECTION. Patient taking differently: Apply 1 application topically 2 (two) times daily as needed (for cuts or abrasions- to heal).  12/19/15   Unk Pinto, MD  omeprazole (PRILOSEC) 40 MG capsule TAKE 1 CAPSULE BY MOUTH  DAILY FOR ACID REFLUX Patient taking differently: Take 40 mg by mouth at bedtime.  06/11/19   Liane Comber, NP  oxybutynin (DITROPAN) 5 MG tablet Take 1 tablet 3 x /day  For Bladder Control Patient taking differently: Take 5 mg by mouth 3 (three) times daily.  05/04/19   Unk Pinto, MD  polyethylene glycol (MIRALAX / GLYCOLAX) 17 g packet Take 17  g by mouth daily as needed for mild constipation. 01/02/20   Kayleen Memos, DO  rosuvastatin (CRESTOR) 10 MG tablet Take 10 mg by mouth at bedtime.    [provider]  tamsulosin (FLOMAX) 0.4 MG CAPS capsule Take 1 capsule (0.4 mg total) by mouth daily. 08/18/15   Unk Pinto, MD  valsartan-hydrochlorothiazide (DIOVAN-HCT) 320-25 MG tablet Take 1 tablet Daily for BP Patient taking differently: Take 1 tablet by mouth daily.  06/13/19   Unk Pinto, MD

## 2020-01-07 NOTE — Progress Notes (Signed)
Hospital follow up  Assessment and Plan: Hospital visit follow up for  Jeffery Reed was seen today for hospitalization follow-up.  Diagnoses and all orders for this visit:  Frontal mass of brain S/P surgery, doing well.   Steroid-induced hyperglycemia Check sugars 2 x daily, int he morning and once rotating later in the day, continue medications as they are but may need to adjust or add on long acting insulin Diet discussed Call if sugars are over 200   Renal mass Has not heard from nephrology Renal US 11/2019 showed right renal complex cyst, will recheck 05/2020 with renal US  Medication management -     WILL GET LABS NEXT OV IN 1 WEEK  DM (diabetes mellitus), secondary, uncontrolled, w/renal complications (HCC) Check sugars 2 x daily, int he morning and once rotating later in the day, continue medications as they are but may need to adjust or add on long acting insulin Diet discussed Call if sugars are over 200    All medications were reviewed with patient and family and fully reconciled. All questions answered fully, and patient and family members were encouraged to call the office with any further questions or concerns. Discussed goal to avoid readmission related to this diagnosis.  There are no discontinued medications.  Over 40 minutes of exam, counseling, chart review, and complex, high/moderate level critical decision making was performed this visit.   Future Appointments  Date Time Provider [place]  01/12/2020  7:00 AM [facility] BOARD CONFERENCE [facility] None  01/14/2020 11:45 AM Vicie Mutters, PA-C [facility] None  01/17/2020  4:20 PM [facility] MR 1 [facility]MRI [facility] [address] [facility]  01/20/2020  9:45 AM Mcarthur Rossetti, MD [facility] None  02/05/2020 10:00 AM [facility]  03/02/2020  3:00 PM Unk Pinto, MD [facility] None     HPI 74 y.o.male with PMH HTN, DMII, CKD, OSA  presents for follow up for transition from recent hospitalization or SNIF stay.  Admit date to the hospital was 12/28/19, patient was discharged from the hospital on 01/02/20 and our clinical staff contacted the office the day after discharge to set up a follow up appointment. The discharge summary, medications, and diagnostic test results were reviewed before meeting with the patient. The patient was admitted for:   Fall with confusion, had CT head that showed large frontal brain mass. MRI showed "7.0 x 2.9 x 3.1 cm extra axial mass overlying the right frontal convexity, most consistent with a meningioma," the area did have vasogenic edema with 8 mm right to left shift, has been on decadron which is increasing his sugars.   He underwent right frontal craniotomy and gross total excision of meningioma on 12/29/19 by Dr. Ellene Route, neurosurgery. He is on decadron 2mg  (will be weaned by neurosurgery) and keppra 500mg  BID for 6 months for seizure prevention. He is on kelfex 500mg  TID for frontal sinus.  Continue po Keppra500 mg twice daily for at least 6 months, will be monitored by neurosurgery. He had an appointment with Dr. Ellene Route yesterday, had the staples, he is starting to taper his decadron, will be off in 2 weeks.   He has a follow up with Radiation oncology, Dr. Mickeal Skinner but that is canceled and he will get an MRI in 1 week and will see if he needs to follow up.   He is type 2 DM exacerbated by steroids in the hospital, still on decadron 2mg  that will be tapered by neurosurgery.  He has Freestyle meter, not checking his sugars- insurance  would not cover continuous glucose monitor.   He is on metformin 1 pill BID and glipizide 5 mg  2 in the morning, 1 at lunch and 2 at supper.  He had AKI in the hospital but Cr went back to baseline. He has been following with Dr. Rica Records for his kidney function, had a renal US. 2.7 cm hypoechoic lesion mid to lower right kidney suggesting complex cyst, needs CT/MRI or follow up in 6 months. Prostate is enlarged and pushing on bladder, has BPH  symptoms, last PSA normal, will recheck at CPE.   Lab Results  Component Value Date   PSA 1.0 12/12/2018   PSA 0.7 10/23/2017   PSA 0.7 09/21/2016     Lab Results  Component Value Date   HGBA1C 8.1 (H) 12/29/2019   Lab Results  Component Value Date   CREATININE 1.24 01/02/2020   BUN 22 01/02/2020   NA 140 01/02/2020   K 4.0 01/02/2020   CL 107 01/02/2020   CO2 24 01/02/2020   Lab Results  Component Value Date   GFRAA >60 01/02/2020   Home health is involved.   Images while in the hospital: CT HEAD WO CONTRAST  Result Date: 12/28/2019 CLINICAL DATA:  Found on the floor. EXAM: CT HEAD WITHOUT CONTRAST TECHNIQUE: Contiguous axial images were obtained from the base of the skull through the vertex without intravenous contrast. COMPARISON:  September 23, 2008 FINDINGS: Brain: No evidence of acute infarction, hemorrhage or hydrocephalus. A 6.0 cm x 2.7 cm x 4.5 cm partially calcified heterogeneous, mildly hyperdense mass is seen within the right frontal lobe. This represents a new finding when compared to the prior study. There is associated mass effect on the adjacent sulci with 1.1 cm right to left midline shift. A moderate amount of adjacent white matter low attenuation is seen within the right frontal lobe Vascular: No hyperdense vessels are identified. Skull: Normal. Negative for fracture or focal lesion. Sinuses/Orbits: No acute finding. Other: None. IMPRESSION: Large, 6.0 cm x 2.7 cm x 4.5 cm partially calcified heterogeneous, mildly hyperdense mass within the right frontal lobe with associated mass effect on the adjacent sulci and 1.1 cm right to left midline shift. This is new when compared to the prior study. While this may represent a large meningioma, MRI correlation is recommended. Electronically Signed   By: Virgina Norfolk M.D.   On: 12/28/2019 18:09   CT CERVICAL SPINE WO CONTRAST  Result Date: 12/28/2019 CLINICAL DATA:  Found on floor. EXAM: CT CERVICAL SPINE WITHOUT  CONTRAST TECHNIQUE: Multidetector CT imaging of the cervical spine was performed without intravenous contrast. Multiplanar CT image reconstructions were also generated. COMPARISON:  None. FINDINGS: Alignment: Normal. Skull base and vertebrae: No acute fracture. No primary bone lesion or focal pathologic process. Soft tissues and spinal canal: No prevertebral fluid or swelling. No visible canal hematoma. Disc levels: Moderate severity endplate sclerosis is seen at the levels of C4-C5, C5-C6 and C6-C7. Mild to moderate severity intervertebral disc space narrowing is also seen at these levels. There is mild bilateral multilevel facet joint hypertrophy. Upper chest: Negative. Other: None. IMPRESSION: 1. No acute osseous abnormality of the cervical spine. 2. Moderate severity multilevel degenerative changes, most prominent at the levels of C4-C5, C5-C6 and C6-C7. Electronically Signed   By: Virgina Norfolk M.D.   On: 12/28/2019 18:13   MR Brain W and Wo Contrast  Result Date: 12/28/2019 CLINICAL DATA:  Initial evaluation for brain mass. EXAM: MRI HEAD WITHOUT AND WITH CONTRAST TECHNIQUE:  Multiplanar, multiecho pulse sequences of the brain and surrounding structures were obtained without and with intravenous contrast. CONTRAST:  42mL GADAVIST GADOBUTROL 1 MMOL/ML IV SOLN COMPARISON:  Prior head CT from earlier the same day. FINDINGS: Brain: Generalized age-related cerebral atrophy. Mild scattered T2/FLAIR hyperintensities involving the periventricular deep white matter both cerebral hemispheres most consistent with chronic small vessel ischemic disease, mild in nature. No evidence for acute or subacute infarct. No encephalomalacia to suggest chronic cortical infarction. No foci of susceptibility artifact to suggest acute or chronic intracranial hemorrhage. Large extra axial mass overlying the anterior right frontal convexity measures approximately 7.0 x 2.9 x 3.1 cm (AP by transverse by craniocaudad), most consistent  with a meningioma. Lesion demonstrates fairly homogeneous postcontrast enhancement. Dural thickening and enhancement seen overlying the adjacent right frontal convexity. Anteromedial aspect of the lesion abuts the anterior aspect of the falx and possibly superior sagittal sinus. Associated mass effect with vasogenic edema within the underlying right cerebral hemisphere. Right lateral ventricle partially effaced. Associated right-to-left shift measures up to 8 mm. No hydrocephalus or ventricular trapping at this time. Basilar cisterns remain patent. No other mass lesion or abnormal enhancement. No extra-axial fluid collection. Pituitary gland suprasellar region normal. Vascular: Major intracranial vascular flow voids are maintained. Skull and upper cervical spine: Craniocervical junction within normal limits. Bone marrow signal intensity normal. No scalp soft tissue abnormality. Sinuses/Orbits: Globes and orbital soft tissues within normal limits. Mild mucosal thickening noted within the ethmoidal air cells. Paranasal sinuses are otherwise clear. Small bilateral mastoid effusions noted, of doubtful significance. Visualized nasopharynx within normal limits. Other: None. IMPRESSION: 1. 7.0 x 2.9 x 3.1 cm extra axial mass overlying the right frontal convexity, most consistent with a meningioma. Associated mass effect with vasogenic edema within the underlying right cerebral hemisphere with up to 8 mm of right-to-left shift. 2. Underlying mild for age chronic microvascular ischemic disease. Electronically Signed   By: Jeannine Boga M.D.   On: 12/28/2019 20:39     Current Outpatient Medications (Endocrine & Metabolic):  .  dexamethasone (DECADRON) 2 MG tablet, Take 1 tablet (2 mg total) by mouth every 12 (twelve) hours for 10 days. Marland Kitchen  glipiZIDE (GLUCOTROL) 5 MG tablet, Take 1 tablet 2 x   /day with Meals for Diabetes (Patient taking differently: Take 5 mg by mouth 2 (two) times daily with a meal. ) .   metFORMIN (GLUCOPHAGE-XR) 500 MG 24 hr tablet, Take 2 tablets a day with food, decrease due to kidney function (Patient taking differently: Take 500 mg by mouth 2 (two) times daily with a meal. )  Current Outpatient Medications (Cardiovascular):  .  fenofibrate 160 MG tablet, TAKE 1 TABLET BY MOUTH  DAILY FOR BLOOD FATS (Patient taking differently: Take 160 mg by mouth at bedtime. ) .  rosuvastatin (CRESTOR) 10 MG tablet, Take 10 mg by mouth at bedtime. .  valsartan-hydrochlorothiazide (DIOVAN-HCT) 320-25 MG tablet, Take 1 tablet Daily for BP (Patient taking differently: Take 1 tablet by mouth daily. )  Current Outpatient Medications (Respiratory):  .  fexofenadine (ALLEGRA) 180 MG tablet, Take 180 mg by mouth daily as needed for allergies or rhinitis. .  fluticasone (FLONASE) 50 MCG/ACT nasal spray, Use 2 sprays each nostril  /daily (Patient taking differently: Place 2 sprays into both nostrils daily as needed for allergies or rhinitis. )  Current Outpatient Medications (Analgesics):  .  acetaminophen (TYLENOL) 650 MG CR tablet, Take 650 mg by mouth every 8 (eight) hours as needed for pain (or headaches).  Current Outpatient Medications (Other):  .  cephALEXin (KEFLEX) 500 MG capsule, Take 1 capsule (500 mg total) by mouth 3 (three) times daily for 10 days. .  Cholecalciferol (VITAMIN D3) 10000 units capsule, Take 10,000 Units by mouth at bedtime.  Marland Kitchen  glucose blood (FREESTYLE LITE) test strip, Check blood sugar 1 time daily-DX-E11.22 .  levETIRAcetam (KEPPRA) 500 MG tablet, Take 1 tablet (500 mg total) by mouth 2 (two) times daily. .  mupirocin ointment (BACTROBAN) 2 %, APPLY TWICE A DAY TO SKIN INFECTION. (Patient taking differently: Apply 1 application topically 2 (two) times daily as needed (for cuts or abrasions- to heal). ) .  omeprazole (PRILOSEC) 40 MG capsule, TAKE 1 CAPSULE BY MOUTH  DAILY FOR ACID REFLUX (Patient taking differently: Take 40 mg by mouth at bedtime. ) .  oxybutynin  (DITROPAN) 5 MG tablet, Take 1 tablet 3 x /day  For Bladder Control (Patient taking differently: Take 5 mg by mouth 3 (three) times daily. ) .  polyethylene glycol (MIRALAX / GLYCOLAX) 17 g packet, Take 17 g by mouth daily as needed for mild constipation. .  tamsulosin (FLOMAX) 0.4 MG CAPS capsule, Take 1 capsule (0.4 mg total) by mouth daily.  Past Medical History:  Diagnosis Date  . Allergy   . Anxiety   . Cataract    beginning stage 01/22/2019  . Chronic kidney disease    enlarge bladder  . Diabetes mellitus without complication ([place])   . GERD (gastroesophageal reflux disease)   . Gout   . Hyperlipidemia   . Hypertension   . OSA (obstructive sleep apnea)   . Pre-diabetes   . Sleep apnea    c pap     Allergies  Allergen Reactions  . Cardura [Doxazosin Mesylate] Other (See Comments)    Nasal congestion  . Codeine Other (See Comments)    The patient passed out  . Hytrin [Terazosin] Other (See Comments)    Reaction not recalled    ROS: all negative except above.   Physical Exam: Filed Weights   01/08/20 1130  Weight: 218 lb (98.9 kg)   BP 128/80   Pulse 78   Temp (!) 97.3 F (36.3 C)   Wt 218 lb (98.9 kg)   SpO2 98%   BMI 31.28 kg/m  General Appearance: Well nourished, in no apparent distress. Eyes: PERRLA, EOMs, conjunctiva no swelling or erythema ENT/Mouth: Ext aud canals clear, TMs without erythema, bulging. No erythema, swelling, or exudate on post pharynx.  Tonsils not swollen or erythematous. Hearing decreased Neck: Supple, thyroid normal.  Respiratory: Respiratory effort normal, BS equal bilaterally without rales, rhonchi, wheezing or stridor.  Cardio: RRR with no MRGs. Brisk peripheral pulses without edema.  Abdomen: Soft, + BS, obese.  Non tender, no guarding, rebound, hernias, masses. Lymphatics: Non tender without lymphadenopathy.  Musculoskeletal: Full ROM, 5/5 strength, Normal gait Skin: well healing right craniotomy incision, staples removed,  healing well without swelling, warmth, tenderness, Warm, dry without rashes, lesions, ecchymosis.  Neuro: Cranial nerves intact. No cerebellar symptoms.  Psych: Awake and oriented X 3, normal affect, Insight and Judgment appropriate.   Vicie Mutters, PA-C 12:37 PM [facility]

## 2020-01-08 ENCOUNTER — Other Ambulatory Visit: Payer: Self-pay

## 2020-01-08 ENCOUNTER — Ambulatory Visit (INDEPENDENT_AMBULATORY_CARE_PROVIDER_SITE_OTHER): Payer: Medicare Other | Admitting: Physician Assistant

## 2020-01-08 ENCOUNTER — Encounter: Payer: Self-pay | Admitting: Physician Assistant

## 2020-01-08 ENCOUNTER — Telehealth: Payer: Self-pay | Admitting: Internal Medicine

## 2020-01-08 VITALS — BP 128/80 | HR 78 | Temp 97.3°F | Wt 218.0 lb

## 2020-01-08 DIAGNOSIS — G9389 Other specified disorders of brain: Secondary | ICD-10-CM | POA: Diagnosis not present

## 2020-01-08 DIAGNOSIS — E1329 Other specified diabetes mellitus with other diabetic kidney complication: Secondary | ICD-10-CM

## 2020-01-08 DIAGNOSIS — R739 Hyperglycemia, unspecified: Secondary | ICD-10-CM | POA: Diagnosis not present

## 2020-01-08 DIAGNOSIS — E1365 Other specified diabetes mellitus with hyperglycemia: Secondary | ICD-10-CM | POA: Diagnosis not present

## 2020-01-08 DIAGNOSIS — N2889 Other specified disorders of kidney and ureter: Secondary | ICD-10-CM

## 2020-01-08 DIAGNOSIS — T380X5A Adverse effect of glucocorticoids and synthetic analogues, initial encounter: Secondary | ICD-10-CM

## 2020-01-08 DIAGNOSIS — IMO0002 Reserved for concepts with insufficient information to code with codable children: Secondary | ICD-10-CM

## 2020-01-08 DIAGNOSIS — Z79899 Other long term (current) drug therapy: Secondary | ICD-10-CM

## 2020-01-08 NOTE — Telephone Encounter (Signed)
Pt's wife called to cancel her husband's appt. They were told by Dr. Ellene Route, the appt w/Dr. Mickeal Skinner isn't needed at this time. Neuro navigator and MD updated.

## 2020-01-08 NOTE — Patient Instructions (Addendum)
The decadron will increase your sugar  Please check your sugar twice a day OR AT LEAST ONCE A DAY SUGGEST CHECKING IN THE MORNING AND ONCE LATER IN THE DAY 2 HOURS BEFORE.   Keep record and let me know in a week.   May need to add on a long acting insulin while you are on decadron.   It is important to keep your sugar low to heal  IF YOUR SUGAR IS OVER 180 IN THE MORNING OR OVER 300 AFTER FOOD CALL THE OFFICE  RANGE OF A1C   Your A1C is a measure of your sugar over the past 3 months and is not affected by what you have eaten over the past few days. Diabetes increases your chances of stroke and heart attack over 300 % and is the leading cause of blindness and kidney failure in the [place]. Please make sure you decrease bad carbs like white bread, white rice, potatoes, corn, soft drinks, pasta, cereals, refined sugars, sweet tea, dried fruits, and fruit juice. Good carbs are okay to eat in moderation like sweet potatoes, brown rice, whole grain pasta/bread, most fruit (except dried fruit) and you can eat as many veggies as you want.   Greater than 6.5 is considered diabetic. Between 6.4 and 5.7 is prediabetic If your A1C is less than 5.7 you are NOT diabetic.  Targets for Glucose Readings: Time of Check Target for patients WITHOUT Diabetes Target for DIABETICS  Before Meals Less than 100  less than 150  Two hours after meals Less than 200  Less than 250    Constipation, Adult Constipation is when a person has fewer bowel movements in a week than normal, has difficulty having a bowel movement, or has stools that are dry, hard, or larger than normal. Constipation may be caused by an underlying condition. It may become worse with age if a person takes certain medicines and does not take in enough fluids. Follow these instructions at home: Eating and drinking   Eat foods that have a lot of fiber, such as fresh fruits and vegetables, whole grains, and beans.  Limit foods that are  high in fat, low in fiber, or overly processed, such as french fries, hamburgers, cookies, candies, and soda.  Drink enough fluid to keep your urine clear or pale yellow. General instructions  Exercise regularly or as told by your health care provider.  Go to the restroom when you have the urge to go. Do not hold it in.  Take over-the-counter and prescription medicines only as told by your health care provider. These include any fiber supplements.  Practice pelvic floor retraining exercises, such as deep breathing while relaxing the lower abdomen and pelvic floor relaxation during bowel movements.  Watch your condition for any changes.  Keep all follow-up visits as told by your health care provider. This is important. Contact a health care provider if:  You have pain that gets worse.  You have a fever.  You do not have a bowel movement after 4 days.  You vomit.  You are not hungry.  You lose weight.  You are bleeding from the anus.  You have thin, pencil-like stools. Get help right away if:  You have a fever and your symptoms suddenly get worse.  You leak stool or have blood in your stool.  Your abdomen is bloated.  You have severe pain in your abdomen.  You feel dizzy or you faint. This information is not intended to replace advice given  to you by your health care provider. Make sure you discuss any questions you have with your health care provider. Document Revised: 07/20/2017 Document Reviewed: 01/26/2016 Elsevier Patient Education  2020 [employer].

## 2020-01-12 ENCOUNTER — Inpatient Hospital Stay: Payer: Medicare Other | Admitting: Internal Medicine

## 2020-01-13 NOTE — Progress Notes (Signed)
FOLLOW UP  Assessment and Plan:  Jeffery Reed was seen today for follow-up.  Diagnoses and all orders for this visit:  Steroid-induced hyperglycemia -     Ambulatory referral to [place] referral to Endocrinology -     Fructosamine Instructed in the office how to use insulin, gave self first shot here but wife is unable to help and patient is uncertain if he will be able to give shots to self and do it consistently. He is unable to drive, will try to get a nurse out to go over medications, evaluate and help with insulin demonstration review.   DM (diabetes mellitus), secondary, uncontrolled, w/renal complications ([place]) -     Ambulatory referral to [place] -     Ambulatory referral to Endocrinology -     CBC with Differential/Platelet -     COMPLETE METABOLIC PANEL WITH GFR -     TSH -     Fructosamine Check labs, will see if we can get HH out to help with patient and his wife with insulin, DM teaching and medication management.  hypoglycemia discussed ? If insulin is long term, just gave samples of tresbi long acting insulin.   Frontal mass of brain -     Ambulatory referral to Home Health Some personality change per wife since surgery- he is short tempered, no paranoia or hallucinations per wife and patient.  ? From decadron/steroid, versus keppra versus frontal lobe syndrome from surgery - will see if improves off steroids, if not may want to add low dose celexa versus switching keppra to depakote- will address with Dr. Ellene Route before any changes are made.   Medication management -     Ambulatory referral to Home Health -     TSH -     Magnesium      Over 30 minutes of exam, counseling, chart review, and complex, high/moderate level critical decision making was performed this visit.   Future Appointments  Date Time Provider [place]  01/17/2020  4:20 PM [facility] MR 1 [id number] [address] [place]  01/20/2020  9:45 AM Mcarthur Rossetti, MD [facility] None   02/05/2020 10:00 AM [facility] [place]  03/02/2020  3:00 PM Unk Pinto, MD [facility] None     HPI 74 y.o.male with PMH HTN, uncontrolled DMII, CKD, OSA  presents for follow up of his sugars. He had a hospital follow up for right frontal craniotomy and gross total excision of meningioma on 12/29/19 by Dr. Ellene Route, neurosurgery. He is weaning off decadron at this time, has 1 more week but his DM2 was exacerbated by steroids.  He has freestyle meter but his sugars, he is suppose to check his sugars 2 x a day rotating and bring in his log.   Wife is with him here today, states he has had a change in personality, states he is shorter fuse, she will try to help him with meds and he will get angery with her. Denies paranoia. He is on decadron, he is on keppra and had the frontal lobe tumor removed.   He is on metformin 1 pill BID and glipizide 5 mg  2 in the morning, 1 at lunch and 2 at supper.  His morning sugar was 330 and 202 was the lowest.   He had AKI in the hospital but Cr went back to baseline. He has been following with Dr. Rica Records for his kidney function, had a renal US. 2.7 cm hypoechoic lesion mid  to lower right kidney suggesting complex cyst, needs CT/MRI or follow up in 6 months. Prostate is enlarged and pushing on bladder, has BPH symptoms, last PSA normal, will recheck at CPE. He is on flomax and finesteride.  Lab Results  Component Value Date   PSA 1.0 12/12/2018   PSA 0.7 10/23/2017   PSA 0.7 09/21/2016    Lab Results  Component Value Date   HGBA1C 8.1 (H) 12/29/2019   Lab Results  Component Value Date   CREATININE 1.24 01/02/2020   BUN 22 01/02/2020   NA 140 01/02/2020   K 4.0 01/02/2020   CL 107 01/02/2020   CO2 24 01/02/2020   Lab Results  Component Value Date   GFRAA >60 01/02/2020      Current Outpatient Medications (Endocrine & Metabolic):  .  glipiZIDE (GLUCOTROL) 5 MG tablet, Take 1 tablet 2 x   /day with Meals for Diabetes (Patient taking  differently: Take 5 mg by mouth 2 (two) times daily with a meal. ) .  metFORMIN (GLUCOPHAGE-XR) 500 MG 24 hr tablet, Take 2 tablets a day with food, decrease due to kidney function (Patient taking differently: Take 500 mg by mouth 2 (two) times daily with a meal. )  Current Outpatient Medications (Cardiovascular):  .  fenofibrate 160 MG tablet, TAKE 1 TABLET BY MOUTH  DAILY FOR BLOOD FATS (Patient taking differently: Take 160 mg by mouth at bedtime. ) .  rosuvastatin (CRESTOR) 10 MG tablet, Take 10 mg by mouth at bedtime. .  valsartan-hydrochlorothiazide (DIOVAN-HCT) 320-25 MG tablet, Take 1 tablet Daily for BP (Patient taking differently: Take 1 tablet by mouth daily. )  Current Outpatient Medications (Respiratory):  .  fexofenadine (ALLEGRA) 180 MG tablet, Take 180 mg by mouth daily as needed for allergies or rhinitis. .  fluticasone (FLONASE) 50 MCG/ACT nasal spray, Use 2 sprays each nostril  /daily (Patient taking differently: Place 2 sprays into both nostrils daily as needed for allergies or rhinitis. )  Current Outpatient Medications (Analgesics):  .  acetaminophen (TYLENOL) 650 MG CR tablet, Take 650 mg by mouth every 8 (eight) hours as needed for pain (or headaches).   Current Outpatient Medications (Other):  Marland Kitchen  Cholecalciferol (VITAMIN D3) 10000 units capsule, Take 10,000 Units by mouth at bedtime.  Marland Kitchen  glucose blood (FREESTYLE LITE) test strip, Check blood sugar 1 time daily-DX-E11.22 .  levETIRAcetam (KEPPRA) 500 MG tablet, Take 1 tablet (500 mg total) by mouth 2 (two) times daily. .  mupirocin ointment (BACTROBAN) 2 %, APPLY TWICE A DAY TO SKIN INFECTION. (Patient taking differently: Apply 1 application topically 2 (two) times daily as needed (for cuts or abrasions- to heal). ) .  omeprazole (PRILOSEC) 40 MG capsule, TAKE 1 CAPSULE BY MOUTH  DAILY FOR ACID REFLUX (Patient taking differently: Take 40 mg by mouth at bedtime. ) .  oxybutynin (DITROPAN) 5 MG tablet, Take 1 tablet 3 x  /day  For Bladder Control (Patient taking differently: Take 5 mg by mouth 3 (three) times daily. ) .  polyethylene glycol (MIRALAX / GLYCOLAX) 17 g packet, Take 17 g by mouth daily as needed for mild constipation. .  tamsulosin (FLOMAX) 0.4 MG CAPS capsule, Take 1 capsule (0.4 mg total) by mouth daily.  Past Medical History:  Diagnosis Date  . Allergy   . Anxiety   . Cataract    beginning stage 01/22/2019  . Chronic kidney disease    enlarge bladder  . Diabetes mellitus without complication ([place])   . GERD (gastroesophageal  reflux disease)   . Gout   . Hyperlipidemia   . Hypertension   . OSA (obstructive sleep apnea)   . Pre-diabetes   . Sleep apnea    c pap     Allergies  Allergen Reactions  . Cardura [Doxazosin Mesylate] Other (See Comments)    Nasal congestion  . Codeine Other (See Comments)    The patient passed out  . Hytrin [Terazosin] Other (See Comments)    Reaction not recalled    ROS: all negative except above.   Physical Exam: Filed Weights   01/14/20 1155  Weight: 213 lb (96.6 kg)   BP 126/78   Pulse 73   Temp 97.7 F (36.5 C)   Wt 213 lb (96.6 kg)   SpO2 99%   BMI 30.56 kg/m  General Appearance: Well nourished, in no apparent distress. Eyes: PERRLA, EOMs, conjunctiva no swelling or erythema ENT/Mouth: Ext aud canals clear, TMs without erythema, bulging. No erythema, swelling, or exudate on post pharynx.  Tonsils not swollen or erythematous. Hearing decreased Neck: Supple, thyroid normal.  Respiratory: Respiratory effort normal, BS equal bilaterally without rales, rhonchi, wheezing or stridor.  Cardio: RRR with no MRGs. Brisk peripheral pulses without edema.  Abdomen: Soft, + BS, obese.  Non tender, no guarding, rebound, hernias, masses. Lymphatics: Non tender without lymphadenopathy.  Musculoskeletal: Full ROM, 5/5 strength, Normal gait Skin: well healing right craniotomy incision, staples removed, healing well without swelling, warmth,  tenderness, Warm, dry without rashes, lesions, ecchymosis.  Neuro: Cranial nerves intact. No cerebellar symptoms.  Psych: Awake and oriented X 3, normal affect, Insight and Judgment appropriate.   Vicie Mutters, PA-C 12:36 PM [facility]

## 2020-01-14 ENCOUNTER — Other Ambulatory Visit: Payer: Self-pay

## 2020-01-14 ENCOUNTER — Encounter: Payer: Self-pay | Admitting: Physician Assistant

## 2020-01-14 ENCOUNTER — Ambulatory Visit (INDEPENDENT_AMBULATORY_CARE_PROVIDER_SITE_OTHER): Payer: Medicare Other | Admitting: Physician Assistant

## 2020-01-14 VITALS — BP 126/78 | HR 73 | Temp 97.7°F | Wt 213.0 lb

## 2020-01-14 DIAGNOSIS — T380X5A Adverse effect of glucocorticoids and synthetic analogues, initial encounter: Secondary | ICD-10-CM | POA: Diagnosis not present

## 2020-01-14 DIAGNOSIS — R739 Hyperglycemia, unspecified: Secondary | ICD-10-CM | POA: Diagnosis not present

## 2020-01-14 DIAGNOSIS — Z79899 Other long term (current) drug therapy: Secondary | ICD-10-CM

## 2020-01-14 DIAGNOSIS — G9389 Other specified disorders of brain: Secondary | ICD-10-CM | POA: Diagnosis not present

## 2020-01-14 DIAGNOSIS — E1365 Other specified diabetes mellitus with hyperglycemia: Secondary | ICD-10-CM

## 2020-01-14 DIAGNOSIS — IMO0002 Reserved for concepts with insufficient information to code with codable children: Secondary | ICD-10-CM

## 2020-01-14 DIAGNOSIS — E1329 Other specified diabetes mellitus with other diabetic kidney complication: Secondary | ICD-10-CM | POA: Diagnosis not present

## 2020-01-14 MED ORDER — TRESIBA 100 UNIT/ML ~~LOC~~ SOLN: SUBCUTANEOUS | 0 refills | Status: DC

## 2020-01-14 NOTE — Patient Instructions (Addendum)
Please be careful with glipizide (generic).   This medication forces your blood sugar down no matter what it is starting at.  ONLY TAKE THIS MEDICATION WITH FOOD Do not take this medication if your sugar is below 150 IN THE MORNING  TAKE THE GLIPIZIDE 2 IN THE MORNING 1 AT LUNCH WITH FOOD AND 2 AT SUPPER   If at any time you start to have low blood sugars in the morning or during the day please stop this medication.  Please never take this medication if you are sick or can not eat.  A low blood sugar is much more dangerous than a high blood sugar. Your brain needs two things, sugar and oxygen.    We are starting you out on a Basal insulin. This insulin ONLY affects your morning SUGAR.   We will start you out on 10 units of insulin.  TAKE THIS ONCE NIGHT   This insulin provides blood sugar control for up to 24 hours.    CALL IN EVERY 3-5 DAYS AND WE LET us KNOW WHAT YOUR MORNING SUGARS ARE AND WE WILL ADJUST THE DOSE OF THE INSULIN   If blood sugar is under 90 for 2 days in a row CALL THE OFFICE.   Note that this insulin does not control the rise of blood sugar with meals    Please remember only take the insulin WITH food, if you are sick or unable to eat DO NOT take your insulin. Also a low blood sugar is much more dangerous than a high blood sugar. Your brain needs 2 things, oxygen and sugar, so lets make sure it gets both. If at any time you have a question or concern, call the office or message [place] in [place].    Your A1C is a measure of your sugar over the past 3 months and is not affected by what you have eaten over the past few days. Diabetes increases your chances of stroke and heart attack over 300 % and is the leading cause of blindness and kidney failure in the [place]. Please make sure you decrease bad carbs like white bread, white rice, potatoes, corn, soft drinks, pasta, cereals, refined sugars, sweet tea, dried fruits, and fruit juice. Good carbs are okay to eat in  moderation like sweet potatoes, brown rice, whole grain pasta/bread, most fruit (except dried fruit) and you can eat as many veggies as you want.   Greater than 6.5 is considered diabetic. Between 6.4 and 5.7 is prediabetic If your A1C is less than 5.7 you are NOT diabetic.  Targets for Glucose Readings: Time of Check Target for patients WITHOUT Diabetes Target for DIABETICS  Before Meals Less than 100  less than 150  Two hours after meals Less than 200  Less than 250    If your morning sugar is always below 120 but your A1C is still elevated then the issue is with your sugar spiking after meals. Try to take your blood sugar approximately 2 hours after eating, this number should be less than 200. If it is not, think about the foods that you ate and better choices you can make.

## 2020-01-15 ENCOUNTER — Encounter: Payer: Medicare Other | Admitting: Internal Medicine

## 2020-01-17 ENCOUNTER — Ambulatory Visit
Admission: RE | Admit: 2020-01-17 | Discharge: 2020-01-17 | Disposition: A | Discharge status: Home / Self Care | Payer: Medicare Other | Source: Ambulatory Visit | Attending: Orthopaedic Surgery | Admitting: Orthopaedic Surgery

## 2020-01-17 DIAGNOSIS — G9389 Other specified disorders of brain: Secondary | ICD-10-CM | POA: Diagnosis not present

## 2020-01-17 DIAGNOSIS — M79652 Pain in left thigh: Secondary | ICD-10-CM | POA: Diagnosis not present

## 2020-01-17 DIAGNOSIS — M25452 Effusion, left hip: Secondary | ICD-10-CM | POA: Diagnosis not present

## 2020-01-17 DIAGNOSIS — M25552 Pain in left hip: Secondary | ICD-10-CM

## 2020-01-17 IMAGING — MR MR FEMUR*L* W/O CM
5 series · 40 of 40 positions shown · non-contrast
Comparison: Radiographs dated [DATE]

CLINICAL DATA: Proximal and mid left femoral shaft pain for 1
month.

EXAM:
MR OF THE LEFT FEMUR WITHOUT CONTRAST
TECHNIQUE: Multiplanar, multisequence MR imaging of the left femur was
performed. No intravenous contrast was administered.
The scan extends from the upper pelvis through the distal femoral
shaft but does not include the femoral condyles at the knee.

[Series 4: T1 · axial · 5.0mm · 0.94mm/px · z∈[-103,+217]mm · 11 of 55 slices shown (1 of 2)]
[im 1/55]
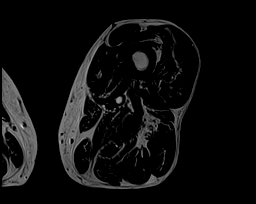
[im 6/55]
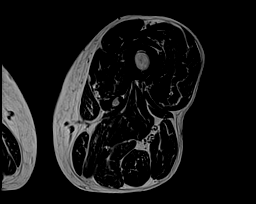
[im 11/55]
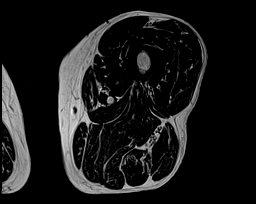
[im 17/55]
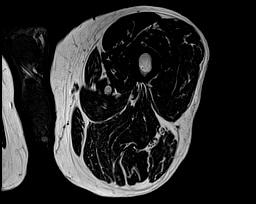
[im 22/55]
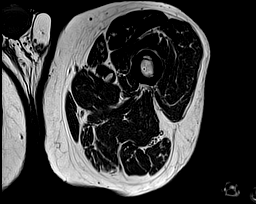
[im 28/55]
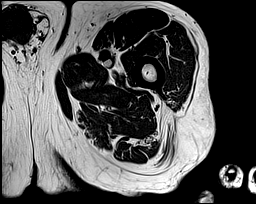
[im 33/55]
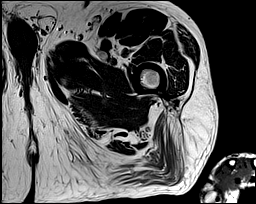
[im 38/55]
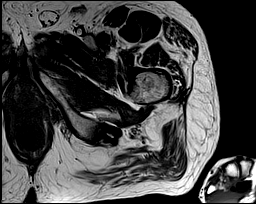
[im 44/55]
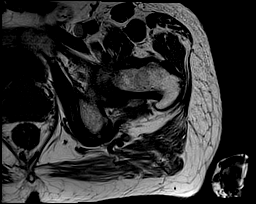
[im 49/55]
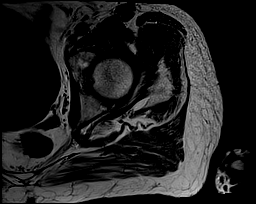
[im 55/55]
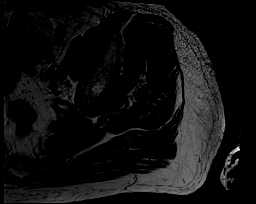

[Series 5: T2 fat-sat · axial · 5.0mm · 0.62mm/px · z∈[-103,+217]mm · 10 of 55 slices shown (1 of 2)]
[im 1/55]
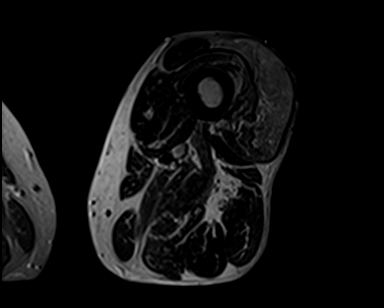
[im 7/55]
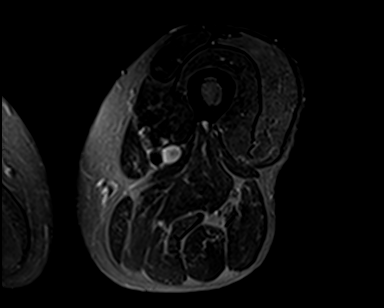
[im 13/55]
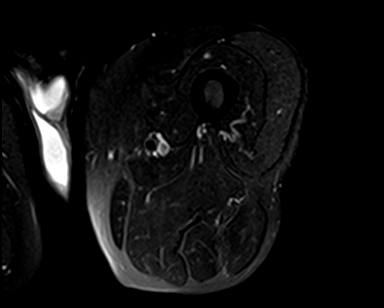
[im 19/55]
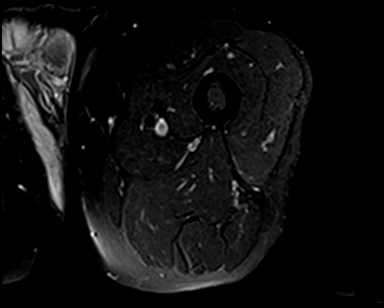
[im 25/55]
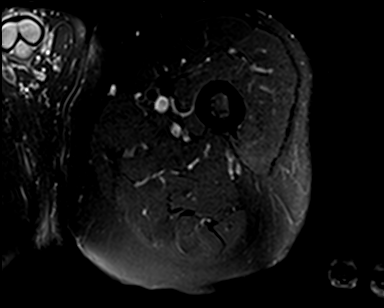
[im 31/55]
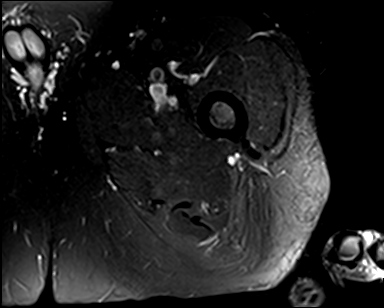
[im 37/55]
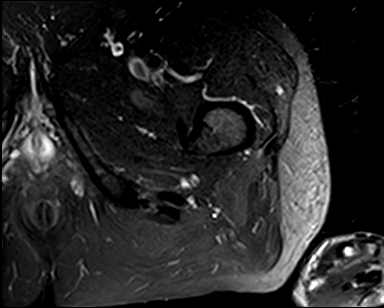
[im 43/55]
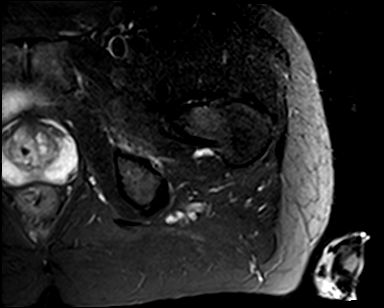
[im 49/55]
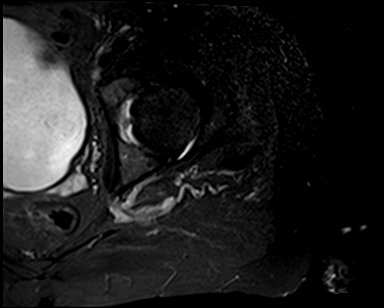
[im 55/55]
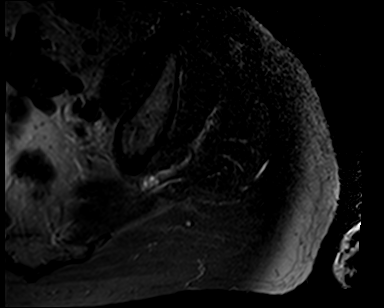

[Series 6: T1 · coronal · 4.0mm · 1.56mm/px · 7 of 40 slices shown (2 of 2)]
[im 1/40]
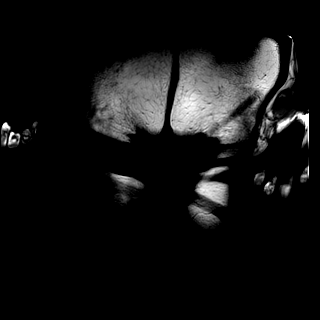
[im 7/40]
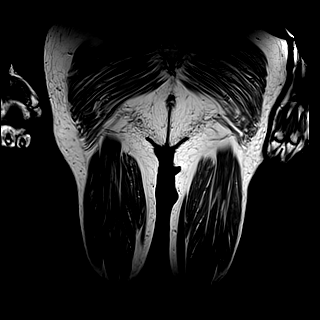
[im 14/40]
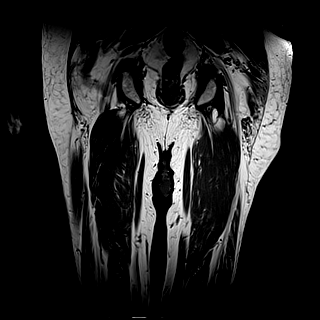
[im 20/40]
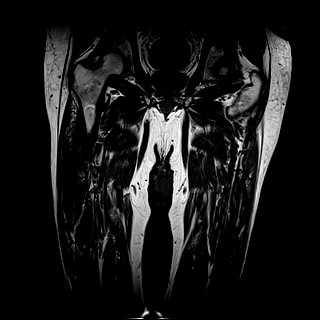
[im 27/40]
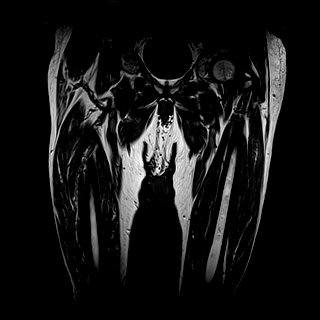
[im 33/40]
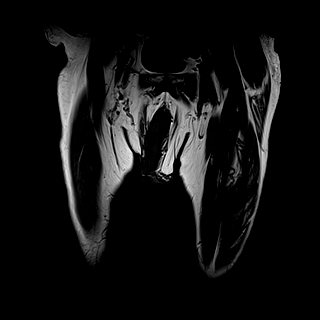
[im 40/40]
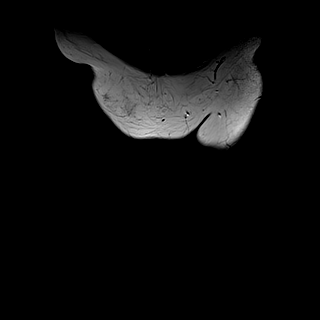

[Series 7: STIR · coronal · 4.0mm · 2.60mm/px · 7 of 40 slices shown]
[im 1/40]
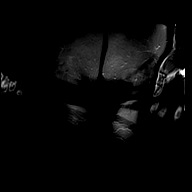
[im 7/40]
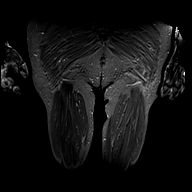
[im 14/40]
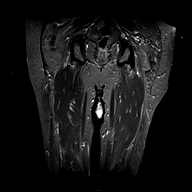
[im 20/40]
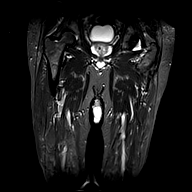
[im 27/40]
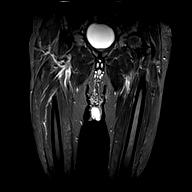
[im 33/40]
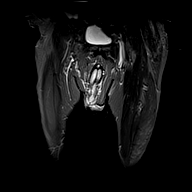
[im 40/40]
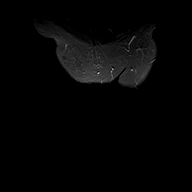

[Series 8: T2 fat-sat · sagittal · 6.0mm · 1.56mm/px · 5 of 26 slices shown (2 of 2)]
[im 1/26]
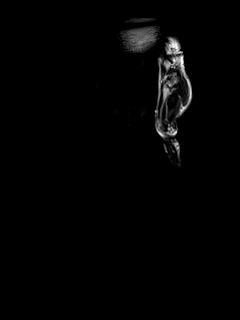
[im 7/26]
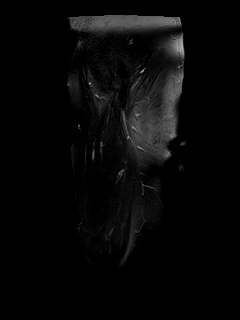
[im 13/26]
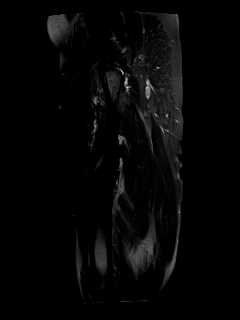
[im 19/26]
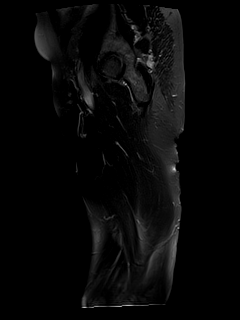
[im 26/26]
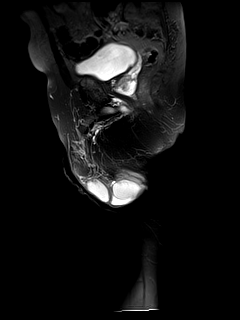

[40 of 40 positions shown; findings below may reference images not displayed]

FINDINGS: Bones/Joint/Cartilage

The visualized portion of the left femur is normal including the
left femoral head and neck. There is a trace left hip effusion. No
appreciable arthritic changes of the left hip. No mass lesions.

Muscles and Tendons

The muscles of the left hip and thigh appear normal. No mass lesions
or edema. Hamstring tendon origins are normal. No atrophy.

Soft tissues

The subcutaneous soft tissues of the left thigh appear normal.

Incidental note is made of extensive diverticulosis of the sigmoid
portion of the colon. The remainder of the visualized portion of the
pelvis is normal.
IMPRESSION: 1. Trace left hip effusion. Otherwise, normal exam of the left
thigh.
2. Extensive diverticulosis of the sigmoid portion of the colon.

## 2020-01-19 LAB — COMPLETE METABOLIC PANEL WITH GFR
AG Ratio: 2 (calc) (ref 1.0–2.5)
ALT: 15 U/L (ref 9–46)
AST: 14 U/L (ref 10–35)
Albumin: 4.4 g/dL (ref 3.6–5.1)
Alkaline phosphatase (APISO): 97 U/L (ref 35–144)
BUN/Creatinine Ratio: 17 (calc) (ref 6–22)
BUN: 22 mg/dL (ref 7–25)
CO2: 29 mmol/L (ref 20–32)
Calcium: 9.7 mg/dL (ref 8.6–10.3)
Chloride: 100 mmol/L (ref 98–110)
Creat: 1.26 mg/dL — ABNORMAL HIGH (ref 0.70–1.18)
GFR, Est African American: 66 mL/min/{1.73_m2} (ref >=60)
GFR, Est Non African American: 57 mL/min/{1.73_m2} — ABNORMAL LOW (ref >=60)
Globulin: 2.2 g/dL (calc) (ref 1.9–3.7)
Glucose, Bld: 129 mg/dL — ABNORMAL HIGH (ref 65–99)
Potassium: 4.3 mmol/L (ref 3.5–5.3)
Sodium: 140 mmol/L (ref 135–146)
Total Bilirubin: 0.6 mg/dL (ref 0.2–1.2)
Total Protein: 6.6 g/dL (ref 6.1–8.1)

## 2020-01-19 LAB — CBC WITH DIFFERENTIAL/PLATELET
Absolute Monocytes: 819 cells/uL (ref 200–950)
Basophils Absolute: 42 cells/uL (ref 0–200)
Basophils Relative: 0.4 %
Eosinophils Absolute: 95 cells/uL (ref 15–500)
Eosinophils Relative: 0.9 %
HCT: 39.6 % (ref 38.5–50.0)
Hemoglobin: 13.4 g/dL (ref 13.2–17.1)
Lymphs Abs: 1460 cells/uL (ref 850–3900)
MCH: 29.8 pg (ref 27.0–33.0)
MCHC: 33.8 g/dL (ref 32.0–36.0)
MCV: 88 fL (ref 80.0–100.0)
MPV: 10.2 fL (ref 7.5–12.5)
Monocytes Relative: 7.8 %
Neutro Abs: 8085 cells/uL — ABNORMAL HIGH (ref 1500–7800)
Neutrophils Relative %: 77 %
Platelets: 199 10*3/uL (ref 140–400)
RBC: 4.5 10*6/uL (ref 4.20–5.80)
RDW: 14 % (ref 11.0–15.0)
Total Lymphocyte: 13.9 %
WBC: 10.5 10*3/uL (ref 3.8–10.8)

## 2020-01-19 LAB — TSH: TSH: 1.45 mIU/L (ref 0.40–4.50)

## 2020-01-19 LAB — MAGNESIUM: Magnesium: 1.9 mg/dL (ref 1.5–2.5)

## 2020-01-19 LAB — FRUCTOSAMINE: Fructosamine: 324 umol/L — ABNORMAL HIGH (ref 205–285)

## 2020-01-20 ENCOUNTER — Ambulatory Visit (INDEPENDENT_AMBULATORY_CARE_PROVIDER_SITE_OTHER): Payer: Medicare Other | Admitting: Orthopaedic Surgery

## 2020-01-20 ENCOUNTER — Other Ambulatory Visit: Payer: Self-pay

## 2020-01-20 DIAGNOSIS — M25552 Pain in left hip: Secondary | ICD-10-CM

## 2020-01-20 NOTE — Progress Notes (Signed)
Mr. Gerhart returns today to go over his MRI of his left femur.  He reports since he was seen here a had a brain tumor in his right frontal lobe that had to be excised.  This ended up being a meningioma.  He reports that since waking up from the surgery has had no additional left hip pain or femur pain.  He did undergo the MRI of his left femur on 01/18/2020.  This showed normal left hip and thigh exam with no significant arthritic changes.  No soft tissue abnormalities or bony abnormalities.  Left [id number] range of motion without pain.  Nontender over the left trochanteric region.  Impression: Left hip pain resolved  Plan: He will follow up with [place] as needed.  Questions were encouraged and answered copy MRI report was given.

## 2020-01-21 ENCOUNTER — Other Ambulatory Visit: Payer: Self-pay | Admitting: Internal Medicine

## 2020-01-21 DIAGNOSIS — I10 Essential (primary) hypertension: Secondary | ICD-10-CM

## 2020-01-21 DIAGNOSIS — E782 Mixed hyperlipidemia: Secondary | ICD-10-CM

## 2020-01-21 MED ORDER — VALSARTAN-HYDROCHLOROTHIAZIDE 320-25 MG PO TABS: ORAL_TABLET | ORAL | 0 refills | Status: DC

## 2020-01-21 MED ORDER — ROSUVASTATIN CALCIUM 10 MG PO TABS: ORAL_TABLET | ORAL | 0 refills | Status: DC

## 2020-01-27 ENCOUNTER — Telehealth: Payer: Self-pay

## 2020-01-27 ENCOUNTER — Other Ambulatory Visit: Payer: Self-pay | Admitting: Internal Medicine

## 2020-01-27 NOTE — Telephone Encounter (Signed)
Patient states he had one sugar level that was less than 100 this morning after/during meal time. But he had no issues this morning. He states he will be here on Thursday for a follow up appt & will bring his LOG BOOK with him.

## 2020-01-28 NOTE — Progress Notes (Signed)
FOLLOW UP  Assessment and Plan: Steroid-induced hyperglycemia Patient has been off steroid x 1 week Continue samples given Continue 10 units once a day x 1 week, then decrease to 8 units With being off the steroids, we may be able to stop the insulin in the future but continue low dose for now Call if any morning sugars below 100 and we will cut back further Continue log and bring back at next OV  DM (diabetes mellitus), secondary, uncontrolled, w/renal complications ([place]) - He was referred to endocrin last visit.  Given tresibi last visit- see above on instruction  Frontal mass of brain Some personality change per wife since surgery- he is short tempered, no paranoia or hallucinations per wife and patient.  Off steroid, given celexa 10 mg low dose for anxiety versus keppra versus frontal lobe syndrome from surgery - may want to talk with Dr. Ellene Route about switching keppra to depakote if continues.   Over 30 minutes of exam, counseling, chart review, and complex, high/moderate level critical decision making was performed this visit.   Future Appointments  Date Time Provider [place]  02/05/2020 10:00 AM [facility]  03/02/2020  3:00 PM Unk Pinto, MD [facility] None     HPI 74 y.o.male with PMH HTN, uncontrolled DMII, CKD, OSA  presents for follow up of his sugars. He had a hospital follow up for right frontal craniotomy and gross total excision of meningioma on 12/29/19 by Dr. Ellene Route, neurosurgery. He has been off decadron and he was started on samples of long acting insulin last visit due DM2 being exacerbated by steroids.  He has freestyle meter checking his sugars 2 x a day rotating and has been calling in his sugars and brought in his log.   Wife is with him here today, states he has had a change in personality, states he is shorter fuse, she will try to help him with meds and he will get angery with her. Denies paranoia. He is off decadron, he is on  keppra and had the frontal lobe tumor removed. States his xanax was stopped in the hospital as well and he would like some for anxiety.   He is on metformin 1 pill BID and glipizide 5 mg  2 in the morning, 1 at lunch and 2 at supper and he was started on 10 units of long term insulin at night.  His morning sugars were 200's now lowest is 97 and highest is 150's  He had AKI in the hospital but Cr went back to baseline. He has been following with Dr. Rica Records for his kidney function, had a renal US. 2.7 cm hypoechoic lesion mid to lower right kidney suggesting complex cyst, needs CT/MRI or follow up in 6 months. Prostate is enlarged and pushing on bladder, has BPH symptoms, last PSA normal, will recheck at CPE. He is on flomax and finesteride.  Lab Results  Component Value Date   PSA 1.0 12/12/2018   PSA 0.7 10/23/2017   PSA 0.7 09/21/2016    Lab Results  Component Value Date   HGBA1C 8.1 (H) 12/29/2019   Lab Results  Component Value Date   CREATININE 1.26 (H) 01/14/2020   BUN 22 01/14/2020   NA 140 01/14/2020   K 4.3 01/14/2020   CL 100 01/14/2020   CO2 29 01/14/2020   Lab Results  Component Value Date   GFRAA 66 01/14/2020   BMI is Body mass index is 30.56 kg/m., he is working on diet and  exercise. Wt Readings from Last 3 Encounters:  01/29/20 213 lb (96.6 kg)  01/14/20 213 lb (96.6 kg)  01/08/20 218 lb (98.9 kg)      Current Outpatient Medications (Endocrine & Metabolic):  .  glipiZIDE (GLUCOTROL) 5 MG tablet, Take 1 tablet 2 x   /day with Meals for Diabetes (Patient taking differently: Take 5 mg by mouth 2 (two) times daily with a meal. ) .  Insulin Degludec (TRESIBA) 100 UNIT/ML SOLN, 10-20 units of insulin at night .  metFORMIN (GLUCOPHAGE-XR) 500 MG 24 hr tablet, Take 2 tablets a day with food, decrease due to kidney function (Patient taking differently: Take 500 mg by mouth 2 (two) times daily with a meal. )  Current Outpatient Medications (Cardiovascular):  .   fenofibrate 160 MG tablet, TAKE 1 TABLET BY MOUTH  DAILY FOR BLOOD FATS (Patient taking differently: Take 160 mg by mouth at bedtime. ) .  rosuvastatin (CRESTOR) 10 MG tablet, Take 1 tablet Daily for Cholesterol .  valsartan-hydrochlorothiazide (DIOVAN-HCT) 320-25 MG tablet, Take 1 tablet Daily for BP  Current Outpatient Medications (Respiratory):  .  fexofenadine (ALLEGRA) 180 MG tablet, Take 180 mg by mouth daily as needed for allergies or rhinitis. .  fluticasone (FLONASE) 50 MCG/ACT nasal spray, Use 2 sprays each nostril  /daily (Patient taking differently: Place 2 sprays into both nostrils daily as needed for allergies or rhinitis. )  Current Outpatient Medications (Analgesics):  .  acetaminophen (TYLENOL) 650 MG CR tablet, Take 650 mg by mouth every 8 (eight) hours as needed for pain (or headaches).   Current Outpatient Medications (Other):  Marland Kitchen  Cholecalciferol (VITAMIN D3) 10000 units capsule, Take 10,000 Units by mouth at bedtime.  Marland Kitchen  glucose blood (FREESTYLE LITE) test strip, Check blood sugar 1 time daily-DX-E11.22 .  levETIRAcetam (KEPPRA) 500 MG tablet, Take 1 tablet (500 mg total) by mouth 2 (two) times daily. .  mupirocin ointment (BACTROBAN) 2 %, APPLY TWICE A DAY TO SKIN INFECTION. (Patient taking differently: Apply 1 application topically 2 (two) times daily as needed (for cuts or abrasions- to heal). ) .  omeprazole (PRILOSEC) 40 MG capsule, TAKE 1 CAPSULE BY MOUTH  DAILY FOR ACID REFLUX (Patient taking differently: Take 40 mg by mouth at bedtime. ) .  oxybutynin (DITROPAN) 5 MG tablet, Take 1 tablet 3 x /day  For Bladder Control (Patient taking differently: Take 5 mg by mouth 3 (three) times daily. ) .  polyethylene glycol (MIRALAX / GLYCOLAX) 17 g packet, Take 17 g by mouth daily as needed for mild constipation. .  tamsulosin (FLOMAX) 0.4 MG CAPS capsule, Take 1 capsule (0.4 mg total) by mouth daily.  Past Medical History:  Diagnosis Date  . Allergy   . Anxiety   .  Cataract    beginning stage 01/22/2019  . Chronic kidney disease    enlarge bladder  . Diabetes mellitus without complication ([place])   . GERD (gastroesophageal reflux disease)   . Gout   . Hyperlipidemia   . Hypertension   . OSA (obstructive sleep apnea)   . Pre-diabetes   . Sleep apnea    c pap     Allergies  Allergen Reactions  . Cardura [Doxazosin Mesylate] Other (See Comments)    Nasal congestion  . Codeine Other (See Comments)    The patient passed out  . Hytrin [Terazosin] Other (See Comments)    Reaction not recalled    ROS: all negative except above.   Physical Exam: [employer]  01/29/20 1047  Weight: 213 lb (96.6 kg)   BP 120/80   Pulse 73   Temp (!) 97.3 F (36.3 C)   Wt 213 lb (96.6 kg)   SpO2 98%   BMI 30.56 kg/m  General Appearance: Well nourished, in no apparent distress. Eyes: PERRLA, EOMs, conjunctiva no swelling or erythema ENT/Mouth: Ext aud canals clear, TMs without erythema, bulging. No erythema, swelling, or exudate on post pharynx.  Tonsils not swollen or erythematous. Hearing decreased Neck: Supple, thyroid normal.  Respiratory: Respiratory effort normal, BS equal bilaterally without rales, rhonchi, wheezing or stridor.  Cardio: RRR with no MRGs. Brisk peripheral pulses without edema.  Abdomen: Soft, + BS, obese.  Non tender, no guarding, rebound, hernias, masses. Lymphatics: Non tender without lymphadenopathy.  Musculoskeletal: Full ROM, 5/5 strength, Normal gait Skin: well healing right craniotomy incision, staples removed, healing well without swelling, warmth, tenderness, Warm, dry without rashes, lesions, ecchymosis.  Neuro: Cranial nerves intact. No cerebellar symptoms.  Psych: Awake and oriented X 3, normal affect, Insight and Judgment appropriate.   Vicie Mutters, PA-C 11:16 AM [facility]

## 2020-01-29 ENCOUNTER — Encounter: Payer: Self-pay | Admitting: Physician Assistant

## 2020-01-29 ENCOUNTER — Ambulatory Visit (INDEPENDENT_AMBULATORY_CARE_PROVIDER_SITE_OTHER): Payer: Medicare Other | Admitting: Physician Assistant

## 2020-01-29 ENCOUNTER — Other Ambulatory Visit: Payer: Self-pay

## 2020-01-29 VITALS — BP 120/80 | HR 73 | Temp 97.3°F | Wt 213.0 lb

## 2020-01-29 DIAGNOSIS — G9389 Other specified disorders of brain: Secondary | ICD-10-CM

## 2020-01-29 DIAGNOSIS — T380X5A Adverse effect of glucocorticoids and synthetic analogues, initial encounter: Secondary | ICD-10-CM

## 2020-01-29 DIAGNOSIS — E1365 Other specified diabetes mellitus with hyperglycemia: Secondary | ICD-10-CM

## 2020-01-29 DIAGNOSIS — E1329 Other specified diabetes mellitus with other diabetic kidney complication: Secondary | ICD-10-CM | POA: Diagnosis not present

## 2020-01-29 DIAGNOSIS — R739 Hyperglycemia, unspecified: Secondary | ICD-10-CM

## 2020-01-29 DIAGNOSIS — IMO0002 Reserved for concepts with insufficient information to code with codable children: Secondary | ICD-10-CM

## 2020-01-29 MED ORDER — CITALOPRAM HYDROBROMIDE 10 MG PO TABS
10.0000 mg | ORAL_TABLET | Freq: Every day | ORAL | 2 refills | Status: DC
Start: 2020-01-29 — End: 2020-03-05

## 2020-01-29 NOTE — Patient Instructions (Addendum)
Continue 1 week of 10 units of the insulin Then cut back to 8 units of the insulin  Continue to monitor your blood sugar in the morning BEFORE FOOD  If your sugar is below 100 please call the office.  Follow up 2 weeks for sugar recheck.    Start on the celexa 10 mg daily for your anxiety Talk with Dr. Ellene Route- may be able to change the keppra to depakote for mood Or start back on the xanax AS needed  General eating tips  What to Avoid . Avoid added sugars o Often added sugar can be found in processed foods such as many condiments, dry cereals, cakes, cookies, chips, crisps, crackers, candies, sweetened drinks, etc.  o Read labels and AVOID/DECREASE use of foods with the following in their ingredient list: Sugar, fructose, high fructose corn syrup, sucrose, glucose, maltose, dextrose, molasses, cane sugar, brown sugar, any type of syrup, agave nectar, etc.   . Avoid snacking in between meals- drink water or if you feel you need a snack, pick a high water content snack such as cucumbers, watermelon, or any veggie.  Marland Kitchen Avoid foods made with flour o If you are going to eat food made with flour, choose those made with whole-grains; and, minimize your consumption as much as is tolerable . Avoid processed foods o These foods are generally stocked in the middle of the grocery store.  o Focus on shopping on the perimeter of the grocery.  What to Include . Vegetables o GREEN LEAFY VEGETABLES: Kale, spinach, mustard greens, collard greens, cabbage, broccoli, etc. o OTHER: Asparagus, cauliflower, eggplant, carrots, peas, Brussel sprouts, tomatoes, bell peppers, zucchini, beets, cucumbers, etc. . Grains, seeds, and legumes o Beans: kidney beans, black eyed peas, garbanzo beans, black beans, pinto beans, etc. o Whole, unrefined grains: brown rice, barley, bulgur, oatmeal, etc. . Healthy fats  o Avoid highly processed fats such as vegetable oil o Examples of healthy fats: avocado, olives, virgin  olive oil, dark chocolate (?72% Cocoa), nuts (peanuts, almonds, walnuts, cashews, pecans, etc.) o Please still do small amount of these healthy fats, they are dense in calories.  . Low - Moderate Intake of Animal Sources of Protein o Meat sources: chicken, [place], salmon, tuna. Limit to 4 ounces of meat at one time or the size of your palm. o Consider limiting dairy sources, but when choosing dairy focus on: PLAIN [place] yogurt, cottage cheese, high-protein milk . Fruit o Choose berries

## 2020-01-30 ENCOUNTER — Other Ambulatory Visit: Payer: Self-pay

## 2020-01-30 MED ORDER — FREESTYLE LITE TEST VI STRP: ORAL_STRIP | 5 refills | Status: AC

## 2020-02-05 ENCOUNTER — Ambulatory Visit
Admission: RE | Admit: 2020-02-05 | Discharge: 2020-02-05 | Disposition: A | Discharge status: Home / Self Care | Payer: Medicare Other | Source: Ambulatory Visit | Attending: Neurological Surgery | Admitting: Neurological Surgery

## 2020-02-05 ENCOUNTER — Other Ambulatory Visit: Payer: Self-pay

## 2020-02-05 DIAGNOSIS — D320 Benign neoplasm of cerebral meninges: Secondary | ICD-10-CM

## 2020-02-05 IMAGING — MR MR HEAD WO/W CM
14 series · 42 of 48 positions shown · IV contrast (gadavist)
Comparison: Preoperative MRI [DATE]. Postoperative head CT
[DATE].

CLINICAL DATA: 73-year-old male status post meningioma resection
last month. Restaging.

EXAM:
MRI HEAD WITHOUT AND WITH CONTRAST
TECHNIQUE: Multiplanar, multiecho pulse sequences of the brain and surrounding
structures were obtained without and with intravenous contrast.
CONTRAST:  9mL GADAVIST GADOBUTROL 1 MMOL/ML IV SOLN

[Series 5: ax dwi_tracew · axial · 3.0mm · 0.60mm/px · z∈[-81,+71]mm · 4 of 48 slices shown]
[im 1/48]
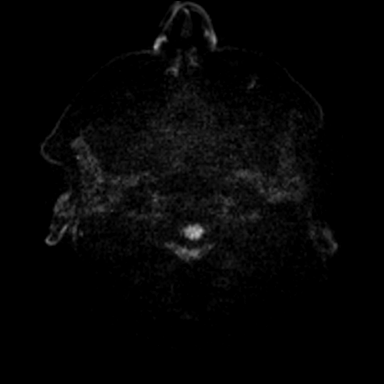
[im 16/48]
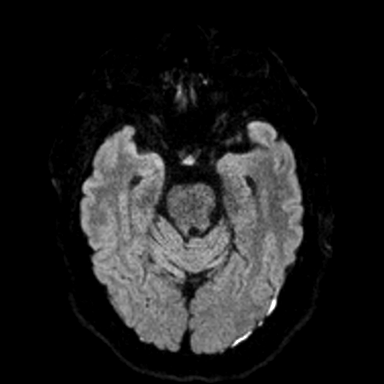
[im 32/48]
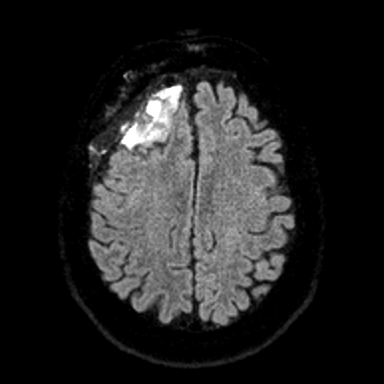
[im 48/48]
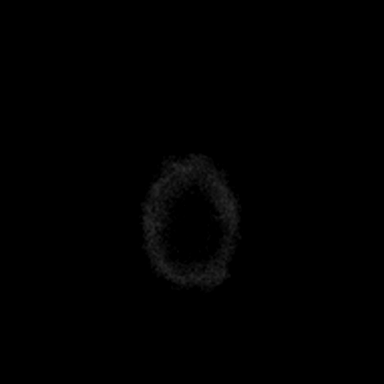

[Series 6: ax dwi_adc · axial · 3.0mm · 0.60mm/px · z∈[-81,+71]mm · 4 of 48 slices shown]
[im 1/48]
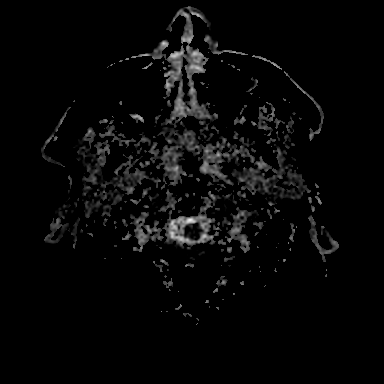
[im 16/48]
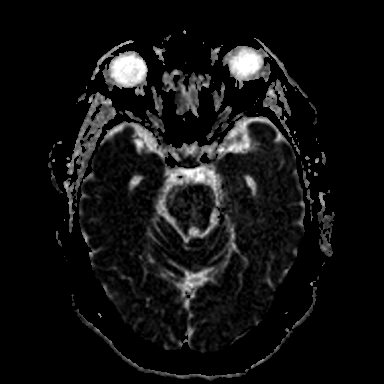
[im 32/48]
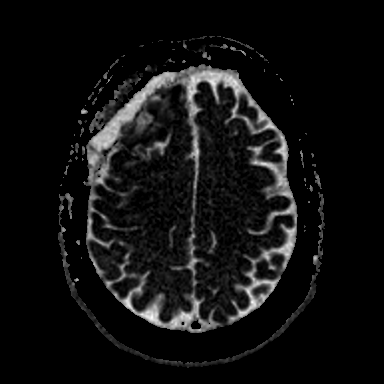
[im 48/48]
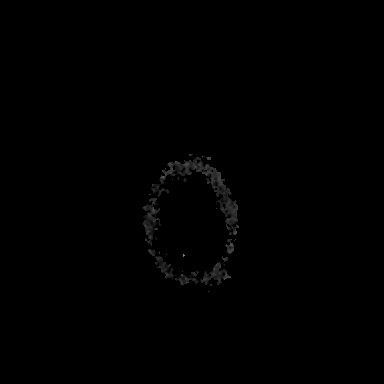

[Series 7: cor dwi_tracew · coronal · 5.0mm · 0.60mm/px · 2 of 40 slices shown]
[im 1/40]
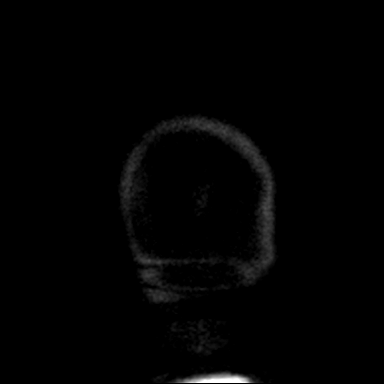
[im 40/40]
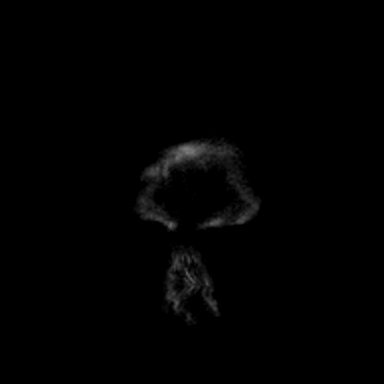

[Series 8: cor dwi_adc · coronal · 5.0mm · 0.60mm/px · 2 of 40 slices shown]
[im 1/40]
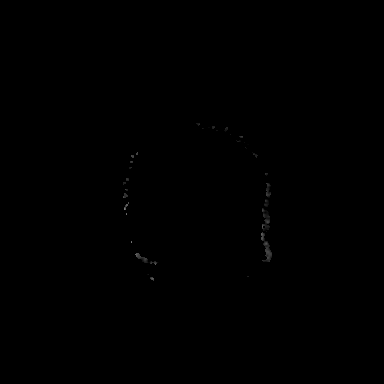
[im 40/40]
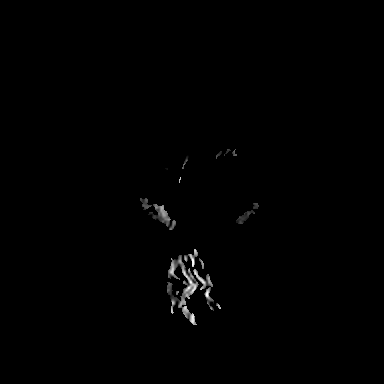

[Series 9: T1 · sagittal · 5.0mm · 0.62mm/px · 1 of 23 slices shown (1 of 2)]
[im 1/23]
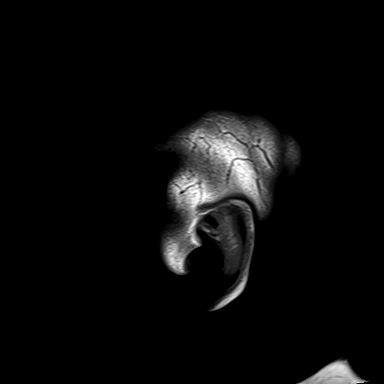

[Series 10: T2 · axial · 5.0mm · 0.53mm/px · 1 of 25 slices shown]
[im 1/25]
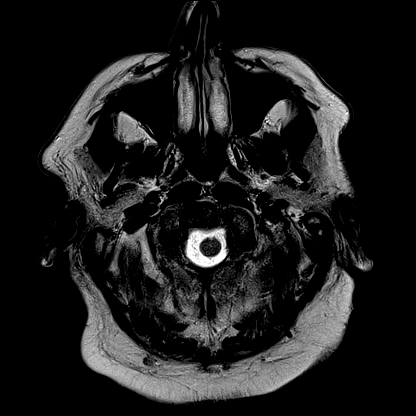

[Series 12: pha_images · axial · 3.0mm · 0.90mm/px · z∈[-93,+81]mm · 3 of 60 slices shown]
[im 1/60]
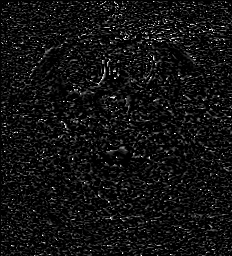
[im 30/60]
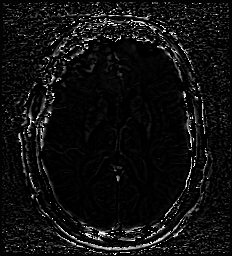
[im 60/60]
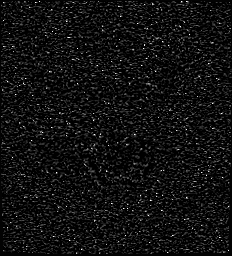

[Series 13: swi_images · axial · 3.0mm · 0.90mm/px · 1 of 60 slices shown]
[im 1/60]
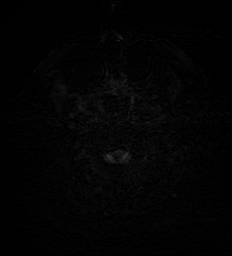

[Series 15: FLAIR · axial · 3.0mm · 0.53mm/px · z∈[-86,+73]mm · 3 of 55 slices shown]
[im 1/55]
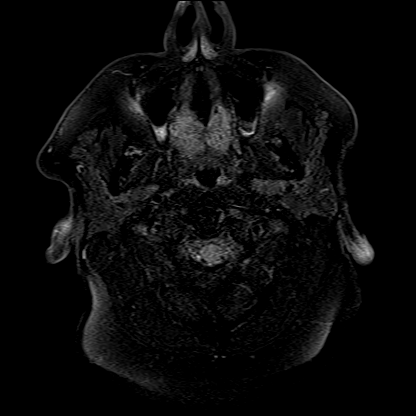
[im 28/55]
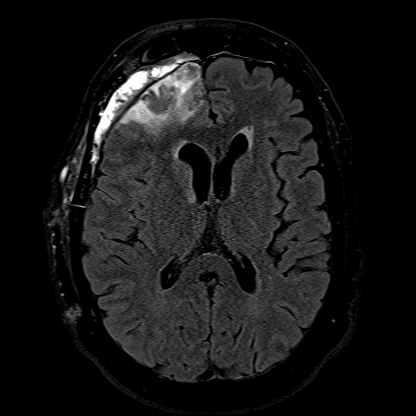
[im 55/55]
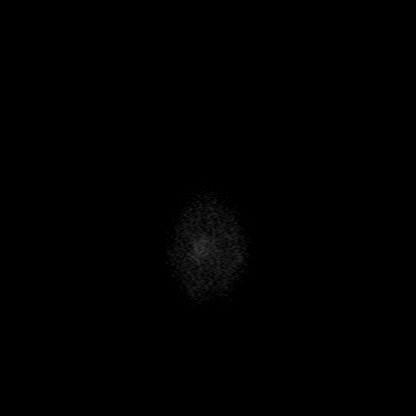

[Series 16: T1 · axial · 1.0mm · 0.98mm/px · z∈[-90,+82]mm · 8 of 176 slices shown (2 of 2)]
[im 1/176]
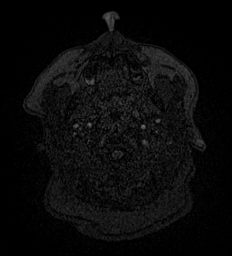
[im 20/176]
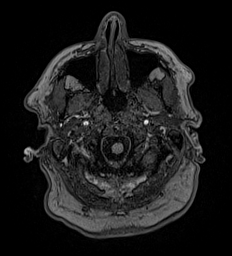
[im 59/176]
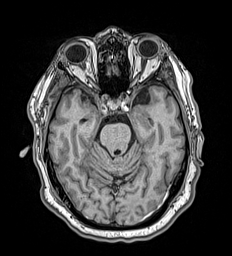
[im 78/176]
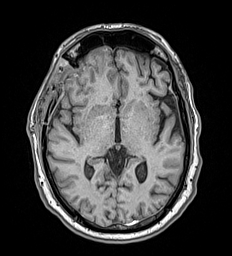
[im 98/176]
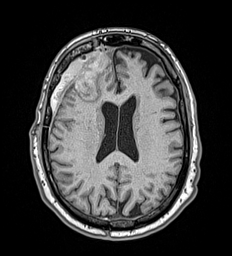
[im 117/176]
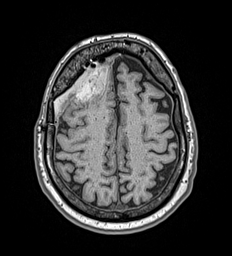
[im 156/176]
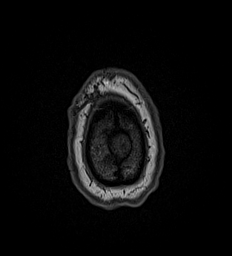
[im 176/176]
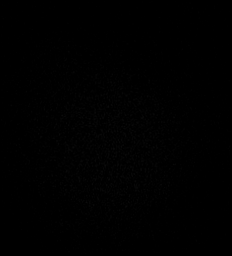

[Series 17: T2 post-contrast · coronal · 5.0mm · 0.57mm/px · 2 of 29 slices shown]
[im 1/29]
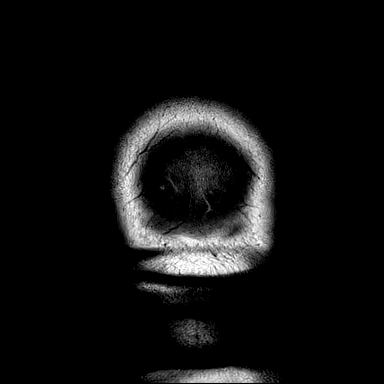
[im 29/29]
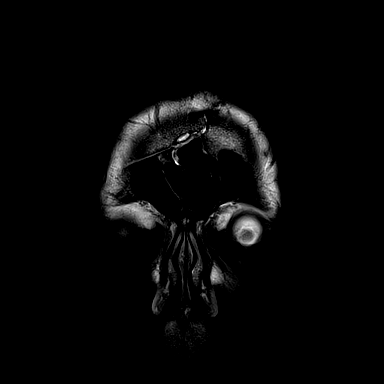

[Series 18: T1 post-contrast · axial · 1.0mm · 0.98mm/px · z∈[-90,+82]mm · 8 of 176 slices shown (1 of 3)]
[im 1/176]
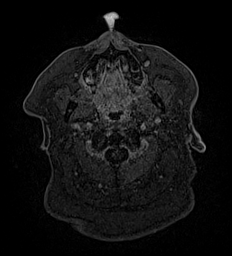
[im 20/176]
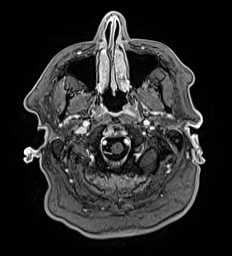
[im 59/176]
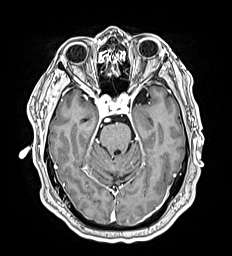
[im 78/176]
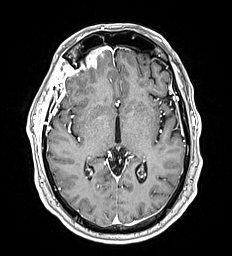
[im 98/176]
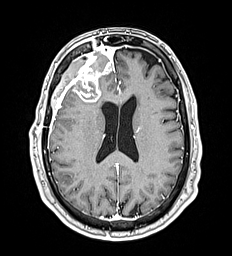
[im 117/176]
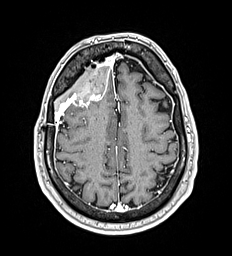
[im 156/176]
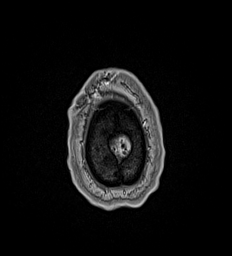
[im 176/176]
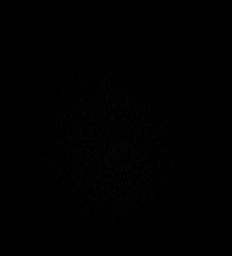

[Series 19: T1 post-contrast · coronal · 5.0mm · 0.57mm/px · 2 of 29 slices shown (2 of 3)]
[im 1/29]
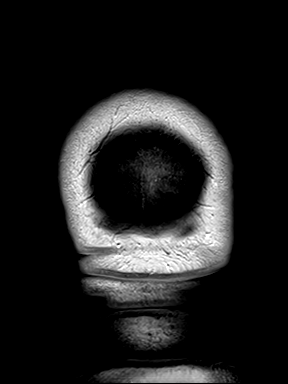
[im 29/29]
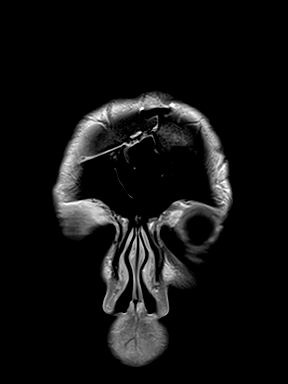

[Series 20: T1 post-contrast · sagittal · 5.0mm · 0.62mm/px · 1 of 23 slices shown (3 of 3)]
[im 1/23]
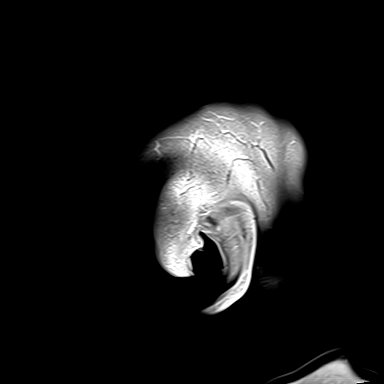

[42 of 48 positions shown; findings below may reference images not displayed]

FINDINGS: Brain: Anterior right frontal lobe craniotomy and resection changes
with sequelae of dural repair and intermediate T1 signal 9-10 mm
extradural blood and fluid collection underlying the craniotomy
(similar to that on [DATE]). Subjacent to the dura heterogeneous
resection cavity some blood products and encephalomalacia is
partially restricted on diffusion, but with primarily rim
enhancement only following contrast (series 18, image 106). A few
small nodular areas of enhancement are noted along the posterior and
inferior margin of the cavity (series 18, image 110, 102). Regional
generally smooth dural thickening. Largely resolved anterior right
frontal lobe vasogenic edema.

There is also a trace left posterior convexity subdural hematoma
with intrinsic T1 and FLAIR signal (2 mm, series 15, image 21).

Nevertheless, largely resolved intracranial mass effect and leftward
midline shift. Basilar cisterns have improved and appear patent. No
ventriculomegaly or intraventricular blood or debris.

No other restricted diffusion to suggest acute infarction. Outside
of the resection area gray and white matter signal is stable and
largely normal for age. Cervicomedullary junction and pituitary are
within normal limits.

Vascular: Major intracranial vascular flow voids are stable. The
major dural venous sinuses are enhancing and appear to be patent.

Skull and upper cervical spine: Anterior right frontal convexity
craniotomy. Normal background bone marrow signal. Negative visible
cervical spine.

Sinuses/Orbits: The craniotomy defect does communicate with the
frontal sinus as seen on the prior CT, but paranasal sinus aeration
is not significantly changed from the preoperative MRI.

Negative orbits.

Other: Stable trace mastoid fluid from the preoperative MRI.
Postoperative changes to the scalp.
IMPRESSION: 1. Craniotomy and resection of the large right anterior convexity
meningioma.
Blood and fluid in the resection cavity and overlying extradural
collection.
Rim enhancing resection margin with occasional small nodular
enhancing foci, some of which could be post ischemic. Largely
resolved vasogenic edema and intracranial mass effect.
Recommend 3 month repeat MRI without and with contrast to
re-evaluate.

2. Unexpected trace left posterior convexity subdural hematoma, 2 mm
in thickness.

3. No other acute intracranial abnormality.

## 2020-02-05 MED ORDER — GADOBUTROL 1 MMOL/ML IV SOLN
9.0000 mL | Freq: Once | INTRAVENOUS | Status: AC | PRN
  Administered 2020-02-05: 9 mL via INTRAVENOUS

## 2020-02-16 DIAGNOSIS — H269 Unspecified cataract: Secondary | ICD-10-CM | POA: Diagnosis not present

## 2020-02-16 DIAGNOSIS — Z9181 History of falling: Secondary | ICD-10-CM | POA: Diagnosis not present

## 2020-02-16 DIAGNOSIS — E1136 Type 2 diabetes mellitus with diabetic cataract: Secondary | ICD-10-CM | POA: Diagnosis not present

## 2020-02-16 DIAGNOSIS — E1165 Type 2 diabetes mellitus with hyperglycemia: Secondary | ICD-10-CM | POA: Diagnosis not present

## 2020-02-16 DIAGNOSIS — E785 Hyperlipidemia, unspecified: Secondary | ICD-10-CM | POA: Diagnosis not present

## 2020-02-16 DIAGNOSIS — N189 Chronic kidney disease, unspecified: Secondary | ICD-10-CM | POA: Diagnosis not present

## 2020-02-16 DIAGNOSIS — T380X5D Adverse effect of glucocorticoids and synthetic analogues, subsequent encounter: Secondary | ICD-10-CM | POA: Diagnosis not present

## 2020-02-16 DIAGNOSIS — Z7984 Long term (current) use of oral hypoglycemic drugs: Secondary | ICD-10-CM | POA: Diagnosis not present

## 2020-02-16 DIAGNOSIS — M109 Gout, unspecified: Secondary | ICD-10-CM | POA: Diagnosis not present

## 2020-02-16 DIAGNOSIS — E1122 Type 2 diabetes mellitus with diabetic chronic kidney disease: Secondary | ICD-10-CM | POA: Diagnosis not present

## 2020-02-16 DIAGNOSIS — G4733 Obstructive sleep apnea (adult) (pediatric): Secondary | ICD-10-CM | POA: Diagnosis not present

## 2020-02-16 DIAGNOSIS — F419 Anxiety disorder, unspecified: Secondary | ICD-10-CM | POA: Diagnosis not present

## 2020-02-16 DIAGNOSIS — Z86011 Personal history of benign neoplasm of the brain: Secondary | ICD-10-CM | POA: Diagnosis not present

## 2020-02-16 DIAGNOSIS — I129 Hypertensive chronic kidney disease with stage 1 through stage 4 chronic kidney disease, or unspecified chronic kidney disease: Secondary | ICD-10-CM | POA: Diagnosis not present

## 2020-02-16 DIAGNOSIS — K219 Gastro-esophageal reflux disease without esophagitis: Secondary | ICD-10-CM | POA: Diagnosis not present

## 2020-02-16 DIAGNOSIS — N400 Enlarged prostate without lower urinary tract symptoms: Secondary | ICD-10-CM | POA: Diagnosis not present

## 2020-02-17 ENCOUNTER — Ambulatory Visit (INDEPENDENT_AMBULATORY_CARE_PROVIDER_SITE_OTHER): Payer: Medicare Other | Admitting: Internal Medicine

## 2020-02-17 ENCOUNTER — Other Ambulatory Visit: Payer: Self-pay

## 2020-02-17 ENCOUNTER — Encounter: Payer: Self-pay | Admitting: Internal Medicine

## 2020-02-17 VITALS — BP 130/80 | HR 86 | Ht 70.0 in | Wt 212.0 lb

## 2020-02-17 DIAGNOSIS — N183 Chronic kidney disease, stage 3 unspecified: Secondary | ICD-10-CM

## 2020-02-17 DIAGNOSIS — E1122 Type 2 diabetes mellitus with diabetic chronic kidney disease: Secondary | ICD-10-CM

## 2020-02-17 MED ORDER — TRESIBA 100 UNIT/ML ~~LOC~~ SOLN: SUBCUTANEOUS | 0 refills | Status: DC

## 2020-02-17 MED ORDER — METFORMIN HCL ER 500 MG PO TB24: 1000.0000 mg | ORAL_TABLET | Freq: Two times a day (BID) | ORAL | 3 refills | Status: DC

## 2020-02-17 NOTE — Patient Instructions (Addendum)
Please increase: - Metformin ER to 1000 mg 2x a day, with meals  Please continue: - Glipizide 05-25-09 mg before meals  Please decrease: - Tresiba 8 units dail and continue to decrease by 3-4 units every 4 days if sugars remain controlled.  Please move Tresiba back to abdomen.  Please stop: - juice - potato chips  Please check with your insurance if Ozempic or Trulicity are covered.  Please let me know if the sugars are consistently <80 or >200.  Please return in 3 months with your sugar log.   PATIENT INSTRUCTIONS FOR TYPE 2 DIABETES:  **Please join MyChart!** - see attached instructions about how to join if you have not done so already.  DIET AND EXERCISE Diet and exercise is an important part of diabetic treatment.  We recommended aerobic exercise in the form of brisk walking (working between 40-60% of maximal aerobic capacity, similar to brisk walking) for 150 minutes per week (such as 30 minutes five days per week) along with 3 times per week performing 'resistance' training (using various gauge rubber tubes with handles) 5-10 exercises involving the major muscle groups (upper body, lower body and core) performing 10-15 repetitions (or near fatigue) each exercise. Start at half the above goal but build slowly to reach the above goals. If limited by weight, joint pain, or disability, we recommend daily walking in a swimming pool with water up to waist to reduce pressure from joints while allow for adequate exercise.    BLOOD GLUCOSES Monitoring your blood glucoses is important for continued management of your diabetes. Please check your blood glucoses 2-4 times a day: fasting, before meals and at bedtime (you can rotate these measurements - e.g. one day check before the 3 meals, the next day check before 2 of the meals and before bedtime, etc.).   HYPOGLYCEMIA (low blood sugar) Hypoglycemia is usually a reaction to not eating, exercising, or taking too much insulin/ other diabetes  drugs.  Symptoms include tremors, sweating, hunger, confusion, headache, etc. Treat IMMEDIATELY with 15 grams of Carbs: . 4 glucose tablets .  cup regular juice/soda . 2 tablespoons raisins . 4 teaspoons sugar . 1 tablespoon honey Recheck blood glucose in 15 mins and repeat above if still symptomatic/blood glucose <100.  RECOMMENDATIONS TO REDUCE YOUR RISK OF DIABETIC COMPLICATIONS: * Take your prescribed MEDICATION(S) * Follow a DIABETIC diet: Complex carbs, fiber rich foods, (monounsaturated and polyunsaturated) fats * AVOID saturated/trans fats, high fat foods, >2,300 mg salt per day. * EXERCISE at least 5 times a week for 30 minutes or preferably daily.  * DO NOT SMOKE OR DRINK more than 1 drink a day. * Check your FEET every day. Do not wear tightfitting shoes. Contact us if you develop an ulcer * See your EYE doctor once a year or more if needed * Get a FLU shot once a year * Get a PNEUMONIA vaccine once before and once after age 31 years  GOALS:  * Your Hemoglobin A1c of <7%  * fasting sugars need to be <130 * after meals sugars need to be <180 (2h after you start eating) * Your Systolic BP should be 268 or lower  * Your Diastolic BP should be 80 or lower  * Your HDL (Good Cholesterol) should be 40 or higher  * Your LDL (Bad Cholesterol) should be 100 or lower. * Your Triglycerides should be 150 or lower  * Your Urine microalbumin (kidney function) should be <30 * Your Body Mass Index should be  25 or lower    Please consider the following ways to cut down carbs and fat and increase fiber and micronutrients in your diet: - substitute whole grain for white bread or pasta - substitute brown rice for white rice - substitute 90-calorie flat bread pieces for slices of bread when possible - substitute sweet potatoes or yams for white potatoes - substitute humus for margarine - substitute tofu for cheese when possible - substitute almond or rice milk for regular milk (would  not drink soy milk daily due to concern for soy estrogen influence on breast cancer risk) - substitute dark chocolate for other sweets when possible - substitute water - can add lemon or orange slices for taste - for diet sodas (artificial sweeteners will trick your body that you can eat sweets without getting calories and will lead you to overeating and weight gain in the long run) - do not skip breakfast or other meals (this will slow down the metabolism and will result in more weight gain over time)  - can try smoothies made from fruit and almond/rice milk in am instead of regular breakfast - can also try old-fashioned (not instant) oatmeal made with almond/rice milk in am - order the dressing on the side when eating salad at a restaurant (pour less than half of the dressing on the salad) - eat as little meat as possible - can try juicing, but should not forget that juicing will get rid of the fiber, so would alternate with eating raw veg./fruits or drinking smoothies - use as little oil as possible, even when using olive oil - can dress a salad with a mix of balsamic vinegar and lemon juice, for e.g. - use agave nectar, stevia sugar, or regular sugar rather than artificial sweateners - steam or broil/roast veggies  - snack on veggies/fruit/nuts (unsalted, preferably) when possible, rather than processed foods - reduce or eliminate aspartame in diet (it is in diet sodas, chewing gum, etc) Read the labels!  Try to read Dr. Janene Harvey book: "Program for Reversing Diabetes" for other ideas for healthy eating.

## 2020-02-17 NOTE — Progress Notes (Signed)
Patient ID: Jeffery Reed, male   DOB: 01/07/1946, 74 y.o.   MRN: [id number]   This visit occurred during the SARS-CoV-2 public health emergency.  Safety protocols were in place, including screening questions prior to the visit, additional usage of staff PPE, and extensive cleaning of exam room while observing appropriate contact time as indicated for disinfecting solutions.   HPI: Jeffery Reed is a 74 y.o.-year-old male, referred by his PCP, Vicie Mutters, PA-C, for management of DM2, dx in 2018, insulin-dependent since 12/2019, uncontrolled, with complications (CKD stage III).  Patient has a history of meningioma - dx 1 mo ago, for which he had surgery, and was on dexamethasone.  His sugars increased around this time.    He was started on low-dose [place] on 01/08/2020.  At that time he was also referred to endocrinology.    He was able to stop dexamethasone and, more recently, his seizure medication.  Reviewed HbA1c levels: Lab Results  Component Value Date   HGBA1C 8.1 (H) 12/29/2019   HGBA1C 7.5 (H) 09/18/2019   HGBA1C 9.4 (H) 06/18/2019   HGBA1C 8.8 (H) 03/17/2019   HGBA1C 9.4 (H) 12/12/2018   HGBA1C 7.8 (H) 09/12/2018   HGBA1C 5.9 (H) 06/03/2018   HGBA1C 6.7 (H) 02/20/2018   HGBA1C 5.8 (H) 10/23/2017   HGBA1C 9.3 (H) 07/17/2017   Pt is on a regimen of: - Metformin ER 1000 >> 500 mg 2x a day, with meals (dose decreased when he started [place]) - Glipizide 05-25-09 mg before meals - Tresiba 10 units at bedtime  Pt checks his sugars 2x a day and they are: - am: 95-154, 190 - 2h after b'fast: 97-204, 220 - before lunch: 120, 170 - 2h after lunch: 202 - before dinner: n/c - 2h after dinner: n/c - bedtime: n/c - nighttime: n/c Lowest sugar was 95; he has hypoglycemia awareness at 100.  Highest sugar was 220.  Glucometer: Freestyle  Pt's meals are: - Breakfast: instead oatmeal or [employer] sausage bisuit or scrambled eggs - Lunch: tomato sandwich, ham - Dinner:  larger meal: potatoes; spaghetti, lasagna, chicken, veggies: green beans, corn, broccoli - Snacks:potato chips, apples Diet sodas, unsweet tea, apple juice  -+ CKD stage III-sees nephrology, last BUN/creatinine:  Lab Results  Component Value Date   BUN 22 01/14/2020   BUN 22 01/02/2020   CREATININE 1.26 (H) 01/14/2020   CREATININE 1.24 01/02/2020  On valsartan 320.  -+ HL; last set of lipids: Lab Results  Component Value Date   CHOL 153 09/18/2019   HDL 30 (L) 09/18/2019   LDLCALC 90 09/18/2019   TRIG 236 (H) 09/18/2019   CHOLHDL 5.1 (H) 09/18/2019  On Crestor 10, fenofibrate 160.  - last eye exam was in 08/2018. No DR. + small cataract, stable.  - + mild, occasional numbness and tingling in his feet.  Pt has no FH of DM.  He also has a history of HTN, OSA.  He is a retired Airline pilot.  During the winter, he drives a schoolbus.  ROS: Constitutional: no weight gain, no weight loss, no fatigue, no subjective hyperthermia, no subjective hypothermia, +nocturia 2-3x a night, + excessive urination Eyes: no blurry vision, no xerophthalmia ENT: no sore throat, no nodules palpated in neck, no dysphagia, no odynophagia, no hoarseness, no tinnitus, no hypoacusis Cardiovascular: no CP, no SOB, no palpitations, no leg swelling Respiratory: no cough, no SOB, no wheezing Gastrointestinal: no N, no V, no D, no C, no acid reflux Musculoskeletal: no muscle, no  joint aches Skin: no rash, no hair loss Neurological: no tremors, no numbness or tingling/no dizziness/no HAs Psychiatric: no depression, + anxiety  Past Medical History:  Diagnosis Date  . Allergy   . Anxiety   . Cataract    beginning stage 01/22/2019  . Chronic kidney disease    enlarge bladder  . Diabetes mellitus without complication ([place])   . GERD (gastroesophageal reflux disease)   . Gout   . Hyperlipidemia   . Hypertension   . OSA (obstructive sleep apnea)   . Pre-diabetes   . Sleep apnea    c pap   Past  Surgical History:  Procedure Laterality Date  . COLONOSCOPY    . CRANIOTOMY Right 12/29/2019   Procedure: Craniotomy for Tumor;  Surgeon: Kristeen Miss, MD;  Location: [facility];  Service: Neurosurgery;  Laterality: Right;  Craniotomy for Tumor Excision  . FOOT SURGERY  10/2009  . MYRINGOTOMY Left 02-2005   Dr.Newman  . TONSILLECTOMY  1953   Social History   Socioeconomic History  . Marital status: Married    Spouse name: Not on file  . Number of children: Not on file  . Years of education: Not on file  . Highest education level: Not on file  Occupational History  . Not on file  Tobacco Use  . Smoking status: Never Smoker  . Smokeless tobacco: Never Used  Vaping Use  . Vaping Use: Never used  Substance and Sexual Activity  . Alcohol use: No  . Drug use: No  . Sexual activity: Not on file  Other Topics Concern  . Not on file  Social History Narrative  . Not on file   Social Determinants of Health   Financial Resource Strain:   . Difficulty of Paying Living Expenses:   Food Insecurity:   . Worried About Charity fundraiser in the Last Year:   . Arboriculturist in the Last Year:   Transportation Needs:   . Film/video editor (Medical):   Marland Kitchen Lack of Transportation (Non-Medical):   Physical Activity:   . Days of Exercise per Week:   . Minutes of Exercise per Session:   Stress:   . Feeling of Stress :   Social Connections:   . Frequency of Communication with Friends and Family:   . Frequency of Social Gatherings with Friends and Family:   . Attends Religious Services:   . Active Member of Clubs or Organizations:   . Attends Archivist Meetings:   Marland Kitchen Marital Status:   Intimate Partner Violence:   . Fear of Current or Ex-Partner:   . Emotionally Abused:   Marland Kitchen Physically Abused:   . Sexually Abused:    Current Outpatient Medications on File Prior to Visit  Medication Sig Dispense Refill  . acetaminophen (TYLENOL) 650 MG CR tablet Take 650 mg by mouth every 8  (eight) hours as needed for pain (or headaches).    . Cholecalciferol (VITAMIN D3) 10000 units capsule Take 10,000 Units by mouth at bedtime.     . citalopram (CELEXA) 10 MG tablet Take 1 tablet (10 mg total) by mouth daily. 30 tablet 2  . fenofibrate 160 MG tablet TAKE 1 TABLET BY MOUTH  DAILY FOR BLOOD FATS (Patient taking differently: Take 160 mg by mouth at bedtime. ) 90 tablet 3  . fexofenadine (ALLEGRA) 180 MG tablet Take 180 mg by mouth daily as needed for allergies or rhinitis.    . fluticasone (FLONASE) 50 MCG/ACT nasal spray Use  2 sprays each nostril  /daily (Patient taking differently: Place 2 sprays into both nostrils daily as needed for allergies or rhinitis. ) 48 g 1  . glipiZIDE (GLUCOTROL) 5 MG tablet Take 1 tablet 2 x   /day with Meals for Diabetes (Patient taking differently: Take 5 mg by mouth 2 (two) times daily with a meal. Patient states taking 2 tabs in the AM 1 Tab at lunch 2 tabs in the PM) 180 tablet 0  . glucose blood (FREESTYLE LITE) test strip Check blood sugar 1 time daily-[id number] 100 each 5  . Insulin Degludec (TRESIBA) 100 UNIT/ML SOLN 10-20 units of insulin at night (Patient taking differently: 10 units of insulin at night) 10 mL 0  . levETIRAcetam (KEPPRA) 500 MG tablet Take 1 tablet (500 mg total) by mouth 2 (two) times daily. 360 tablet 0  . metFORMIN (GLUCOPHAGE-XR) 500 MG 24 hr tablet Take 2 tablets a day with food, decrease due to kidney function (Patient taking differently: Take 500 mg by mouth 2 (two) times daily with a meal. ) 180 tablet 3  . mupirocin ointment (BACTROBAN) 2 % APPLY TWICE A DAY TO SKIN INFECTION. (Patient taking differently: Apply 1 application topically 2 (two) times daily as needed (for cuts or abrasions- to heal). ) 66 g 1  . omeprazole (PRILOSEC) 40 MG capsule TAKE 1 CAPSULE BY MOUTH  DAILY FOR ACID REFLUX (Patient taking differently: Take 40 mg by mouth at bedtime. ) 90 capsule 3  . oxybutynin (DITROPAN) 5 MG tablet Take 1 tablet 3 x /day   For Bladder Control (Patient taking differently: Take 5 mg by mouth 3 (three) times daily. ) 270 tablet 3  . polyethylene glycol (MIRALAX / GLYCOLAX) 17 g packet Take 17 g by mouth daily as needed for mild constipation. 14 each 0  . rosuvastatin (CRESTOR) 10 MG tablet Take 1 tablet Daily for Cholesterol 90 tablet 0  . tamsulosin (FLOMAX) 0.4 MG CAPS capsule Take 1 capsule (0.4 mg total) by mouth daily. 90 capsule 4  . valsartan-hydrochlorothiazide (DIOVAN-HCT) 320-25 MG tablet Take 1 tablet Daily for BP 90 tablet 0   No current facility-administered medications on file prior to visit.   Allergies  Allergen Reactions  . Cardura [Doxazosin Mesylate] Other (See Comments)    Nasal congestion  . Codeine Other (See Comments)    The patient passed out  . Hytrin [Terazosin] Other (See Comments)    Reaction not recalled   Family History  Problem Relation Age of Onset  . Hypertension Mother   . GER disease Mother   . Hyperlipidemia Father   . Hypertension Father   . Parkinson's disease Father   . Hypertension Brother   . Diabetes Maternal Grandmother   . Stroke Paternal Grandmother   . Diabetes Paternal Grandmother   . Stroke Paternal Grandfather   . Colon cancer Neg Hx   . Colon polyps Neg Hx   . Esophageal cancer Neg Hx   . Stomach cancer Neg Hx   . Rectal cancer Neg Hx    PE: BP 130/80   Pulse 86   Ht 5\' 10"  (1.778 m)   Wt 212 lb (96.2 kg)   BMI 30.42 kg/m  Wt Readings from Last 3 Encounters:  02/17/20 212 lb (96.2 kg)  01/29/20 213 lb (96.6 kg)  01/14/20 213 lb (96.6 kg)   Constitutional: overweight, in NAD Eyes: PERRLA, EOMI, no exophthalmos ENT: moist mucous membranes, no thyromegaly, no cervical lymphadenopathy Cardiovascular: RRR, No MRG Respiratory: CTA B  Gastrointestinal: abdomen soft, NT, ND, BS+ Musculoskeletal: no deformities, strength intact in all 4 Skin: moist, warm, no rashes Neurological: no tremor with outstretched hands, DTR normal in all 4, slightly  slurred speech but intelligible  ASSESSMENT: 1. DM2, insulin-dependent, uncontrolled, with long-term complications - CKD stage 3  PLAN:  1. Patient with long-standing, uncontrolled diabetes, on oral antidiabetic regimen and now also low dose long-acting insulin added after sugars increased while using dexamethasone.  He is currently on Tresiba 10 units daily.  However, he was able to stop the dexamethasone recently.  Sugars improved.  2 days ago he tried to change to [place] site from abdomen to upper arms and his sugars started to increase to the 200s after the change.  We discussed at this visit that ideally Tyler Aas is injecting a more central location like the abdomen or upper buttocks. -At this visit, we reviewed his diet and I made specific suggestions about the improvement.  He is drinking apple juice, which I strongly advised him to stop.  Also, he is eating potato chips between meals and he is doing this quite every day I advised him that any fried food and highly processed snacks will raise his blood sugars significantly >> strongly advised him to stop. -We also discussed about increasing the Metformin ER dose since he is tolerating this well and his kidney function allows it.  We will increase the dose to 1000 mg twice a day.  I believe that with increasing the Metformin dose and changing his diet, we are able to decrease his [place] to 8 units and I advised him to continue to decrease it if sugars stay controlled afterwards. -For now, we will continue the same dose of glipizide but at next visit I would like to at least reduce the dose -We also discussed about potentially switching to a GLP-1 receptor agonist.  Advised him to check with your insurance to see which ones are covered. - I suggested to:  Patient Instructions  Please increase: - Metformin ER to 1000 mg 2x a day, with meals  Please continue: - Glipizide 05-25-09 mg before meals  Please decrease: - Tresiba 8 units dail and  continue to decrease by 3-4 units every 4 days if sugars remain controlled.  Please move Tresiba back to abdomen.  Please stop: - juice - potato chips  Please check with your insurance if Ozempic or Trulicity are covered.  Please let me know if the sugars are consistently <80 or >200.  Please return in 3 months with your sugar log.   - Strongly advised him to start checking sugars at different times of the day - check 1-2x a day, rotating checks - discussed about CBG targets for treatment: 80-130 mg/dL before meals and <180 mg/dL after meals; target HbA1c <7%. - given sugar log and advised how to fill it and to bring it at next appt  - given foot care handout and explained the principles  - given instructions for hypoglycemia management "15-15 rule"  - advised for yearly eye exams  - Return to clinic in 3 mo with sugar log   Philemon Kingdom, MD PhD [facility]

## 2020-02-26 DIAGNOSIS — I129 Hypertensive chronic kidney disease with stage 1 through stage 4 chronic kidney disease, or unspecified chronic kidney disease: Secondary | ICD-10-CM | POA: Diagnosis not present

## 2020-02-26 DIAGNOSIS — T380X5D Adverse effect of glucocorticoids and synthetic analogues, subsequent encounter: Secondary | ICD-10-CM | POA: Diagnosis not present

## 2020-02-26 DIAGNOSIS — E1136 Type 2 diabetes mellitus with diabetic cataract: Secondary | ICD-10-CM | POA: Diagnosis not present

## 2020-02-26 DIAGNOSIS — N189 Chronic kidney disease, unspecified: Secondary | ICD-10-CM | POA: Diagnosis not present

## 2020-02-26 DIAGNOSIS — E1165 Type 2 diabetes mellitus with hyperglycemia: Secondary | ICD-10-CM | POA: Diagnosis not present

## 2020-02-26 DIAGNOSIS — E1122 Type 2 diabetes mellitus with diabetic chronic kidney disease: Secondary | ICD-10-CM | POA: Diagnosis not present

## 2020-03-02 ENCOUNTER — Other Ambulatory Visit: Payer: Self-pay

## 2020-03-02 ENCOUNTER — Encounter: Payer: Self-pay | Admitting: Internal Medicine

## 2020-03-02 ENCOUNTER — Ambulatory Visit (INDEPENDENT_AMBULATORY_CARE_PROVIDER_SITE_OTHER): Payer: Medicare Other | Admitting: Internal Medicine

## 2020-03-02 ENCOUNTER — Telehealth: Payer: Self-pay | Admitting: Internal Medicine

## 2020-03-02 VITALS — BP 124/80 | HR 80 | Temp 96.9°F | Resp 16 | Ht 70.5 in | Wt 211.2 lb

## 2020-03-02 DIAGNOSIS — M1A00X0 Idiopathic chronic gout, unspecified site, without tophus (tophi): Secondary | ICD-10-CM

## 2020-03-02 DIAGNOSIS — Z79899 Other long term (current) drug therapy: Secondary | ICD-10-CM

## 2020-03-02 DIAGNOSIS — I10 Essential (primary) hypertension: Secondary | ICD-10-CM | POA: Diagnosis not present

## 2020-03-02 DIAGNOSIS — N138 Other obstructive and reflux uropathy: Secondary | ICD-10-CM | POA: Diagnosis not present

## 2020-03-02 DIAGNOSIS — E1169 Type 2 diabetes mellitus with other specified complication: Secondary | ICD-10-CM

## 2020-03-02 DIAGNOSIS — E559 Vitamin D deficiency, unspecified: Secondary | ICD-10-CM | POA: Diagnosis not present

## 2020-03-02 DIAGNOSIS — Z136 Encounter for screening for cardiovascular disorders: Secondary | ICD-10-CM | POA: Diagnosis not present

## 2020-03-02 DIAGNOSIS — E1122 Type 2 diabetes mellitus with diabetic chronic kidney disease: Secondary | ICD-10-CM

## 2020-03-02 DIAGNOSIS — E785 Hyperlipidemia, unspecified: Secondary | ICD-10-CM | POA: Diagnosis not present

## 2020-03-02 DIAGNOSIS — Z125 Encounter for screening for malignant neoplasm of prostate: Secondary | ICD-10-CM | POA: Diagnosis not present

## 2020-03-02 DIAGNOSIS — E1121 Type 2 diabetes mellitus with diabetic nephropathy: Secondary | ICD-10-CM | POA: Diagnosis not present

## 2020-03-02 DIAGNOSIS — Z8249 Family history of ischemic heart disease and other diseases of the circulatory system: Secondary | ICD-10-CM

## 2020-03-02 DIAGNOSIS — N401 Enlarged prostate with lower urinary tract symptoms: Secondary | ICD-10-CM | POA: Diagnosis not present

## 2020-03-02 DIAGNOSIS — G4733 Obstructive sleep apnea (adult) (pediatric): Secondary | ICD-10-CM

## 2020-03-02 DIAGNOSIS — Z1212 Encounter for screening for malignant neoplasm of rectum: Secondary | ICD-10-CM

## 2020-03-02 DIAGNOSIS — N1831 Chronic kidney disease, stage 3a: Secondary | ICD-10-CM

## 2020-03-02 NOTE — Telephone Encounter (Signed)
Dr Cruzita Lederer, it looks like you wanted him to decrease dose of tresiba until he doesn't take it.  I just wanted to make sure before I call him.  Please review and verify instructions on Tresiba.  Thank you

## 2020-03-02 NOTE — Telephone Encounter (Signed)
M, please check with the patient, to see how many units is he getting now.  I did advise him to decrease the dose and stop if sugars remain controlled.  However, he may still be on few units of Tresiba now but I am not sure exactly how many.Marland KitchenMarland Kitchen

## 2020-03-02 NOTE — Telephone Encounter (Signed)
Patient called requesting Rx for tresiba and then a sample he got from his PCP for needles - called novofine plus 32g.  Patient ph# [phone]  Pharmacy - [facility] #[id number] - [place] [address], [place] - [address] Phone:  [phone]  Fax:  [phone]

## 2020-03-02 NOTE — Progress Notes (Signed)
Annual  Screening/Preventative Visit  & Comprehensive Evaluation & Examination     This very nice 74 y.o.  MWM presents for a  comprehensive evaluation and management of multiple medical co-morbidities.  Patient has been followed for HTN, HLD, T2_NIDDM  and Vitamin D Deficiency. Patient is on CPAP for OSA with improved restorative sleep.      On May 10, after a fall, patient underwent a craniotomy for a Frontal lobe Meningioma by Dr Ellene Route and seems to have recovered well. No hx/o seizures & Dr Ellene Route approved to resume previous activities including driving.       HTN predates since 57. Patient's BP has been controlled at home.  Today's BP is at goal -  124/80. Patient denies any cardiac symptoms as chest pain, palpitations, shortness of breath, dizziness or ankle swelling.     Patient's hyperlipidemia is controlled with diet and medications. Patient denies myalgias or other medication SE's. Last lipids were at goal except elevated Trigs's:  Lab Results  Component Value Date   CHOL 153 09/18/2019   HDL 30 (L) 09/18/2019   LDLCALC 90 09/18/2019   TRIG 236 (H) 09/18/2019   CHOLHDL 5.1 (H) 09/18/2019       Patient has hx/o preDM (2008) and then T2_NIDDM (2014) and started on insulin in May 2021 after adding Dexamethasone post op craniotomy and also has hx/o CKD3a (GFR 57).   Patient denies reactive hypoglycemic symptoms, visual blurring, diabetic polys or paresthesias. Last A1c was not at goal and patient was referred to [facility] for assistance in managing his Diabetes. Patient is tapering off [place] per Dr Cruzita Lederer.   Lab Results  Component Value Date   HGBA1C 8.1 (H) 12/29/2019        Finally, patient has history of Vitamin D Deficiency ("22" / 2008)  and last vitamin D was still low:  Lab Results  Component Value Date   VD25OH 37 09/18/2019    Current Outpatient Medications on File Prior to Visit  Medication Sig  . acetaminophen (TYLENOL) 650 MG CR tablet Take 650 mg by mouth  every 8 (eight) hours as needed for pain (or headaches).  . Cholecalciferol (VITAMIN D3) 10000 units capsule Take 10,000 Units by mouth at bedtime.   . citalopram (CELEXA) 10 MG tablet Take 1 tablet (10 mg total) by mouth daily.  . fenofibrate 160 MG tablet TAKE 1 TABLET BY MOUTH  DAILY FOR BLOOD FATS (Patient taking differently: Take 160 mg by mouth at bedtime. )  . fexofenadine (ALLEGRA) 180 MG tablet Take 180 mg by mouth daily as needed for allergies or rhinitis.  . fluticasone (FLONASE) 50 MCG/ACT nasal spray Use 2 sprays each nostril  /daily (Patient taking differently: Place 2 sprays into both nostrils daily as needed for allergies or rhinitis. )  . glipiZIDE (GLUCOTROL) 5 MG tablet Take 1 tablet 2 x   /day with Meals for Diabetes (Patient taking differently: Take 5 mg by mouth 2 (two) times daily with a meal. Patient states taking 2 tabs in the AM 1 Tab at lunch 2 tabs in the PM)  . glucose blood (FREESTYLE LITE) test strip Check blood sugar 1 time daily-[id number]  . Insulin Degludec (TRESIBA) 100 UNIT/ML SOLN Inject under skin 8 units daily  . metFORMIN (GLUCOPHAGE-XR) 500 MG 24 hr tablet Take 2 tablets (1,000 mg total) by mouth 2 (two) times daily with a meal.  . mupirocin ointment (BACTROBAN) 2 % APPLY TWICE A DAY TO SKIN INFECTION. (Patient taking differently: Apply 1  application topically 2 (two) times daily as needed (for cuts or abrasions- to heal). )  . omeprazole (PRILOSEC) 40 MG capsule TAKE 1 CAPSULE BY MOUTH  DAILY FOR ACID REFLUX (Patient taking differently: Take 40 mg by mouth at bedtime. )  . oxybutynin (DITROPAN) 5 MG tablet Take 1 tablet 3 x /day  For Bladder Control (Patient taking differently: Take 5 mg by mouth 3 (three) times daily. )  . polyethylene glycol (MIRALAX / GLYCOLAX) 17 g packet Take 17 g by mouth daily as needed for mild constipation.  . rosuvastatin (CRESTOR) 10 MG tablet Take 1 tablet Daily for Cholesterol  . tamsulosin (FLOMAX) 0.4 MG CAPS capsule Take 1  capsule (0.4 mg total) by mouth daily.  . valsartan-hydrochlorothiazide (DIOVAN-HCT) 320-25 MG tablet Take 1 tablet Daily for BP  . levETIRAcetam (KEPPRA) 500 MG tablet Take 1 tablet (500 mg total) by mouth 2 (two) times daily. (Patient not taking: Reported on 03/02/2020)   No current facility-administered medications on file prior to visit.   Allergies  Allergen Reactions  . Cardura [Doxazosin Mesylate] Other (See Comments)    Nasal congestion  . Codeine Other (See Comments)    The patient passed out  . Hytrin [Terazosin] Other (See Comments)    Reaction not recalled   Past Medical History:  Diagnosis Date  . Allergy   . Anxiety   . Cataract    beginning stage 01/22/2019  . Chronic kidney disease    enlarge bladder  . Diabetes mellitus without complication ([place])   . GERD (gastroesophageal reflux disease)   . Gout   . Hyperlipidemia   . Hypertension   . OSA (obstructive sleep apnea)   . Pre-diabetes   . Sleep apnea    c pap   Health Maintenance  Topic Date Due  . Hepatitis C Screening  Never done  . OPHTHALMOLOGY EXAM  09/11/2019  . INFLUENZA VACCINE  03/21/2020  . HEMOGLOBIN A1C  06/30/2020  . FOOT EXAM  03/02/2021  . COLONOSCOPY  02/02/2022  . TETANUS/TDAP  05/31/2025  . COVID-19 Vaccine  Completed  . PNA vac Low Risk Adult  Completed   Immunization History  Administered Date(s) Administered  . DT (Pediatric) 06/01/2015  . Influenza Whole 06/05/2013  . Influenza, High Dose Seasonal PF 06/05/2014, 06/01/2015, 06/14/2016, 06/22/2017, 06/11/2018, 05/13/2019  . PFIZER SARS-COV-2 Vaccination 09/27/2019, 10/20/2019  . Pneumococcal Conjugate-13 08/19/2014  . Pneumococcal Polysaccharide-23 08/22/2003, 12/10/2015  . Tdap 03/08/2004  . Zoster 08/18/2013   Last Colon - 02/03/2019 - Dr Ardis Hughs - Recc 3 year f/u due Mar 2023  Past Surgical History:  Procedure Laterality Date  . COLONOSCOPY    . CRANIOTOMY Right 12/29/2019   Procedure: Craniotomy for Tumor;  Surgeon:  Kristeen Miss, MD;  Location: [place];  Service: Neurosurgery;  Laterality: Right;  Craniotomy for Tumor Excision  . FOOT SURGERY  10/2009  . MYRINGOTOMY Left 02-2005   Dr.Newman  . TONSILLECTOMY  1953   Family History  Problem Relation Age of Onset  . Hypertension Mother   . GER disease Mother   . Hyperlipidemia Father   . Hypertension Father   . Parkinson's disease Father   . Hypertension Brother   . Diabetes Maternal Grandmother   . Stroke Paternal Grandmother   . Diabetes Paternal Grandmother   . Stroke Paternal Grandfather   . Colon cancer Neg Hx   . Colon polyps Neg Hx   . Esophageal cancer Neg Hx   . Stomach cancer Neg Hx   .  Rectal cancer Neg Hx    Social History   Socioeconomic History  . Marital status: Married    Spouse name: Vickii Chafe (age 12 yo)   . Number of children:  1 adopted daughter    Occupational History  . 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 Smoking status: Never Smoker  . Smokeless tobacco: Never Used  Vaping Use  . Vaping Use: Never used  Substance and Sexual Activity  . Alcohol use: No  . Drug use: No  . Sexual activity: Not on file   ROS Constitutional: Denies fever, chills, weight loss/gain, headaches, insomnia,  night sweats or change in appetite. Does c/o fatigue. Eyes: Denies redness, blurred vision, diplopia, discharge, itchy or watery eyes.  ENT: Denies discharge, congestion, post nasal drip, epistaxis, sore throat, earache, hearing loss, dental pain, Tinnitus, Vertigo, Sinus pain or snoring.  Cardio: Denies chest pain, palpitations, irregular heartbeat, syncope, dyspnea, diaphoresis, orthopnea, PND, claudication or edema Respiratory: denies cough, dyspnea, DOE, pleurisy, hoarseness, laryngitis or wheezing.  Gastrointestinal: Denies dysphagia, heartburn, reflux, water brash, pain, cramps, nausea, vomiting, bloating, diarrhea, constipation, hematemesis, melena, hematochezia,  jaundice or hemorrhoids Genitourinary: Denies dysuria, frequency, discharge, hematuria or flank pain. Has urgency, nocturia x 2-3 & occasional hesitancy. Musculoskeletal: Denies arthralgia, myalgia, stiffness, Jt. Swelling, pain, limp or strain/sprain. Denies Falls. Skin: Denies puritis, rash, hives, warts, acne, eczema or change in skin lesion Neuro: No weakness, tremor, incoordination, spasms, paresthesia or pain Psychiatric: Denies confusion, memory loss or sensory loss. Denies Depression. Endocrine: Denies change in weight, skin, hair change, nocturia, and paresthesia, diabetic polys, visual blurring or hyper / hypo glycemic episodes.  Heme/Lymph: No excessive bleeding, bruising or enlarged lymph nodes.  Physical Exam  BP 124/80   Pulse 80   Temp (!) 96.9 F (36.1 C)   Resp 16   Ht 5' 10.5" (1.791 m)   Wt 211 lb 3.2 oz (95.8 kg)   BMI 29.88 kg/m   General Appearance: Over nourished and well groomed and in no apparent distress.  Eyes: PERRLA, EOMs, conjunctiva no swelling or erythema, normal fundi and vessels. Sinuses: No frontal/maxillary tenderness ENT/Mouth: EACs patent / TMs  nl. Nares clear without erythema, swelling, mucoid exudates. Oral hygiene is good. No erythema, swelling, or exudate. Tongue normal, non-obstructing. Tonsils not swollen or erythematous. Hearing normal.  Neck: Supple, thyroid not palpable. No bruits, nodes or JVD. Respiratory: Respiratory effort normal.  BS equal and clear bilateral without rales, rhonci, wheezing or stridor. Cardio: Heart sounds are normal with regular rate and rhythm and no murmurs, rubs or gallops. Peripheral pulses are normal and equal bilaterally without edema. No aortic or femoral bruits. Chest: symmetric with normal excursions and percussion.  Abdomen: Soft, with Nl bowel sounds. Nontender, no guarding, rebound, hernias, masses, or organomegaly.  Lymphatics: Non tender without lymphadenopathy.  Musculoskeletal: Full ROM all  peripheral extremities, joint stability, 5/5 strength, and normal gait. Skin: Warm and dry without rashes, lesions, cyanosis, clubbing or  ecchymosis.  Neuro: Cranial nerves intact, reflexes equal bilaterally. Normal muscle tone, no cerebellar symptoms. Sensation intact.  Pysch: Alert and oriented X 3 with normal affect, insight and judgment appropriate.   Assessment and Plan  1. Essential hypertension  - EKG 12-Lead - [place], RETROPERITNL ABD,  LTD - Urinalysis, Routine w reflex microscopic - Microalbumin / creatinine urine ratio - CBC with Differential/Platelet - COMPLETE METABOLIC PANEL WITH GFR - Magnesium - TSH  2. Hyperlipidemia associated with  type 2 diabetes mellitus (HCC)  - EKG 12-Lead - [place], RETROPERITNL ABD,  LTD - Lipid panel - TSH  3. Type 2 diabetes mellitus with stage 3a chronic kidney disease, without long-term current use of insulin (HCC)  - EKG 12-Lead - [place], RETROPERITNL ABD,  LTD - Urinalysis, Routine w reflex microscopic - Microalbumin / creatinine urine ratio - HM DIABETES FOOT EXAM - LOW EXTREMITY NEUR EXAM DOCUM - Hemoglobin A1c  4. Vitamin D deficiency  - VITAMIN D 25 Hydroxy  5. OSA on CPAP   6. BPH with obstruction/lower urinary tract symptoms  - Urinalysis, Routine w reflex microscopic - PSA  7. Idiopathic chronic gout   - Uric acid  8. Prostate cancer screening  - PSA  9. Screening for colorectal cancer  - POC Hemoccult Bld/Stl   10. Screening for ischemic heart disease  - EKG 12-Lead  11. FHx: heart disease  - EKG 12-Lead - [place], RETROPERITNL ABD,  LTD  12. Screening for AAA (aortic abdominal aneurysm)  - [place], RETROPERITNL ABD,  LTD  13. Medication management  - Urinalysis, Routine w reflex microscopic - Microalbumin / creatinine urine ratio - CBC with Differential/Platelet - COMPLETE METABOLIC PANEL WITH GFR - Magnesium - Lipid panel - TSH - Hemoglobin A1c - VITAMIN D 25 Hydroxy          Patient was counseled  in prudent diet, weight control to achieve/maintain BMI less than 25, BP monitoring, regular exercise and medications as discussed.  Discussed med effects and SE's. Routine screening labs and tests as requested with regular follow-up as recommended. Over 40 minutes of exam, counseling, chart review and high complex critical decision making was performed   Kirtland Bouchard, MD

## 2020-03-02 NOTE — Patient Instructions (Signed)

## 2020-03-03 DIAGNOSIS — E1136 Type 2 diabetes mellitus with diabetic cataract: Secondary | ICD-10-CM | POA: Diagnosis not present

## 2020-03-03 DIAGNOSIS — E1122 Type 2 diabetes mellitus with diabetic chronic kidney disease: Secondary | ICD-10-CM | POA: Diagnosis not present

## 2020-03-03 DIAGNOSIS — I129 Hypertensive chronic kidney disease with stage 1 through stage 4 chronic kidney disease, or unspecified chronic kidney disease: Secondary | ICD-10-CM | POA: Diagnosis not present

## 2020-03-03 DIAGNOSIS — N189 Chronic kidney disease, unspecified: Secondary | ICD-10-CM | POA: Diagnosis not present

## 2020-03-03 DIAGNOSIS — T380X5D Adverse effect of glucocorticoids and synthetic analogues, subsequent encounter: Secondary | ICD-10-CM | POA: Diagnosis not present

## 2020-03-03 DIAGNOSIS — E1165 Type 2 diabetes mellitus with hyperglycemia: Secondary | ICD-10-CM | POA: Diagnosis not present

## 2020-03-03 LAB — VITAMIN D 25 HYDROXY (VIT D DEFICIENCY, FRACTURES): Vit D, 25-Hydroxy: 46 ng/mL (ref 30–100)

## 2020-03-03 LAB — LIPID PANEL
Cholesterol: 150 mg/dL (ref <200)
HDL: 28 mg/dL — ABNORMAL LOW (ref >=40)
LDL Cholesterol (Calc): 84 mg/dL (calc)
Non-HDL Cholesterol (Calc): 122 mg/dL (calc) (ref <130)
Total CHOL/HDL Ratio: 5.4 (calc) — ABNORMAL HIGH (ref <5.0)
Triglycerides: 287 mg/dL — ABNORMAL HIGH (ref <150)

## 2020-03-03 LAB — PSA: PSA: 2.7 ng/mL (ref <=4.0)

## 2020-03-03 LAB — HEMOGLOBIN A1C
Hgb A1c MFr Bld: 6.3 % of total Hgb — ABNORMAL HIGH (ref <5.7)
Mean Plasma Glucose: 134 (calc)
eAG (mmol/L): 7.4 (calc)

## 2020-03-03 LAB — COMPLETE METABOLIC PANEL WITH GFR
AG Ratio: 1.9 (calc) (ref 1.0–2.5)
ALT: 14 U/L (ref 9–46)
AST: 20 U/L (ref 10–35)
Albumin: 4.5 g/dL (ref 3.6–5.1)
Alkaline phosphatase (APISO): 48 U/L (ref 35–144)
BUN/Creatinine Ratio: 19 (calc) (ref 6–22)
BUN: 29 mg/dL — ABNORMAL HIGH (ref 7–25)
CO2: 26 mmol/L (ref 20–32)
Calcium: 10.3 mg/dL (ref 8.6–10.3)
Chloride: 105 mmol/L (ref 98–110)
Creat: 1.55 mg/dL — ABNORMAL HIGH (ref 0.70–1.18)
GFR, Est African American: 51 mL/min/{1.73_m2} — ABNORMAL LOW (ref >=60)
GFR, Est Non African American: 44 mL/min/{1.73_m2} — ABNORMAL LOW (ref >=60)
Globulin: 2.4 g/dL (calc) (ref 1.9–3.7)
Glucose, Bld: 78 mg/dL (ref 65–99)
Potassium: 4.2 mmol/L (ref 3.5–5.3)
Sodium: 144 mmol/L (ref 135–146)
Total Bilirubin: 0.5 mg/dL (ref 0.2–1.2)
Total Protein: 6.9 g/dL (ref 6.1–8.1)

## 2020-03-03 LAB — URINALYSIS, ROUTINE W REFLEX MICROSCOPIC
Bilirubin Urine: NEGATIVE
Glucose, UA: NEGATIVE
Hgb urine dipstick: NEGATIVE
Ketones, ur: NEGATIVE
Leukocytes,Ua: NEGATIVE
Nitrite: NEGATIVE
Protein, ur: NEGATIVE
Specific Gravity, Urine: 1.02 (ref 1.001–1.03)
pH: <=5 (ref 5.0–8.0)

## 2020-03-03 LAB — CBC WITH DIFFERENTIAL/PLATELET
Absolute Monocytes: 744 cells/uL (ref 200–950)
Basophils Absolute: 32 cells/uL (ref 0–200)
Basophils Relative: 0.4 %
Eosinophils Absolute: 264 cells/uL (ref 15–500)
Eosinophils Relative: 3.3 %
HCT: 41.4 % (ref 38.5–50.0)
Hemoglobin: 13.7 g/dL (ref 13.2–17.1)
Lymphs Abs: 1888 cells/uL (ref 850–3900)
MCH: 28.9 pg (ref 27.0–33.0)
MCHC: 33.1 g/dL (ref 32.0–36.0)
MCV: 87.3 fL (ref 80.0–100.0)
MPV: 10.3 fL (ref 7.5–12.5)
Monocytes Relative: 9.3 %
Neutro Abs: 5072 cells/uL (ref 1500–7800)
Neutrophils Relative %: 63.4 %
Platelets: 218 10*3/uL (ref 140–400)
RBC: 4.74 10*6/uL (ref 4.20–5.80)
RDW: 13.1 % (ref 11.0–15.0)
Total Lymphocyte: 23.6 %
WBC: 8 10*3/uL (ref 3.8–10.8)

## 2020-03-03 LAB — URIC ACID: Uric Acid, Serum: 6.6 mg/dL (ref 4.0–8.0)

## 2020-03-03 LAB — MICROALBUMIN / CREATININE URINE RATIO
Creatinine, Urine: 148 mg/dL (ref 20–320)
Microalb Creat Ratio: 6 mcg/mg creat (ref <30)
Microalb, Ur: 0.9 mg/dL

## 2020-03-03 LAB — MAGNESIUM: Magnesium: 1.8 mg/dL (ref 1.5–2.5)

## 2020-03-03 LAB — TSH: TSH: 2.43 mIU/L (ref 0.40–4.50)

## 2020-03-03 MED ORDER — NOVOFINE PLUS 32G X 4 MM MISC: 0 refills | Status: DC

## 2020-03-03 MED ORDER — TRESIBA FLEXTOUCH 100 UNIT/ML ~~LOC~~ SOPN: PEN_INJECTOR | SUBCUTANEOUS | 0 refills | Status: DC

## 2020-03-03 NOTE — Addendum Note (Signed)
Addended by: Cardell Peach I on: 03/03/2020 11:37 AM   Modules accepted: Orders

## 2020-03-03 NOTE — Progress Notes (Signed)
============================================================  -   PSA - Low - Great  ============================================================  - Total Chol = 154  & LDL Chol = 84 - Great  ============================================================  - But Triglycerides (   287   ) or fats in blood are too high  (goal is less than 150)    - Recommend avoid fried & greasy foods,  sweets / candy,   - Avoid white rice  (brown or wild rice or Quinoa is OK),   - Avoid white potatoes  (sweet potatoes are OK)   - Avoid anything made from white flour  - bagels, doughnuts, rolls, buns, biscuits, white and   wheat breads, pizza crust and traditional  pasta made of white flour & egg white  - (vegetarian pasta or spinach or wheat pasta is OK).    - Multi-grain bread is OK - like multi-grain flat bread or  sandwich thins.   - Avoid alcohol in excess.   - Exercise is also important. ============================================================  - A1c - Better - down from 8.1% to 6.3% - Continue taper Insulin as  recommended per Dr Cruzita Lederer ============================================================  - Vitamin D = 46 - Low   - - Vitamin D goal is between 70-100.   - Please make sure that you are taking your  Vitamin D 10,000 units /day  as directed.   - It is very important as a natural anti-inflammatory and helping the  immune system protect against viral infections, like the Covid-19    helping hair, skin, and nails, as well as reducing stroke and heart attack risk.   - It helps your bones and helps with mood.  - It also decreases numerous cancer risks so please take it as directed.   - Low Vit D is associated with a 200-300% higher risk for CANCER   and 200-300% higher risk for HEART   ATTACK  &  STROKE.    - It is also associated with higher death rate at younger ages,   autoimmune diseases like Rheumatoid arthritis, Lupus, Multiple Sclerosis.     - Also many  other serious conditions, like depression, Alzheimer's  Dementia, infertility, muscle aches, fatigue, fibromyalgia - just to name a few.  ===========================================================  -  All Else - CBC - Kidneys - Electrolytes - Liver - Magnesium & Thyroid    - all  Normal / OK ====================================================

## 2020-03-05 ENCOUNTER — Other Ambulatory Visit: Payer: Self-pay | Admitting: Internal Medicine

## 2020-03-05 DIAGNOSIS — I10 Essential (primary) hypertension: Secondary | ICD-10-CM

## 2020-03-05 MED ORDER — VALSARTAN-HYDROCHLOROTHIAZIDE 320-25 MG PO TABS: ORAL_TABLET | ORAL | 0 refills | Status: DC

## 2020-03-05 MED ORDER — CITALOPRAM HYDROBROMIDE 20 MG PO TABS: ORAL_TABLET | ORAL | 0 refills | Status: DC

## 2020-03-09 ENCOUNTER — Other Ambulatory Visit: Payer: Self-pay | Admitting: Internal Medicine

## 2020-03-09 DIAGNOSIS — N183 Chronic kidney disease, stage 3 unspecified: Secondary | ICD-10-CM

## 2020-03-09 MED ORDER — GLIPIZIDE 5 MG PO TABS: ORAL_TABLET | ORAL | 0 refills | Status: DC

## 2020-03-11 DIAGNOSIS — E1165 Type 2 diabetes mellitus with hyperglycemia: Secondary | ICD-10-CM | POA: Diagnosis not present

## 2020-03-11 DIAGNOSIS — T380X5D Adverse effect of glucocorticoids and synthetic analogues, subsequent encounter: Secondary | ICD-10-CM | POA: Diagnosis not present

## 2020-03-11 DIAGNOSIS — E1136 Type 2 diabetes mellitus with diabetic cataract: Secondary | ICD-10-CM | POA: Diagnosis not present

## 2020-03-11 DIAGNOSIS — E1122 Type 2 diabetes mellitus with diabetic chronic kidney disease: Secondary | ICD-10-CM | POA: Diagnosis not present

## 2020-03-11 DIAGNOSIS — I129 Hypertensive chronic kidney disease with stage 1 through stage 4 chronic kidney disease, or unspecified chronic kidney disease: Secondary | ICD-10-CM | POA: Diagnosis not present

## 2020-03-11 DIAGNOSIS — N189 Chronic kidney disease, unspecified: Secondary | ICD-10-CM | POA: Diagnosis not present

## 2020-03-17 ENCOUNTER — Other Ambulatory Visit: Payer: Self-pay | Admitting: Internal Medicine

## 2020-03-17 DIAGNOSIS — N3281 Overactive bladder: Secondary | ICD-10-CM

## 2020-03-18 ENCOUNTER — Other Ambulatory Visit: Payer: Self-pay

## 2020-03-18 DIAGNOSIS — E1122 Type 2 diabetes mellitus with diabetic chronic kidney disease: Secondary | ICD-10-CM

## 2020-03-18 MED ORDER — BD PEN NEEDLE MICRO U/F 32G X 6 MM MISC: 1.0000 | Freq: Every day | 2 refills | Status: DC

## 2020-03-19 ENCOUNTER — Telehealth: Payer: Self-pay

## 2020-03-19 NOTE — Telephone Encounter (Signed)
Received notification from [facility] that PA is required for [place]. However, pt has Medicare. Faxed [facility] asking that they provide the following information to proceed with PA:   BIN  PCN  Rc Grp#  Specific Medicare Rx benefit provider  PA remains pending until this information has been received by [facility].

## 2020-03-22 ENCOUNTER — Telehealth: Payer: Self-pay

## 2020-03-22 NOTE — Telephone Encounter (Signed)
Received PA request from [facility] for Tresiba but failed to provide the following information in order to proceed with PA:   BIN  PCN  Rx grp  Specific Medicare Rx benefit provider  Returned fax advising that they provide this information so that we may proceed with PA. PA remains on hold pending this information. Confirmation received.

## 2020-03-31 ENCOUNTER — Telehealth: Payer: Self-pay | Admitting: Internal Medicine

## 2020-03-31 NOTE — Telephone Encounter (Signed)
How much Tyler Aas is he taking?  Is he taking any NovoLog now?

## 2020-03-31 NOTE — Telephone Encounter (Signed)
Patient called to advise that Jeffery Reed is $600+  - Patient is requesting that we call in either  Novolog or Novolin to [facility] on [address] [place]

## 2020-03-31 NOTE — Telephone Encounter (Signed)
Dr. Cruzita Lederer is out of clinic until next Monday.  Please advise if patient can change to either of the insulin he listed below.

## 2020-03-31 NOTE — Telephone Encounter (Signed)
Left message for patient to return our call at [phone].  

## 2020-03-31 NOTE — Telephone Encounter (Signed)
He can use Novolin N 4 units twice a day.  Does not need NovoLog unless he is already taking it.  May need 0.3 cc insulin syringes also for the new insulin

## 2020-03-31 NOTE — Telephone Encounter (Signed)
Patient was started on 10 units Tresiba by his PCP before establishing care with Dr. Cruzita Lederer. Patient has not taken any other insulins.  Dr. Cruzita Lederer had patient decrease Tresiba at last OV in June to 8 units.

## 2020-04-01 ENCOUNTER — Telehealth: Payer: Self-pay | Admitting: *Deleted

## 2020-04-01 NOTE — Telephone Encounter (Signed)
FMLA forms faxed to [facility], [place].

## 2020-04-02 DIAGNOSIS — N1832 Chronic kidney disease, stage 3b: Secondary | ICD-10-CM | POA: Diagnosis not present

## 2020-04-02 DIAGNOSIS — R778 Other specified abnormalities of plasma proteins: Secondary | ICD-10-CM | POA: Diagnosis not present

## 2020-04-02 NOTE — Telephone Encounter (Signed)
SECOND ATTEMPT: ° °LVM requesting returned call. °

## 2020-04-05 ENCOUNTER — Other Ambulatory Visit: Payer: Self-pay | Admitting: Internal Medicine

## 2020-04-05 DIAGNOSIS — E782 Mixed hyperlipidemia: Secondary | ICD-10-CM

## 2020-04-06 DIAGNOSIS — N281 Cyst of kidney, acquired: Secondary | ICD-10-CM | POA: Diagnosis not present

## 2020-04-06 DIAGNOSIS — E559 Vitamin D deficiency, unspecified: Secondary | ICD-10-CM | POA: Diagnosis not present

## 2020-04-06 DIAGNOSIS — I129 Hypertensive chronic kidney disease with stage 1 through stage 4 chronic kidney disease, or unspecified chronic kidney disease: Secondary | ICD-10-CM | POA: Diagnosis not present

## 2020-04-06 DIAGNOSIS — N400 Enlarged prostate without lower urinary tract symptoms: Secondary | ICD-10-CM | POA: Diagnosis not present

## 2020-04-06 DIAGNOSIS — N1832 Chronic kidney disease, stage 3b: Secondary | ICD-10-CM | POA: Diagnosis not present

## 2020-04-06 DIAGNOSIS — R778 Other specified abnormalities of plasma proteins: Secondary | ICD-10-CM | POA: Diagnosis not present

## 2020-04-07 ENCOUNTER — Telehealth: Payer: Self-pay | Admitting: Internal Medicine

## 2020-04-07 MED ORDER — LANTUS SOLOSTAR 100 UNIT/ML ~~LOC~~ SOPN: 8.0000 [IU] | PEN_INJECTOR | Freq: Every day | SUBCUTANEOUS | 1 refills | Status: DC

## 2020-04-07 NOTE — Telephone Encounter (Signed)
We decreased the dose of Tresiba at last visit and I advised him to taper it down.  Is he is still taking any?  If so, we can definitely switch to Lantus same dose, at bedtime.

## 2020-04-07 NOTE — Telephone Encounter (Signed)
Medication Refill Request  . Did you call your pharmacy and request this refill first? Yes  . If patient has not contacted pharmacy first, instruct them to do so for future refills.  . Remind them that contacting the pharmacy for their refill is the quickest method to get the refill.  . Refill policy also stated that it will take anywhere between 24-72 hours to receive the refill.    Name of medication? Lantus (replacing Tyler Aas because of cost)  Is this a 90 day supply? Patient requested 30 day to the local pharmacy [facility] -see below) and 90 day to [facility] for cost savings.  Name and location of pharmacy? [facility] in [place]

## 2020-04-07 NOTE — Telephone Encounter (Signed)
RX for Lantus sent.

## 2020-04-07 NOTE — Telephone Encounter (Signed)
Patient asking to change to Lantus. Please advise.

## 2020-04-08 ENCOUNTER — Other Ambulatory Visit: Payer: Self-pay | Admitting: Neurological Surgery

## 2020-04-08 DIAGNOSIS — D320 Benign neoplasm of cerebral meninges: Secondary | ICD-10-CM

## 2020-04-16 ENCOUNTER — Other Ambulatory Visit: Payer: Self-pay | Admitting: Physician Assistant

## 2020-04-16 ENCOUNTER — Other Ambulatory Visit: Payer: Self-pay | Admitting: Adult Health

## 2020-04-16 MED ORDER — CITALOPRAM HYDROBROMIDE 20 MG PO TABS: ORAL_TABLET | ORAL | 1 refills | Status: DC

## 2020-04-27 ENCOUNTER — Other Ambulatory Visit: Payer: Self-pay | Admitting: Neurological Surgery

## 2020-04-27 DIAGNOSIS — D320 Benign neoplasm of cerebral meninges: Secondary | ICD-10-CM

## 2020-04-29 ENCOUNTER — Ambulatory Visit: Payer: Medicare Other

## 2020-04-29 ENCOUNTER — Ambulatory Visit
Admission: RE | Admit: 2020-04-29 | Discharge: 2020-04-29 | Disposition: A | Discharge status: Home / Self Care | Payer: Medicare Other | Source: Ambulatory Visit | Attending: Neurological Surgery | Admitting: Neurological Surgery

## 2020-04-29 ENCOUNTER — Other Ambulatory Visit: Payer: Self-pay

## 2020-04-29 DIAGNOSIS — D320 Benign neoplasm of cerebral meninges: Secondary | ICD-10-CM | POA: Diagnosis not present

## 2020-04-29 DIAGNOSIS — D329 Benign neoplasm of meninges, unspecified: Secondary | ICD-10-CM | POA: Diagnosis not present

## 2020-04-29 DIAGNOSIS — R9082 White matter disease, unspecified: Secondary | ICD-10-CM | POA: Diagnosis not present

## 2020-04-29 DIAGNOSIS — J3489 Other specified disorders of nose and nasal sinuses: Secondary | ICD-10-CM | POA: Diagnosis not present

## 2020-04-29 DIAGNOSIS — G9389 Other specified disorders of brain: Secondary | ICD-10-CM | POA: Diagnosis not present

## 2020-04-29 IMAGING — MR MR HEAD WO/W CM
13 series · 48 of 48 positions shown · IV contrast (gadavist)
Comparison: [DATE]

CLINICAL DATA: Meningioma post resection [DATE]

EXAM:
MRI HEAD WITHOUT AND WITH CONTRAST
TECHNIQUE: Multiplanar, multiecho pulse sequences of the brain and surrounding
structures were obtained without and with intravenous contrast.
CONTRAST:  9mL GADAVIST GADOBUTROL 1 MMOL/ML IV SOLN

[Series 5: ax dwi_tracew · axial · 3.0mm · 0.60mm/px · z∈[-70,+83]mm · 2 of 48 slices shown]
[im 1/48]
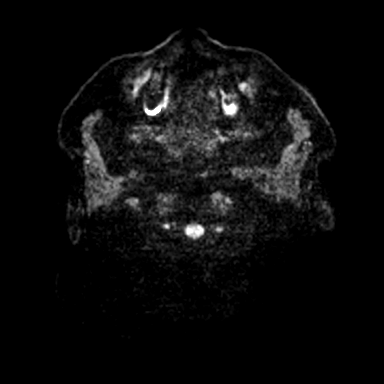
[im 48/48]
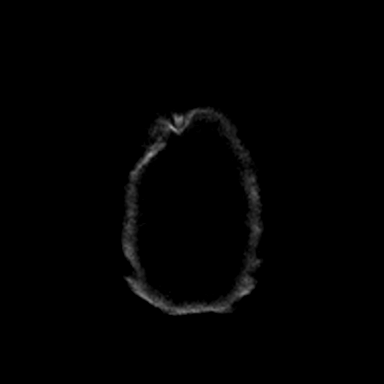

[Series 6: ax dwi_adc · axial · 3.0mm · 0.60mm/px · z∈[-70,+83]mm · 3 of 48 slices shown]
[im 1/48]
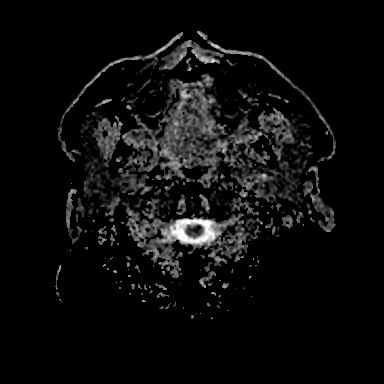
[im 24/48]
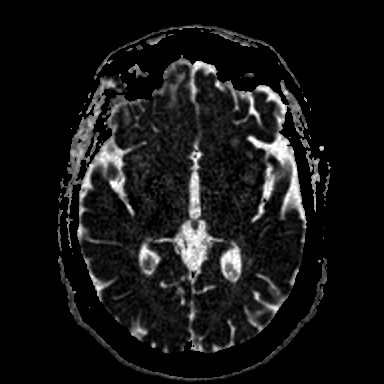
[im 48/48]
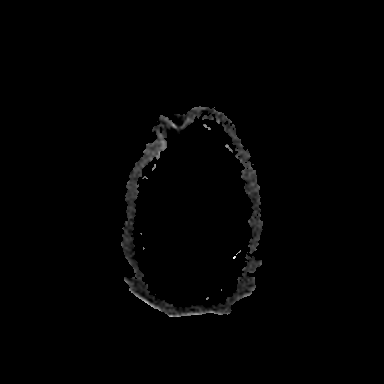

[Series 7: cor dwi_tracew · coronal · 5.0mm · 0.60mm/px · 2 of 36 slices shown]
[im 1/36]
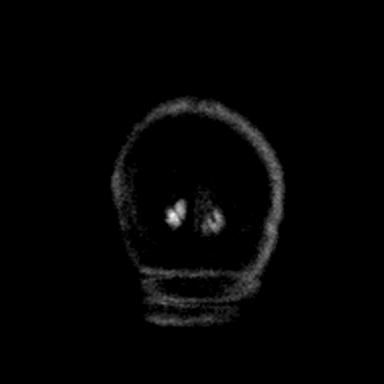
[im 36/36]
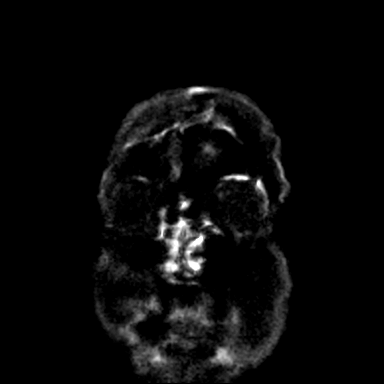

[Series 8: cor dwi_adc · coronal · 5.0mm · 0.60mm/px · 2 of 36 slices shown]
[im 1/36]
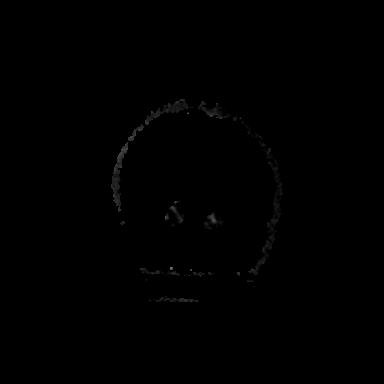
[im 36/36]
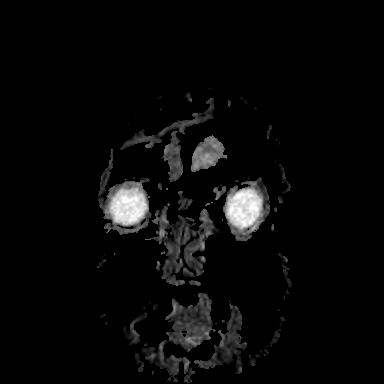

[Series 9: T1 · sagittal · 5.0mm · 0.62mm/px · 1 of 23 slices shown (1 of 2)]
[im 1/23]
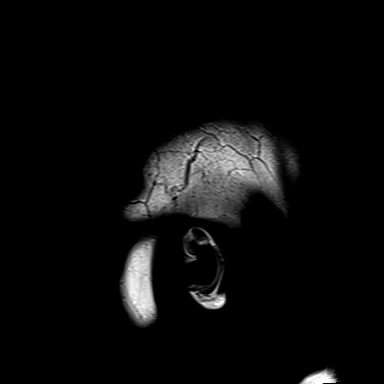

[Series 11: mag_images · axial · 3.0mm · 0.90mm/px · z∈[-87,+89]mm · 4 of 60 slices shown]
[im 1/60]
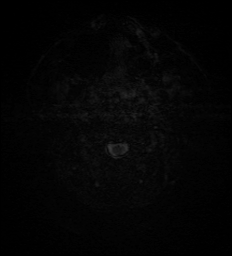
[im 20/60]
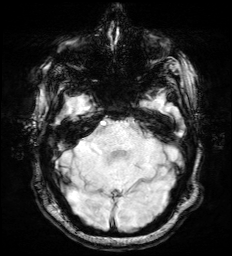
[im 40/60]
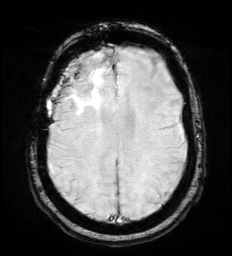
[im 60/60]
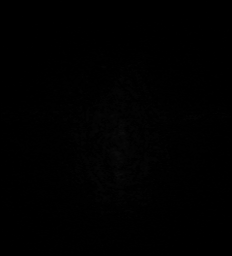

[Series 12: pha_images · axial · 3.0mm · 0.90mm/px · z∈[-84,+89]mm · 4 of 59 slices shown]
[im 1/59]
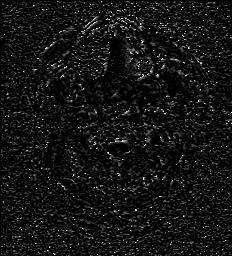
[im 20/59]
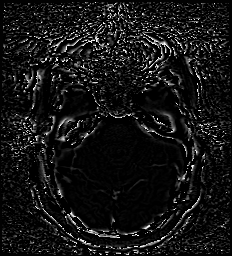
[im 39/59]
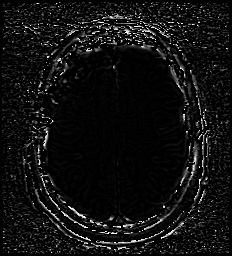
[im 59/59]
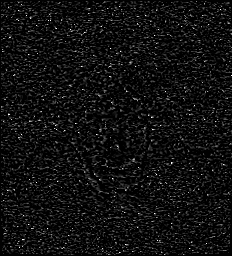

[Series 13: swi_images · axial · 3.0mm · 0.90mm/px · z∈[-87,+89]mm · 4 of 60 slices shown]
[im 1/60]
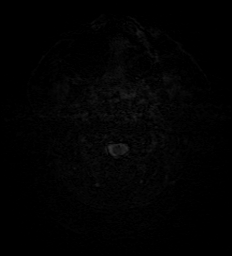
[im 20/60]
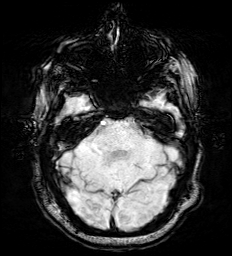
[im 40/60]
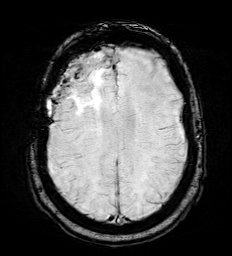
[im 60/60]
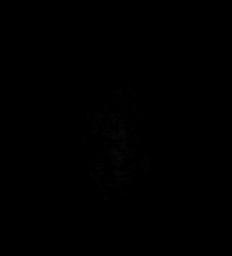

[Series 15: FLAIR · axial · 3.0mm · 0.53mm/px · z∈[-80,+81]mm · 3 of 55 slices shown]
[im 1/55]
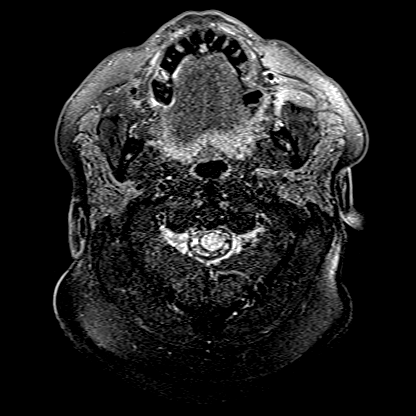
[im 28/55]
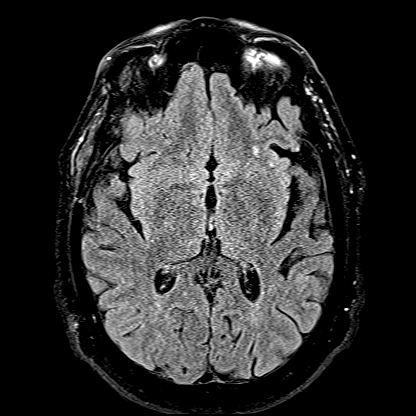
[im 55/55]
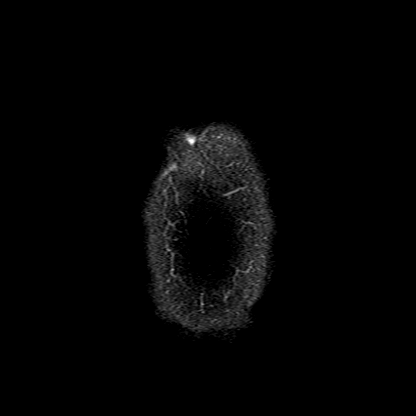

[Series 16: T1 · axial · 1.0mm · 0.98mm/px · z∈[-77,+81]mm · 10 of 160 slices shown (2 of 2)]
[im 1/160]
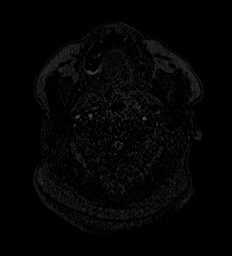
[im 18/160]
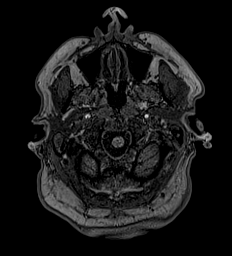
[im 36/160]
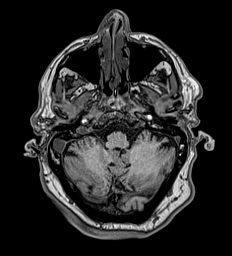
[im 54/160]
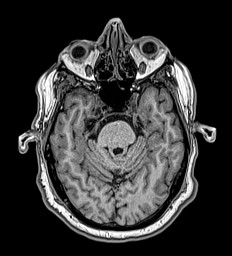
[im 71/160]
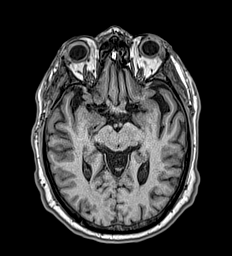
[im 89/160]
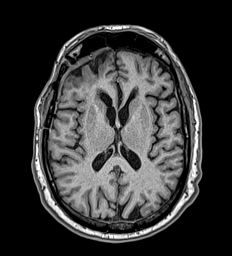
[im 107/160]
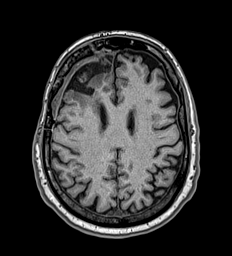
[im 124/160]
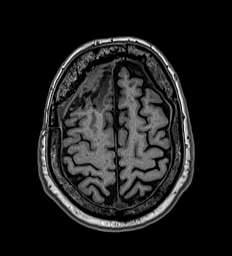
[im 142/160]
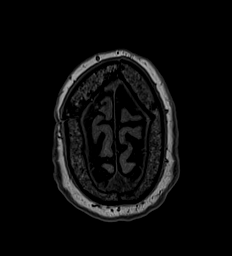
[im 160/160]
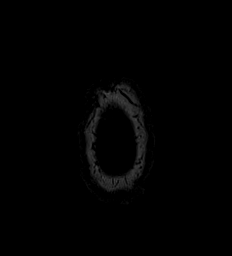

[Series 18: T1 post-contrast · axial · 1.0mm · 0.98mm/px · z∈[-77,+81]mm · 10 of 160 slices shown (1 of 3)]
[im 1/160]
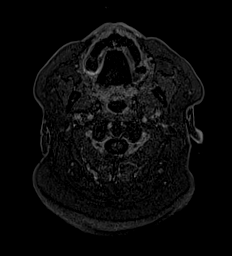
[im 18/160]
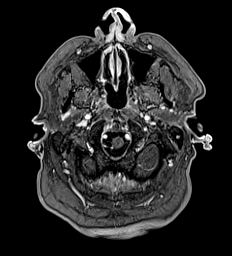
[im 36/160]
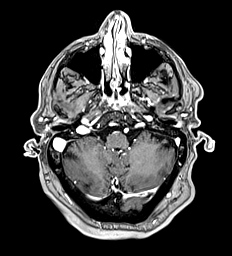
[im 54/160]
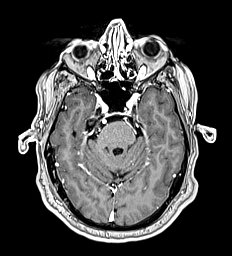
[im 71/160]
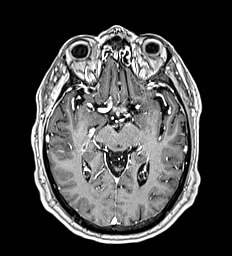
[im 89/160]
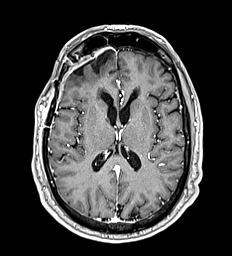
[im 107/160]
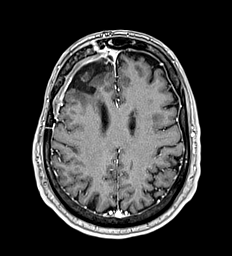
[im 124/160]
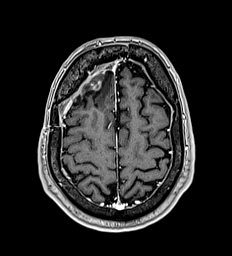
[im 142/160]
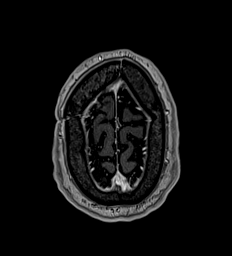
[im 160/160]
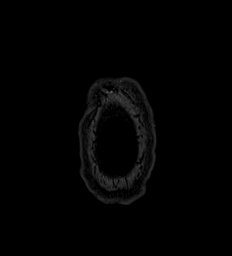

[Series 19: T1 post-contrast · coronal · 5.0mm · 0.57mm/px · 2 of 29 slices shown (2 of 3)]
[im 1/29]
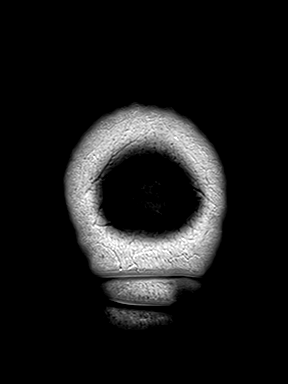
[im 29/29]
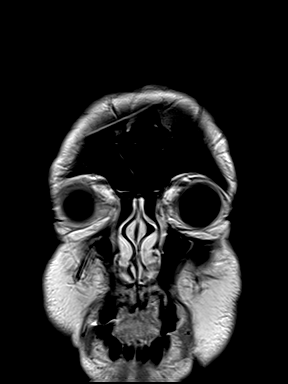

[Series 20: T1 post-contrast · sagittal · 5.0mm · 0.62mm/px · 1 of 23 slices shown (3 of 3)]
[im 1/23]
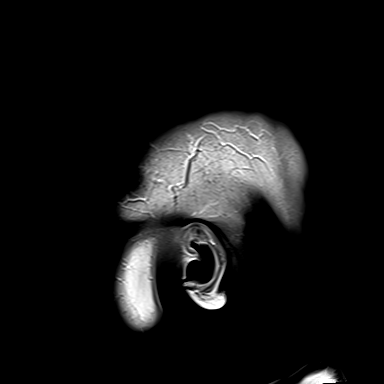

[48 of 48 positions shown; findings below may reference images not displayed]

FINDINGS: Brain: Evolution of postoperative changes since the prior study.
There is a persistent extra-axial collection underlying the
craniotomy measuring up to 7 mm in thickness. Underlying resection
cavity along the right frontal convexity has contracted with minimal
curvilinear, likely postoperative enhancement. There is no nodular
enhancement to suggest residual/recurrent tumor.
Encephalomalacia/gliosis of the adjacent right frontal lobe.
Susceptibility in this region reflects hemosiderin deposition.

No acute infarction. Mass effect has resolved. Few additional small
foci of T2 hyperintensity in the supratentorial white matter
probably reflect mild chronic microvascular ischemic changes.

Vascular: Major vessel flow voids at the skull base are preserved.

Skull and upper cervical spine: Normal marrow signal is preserved.

Sinuses/Orbits: Minor paranasal sinus mucosal thickening. Orbits are
unremarkable.

Other: Sella is unremarkable.  Patchy mastoid fluid opacification.
IMPRESSION: Evolution of postoperative changes since the prior study as
described. No evidence of residual or recurrent tumor.

## 2020-04-29 MED ORDER — GADOBUTROL 1 MMOL/ML IV SOLN
9.0000 mL | Freq: Once | INTRAVENOUS | Status: AC | PRN
  Administered 2020-04-29: 9 mL via INTRAVENOUS

## 2020-05-03 ENCOUNTER — Other Ambulatory Visit: Payer: Self-pay | Admitting: Internal Medicine

## 2020-05-03 DIAGNOSIS — N183 Chronic kidney disease, stage 3 unspecified: Secondary | ICD-10-CM

## 2020-05-12 DIAGNOSIS — D320 Benign neoplasm of cerebral meninges: Secondary | ICD-10-CM | POA: Diagnosis not present

## 2020-05-18 ENCOUNTER — Other Ambulatory Visit: Payer: Self-pay | Admitting: Internal Medicine

## 2020-05-19 ENCOUNTER — Other Ambulatory Visit: Payer: Self-pay | Admitting: Adult Health

## 2020-05-19 DIAGNOSIS — E782 Mixed hyperlipidemia: Secondary | ICD-10-CM

## 2020-05-19 DIAGNOSIS — K219 Gastro-esophageal reflux disease without esophagitis: Secondary | ICD-10-CM

## 2020-05-20 ENCOUNTER — Other Ambulatory Visit: Payer: Self-pay

## 2020-05-20 ENCOUNTER — Ambulatory Visit (INDEPENDENT_AMBULATORY_CARE_PROVIDER_SITE_OTHER): Payer: Medicare Other | Admitting: Internal Medicine

## 2020-05-20 ENCOUNTER — Encounter: Payer: Self-pay | Admitting: Internal Medicine

## 2020-05-20 VITALS — BP 130/70 | HR 92 | Ht 70.5 in | Wt 207.0 lb

## 2020-05-20 DIAGNOSIS — N1831 Chronic kidney disease, stage 3a: Secondary | ICD-10-CM | POA: Diagnosis not present

## 2020-05-20 DIAGNOSIS — E785 Hyperlipidemia, unspecified: Secondary | ICD-10-CM | POA: Diagnosis not present

## 2020-05-20 DIAGNOSIS — Z23 Encounter for immunization: Secondary | ICD-10-CM

## 2020-05-20 DIAGNOSIS — E1169 Type 2 diabetes mellitus with other specified complication: Secondary | ICD-10-CM | POA: Diagnosis not present

## 2020-05-20 DIAGNOSIS — E1121 Type 2 diabetes mellitus with diabetic nephropathy: Secondary | ICD-10-CM | POA: Diagnosis not present

## 2020-05-20 DIAGNOSIS — E669 Obesity, unspecified: Secondary | ICD-10-CM

## 2020-05-20 LAB — POCT GLYCOSYLATED HEMOGLOBIN (HGB A1C): Hemoglobin A1C: 5.5 % (ref 4.0–5.6)

## 2020-05-20 NOTE — Progress Notes (Signed)
Patient ID: Jeffery Reed, male   DOB: [birth year], 74 y.o.   MRN: [id number]   This visit occurred during the SARS-CoV-2 public health emergency.  Safety protocols were in place, including screening questions prior to the visit, additional usage of staff PPE, and extensive cleaning of exam room while observing appropriate contact time as indicated for disinfecting solutions.   HPI: Jeffery Reed is a 74 y.o.-year-old male, referred by his PCP, Unk Pinto, MD, for management of DM2, dx in 2018, insulin-dependent since 12/2019, uncontrolled, with complications (CKD stage III).  Last visit 3 months ago.  Patient has a history of meningioma, for which he had surgery, and was on dexamethasone.  His sugars increased around this time so he was started on insulin.  However, since then, before last visit, he was able to stop dexamethasone.  Sugars improved.   He is feeling great after the surgery.  Reviewed HbA1c levels: Lab Results  Component Value Date   HGBA1C 6.3 (H) 03/02/2020   HGBA1C 8.1 (H) 12/29/2019   HGBA1C 7.5 (H) 09/18/2019   HGBA1C 9.4 (H) 06/18/2019   HGBA1C 8.8 (H) 03/17/2019   HGBA1C 9.4 (H) 12/12/2018   HGBA1C 7.8 (H) 09/12/2018   HGBA1C 5.9 (H) 06/03/2018   HGBA1C 6.7 (H) 02/20/2018   HGBA1C 5.8 (H) 10/23/2017   Pt is on a regimen of: - Metformin ER 1000 >> 500 mg >> 1000 mg 2x a day, with meals - Glipizide 05-25-09 mg before meals - Tresiba 10 >> 8 >> Lantus 8  >> 4-6 units at bedtime  Pt checks his sugars twice a day: - am: 95-154, 190 >> 65-103 - 2h after b'fast: 97-204, 220 >> 100-153, 166 - before lunch: 120, 170 >> 82-151, 184 - 2h after lunch: 202 >> n/c >> 118 - before dinner: n/c >> 103, 111 - 2h after dinner: n/c - bedtime: n/c >> 92, 230 - nighttime: n/c Lowest sugar was 95 >> 65; he has hypoglycemia awareness at 100.  Highest sugar was 220 >> 230.  Glucometer: Freestyle  Pt's meals are: - Breakfast:  Instant oatmeal or [employer] sausage  bisuit or scrambled eggs - Lunch: tomato sandwich, ham - Dinner: larger meal: potatoes; spaghetti, lasagna, chicken, veggies: green beans, corn, broccoli - Snacks:potato chips, apples Diet sodas, unsweet tea,   -+ CKD stage III-sees nephrology, last BUN/creatinine:  Lab Results  Component Value Date   BUN 29 (H) 03/02/2020   BUN 22 01/14/2020   CREATININE 1.55 (H) 03/02/2020   CREATININE 1.26 (H) 01/14/2020  On valsartan 320.  -+ HL; last set of lipids: Lab Results  Component Value Date   CHOL 150 03/02/2020   HDL 28 (L) 03/02/2020   LDLCALC 84 03/02/2020   TRIG 287 (H) 03/02/2020   CHOLHDL 5.4 (H) 03/02/2020  On Crestor 10, fenofibrate 160.  - last eye exam was in 08/2019: No DR, + small cataract. Coming up in 08/2020.  -+ Mild, occasional, numbness and tingling in his feet.  Pt has no FH of DM.  He also has HTN, OSA.  He is a retired Airline pilot.  During the winter, he drives a schoolbus.  However, now, after the surgery, he is not sure whether he would return to drive the bus.  ROS: Constitutional: no weight gain/+ weight loss, no fatigue, no subjective hyperthermia, no subjective hypothermia, + nocturia 2-3, increased urination Eyes: no blurry vision, no xerophthalmia ENT: no sore throat, no nodules palpated in neck, no dysphagia, no odynophagia, no hoarseness Cardiovascular:  no CP/no SOB/no palpitations/no leg swelling Respiratory: no cough/no SOB/no wheezing Gastrointestinal: no N/no V/no D/no C/no acid reflux Musculoskeletal: no muscle aches/no joint aches Skin: no rashes, no hair loss Neurological: no tremors/no numbness/no tingling/no dizziness  I reviewed pt's medications, allergies, PMH, social hx, family hx, and changes were documented in the history of present illness. Otherwise, unchanged from my initial visit note.  Past Medical History:  Diagnosis Date  . Allergy   . Anxiety   . Cataract    beginning stage 01/22/2019  . Chronic kidney disease     enlarge bladder  . Diabetes mellitus without complication ([place])   . GERD (gastroesophageal reflux disease)   . Gout   . Hyperlipidemia   . Hypertension   . OSA (obstructive sleep apnea)   . Pre-diabetes   . Sleep apnea    c pap   Past Surgical History:  Procedure Laterality Date  . COLONOSCOPY    . CRANIOTOMY Right 12/29/2019   Procedure: Craniotomy for Tumor;  Surgeon: Kristeen Miss, MD;  Location: [facility];  Service: Neurosurgery;  Laterality: Right;  Craniotomy for Tumor Excision  . FOOT SURGERY  10/2009  . MYRINGOTOMY Left 02-2005   Dr.Newman  . TONSILLECTOMY  1953   Social History   Socioeconomic History  . Marital status: Married    Spouse name: Not on file  . Number of children: Not on file  . Years of education: Not on file  . Highest education level: Not on file  Occupational History  . Not on file  Tobacco Use  . Smoking status: Never Smoker  . Smokeless tobacco: Never Used  Vaping Use  . Vaping Use: Never used  Substance and Sexual Activity  . Alcohol use: No  . Drug use: No  . Sexual activity: Not on file  Other Topics Concern  . Not on file  Social History Narrative  . Not on file   Social Determinants of Health   Financial Resource Strain:   . Difficulty of Paying Living Expenses: Not on file  Food Insecurity:   . Worried About Charity fundraiser in the Last Year: Not on file  . Ran Out of Food in the Last Year: Not on file  Transportation Needs:   . Lack of Transportation (Medical): Not on file  . Lack of Transportation (Non-Medical): Not on file  Physical Activity:   . Days of Exercise per Week: Not on file  . Minutes of Exercise per Session: Not on file  Stress:   . Feeling of Stress : Not on file  Social Connections:   . Frequency of Communication with Friends and Family: Not on file  . Frequency of Social Gatherings with Friends and Family: Not on file  . Attends Religious Services: Not on file  . Active Member of Clubs or Organizations:  Not on file  . Attends Archivist Meetings: Not on file  . Marital Status: Not on file  Intimate Partner Violence:   . Fear of Current or Ex-Partner: Not on file  . Emotionally Abused: Not on file  . Physically Abused: Not on file  . Sexually Abused: Not on file   Current Outpatient Medications on File Prior to Visit  Medication Sig Dispense Refill  . acetaminophen (TYLENOL) 650 MG CR tablet Take 650 mg by mouth every 8 (eight) hours as needed for pain (or headaches).    . Cholecalciferol (VITAMIN D3) 10000 units capsule Take 10,000 Units by mouth at bedtime.     Marland Kitchen  citalopram (CELEXA) 20 MG tablet TAKE 1 TABLET BY MOUTH  DAILY FOR MOOD 90 tablet 3  . fenofibrate 160 MG tablet Take      1 tablet      Daily       for Triglycerides (Blood Fats) 90 tablet 3  . fexofenadine (ALLEGRA) 180 MG tablet Take 180 mg by mouth daily as needed for allergies or rhinitis.    . fluticasone (FLONASE) 50 MCG/ACT nasal spray Use 2 sprays each nostril  /daily (Patient taking differently: Place 2 sprays into both nostrils daily as needed for allergies or rhinitis. ) 48 g 1  . glipiZIDE (GLUCOTROL) 5 MG tablet Take     1 to 2 tablets (5-10 mg)     3 x /day     with Meals for Diabetes 540 tablet 0  . glucose blood (FREESTYLE LITE) test strip Check blood sugar 1 time daily-DX-E11.22 100 each 5  . insulin glargine (LANTUS SOLOSTAR) 100 UNIT/ML Solostar Pen Inject 8 Units into the skin daily. 15 mL 1  . Insulin Pen Needle (BD PEN NEEDLE MICRO U/F) 32G X 6 MM MISC 1 each by Does not apply route daily. E11.22 90 each 2  . levETIRAcetam (KEPPRA) 500 MG tablet Take 1 tablet (500 mg total) by mouth 2 (two) times daily. (Patient not taking: Reported on 03/02/2020) 360 tablet 0  . metFORMIN (GLUCOPHAGE-XR) 500 MG 24 hr tablet Take 2 tablets (1,000 mg total) by mouth 2 (two) times daily with a meal. 360 tablet 3  . mupirocin ointment (BACTROBAN) 2 % APPLY TWICE A DAY TO SKIN INFECTION. (Patient taking differently: Apply  1 application topically 2 (two) times daily as needed (for cuts or abrasions- to heal). ) 66 g 1  . omeprazole (PRILOSEC) 40 MG capsule Take     1 capsule     Daily      to Prevent Indigestion & Heartburn 90 capsule 3  . oxybutynin (DITROPAN) 5 MG tablet Take 1 tablet    3 x /day    for Bladder control 270 tablet 0  . polyethylene glycol (MIRALAX / GLYCOLAX) 17 g packet Take 17 g by mouth daily as needed for mild constipation. 14 each 0  . rosuvastatin (CRESTOR) 10 MG tablet Take 1 tablet Daily for Cholesterol 90 tablet 0  . tamsulosin (FLOMAX) 0.4 MG CAPS capsule Take 1 capsule (0.4 mg total) by mouth daily. 90 capsule 4  . valsartan-hydrochlorothiazide (DIOVAN-HCT) 320-25 MG tablet Take 1 tablet Daily for BP 90 tablet 0  . [DISCONTINUED] citalopram (CELEXA) 20 MG tablet Take 1 tablet Daily for Mood 90 tablet 1   No current facility-administered medications on file prior to visit.   Allergies  Allergen Reactions  . Cardura [Doxazosin Mesylate] Other (See Comments)    Nasal congestion  . Codeine Other (See Comments)    The patient passed out  . Hytrin [Terazosin] Other (See Comments)    Reaction not recalled   Family History  Problem Relation Age of Onset  . Hypertension Mother   . GER disease Mother   . Hyperlipidemia Father   . Hypertension Father   . Parkinson's disease Father   . Hypertension Brother   . Diabetes Maternal Grandmother   . Stroke Paternal Grandmother   . Diabetes Paternal Grandmother   . Stroke Paternal Grandfather   . Colon cancer Neg Hx   . Colon polyps Neg Hx   . Esophageal cancer Neg Hx   . Stomach cancer Neg Hx   .  Rectal cancer Neg Hx    PE: BP 130/70   Pulse 92   Ht 5' 10.5" (1.791 m)   Wt 207 lb (93.9 kg)   SpO2 94%   BMI 29.28 kg/m  Wt Readings from Last 3 Encounters:  05/20/20 207 lb (93.9 kg)  03/02/20 211 lb 3.2 oz (95.8 kg)  02/17/20 212 lb (96.2 kg)   Constitutional: overweight, in NAD Eyes: PERRLA, EOMI, no exophthalmos ENT: moist  mucous membranes, no thyromegaly, no cervical lymphadenopathy Cardiovascular: RRR, No MRG Respiratory: CTA B Gastrointestinal: abdomen soft, NT, ND, BS+ Musculoskeletal: no deformities, strength intact in all 4 Skin: moist, warm, no rashes Neurological: no tremor with outstretched hands, DTR normal in all 4  ASSESSMENT: 1. DM2, insulin-dependent, uncontrolled, with long-term complications - CKD stage 3  2.  Hyperlipidemia  3. Obesity class I  PLAN:  1. Patient with longstanding, uncontrolled, type 2 diabetes, on oral antidiabetic regimen with Metformin and sulfonylurea, and also on basal insulin, which was started after sugar started to increase during the dexamethasone treatment.  At our last visit, he was on Tresiba 10 units but he was off the dexamethasone and sugars improved.  I did advise him to move the [place] injection site to the abdomen, but also to decrease the dose and even stop if the sugars remain controlled.  Since then, he did not stop the insulin completely but we had to switch to Lantus per insurance preference.  At last visit, we discussed about potentially starting a GLP-1 receptor agonist and I advised him to check with his insurance whether these were covered or not.  We also discussed about improving diet by cutting out fried foods and highly processed snacks.  I also advised him to stop apple juice and potato chips.  We increased his Metformin ER to target dose at last visit.  HbA1c obtained in 02/2020 was much better, at 6.3%, decreased from 8.1%. -At today's visit, sugars are mostly at goal, with few hyperglycemic values, but lower than 200s, with dietary indiscretions. He is only using 4 to 6 units of Lantus at night.  Based on his recent sugars, since he had several blood sugars lower than 70s, I would suggest to stop insulin completely.  If the sugars start to increase, we may need a GLP-1 receptor agonist.  He did not have a chance to check with his insurance whether  they cover discussed medication or not. -For now, to avoid low blood sugars when he is working outside in his yard, I advised him to only take 50% of the glipizide dose with a meal prior to activity. - I suggested to:  Patient Instructions  Please stop insulin.  Continue: - Metformin ER 1000 mg 2x a day, with meals - Glipizide 05-25-09 mg before meals  If you plan to be active after a meal, take only 50% of the Glipizide dose.  Please return in 4 months with your sugar log.   - we checked his HbA1c: 5.5% (improved) - advised to check sugars at different times of the day - 1-2x a day, rotating check times - advised for yearly eye exams >> he is UTD - return to clinic in 3 months  2. HL - Reviewed latest lipid panel from 02/2020: LDL above target due to his CKD, triglycerides also high, HDL low: Lab Results  Component Value Date   CHOL 150 03/02/2020   HDL 28 (L) 03/02/2020   LDLCALC 84 03/02/2020   TRIG 287 (H) 03/02/2020  CHOLHDL 5.4 (H) 03/02/2020  - Continues Crestor 10, fenofibrate 160, without side effects  3.  Obesity class I - we will stop Lantus now, and this will most likely also help him lose weight -lost 5 lbs since last OV   Philemon Kingdom, MD PhD [facility] Endocrinology

## 2020-05-20 NOTE — Addendum Note (Signed)
Addended by: Cardell Peach I on: 05/20/2020 03:23 PM   Modules accepted: Orders

## 2020-05-20 NOTE — Patient Instructions (Addendum)
Please stop insulin.  Continue: - Metformin ER 1000 mg 2x a day, with meals - Glipizide 05-25-09 mg before meals  If you plan to be active after a meal, take only 50% of the Glipizide dose.  Please return in 4 months with your sugar log.

## 2020-05-23 ENCOUNTER — Other Ambulatory Visit: Payer: Self-pay | Admitting: Internal Medicine

## 2020-05-23 DIAGNOSIS — I10 Essential (primary) hypertension: Secondary | ICD-10-CM

## 2020-05-31 DIAGNOSIS — Z23 Encounter for immunization: Secondary | ICD-10-CM | POA: Diagnosis not present

## 2020-06-06 NOTE — Progress Notes (Signed)
MEDICARE ANNUAL WELLNESS VISIT AND FOLLOW UP Assessment:   Jeffery Reed was seen today Reed follow-up and medicare wellness.  Diagnoses and all orders Reed this visit:    Essential hypertension Continue current medications: Valsartan-HCTZ 320-25mg  Monitor blood pressure at home; call if consistently over 130/80 Continue DASH diet.   Reminder to go to the ER if any CP, SOB, nausea, dizziness, severe HA, changes vision/speech, left arm numbness and tingling and jaw pain. -     CBC with Differential/Platelet -     COMPLETE METABOLIC PANEL WITH GFR  Hyperlipidemia associated with type 2 diabetes mellitus (HCC) Continue medications: rosuvastatin 10mg  daily Discussed dietary and exercise modifications Low fat diet -     Lipid panel  Type 2 diabetes mellitus with stage 3a chronic kidney disease, without long-term current use of insulin (HCC) Continue medications: Metformin 500mg , two tablets twice a day and glipizide 5mg  once a day. Discussed general issues about diabetes pathophysiology and management. Education: Reviewed 'ABCs' of diabetes management (respective goals in parentheses):  A1C (<7), blood pressure (<130/80), and cholesterol (LDL <70) Dietary recommendations Encouraged aerobic exercise.  Discussed foot care, check daily Yearly retinal exam Dental exam every 6 months Monitor blood glucose, discussed goal Reed patient   Vitamin D deficiency Continue supplementation to maintain goal of 70-100 Taking Vitamin D 10,000 IU daily Defer vitamin D level   OSA on CPAP Continue CPAP/BiPAP, using nightly Reed at least 8 hours  Helping with daytime fatigue Weight loss still advised Discussed mask & tubing hygeine  BPH with obstruction/lower urinary tract symptoms Doing well at this time Continue medications: tamsulosin 0.4mg  daily Follows with Urology Will continue to monitor Defer PSA this check  Idiopathic chronic gout without tophus, unspecified site No maintenance  medicaitons No recent flares Discussed dietary modifications Continue to monitor  Anxiety Doing well on current regiment Continue citalopram 29mg  Discussed stress management techniques   Discussed good sleep hygiene Discussed increasing physical activity and exercise Increase water intake   Overweight (BMI 25.0-29.9) - continue medications, stress management techniques discussed, increase water, good sleep hygiene discussed, increase exercise, and increase veggies.    Medication management Continued  Need Reed hepatitis C screening test -     Hepatitis C antibody    Further disposition pending results if labs check today. Discussed med's effects and SE's.   Over 30 minutes of face to face interview, exam, counseling, chart review, and critical decision making was performed.     Future Appointments  Date Time Provider [place]  09/10/2020 10:30 AM Jeffery Reed [facility] None  09/20/2020 10:00 AM Jeffery Reed [facility] None  03/03/2021  3:00 PM Jeffery Reed [facility] None    Plan:   During the course of the visit the patient was educated and counseled about appropriate screening and preventive services including:    Pneumococcal vaccine   Influenza vaccine  Prevnar 13  Td vaccine  Screening electrocardiogram  Colorectal cancer screening  Diabetes screening  Glaucoma screening  Nutrition counseling    Subjective:  Jeffery Reed is a 74 y.o. male who presents Reed Medicare Annual Wellness Visit and 3 month follow up Reed HTN, HLD, DMII, and vitamin D Def.   He recent had brain tumor removed.   His sugars increased and he was referred to Jeffery Jeffery Reed management.,  His last ov was 3 weeks ago.  He reports that he recently off of insulin.  He is now checking them once a day.  This am fasting was  70.  He denies any hypoglycemia.  He is now taking metformin 500mg  two tablets twice a day with means  He is also taking glipizide  5mg  once a day.  he has a diagnosis of anxiety and is currently prescribed xanax 0.5-1 mg TID PRN, reports symptoms are well controlled on current regimen. he currently takes 1/2-1 tab twice daily, in the last few weeks, but previously would go some weeks without taking at all.   Both mother and father passed away this past year  He has BPH/prostatism followed by urology.  He has tried oxybutin but is not anymore.  He is taking tamsulosin 0.4mg  at night.  He reports he continues to get up 3 times a night.  He is working with a urologist Reed this.  Might start taking twice a day. Next OV is Jan 2022.   BMI is Body mass index is 29.56 kg/m., he admits exercise has been limited, not eating as well since mother passed.  Wt Readings from Last 3 Encounters:  06/07/20 209 lb (94.8 kg)  05/20/20 207 lb (93.9 kg)  03/02/20 211 lb 3.2 oz (95.8 kg)   His blood pressure has been controlled at home, today their BP is BP: 126/74 He does not workout. He denies chest pain, shortness of breath, dizziness.   He is on cholesterol medication (rosuvastatin 10 mg daily) and denies myalgias. His cholesterol is not at goal. The cholesterol last visit was:   Lab Results  Component Value Date   CHOL 150 03/02/2020   HDL 28 (L) 03/02/2020   LDLCALC 84 03/02/2020   TRIG 287 (H) 03/02/2020   CHOLHDL 5.4 (H) 03/02/2020   He has been working on diet and exercise Reed diabetes with CKD, and denies foot ulcerations, hyperglycemia, hypoglycemia , increased appetite, nausea, paresthesia of the feet, polydipsia, polyuria, visual disturbances, vomiting and weight loss. He has history of DMII (2014) and started insulin on May 2021 after adding dexamethasone s/p craniotomy Reed meningioma, Jeffery Reed. He is taking levetiracetam 500mg  BID as prophylaxis. He was referred to Jeffery Reed Reed assistance in management during this time.  He recently stopped using insulin and now on oral agents.  Metformin 530mng 2 tablet BID and  glipizide 5mg  once a day.  He checks random fasting glucose and this am 70. Had A1c check two weeks ago and reports this was 5.1.   Last A1C in the office was:    Lab Results  Component Value Date   HGBA1C 5.5 05/20/2020   Last GFR Lab Results  Component Value Date   GFRNONAA 44 (L) 03/02/2020    Patient is on Vitamin D supplement, taking 10000 IU, taking in AM with PPI Lab Results  Component Value Date   VD25OH 46 03/02/2020       Medication Review: Current Outpatient Medications on File Prior to Visit  Medication Sig Dispense Refill  . acetaminophen (TYLENOL) 650 MG CR tablet Take 650 mg by mouth every 8 (eight) hours as needed Reed pain (or headaches).    . Cholecalciferol (VITAMIN D3) 10000 units capsule Take 10,000 Units by mouth at bedtime.     . citalopram (CELEXA) 20 MG tablet TAKE 1 TABLET BY MOUTH  DAILY Reed MOOD 90 tablet 3  . fenofibrate 160 MG tablet Take      1 tablet      Daily       Reed Triglycerides (Blood Fats) 90 tablet 3  . fexofenadine (ALLEGRA) 180 MG tablet Take 180 mg by  mouth daily as needed Reed allergies or rhinitis.    . fluticasone (FLONASE) 50 MCG/ACT nasal spray Use 2 sprays each nostril  /daily (Patient taking differently: Place 2 sprays into both nostrils daily as needed Reed allergies or rhinitis. ) 48 g 1  . glipiZIDE (GLUCOTROL) 5 MG tablet Take     1 to 2 tablets (5-10 mg)     3 x /day     with Meals Reed Diabetes 540 tablet 0  . glucose blood (FREESTYLE LITE) test strip Check blood sugar 1 time daily-DX-E11.22 100 each 5  . Insulin Pen Needle (BD PEN NEEDLE MICRO U/F) 32G X 6 MM MISC 1 each by Does not apply Reed daily. E11.22 90 each 2  . metFORMIN (GLUCOPHAGE-XR) 500 MG 24 hr tablet Take 2 tablets (1,000 mg total) by mouth 2 (two) times daily with a meal. 360 tablet 3  . mupirocin ointment (BACTROBAN) 2 % APPLY TWICE A DAY TO SKIN INFECTION. (Patient taking differently: Apply 1 application topically 2 (two) times daily as needed (Reed cuts or  abrasions- to heal). ) 66 g 1  . omeprazole (PRILOSEC) 40 MG capsule Take     1 capsule     Daily      to Prevent Indigestion & Heartburn 90 capsule 3  . polyethylene glycol (MIRALAX / GLYCOLAX) 17 g packet Take 17 g by mouth daily as needed Reed mild constipation. 14 each 0  . rosuvastatin (CRESTOR) 10 MG tablet Take 1 tablet Daily Reed Cholesterol 90 tablet 0  . tamsulosin (FLOMAX) 0.4 MG CAPS capsule Take 1 capsule (0.4 mg total) by mouth daily. 90 capsule 4  . valsartan-hydrochlorothiazide (DIOVAN-HCT) 320-25 MG tablet Take     1 tablet      Daily Reed BP 90 tablet 0  . [DISCONTINUED] citalopram (CELEXA) 20 MG tablet Take 1 tablet Daily Reed Mood 90 tablet 1   No current facility-administered medications on file prior to visit.    Current Problems (verified) Patient Active Problem List   Diagnosis Date Noted  . Renal mass 01/08/2020  . Noncompliance with CPAP treatment   . Stage 3 chronic kidney disease ([place])   . Steroid-induced hyperglycemia   . Enlarged prostate   . Tachypnea   . CKD stage 3 due to type 2 diabetes mellitus ([place]) 03/12/2019  . Anxiety 02/18/2018  . GERD (gastroesophageal reflux disease) 03/12/2016  . Obesity (BMI 30.0-34.9) 06/01/2015  . Medication management 08/19/2014  . BPH/Prostatism 08/19/2014  . Essential hypertension 08/17/2013  . Type 2 diabetes mellitus with stage 3 chronic kidney disease, without long-term current use of insulin ([place]) 08/17/2013  . Vitamin D deficiency 08/17/2013  . Hyperlipidemia associated with type 2 diabetes mellitus ([place])   . Testosterone deficiency   . Gout   . OSA (obstructive sleep apnea)     Screening Tests Immunization History  Administered Date(s) Administered  . DT (Pediatric) 06/01/2015  . Fluad Quad(high Dose 65+) 05/20/2020  . Influenza Whole 06/05/2013  . Influenza, High Dose Seasonal PF 06/05/2014, 06/01/2015, 06/14/2016, 06/22/2017, 06/11/2018, 05/13/2019  . PFIZER SARS-COV-2 Vaccination 09/27/2019, 10/20/2019   . Pneumococcal Conjugate-13 08/19/2014  . Pneumococcal Polysaccharide-23 08/22/2003, 12/10/2015  . Tdap 03/08/2004  . Zoster 08/18/2013   Preventative care: Last colonoscopy: 01/2019, 3 polyps removed, due 01/2022 Ct head 2010 CXR 2010  Prior vaccinations: TD or Tdap: 2016  Influenza: 2021  Pneumococcal: 2017 Prevnar13: 2015 Shingles/Zostavax: 2014 Hep C Screening: Due, today  Names of Other Physician/Practitioners you currently use: 1. [facility] here Reed primary care 2. [facility], eye doctor, last visit 09/10/2018 - in chart and abstracted 3. Jeffery. Leonides Sake, dentist, 2020  Patient Care Team: Jeffery Reed as PCP - General (Internal Medicine) Franchot Gallo, Reed as Consulting Physician (Urology) Jamal Maes, Reed as Consulting Physician (Nephrology)  Allergies Allergies  Allergen Reactions  . Cardura [Doxazosin Mesylate] Other (See Comments)    Nasal congestion  . Codeine Other (See Comments)    The patient passed out  . Hytrin [Terazosin] Other (See Comments)    Reaction not recalled    SURGICAL HISTORY He  has a past surgical history that includes Foot surgery (10/2009); Tonsillectomy (1953); Myringotomy (Left, 02-2005); Colonoscopy; and Craniotomy (Right, 12/29/2019). FAMILY HISTORY His family history includes Diabetes in his maternal grandmother and paternal grandmother; GER disease in his mother; Hyperlipidemia in his father; Hypertension in his brother, father, and mother; Parkinson's disease in his father; Stroke in his paternal grandfather and paternal grandmother. SOCIAL HISTORY He  reports that he has never smoked. He has never used smokeless tobacco. He reports that he does not drink alcohol and does not use drugs.  MEDICARE WELLNESS OBJECTIVES: Physical activity: Current Exercise Habits: Home exercise routine, Type of exercise: walking (House and yard work), Time (Minutes): 40, Frequency (Times/Week): 5, Weekly  Exercise (Minutes/Week): 200, Exercise limited by: Other - see comments Cardiac risk factors: Cardiac Risk Factors include: advanced age (>80men, >65 women);dyslipidemia;hypertension;diabetes mellitus;sedentary lifestyle Depression/mood screen:   Depression screen [facility] 2/9 06/07/2020  Decreased Interest 0  Down, Depressed, Hopeless 0  PHQ - 2 Score 0    ADLs:  In your present state of health, do you have any difficulty performing the following activities: 06/07/2020 03/02/2020  Hearing? N N  Vision? N N  Difficulty concentrating or making decisions? N N  Walking or climbing stairs? N N  Dressing or bathing? N N  Doing errands, shopping? N N  Preparing Food and eating ? N -  Using the Toilet? N -  In the past six months, have you accidently leaked urine? N -  Do you have problems with loss of bowel control? N -  Managing your Medications? N -  Managing your Finances? N -  Housekeeping or managing your Housekeeping? N -  Some recent data might be hidden     Cognitive Testing  Alert? Yes  Normal Appearance?Yes  Oriented to person? Yes  Place? Yes   Time? Yes  Recall of three objects?  Yes  Can perform simple calculations? Yes  Displays appropriate judgment?Yes  Can read the correct time from a watch face?Yes  EOL planning: Does Patient Have a Medical Advance Directive?: Yes Does patient want to make changes to medical advance directive?: Yes (MAU/Ambulatory/Procedural Areas - Information given)   Objective:   Today's Vitals   06/07/20 1011  BP: 126/74  Pulse: 75  Temp: 97.7 F (36.5 C)  SpO2: 98%  Weight: 209 lb (94.8 kg)   Body mass index is 29.56 kg/m. General Appearance: Well nourished, in no apparent distress. Eyes: PERRLA, EOMs, conjunctiva no swelling or erythema Sinuses: No Frontal/maxillary tenderness ENT/Mouth: Ext aud canals clear, TMs without erythema, bulging. No erythema, swelling, or exudate on post pharynx.  Tonsils not swollen or erythematous. Hearing  normal.  Neck: Supple, thyroid normal.  Respiratory: Respiratory effort normal, BS equal bilaterally without rales, rhonchi, wheezing or stridor.  Cardio: RRR with no MRGs. Brisk peripheral pulses without edema.  Abdomen: Soft, + BS.  Non tender, no guarding,  rebound, hernias, masses. Lymphatics: Non tender without lymphadenopathy.  Musculoskeletal: Full ROM, 5/5 strength, normal gait.  Skin: Warm, dry without rashes, lesions, ecchymosis.  Neuro: Cranial nerves intact. Normal muscle tone, no cerebellar symptoms. Sensation intact.  Psych: Awake and oriented X 3, normal affect, Insight and Judgment appropriate.   Medicare Attestation I have personally reviewed: The patient's medical and social history Their use of alcohol, tobacco or illicit drugs Their current medications and supplements The patient's functional ability including ADLs,fall risks, home safety risks, cognitive, and hearing and visual impairment Diet and physical activities Evidence Reed depression or mood disorders  The patient's weight, height, BMI, and visual acuity have been recorded in the chart.  I have made referrals, counseling, and provided education to the patient based on review of the above and I have provided the patient with a written personalized care plan Reed preventive services.      Garnet Sierras, Laqueta Jean, DNP [facility] 06/07/2020  10:38 AM

## 2020-06-07 ENCOUNTER — Other Ambulatory Visit: Payer: Self-pay

## 2020-06-07 ENCOUNTER — Ambulatory Visit (INDEPENDENT_AMBULATORY_CARE_PROVIDER_SITE_OTHER): Payer: Medicare Other | Admitting: Adult Health Nurse Practitioner

## 2020-06-07 ENCOUNTER — Encounter: Payer: Self-pay | Admitting: Adult Health Nurse Practitioner

## 2020-06-07 VITALS — BP 126/74 | HR 75 | Temp 97.7°F | Wt 209.0 lb

## 2020-06-07 DIAGNOSIS — E785 Hyperlipidemia, unspecified: Secondary | ICD-10-CM

## 2020-06-07 DIAGNOSIS — Z1159 Encounter for screening for other viral diseases: Secondary | ICD-10-CM

## 2020-06-07 DIAGNOSIS — E669 Obesity, unspecified: Secondary | ICD-10-CM

## 2020-06-07 DIAGNOSIS — N138 Other obstructive and reflux uropathy: Secondary | ICD-10-CM

## 2020-06-07 DIAGNOSIS — Z9989 Dependence on other enabling machines and devices: Secondary | ICD-10-CM

## 2020-06-07 DIAGNOSIS — I10 Essential (primary) hypertension: Secondary | ICD-10-CM

## 2020-06-07 DIAGNOSIS — N401 Enlarged prostate with lower urinary tract symptoms: Secondary | ICD-10-CM | POA: Diagnosis not present

## 2020-06-07 DIAGNOSIS — R6889 Other general symptoms and signs: Secondary | ICD-10-CM | POA: Diagnosis not present

## 2020-06-07 DIAGNOSIS — F419 Anxiety disorder, unspecified: Secondary | ICD-10-CM

## 2020-06-07 DIAGNOSIS — E1122 Type 2 diabetes mellitus with diabetic chronic kidney disease: Secondary | ICD-10-CM | POA: Diagnosis not present

## 2020-06-07 DIAGNOSIS — E1169 Type 2 diabetes mellitus with other specified complication: Secondary | ICD-10-CM

## 2020-06-07 DIAGNOSIS — G4733 Obstructive sleep apnea (adult) (pediatric): Secondary | ICD-10-CM | POA: Diagnosis not present

## 2020-06-07 DIAGNOSIS — N1831 Chronic kidney disease, stage 3a: Secondary | ICD-10-CM | POA: Diagnosis not present

## 2020-06-07 DIAGNOSIS — Z79899 Other long term (current) drug therapy: Secondary | ICD-10-CM | POA: Diagnosis not present

## 2020-06-07 DIAGNOSIS — E663 Overweight: Secondary | ICD-10-CM

## 2020-06-07 DIAGNOSIS — Z0001 Encounter for general adult medical examination with abnormal findings: Secondary | ICD-10-CM

## 2020-06-07 DIAGNOSIS — E559 Vitamin D deficiency, unspecified: Secondary | ICD-10-CM | POA: Diagnosis not present

## 2020-06-07 DIAGNOSIS — M1A00X0 Idiopathic chronic gout, unspecified site, without tophus (tophi): Secondary | ICD-10-CM

## 2020-06-07 NOTE — Patient Instructions (Signed)
We will call you in 1-3 days with your lab results.   Jeffery Reed , Thank you for taking time to come for your Medicare Wellness Visit. I appreciate your ongoing commitment to your health goals. Please review the following plan we discussed and let me know if I can assist you in the future.   These are the goals we discussed: Goals    .  Exercise 150 min/wk Moderate Activity    .  Weight (lb) < 200 lb (90.7 kg) (pt-stated)       This is a list of the screening recommended for you and due dates:  Health Maintenance  Topic Date Due  .  Hepatitis C: One time screening is recommended by Center for Disease Control  (CDC) for  adults born from [birth year] through 1964.   Never done  . Eye exam for diabetics  09/11/2019  . Hemoglobin A1C  11/17/2020  . Complete foot exam   03/02/2021  . Colon Cancer Screening  02/02/2022  . Tetanus Vaccine  05/31/2025  . Flu Shot  Completed  . COVID-19 Vaccine  Completed  . Pneumonia vaccines  Completed      Due to recent changes in healthcare laws, you may see the results of your imaging and laboratory studies on MyChart before your provider has had a chance to review them.  We understand that in some cases there may be results that are confusing or concerning to you. Not all laboratory results come back in the same time frame and the provider may be waiting for multiple results in order to interpret others.  Please give [place] 48 hours in order for your provider to thoroughly review all the results before contacting the office for clarification of your results.   +++++++++++++++++++++++++++++++  Vit D  & Vit C 1,000 mg   are recommended to help protect  against the Covid-19 and other Corona viruses.    Also it's recommended  to take  Zinc 50 mg  to help  protect against the Covid-19   and best place to get  is also on [email or web address]  and don't pay more than 6-8 cents /pill !  ================================ Coronavirus (COVID-19) Are you at risk?  Are  you at risk for the Coronavirus (COVID-19)?  To be considered HIGH RISK for Coronavirus (COVID-19), you have to meet the following criteria:  . Traveled to [place], [place], [place], [place] or [place]; or in the [place] to [place], [place], [place]  . or [place]; and have fever, cough, and shortness of breath within the last 2 weeks of travel OR . Been in close contact with a person diagnosed with COVID-19 within the last 2 weeks and have  . fever, cough,and shortness of breath .  . IF YOU DO NOT MEET THESE CRITERIA, YOU ARE CONSIDERED LOW RISK FOR COVID-19.  What to do if you are HIGH RISK for COVID-19?  Marland Kitchen If you are having a medical emergency, call 911. . Seek medical care right away. Before you go to a doctor's office, urgent care or emergency department, .  call ahead and tell them about your recent travel, contact with someone diagnosed with COVID-19  .  and your symptoms.  . You should receive instructions from your physician's office regarding next steps of care.  . When you arrive at healthcare provider, tell the healthcare staff immediately you have returned from  . visiting [place], [place], [place], [place] or [place]; or traveled in [place]  to [place], [place],  . [place] or [place] in the last two weeks or you have been in close contact with a person diagnosed with  . COVID-19 in the last 2 weeks.   . Tell the health care staff about your symptoms: fever, cough and shortness of breath. . After you have been seen by a medical provider, you will be either: o Tested for (COVID-19) and discharged home on quarantine except to seek medical care if  o symptoms worsen, and asked to  - Stay home and avoid contact with others until you get your results (4-5 days)  - Avoid travel on public transportation if possible (such as bus, train, or airplane) or o Sent to the Emergency Department by EMS for evaluation, COVID-19 testing  and  o possible admission  depending on your condition and test results.  What to do if you are LOW RISK for COVID-19?  Reduce your risk of any infection by using the same precautions used for avoiding the common cold or flu:  Marland Kitchen Wash your hands often with soap and warm water for at least 20 seconds.  If soap and water are not readily available,  . use an alcohol-based hand sanitizer with at least 60% alcohol.  . If coughing or sneezing, cover your mouth and nose by coughing or sneezing into the elbow areas of your shirt or coat, .  into a tissue or into your sleeve (not your hands). . Avoid shaking hands with others and consider head nods or verbal greetings only. . Avoid touching your eyes, nose, or mouth with unwashed hands.  . Avoid close contact with people who are sick. . Avoid places or events with large numbers of people in one location, like concerts or sporting events. . Carefully consider travel plans you have or are making. . If you are planning any travel outside or inside the [place], visit the CDC's Travelers' Health webpage for the latest health notices. . If you have some symptoms but not all symptoms, continue to monitor at home and seek medical attention  . if your symptoms worsen. . If you are having a medical emergency, call 911. >>>>>>>>>>>>>>>>>>>>>>>>>>>>>>>>>>>>>>>>>>>>>>>>>>>>>>> We Do NOT Approve of  [facility] [place] Soliciting Our Patients  To Do Home Visits  & We Do NOT Approve of [employer] > > > > > > > > > > > > > > > > > > > > > > > > > > > > > > > > > > >  > > > >   Preventive Care for Adults  A healthy lifestyle and preventive care can promote health and wellness. Preventive health guidelines for men include the following key practices:  A routine yearly physical is a good way to check with your health care provider about your health and preventative screening. It is a chance to share any concerns and updates on your health and to receive a thorough  exam.  Visit your dentist for a routine exam and preventative care every 6 months. Brush your teeth twice a day and floss once a day. Good oral hygiene prevents tooth decay and gum disease.  The frequency of eye exams is based on your age, health, family medical history, use of contact lenses, and other factors. Follow your health care provider's recommendations for frequency of eye exams.  Eat a healthy diet. Foods such as vegetables, fruits, whole grains, low-fat dairy products, and lean protein foods contain the nutrients you need  without too many calories. Decrease your intake of foods high in solid fats, added sugars, and salt. Eat the right amount of calories for you. Get information about a proper diet from your health care provider, if necessary.  Regular physical exercise is one of the most important things you can do for your health. Most adults should get at least 150 minutes of moderate-intensity exercise (any activity that increases your heart rate and causes you to sweat) each week. In addition, most adults need muscle-strengthening exercises on 2 or more days a week.  Maintain a healthy weight. The body mass index (BMI) is a screening tool to identify possible weight problems. It provides an estimate of body fat based on height and weight. Your health care provider can find your BMI and can help you achieve or maintain a healthy weight. For adults 20 years and older:  A BMI below 18.5 is considered underweight.  A BMI of 18.5 to 24.9 is normal.  A BMI of 25 to 29.9 is considered overweight.  A BMI of 30 and above is considered obese.  Maintain normal blood lipids and cholesterol levels by exercising and minimizing your intake of saturated fat. Eat a balanced diet with plenty of fruit and vegetables. Blood tests for lipids and cholesterol should begin at age 71 and be repeated every 5 years. If your lipid or cholesterol levels are high, you are over 50, or you are at high risk for  heart disease, you may need your cholesterol levels checked more frequently. Ongoing high lipid and cholesterol levels should be treated with medicines if diet and exercise are not working.  If you smoke, find out from your health care provider how to quit. If you do not use tobacco, do not start.  Lung cancer screening is recommended for adults aged 72-80 years who are at high risk for developing lung cancer because of a history of smoking. A yearly low-dose CT scan of the lungs is recommended for people who have at least a 30-pack-year history of smoking and are a current smoker or have quit within the past 15 years. A pack year of smoking is smoking an average of 1 pack of cigarettes a day for 1 year (for example: 1 pack a day for 30 years or 2 packs a day for 15 years). Yearly screening should continue until the smoker has stopped smoking for at least 15 years. Yearly screening should be stopped for people who develop a health problem that would prevent them from having lung cancer treatment.  If you choose to drink alcohol, do not have more than 2 drinks per day. One drink is considered to be 12 ounces (355 mL) of beer, 5 ounces (148 mL) of wine, or 1.5 ounces (44 mL) of liquor.  Avoid use of street drugs. Do not share needles with anyone. Ask for help if you need support or instructions about stopping the use of drugs.  High blood pressure causes heart disease and increases the risk of stroke. Your blood pressure should be checked at least every 1-2 years. Ongoing high blood pressure should be treated with medicines, if weight loss and exercise are not effective.  If you are 37-81 years old, ask your health care provider if you should take aspirin to prevent heart disease.  Diabetes screening involves taking a blood sample to check your fasting blood sugar level. Testing should be considered at a younger age or be carried out more frequently if you are overweight and have at  least 1 risk factor  for diabetes.  Colorectal cancer can be detected and often prevented. Most routine colorectal cancer screening begins at the age of 10 and continues through age 25. However, your health care provider may recommend screening at an earlier age if you have risk factors for colon cancer. On a yearly basis, your health care provider may provide home test kits to check for hidden blood in the stool. Use of a small camera at the end of a tube to directly examine the colon (sigmoidoscopy or colonoscopy) can detect the earliest forms of colorectal cancer. Talk to your health care provider about this at age 27, when routine screening begins. Direct exam of the colon should be repeated every 5-10 years through age 41, unless early forms of precancerous polyps or small growths are found.  Hepatitis C blood testing is recommended for all people born from [birth year] through 1964 and any individual with known risks for hepatitis C.  Screening for abdominal aortic aneurysm (AAA)  by ultrasound is recommended for people who have history of high blood pressure or who are current or former smokers.  Healthy men should  receive prostate-specific antigen (PSA) blood tests as part of routine cancer screening. Talk with your health care provider about prostate cancer screening.  Testicular cancer screening is  recommended for adult males. Screening includes self-exam, a health care provider exam, and other screening tests. Consult with your health care provider about any symptoms you have or any concerns you have about testicular cancer.  Use sunscreen. Apply sunscreen liberally and repeatedly throughout the day. You should seek shade when your shadow is shorter than you. Protect yourself by wearing long sleeves, pants, a wide-brimmed hat, and sunglasses year round, whenever you are outdoors.  Once a month, do a whole-body skin exam, using a mirror to look at the skin on your back. Tell your health care provider about new moles,  moles that have irregular borders, moles that are larger than a pencil eraser, or moles that have changed in shape or color.  Stay current with required vaccines (immunizations).  Influenza vaccine. All adults should be immunized every year.  Tetanus, diphtheria, and acellular pertussis (Td, Tdap) vaccine. An adult who has not previously received Tdap or who does not know his vaccine status should receive 1 dose of Tdap. This initial dose should be followed by tetanus and diphtheria toxoids (Td) booster doses every 10 years. Adults with an unknown or incomplete history of completing a 3-dose immunization series with Td-containing vaccines should begin or complete a primary immunization series including a Tdap dose. Adults should receive a Td booster every 10 years.  Zoster vaccine. One dose is recommended for adults aged 30 years or older unless certain conditions are present.    PREVNAR - Pneumococcal 13-valent conjugate (PCV13) vaccine. When indicated, a person who is uncertain of his immunization history and has no record of immunization should receive the PCV13 vaccine. An adult aged 25 years or older who has certain medical conditions and has not been previously immunized should receive 1 dose of PCV13 vaccine. This PCV13 should be followed with a dose of pneumococcal polysaccharide (PPSV23) vaccine. The PPSV23 vaccine dose should be obtained 1 or more year(s)after the dose of PCV13 vaccine. An adult aged 63 years or older who has certain medical conditions and previously received 1 or more doses of PPSV23 vaccine should receive 1 dose of PCV13. The PCV13 vaccine dose should be obtained 1 or more years after the last  PPSV23 vaccine dose.    PNEUMOVAX - Pneumococcal polysaccharide (PPSV23) vaccine. When PCV13 is also indicated, PCV13 should be obtained first. All adults aged 28 years and older should be immunized. An adult younger than age 35 years who has certain medical conditions should be  immunized. Any person who resides in a nursing home or long-term care facility should be immunized. An adult smoker should be immunized. People with an immunocompromised condition and certain other conditions should receive both PCV13 and PPSV23 vaccines. People with human immunodeficiency virus (HIV) infection should be immunized as soon as possible after diagnosis. Immunization during chemotherapy or radiation therapy should be avoided. Routine use of PPSV23 vaccine is not recommended for American Indians, [place]atives, or people younger than 65 years unless there are medical conditions that require PPSV23 vaccine. When indicated, people who have unknown immunization and have no record of immunization should receive PPSV23 vaccine. One-time revaccination 5 years after the first dose of PPSV23 is recommended for people aged 19-64 years who have chronic kidney failure, nephrotic syndrome, asplenia, or immunocompromised conditions. People who received 1-2 doses of PPSV23 before age 60 years should receive another dose of PPSV23 vaccine at age 65 years or later if at least 5 years have passed since the previous dose. Doses of PPSV23 are not needed for people immunized with PPSV23 at or after age 51 years.    Hepatitis A vaccine. Adults who wish to be protected from this disease, have certain high-risk conditions, work with hepatitis A-infected animals, work in hepatitis A research labs, or travel to or work in countries with a high rate of hepatitis A should be immunized. Adults who were previously unvaccinated and who anticipate close contact with an international adoptee during the first 60 days after arrival in the [place] from a country with a high rate of hepatitis A should be immunized.    Hepatitis B vaccine. Adults should be immunized if they wish to be protected from this disease, have certain high-risk conditions, may be exposed to blood or other infectious body fluids, are household contacts  or sex partners of hepatitis B positive people, are clients or workers in certain care facilities, or travel to or work in countries with a high rate of hepatitis B.   Preventive Service / Frequency   Ages 28 and over  Blood pressure check.  Lipid and cholesterol check.  Lung cancer screening. / Every year if you are aged 20-80 years and have a 30-pack-year history of smoking and currently smoke or have quit within the past 15 years. Yearly screening is stopped once you have quit smoking for at least 15 years or develop a health problem that would prevent you from having lung cancer treatment.  Fecal occult blood test (FOBT) of stool. You may not have to do this test if you get a colonoscopy every 10 years.  Flexible sigmoidoscopy** or colonoscopy.** / Every 5 years for a flexible sigmoidoscopy or every 10 years for a colonoscopy beginning at age 33 and continuing until age 53.  Hepatitis C blood test.** / For all people born from [birth year] through 1964 and any individual with known risks for hepatitis C.  Abdominal aortic aneurysm (AAA) screening./ Screening current or former smokers or have Hypertension.  Skin self-exam. / Monthly.  Influenza vaccine. / Every year.  Tetanus, diphtheria, and acellular pertussis (Tdap/Td) vaccine.** / 1 dose of Td every 10 years.   Zoster vaccine.** / 1 dose for adults aged 75 years or older.  Pneumococcal 13-valent conjugate (PCV13) vaccine.    Pneumococcal polysaccharide (PPSV23) vaccine.     Hepatitis A vaccine.** / Consult your health care provider.  Hepatitis B vaccine.** / Consult your health care provider. Screening for abdominal aortic aneurysm (AAA)  by ultrasound is recommended for people who have history of high blood pressure or who are current or former smokers. ++++++++++ Recommend Adult Low Dose Aspirin or  coated  Aspirin 81 mg daily  To reduce risk of Colon Cancer 40 %,  Skin Cancer 26 % ,  Malignant Melanoma 46%   and  Pancreatic cancer 60% ++++++++++++++++++++++ Vitamin D goal  is between 70-100.  Please make sure that you are taking your Vitamin D as directed.  It is very important as a natural anti-inflammatory  helping hair, skin, and nails, as well as reducing stroke and heart attack risk.  It helps your bones and helps with mood. It also decreases numerous cancer risks so please take it as directed.  Low Vit D is associated with a 200-300% higher risk for CANCER  and 200-300% higher risk for HEART   ATTACK  &  STROKE.   .....................................Marland Kitchen It is also associated with higher death rate at younger ages,  autoimmune diseases like Rheumatoid arthritis, Lupus, Multiple Sclerosis.    Also many other serious conditions, like depression, Alzheimer's Dementia, infertility, muscle aches, fatigue, fibromyalgia - just to name a few. ++++++++++++++++++++++ Recommend the book "The END of DIETING" by Dr Excell Seltzer  & the book "The END of DIABETES " by Dr Excell Seltzer At [employer]com - get book & Audio CD's    Being diabetic has a  300% increased risk for heart attack, stroke, cancer, and alzheimer- type vascular dementia. It is very important that you work harder with diet by avoiding all foods that are white. Avoid white rice (brown & wild rice is OK), white potatoes (sweetpotatoes in moderation is OK), White bread or wheat bread or anything made out of white flour like bagels, donuts, rolls, buns, biscuits, cakes, pastries, cookies, pizza crust, and pasta (made from white flour & egg whites) - vegetarian pasta or spinach or wheat pasta is OK. Multigrain breads like Arnold's or [employer], or multigrain sandwich thins or flatbreads.  Diet, exercise and weight loss can reverse and cure diabetes in the early stages.  Diet, exercise and weight loss is very important in the control and prevention of complications of diabetes which affects every system in your body, ie. Brain - dementia/stroke,  eyes - glaucoma/blindness, heart - heart attack/heart failure, kidneys - dialysis, stomach - gastric paralysis, intestines - malabsorption, nerves - severe painful neuritis, circulation - gangrene & loss of a leg(s), and finally cancer and Alzheimers.    I recommend avoid fried & greasy foods,  sweets/candy, white rice (brown or wild rice or Quinoa is OK), white potatoes (sweet potatoes are OK) - anything made from white flour - bagels, doughnuts, rolls, buns, biscuits,white and wheat breads, pizza crust and traditional pasta made of white flour & egg white(vegetarian pasta or spinach or wheat pasta is OK).  Multi-grain bread is OK - like multi-grain flat bread or sandwich thins. Avoid alcohol in excess. Exercise is also important.    Eat all the vegetables you want - avoid meat, especially red meat and dairy - especially cheese.  Cheese is the most concentrated form of trans-fats which is the worst thing to clog up our arteries. Veggie cheese is OK which can be found in the fresh produce section  at [employer] or [employer] or [employer]  ++++++++++++++++++++++ DASH Eating Plan  DASH stands for "Dietary Approaches to Stop Hypertension."   The DASH eating plan is a healthy eating plan that has been shown to reduce high blood pressure (hypertension). Additional health benefits may include reducing the risk of type 2 diabetes mellitus, heart disease, and stroke. The DASH eating plan may also help with weight loss. WHAT DO I NEED TO KNOW ABOUT THE DASH EATING PLAN? For the DASH eating plan, you will follow these general guidelines:  Choose foods with a percent daily value for sodium of less than 5% (as listed on the food label).  Use salt-free seasonings or herbs instead of table salt or sea salt.  Check with your health care provider or pharmacist before using salt substitutes.  Eat lower-sodium products, often labeled as "lower sodium" or "no salt added."  Eat fresh foods.  Eat more  vegetables, fruits, and low-fat dairy products.  Choose whole grains. Look for the word "whole" as the first word in the ingredient list.  Choose fish   Limit sweets, desserts, sugars, and sugary drinks.  Choose heart-healthy fats.  Eat veggie cheese   Eat more home-cooked food and less restaurant, buffet, and fast food.  Limit fried foods.  Cook foods using methods other than frying.  Limit canned vegetables. If you do use them, rinse them well to decrease the sodium.  When eating at a restaurant, ask that your food be prepared with less salt, or no salt if possible.                      WHAT FOODS CAN I EAT? Read Dr Fara Olden Fuhrman's books on The End of Dieting & The End of Diabetes  Grains Whole grain or whole wheat bread. Brown rice. Whole grain or whole wheat pasta. Quinoa, bulgur, and whole grain cereals. Low-sodium cereals. Corn or whole wheat flour tortillas. Whole grain cornbread. Whole grain crackers. Low-sodium crackers.  Vegetables Fresh or frozen vegetables (raw, steamed, roasted, or grilled). Low-sodium or reduced-sodium tomato and vegetable juices. Low-sodium or reduced-sodium tomato sauce and paste. Low-sodium or reduced-sodium canned vegetables.   Fruits All fresh, canned (in natural juice), or frozen fruits.  Protein Products  All fish and seafood.  Dried beans, peas, or lentils. Unsalted nuts and seeds. Unsalted canned beans.  Dairy Low-fat dairy products, such as skim or 1% milk, 2% or reduced-fat cheeses, low-fat ricotta or cottage cheese, or plain low-fat yogurt. Low-sodium or reduced-sodium cheeses.  Fats and Oils Tub margarines without trans fats. Light or reduced-fat mayonnaise and salad dressings (reduced sodium). Avocado. Safflower, olive, or canola oils. Natural peanut or almond butter.  Other Unsalted popcorn and pretzels. The items listed above may not be a complete list of recommended foods or beverages. Contact your dietitian for more  options.  ++++++++++++++++++++  WHAT FOODS ARE NOT RECOMMENDED? Grains/ White flour or wheat flour White bread. White pasta. White rice. Refined cornbread. Bagels and croissants. Crackers that contain trans fat.  Vegetables  Creamed or fried vegetables. Vegetables in a . Regular canned vegetables. Regular canned tomato sauce and paste. Regular tomato and vegetable juices.  Fruits Dried fruits. Canned fruit in light or heavy syrup. Fruit juice.  Meat and Other Protein Products Meat in general - RED meat & White meat.  Fatty cuts of meat. Ribs, chicken wings, all processed meats as bacon, sausage, bologna, salami, fatback, hot dogs, bratwurst and packaged luncheon meats.  Dairy Whole or  2% milk, cream, half-and-half, and cream cheese. Whole-fat or sweetened yogurt. Full-fat cheeses or blue cheese. Non-dairy creamers and whipped toppings. Processed cheese, cheese spreads, or cheese curds.  Condiments Onion and garlic salt, seasoned salt, table salt, and sea salt. Canned and packaged gravies. Worcestershire sauce. Tartar sauce. Barbecue sauce. Teriyaki sauce. Soy sauce, including reduced sodium. Steak sauce. Fish sauce. Oyster sauce. Cocktail sauce. Horseradish. Ketchup and mustard. Meat flavorings and tenderizers. Bouillon cubes. Hot sauce. Tabasco sauce. Marinades. Taco seasonings. Relishes.  Fats and Oils Butter, stick margarine, lard, shortening and bacon fat. Coconut, palm kernel, or palm oils. Regular salad dressings.  Pickles and olives. Salted popcorn and pretzels.  The items listed above may not be a complete list of foods and beverages to avoid.

## 2020-06-08 LAB — CBC WITH DIFFERENTIAL/PLATELET
Absolute Monocytes: 630 cells/uL (ref 200–950)
Basophils Absolute: 30 cells/uL (ref 0–200)
Basophils Relative: 0.4 %
Eosinophils Absolute: 150 cells/uL (ref 15–500)
Eosinophils Relative: 2 %
HCT: 43.3 % (ref 38.5–50.0)
Hemoglobin: 14.1 g/dL (ref 13.2–17.1)
Lymphs Abs: 1808 cells/uL (ref 850–3900)
MCH: 27.9 pg (ref 27.0–33.0)
MCHC: 32.6 g/dL (ref 32.0–36.0)
MCV: 85.6 fL (ref 80.0–100.0)
MPV: 10.1 fL (ref 7.5–12.5)
Monocytes Relative: 8.4 %
Neutro Abs: 4883 cells/uL (ref 1500–7800)
Neutrophils Relative %: 65.1 %
Platelets: 208 10*3/uL (ref 140–400)
RBC: 5.06 10*6/uL (ref 4.20–5.80)
RDW: 13.4 % (ref 11.0–15.0)
Total Lymphocyte: 24.1 %
WBC: 7.5 10*3/uL (ref 3.8–10.8)

## 2020-06-08 LAB — COMPLETE METABOLIC PANEL WITH GFR
AG Ratio: 2 (calc) (ref 1.0–2.5)
ALT: 18 U/L (ref 9–46)
AST: 27 U/L (ref 10–35)
Albumin: 4.7 g/dL (ref 3.6–5.1)
Alkaline phosphatase (APISO): 42 U/L (ref 35–144)
BUN/Creatinine Ratio: 17 (calc) (ref 6–22)
BUN: 25 mg/dL (ref 7–25)
CO2: 29 mmol/L (ref 20–32)
Calcium: 10.2 mg/dL (ref 8.6–10.3)
Chloride: 103 mmol/L (ref 98–110)
Creat: 1.49 mg/dL — ABNORMAL HIGH (ref 0.70–1.18)
GFR, Est African American: 54 mL/min/{1.73_m2} — ABNORMAL LOW (ref >=60)
GFR, Est Non African American: 46 mL/min/{1.73_m2} — ABNORMAL LOW (ref >=60)
Globulin: 2.4 g/dL (calc) (ref 1.9–3.7)
Glucose, Bld: 47 mg/dL — ABNORMAL LOW (ref 65–99)
Potassium: 4 mmol/L (ref 3.5–5.3)
Sodium: 142 mmol/L (ref 135–146)
Total Bilirubin: 0.6 mg/dL (ref 0.2–1.2)
Total Protein: 7.1 g/dL (ref 6.1–8.1)

## 2020-06-08 LAB — HEPATITIS C ANTIBODY
Hepatitis C Ab: NONREACTIVE
SIGNAL TO CUT-OFF: 0.19 (ref <1.00)

## 2020-06-08 LAB — LIPID PANEL
Cholesterol: 176 mg/dL (ref <200)
HDL: 30 mg/dL — ABNORMAL LOW (ref >=40)
LDL Cholesterol (Calc): 114 mg/dL (calc) — ABNORMAL HIGH
Non-HDL Cholesterol (Calc): 146 mg/dL (calc) — ABNORMAL HIGH (ref <130)
Total CHOL/HDL Ratio: 5.9 (calc) — ABNORMAL HIGH (ref <5.0)
Triglycerides: 199 mg/dL — ABNORMAL HIGH (ref <150)

## 2020-06-09 NOTE — Progress Notes (Signed)
PATIENT IS AWARE OF LAB RESULTS AND INSTRUCTIONS. -E WELCH

## 2020-06-10 ENCOUNTER — Other Ambulatory Visit: Payer: Self-pay | Admitting: Internal Medicine

## 2020-06-24 ENCOUNTER — Other Ambulatory Visit: Payer: Self-pay | Admitting: Internal Medicine

## 2020-06-30 ENCOUNTER — Telehealth: Payer: Self-pay

## 2020-06-30 NOTE — Telephone Encounter (Signed)
Outbound call to patient advising sugars were mostly good with a few exceptions and to continue current regimen. Dr Cruzita Lederer advised no need to restart insulin at this time. Patient verbalized understanding.

## 2020-07-27 ENCOUNTER — Other Ambulatory Visit: Payer: Self-pay | Admitting: Internal Medicine

## 2020-07-27 DIAGNOSIS — N183 Chronic kidney disease, stage 3 unspecified: Secondary | ICD-10-CM

## 2020-07-27 DIAGNOSIS — E1122 Type 2 diabetes mellitus with diabetic chronic kidney disease: Secondary | ICD-10-CM

## 2020-08-31 ENCOUNTER — Other Ambulatory Visit: Payer: Self-pay | Admitting: Internal Medicine

## 2020-09-07 ENCOUNTER — Other Ambulatory Visit: Payer: Self-pay | Admitting: Internal Medicine

## 2020-09-07 DIAGNOSIS — E782 Mixed hyperlipidemia: Secondary | ICD-10-CM

## 2020-09-07 DIAGNOSIS — I10 Essential (primary) hypertension: Secondary | ICD-10-CM

## 2020-09-07 MED ORDER — VALSARTAN-HYDROCHLOROTHIAZIDE 320-25 MG PO TABS: ORAL_TABLET | ORAL | 0 refills | Status: DC

## 2020-09-07 MED ORDER — ROSUVASTATIN CALCIUM 10 MG PO TABS: ORAL_TABLET | ORAL | 0 refills | Status: DC

## 2020-09-10 ENCOUNTER — Ambulatory Visit: Payer: Medicare Other | Admitting: Internal Medicine

## 2020-09-16 ENCOUNTER — Other Ambulatory Visit: Payer: Self-pay

## 2020-09-20 ENCOUNTER — Encounter: Payer: Self-pay | Admitting: Internal Medicine

## 2020-09-20 ENCOUNTER — Other Ambulatory Visit: Payer: Self-pay

## 2020-09-20 ENCOUNTER — Ambulatory Visit (INDEPENDENT_AMBULATORY_CARE_PROVIDER_SITE_OTHER): Payer: Medicare Other | Admitting: Internal Medicine

## 2020-09-20 VITALS — BP 120/82 | HR 65 | Ht 70.5 in | Wt 215.8 lb

## 2020-09-20 DIAGNOSIS — N1831 Chronic kidney disease, stage 3a: Secondary | ICD-10-CM

## 2020-09-20 DIAGNOSIS — E1122 Type 2 diabetes mellitus with diabetic chronic kidney disease: Secondary | ICD-10-CM

## 2020-09-20 DIAGNOSIS — E782 Mixed hyperlipidemia: Secondary | ICD-10-CM | POA: Diagnosis not present

## 2020-09-20 DIAGNOSIS — E663 Overweight: Secondary | ICD-10-CM

## 2020-09-20 LAB — POCT GLYCOSYLATED HEMOGLOBIN (HGB A1C): Hemoglobin A1C: 5.7 % — AB (ref 4.0–5.6)

## 2020-09-20 NOTE — Progress Notes (Signed)
Patient ID: Jeffery Reed, male   DOB: [birth year], 75 y.o.   MRN: [id number]   This visit occurred during the SARS-CoV-2 public health emergency.  Safety protocols were in place, including screening questions prior to the visit, additional usage of staff PPE, and extensive cleaning of exam room while observing appropriate contact time as indicated for disinfecting solutions.   HPI: Jeffery Reed is a 75 y.o.-year-old male, referred by his PCP, Unk Pinto, MD, for management of DM2, dx in 2018, insulin-dependent since 12/2019, uncontrolled, with complications (CKD stage III).  Last visit 4 months ago.  Before last visit, he had meningioma surgery. He recovered well afterwards.  Reviewed HbA1c levels: Lab Results  Component Value Date   HGBA1C 5.5 05/20/2020   HGBA1C 6.3 (H) 03/02/2020   HGBA1C 8.1 (H) 12/29/2019   HGBA1C 7.5 (H) 09/18/2019   HGBA1C 9.4 (H) 06/18/2019   HGBA1C 8.8 (H) 03/17/2019   HGBA1C 9.4 (H) 12/12/2018   HGBA1C 7.8 (H) 09/12/2018   HGBA1C 5.9 (H) 06/03/2018   HGBA1C 6.7 (H) 02/20/2018   Pt is on a regimen of: - Metformin ER 1000 >> 500 mg >> 1000 mg 2x a day, with meals - Glipizide 05-25-09 >> 10-10 mg before meals -  10 >> 8 >> Lantus 8  >> 4-6 units at bedtime >> stopped 04/2020  Pt checks his sugars once a day: - am: 95-154, 190 >> 65-103  >> 68, 80-106 - 2h after b'fast: 97-204, 220 >> 100-153, 166 >> 131, 136, 172 - before lunch: 120, 170 >> 82-151, 184 >> 158, 177, 199 (Christmas) - 2h after lunch: 202 >> n/c >> 118 >> 125, 127 - before dinner: n/c >> 103, 111 >> n/c - 2h after dinner: n/c >> 234 - bedtime: n/c >> 92, 230 >> n/c - nighttime: n/c Lowest sugar was 95 >> 65 >> 68; he has hypoglycemia awareness at 100. Highest sugar was 220 >> 230 >> 234.  Glucometer: Freestyle  Pt's meals are: - Breakfast:  Instant oatmeal or [employer] sausage bisuit or scrambled eggs - Lunch: tomato sandwich, ham - Dinner: larger meal: potatoes;  spaghetti, lasagna, chicken, veggies: green beans, corn, broccoli - Snacks:potato chips, apples Diet sodas, unsweet tea,   -+ CKD stage III-sees nephrology, last BUN/creatinine:  Lab Results  Component Value Date   BUN 25 06/07/2020   BUN 29 (H) 03/02/2020   CREATININE 1.49 (H) 06/07/2020   CREATININE 1.55 (H) 03/02/2020  On valsartan 320.  -+ HL; last set of lipids: Lab Results  Component Value Date   CHOL 176 06/07/2020   HDL 30 (L) 06/07/2020   LDLCALC 114 (H) 06/07/2020   TRIG 199 (H) 06/07/2020   CHOLHDL 5.9 (H) 06/07/2020  On Crestor 10, fenofibrate 160.  - last eye exam was in 08/2019: No DR, + small cataract.   -+ Mild, occasional numbness and tingling in his feet.  Pt has no FH of DM.  He also has HTN, OSA.  He is a retired Airline pilot.  During the winter, he drives a schoolbus.   ROS: Constitutional: + weight gain/no weight loss, no fatigue, no subjective hyperthermia, no subjective hypothermia Eyes: no blurry vision, no xerophthalmia ENT: no sore throat, no nodules palpated in neck, no dysphagia, no odynophagia, no hoarseness Cardiovascular: no CP/no SOB/no palpitations/no leg swelling Respiratory: no cough/no SOB/no wheezing Gastrointestinal: no N/no V/no D/no C/no acid reflux Musculoskeletal: no muscle aches/no joint aches Skin: no rashes, no hair loss Neurological: no tremors/no numbness/no tingling/no dizziness  I reviewed pt's medications, allergies, PMH, social hx, family hx, and changes were documented in the history of present illness. Otherwise, unchanged from my initial visit note.  Past Medical History:  Diagnosis Date  . Allergy   . Anxiety   . Cataract    beginning stage 01/22/2019  . Chronic kidney disease    enlarge bladder  . Diabetes mellitus without complication ([place])   . GERD (gastroesophageal reflux disease)   . Gout   . Hyperlipidemia   . Hypertension   . OSA (obstructive sleep apnea)   . Pre-diabetes   . Sleep apnea    c pap    Past Surgical History:  Procedure Laterality Date  . COLONOSCOPY    . CRANIOTOMY Right 12/29/2019   Procedure: Craniotomy for Tumor;  Surgeon: Kristeen Miss, MD;  Location: [facility];  Service: Neurosurgery;  Laterality: Right;  Craniotomy for Tumor Excision  . FOOT SURGERY  10/2009  . MYRINGOTOMY Left 02-2005   Dr.Newman  . TONSILLECTOMY  1953   Social History   Socioeconomic History  . Marital status: Married    Spouse name: Not on file  . Number of children: Not on file  . Years of education: Not on file  . Highest education level: Not on file  Occupational History  . Not on file  Tobacco Use  . Smoking status: Never Smoker  . Smokeless tobacco: Never Used  Vaping Use  . Vaping Use: Never used  Substance and Sexual Activity  . Alcohol use: No  . Drug use: No  . Sexual activity: Not on file  Other Topics Concern  . Not on file  Social History Narrative  . Not on file   Social Determinants of Health   Financial Resource Strain: Not on file  Food Insecurity: Not on file  Transportation Needs: Not on file  Physical Activity: Not on file  Stress: Not on file  Social Connections: Not on file  Intimate Partner Violence: Not on file   Current Outpatient Medications on File Prior to Visit  Medication Sig Dispense Refill  . acetaminophen (TYLENOL) 650 MG CR tablet Take 650 mg by mouth every 8 (eight) hours as needed for pain (or headaches).    . Cholecalciferol (VITAMIN D3) 10000 units capsule Take 10,000 Units by mouth at bedtime.     . citalopram (CELEXA) 20 MG tablet TAKE 1 TABLET BY MOUTH  DAILY FOR MOOD 90 tablet 3  . fenofibrate 160 MG tablet Take      1 tablet      Daily       for Triglycerides (Blood Fats) 90 tablet 3  . fexofenadine (ALLEGRA) 180 MG tablet Take 180 mg by mouth daily as needed for allergies or rhinitis.    . fluticasone (FLONASE) 50 MCG/ACT nasal spray Use 2 sprays each nostril  /daily (Patient taking differently: Place 2 sprays into both nostrils  daily as needed for allergies or rhinitis. ) 48 g 1  . glipiZIDE (GLUCOTROL) 5 MG tablet TAKE 1 TO 2 TABLETS BY  MOUTH 3 TIMES A DAY WITH  MEALS FOR DIABETES 540 tablet 3  . glucose blood (FREESTYLE LITE) test strip Check blood sugar 1 time daily-DX-E11.22 100 each 5  . Insulin Pen Needle (BD PEN NEEDLE MICRO U/F) 32G X 6 MM MISC 1 each by Does not apply route daily. E11.22 90 each 2  . metFORMIN (GLUCOPHAGE-XR) 500 MG 24 hr tablet Take 2 tablets (1,000 mg total) by mouth 2 (two) times daily with  a meal. 360 tablet 3  . mupirocin ointment (BACTROBAN) 2 % APPLY TWICE A DAY TO SKIN INFECTION. (Patient taking differently: Apply 1 application topically 2 (two) times daily as needed (for cuts or abrasions- to heal). ) 66 g 1  . omeprazole (PRILOSEC) 40 MG capsule Take     1 capsule     Daily      to Prevent Indigestion & Heartburn 90 capsule 3  . oxybutynin (DITROPAN) 5 MG tablet TAKE 1 TABLET BY MOUTH 3  TIMES PER DAY FOR BLADDER  CONTROL 270 tablet 3  . polyethylene glycol (MIRALAX / GLYCOLAX) 17 g packet Take 17 g by mouth daily as needed for mild constipation. 14 each 0  . rosuvastatin (CRESTOR) 10 MG tablet Take 1 tablet Daily for Cholesterol 90 tablet 0  . tamsulosin (FLOMAX) 0.4 MG CAPS capsule Take 1 capsule (0.4 mg total) by mouth daily. 90 capsule 4  . valsartan-hydrochlorothiazide (DIOVAN-HCT) 320-25 MG tablet Take     1 tablet      Daily for BP 90 tablet 0   No current facility-administered medications on file prior to visit.   Allergies  Allergen Reactions  . Cardura [Doxazosin Mesylate] Other (See Comments)    Nasal congestion  . Codeine Other (See Comments)    The patient passed out  . Hytrin [Terazosin] Other (See Comments)    Reaction not recalled   Family History  Problem Relation Age of Onset  . Hypertension Mother   . GER disease Mother   . Hyperlipidemia Father   . Hypertension Father   . Parkinson's disease Father   . Hypertension Brother   . Diabetes Maternal  Grandmother   . Stroke Paternal Grandmother   . Diabetes Paternal Grandmother   . Stroke Paternal Grandfather   . Colon cancer Neg Hx   . Colon polyps Neg Hx   . Esophageal cancer Neg Hx   . Stomach cancer Neg Hx   . Rectal cancer Neg Hx    PE: BP 120/82   Pulse 65   Ht 5' 10.5" (1.791 m)   Wt 215 lb 12.8 oz (97.9 kg)   SpO2 96%   BMI 30.53 kg/m  Wt Readings from Last 3 Encounters:  09/20/20 215 lb 12.8 oz (97.9 kg)  06/07/20 209 lb (94.8 kg)  05/20/20 207 lb (93.9 kg)   Constitutional: overweight, in NAD Eyes: PERRLA, EOMI, no exophthalmos ENT: moist mucous membranes, no thyromegaly, no cervical lymphadenopathy Cardiovascular: RRR, No MRG Respiratory: CTA B Gastrointestinal: abdomen soft, NT, ND, BS+ Musculoskeletal: no deformities, strength intact in all 4 Skin: moist, warm, no rashes Neurological: + mild tremor with outstretched R hand, DTR normal in all 4  ASSESSMENT: 1. DM2, insulin-dependent, previously uncontrolled, with long-term complications - CKD stage 3  2.  Hyperlipidemia  3. Obesity class I  PLAN:  1. Patient with longstanding,  type 2 diabetes, previously on insulin, now only on Metformin and sulfonylurea, after stopping basal insulin at last visit. At that time, HbA1c was lower, at 5.5%, after he improved his diet by cutting out fried foods and highly processed snacks. We also discussed about stopping juice and potato chips. Sugars are mostly at goal with few hyperglycemic values, but lower than 200s, after dietary indiscretions. To avoid low blood sugars at last visit I also advised him to take a lower dose of glipizide if he plans to be active in his yard, after a meal. -At today's visit, he did not check sugars to consistently after  our last visit, but whenever he checks during the holidays they were slightly higher.  However, in January, they started to improve and they are now mostly at goal.  His sugars before lunch can be slightly higher, I believe  due to the proximity of the breakfast to lunch.  I would not suggest changes in his regimen for now.  We discussed about continuing to take only half of the glipizide dose if he plans to be active after the meal. - I suggested to:  Patient Instructions  Please continue: - Metformin ER 1000 mg 2x a day, with meals - Glipizide 05-25-09 mg before meals  If you plan to be active after a meal, take only 50% of the Glipizide dose.  Please return in 4 months with your sugar log.   - we checked his HbA1c: 5.7% (slightly higher) - advised to check sugars at different times of the day - 1x a day, rotating check times - advised for yearly eye exams >> he is UTD - return to clinic in 4 months  2. HL - Reviewed latest lipid panel from 02/2020: LDL above target due to his CKD, triglycerides also high, HDL low: Lab Results  Component Value Date   CHOL 176 06/07/2020   HDL 30 (L) 06/07/2020   LDLCALC 114 (H) 06/07/2020   TRIG 199 (H) 06/07/2020   CHOLHDL 5.9 (H) 06/07/2020  - Continues Crestor 10, fenofibrate 160, without side effects  3.  Obesity class I -At last visit we stopped Lantus, which is weight inducing -He lost 5 pounds before last visit, gained 6 lbs since last OV, during the holidays   Philemon Kingdom, MD PhD [facility]

## 2020-09-20 NOTE — Patient Instructions (Addendum)
Please continue: - Metformin ER 1000 mg 2x a day, with meals - Glipizide 10-(5)-10 mg before meals  If you plan to be active after a meal, take only 50% of the Glipizide dose.  Please return in 4 months with your sugar log.

## 2020-09-23 ENCOUNTER — Ambulatory Visit (INDEPENDENT_AMBULATORY_CARE_PROVIDER_SITE_OTHER): Payer: Medicare Other | Admitting: Internal Medicine

## 2020-09-23 ENCOUNTER — Other Ambulatory Visit: Payer: Self-pay | Admitting: *Deleted

## 2020-09-23 ENCOUNTER — Other Ambulatory Visit: Payer: Self-pay

## 2020-09-23 VITALS — BP 116/72 | HR 70 | Temp 97.5°F | Resp 16 | Ht 70.5 in | Wt 214.4 lb

## 2020-09-23 DIAGNOSIS — E559 Vitamin D deficiency, unspecified: Secondary | ICD-10-CM | POA: Diagnosis not present

## 2020-09-23 DIAGNOSIS — Z9989 Dependence on other enabling machines and devices: Secondary | ICD-10-CM

## 2020-09-23 DIAGNOSIS — M1A00X0 Idiopathic chronic gout, unspecified site, without tophus (tophi): Secondary | ICD-10-CM | POA: Diagnosis not present

## 2020-09-23 DIAGNOSIS — Z794 Long term (current) use of insulin: Secondary | ICD-10-CM

## 2020-09-23 DIAGNOSIS — E1169 Type 2 diabetes mellitus with other specified complication: Secondary | ICD-10-CM

## 2020-09-23 DIAGNOSIS — G4733 Obstructive sleep apnea (adult) (pediatric): Secondary | ICD-10-CM | POA: Diagnosis not present

## 2020-09-23 DIAGNOSIS — Z79899 Other long term (current) drug therapy: Secondary | ICD-10-CM | POA: Diagnosis not present

## 2020-09-23 DIAGNOSIS — E785 Hyperlipidemia, unspecified: Secondary | ICD-10-CM | POA: Diagnosis not present

## 2020-09-23 DIAGNOSIS — N1831 Chronic kidney disease, stage 3a: Secondary | ICD-10-CM

## 2020-09-23 DIAGNOSIS — I10 Essential (primary) hypertension: Secondary | ICD-10-CM | POA: Diagnosis not present

## 2020-09-23 DIAGNOSIS — E1122 Type 2 diabetes mellitus with diabetic chronic kidney disease: Secondary | ICD-10-CM | POA: Diagnosis not present

## 2020-09-23 MED ORDER — MUPIROCIN 2 % EX OINT: TOPICAL_OINTMENT | CUTANEOUS | 1 refills | Status: DC

## 2020-09-23 NOTE — Progress Notes (Signed)
History of Present Illness:       This very nice 75 y.o. MWM  presents for 6 month follow up with HTN, HLD, T2_IDDM and Vitamin D Deficiency.  Patient is on CPAP for OSA with improved sleep hygiene. Patient has Gout controlled on his Allopurinol.      In May 2021 patient underwent craniotomy for meningoma resection by Dr Ellene Route and when on Decadron post-op, patients's diabetes flared requiring insulin & patient was referred to Dr Cruzita Lederer for management.       Patient is treated for HTN (1992)  & BP has been controlled at home. Today's BP is at goal - 116/72. Patient has had no complaints of any cardiac type chest pain, palpitations, dyspnea / orthopnea / PND, dizziness, claudication, or dependent edema.      Hyperlipidemia is controlled with diet & meds. Patient denies myalgias or other med SE's. Last Lipids were not at goal:  Lab Results  Component Value Date   CHOL 176 06/07/2020   HDL 30 (L) 06/07/2020   LDLCALC 114 (H) 06/07/2020   TRIG 199 (H) 06/07/2020   CHOLHDL 5.9 (H) 06/07/2020    Also, the patient has history of T2_NIDDM (2014)  w/CKD 3 (GFR 57)  and was started on insulin in May 2021 (managed by Dr Cruzita Lederer). He has had no symptoms of reactive hypoglycemia, diabetic polys, paresthesias or visual blurring.  Last A1c was at goal:  Lab Results  Component Value Date   HGBA1C 5.7 (A) 09/20/2020           Further, the patient also has history of Vitamin D Deficiency("22" /2008)  and supplements vitamin D without any suspected side-effects. Last vitamin D was still low (goal 70-100):  Lab Results   Value Date   VD25OH 48 09/23/2020     Medication Sig  . acetaminophen  650 MG CR tablet Take  every 8  hours as needed for pain  . VITAMIN D 29562 units capsule Take  at bedtime.   . citalopram  20 MG tablet TAKE 1 TABLET  DAILY   . fenofibrate 160 MG tablet Take      1 tablet      Daily    . Fexofenadine 180 MG tablet Take daily as needed for allergies  . FLONASE  nasal spray Use 2 sprays each nostril /daily  . glipiZIDE) 5 MG tablet TAKE 1 TO 2 TABLETS  3 TIMES A DAY   . metFORMIN-XR) 500 MG  Take 2 tablets 2  times daily with a meal.  . omeprazole40 MG capsule Take     1 capsule     Daily     . polyethylene glycol  17 g packet Take 17 g  daily as needed for mild constipation.  . rosuvastatin) 10 MG tablet Take 1 tablet Daily   . valsartan-hctz) 320-25 MG tablet Take     1 tablet      Daily for BP    Allergies  Allergen Reactions  . Cardura [Doxazosin Mesylate] Other (See Comments)    Nasal congestion  . Codeine Other (See Comments)    The patient passed out  . Hytrin [Terazosin] Other (See Comments)    Reaction not recalled    PMHx:   Past Medical History:  Diagnosis Date  . Allergy   . Anxiety   . Cataract    beginning stage 01/22/2019  . Chronic kidney disease    enlarge bladder  . Diabetes mellitus without  complication ([place])   . GERD (gastroesophageal reflux disease)   . Gout   . Hyperlipidemia   . Hypertension   . OSA (obstructive sleep apnea)   . Pre-diabetes   . Sleep apnea    cpap    Immunization History  Administered Date(s) Administered  . DT (Pediatric) 06/01/2015  . Fluad Quad(high Dose 65+) 05/20/2020  . Influenza Whole 06/05/2013  . Influenza, High Dose Seasonal PF 06/05/2014, 06/01/2015, 06/14/2016, 06/22/2017, 06/11/2018, 05/13/2019  . PFIZER(Purple Top)SARS-COV-2 Vaccination 09/27/2019, 10/20/2019  . Pneumococcal Conjugate-13 08/19/2014  . Pneumococcal Polysaccharide-23 08/22/2003, 12/10/2015  . Tdap 03/08/2004  . Zoster 08/18/2013    Past Surgical History:  Procedure Laterality Date  . COLONOSCOPY    . CRANIOTOMY Right 12/29/2019   Procedure: Craniotomy for Tumor;  Surgeon: Kristeen Miss, MD;  Location: [place];  Service: Neurosurgery;  Laterality: Right;  Craniotomy for Tumor Excision  . FOOT SURGERY  10/2009  . MYRINGOTOMY Left 02-2005   Dr.Newman  . TONSILLECTOMY  1953    FHx:    Reviewed /  unchanged  SHx:    Reviewed / unchanged   Systems Review:  Constitutional: Denies fever, chills, wt changes, headaches, insomnia, fatigue, night sweats, change in appetite. Eyes: Denies redness, blurred vision, diplopia, discharge, itchy, watery eyes.  ENT: Denies discharge, congestion, post nasal drip, epistaxis, sore throat, earache, hearing loss, dental pain, tinnitus, vertigo, sinus pain, snoring.  CV: Denies chest pain, palpitations, irregular heartbeat, syncope, dyspnea, diaphoresis, orthopnea, PND, claudication or edema. Respiratory: denies cough, dyspnea, DOE, pleurisy, hoarseness, laryngitis, wheezing.  Gastrointestinal: Denies dysphagia, odynophagia, heartburn, reflux, water brash, abdominal pain or cramps, nausea, vomiting, bloating, diarrhea, constipation, hematemesis, melena, hematochezia  or hemorrhoids. Genitourinary: Denies dysuria, frequency, urgency, nocturia, hesitancy, discharge, hematuria or flank pain. Musculoskeletal: Denies arthralgias, myalgias, stiffness, jt. swelling, pain, limping or strain/sprain.  Skin: Denies pruritus, rash, hives, warts, acne, eczema or change in skin lesion(s). Neuro: No weakness, tremor, incoordination, spasms, paresthesia or pain. Psychiatric: Denies confusion, memory loss or sensory loss. Endo: Denies change in weight, skin or hair change.  Heme/Lymph: No excessive bleeding, bruising or enlarged lymph nodes.  Physical Exam  BP 116/72   Pulse 70   Temp (!) 97.5 F (36.4 C)   Resp 16   Ht 5' 10.5" (1.791 m)   Wt 214 lb 6.4 oz (97.3 kg)   SpO2 99%   BMI 30.33 kg/m   Appears  well nourished, well groomed  and in no distress.  Eyes: PERRLA, EOMs, conjunctiva no swelling or erythema. Sinuses: No frontal/maxillary tenderness ENT/Mouth: EAC's clear, TM's nl w/o erythema, bulging. Nares clear w/o erythema, swelling, exudates. Oropharynx clear without erythema or exudates. Oral hygiene is good. Tongue normal, non obstructing. Hearing  intact.  Neck: Supple. Thyroid not palpable. Car 2+/2+ without bruits, nodes or JVD. Chest: Respirations nl with BS clear & equal w/o rales, rhonchi, wheezing or stridor.  Cor: Heart sounds normal w/ regular rate and rhythm without sig. murmurs, gallops, clicks or rubs. Peripheral pulses normal and equal  without edema.  Abdomen: Soft & bowel sounds normal. Non-tender w/o guarding, rebound, hernias, masses or organomegaly.  Lymphatics: Unremarkable.  Musculoskeletal: Full ROM all peripheral extremities, joint stability, 5/5 strength and normal gait.  Skin: Warm, dry without exposed rashes, lesions or ecchymosis apparent.  Neuro: Cranial nerves intact, reflexes equal bilaterally. Sensory-motor testing grossly intact. Tendon reflexes grossly intact.  Pysch: Alert & oriented x 3.  Insight and judgement nl & appropriate. No ideations.  Assessment and Plan:  1. Essential  hypertension  - Continue medication, monitor blood pressure at home.  - Continue DASH diet.  Reminder to go to the ER if any CP,  SOB, nausea, dizziness, severe HA, changes vision/speech.  - CBC with Differential/Platelet - COMPLETE METABOLIC PANEL WITH GFR - Magnesium - TSH  2. Hyperlipidemia associated with type 2 diabetes mellitus ([place])  - Continue diet/meds, exercise,& lifestyle modifications.  - Continue monitor periodic cholesterol/liver & renal functions   - Lipid panel - TSH  3. Type 2 diabetes mellitus with stage 3a chronic kidney  disease, with long-term current use of insulin ([facility])  - Continue diet, exercise  - Lifestyle modifications.  - Monitor appropriate labs  - Fructosamine  4. Vitamin D deficiency  - Continue supplementation.  - VITAMIN D 25 Hydroxy  5. OSA on CPAP   6. Idiopathic chronic gout - Uric acid  7. Medication management  - CBC with Differential/Platelet - COMPLETE METABOLIC PANEL WITH GFR - Magnesium - Lipid panel - TSH - Fructosamine - VITAMIN D 25 Hydroxy - Uric  acid        Discussed  regular exercise, BP monitoring, weight control to achieve/maintain BMI less than 25 and discussed med and SE's. Recommended labs to assess and monitor clinical status with further disposition pending results of labs.  I discussed the assessment and treatment plan with the patient. The patient was provided an opportunity to ask questions and all were answered. The patient agreed with the plan and demonstrated an understanding of the instructions.  I provided over 30 minutes of exam, counseling, chart review and  complex critical decision making.        The patient was advised to call back or seek an in-person evaluation if the symptoms worsen or if the condition fails to improve as anticipated.   Kirtland Bouchard, MD

## 2020-09-23 NOTE — Patient Instructions (Signed)

## 2020-09-25 ENCOUNTER — Encounter: Payer: Self-pay | Admitting: Internal Medicine

## 2020-09-28 DIAGNOSIS — N1832 Chronic kidney disease, stage 3b: Secondary | ICD-10-CM | POA: Diagnosis not present

## 2020-09-28 LAB — CBC WITH DIFFERENTIAL/PLATELET
Absolute Monocytes: 665 cells/uL (ref 200–950)
Basophils Absolute: 49 cells/uL (ref 0–200)
Basophils Relative: 0.7 %
Eosinophils Absolute: 210 cells/uL (ref 15–500)
Eosinophils Relative: 3 %
HCT: 41.1 % (ref 38.5–50.0)
Hemoglobin: 14 g/dL (ref 13.2–17.1)
Lymphs Abs: 1862 cells/uL (ref 850–3900)
MCH: 29.6 pg (ref 27.0–33.0)
MCHC: 34.1 g/dL (ref 32.0–36.0)
MCV: 86.9 fL (ref 80.0–100.0)
MPV: 10.2 fL (ref 7.5–12.5)
Monocytes Relative: 9.5 %
Neutro Abs: 4214 cells/uL (ref 1500–7800)
Neutrophils Relative %: 60.2 %
Platelets: 196 10*3/uL (ref 140–400)
RBC: 4.73 10*6/uL (ref 4.20–5.80)
RDW: 13.2 % (ref 11.0–15.0)
Total Lymphocyte: 26.6 %
WBC: 7 10*3/uL (ref 3.8–10.8)

## 2020-09-28 LAB — COMPLETE METABOLIC PANEL WITH GFR
AG Ratio: 2 (calc) (ref 1.0–2.5)
ALT: 15 U/L (ref 9–46)
AST: 21 U/L (ref 10–35)
Albumin: 4.5 g/dL (ref 3.6–5.1)
Alkaline phosphatase (APISO): 46 U/L (ref 35–144)
BUN/Creatinine Ratio: 15 (calc) (ref 6–22)
BUN: 25 mg/dL (ref 7–25)
CO2: 29 mmol/L (ref 20–32)
Calcium: 10.1 mg/dL (ref 8.6–10.3)
Chloride: 105 mmol/L (ref 98–110)
Creat: 1.64 mg/dL — ABNORMAL HIGH (ref 0.70–1.18)
GFR, Est African American: 47 mL/min/{1.73_m2} — ABNORMAL LOW (ref >=60)
GFR, Est Non African American: 41 mL/min/{1.73_m2} — ABNORMAL LOW (ref >=60)
Globulin: 2.3 g/dL (calc) (ref 1.9–3.7)
Glucose, Bld: 128 mg/dL — ABNORMAL HIGH (ref 65–99)
Potassium: 4.3 mmol/L (ref 3.5–5.3)
Sodium: 142 mmol/L (ref 135–146)
Total Bilirubin: 0.4 mg/dL (ref 0.2–1.2)
Total Protein: 6.8 g/dL (ref 6.1–8.1)

## 2020-09-28 LAB — MAGNESIUM: Magnesium: 1.7 mg/dL (ref 1.5–2.5)

## 2020-09-28 LAB — LIPID PANEL
Cholesterol: 168 mg/dL (ref <200)
HDL: 31 mg/dL — ABNORMAL LOW (ref >=40)
LDL Cholesterol (Calc): 109 mg/dL (calc) — ABNORMAL HIGH
Non-HDL Cholesterol (Calc): 137 mg/dL (calc) — ABNORMAL HIGH (ref <130)
Total CHOL/HDL Ratio: 5.4 (calc) — ABNORMAL HIGH (ref <5.0)
Triglycerides: 161 mg/dL — ABNORMAL HIGH (ref <150)

## 2020-09-28 LAB — TSH: TSH: 3.53 mIU/L (ref 0.40–4.50)

## 2020-09-28 LAB — FRUCTOSAMINE: Fructosamine: 232 umol/L (ref 205–285)

## 2020-09-28 LAB — VITAMIN D 25 HYDROXY (VIT D DEFICIENCY, FRACTURES): Vit D, 25-Hydroxy: 48 ng/mL (ref 30–100)

## 2020-09-28 LAB — URIC ACID: Uric Acid, Serum: 5.1 mg/dL (ref 4.0–8.0)

## 2020-09-28 NOTE — Progress Notes (Signed)
========================================================== ==========================================================  -    Fructosamine = 232 mg% finally returned and approximates an A1c of  about 5.6% - Excellent - Keep up the Great work   !  ========================================================== ==========================================================

## 2020-10-01 ENCOUNTER — Other Ambulatory Visit: Payer: Self-pay | Admitting: Internal Medicine

## 2020-10-01 DIAGNOSIS — E782 Mixed hyperlipidemia: Secondary | ICD-10-CM

## 2020-10-01 DIAGNOSIS — I10 Essential (primary) hypertension: Secondary | ICD-10-CM

## 2020-10-06 DIAGNOSIS — E559 Vitamin D deficiency, unspecified: Secondary | ICD-10-CM | POA: Diagnosis not present

## 2020-10-06 DIAGNOSIS — N281 Cyst of kidney, acquired: Secondary | ICD-10-CM | POA: Diagnosis not present

## 2020-10-06 DIAGNOSIS — I129 Hypertensive chronic kidney disease with stage 1 through stage 4 chronic kidney disease, or unspecified chronic kidney disease: Secondary | ICD-10-CM | POA: Diagnosis not present

## 2020-10-06 DIAGNOSIS — N1832 Chronic kidney disease, stage 3b: Secondary | ICD-10-CM | POA: Diagnosis not present

## 2020-10-06 DIAGNOSIS — N400 Enlarged prostate without lower urinary tract symptoms: Secondary | ICD-10-CM | POA: Diagnosis not present

## 2020-10-06 DIAGNOSIS — R778 Other specified abnormalities of plasma proteins: Secondary | ICD-10-CM | POA: Diagnosis not present

## 2020-10-13 ENCOUNTER — Other Ambulatory Visit: Payer: Self-pay | Admitting: Nephrology

## 2020-10-13 DIAGNOSIS — N281 Cyst of kidney, acquired: Secondary | ICD-10-CM

## 2020-10-13 DIAGNOSIS — N1832 Chronic kidney disease, stage 3b: Secondary | ICD-10-CM

## 2020-10-14 ENCOUNTER — Other Ambulatory Visit: Payer: Self-pay

## 2020-10-14 ENCOUNTER — Emergency Department (HOSPITAL_BASED_OUTPATIENT_CLINIC_OR_DEPARTMENT_OTHER): Payer: Medicare Other

## 2020-10-14 ENCOUNTER — Inpatient Hospital Stay (HOSPITAL_BASED_OUTPATIENT_CLINIC_OR_DEPARTMENT_OTHER)
Admission: EM | Admit: 2020-10-14 | Discharge: 2020-10-16 | DRG: 101 | Disposition: A | Discharge status: Home / Self Care | Payer: Medicare Other | Attending: Internal Medicine | Admitting: Internal Medicine

## 2020-10-14 ENCOUNTER — Encounter (HOSPITAL_BASED_OUTPATIENT_CLINIC_OR_DEPARTMENT_OTHER): Payer: Self-pay

## 2020-10-14 DIAGNOSIS — I10 Essential (primary) hypertension: Secondary | ICD-10-CM | POA: Diagnosis not present

## 2020-10-14 DIAGNOSIS — N179 Acute kidney failure, unspecified: Secondary | ICD-10-CM

## 2020-10-14 DIAGNOSIS — K219 Gastro-esophageal reflux disease without esophagitis: Secondary | ICD-10-CM | POA: Diagnosis present

## 2020-10-14 DIAGNOSIS — Z823 Family history of stroke: Secondary | ICD-10-CM

## 2020-10-14 DIAGNOSIS — Z820 Family history of epilepsy and other diseases of the nervous system: Secondary | ICD-10-CM | POA: Diagnosis not present

## 2020-10-14 DIAGNOSIS — Z9114 Patient's other noncompliance with medication regimen: Secondary | ICD-10-CM

## 2020-10-14 DIAGNOSIS — Z7984 Long term (current) use of oral hypoglycemic drugs: Secondary | ICD-10-CM | POA: Diagnosis not present

## 2020-10-14 DIAGNOSIS — N401 Enlarged prostate with lower urinary tract symptoms: Secondary | ICD-10-CM | POA: Diagnosis not present

## 2020-10-14 DIAGNOSIS — N1831 Chronic kidney disease, stage 3a: Secondary | ICD-10-CM | POA: Diagnosis present

## 2020-10-14 DIAGNOSIS — Z83438 Family history of other disorder of lipoprotein metabolism and other lipidemia: Secondary | ICD-10-CM

## 2020-10-14 DIAGNOSIS — E1169 Type 2 diabetes mellitus with other specified complication: Secondary | ICD-10-CM | POA: Diagnosis present

## 2020-10-14 DIAGNOSIS — Z91199 Patient's noncompliance with other medical treatment and regimen due to unspecified reason: Secondary | ICD-10-CM

## 2020-10-14 DIAGNOSIS — G4733 Obstructive sleep apnea (adult) (pediatric): Secondary | ICD-10-CM | POA: Diagnosis not present

## 2020-10-14 DIAGNOSIS — H269 Unspecified cataract: Secondary | ICD-10-CM | POA: Diagnosis not present

## 2020-10-14 DIAGNOSIS — E1122 Type 2 diabetes mellitus with diabetic chronic kidney disease: Secondary | ICD-10-CM | POA: Diagnosis not present

## 2020-10-14 DIAGNOSIS — Z86011 Personal history of benign neoplasm of the brain: Secondary | ICD-10-CM

## 2020-10-14 DIAGNOSIS — E785 Hyperlipidemia, unspecified: Secondary | ICD-10-CM | POA: Diagnosis not present

## 2020-10-14 DIAGNOSIS — Z833 Family history of diabetes mellitus: Secondary | ICD-10-CM

## 2020-10-14 DIAGNOSIS — Z888 Allergy status to other drugs, medicaments and biological substances status: Secondary | ICD-10-CM

## 2020-10-14 DIAGNOSIS — F419 Anxiety disorder, unspecified: Secondary | ICD-10-CM | POA: Diagnosis present

## 2020-10-14 DIAGNOSIS — Z885 Allergy status to narcotic agent status: Secondary | ICD-10-CM

## 2020-10-14 DIAGNOSIS — J310 Chronic rhinitis: Secondary | ICD-10-CM

## 2020-10-14 DIAGNOSIS — M109 Gout, unspecified: Secondary | ICD-10-CM | POA: Diagnosis present

## 2020-10-14 DIAGNOSIS — R251 Tremor, unspecified: Secondary | ICD-10-CM

## 2020-10-14 DIAGNOSIS — Z8249 Family history of ischemic heart disease and other diseases of the circulatory system: Secondary | ICD-10-CM

## 2020-10-14 DIAGNOSIS — I129 Hypertensive chronic kidney disease with stage 1 through stage 4 chronic kidney disease, or unspecified chronic kidney disease: Secondary | ICD-10-CM | POA: Diagnosis not present

## 2020-10-14 DIAGNOSIS — Z20822 Contact with and (suspected) exposure to covid-19: Secondary | ICD-10-CM | POA: Diagnosis not present

## 2020-10-14 DIAGNOSIS — N1832 Chronic kidney disease, stage 3b: Secondary | ICD-10-CM

## 2020-10-14 DIAGNOSIS — R350 Frequency of micturition: Secondary | ICD-10-CM | POA: Diagnosis present

## 2020-10-14 DIAGNOSIS — N183 Chronic kidney disease, stage 3 unspecified: Secondary | ICD-10-CM | POA: Diagnosis present

## 2020-10-14 DIAGNOSIS — N189 Chronic kidney disease, unspecified: Secondary | ICD-10-CM

## 2020-10-14 DIAGNOSIS — R569 Unspecified convulsions: Secondary | ICD-10-CM

## 2020-10-14 DIAGNOSIS — Z79899 Other long term (current) drug therapy: Secondary | ICD-10-CM

## 2020-10-14 LAB — BASIC METABOLIC PANEL
Anion gap: 11 (ref 5–15)
BUN: 36 mg/dL — ABNORMAL HIGH (ref 8–23)
CO2: 23 mmol/L (ref 22–32)
Calcium: 9.8 mg/dL (ref 8.9–10.3)
Chloride: 104 mmol/L (ref 98–111)
Creatinine, Ser: 2.04 mg/dL — ABNORMAL HIGH (ref 0.61–1.24)
GFR, Estimated: 34 mL/min — ABNORMAL LOW (ref >60)
Glucose, Bld: 99 mg/dL (ref 70–99)
Potassium: 3.8 mmol/L (ref 3.5–5.1)
Sodium: 138 mmol/L (ref 135–145)

## 2020-10-14 LAB — CBC WITH DIFFERENTIAL/PLATELET
Abs Immature Granulocytes: 0.07 10*3/uL (ref 0.00–0.07)
Basophils Absolute: 0 10*3/uL (ref 0.0–0.1)
Basophils Relative: 0 %
Eosinophils Absolute: 0.3 10*3/uL (ref 0.0–0.5)
Eosinophils Relative: 3 %
HCT: 41.1 % (ref 39.0–52.0)
Hemoglobin: 14.1 g/dL (ref 13.0–17.0)
Immature Granulocytes: 1 %
Lymphocytes Relative: 26 %
Lymphs Abs: 2.4 10*3/uL (ref 0.7–4.0)
MCH: 29.4 pg (ref 26.0–34.0)
MCHC: 34.3 g/dL (ref 30.0–36.0)
MCV: 85.6 fL (ref 80.0–100.0)
Monocytes Absolute: 0.8 10*3/uL (ref 0.1–1.0)
Monocytes Relative: 9 %
Neutro Abs: 5.4 10*3/uL (ref 1.7–7.7)
Neutrophils Relative %: 61 %
Platelets: 207 10*3/uL (ref 150–400)
RBC: 4.8 MIL/uL (ref 4.22–5.81)
RDW: 14.1 % (ref 11.5–15.5)
WBC: 8.9 10*3/uL (ref 4.0–10.5)
nRBC: 0 % (ref 0.0–0.2)

## 2020-10-14 LAB — RESP PANEL BY RT-PCR (FLU A&B, COVID) ARPGX2
Influenza A by PCR: NEGATIVE
Influenza B by PCR: NEGATIVE
SARS Coronavirus 2 by RT PCR: NEGATIVE

## 2020-10-14 LAB — CBG MONITORING, ED: Glucose-Capillary: 93 mg/dL (ref 70–99)

## 2020-10-14 IMAGING — CT CT HEAD W/O CM
3 series · 15 of 47 positions shown, 18 images · non-contrast
Comparison: MRI [DATE], CT [DATE]

CLINICAL DATA: Seizure

EXAM:
CT HEAD WITHOUT CONTRAST
TECHNIQUE: Contiguous axial images were obtained from the base of the skull
through the vertex without intravenous contrast.

[Series 3: head wo · axial · 0.45mm/px · z∈[-216,-86]mm · 9 of 32 slices shown, 12 images]
[im 3/32  brain]
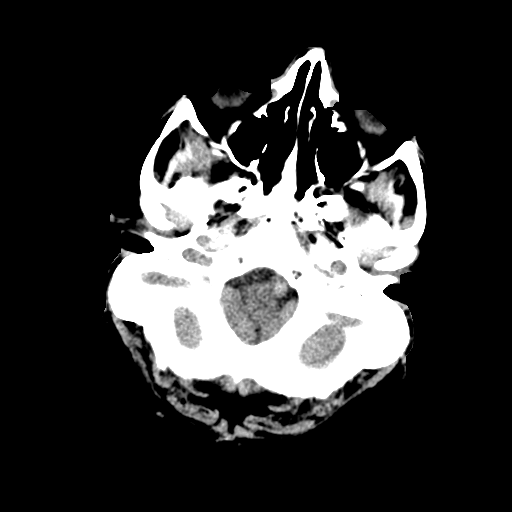
[im 3/32  bone]
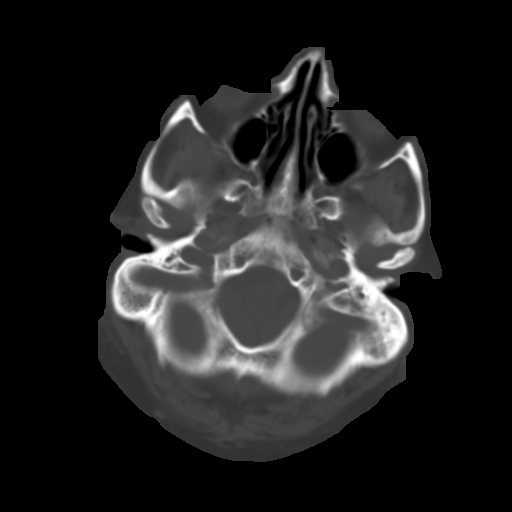
[im 6/32  brain]
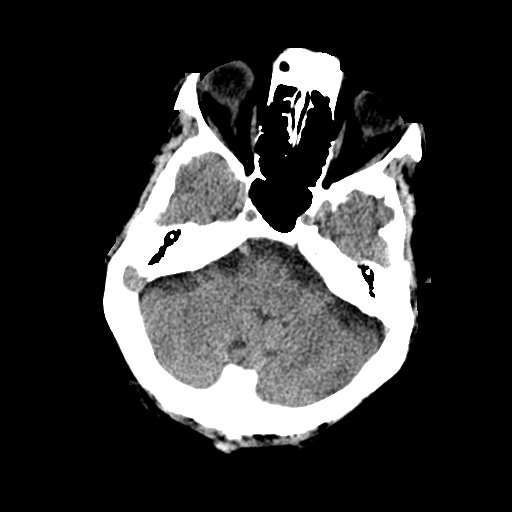
[im 9/32  brain]
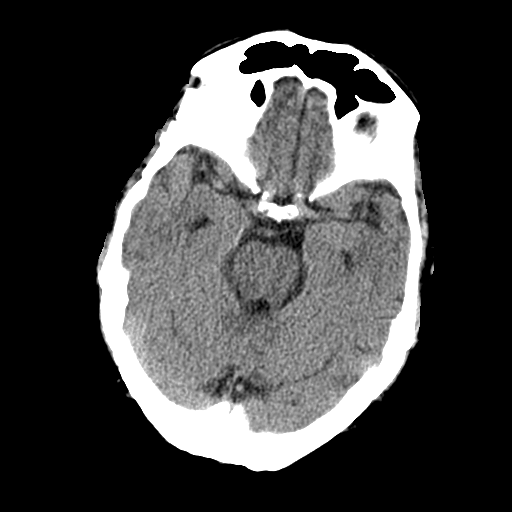
[im 12/32  brain]
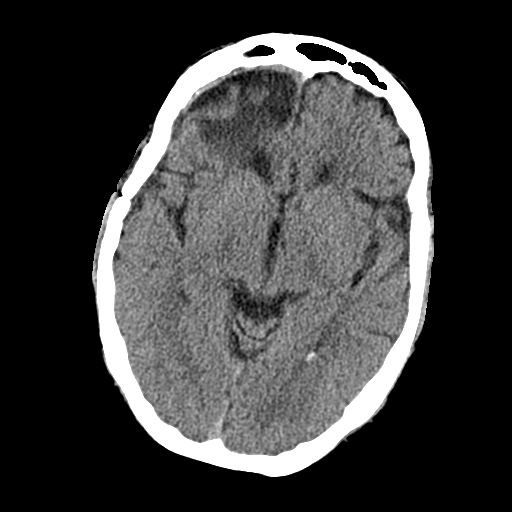
[im 17/32  brain]
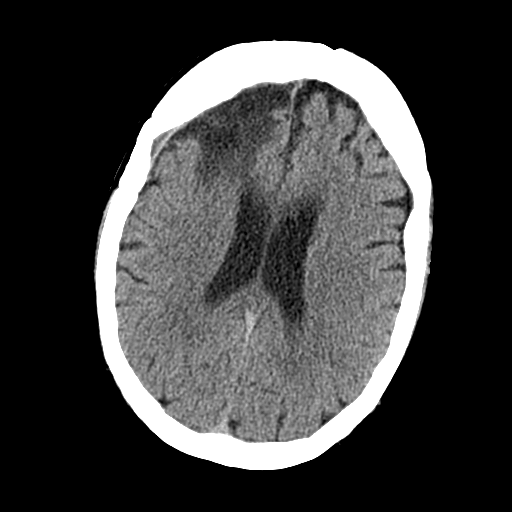
[im 17/32  bone]
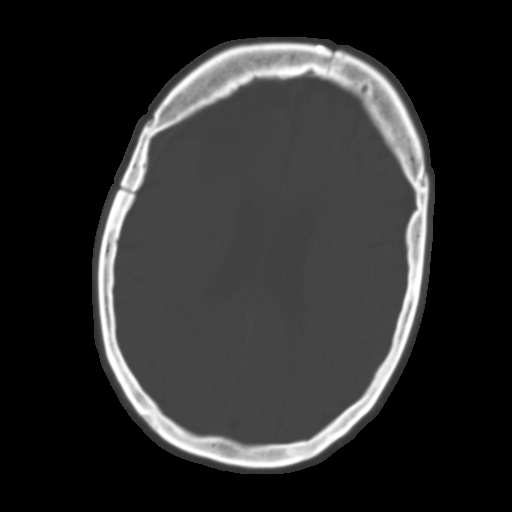
[im 20/32  brain]
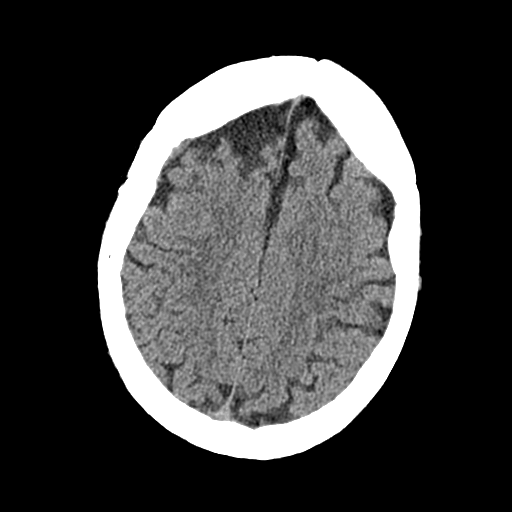
[im 23/32  brain]
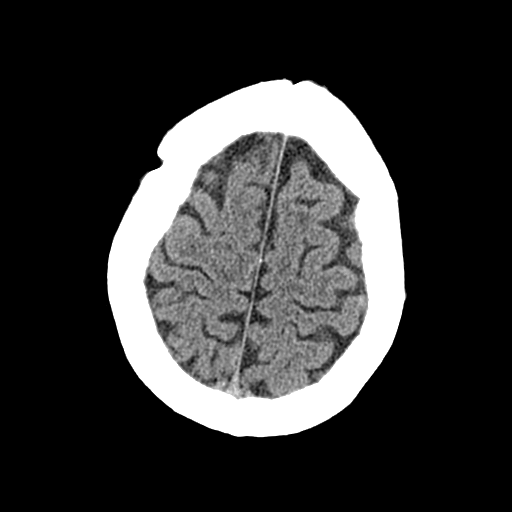
[im 26/32  brain]
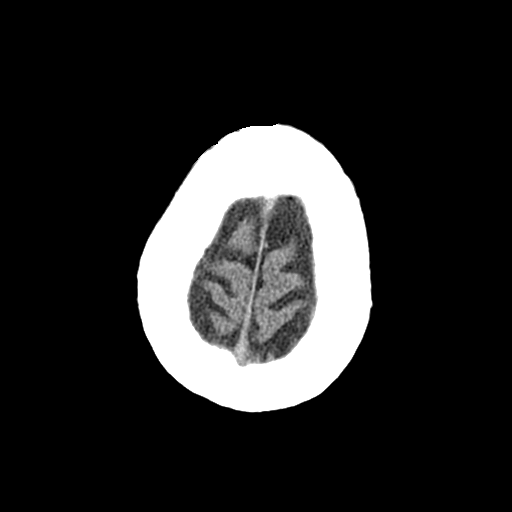
[im 29/32  brain]
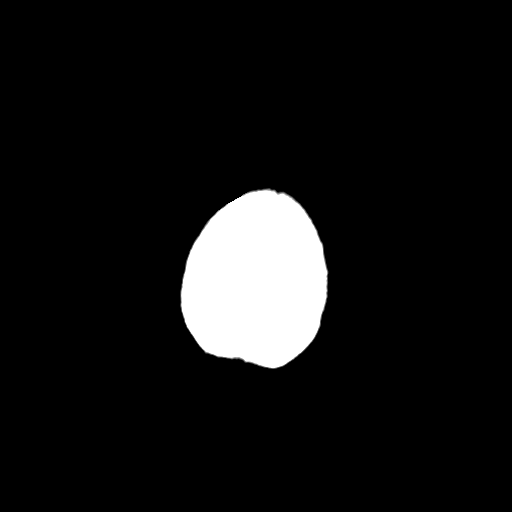
[im 29/32  bone]
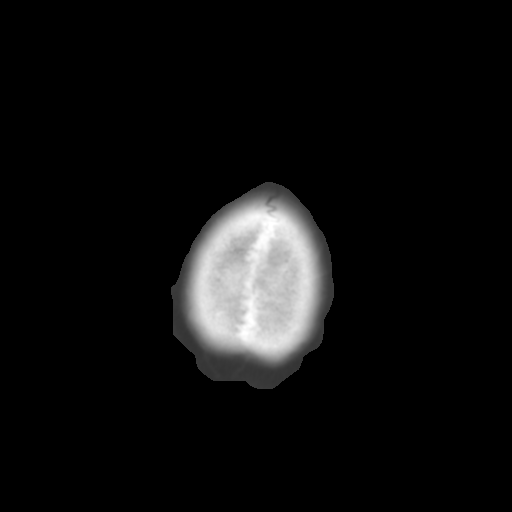

[Series 4: cor soft · coronal · 0.30mm/px · 3 of 72 slices shown]
[im 24/72  brain]
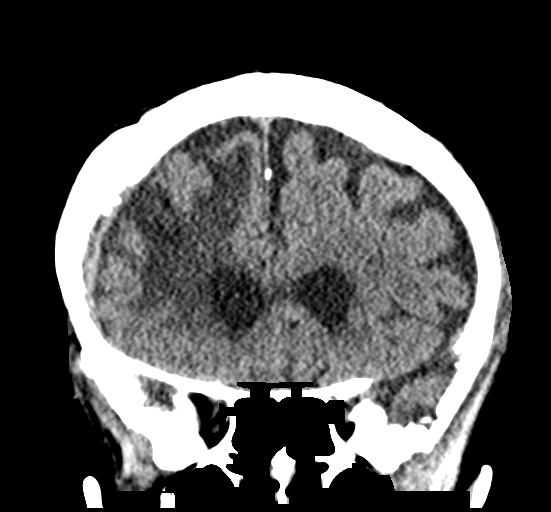
[im 32/72  brain]
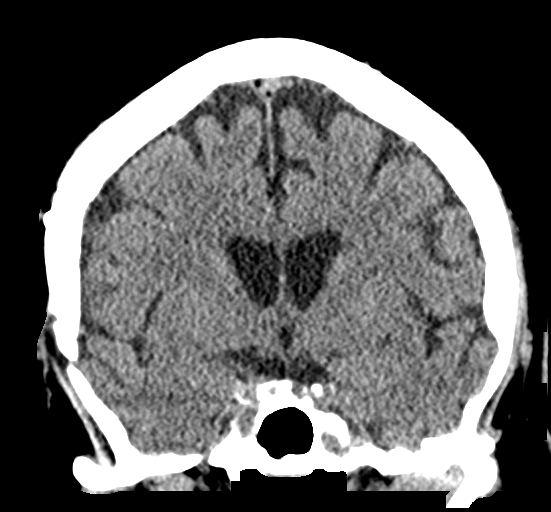
[im 40/72  brain]
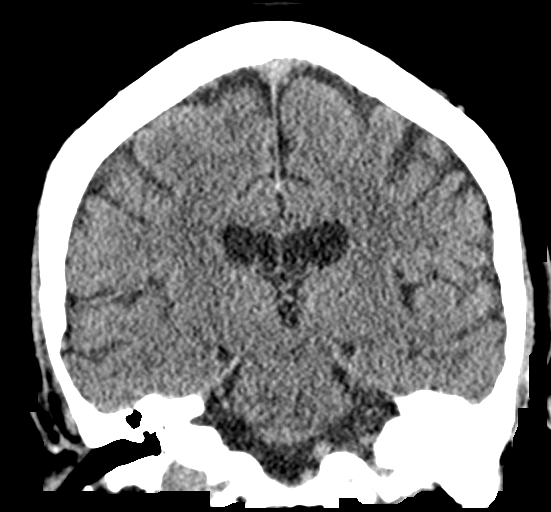

[Series 5: sag soft · sagittal · 0.30mm/px · 3 of 57 slices shown]
[im 19/57  brain]
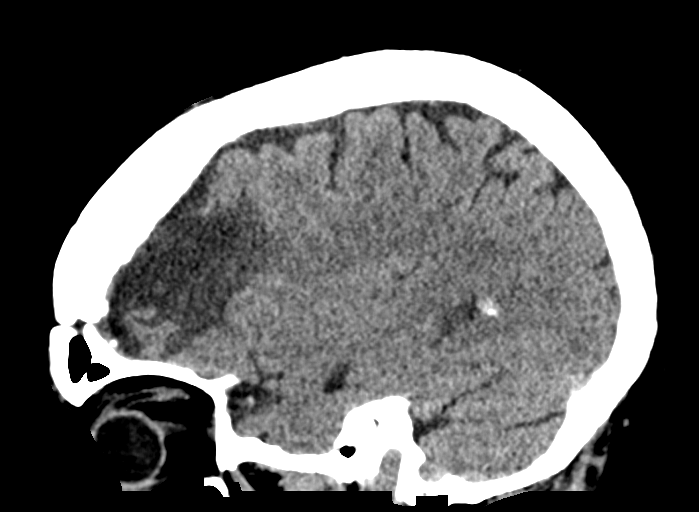
[im 29/57  brain]
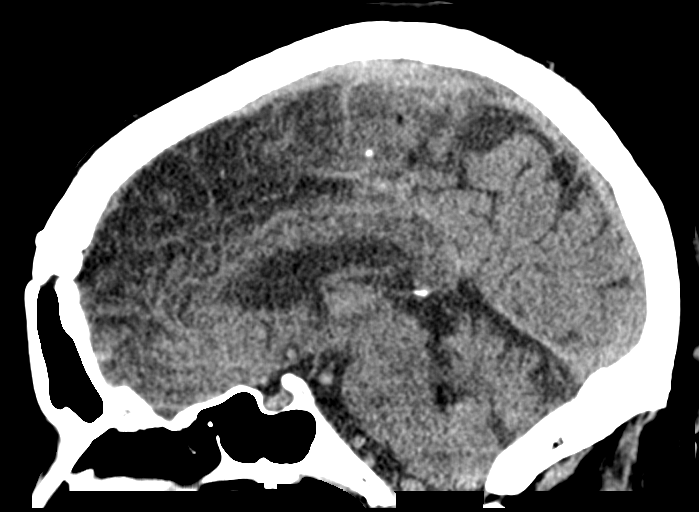
[im 38/57  brain]
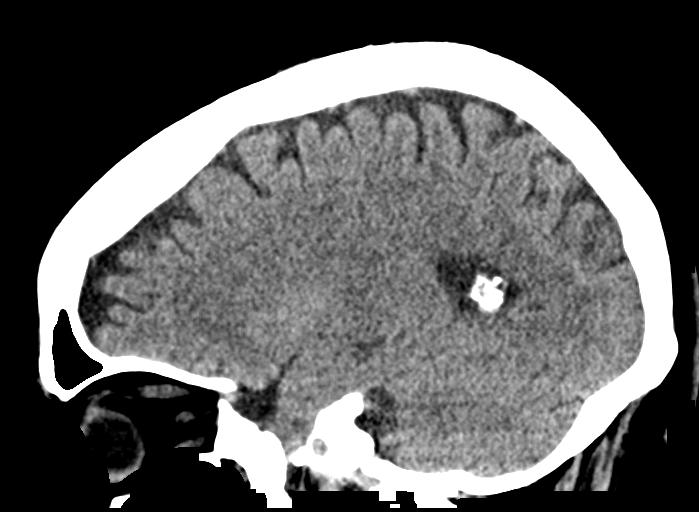

[15 of 47 positions shown; findings below may reference images not displayed]

FINDINGS: Brain: No acute territorial infarction or intracranial hemorrhage is
visualized. Patient is status post resection of right frontal
extra-axial mass. There is encephalomalacia within the right frontal
lobe. There is mild dural thickening on the right with largely
resolved extra-axial collection under the craniotomy. There is mild
atrophy. There is mild hypodensity within the white matter likely
due to chronic small vessel ischemic change. The ventricles are
nonenlarged.

Vascular: No hyperdense vessels.  No unexpected calcification.

Skull: Right frontal parietal craniotomy.  No fracture

Sinuses/Orbits: Mild thickening in the right frontal sinus.

Other: None
IMPRESSION: 1. No CT evidence for acute intracranial abnormality.
2. Status post resection of right frontal extra-axial mass with
encephalomalacia in the right frontal lobe. Previously noted
extra-axial collection deep to the craniotomy has resolved.
3. Atrophy and mild chronic small vessel ischemic changes of the
white matter.

## 2020-10-14 MED ORDER — LORAZEPAM 1 MG PO TABS: 1.0000 mg | ORAL_TABLET | Freq: Once | ORAL | Status: DC

## 2020-10-14 MED ORDER — LACTATED RINGERS IV BOLUS
1000.0000 mL | Freq: Once | INTRAVENOUS | Status: AC
  Administered 2020-10-14: 1000 mL via INTRAVENOUS

## 2020-10-14 NOTE — ED Provider Notes (Signed)
[facility] EMERGENCY DEPARTMENT Provider Note   CSN: [id number] Arrival date & time: 10/14/20  2019     History Chief Complaint  Patient presents with  . Shaking    Jeffery Reed is a 75 y.o. male.  75 year old male with extensive past medical history below including cranial meningioma, CKD who presents with shaking.  This evening around 5 PM during dinner, patient was eating with his wife when he suddenly began having shaking of his mouth.  The shaking was uncontrollable and he was not able to talk during the episode which lasted approximately 1 minute and spontaneously resolved.  He was awake during the entire event and denies any shaking or jerking of extremities.  He has never had an episode like this before and has been in his usual state of health today.  He has had no recent illness.  No recent medication changes although he does state that he ran out of one of his "nerve" medications and has not had his dose of that this morning.  He required Keppra postoperatively after his craniotomy but was eventually taken off of this medication and has never had any problems.  He denies any alcohol or drug problems.  No headache or vision changes.  The history is provided by the patient.       Past Medical History:  Diagnosis Date  . Allergy   . Anxiety   . Cataract    beginning stage 01/22/2019  . Chronic kidney disease    enlarge bladder  . Diabetes mellitus without complication ([place])   . GERD (gastroesophageal reflux disease)   . Gout   . Hyperlipidemia   . Hypertension   . OSA (obstructive sleep apnea)   . Pre-diabetes   . Sleep apnea    c pap    Patient Active Problem List   Diagnosis Date Noted  . Seizure ([place]) 10/14/2020  . Renal mass 01/08/2020  . Noncompliance with CPAP treatment   . Stage 3 chronic kidney disease ([place])   . Steroid-induced hyperglycemia   . Enlarged prostate   . CKD stage 3 due to type 2 diabetes mellitus ([place]) 03/12/2019  . Anxiety  02/18/2018  . GERD (gastroesophageal reflux disease) 03/12/2016  . Obesity (BMI 30.0-34.9) 06/01/2015  . Medication management 08/19/2014  . BPH/Prostatism 08/19/2014  . Essential hypertension 08/17/2013  . Type 2 diabetes mellitus with stage 3 chronic kidney disease, without long-term current use of insulin ([place]) 08/17/2013  . Vitamin D deficiency 08/17/2013  . Hyperlipidemia associated with type 2 diabetes mellitus ([place])   . Testosterone deficiency   . Gout   . OSA (obstructive sleep apnea)     Past Surgical History:  Procedure Laterality Date  . COLONOSCOPY    . CRANIOTOMY Right 12/29/2019   Procedure: Craniotomy for Tumor;  Surgeon: Kristeen Miss, MD;  Location: [facility];  Service: Neurosurgery;  Laterality: Right;  Craniotomy for Tumor Excision  . FOOT SURGERY  10/2009  . MYRINGOTOMY Left 02-2005   Dr.Newman  . TONSILLECTOMY  1953       Family History  Problem Relation Age of Onset  . Hypertension Mother   . GER disease Mother   . Hyperlipidemia Father   . Hypertension Father   . Parkinson's disease Father   . Hypertension Brother   . Diabetes Maternal Grandmother   . Stroke Paternal Grandmother   . Diabetes Paternal Grandmother   . Stroke Paternal Grandfather   . Colon cancer Neg Hx   . Colon polyps  Neg Hx   . Esophageal cancer Neg Hx   . Stomach cancer Neg Hx   . Rectal cancer Neg Hx     Social History   Tobacco Use  . Smoking status: Never Smoker  . Smokeless tobacco: Never Used  Vaping Use  . Vaping Use: Never used  Substance Use Topics  . Alcohol use: No  . Drug use: No    Home Medications Prior to Admission medications   Medication Sig Start Date End Date Taking? Authorizing Provider  acetaminophen (TYLENOL) 650 MG CR tablet Take 650 mg by mouth every 8 (eight) hours as needed for pain (or headaches).    [provider]  Cholecalciferol (VITAMIN D3) 10000 units capsule Take 10,000 Units by mouth at bedtime.     [provider]   citalopram (CELEXA) 20 MG tablet TAKE 1 TABLET BY MOUTH  DAILY FOR MOOD 05/18/20   Garnet Sierras, NP  fenofibrate 160 MG tablet Take      1 tablet      Daily       for Triglycerides (Blood Fats) 05/20/20   Liane Comber, NP  fexofenadine (ALLEGRA) 180 MG tablet Take 180 mg by mouth daily as needed for allergies or rhinitis.    [provider]  fluticasone (FLONASE) 50 MCG/ACT nasal spray Use 2 sprays each nostril  /daily Patient taking differently: Place 2 sprays into both nostrils daily as needed for allergies or rhinitis. 04/18/18   Unk Pinto, MD  glipiZIDE (GLUCOTROL) 5 MG tablet TAKE 1 TO 2 TABLETS BY  MOUTH 3 TIMES A DAY WITH  MEALS FOR DIABETES 07/27/20   Garnet Sierras, NP  glucose blood (FREESTYLE LITE) test strip Check blood sugar 1 time daily-DX-E11.22 01/30/20   Vladimir Crofts, PA-C  metFORMIN (GLUCOPHAGE-XR) 500 MG 24 hr tablet Take 2 tablets (1,000 mg total) by mouth 2 (two) times daily with a meal. 02/17/20   Philemon Kingdom, MD  mupirocin ointment (BACTROBAN) 2 % APPLY TWICE A DAY TO SKIN INFECTION. 09/23/20   Unk Pinto, MD  omeprazole (PRILOSEC) 40 MG capsule Take     1 capsule     Daily      to Prevent Indigestion & Heartburn 05/20/20   Liane Comber, NP  polyethylene glycol (MIRALAX / GLYCOLAX) 17 g packet Take 17 g by mouth daily as needed for mild constipation. 01/02/20   Kayleen Memos, DO  rosuvastatin (CRESTOR) 10 MG tablet TAKE 1 TABLET BY MOUTH  DAILY FOR CHOLESTEROL 10/02/20   Liane Comber, NP  valsartan-hydrochlorothiazide (DIOVAN-HCT) 320-25 MG tablet TAKE 1 TABLET BY MOUTH  DAILY FOR BLOOD PRESSURE 10/02/20   Liane Comber, NP    Allergies    Cardura [doxazosin mesylate], Codeine, and Hytrin [terazosin]  Review of Systems   Review of Systems All other systems reviewed and are negative except that which was mentioned in HPI  Physical Exam Updated Vital Signs BP 131/65 (BP Location: Right Arm)   Pulse 67   Temp 98.1 F (36.7 C)  (Oral)   Resp 18   Ht 5\' 10"  (1.778 m)   Wt 97.3 kg   SpO2 92%   BMI 30.76 kg/m   Physical Exam Vitals and nursing note reviewed.  Constitutional:      General: He is not in acute distress.    Appearance: He is well-developed.     Comments: Awake, alert  HENT:     Head: Normocephalic and atraumatic.     Mouth/Throat:     Mouth: Mucous  membranes are moist.     Pharynx: Oropharynx is clear.  Eyes:     Extraocular Movements: Extraocular movements intact.     Conjunctiva/sclera: Conjunctivae normal.     Pupils: Pupils are equal, round, and reactive to light.  Cardiovascular:     Rate and Rhythm: Normal rate and regular rhythm.     Heart sounds: Normal heart sounds. No murmur heard.   Pulmonary:     Effort: Pulmonary effort is normal. No respiratory distress.     Breath sounds: Normal breath sounds.  Abdominal:     General: Bowel sounds are normal. There is no distension.     Palpations: Abdomen is soft.     Tenderness: There is no abdominal tenderness.  Musculoskeletal:     Cervical back: Neck supple.  Skin:    General: Skin is warm and dry.  Neurological:     Mental Status: He is alert and oriented to person, place, and time.     Cranial Nerves: No cranial nerve deficit.     Motor: No abnormal muscle tone.     Deep Tendon Reflexes: Reflexes are normal and symmetric.     Comments: Fluent speech, normal finger-to-nose testing, negative pronator drift, no clonus 5/5 strength and normal sensation x all 4 extremities  Psychiatric:        Thought Content: Thought content normal.        Judgment: Judgment normal.     ED Results / Procedures / Treatments   Labs (all labs ordered are listed, but only abnormal results are displayed) Labs Reviewed  BASIC METABOLIC PANEL - Abnormal; Notable for the following components:      Result Value   BUN 36 (*)    Creatinine, Ser 2.04 (*)    GFR, Estimated 34 (*)    All other components within normal limits  RESP PANEL BY RT-PCR  (FLU A&B, COVID) ARPGX2  CBC WITH DIFFERENTIAL/PLATELET  CBG MONITORING, ED    EKG EKG Interpretation  Date/Time:  Thursday October 14 2020 21:17:00 EST Ventricular Rate:  78 PR Interval:    QRS Duration: 104 QT Interval:  400 QTC Calculation: 456 R Axis:   101 Text Interpretation: Sinus rhythm Right axis deviation No significant change since last tracing Confirmed by Theotis Burrow [phone]) on 10/14/2020 10:22:54 PM   Radiology CT Head Wo Contrast  Result Date: 10/14/2020 CLINICAL DATA:  Seizure EXAM: CT HEAD WITHOUT CONTRAST TECHNIQUE: Contiguous axial images were obtained from the base of the skull through the vertex without intravenous contrast. COMPARISON:  MRI 04/29/2020, CT 12/31/2019 FINDINGS: Brain: No acute territorial infarction or intracranial hemorrhage is visualized. Patient is status post resection of right frontal extra-axial mass. There is encephalomalacia within the right frontal lobe. There is mild dural thickening on the right with largely resolved extra-axial collection under the craniotomy. There is mild atrophy. There is mild hypodensity within the white matter likely due to chronic small vessel ischemic change. The ventricles are nonenlarged. Vascular: No hyperdense vessels.  No unexpected calcification. Skull: Right frontal parietal craniotomy.  No fracture Sinuses/Orbits: Mild thickening in the right frontal sinus. Other: None IMPRESSION: 1. No CT evidence for acute intracranial abnormality. 2. Status post resection of right frontal extra-axial mass with encephalomalacia in the right frontal lobe. Previously noted extra-axial collection deep to the craniotomy has resolved. 3. Atrophy and mild chronic small vessel ischemic changes of the white matter. Electronically Signed   By: Donavan Foil M.D.   On: 10/14/2020 21:21    Procedures Procedures  Medications Ordered in ED Medications  lactated ringers bolus 1,000 mL (0 mLs Intravenous Stopped 10/14/20 2305)     ED Course  I have reviewed the triage vital signs and the nursing notes.  Pertinent labs & imaging results that were available during my care of the patient were reviewed by me and considered in my medical decision making (see chart for details).    MDM Rules/Calculators/A&P                          Alert, neurologically intact on exam.  No focal neurologic deficits.  His description of symptoms does raise the possibility of partial seizure.  Head CT shows postsurgical changes with no acute findings.  Lab work notable for creatinine of 2, baseline is around 1.5, otherwise unremarkable.  Gave IV fluid bolus.  Discussed with neurology, Dr. Curly Shores, who recommended admission for MRI, EEG, and possible AED. Discussed admission w/ [facility], Dr. Tonie Griffith.  Final Clinical Impression(s) / ED Diagnoses Final diagnoses:  Episode of shaking    Rx / DC Orders ED Discharge Orders    None       Laiya Wisby, Wenda Overland, MD 10/15/20 0003

## 2020-10-14 NOTE — ED Notes (Signed)
Spoke to patient's daughter; update given; states she will stay in parking lot for now and call for updates.

## 2020-10-14 NOTE — ED Triage Notes (Signed)
Pt states ~5pm he was eating when he started "shaking-mumble talking but I did have food in my mouth"-states event lasted ~1 minute-does have total recall-states he has hx of "brain tumor surgery last May"-NAD-steady gait

## 2020-10-14 NOTE — ED Notes (Signed)
Patient transported to CT 

## 2020-10-15 ENCOUNTER — Observation Stay (HOSPITAL_COMMUNITY): Payer: Medicare Other

## 2020-10-15 DIAGNOSIS — I129 Hypertensive chronic kidney disease with stage 1 through stage 4 chronic kidney disease, or unspecified chronic kidney disease: Secondary | ICD-10-CM | POA: Diagnosis present

## 2020-10-15 DIAGNOSIS — Z79899 Other long term (current) drug therapy: Secondary | ICD-10-CM | POA: Diagnosis not present

## 2020-10-15 DIAGNOSIS — E1169 Type 2 diabetes mellitus with other specified complication: Secondary | ICD-10-CM | POA: Diagnosis not present

## 2020-10-15 DIAGNOSIS — Z820 Family history of epilepsy and other diseases of the nervous system: Secondary | ICD-10-CM | POA: Diagnosis not present

## 2020-10-15 DIAGNOSIS — Z9114 Patient's other noncompliance with medication regimen: Secondary | ICD-10-CM

## 2020-10-15 DIAGNOSIS — Z7984 Long term (current) use of oral hypoglycemic drugs: Secondary | ICD-10-CM | POA: Diagnosis not present

## 2020-10-15 DIAGNOSIS — N189 Chronic kidney disease, unspecified: Secondary | ICD-10-CM

## 2020-10-15 DIAGNOSIS — R251 Tremor, unspecified: Secondary | ICD-10-CM | POA: Diagnosis not present

## 2020-10-15 DIAGNOSIS — Z86011 Personal history of benign neoplasm of the brain: Secondary | ICD-10-CM | POA: Diagnosis not present

## 2020-10-15 DIAGNOSIS — N1831 Chronic kidney disease, stage 3a: Secondary | ICD-10-CM

## 2020-10-15 DIAGNOSIS — I10 Essential (primary) hypertension: Secondary | ICD-10-CM | POA: Diagnosis not present

## 2020-10-15 DIAGNOSIS — F419 Anxiety disorder, unspecified: Secondary | ICD-10-CM | POA: Diagnosis present

## 2020-10-15 DIAGNOSIS — G9389 Other specified disorders of brain: Secondary | ICD-10-CM | POA: Diagnosis not present

## 2020-10-15 DIAGNOSIS — Z888 Allergy status to other drugs, medicaments and biological substances status: Secondary | ICD-10-CM | POA: Diagnosis not present

## 2020-10-15 DIAGNOSIS — E785 Hyperlipidemia, unspecified: Secondary | ICD-10-CM | POA: Diagnosis present

## 2020-10-15 DIAGNOSIS — M109 Gout, unspecified: Secondary | ICD-10-CM | POA: Diagnosis present

## 2020-10-15 DIAGNOSIS — G4733 Obstructive sleep apnea (adult) (pediatric): Secondary | ICD-10-CM

## 2020-10-15 DIAGNOSIS — N179 Acute kidney failure, unspecified: Secondary | ICD-10-CM | POA: Diagnosis present

## 2020-10-15 DIAGNOSIS — Z833 Family history of diabetes mellitus: Secondary | ICD-10-CM | POA: Diagnosis not present

## 2020-10-15 DIAGNOSIS — K219 Gastro-esophageal reflux disease without esophagitis: Secondary | ICD-10-CM | POA: Diagnosis present

## 2020-10-15 DIAGNOSIS — R569 Unspecified convulsions: Secondary | ICD-10-CM | POA: Diagnosis present

## 2020-10-15 DIAGNOSIS — R350 Frequency of micturition: Secondary | ICD-10-CM | POA: Diagnosis present

## 2020-10-15 DIAGNOSIS — H269 Unspecified cataract: Secondary | ICD-10-CM | POA: Diagnosis present

## 2020-10-15 DIAGNOSIS — Z885 Allergy status to narcotic agent status: Secondary | ICD-10-CM | POA: Diagnosis not present

## 2020-10-15 DIAGNOSIS — Z83438 Family history of other disorder of lipoprotein metabolism and other lipidemia: Secondary | ICD-10-CM | POA: Diagnosis not present

## 2020-10-15 DIAGNOSIS — E1122 Type 2 diabetes mellitus with diabetic chronic kidney disease: Secondary | ICD-10-CM

## 2020-10-15 DIAGNOSIS — Z20822 Contact with and (suspected) exposure to covid-19: Secondary | ICD-10-CM | POA: Diagnosis present

## 2020-10-15 DIAGNOSIS — N401 Enlarged prostate with lower urinary tract symptoms: Secondary | ICD-10-CM | POA: Diagnosis present

## 2020-10-15 DIAGNOSIS — Z8249 Family history of ischemic heart disease and other diseases of the circulatory system: Secondary | ICD-10-CM | POA: Diagnosis not present

## 2020-10-15 DIAGNOSIS — N1832 Chronic kidney disease, stage 3b: Secondary | ICD-10-CM | POA: Diagnosis not present

## 2020-10-15 DIAGNOSIS — Z823 Family history of stroke: Secondary | ICD-10-CM | POA: Diagnosis not present

## 2020-10-15 DIAGNOSIS — G319 Degenerative disease of nervous system, unspecified: Secondary | ICD-10-CM | POA: Diagnosis not present

## 2020-10-15 LAB — BASIC METABOLIC PANEL
Anion gap: 12 (ref 5–15)
BUN: 30 mg/dL — ABNORMAL HIGH (ref 8–23)
CO2: 25 mmol/L (ref 22–32)
Calcium: 9.4 mg/dL (ref 8.9–10.3)
Chloride: 102 mmol/L (ref 98–111)
Creatinine, Ser: 1.84 mg/dL — ABNORMAL HIGH (ref 0.61–1.24)
GFR, Estimated: 38 mL/min — ABNORMAL LOW (ref >60)
Glucose, Bld: 119 mg/dL — ABNORMAL HIGH (ref 70–99)
Potassium: 3.3 mmol/L — ABNORMAL LOW (ref 3.5–5.1)
Sodium: 139 mmol/L (ref 135–145)

## 2020-10-15 LAB — CBC
HCT: 37 % — ABNORMAL LOW (ref 39.0–52.0)
Hemoglobin: 12.8 g/dL — ABNORMAL LOW (ref 13.0–17.0)
MCH: 29.6 pg (ref 26.0–34.0)
MCHC: 34.6 g/dL (ref 30.0–36.0)
MCV: 85.6 fL (ref 80.0–100.0)
Platelets: 168 10*3/uL (ref 150–400)
RBC: 4.32 MIL/uL (ref 4.22–5.81)
RDW: 14 % (ref 11.5–15.5)
WBC: 7.8 10*3/uL (ref 4.0–10.5)
nRBC: 0 % (ref 0.0–0.2)

## 2020-10-15 LAB — GLUCOSE, CAPILLARY
Glucose-Capillary: 221 mg/dL — ABNORMAL HIGH (ref 70–99)
Glucose-Capillary: 183 mg/dL — ABNORMAL HIGH (ref 70–99)
Glucose-Capillary: 150 mg/dL — ABNORMAL HIGH (ref 70–99)
Glucose-Capillary: 136 mg/dL — ABNORMAL HIGH (ref 70–99)
Glucose-Capillary: 161 mg/dL — ABNORMAL HIGH (ref 70–99)

## 2020-10-15 IMAGING — MR MR HEAD WO/W CM
14 of 16 series · 40 of 48 positions shown · IV contrast (gadavist)
Comparison: [DATE].  [DATE].

CLINICAL DATA: Acute presentation with shaking. History of
meningioma resection.

EXAM:
MRI HEAD WITHOUT AND WITH CONTRAST
TECHNIQUE: Multiplanar, multiecho pulse sequences of the brain and surrounding
structures were obtained without and with intravenous contrast.
CONTRAST:  10mL GADAVIST GADOBUTROL 1 MMOL/ML IV SOLN

[Series 5: DWI · axial · 3.0mm · 0.88mm/px · z∈[-80,+67]mm · 6 of 100 slices shown (1 of 4)]
[im 1/100]
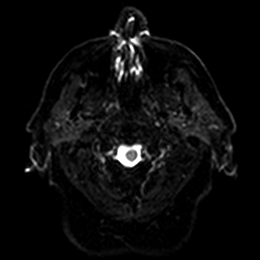
[im 20/100]
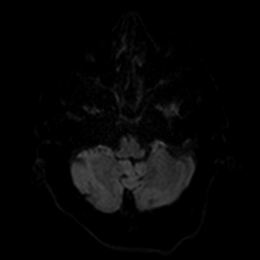
[im 40/100]
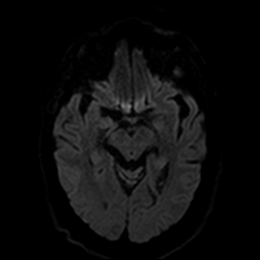
[im 60/100]
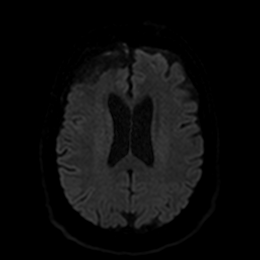
[im 80/100]
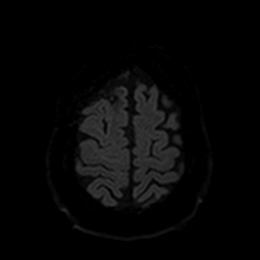
[im 100/100]
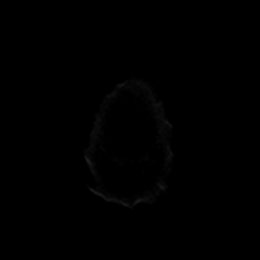

[Series 6: DWI · axial · 3.0mm · 0.88mm/px · z∈[-80,+67]mm · 2 of 50 slices shown (2 of 4)]
[im 1/50]
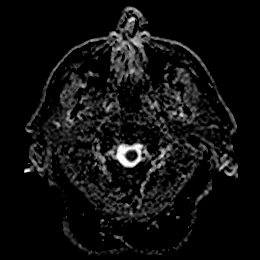
[im 50/50]
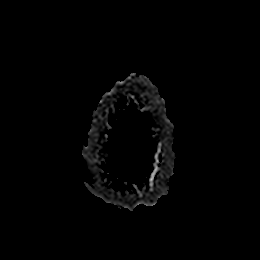

[Series 7: DWI · coronal · 4.0mm · 0.88mm/px · 4 of 68 slices shown (3 of 4)]
[im 1/68]
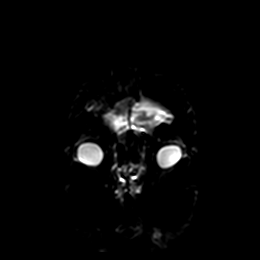
[im 23/68]
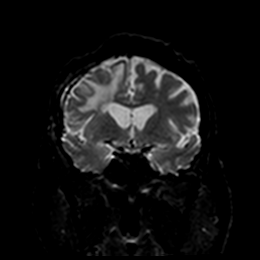
[im 45/68]
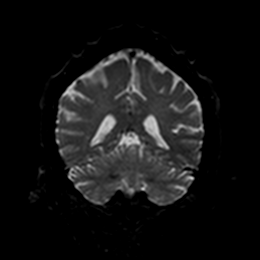
[im 68/68]
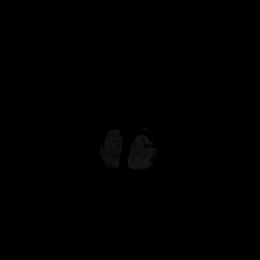

[Series 8: DWI · coronal · 4.0mm · 0.88mm/px · 2 of 34 slices shown (4 of 4)]
[im 1/34]
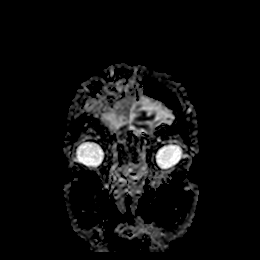
[im 34/34]
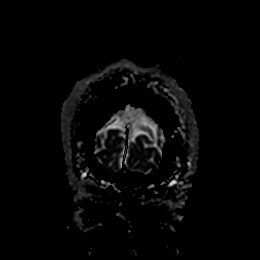

[Series 9: T1 · sagittal · 5.0mm · 0.75mm/px · 2 of 24 slices shown]
[im 1/24]
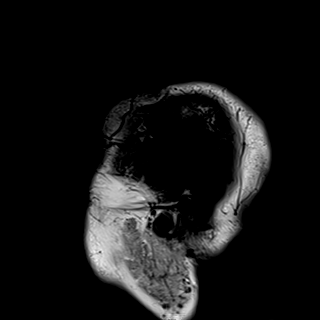
[im 24/24]
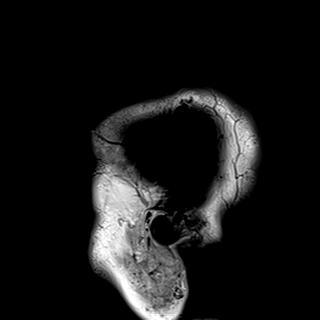

[Series 10: T2 · axial · 5.0mm · 0.72mm/px · z∈[-79,+65]mm · 2 of 25 slices shown]
[im 1/25]
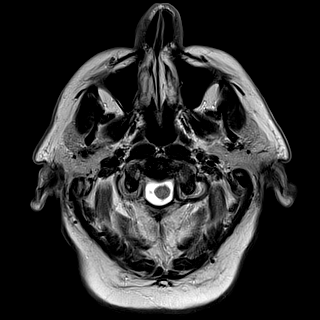
[im 25/25]
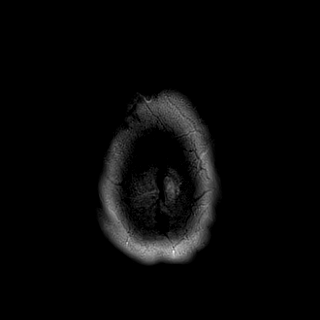

[Series 11: FLAIR · axial · 5.0mm · 0.45mm/px · z∈[-78,+66]mm · 2 of 25 slices shown]
[im 1/25]
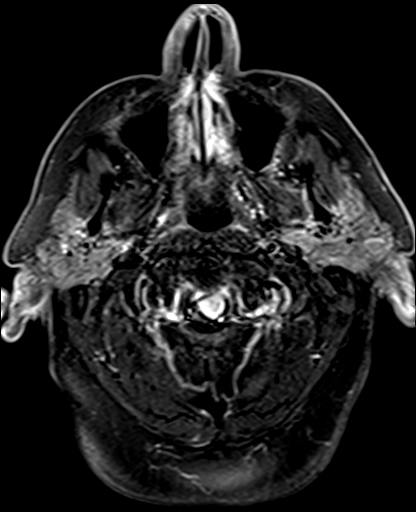
[im 25/25]
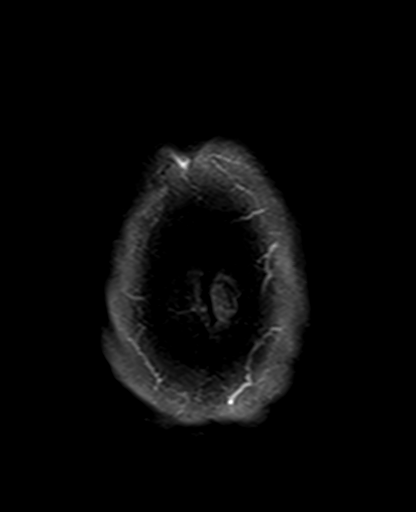

[Series 12: mag_images · axial · 3.0mm · 0.90mm/px · z∈[-83,+70]mm · 4 of 52 slices shown]
[im 1/52]
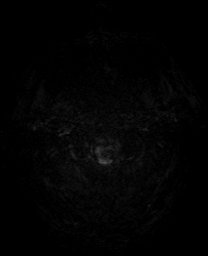
[im 18/52]
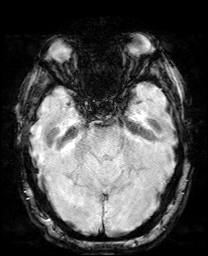
[im 35/52]
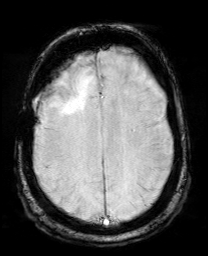
[im 52/52]
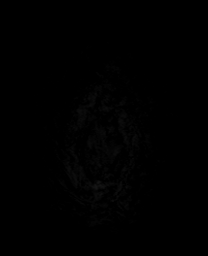

[Series 13: pha_images · axial · 3.0mm · 0.90mm/px · z∈[-80,+70]mm · 3 of 51 slices shown]
[im 1/51]
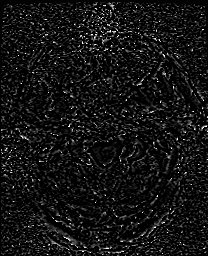
[im 26/51]
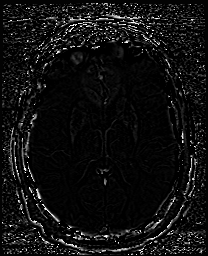
[im 51/51]
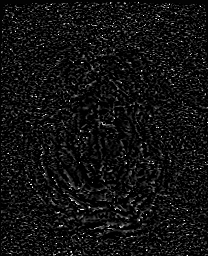

[Series 14: swi_images · axial · 3.0mm · 0.90mm/px · z∈[-83,+70]mm · 4 of 52 slices shown]
[im 1/52]
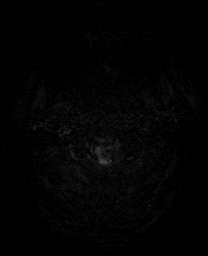
[im 18/52]
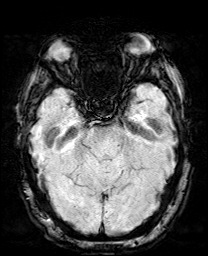
[im 35/52]
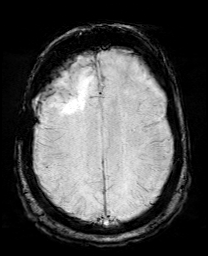
[im 52/52]
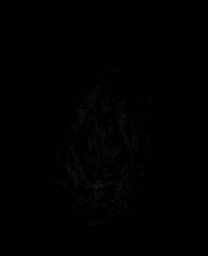

[Series 15: mip_images(sw) · axial · 24.0mm · 0.90mm/px · z∈[-72,+60]mm · 3 of 45 slices shown]
[im 1/45]
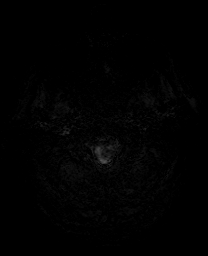
[im 23/45]
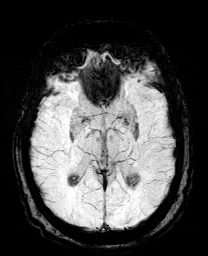
[im 45/45]
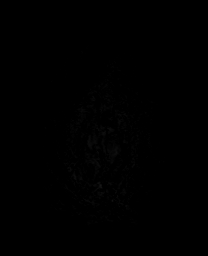

[Series 17: T2 post-contrast · coronal · 5.0mm · 0.72mm/px · 2 of 29 slices shown]
[im 1/29]
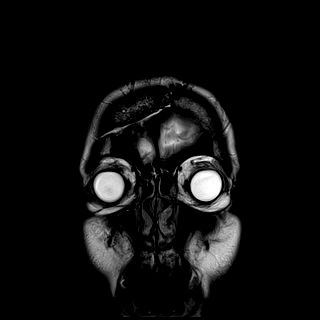
[im 29/29]
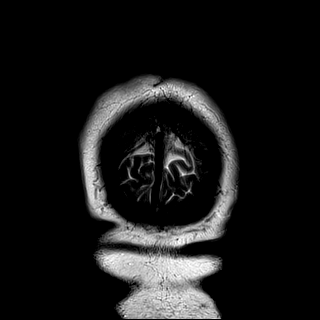

[Series 19: T1 post-contrast · coronal · 5.0mm · 0.34mm/px · 2 of 29 slices shown (1 of 2)]
[im 1/29]
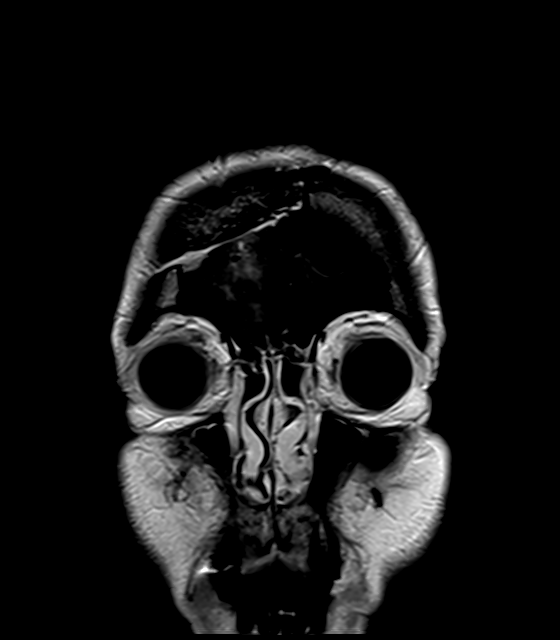
[im 29/29]
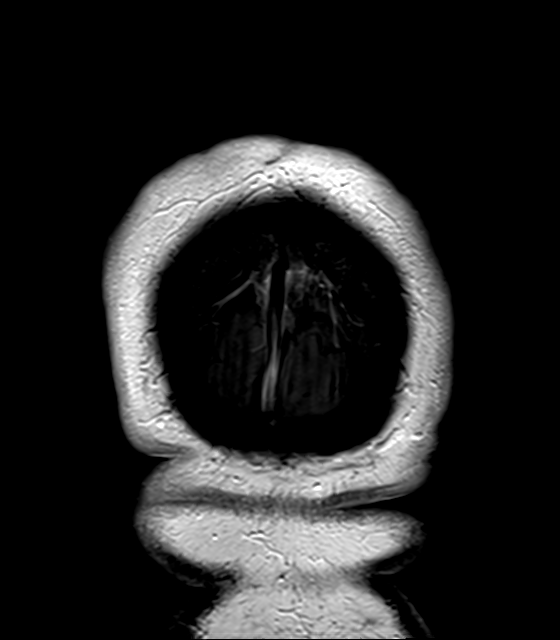

[Series 20: T1 post-contrast · sagittal · 5.0mm · 0.72mm/px · 2 of 24 slices shown (2 of 2)]
[im 1/24]
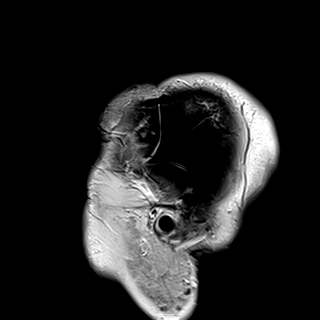
[im 24/24]
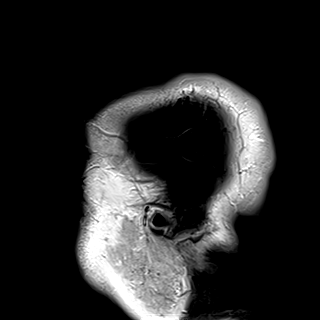

[40 of 48 positions shown; findings below may reference images not displayed]

FINDINGS: Brain: Diffusion imaging does not show any acute or subacute
infarction. The brainstem and cerebellum are normal. Left cerebral
hemispheres normal except for a few punctate white matter foci. No
obstructive hydrocephalus.

Previous right frontal craniotomy for resection of a large
meningioma. No evidence of residual or recurrent tumor. Right
frontal atrophy, encephalomalacia and gliosis. Small amount of
extra-axial fluid along the craniotomy bone is slightly reduced. No
sign of residual or recurrent tumor.

Vascular: Major vessels at the base of the brain show flow.

Skull and upper cervical spine: Negative other than the craniotomy
changes.

Sinuses/Orbits: Clear/normal

Other: None
IMPRESSION: Previous right frontal craniotomy for resection of a large
meningioma. No evidence of residual or recurrent tumor. Right
frontal atrophy, encephalomalacia and gliosis. Small amount of
extra-axial fluid along the craniotomy bone is slightly reduced. No
acute finding or any worsening/progressive finding.

## 2020-10-15 MED ORDER — GADOBUTROL 1 MMOL/ML IV SOLN
10.0000 mL | Freq: Once | INTRAVENOUS | Status: AC | PRN
  Administered 2020-10-15: 10 mL via INTRAVENOUS

## 2020-10-15 MED ORDER — LORAZEPAM 2 MG/ML IJ SOLN: 2.0000 mg | Freq: Once | INTRAMUSCULAR | Status: DC | PRN

## 2020-10-15 MED ORDER — ROSUVASTATIN CALCIUM 5 MG PO TABS
10.0000 mg | ORAL_TABLET | Freq: Every day | ORAL | Status: DC
  Administered 2020-10-15 – 2020-10-16 (×2): 10 mg via ORAL
  Filled 2020-10-15 (×2): qty 2

## 2020-10-15 MED ORDER — CITALOPRAM HYDROBROMIDE 10 MG PO TABS
20.0000 mg | ORAL_TABLET | Freq: Every day | ORAL | Status: DC
  Administered 2020-10-15 – 2020-10-16 (×2): 20 mg via ORAL
  Filled 2020-10-15 (×4): qty 2

## 2020-10-15 MED ORDER — LEVETIRACETAM 500 MG PO TABS
500.0000 mg | ORAL_TABLET | Freq: Two times a day (BID) | ORAL | Status: DC
  Administered 2020-10-15 – 2020-10-16 (×2): 500 mg via ORAL
  Filled 2020-10-15 (×2): qty 1

## 2020-10-15 MED ORDER — INSULIN ASPART 100 UNIT/ML ~~LOC~~ SOLN: 0.0000 [IU] | Freq: Every day | SUBCUTANEOUS | Status: DC

## 2020-10-15 MED ORDER — POTASSIUM CHLORIDE CRYS ER 20 MEQ PO TBCR
40.0000 meq | EXTENDED_RELEASE_TABLET | Freq: Once | ORAL | Status: AC
  Administered 2020-10-15: 40 meq via ORAL
  Filled 2020-10-15: qty 2

## 2020-10-15 MED ORDER — FENOFIBRATE 160 MG PO TABS
160.0000 mg | ORAL_TABLET | Freq: Every day | ORAL | Status: DC
  Administered 2020-10-15 – 2020-10-16 (×2): 160 mg via ORAL
  Filled 2020-10-15 (×2): qty 1

## 2020-10-15 MED ORDER — INSULIN ASPART 100 UNIT/ML ~~LOC~~ SOLN
0.0000 [IU] | Freq: Three times a day (TID) | SUBCUTANEOUS | Status: DC
  Administered 2020-10-15 (×2): 2 [IU] via SUBCUTANEOUS
  Administered 2020-10-15: 5 [IU] via SUBCUTANEOUS
  Administered 2020-10-16: 2 [IU] via SUBCUTANEOUS

## 2020-10-15 MED ORDER — ENOXAPARIN SODIUM 40 MG/0.4ML ~~LOC~~ SOLN
40.0000 mg | SUBCUTANEOUS | Status: DC
  Administered 2020-10-15 – 2020-10-16 (×2): 40 mg via SUBCUTANEOUS
  Filled 2020-10-15 (×2): qty 0.4

## 2020-10-15 MED ORDER — VITAMIN B-6 50 MG PO TABS
50.0000 mg | ORAL_TABLET | Freq: Every day | ORAL | Status: DC
  Administered 2020-10-15 – 2020-10-16 (×2): 50 mg via ORAL
  Filled 2020-10-15 (×2): qty 1

## 2020-10-15 MED ORDER — PANTOPRAZOLE SODIUM 40 MG PO TBEC
40.0000 mg | DELAYED_RELEASE_TABLET | Freq: Every day | ORAL | Status: DC
  Administered 2020-10-15 – 2020-10-16 (×2): 40 mg via ORAL
  Filled 2020-10-15 (×2): qty 1

## 2020-10-15 MED ORDER — SODIUM CHLORIDE 0.9 % IV SOLN: INTRAVENOUS | Status: AC

## 2020-10-15 MED ORDER — ACETAMINOPHEN 325 MG PO TABS: 650.0000 mg | ORAL_TABLET | Freq: Three times a day (TID) | ORAL | Status: DC | PRN

## 2020-10-15 NOTE — Plan of Care (Signed)
Chart reviewed Per signout EEG normal. No new recommendations Kindly call neurology as needed. Discussed with Dr. Starla Link.  -- Amie Portland, MD Neurologist

## 2020-10-15 NOTE — Progress Notes (Signed)
New Admission Note:  Arrival Method: [facility] Mental Orientation: A&O X4 Telemetry: yes [id number] Assessment: Completed Skin: Dry IV: right FA Pain: None Safety Measures: Safety Fall Prevention Plan was given, discussed. Admission: Completed 3W: Patient has been orientated to the room, unit and the staff. Family: Not at bedside  Orders have been reviewed and implemented. Will continue to monitor the patient. Call light has been placed within reach and bed alarm has been activated.   Lacretia Leigh, RN

## 2020-10-15 NOTE — Procedures (Signed)
Patient Name: FINIAN HELVEY  MRN: [id number]  Epilepsy Attending: Lora Havens  Referring Physician/Provider: Dr Lesleigh Noe Date: 10/15/2020 Duration: 28.26 mins  Patient history: 75 y.o. male with a past medical history significant for right frontal meningioma s/p resection (12/2019) with transient involuntary chewing movements. EEG to evaluate for seizure   Level of alertness: Awake  AEDs during EEG study: LEV  Technical aspects: This EEG study was done with scalp electrodes positioned according to the 10-20 International system of electrode placement. Electrical activity was acquired at a sampling rate of 500Hz  and reviewed with a high frequency filter of 70Hz  and a low frequency filter of 1Hz . EEG data were recorded continuously and digitally stored.   Description: The posterior dominant rhythm consists of 9 Hz activity of moderate voltage (25-35 uV) seen predominantly in posterior head regions, symmetric and reactive to eye opening and eye closing. Hyperventilation did not show any EEG change.  Physiologic photic driving was seen during photic stimulation.    IMPRESSION: This study is within normal limits. No seizures or epileptiform discharges were seen throughout the recording.  Britanee Vanblarcom Barbra Sarks

## 2020-10-15 NOTE — Plan of Care (Signed)

## 2020-10-15 NOTE — Consult Note (Addendum)
Neurology Consultation Reason for Consult: Seizure Requesting Physician: Dr. Ileene Musa  CC: Involuntary chewing movements   History is obtained from: Patient and chart review   HPI: Jeffery Reed is a 75 y.o. male with a past medical history significant for right frontal meningioma s/p resection (12/2019), hypertension, hyperlipidemia, prediabetes, obstructive sleep apnea on CPAP, anxiety, gout.  Regarding his history of meningioma, he presented to the hospital on 12/28/2019 for an unwitnessed fall with subsequent confusion and head CT demonstrated a large mass.  At the time, review of systems is notable for 1 week of daily headaches, and a slow gradual cognitive change since December 2021.  He had also been struggling with left thigh pain which had not been improving with physical therapy.  MRI showed "7.0 x 2.9 x 3.1 cm extra axial mass overlying the right frontal convexity, most consistent with a meningioma," with significant vasogenic edema and an 8 mm midline shift for which he was on Decadron (which caused significant difficulty with blood glucose control). He underwent right frontal craniotomy and gross total excision of meningioma on 12/29/19 by Dr. Ellene Route, neurosurgery and did well postoperatively, with his Decadron subsequently gradually weaned.  He was additionally on Keppra 500 mg twice daily for 6 months for seizure prevention.  He notes he overall tolerated this medication well although his wife did feel like he had a personality change which he does think improved a bit with a supplementary vitamin. He initially planned for follow-up with radiation oncology (Dr. Mickeal Skinner) but this was canceled given his repeat MRI did not show any evidence for incomplete resection.  Today, he was with his wife at dinner when he had 1 to 2 minutes of loss of control of his jaw.  It was continuing to rhythmically chew and his wife was concerned he was choking.  He tried to talk to her and tell her he was not  choking, but he was unable to control his mouth movements.  This resolved on its own and there was no postevent weakness of the jaw or other ill effects.  On review of systems he notes that he never gets uninterrupted sleep given he wakes every 2-3 hours to urinate secondary to an enlarged prostate.  ROS: All other review of systems was negative except as noted in the HPI.   Past Medical History:  Diagnosis Date  . Allergy   . Anxiety   . Cataract    beginning stage 01/22/2019  . Chronic kidney disease    enlarge bladder  . Diabetes mellitus without complication ([place])   . GERD (gastroesophageal reflux disease)   . Gout   . Hyperlipidemia   . Hypertension   . OSA (obstructive sleep apnea)   . Pre-diabetes   . Sleep apnea    c pap   Past Surgical History:  Procedure Laterality Date  . COLONOSCOPY    . CRANIOTOMY Right 12/29/2019   Procedure: Craniotomy for Tumor;  Surgeon: Kristeen Miss, MD;  Location: [facility];  Service: Neurosurgery;  Laterality: Right;  Craniotomy for Tumor Excision  . FOOT SURGERY  10/2009  . MYRINGOTOMY Left 02-2005   Dr.Newman  . TONSILLECTOMY  1953   Current Outpatient Medications  Medication Instructions  . acetaminophen (TYLENOL) 650 mg, Oral, Every 8 hours PRN  . citalopram (CELEXA) 20 MG tablet TAKE 1 TABLET BY MOUTH  DAILY FOR MOOD  . fenofibrate 160 MG tablet Take      1 tablet      Daily  for Triglycerides (Blood Fats)  . fexofenadine (ALLEGRA) 180 mg, Oral, Daily PRN  . fluticasone (FLONASE) 50 MCG/ACT nasal spray Use 2 sprays each nostril  /daily  . glipiZIDE (GLUCOTROL) 5 MG tablet TAKE 1 TO 2 TABLETS BY  MOUTH 3 TIMES A DAY WITH  MEALS FOR DIABETES  . glucose blood (FREESTYLE LITE) test strip Check blood sugar 1 time daily-[id number]  . metFORMIN (GLUCOPHAGE-XR) 1,000 mg, Oral, 2 times daily with meals  . mupirocin ointment (BACTROBAN) 2 % APPLY TWICE A DAY TO SKIN INFECTION.  Marland Kitchen omeprazole (PRILOSEC) 40 MG capsule Take     1 capsule     Daily       to Prevent Indigestion & Heartburn  . polyethylene glycol (MIRALAX / GLYCOLAX) 17 g, Oral, Daily PRN  . rosuvastatin (CRESTOR) 10 MG tablet TAKE 1 TABLET BY MOUTH  DAILY FOR CHOLESTEROL  . valsartan-hydrochlorothiazide (DIOVAN-HCT) 320-25 MG tablet TAKE 1 TABLET BY MOUTH  DAILY FOR BLOOD PRESSURE  . Vitamin D3 10,000 Units, Oral, Daily at bedtime     Family History  Problem Relation Age of Onset  . Hypertension Mother   . GER disease Mother   . Hyperlipidemia Father   . Hypertension Father   . Parkinson's disease Father   . Hypertension Brother   . Diabetes Maternal Grandmother   . Stroke Paternal Grandmother   . Diabetes Paternal Grandmother   . Stroke Paternal Grandfather   . Colon cancer Neg Hx   . Colon polyps Neg Hx   . Esophageal cancer Neg Hx   . Stomach cancer Neg Hx   . Rectal cancer Neg Hx    Social History:  reports that he has never smoked. He has never used smokeless tobacco. He reports that he does not drink alcohol and does not use drugs.  Exam: Current vital signs: BP 131/65 (BP Location: Right Arm)   Pulse 67   Temp 98.1 F (36.7 C) (Oral)   Resp 18   Ht 5\' 10"  (1.778 m)   Wt 97.2 kg   SpO2 92%   BMI 30.75 kg/m  Vital signs in last 24 hours: Temp:  [98.1 F (36.7 C)-98.6 F (37 C)] 98.1 F (36.7 C) (02/24 2359) Pulse Rate:  [64-78] 67 (02/24 2359) Resp:  [10-23] 18 (02/24 2359) BP: (124-153)/(65-82) 131/65 (02/24 2359) SpO2:  [92 %-98 %] 92 % (02/24 2359) Weight:  [97.2 kg-97.3 kg] 97.2 kg (02/24 2359)   Physical Exam  Constitutional: Appears well-developed and well-nourished.  Psych: Affect appropriate to situation, calm and cooperative, very pleasant Eyes: No scleral injection HENT: No oropharyngeal obstruction.  MSK: no joint deformities.  Cardiovascular: Perfusing extremities well Respiratory: Effort normal, non-labored breathing GI: Soft.  No distension. There is no tenderness.  Skin: Warm dry and intact visible  skin  Neuro: Mental Status: Patient is awake, alert, oriented to person, place, month, year, and situation. Patient is able to give a clear and coherent history. No signs of aphasia or neglect Cranial Nerves: II: Visual Fields are full. Pupils are equal, round, and reactive to light.   III,IV, VI: EOMI without ptosis or diploplia.  V: Facial sensation is symmetric to temperature VII: Facial movement is symmetric.  VIII: hearing is intact to voice X: Uvula elevates symmetrically XI: Shoulder shrug is symmetric. XII: tongue is midline without atrophy or fasciculations.  Motor: Tone is normal. Bulk is normal. 5/5 strength was present in all four extremities other than mild weakness of the left hip flexor (4+/5) Sensory: Sensation is  symmetric to light touch and temperature in the arms and legs. Deep Tendon Reflexes: 2+ and symmetric in the biceps and patellae.  Cerebellar: FNF and HKS are intact bilaterally   I have reviewed labs in epic and the results pertinent to this consultation are: Creatinine 2.04 at [facility], 1.84 on repeat today  I have personally reviewed the images obtained: Head CT without acute intracranial process MRI brain without evidence of abnormal contrast enhancement or tumor recurrence   Impression: This is a 75 year old man with a past medical history of meningioma s/p resection.  I do suspect that this event represents a focal seizure with retained awareness though I am a bit surprised it was a focal seizure in the jaw as I would more likely expect left leg symptoms based on the location of his lesion.  Nevertheless the description the patient provides is fairly convincing to me.  Given his history of meningioma it was important to rule out recurrence with MRI brain, and the study is reassuring.  Will complete work-up with routine EEG.  Given he tolerated the Keppra well overall, will start 500 mg twice daily.   Recommendations: -MRI brain with  without contrast to confirm no meningioma recurrence (completed as above) -Routine EEG (ordered) -Resume Keppra 500 mg twice daily, with B6 50 mg daily for mood regulation (ordered) -Outpatient follow-up with [facility], (ordered) -Neurology will follow up the EEG and otherwise be available on an as-needed basis going forward  Please include in discharge instructions "Standard seizure precautions: Per [place] statutes, patients with seizures are not allowed to drive until  they have been seizure-free for six months. Use caution when using heavy equipment or power tools. Avoid working on ladders or at heights. Take showers instead of baths. Ensure the water temperature is not too high on the home water heater. Do not go swimming alone. When caring for infants or small children, sit down when holding, feeding, or changing them to minimize risk of injury to the child in the event you have a seizure.  To reduce risk of seizures, maintain good sleep hygiene avoid alcohol and illicit drug use, take all anti-seizure medications as prescribed. "  These instructions were verbally reviewed with the patient by myself at bedside  Lesleigh Noe MD-PhD [facility] [phone] Available 7 PM to 7 AM, outside of these hours please call Neurologist on call as listed on Amion.

## 2020-10-15 NOTE — Progress Notes (Signed)
Patient ID: Jeffery Reed, male   DOB: 10/05/1945, 75 y.o.   MRN: [id number] Patient admitted early this morning for seizure.  I have reviewed patient's medical records including this morning's H&P, current vitals, labs, medications myself.  Patient seen and examined at bedside and plan of care discussed with him.  Neurology following.  Awaiting EEG.  PT eval.  MRI of brain was negative for any acute abnormality.  Fall and seizure precautions.  Repeat a.m. labs.  Replace potassium.

## 2020-10-15 NOTE — Progress Notes (Signed)
EEG complete - results pending 

## 2020-10-15 NOTE — Evaluation (Signed)
Physical Therapy Evaluation Patient Details Name: Jeffery Reed MRN: [id number] DOB: 01-13-1946 Today's Date: 10/15/2020   History of Present Illness  75 y.o. male with a past medical history significant for right frontal meningioma s/p resection (12/2019) with transient involuntary chewing movements  Clinical Impression  Pt fully participated in session. Pt performed ambulation and stair negotiation mod I without use of AD. Pt states wife is at home with him throughout the day and he feels like he is at his baseline. No further acute PT needs ntoed    Follow Up Recommendations No PT follow up    Equipment Recommendations  None recommended by PT    Recommendations for Other Services       Precautions / Restrictions Precautions Precautions: Fall;Other (comment) Precaution Comments: seizure Restrictions Weight Bearing Restrictions: No      Mobility  Bed Mobility Overal bed mobility: Independent                  Transfers Overall transfer level: Modified independent                  Ambulation/Gait Ambulation/Gait assistance: Modified independent (Device/Increase time) Gait Distance (Feet): 250 Feet         General Gait Details: initially slight deviation to the R but able to regain balance without assist. Pt states when eh first gets up he needs to stand longer prior to ambulating  Stairs Stairs: Yes Stairs assistance: Modified independent (Device/Increase time) Stair Management: One rail Right Number of Stairs: 3    Wheelchair Mobility    Modified Rankin (Stroke Patients Only)       Balance Overall balance assessment: Mild deficits observed, not formally tested                                           Pertinent Vitals/Pain Pain Assessment: No/denies pain    Home Living Family/patient expects to be discharged to:: Private residence Living Arrangements: Spouse/significant other Available Help at Discharge:  Family Type of Home: House Home Access: Stairs to enter Entrance Stairs-Rails: Right Entrance Stairs-Number of Steps: 3 Home Layout: One level Home Equipment: Grab bars - tub/shower;Walker - 2 wheels;Cane - single point      Prior Function Level of Independence: Independent               Hand Dominance   Dominant Hand: Right    Extremity/Trunk Assessment   Upper Extremity Assessment Upper Extremity Assessment: Overall WFL for tasks assessed    Lower Extremity Assessment Lower Extremity Assessment: Overall WFL for tasks assessed       Communication   Communication: Expressive difficulties  Cognition Arousal/Alertness: Awake/alert Behavior During Therapy: WFL for tasks assessed/performed Overall Cognitive Status: Within Functional Limits for tasks assessed                                        General Comments General comments (skin integrity, edema, etc.): performed lateral and vertical head movement during ambulation wtih no LOB noted; education to stand and gain balance prior to ambulating    Exercises     Assessment/Plan    PT Assessment Patent does not need any further PT services  PT Problem List         PT Treatment Interventions  PT Goals (Current goals can be found in the Care Plan section)  Acute Rehab PT Goals Patient Stated Goal: I want to go home today PT Goal Formulation: With patient Time For Goal Achievement: 10/15/20 Potential to Achieve Goals: Good    Frequency     Barriers to discharge        Co-evaluation               AM-PAC PT "6 Clicks" Mobility  Outcome Measure Help needed turning from your back to your side while in a flat bed without using bedrails?: None Help needed moving from lying on your back to sitting on the side of a flat bed without using bedrails?: None Help needed moving to and from a bed to a chair (including a wheelchair)?: None Help needed standing up from a chair using your  arms (e.g., wheelchair or bedside chair)?: None Help needed to walk in hospital room?: None Help needed climbing 3-5 steps with a railing? : None 6 Click Score: 24    End of Session Equipment Utilized During Treatment: Gait belt Activity Tolerance: Patient tolerated treatment well Patient left: in chair;with call bell/phone within reach Nurse Communication: Mobility status PT Visit Diagnosis: Unsteadiness on feet (R26.81)    Time: 3546-5681 PT Time Calculation (min) (ACUTE ONLY): 18 min   Charges:   PT Evaluation $PT Eval Low Complexity: 1 Low          Lyanne Co, DPT Acute Rehabilitation Services [phone]  [place] 10/15/2020, 12:14 PM

## 2020-10-15 NOTE — H&P (Addendum)
History and Physical    Jeffery Reed MHD:[id number] DOB: [birth year] DOA: 10/14/2020  PCP: Unk Pinto, MD  Patient coming from: [place] ED  I have personally briefly reviewed patient's old medical records in [place]  Chief Complaint: Involuntary shaking of the mouth  HPI: Jeffery Reed is a 75 y.o. male with medical history significant for right cerebral meningioma s/p craniotomy, hypertension, CKD stage III, OSA not on CPAP, type 2 diabetes and hyperlipidemia who presents with concerns of seizure-like activity.  Patient was having dinner tonight around 5 PM with his wife when he suddenly had involuntary shaking of his mouth.  States it lasted for about a minute and resolved.  He was conscious throughout the episode and was aware.  Denies any residual slurred speech. Denies any headache or blurry vision.  No extremity weakness, numbness or tingling.  He was otherwise in his normal state of health. Patient had craniotomy for right meningioma back in 12/2019 and has not had an issue since.  He denies any recent new medication and states he only missed his anxiety pill this morning.  Unsure of the name of the medication.   ED Course: He was afebrile with otherwise normal vitals.CBC showed no leukocytosis or anemia.  BMP shows AKI with creatinine of 2.04 from a prior normal of 1.24.  He was given 1 L of LR bolus in the ED.  Neurology at [facility] was consulted and recommended MRI with EEG in the morning.  No AEDs recommended for now unless patient has repeat seizures.  Review of Systems: Constitutional: No Weight Change, No Fever ENT/Mouth: No sore throat, No Rhinorrhea Eyes: No Eye Pain, No Vision Changes Cardiovascular: No Chest Pain, no SOB, No Palpitations Respiratory: No Cough, No Sputum  Gastrointestinal: No Nausea, No Vomiting, No Diarrhea, No Constipation, No Pain Genitourinary: no Urinary Incontinence Musculoskeletal: No Arthralgias, No  Myalgias Skin: No Skin Lesions, No Pruritus, Neuro: no Weakness, No Numbness,  No Loss of Consciousness, No Syncope Psych: No Anxiety/Panic, No Depression, no decrease appetite Heme/Lymph: No Bruising, No Bleeding  Past Medical History:  Diagnosis Date  . Allergy   . Anxiety   . Cataract    beginning stage 01/22/2019  . Chronic kidney disease    enlarge bladder  . Diabetes mellitus without complication ([place])   . GERD (gastroesophageal reflux disease)   . Gout   . Hyperlipidemia   . Hypertension   . OSA (obstructive sleep apnea)   . Pre-diabetes   . Sleep apnea    c pap    Past Surgical History:  Procedure Laterality Date  . COLONOSCOPY    . CRANIOTOMY Right 12/29/2019   Procedure: Craniotomy for Tumor;  Surgeon: Kristeen Miss, MD;  Location: [facility];  Service: Neurosurgery;  Laterality: Right;  Craniotomy for Tumor Excision  . FOOT SURGERY  10/2009  . MYRINGOTOMY Left 02-2005   Dr.Newman  . TONSILLECTOMY  1953     reports that he has never smoked. He has never used smokeless tobacco. He reports that he does not drink alcohol and does not use drugs. Social History  Allergies  Allergen Reactions  . Cardura [Doxazosin Mesylate] Other (See Comments)    Nasal congestion  . Codeine Other (See Comments)    The patient passed out  . Hytrin [Terazosin] Other (See Comments)    Reaction not recalled    Family History  Problem Relation Age of Onset  . Hypertension Mother   . GER disease Mother   .  Hyperlipidemia Father   . Hypertension Father   . Parkinson's disease Father   . Hypertension Brother   . Diabetes Maternal Grandmother   . Stroke Paternal Grandmother   . Diabetes Paternal Grandmother   . Stroke Paternal Grandfather   . Colon cancer Neg Hx   . Colon polyps Neg Hx   . Esophageal cancer Neg Hx   . Stomach cancer Neg Hx   . Rectal cancer Neg Hx      Prior to Admission medications   Medication Sig Start Date End Date Taking? Authorizing Provider   acetaminophen (TYLENOL) 650 MG CR tablet Take 650 mg by mouth every 8 (eight) hours as needed for pain (or headaches).    [provider]  Cholecalciferol (VITAMIN D3) 10000 units capsule Take 10,000 Units by mouth at bedtime.     [provider]  citalopram (CELEXA) 20 MG tablet TAKE 1 TABLET BY MOUTH  DAILY FOR MOOD 05/18/20   Garnet Sierras, NP  fenofibrate 160 MG tablet Take      1 tablet      Daily       for Triglycerides (Blood Fats) 05/20/20   Liane Comber, NP  fexofenadine (ALLEGRA) 180 MG tablet Take 180 mg by mouth daily as needed for allergies or rhinitis.    [provider]  fluticasone (FLONASE) 50 MCG/ACT nasal spray Use 2 sprays each nostril  /daily Patient taking differently: Place 2 sprays into both nostrils daily as needed for allergies or rhinitis. 04/18/18   Unk Pinto, MD  glipiZIDE (GLUCOTROL) 5 MG tablet TAKE 1 TO 2 TABLETS BY  MOUTH 3 TIMES A DAY WITH  MEALS FOR DIABETES 07/27/20   Garnet Sierras, NP  glucose blood (FREESTYLE LITE) test strip Check blood sugar 1 time daily-[id number] 01/30/20   Vladimir Crofts, PA-C  metFORMIN (GLUCOPHAGE-XR) 500 MG 24 hr tablet Take 2 tablets (1,000 mg total) by mouth 2 (two) times daily with a meal. 02/17/20   Philemon Kingdom, MD  mupirocin ointment (BACTROBAN) 2 % APPLY TWICE A DAY TO SKIN INFECTION. 09/23/20   Unk Pinto, MD  omeprazole (PRILOSEC) 40 MG capsule Take     1 capsule     Daily      to Prevent Indigestion & Heartburn 05/20/20   Liane Comber, NP  polyethylene glycol (MIRALAX / GLYCOLAX) 17 g packet Take 17 g by mouth daily as needed for mild constipation. 01/02/20   Kayleen Memos, DO  rosuvastatin (CRESTOR) 10 MG tablet TAKE 1 TABLET BY MOUTH  DAILY FOR CHOLESTEROL 10/02/20   Liane Comber, NP  valsartan-hydrochlorothiazide (DIOVAN-HCT) 320-25 MG tablet TAKE 1 TABLET BY MOUTH  DAILY FOR BLOOD PRESSURE 10/02/20   Liane Comber, NP    Physical Exam: Vitals:   10/14/20 2107 10/14/20  2200 10/14/20 2300 10/14/20 2359  BP: 125/74 124/73 136/82 131/65  Pulse: 78 73 64 67  Resp: 10 20 (!) 23 18  Temp:   98.6 F (37 C) 98.1 F (36.7 C)  TempSrc:    Oral  SpO2: 95% 98% 96% 92%  Weight:    97.2 kg  Height:    5\' 10"  (1.778 m)    Constitutional: NAD, calm, comfortable, elderly male laying flat in bed Vitals:   10/14/20 2107 10/14/20 2200 10/14/20 2300 10/14/20 2359  BP: 125/74 124/73 136/82 131/65  Pulse: 78 73 64 67  Resp: 10 20 (!) 23 18  Temp:   98.6 F (37 C) 98.1 F (36.7 C)  TempSrc:  Oral  SpO2: 95% 98% 96% 92%  Weight:    97.2 kg  Height:    5\' 10"  (1.778 m)   Eyes: PERRL, lids and conjunctivae normal ENMT: Mucous membranes are moist.  Neck: normal, supple Respiratory: clear to auscultation bilaterally, no wheezing, no crackles. Normal respiratory effort. No accessory muscle use.  Cardiovascular: Regular rate and rhythm, no murmurs / rubs / gallops. No extremity edema. 2+ pedal pulses.  Abdomen: no tenderness, no masses palpated. . Bowel sounds positive.  Musculoskeletal: no clubbing / cyanosis. No joint deformity upper and lower extremities. Good ROM, no contractures. Normal muscle tone.  Skin: no rashes, lesions, ulcers. No induration Neurologic: CN 2-12 grossly intact. Sensation intact,  Strength 5/5 in all 4.  Intact heel-to-shin.  Intact finger to nose.  No noted dysarthria.   Psychiatric: Normal judgment and insight. Alert and oriented x 3. Normal mood.     Labs on Admission: I have personally reviewed following labs and imaging studies  CBC: Recent Labs  Lab 10/14/20 2053  WBC 8.9  NEUTROABS 5.4  HGB 14.1  HCT 41.1  MCV 85.6  PLT 161   Basic Metabolic Panel: Recent Labs  Lab 10/14/20 2053  NA 138  K 3.8  CL 104  CO2 23  GLUCOSE 99  BUN 36*  CREATININE 2.04*  CALCIUM 9.8   GFR: Estimated Creatinine Clearance: 37.7 mL/min (A) (by C-G formula based on SCr of 2.04 mg/dL (H)). Liver Function Tests: No results for input(s):  AST, ALT, ALKPHOS, BILITOT, PROT, ALBUMIN in the last 168 hours. No results for input(s): LIPASE, AMYLASE in the last 168 hours. No results for input(s): AMMONIA in the last 168 hours. Coagulation Profile: No results for input(s): INR, PROTIME in the last 168 hours. Cardiac Enzymes: No results for input(s): CKTOTAL, CKMB, CKMBINDEX, TROPONINI in the last 168 hours. BNP (last 3 results) No results for input(s): PROBNP in the last 8760 hours. HbA1C: No results for input(s): HGBA1C in the last 72 hours. CBG: Recent Labs  Lab 10/14/20 2036  GLUCAP 93   Lipid Profile: No results for input(s): CHOL, HDL, LDLCALC, TRIG, CHOLHDL, LDLDIRECT in the last 72 hours. Thyroid Function Tests: No results for input(s): TSH, T4TOTAL, FREET4, T3FREE, THYROIDAB in the last 72 hours. Anemia Panel: No results for input(s): VITAMINB12, FOLATE, FERRITIN, TIBC, IRON, RETICCTPCT in the last 72 hours. Urine analysis:    Component Value Date/Time   COLORURINE YELLOW 03/02/2020 1451   APPEARANCEUR CLEAR 03/02/2020 1451   LABSPEC 1.020 03/02/2020 1451   PHURINE < OR = 5.0 03/02/2020 1451   GLUCOSEU NEGATIVE 03/02/2020 1451   HGBUR NEGATIVE 03/02/2020 1451   BILIRUBINUR NEGATIVE 12/28/2019 2142   KETONESUR NEGATIVE 03/02/2020 1451   PROTEINUR NEGATIVE 03/02/2020 1451   NITRITE NEGATIVE 03/02/2020 1451   LEUKOCYTESUR NEGATIVE 03/02/2020 1451    Radiological Exams on Admission: CT Head Wo Contrast  Result Date: 10/14/2020 CLINICAL DATA:  Seizure EXAM: CT HEAD WITHOUT CONTRAST TECHNIQUE: Contiguous axial images were obtained from the base of the skull through the vertex without intravenous contrast. COMPARISON:  MRI 04/29/2020, CT 12/31/2019 FINDINGS: Brain: No acute territorial infarction or intracranial hemorrhage is visualized. Patient is status post resection of right frontal extra-axial mass. There is encephalomalacia within the right frontal lobe. There is mild dural thickening on the right with largely  resolved extra-axial collection under the craniotomy. There is mild atrophy. There is mild hypodensity within the white matter likely due to chronic small vessel ischemic change. The ventricles are nonenlarged. Vascular:  No hyperdense vessels.  No unexpected calcification. Skull: Right frontal parietal craniotomy.  No fracture Sinuses/Orbits: Mild thickening in the right frontal sinus. Other: None IMPRESSION: 1. No CT evidence for acute intracranial abnormality. 2. Status post resection of right frontal extra-axial mass with encephalomalacia in the right frontal lobe. Previously noted extra-axial collection deep to the craniotomy has resolved. 3. Atrophy and mild chronic small vessel ischemic changes of the white matter. Electronically Signed   By: Donavan Foil M.D.   On: 10/14/2020 21:21      Assessment/PlaN  Seizure like activity with history of craniotomy for removal of right cerebral meningioma Patient had about a minute of involuntary mouth movement concerning for partial seizure.  No witnessed seizure-like activity while inpatient. EEG in the AM Obtain MRI brain  no AED unless repeat seizure  frequent neuro checks  Placed on seizure precaution Neurology is aware and will be following.  Appreciate recommendations.  AKI on CKD stage 3a Possibly prerenal.  Patient only received 1 L of LR.  We will continue IV 75 cc/h normal saline fluid. Follow with repeat BMP in the morning.  Avoid nephrotoxic agent.  HTN Hold valsartan-HCTZ due to AKI  OSA not on CPAP  Type 2 diabetes Controlled.  Hemoglobin A1c of 5.7 on 08/2018 Monitor with daily labs  Hyperlipidemia Continue statin  Anxiety Continue Celexa  DVT prophylaxis:.Lovenox Code Status: Full Family Communication: Plan discussed with patient at bedside  disposition Plan: Home with observation Consults called: Neuro Admission status: Observation  Level of care: Med-Surg  Status is: Observation  The patient remains OBS  appropriate and will d/c before 2 midnights.  Dispo: The patient is from: Home              Anticipated d/c is to: Home              Patient currently is not medically stable to d/c.   Difficult to place patient No         Orene Desanctis DO [facility]   If 7PM-7AM, please contact night-coverage [email or web address]   10/15/2020, 1:03 AM

## 2020-10-15 NOTE — Plan of Care (Signed)

## 2020-10-16 DIAGNOSIS — N1832 Chronic kidney disease, stage 3b: Secondary | ICD-10-CM | POA: Diagnosis not present

## 2020-10-16 DIAGNOSIS — N179 Acute kidney failure, unspecified: Secondary | ICD-10-CM

## 2020-10-16 DIAGNOSIS — I10 Essential (primary) hypertension: Secondary | ICD-10-CM

## 2020-10-16 LAB — CBC WITH DIFFERENTIAL/PLATELET
Abs Immature Granulocytes: 0.03 10*3/uL (ref 0.00–0.07)
Basophils Absolute: 0.1 10*3/uL (ref 0.0–0.1)
Basophils Relative: 1 %
Eosinophils Absolute: 0.3 10*3/uL (ref 0.0–0.5)
Eosinophils Relative: 4 %
HCT: 38.9 % — ABNORMAL LOW (ref 39.0–52.0)
Hemoglobin: 12.9 g/dL — ABNORMAL LOW (ref 13.0–17.0)
Immature Granulocytes: 0 %
Lymphocytes Relative: 27 %
Lymphs Abs: 2 10*3/uL (ref 0.7–4.0)
MCH: 28.9 pg (ref 26.0–34.0)
MCHC: 33.2 g/dL (ref 30.0–36.0)
MCV: 87 fL (ref 80.0–100.0)
Monocytes Absolute: 0.8 10*3/uL (ref 0.1–1.0)
Monocytes Relative: 11 %
Neutro Abs: 4.2 10*3/uL (ref 1.7–7.7)
Neutrophils Relative %: 57 %
Platelets: 151 10*3/uL (ref 150–400)
RBC: 4.47 MIL/uL (ref 4.22–5.81)
RDW: 13.9 % (ref 11.5–15.5)
WBC: 7.3 10*3/uL (ref 4.0–10.5)
nRBC: 0 % (ref 0.0–0.2)

## 2020-10-16 LAB — COMPREHENSIVE METABOLIC PANEL
ALT: 16 U/L (ref 0–44)
AST: 21 U/L (ref 15–41)
Albumin: 3.7 g/dL (ref 3.5–5.0)
Alkaline Phosphatase: 31 U/L — ABNORMAL LOW (ref 38–126)
Anion gap: 7 (ref 5–15)
BUN: 23 mg/dL (ref 8–23)
CO2: 25 mmol/L (ref 22–32)
Calcium: 9.1 mg/dL (ref 8.9–10.3)
Chloride: 109 mmol/L (ref 98–111)
Creatinine, Ser: 1.78 mg/dL — ABNORMAL HIGH (ref 0.61–1.24)
GFR, Estimated: 40 mL/min — ABNORMAL LOW (ref >60)
Glucose, Bld: 142 mg/dL — ABNORMAL HIGH (ref 70–99)
Potassium: 4 mmol/L (ref 3.5–5.1)
Sodium: 141 mmol/L (ref 135–145)
Total Bilirubin: 1 mg/dL (ref 0.3–1.2)
Total Protein: 6.2 g/dL — ABNORMAL LOW (ref 6.5–8.1)

## 2020-10-16 LAB — MAGNESIUM: Magnesium: 1.8 mg/dL (ref 1.7–2.4)

## 2020-10-16 LAB — GLUCOSE, CAPILLARY: Glucose-Capillary: 143 mg/dL — ABNORMAL HIGH (ref 70–99)

## 2020-10-16 MED ORDER — FLUTICASONE PROPIONATE 50 MCG/ACT NA SUSP: 2.0000 | Freq: Every day | NASAL | Status: DC | PRN

## 2020-10-16 MED ORDER — PYRIDOXINE HCL 50 MG PO TABS: 50.0000 mg | ORAL_TABLET | Freq: Every day | ORAL | 0 refills | Status: DC

## 2020-10-16 MED ORDER — LEVETIRACETAM 500 MG PO TABS: 500.0000 mg | ORAL_TABLET | Freq: Two times a day (BID) | ORAL | 0 refills | Status: DC

## 2020-10-16 NOTE — Discharge Summary (Signed)
Physician Discharge Summary  Jeffery Reed TFT:[id number] DOB: 1946/08/19 DOA: 10/14/2020  PCP: Unk Pinto, MD  Admit date: 10/14/2020 Discharge date: 10/16/2020  Admitted From: Home Disposition: Home  Recommendations for Outpatient Follow-up:  1. Follow up with PCP in 1 week with repeat CBC/BMP 2. Outpatient follow-up with neurology 3. No driving for at least 6 months and only after clearance by PCP/neurology 4. Follow up in ED if symptoms worsen or new appear   Home Health: No Equipment/Devices: None  Discharge Condition: Stable CODE STATUS: Full Diet recommendation: Heart healthy/carb modified  Brief/Interim Summary: 75 y.o. male with medical history significant for right cerebral meningioma s/p craniotomy, hypertension, CKD stage III, OSA not on CPAP, type 2 diabetes and hyperlipidemia presented with seizure-like activity.  Under sedation, he had AKI with creatinine of 2.04.  CT of the head was negative for acute abnormality.  Neurology recommended MRI and EEG.  MRI of the brain was negative for acute abnormality as well.  EEG was negative for active seizure.  He was started on Keppra.  Neurology has cleared the patient for discharge on oral Keppra.  Kidney function has improved with IV fluids.  He will be discharged home today.  Outpatient follow-up with neurology.  Discharge Diagnoses:   Seizure-like activity in a patient with history of craniotomy for removal of right cerebral meningioma -Presented with possible seizure.  CT brain/MRI of the brain were negative for acute intracranial abnormality.  EEG was negative for active seizures.  Neurology has started the patient on Keppra and pyridoxine.  No seizures since admission.  Discharge patient home on Keppra and pyridoxine -Patient has tolerated PT -Outpatient follow-up with neurology. "Standard seizure precautions: Per [place] statutes, patients with seizures are not allowed to drive until they have been  seizure-free for six months. Use caution when using heavy equipment or power tools. Avoid working on ladders or at heights. Take showers instead of baths. Ensure the water temperature is not too high on the home water heater. Do not go swimming alone. When caring for infants or small children, sit down when holding, feeding, or changing them to minimize risk of injury to the child in the event you have a seizure.  To reduce risk of seizures, maintain good sleep hygiene avoid alcohol and illicit drug use, take all anti-seizure medications as prescribed.   Acute kidney injury on chronic kidney disease stage IIIa -Possibly prerenal.  Presented with creatinine of 2.04 and treated with IV fluids.  Creatinine has improved to 1.78.  Outpatient follow-up with PCP.  Hypertension -Blood pressure stable.  Keep valsartan and hydrochlorothiazide on hold till reevaluation by PCP  Diabetes mellitus type 2 -Hold Metformin.  Continue glipizide.  Outpatient follow-up with PCP  Hyperlipidemia -continue statin  Anxiety -continue Celexa  Discharge Instructions  Discharge Instructions    Ambulatory referral to Neurology   Complete by: As directed    An appointment is requested in approximately: 4-8 weeks for seizure follow-up   Diet - low sodium heart healthy   Complete by: As directed    Diet Carb Modified   Complete by: As directed    Increase activity slowly   Complete by: As directed      Allergies as of 10/16/2020      Reactions   Cardura [doxazosin Mesylate] Other (See Comments)   Nasal congestion   Codeine Other (See Comments)   The patient passed out   Hytrin [terazosin] Other (See Comments)   Reaction not recalled  Medication List    STOP taking these medications   metFORMIN 500 MG 24 hr tablet Commonly known as: GLUCOPHAGE-XR   valsartan-hydrochlorothiazide 320-25 MG tablet Commonly known as: DIOVAN-HCT     TAKE these medications   acetaminophen 650 MG CR tablet Commonly  known as: TYLENOL Take 650 mg by mouth every 8 (eight) hours as needed for pain (or headaches).   citalopram 20 MG tablet Commonly known as: CELEXA TAKE 1 TABLET BY MOUTH  DAILY FOR MOOD What changed:   how much to take  how to take this  when to take this  additional instructions   fenofibrate 160 MG tablet Take      1 tablet      Daily       for Triglycerides (Blood Fats) What changed:   how much to take  how to take this  when to take this  additional instructions   fexofenadine 180 MG tablet Commonly known as: ALLEGRA Take 180 mg by mouth daily as needed for allergies or rhinitis.   fluticasone 50 MCG/ACT nasal spray Commonly known as: FLONASE Place 2 sprays into both nostrils daily as needed for allergies or rhinitis.   FREESTYLE LITE test strip Generic drug: glucose blood Check blood sugar 1 time daily-DX-E11.22   glipiZIDE 5 MG tablet Commonly known as: GLUCOTROL TAKE 1 TO 2 TABLETS BY  MOUTH 3 TIMES A DAY WITH  MEALS FOR DIABETES What changed:   how much to take  how to take this  when to take this  additional instructions   levETIRAcetam 500 MG tablet Commonly known as: KEPPRA Take 1 tablet (500 mg total) by mouth 2 (two) times daily.   mupirocin ointment 2 % Commonly known as: BACTROBAN APPLY TWICE A DAY TO SKIN INFECTION. What changed:   how much to take  how to take this  when to take this  reasons to take this  additional instructions   omeprazole 40 MG capsule Commonly known as: PRILOSEC Take     1 capsule     Daily      to Prevent Indigestion & Heartburn What changed:   how much to take  how to take this  when to take this  additional instructions   polyethylene glycol 17 g packet Commonly known as: MIRALAX / GLYCOLAX Take 17 g by mouth daily as needed for mild constipation.   pyridOXINE 50 MG tablet Commonly known as: B-6 Take 1 tablet (50 mg total) by mouth daily.   rosuvastatin 10 MG tablet Commonly known  as: CRESTOR TAKE 1 TABLET BY MOUTH  DAILY FOR CHOLESTEROL What changed:   how much to take  how to take this  when to take this   Vitamin D3 250 MCG (10000 UT) capsule Take 10,000 Units by mouth at bedtime.       Follow-up Information    Unk Pinto, MD. Schedule an appointment as soon as possible for a visit in 1 week(s).   Specialty: Internal Medicine Why: With repeat CBC/BMP Contact information: [address] [place] [address] [phone]              Allergies  Allergen Reactions  . Cardura [Doxazosin Mesylate] Other (See Comments)    Nasal congestion  . Codeine Other (See Comments)    The patient passed out  . Hytrin [Terazosin] Other (See Comments)    Reaction not recalled    Consultations:  Neurology   Procedures/Studies: CT Head Wo Contrast  Result Date: 10/14/2020 CLINICAL  DATA:  Seizure EXAM: CT HEAD WITHOUT CONTRAST TECHNIQUE: Contiguous axial images were obtained from the base of the skull through the vertex without intravenous contrast. COMPARISON:  MRI 04/29/2020, CT 12/31/2019 FINDINGS: Brain: No acute territorial infarction or intracranial hemorrhage is visualized. Patient is status post resection of right frontal extra-axial mass. There is encephalomalacia within the right frontal lobe. There is mild dural thickening on the right with largely resolved extra-axial collection under the craniotomy. There is mild atrophy. There is mild hypodensity within the white matter likely due to chronic small vessel ischemic change. The ventricles are nonenlarged. Vascular: No hyperdense vessels.  No unexpected calcification. Skull: Right frontal parietal craniotomy.  No fracture Sinuses/Orbits: Mild thickening in the right frontal sinus. Other: None IMPRESSION: 1. No CT evidence for acute intracranial abnormality. 2. Status post resection of right frontal extra-axial mass with encephalomalacia in the right frontal lobe. Previously noted  extra-axial collection deep to the craniotomy has resolved. 3. Atrophy and mild chronic small vessel ischemic changes of the white matter. Electronically Signed   By: Donavan Foil M.D.   On: 10/14/2020 21:21   MR BRAIN W WO CONTRAST  Result Date: 10/15/2020 CLINICAL DATA:  Acute presentation with shaking. History of meningioma resection. EXAM: MRI HEAD WITHOUT AND WITH CONTRAST TECHNIQUE: Multiplanar, multiecho pulse sequences of the brain and surrounding structures were obtained without and with intravenous contrast. CONTRAST:  104mL GADAVIST GADOBUTROL 1 MMOL/ML IV SOLN COMPARISON:  04/29/2020.  02/05/2020. FINDINGS: Brain: Diffusion imaging does not show any acute or subacute infarction. The brainstem and cerebellum are normal. Left cerebral hemispheres normal except for a few punctate white matter foci. No obstructive hydrocephalus. Previous right frontal craniotomy for resection of a large meningioma. No evidence of residual or recurrent tumor. Right frontal atrophy, encephalomalacia and gliosis. Small amount of extra-axial fluid along the craniotomy bone is slightly reduced. No sign of residual or recurrent tumor. Vascular: Major vessels at the base of the brain show flow. Skull and upper cervical spine: Negative other than the craniotomy changes. Sinuses/Orbits: Clear/normal Other: None IMPRESSION: Previous right frontal craniotomy for resection of a large meningioma. No evidence of residual or recurrent tumor. Right frontal atrophy, encephalomalacia and gliosis. Small amount of extra-axial fluid along the craniotomy bone is slightly reduced. No acute finding or any worsening/progressive finding. Electronically Signed   By: Nelson Chimes M.D.   On: 10/15/2020 02:38   EEG adult  Result Date: 10/15/2020 Lora Havens, MD     10/15/2020 10:47 AM Patient Name: Jeffery Reed MRN: [id number] Epilepsy Attending: Lora Havens Referring Physician/Provider: Dr Lesleigh Noe Date: 10/15/2020 Duration:  28.26 mins Patient history: 75 y.o. male with a past medical history significant for right frontal meningioma s/p resection (12/2019) with transient involuntary chewing movements. EEG to evaluate for seizure Level of alertness: Awake AEDs during EEG study: LEV Technical aspects: This EEG study was done with scalp electrodes positioned according to the 10-20 International system of electrode placement. Electrical activity was acquired at a sampling rate of 500Hz  and reviewed with a high frequency filter of 70Hz  and a low frequency filter of 1Hz . EEG data were recorded continuously and digitally stored. Description: The posterior dominant rhythm consists of 9 Hz activity of moderate voltage (25-35 uV) seen predominantly in posterior head regions, symmetric and reactive to eye opening and eye closing. Hyperventilation did not show any EEG change.  Physiologic photic driving was seen during photic stimulation.  IMPRESSION: This study is within normal limits. No seizures or epileptiform  discharges were seen throughout the recording. Priyanka Barbra Sarks       Subjective: Patient seen and examined at bedside.  Denies any overnight fever, nausea, vomiting or seizures.  Feels okay to go home today.  Discharge Exam: Vitals:   10/16/20 0348 10/16/20 0741  BP: 123/67 119/72  Pulse: 60 70  Resp: 19 18  Temp: 98 F (36.7 C) 98.7 F (37.1 C)  SpO2: 96% 94%    General: Pt is alert, awake, not in acute distress Cardiovascular: rate controlled, S1/S2 + Respiratory: bilateral decreased breath sounds at bases Abdominal: Soft, NT, ND, bowel sounds + Extremities: no edema, no cyanosis    The results of significant diagnostics from this hospitalization (including imaging, microbiology, ancillary and laboratory) are listed below for reference.     Microbiology: Recent Results (from the past 240 hour(s))  Resp Panel by RT-PCR (Flu A&B, Covid) Nasopharyngeal Swab     Status: None   Collection Time: 10/14/20  10:17 PM   Specimen: Nasopharyngeal Swab; Nasopharyngeal(NP) swabs in vial transport medium  Result Value Ref Range Status   SARS Coronavirus 2 by RT PCR NEGATIVE NEGATIVE Final    Comment: (NOTE) SARS-CoV-2 target nucleic acids are NOT DETECTED.  The SARS-CoV-2 RNA is generally detectable in upper respiratory specimens during the acute phase of infection. The lowest concentration of SARS-CoV-2 viral copies this assay can detect is 138 copies/mL. A negative result does not preclude SARS-Cov-2 infection and should not be used as the sole basis for treatment or other patient management decisions. A negative result may occur with  improper specimen collection/handling, submission of specimen other than nasopharyngeal swab, presence of viral mutation(s) within the areas targeted by this assay, and inadequate number of viral copies(<138 copies/mL). A negative result must be combined with clinical observations, patient history, and epidemiological information. The expected result is Negative.  Fact Sheet for Patients:  EntrepreneurPulse.com.au  Fact Sheet for Healthcare Providers:  IncredibleEmployment.be  This test is no t yet approved or cleared by the [place] FDA and  has been authorized for detection and/or diagnosis of SARS-CoV-2 by FDA under an Emergency Use Authorization (EUA). This EUA will remain  in effect (meaning this test can be used) for the duration of the COVID-19 declaration under Section 564(b)(1) of the Act, 21 U.S.C.section 360bbb-3(b)(1), unless the authorization is terminated  or revoked sooner.       Influenza A by PCR NEGATIVE NEGATIVE Final   Influenza B by PCR NEGATIVE NEGATIVE Final    Comment: (NOTE) The Xpert Xpress SARS-CoV-2/FLU/RSV plus assay is intended as an aid in the diagnosis of influenza from Nasopharyngeal swab specimens and should not be used as a sole basis for treatment. Nasal washings and aspirates  are unacceptable for Xpert Xpress SARS-CoV-2/FLU/RSV testing.  Fact Sheet for Patients: EntrepreneurPulse.com.au  Fact Sheet for Healthcare Providers: IncredibleEmployment.be  This test is not yet approved or cleared by the [place] FDA and has been authorized for detection and/or diagnosis of SARS-CoV-2 by FDA under an Emergency Use Authorization (EUA). This EUA will remain in effect (meaning this test can be used) for the duration of the COVID-19 declaration under Section 564(b)(1) of the Act, 21 U.S.C. section 360bbb-3(b)(1), unless the authorization is terminated or revoked.  Performed at [facility], [address] [place], [place] [address]      Labs: BNP (last 3 results) No results for input(s): BNP in the last 8760 hours. Basic Metabolic Panel: Recent Labs  Lab 10/14/20 2053 10/15/20  0356 10/16/20 0323  NA 138 139 141  K 3.8 3.3* 4.0  CL 104 102 109  CO2 23 25 25   GLUCOSE 99 119* 142*  BUN 36* 30* 23  CREATININE 2.04* 1.84* 1.78*  CALCIUM 9.8 9.4 9.1  MG  --   --  1.8   Liver Function Tests: Recent Labs  Lab 10/16/20 0323  AST 21  ALT 16  ALKPHOS 31*  BILITOT 1.0  PROT 6.2*  ALBUMIN 3.7   No results for input(s): LIPASE, AMYLASE in the last 168 hours. No results for input(s): AMMONIA in the last 168 hours. CBC: Recent Labs  Lab 10/14/20 2053 10/15/20 0356 10/16/20 0323  WBC 8.9 7.8 7.3  NEUTROABS 5.4  --  4.2  HGB 14.1 12.8* 12.9*  HCT 41.1 37.0* 38.9*  MCV 85.6 85.6 87.0  PLT 207 168 151   Cardiac Enzymes: No results for input(s): CKTOTAL, CKMB, CKMBINDEX, TROPONINI in the last 168 hours. BNP: Invalid input(s): POCBNP CBG: Recent Labs  Lab 10/15/20 1201 10/15/20 1314 10/15/20 1556 10/15/20 2119 10/16/20 0608  GLUCAP 183* 150* 136* 161* 143*   D-Dimer No results for input(s): DDIMER in the last 72 hours. Hgb A1c No results for input(s): HGBA1C in the last 72 hours. Lipid  Profile No results for input(s): CHOL, HDL, LDLCALC, TRIG, CHOLHDL, LDLDIRECT in the last 72 hours. Thyroid function studies No results for input(s): TSH, T4TOTAL, T3FREE, THYROIDAB in the last 72 hours.  Invalid input(s): FREET3 Anemia work up No results for input(s): VITAMINB12, FOLATE, FERRITIN, TIBC, IRON, RETICCTPCT in the last 72 hours. Urinalysis    Component Value Date/Time   COLORURINE YELLOW 03/02/2020 1451   APPEARANCEUR CLEAR 03/02/2020 1451   LABSPEC 1.020 03/02/2020 1451   PHURINE < OR = 5.0 03/02/2020 1451   GLUCOSEU NEGATIVE 03/02/2020 1451   HGBUR NEGATIVE 03/02/2020 1451   BILIRUBINUR NEGATIVE 12/28/2019 2142   KETONESUR NEGATIVE 03/02/2020 1451   PROTEINUR NEGATIVE 03/02/2020 1451   NITRITE NEGATIVE 03/02/2020 1451   LEUKOCYTESUR NEGATIVE 03/02/2020 1451   Sepsis Labs Invalid input(s): PROCALCITONIN,  WBC,  LACTICIDVEN Microbiology Recent Results (from the past 240 hour(s))  Resp Panel by RT-PCR (Flu A&B, Covid) Nasopharyngeal Swab     Status: None   Collection Time: 10/14/20 10:17 PM   Specimen: Nasopharyngeal Swab; Nasopharyngeal(NP) swabs in vial transport medium  Result Value Ref Range Status   SARS Coronavirus 2 by RT PCR NEGATIVE NEGATIVE Final    Comment: (NOTE) SARS-CoV-2 target nucleic acids are NOT DETECTED.  The SARS-CoV-2 RNA is generally detectable in upper respiratory specimens during the acute phase of infection. The lowest concentration of SARS-CoV-2 viral copies this assay can detect is 138 copies/mL. A negative result does not preclude SARS-Cov-2 infection and should not be used as the sole basis for treatment or other patient management decisions. A negative result may occur with  improper specimen collection/handling, submission of specimen other than nasopharyngeal swab, presence of viral mutation(s) within the areas targeted by this assay, and inadequate number of viral copies(<138 copies/mL). A negative result must be combined  with clinical observations, patient history, and epidemiological information. The expected result is Negative.  Fact Sheet for Patients:  EntrepreneurPulse.com.au  Fact Sheet for Healthcare Providers:  IncredibleEmployment.be  This test is no t yet approved or cleared by the [place] FDA and  has been authorized for detection and/or diagnosis of SARS-CoV-2 by FDA under an Emergency Use Authorization (EUA). This EUA will remain  in effect (meaning this test can be  used) for the duration of the COVID-19 declaration under Section 564(b)(1) of the Act, 21 U.S.C.section 360bbb-3(b)(1), unless the authorization is terminated  or revoked sooner.       Influenza A by PCR NEGATIVE NEGATIVE Final   Influenza B by PCR NEGATIVE NEGATIVE Final    Comment: (NOTE) The Xpert Xpress SARS-CoV-2/FLU/RSV plus assay is intended as an aid in the diagnosis of influenza from Nasopharyngeal swab specimens and should not be used as a sole basis for treatment. Nasal washings and aspirates are unacceptable for Xpert Xpress SARS-CoV-2/FLU/RSV testing.  Fact Sheet for Patients: EntrepreneurPulse.com.au  Fact Sheet for Healthcare Providers: IncredibleEmployment.be  This test is not yet approved or cleared by the [place] FDA and has been authorized for detection and/or diagnosis of SARS-CoV-2 by FDA under an Emergency Use Authorization (EUA). This EUA will remain in effect (meaning this test can be used) for the duration of the COVID-19 declaration under Section 564(b)(1) of the Act, 21 U.S.C. section 360bbb-3(b)(1), unless the authorization is terminated or revoked.  Performed at [facility], [address], [place], [place] [address]      Time coordinating discharge: 35 minutes  SIGNED:   Aline August, MD  [facility] 10/16/2020, 9:29 AM

## 2020-10-16 NOTE — Plan of Care (Signed)

## 2020-10-16 NOTE — Plan of Care (Signed)
Adequate for discharge.

## 2020-10-19 ENCOUNTER — Telehealth: Payer: Self-pay | Admitting: *Deleted

## 2020-10-19 NOTE — Telephone Encounter (Signed)
Called patient on 10/19/2020 , 9:21 AM in an attempt to reach the patient for a hospital follow up. Spoke with the patient. States he is doing better.  Admit date: 10/14/20 Discharge: 10/16/20   He does not have any questions or concerns about medications from the hospital admission. The patient's medications were reviewed over the phone, they were counseled to bring in all current medications to the hospital follow up visit.   I advised the patient to call if any questions or concerns arise about the hospital admission or medications    Home health was not started in the hospital.  All questions were answered and a follow up appointment was made. Patient has a hospital follow up with Dr Melford Aase on 10/25/2020.  Prior to Admission medications   Medication Sig Start Date End Date Taking? Authorizing Provider  acetaminophen (TYLENOL) 650 MG CR tablet Take 650 mg by mouth every 8 (eight) hours as needed for pain (or headaches).    [provider]  Cholecalciferol (VITAMIN D3) 10000 units capsule Take 10,000 Units by mouth at bedtime.     [provider]  citalopram (CELEXA) 20 MG tablet TAKE 1 TABLET BY MOUTH  DAILY FOR MOOD Patient taking differently: Take 20 mg by mouth daily. 05/18/20   Garnet Sierras, NP  fenofibrate 160 MG tablet Take      1 tablet      Daily       for Triglycerides (Blood Fats) Patient taking differently: Take 160 mg by mouth daily. 05/20/20   Liane Comber, NP  fexofenadine (ALLEGRA) 180 MG tablet Take 180 mg by mouth daily as needed for allergies or rhinitis.    [provider]  fluticasone (FLONASE) 50 MCG/ACT nasal spray Place 2 sprays into both nostrils daily as needed for allergies or rhinitis. 10/16/20   Aline August, MD  glipiZIDE (GLUCOTROL) 5 MG tablet TAKE 1 TO 2 TABLETS BY  MOUTH 3 TIMES A DAY WITH  MEALS FOR DIABETES Patient taking differently: Take 5-10 mg by mouth 3 (three) times daily with meals. 07/27/20   Garnet Sierras, NP   glucose blood (FREESTYLE LITE) test strip Check blood sugar 1 time daily-DX-E11.22 01/30/20   Vladimir Crofts, PA-C  levETIRAcetam (KEPPRA) 500 MG tablet Take 1 tablet (500 mg total) by mouth 2 (two) times daily. 10/16/20   Aline August, MD  mupirocin ointment (BACTROBAN) 2 % APPLY TWICE A DAY TO SKIN INFECTION. Patient taking differently: Apply 1 application topically 2 (two) times daily as needed (skin infection). 09/23/20   Unk Pinto, MD  omeprazole (PRILOSEC) 40 MG capsule Take     1 capsule     Daily      to Prevent Indigestion & Heartburn Patient taking differently: Take 40 mg by mouth daily. 05/20/20   Liane Comber, NP  polyethylene glycol (MIRALAX / GLYCOLAX) 17 g packet Take 17 g by mouth daily as needed for mild constipation. 01/02/20   Kayleen Memos, DO  pyridOXINE (B-6) 50 MG tablet Take 1 tablet (50 mg total) by mouth daily. 10/16/20   Aline August, MD  rosuvastatin (CRESTOR) 10 MG tablet TAKE 1 TABLET BY MOUTH  DAILY FOR CHOLESTEROL Patient taking differently: Take 10 mg by mouth daily. TAKE 1 TABLET BY MOUTH  DAILY FOR CHOLESTEROL 10/02/20   Liane Comber, NP

## 2020-10-20 ENCOUNTER — Ambulatory Visit
Admission: RE | Admit: 2020-10-20 | Discharge: 2020-10-20 | Disposition: A | Discharge status: Home / Self Care | Payer: Medicare Other | Source: Ambulatory Visit | Attending: Nephrology | Admitting: Nephrology

## 2020-10-20 DIAGNOSIS — N400 Enlarged prostate without lower urinary tract symptoms: Secondary | ICD-10-CM | POA: Diagnosis not present

## 2020-10-20 DIAGNOSIS — N1832 Chronic kidney disease, stage 3b: Secondary | ICD-10-CM

## 2020-10-20 DIAGNOSIS — N281 Cyst of kidney, acquired: Secondary | ICD-10-CM | POA: Diagnosis not present

## 2020-10-20 DIAGNOSIS — N189 Chronic kidney disease, unspecified: Secondary | ICD-10-CM | POA: Diagnosis not present

## 2020-10-20 IMAGING — US US RENAL
1 series · 13 of 25 positions shown · non-contrast
Comparison: [DATE].

CLINICAL DATA: Follow-up renal cyst.  Chronic kidney disease.

EXAM:
RENAL / URINARY TRACT ULTRASOUND COMPLETE

[Series 1: us renal · 0.25mm/px · 13 of 55 slices shown]
[im 1/55]
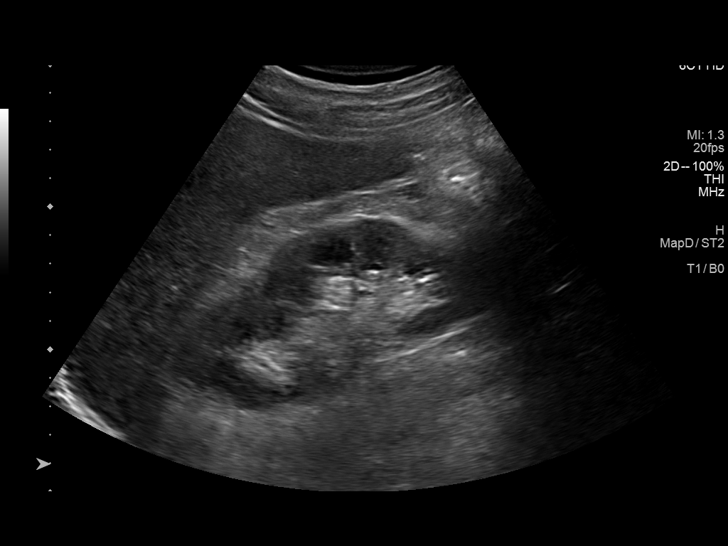
[im 5/55]
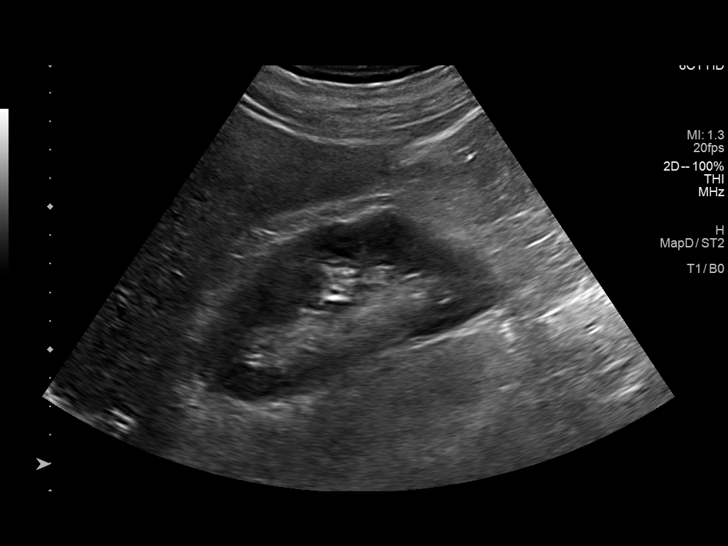
[im 10/55]
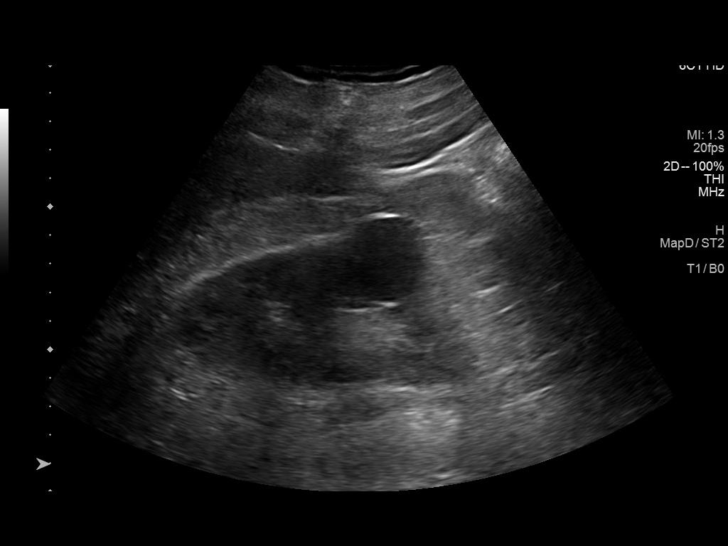
[im 14/55]
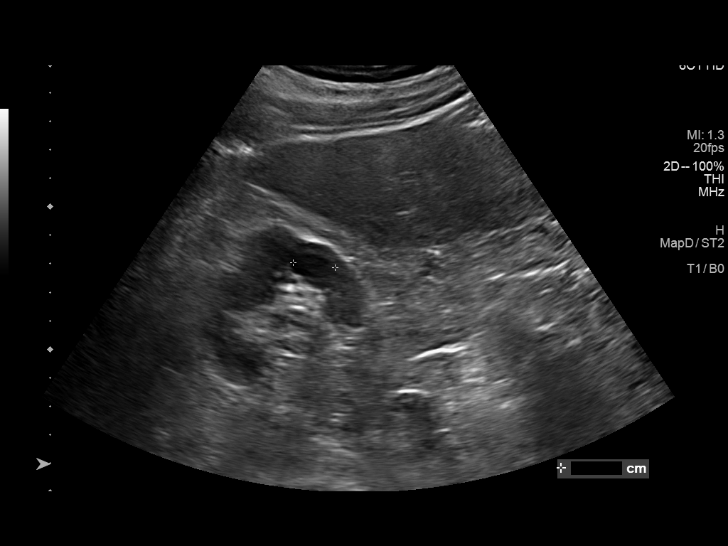
[im 19/55]
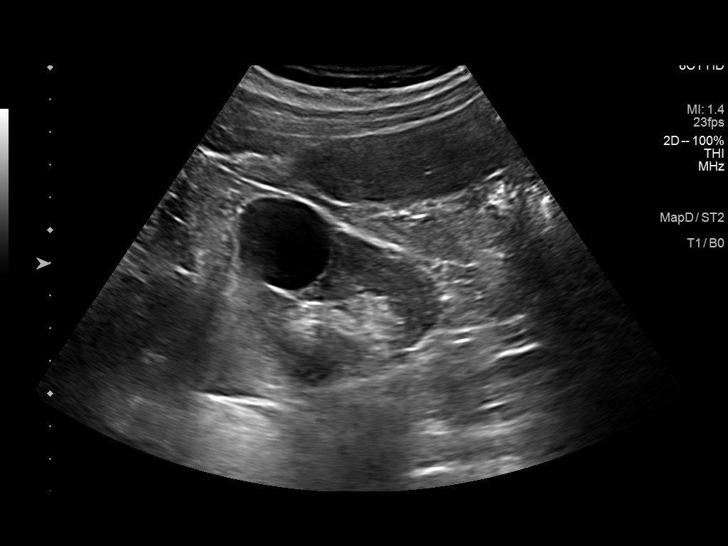
[im 23/55]
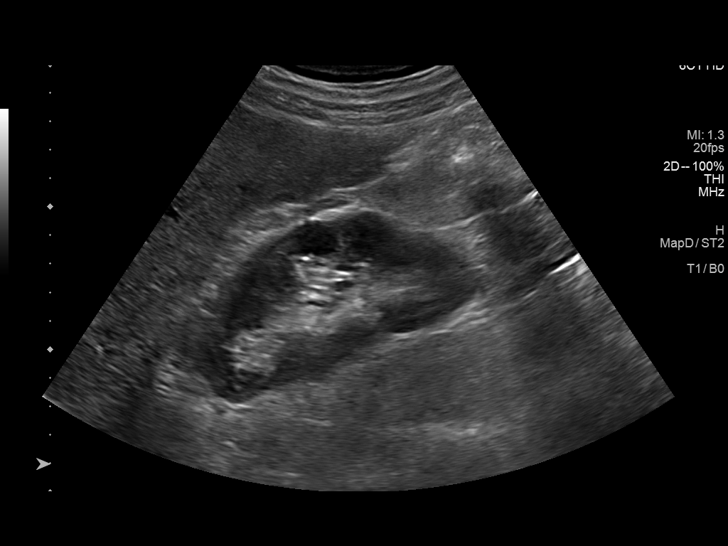
[im 28/55]
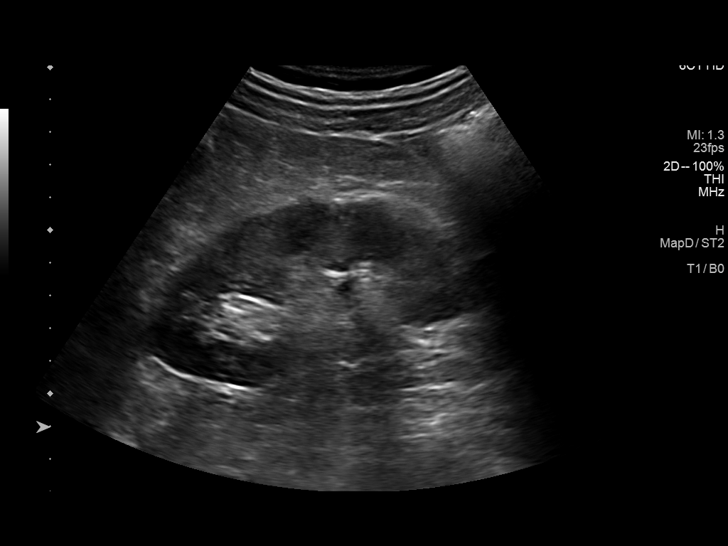
[im 32/55]
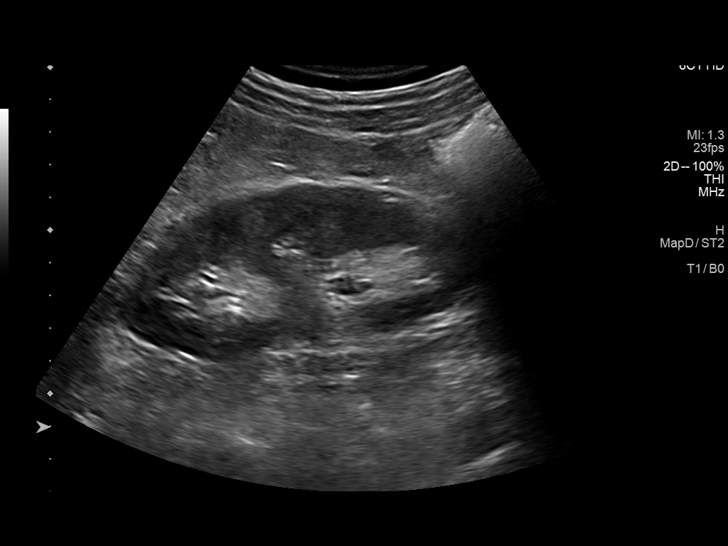
[im 37/55]
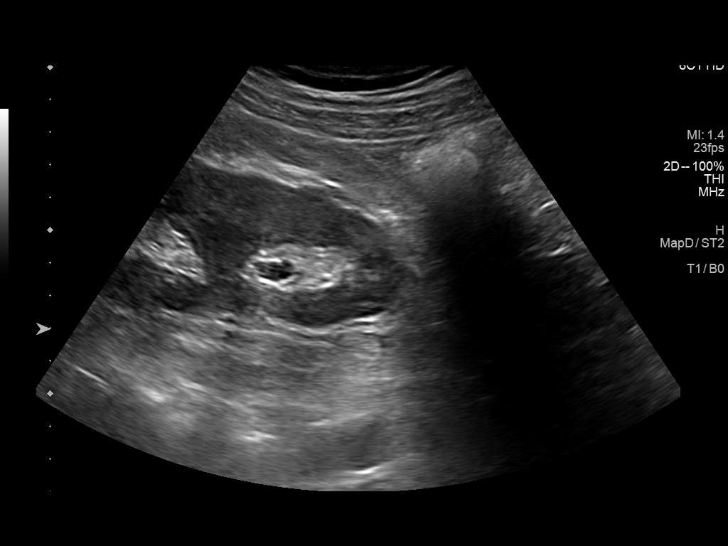
[im 41/55]
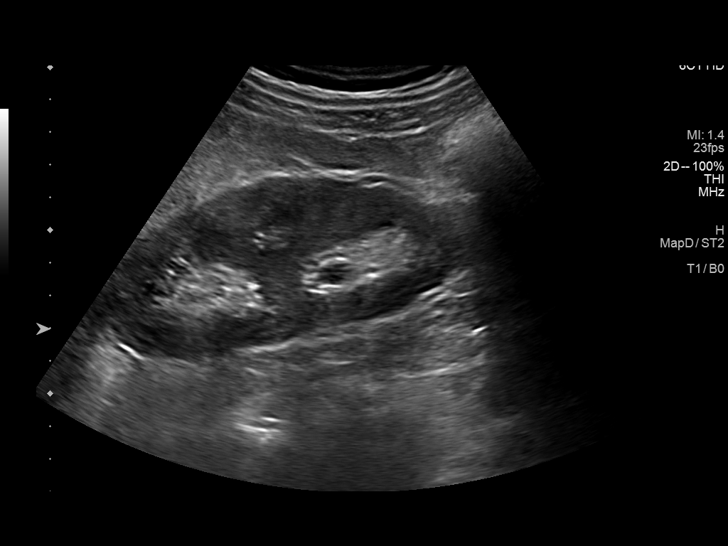
[im 46/55]
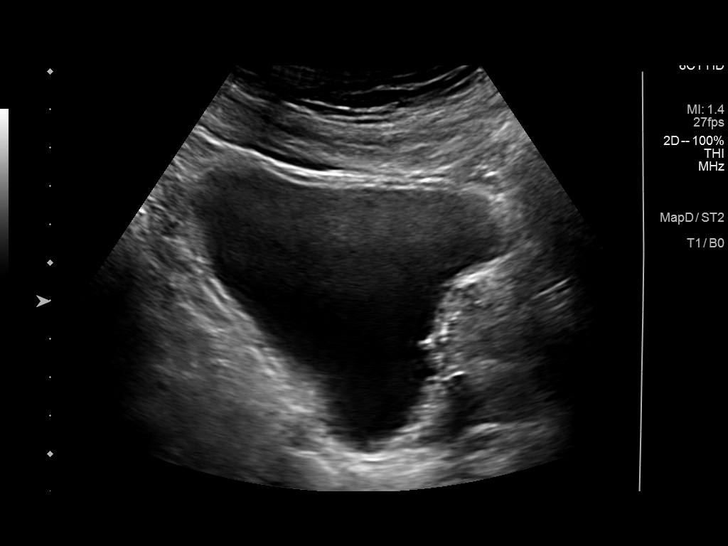
[im 50/55]
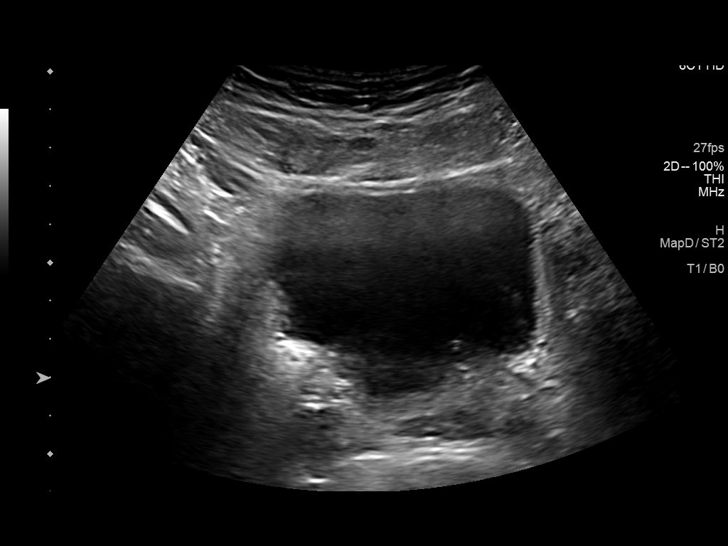
[im 55/55]
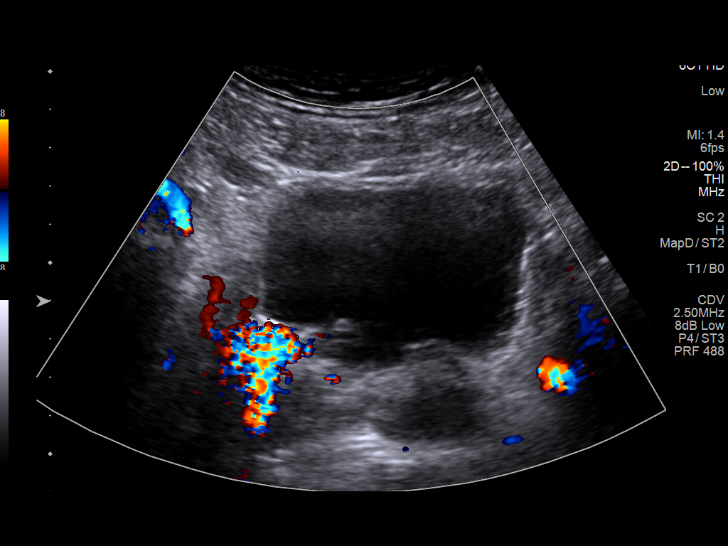

[13 of 25 positions shown; findings below may reference images not displayed]

FINDINGS: Right Kidney:

Renal measurements: 10.8 x 4.9 x 5.7 cm = volume: 159.2 mL. Normal
parenchymal echogenicity. Simple appearing cyst arises from midpole
measuring 3.1 x 3.1 x 3.2 cm, previously 2.2 x 2.7 x 2.4 cm. There
is a smaller adjacent cyst that measures 1.5 x 1.2 x 1.5 cm. This
has no significant complicating features. It was not visualized on
the prior ultrasound. No other renal masses or lesions. No stones.
No hydronephrosis.

Left Kidney:

Renal measurements: 11.9 x 5.0 x 5.4 cm = volume: 165.6 mL.
Echogenicity within normal limits. No mass or hydronephrosis
visualized.

Bladder:

Appears normal for degree of bladder distention.

Other:

Enlarged prostate that elevates the bladder base. Prostate measures
5.7 cm in greatest transverse dimension, similar to the prior exam
allowing for measurement technique differences.
IMPRESSION: 1. Two right renal cysts, both at the midpole, both appearing
simple, Bosniak category 1, on the current exam. Largest cyst is
increased in size from prior study and the smaller cyst is either
new or was not visualized previously. No follow-up recommended.
2. No other renal abnormalities.
3. Prostate hypertrophy.

## 2020-10-24 ENCOUNTER — Encounter: Payer: Self-pay | Admitting: Internal Medicine

## 2020-10-24 NOTE — Patient Instructions (Addendum)
Due to recent changes in healthcare laws, you may see the results of your imaging and laboratory studies on MyChart before your provider has had a chance to review them.  We understand that in some cases there may be results that are confusing or concerning to you. Not all laboratory results come back in the same time frame and the provider may be waiting for multiple results in order to interpret others.  Please give [place] 48 hours in order for your provider to thoroughly review all the results before contacting the office for clarification of your results.      Seizure, Adult  A seizure is a sudden burst of abnormal electrical and chemical activity in the brain. Seizures usually last from 30 seconds to 2 minutes. The abnormal activity temporarily interrupts normal brain function. Many types of seizures can affect adults. A seizure can cause many different symptoms depending on where in the brain it starts. What are the causes? Common causes of this condition include:  Fever or infection.  Brain injury, head trauma, bleeding in the brain, or a brain tumor.  Low levels of blood sugar or salt (sodium).  Kidney problems or liver problems.  Metabolic disorders or other conditions that are passed from parent to child (are inherited).  Reaction to a substance, such as a drug or a medicine, or suddenly stopping the use of a substance (withdrawal).  A stroke.  Developmental disorders such as autism spectrum disorder or cerebral palsy. In some cases, the cause of a seizure may not be known. Some people who have a seizure never have another one. A person who has repeated seizures over time without a clear cause has a condition called epilepsy. What increases the risk? You are more likely to develop this condition if:  You have a family history of epilepsy.  You have had a tonic-clonic seizure before. This type of seizure causes tightening (contraction) of the muscles of the whole body and loss  of consciousness.  You have a history of head trauma, lack of oxygen at birth, or strokes. What are the signs or symptoms? There are many different types of seizures. The symptoms vary depending on the type of seizure you have. Symptoms occur during the seizure. They may also occur before a seizure (aura) and after a seizure (postictal). Symptoms may include the following: Symptoms during a seizure  Uncontrollable shaking (convulsions) with fast, jerky movements of muscles.  Stiffening of the body.  Breathing problems.  Confusion, staring, or unresponsiveness.  Head nodding, eye blinking or fluttering, or rapid eye movements.  Drooling, grunting, or making clicking sounds with your mouth.  Loss of bladder control and bowel control. Symptoms before a seizure  Fear or anxiety.  Nausea.  Vertigo. This is a feeling like: ? You are moving when you are not. ? Your surroundings are moving when they are not.  Dj vu. This is a feeling of having seen or heard something before.  Odd tastes or smells.  Changes in vision, such as seeing flashing lights or spots. Symptoms after a seizure  Confusion.  Sleepiness.  Headache.  Sore muscles. How is this diagnosed? This condition may be diagnosed based on:  A description of your symptoms. Video of your seizures can be helpful.  Your medical history.  A physical exam. You may also have tests, including:  Blood tests.  CT scan.  MRI.  Electroencephalogram (EEG). This test measures electrical activity in the brain. An EEG can predict whether seizures will return.  A spinal tap, also called a lumbar puncture. This is the removal and testing of fluid that surrounds the brain and spinal cord. How is this treated? Most seizures will stop on their own in less than 5 minutes, and no treatment is needed. Seizures that last longer than 5 minutes will usually need treatment. Seizures may be treated with:  Medicines given through  an IV.  Avoiding known triggers, such as medicines that you take for another condition.  Medicines to control seizures or prevent future seizures (antiepileptics), if epilepsy caused your seizures.  Medical devices to prevent and control seizures.  Surgery to stop seizures or to reduce how often seizures happen, if you have epilepsy that does not respond to medicines.  A diet low in carbohydrates and high in fat (ketogenic diet). Follow these instructions at home: Medicines  Take over-the-counter and prescription medicines only as told by your health care provider.  Avoid any substances that may prevent your medicine from working properly, such as alcohol. Activity  Follow instructions about activities, such as driving or swimming, that would be dangerous if you had another seizure. Wait until your health care provider says it is safe to do them.  If you live in the [place] check with your local department of motor vehicles [facility]) to find out about local driving laws. Each state has specific rules about when you can legally drive again.  Get enough rest. Lack of sleep can make seizures more likely to occur. Educating others  Teach friends and family what to do if you have a seizure. They should: ? Help you get down to the ground, to prevent a fall. ? Cushion your head and move items away from your body. ? Loosen any tight clothing around your neck. ? Turn you on your side. If you vomit, this helps keep your airway clear. ? Know whether or not you need emergency care. ? Stay with you until you recover.  Also, tell them what not to do if you have a seizure. Tell them: ? They should not hold you down. Holding you down will not stop the seizure. ? They should not put anything in your mouth.   General instructions  Avoid anything that has ever triggered a seizure for you.  Keep a seizure diary. Record what you remember about each seizure, especially anything that might have triggered  it.  Keep all follow-up visits. This is important. Contact a health care provider if:  You have another seizure or seizures. Call each time you have a seizure.  Your seizure pattern changes.  You continue to have seizures with treatment.  You have symptoms of an infection or illness. Either of these might increase your risk of having a seizure.  You are unable to take your medicine. Get help right away if:  You have: ? A seizure that does not stop after 5 minutes. ? Several seizures in a row without a complete recovery between seizures. ? A seizure that makes it harder to breathe. ? A seizure that leaves you unable to speak or use a part of your body.  You do not wake up right away after a seizure.  You injure yourself during a seizure.  You have confusion or pain right after a seizure. These symptoms may represent a serious problem that is an emergency. Do not wait to see if the symptoms will go away. Get medical help right away. Call your local emergency services (911 in the [place] Do not drive yourself to  the hospital. Summary  Seizures are caused by abnormal electrical and chemical activity in the brain. The activity disrupts normal brain function and can cause various symptoms.  Seizures have many causes, including illness, head injuries, low levels of blood sugar or salt, and certain conditions.  Most seizures will stop on their own in less than 5 minutes. Seizures that last longer than 5 minutes are a medical emergency and need treatment right away.  Many medicines are used to treat seizures. Take over-the-counter and prescription medicines only as told by your health care provider. This information is not intended to replace advice given to you by your health care provider. Make sure you discuss any questions you have with your health care provider. Document Revised: 02/13/2020 Document Reviewed: 02/13/2020 Elsevier Patient Education  2021 [place].   ++++++++++++++++++++++++++++++++++  Vit D  & Vit C 1,000 mg   are recommended to help protect  against the Covid-19 and other Corona viruses.    Also it's recommended  to take  Zinc 50 mg  to help  protect against the Covid-19   and best place to get  is also on [email or web address]  and don't pay more than 6-8 cents /pill !   ===================================== Coronavirus (COVID-19) Are you at risk?  Are you at risk for the Coronavirus (COVID-19)?  To be considered HIGH RISK for Coronavirus (COVID-19), you have to meet the following criteria:  . Traveled to [place], [place], [place], [place] or [place]; or in the [place] to [place], [place], [place]  . or [place]; and have fever, cough, and shortness of breath within the last 2 weeks of travel OR . Been in close contact with a person diagnosed with COVID-19 within the last 2 weeks and have  . fever, cough,and shortness of breath .  . IF YOU DO NOT MEET THESE CRITERIA, YOU ARE CONSIDERED LOW RISK FOR COVID-19.  What to do if you are HIGH RISK for COVID-19?  Marland Kitchen If you are having a medical emergency, call 911. . Seek medical care right away. Before you go to a doctor's office, urgent care or emergency department, .  call ahead and tell them about your recent travel, contact with someone diagnosed with COVID-19  .  and your symptoms.  . You should receive instructions from your physician's office regarding next steps of care.  . When you arrive at healthcare provider, tell the healthcare staff immediately you have returned from  . visiting [place], [place], [place], [place] or [place]; or traveled in the [place] to [place], [place],  . [place] or [place] in the last two weeks or you have been in close contact with a person diagnosed with  . COVID-19 in the last 2 weeks.   . Tell the health care staff about your symptoms: fever, cough and shortness of breath. . After you have been seen by a medical  provider, you will be either: o Tested for (COVID-19) and discharged home on quarantine except to seek medical care if  o symptoms worsen, and asked to  - Stay home and avoid contact with others until you get your results (4-5 days)  - Avoid travel on public transportation if possible (such as bus, train, or airplane) or o Sent to the Emergency Department by EMS for evaluation, COVID-19 testing  and  o possible admission depending on your condition and test results.  What to do if you are LOW RISK for COVID-19?  Reduce your  risk of any infection by using the same precautions used for avoiding the common cold or flu:  Marland Kitchen Wash your hands often with soap and warm water for at least 20 seconds.  If soap and water are not readily available,  . use an alcohol-based hand sanitizer with at least 60% alcohol.  . If coughing or sneezing, cover your mouth and nose by coughing or sneezing into the elbow areas of your shirt or coat, .  into a tissue or into your sleeve (not your hands). . Avoid shaking hands with others and consider head nods or verbal greetings only. . Avoid touching your eyes, nose, or mouth with unwashed hands.  . Avoid close contact with people who are sick. . Avoid places or events with large numbers of people in one location, like concerts or sporting events. . Carefully consider travel plans you have or are making. . If you are planning any travel outside or inside the [place], visit the CDC's Travelers' Health webpage for the latest health notices. . If you have some symptoms but not all symptoms, continue to monitor at home and seek medical attention  . if your symptoms worsen. . If you are having a medical emergency, call 911.   ++++++++++++++++++++++++++++++++ Recommend Adult Low Dose Aspirin or  coated  Aspirin 81 mg daily  To reduce risk of Colon Cancer 40 %,  Skin Cancer 26 % ,  Melanoma 46%  and  Pancreatic cancer 60% ++++++++++++++++++++++++++++++++ Vitamin D goal   is between 70-100.  Please make sure that you are taking your Vitamin D as directed.  It is very important as a natural anti-inflammatory  helping hair, skin, and nails, as well as reducing stroke and heart attack risk.  It helps your bones and helps with mood. It also decreases numerous cancer risks so please take it as directed.  Low Vit D is associated with a 200-300% higher risk for CANCER  and 200-300% higher risk for HEART   ATTACK  &  STROKE.   .....................................Marland Kitchen It is also associated with higher death rate at younger ages,  autoimmune diseases like Rheumatoid arthritis, Lupus, Multiple Sclerosis.    Also many other serious conditions, like depression, Alzheimer's Dementia, infertility, muscle aches, fatigue, fibromyalgia - just to name a few. ++++++++++++++++++++ Recommend the book "The END of DIETING" by Dr Excell Seltzer  & the book "The END of DIABETES " by Dr Excell Seltzer At [email or web address] - get book & Audio CD's    Being diabetic has a  300% increased risk for heart attack, stroke, cancer, and alzheimer- type vascular dementia. It is very important that you work harder with diet by avoiding all foods that are white. Avoid white rice (brown & wild rice is OK), white potatoes (sweetpotatoes in moderation is OK), White bread or wheat bread or anything made out of white flour like bagels, donuts, rolls, buns, biscuits, cakes, pastries, cookies, pizza crust, and pasta (made from white flour & egg whites) - vegetarian pasta or spinach or wheat pasta is OK. Multigrain breads like Arnold's or Pepperidge [employer], or multigrain sandwich thins or flatbreads.  Diet, exercise and weight loss can reverse and cure diabetes in the early stages.  Diet, exercise and weight loss is very important in the control and prevention of complications of diabetes which affects every system in your body, ie. Brain - dementia/stroke, eyes - glaucoma/blindness, heart - heart attack/heart failure, kidneys -  dialysis, stomach - gastric paralysis, intestines - malabsorption, nerves - severe painful  neuritis, circulation - gangrene & loss of a leg(s), and finally cancer and Alzheimers.    I recommend avoid fried & greasy foods,  sweets/candy, white rice (brown or wild rice or Quinoa is OK), white potatoes (sweet potatoes are OK) - anything made from white flour - bagels, doughnuts, rolls, buns, biscuits,white and wheat breads, pizza crust and traditional pasta made of white flour & egg white(vegetarian pasta or spinach or wheat pasta is OK).  Multi-grain bread is OK - like multi-grain flat bread or sandwich thins. Avoid alcohol in excess. Exercise is also important.    Eat all the vegetables you want - avoid meat, especially red meat and dairy - especially cheese.  Cheese is the most concentrated form of trans-fats which is the worst thing to clog up our arteries. Veggie cheese is OK which can be found in the fresh produce section at [employer] or [employer] or [employer]  +++++++++++++++++++++ DASH Eating Plan  DASH stands for "Dietary Approaches to Stop Hypertension."   The DASH eating plan is a healthy eating plan that has been shown to reduce high blood pressure (hypertension). Additional health benefits may include reducing the risk of type 2 diabetes mellitus, heart disease, and stroke. The DASH eating plan may also help with weight loss. WHAT DO I NEED TO KNOW ABOUT THE DASH EATING PLAN? For the DASH eating plan, you will follow these general guidelines:  Choose foods with a percent daily value for sodium of less than 5% (as listed on the food label).  Use salt-free seasonings or herbs instead of table salt or sea salt.  Check with your health care provider or pharmacist before using salt substitutes.  Eat lower-sodium products, often labeled as "lower sodium" or "no salt added."  Eat fresh foods.  Eat more vegetables, fruits, and low-fat dairy products.  Choose whole grains. Look for  the word "whole" as the first word in the ingredient list.  Choose fish   Limit sweets, desserts, sugars, and sugary drinks.  Choose heart-healthy fats.  Eat veggie cheese   Eat more home-cooked food and less restaurant, buffet, and fast food.  Limit fried foods.  Cook foods using methods other than frying.  Limit canned vegetables. If you do use them, rinse them well to decrease the sodium.  When eating at a restaurant, ask that your food be prepared with less salt, or no salt if possible.                      WHAT FOODS CAN I EAT? Read Dr Fara Olden Fuhrman's books on The End of Dieting & The End of Diabetes  Grains Whole grain or whole wheat bread. Brown rice. Whole grain or whole wheat pasta. Quinoa, bulgur, and whole grain cereals. Low-sodium cereals. Corn or whole wheat flour tortillas. Whole grain cornbread. Whole grain crackers. Low-sodium crackers.  Vegetables Fresh or frozen vegetables (raw, steamed, roasted, or grilled). Low-sodium or reduced-sodium tomato and vegetable juices. Low-sodium or reduced-sodium tomato sauce and paste. Low-sodium or reduced-sodium canned vegetables.   Fruits All fresh, canned (in natural juice), or frozen fruits.  Protein Products  All fish and seafood.  Dried beans, peas, or lentils. Unsalted nuts and seeds. Unsalted canned beans.  Dairy Low-fat dairy products, such as skim or 1% milk, 2% or reduced-fat cheeses, low-fat ricotta or cottage cheese, or plain low-fat yogurt. Low-sodium or reduced-sodium cheeses.  Fats and Oils Tub margarines without trans fats. Light or reduced-fat mayonnaise and salad dressings (reduced sodium).  Avocado. Safflower, olive, or canola oils. Natural peanut or almond butter.  Other Unsalted popcorn and pretzels. The items listed above may not be a complete list of recommended foods or beverages. Contact your dietitian for more options.  +++++++++++++++  WHAT FOODS ARE NOT RECOMMENDED? Grains/ White flour or  wheat flour White bread. White pasta. White rice. Refined cornbread. Bagels and croissants. Crackers that contain trans fat.  Vegetables  Creamed or fried vegetables. Vegetables in a . Regular canned vegetables. Regular canned tomato sauce and paste. Regular tomato and vegetable juices.  Fruits Dried fruits. Canned fruit in light or heavy syrup. Fruit juice.  Meat and Other Protein Products Meat in general - RED meat & White meat.  Fatty cuts of meat. Ribs, chicken wings, all processed meats as bacon, sausage, bologna, salami, fatback, hot dogs, bratwurst and packaged luncheon meats.  Dairy Whole or 2% milk, cream, half-and-half, and cream cheese. Whole-fat or sweetened yogurt. Full-fat cheeses or blue cheese. Non-dairy creamers and whipped toppings. Processed cheese, cheese spreads, or cheese curds.  Condiments Onion and garlic salt, seasoned salt, table salt, and sea salt. Canned and packaged gravies. Worcestershire sauce. Tartar sauce. Barbecue sauce. Teriyaki sauce. Soy sauce, including reduced sodium. Steak sauce. Fish sauce. Oyster sauce. Cocktail sauce. Horseradish. Ketchup and mustard. Meat flavorings and tenderizers. Bouillon cubes. Hot sauce. Tabasco sauce. Marinades. Taco seasonings. Relishes.  Fats and Oils Butter, stick margarine, lard, shortening and bacon fat. Coconut, palm kernel, or palm oils. Regular salad dressings.  Pickles and olives. Salted popcorn and pretzels.  The items listed above may not be a complete list of foods and beverages to avoid.

## 2020-10-24 NOTE — Progress Notes (Unsigned)
[place]     This very nice 75 y.o.  MWM  was admitted to the hospital on  10/14/2020 and patient was discharged from the hospital on 10/16/2020. The patient now presents for follow up for transition from recent hospitalization.  The day after discharge  our clinical staff contacted the patient to assure stability and schedule a follow up appointment. The discharge summary, medications and diagnostic test results were reviewed before meeting with the patient. The patient was admitted for:   Seizures ([place]) AKI (acute kidney injury) ([place]) Essential hypertension Hyperlipidemia associated with type 2 diabetes mellitus ([place]) Type 2 diabetes mellitus with stage 3a chronic kidney disease, with long-term current use of insulin ([place])      Hospitalization discharge instructions and medications are reconciled with the patient.      This very nice 75 y.o. MWM with HTN, HLD, T2_IDDM, OSA/CPAP, Gout  and Vitamin D Deficiency and he underwent Craniotomy in May 2021 for a large Rt frontal lobe Meningoma and  as discharged on Keppra. Post-op , patient did well with no seizures and his Keppra was d/c'd after 6 months in Sept 2021. Patient continued to do well until the day of admission, he experienced a focal seizure of the Rt face and was hospitalized . Patient was found dehydrated compromising his CKD3a & he was rehydrated with his  renal functions returning to his baseline. His Metformin was accordingly withheld with his AKI. Consultation by  Neurohospitalist, Dr Curly Shores who recommended  restarting Keppra. During hospitalization, Brain CT & MRI did not show  acute intracranial abnormality and EEG was negative for active seizure.      Patient is treated for HTN & BP has been controlled at home. Today's BP: is at goal - 124/70. Patient has had no complaints of any cardiac type chest pain, palpitations, dyspnea/orthopnea/PND, dizziness, claudication, or dependent edema.     Hyperlipidemia is  controlled with diet & meds. Patient denies myalgias or other med SE's. Recent Lipids were not at goal:  Lab Results  Component Value Date   CHOL 168 09/23/2020   HDL 31 (L) 09/23/2020   LDLCALC 109 (H) 09/23/2020   TRIG 161 (H) 09/23/2020   CHOLHDL 5.4 (H) 09/23/2020      Also, the patient has history of T2_NIDDM and has had no symptoms of reactive hypoglycemia, diabetic polys, paresthesias or visual blurring.   Patient is only taking glipizide and recent A1c was near goal:  Lab Results  Component Value Date   HGBA1C 5.7 (A) 09/20/2020      Further, the patient also has history of Vitamin D Deficiency and supplements vitamin D without any suspected side-effects. Last vitamin D was  Still low  (goal 70-100):  Lab Results  Component Value Date   VD25OH 48 09/23/2020   Current Outpatient Medications on File Prior to Visit  Medication Sig  . acetaminophen 650 MG CR tablet Take  every 8  hours as needed for pain  . VITAMIN D 10,000 u  Take  at bedtime.   . citalopram  20 MG tablet TAKE 1 TABLET DAILY   . fenofibrate 160 MG tablet Take 1 tablet Daily  . fexofenadine  180 MG tablet Take 1 tablet daily as needed   . FLONASEnasal spray Place 2 sprays into both nostrils daily as needed   . glipiZIDE  5 MG tablet Take  1 - 2 tablets   3 x /day w/meals  . levETIRAcetam 500 MG tablet Take 1  tablet  2  times daily.  . mupirocin ointment 2 % APPLY TWICE A DAY TO SKIN INFECTION.   Marland Kitchen omeprazole 40 MG capsule Take  1 capsule Daily   . polyethylene glycol  17 g packet Take 17 g by mouth daily as needed for mild constipation.  Marland Kitchen pyridOXINE (B-6) 50 MG tablet Take 1 tablet (50 mg total) by mouth daily.  . rosuvastatin  10 MG tablet TAKE 1 TABLET  DAILY     Allergies  Allergen Reactions  . Cardura [Doxazosin Mesylate] Other (See Comments)    Nasal congestion  . Codeine Other (See Comments)    The patient passed out  . Hytrin [Terazosin] Other (See Comments)    Reaction not recalled   PMHx:    Past Medical History:  Diagnosis Date  . Allergy   . Anxiety   . Cataract    beginning stage 01/22/2019  . Chronic kidney disease    enlarge bladder  . Diabetes mellitus without complication ([place])   . GERD (gastroesophageal reflux disease)   . Gout   . Hyperlipidemia   . Hypertension   . OSA (obstructive sleep apnea)   . Pre-diabetes   . Sleep apnea    c pap   Immunization History  Administered Date(s) Administered  . DT (Pediatric) 06/01/2015  . Fluad Quad(high Dose 65+) 05/20/2020  . Influenza Whole 06/05/2013  . Influenza, High Dose Seasonal PF 06/05/2014, 06/01/2015, 06/14/2016, 06/22/2017, 06/11/2018, 05/13/2019  . PFIZER(Purple Top)SARS-COV-2 Vaccination 09/27/2019, 10/20/2019  . Pneumococcal Conjugate-13 08/19/2014  . Pneumococcal Polysaccharide-23 08/22/2003, 12/10/2015  . Tdap 03/08/2004  . Zoster 08/18/2013   Past Surgical History:  Procedure Laterality Date  . COLONOSCOPY    . CRANIOTOMY Right 12/29/2019   Procedure: Craniotomy for Tumor;  Surgeon: Kristeen Miss, MD;  Location: [facility];  Service: Neurosurgery;  Laterality: Right;  Craniotomy for Tumor Excision  . FOOT SURGERY  10/2009  . MYRINGOTOMY Left 02-2005   Dr.Newman  . TONSILLECTOMY  1953   FHx:    Reviewed / unchanged  SHx:    Reviewed / unchanged  Systems Review:  Constitutional: Denies fever, chills, wt changes, headaches, insomnia, fatigue, night sweats, change in appetite. Eyes: Denies redness, blurred vision, diplopia, discharge, itchy, watery eyes.  ENT: Denies discharge, congestion, post nasal drip, epistaxis, sore throat, earache, hearing loss, dental pain, tinnitus, vertigo, sinus pain, snoring.  CV: Denies chest pain, palpitations, irregular heartbeat, syncope, dyspnea, diaphoresis, orthopnea, PND, claudication or edema. Respiratory: denies cough, dyspnea, DOE, pleurisy, hoarseness, laryngitis, wheezing.  Gastrointestinal: Denies dysphagia, odynophagia, heartburn, reflux, water brash,  abdominal pain or cramps, nausea, vomiting, bloating, diarrhea, constipation, hematemesis, melena, hematochezia  or hemorrhoids. Genitourinary: Denies dysuria, frequency, urgency, nocturia, hesitancy, discharge, hematuria or flank pain. Musculoskeletal: Denies arthralgias, myalgias, stiffness, jt. swelling, pain, limping or strain/sprain.  Skin: Denies pruritus, rash, hives, warts, acne, eczema or change in skin lesion(s). Neuro: No weakness, tremor, incoordination, spasms, paresthesia or pain. Psychiatric: Denies confusion, memory loss or sensory loss. Endo: Denies change in weight, skin or hair change.  Heme/Lymph: No excessive bleeding, bruising or enlarged lymph nodes.  Physical Exam  BP 124/70   Pulse 78   Temp (!) 96.3 F (35.7 C)   Resp 16   Ht 5' 10.5" (1.791 m)   Wt 217 lb 6.4 oz (98.6 kg)   SpO2 95%   BMI 30.75 kg/m   Appears well nourished, well groomed  and in no distress.  Eyes: PERRLA, EOMs, conjunctiva no swelling or erythema. Sinuses: No frontal/maxillary  tenderness ENT/Mouth: EAC's clear, TM's nl w/o erythema, bulging. Nares clear w/o erythema, swelling, exudates. Oropharynx clear without erythema or exudates. Oral hygiene is good. Tongue normal, non obstructing. Hearing intact.  Neck: Supple. Thyroid nl. Car 2+/2+ without bruits, nodes or JVD. Chest: Respirations nl with BS clear & equal w/o rales, rhonchi, wheezing or stridor.  Cor: Heart sounds normal w/ regular rate and rhythm without sig. murmurs, gallops, clicks or rubs. Peripheral pulses normal and equal  without edema.  Abdomen: Soft & bowel sounds normal. Non-tender w/o guarding, rebound, hernias, masses or organomegaly.  Lymphatics: Unremarkable.  Musculoskeletal: Full ROM all peripheral extremities, joint stability, 5/5 strength and normal gait.  Skin: Warm, dry without exposed rashes, lesions or ecchymosis apparent.  Neuro: Cranial nerves intact, reflexes equal bilaterally. Sensory-motor testing grossly  intact. Tendon reflexes grossly intact.  Pysch: Alert & oriented x 3.  Insight and judgement nl & appropriate. No ideations.  Assessment and Plan:  1. Seizures (H[facility])  - Levetiracetam level  2. AKI (acute kidney injury) ([place])  - COMPLETE METABOLIC PANEL WITH GFR  3. Essential hypertension  - Continue medication, monitor blood pressure at home.  - Continue DASH diet. Reminder to go to the ER if any CP,  SOB, nausea, dizziness, severe HA, changes vision/speech.  - CBC with Differential/Platelet - COMPLETE METABOLIC PANEL WITH GFR - Levetiracetam level  4. Hyperlipidemia associated with type 2 diabetes mellitus ([place])  - Continue diet/meds, exercise,& lifestyle modifications.  - Continue monitor periodic cholesterol/liver & renal functions    5. Type 2 diabetes mellitus with stage 3a chronic kidney  disease, with long-term current use of insulin ([facility])  - Continue diet, exercise, lifestyle modifications.  - Monitor appropriate labs.  - COMPLETE METABOLIC PANEL WITH GFR  6. Medication management  - CBC with Differential/Platelet - COMPLETE METABOLIC PANEL WITH GFR - Levetiracetam level        Discussed  regular exercise, BP monitoring, weight control to achieve/maintain BMI less than 25 and discussed meds and SE's. Recommended labs to assess and monitor clinical status with further disposition pending results of labs. Over 30 minutes of exam, counseling, chart review was performed.

## 2020-10-25 ENCOUNTER — Ambulatory Visit (INDEPENDENT_AMBULATORY_CARE_PROVIDER_SITE_OTHER): Payer: Medicare Other | Admitting: Internal Medicine

## 2020-10-25 ENCOUNTER — Other Ambulatory Visit: Payer: Self-pay | Admitting: Internal Medicine

## 2020-10-25 ENCOUNTER — Other Ambulatory Visit: Payer: Self-pay

## 2020-10-25 ENCOUNTER — Encounter: Payer: Self-pay | Admitting: Internal Medicine

## 2020-10-25 ENCOUNTER — Ambulatory Visit (INDEPENDENT_AMBULATORY_CARE_PROVIDER_SITE_OTHER): Payer: Medicare Other | Admitting: Neurology

## 2020-10-25 ENCOUNTER — Encounter: Payer: Self-pay | Admitting: Neurology

## 2020-10-25 VITALS — BP 144/83 | HR 73 | Ht 70.0 in | Wt 215.0 lb

## 2020-10-25 VITALS — BP 124/70 | HR 78 | Temp 96.3°F | Resp 16 | Ht 70.5 in | Wt 217.4 lb

## 2020-10-25 DIAGNOSIS — Z8603 Personal history of neoplasm of uncertain behavior: Secondary | ICD-10-CM

## 2020-10-25 DIAGNOSIS — Z9889 Other specified postprocedural states: Secondary | ICD-10-CM

## 2020-10-25 DIAGNOSIS — E1169 Type 2 diabetes mellitus with other specified complication: Secondary | ICD-10-CM

## 2020-10-25 DIAGNOSIS — E785 Hyperlipidemia, unspecified: Secondary | ICD-10-CM | POA: Diagnosis not present

## 2020-10-25 DIAGNOSIS — Z794 Long term (current) use of insulin: Secondary | ICD-10-CM | POA: Diagnosis not present

## 2020-10-25 DIAGNOSIS — N179 Acute kidney failure, unspecified: Secondary | ICD-10-CM | POA: Diagnosis not present

## 2020-10-25 DIAGNOSIS — N1831 Chronic kidney disease, stage 3a: Secondary | ICD-10-CM

## 2020-10-25 DIAGNOSIS — R569 Unspecified convulsions: Secondary | ICD-10-CM | POA: Diagnosis not present

## 2020-10-25 DIAGNOSIS — G40109 Localization-related (focal) (partial) symptomatic epilepsy and epileptic syndromes with simple partial seizures, not intractable, without status epilepticus: Secondary | ICD-10-CM | POA: Diagnosis not present

## 2020-10-25 DIAGNOSIS — G40909 Epilepsy, unspecified, not intractable, without status epilepticus: Secondary | ICD-10-CM

## 2020-10-25 DIAGNOSIS — I10 Essential (primary) hypertension: Secondary | ICD-10-CM

## 2020-10-25 DIAGNOSIS — E1122 Type 2 diabetes mellitus with diabetic chronic kidney disease: Secondary | ICD-10-CM

## 2020-10-25 DIAGNOSIS — Z79899 Other long term (current) drug therapy: Secondary | ICD-10-CM

## 2020-10-25 MED ORDER — VALSARTAN 160 MG PO TABS: ORAL_TABLET | ORAL | 1 refills | Status: DC

## 2020-10-25 MED ORDER — LEVETIRACETAM 500 MG PO TABS: 500.0000 mg | ORAL_TABLET | Freq: Two times a day (BID) | ORAL | 3 refills | Status: DC

## 2020-10-25 NOTE — Patient Instructions (Signed)
I had a long discussion with the patient with regards to his episode of partial seizure which likely represents symptomatic epilepsy from his meningioma resection and residual scar tissue in the brain.  He is at high risk for recurrence hence I recommend he continue Keppra and the current dose of 500 mg twice daily.  He is tolerating it well without side effects.  We discussed seizure provoking stimuli like sleep deprivation, medication noncompliance, irregular eating and sleeping habits and extremes of exertion and to avoid stimulants like alcohol, marijuana and substance abuse agents.  Patient was asked to limit his driving only to daytime hours and family routes and avoid night driving.  Check EEG to rule out any silent seizures.  He will return for follow-up in the future in 2 months with my nurse practitioner Janett Billow or call earlier if necessary.  Seizure, Adult A seizure is a sudden burst of abnormal electrical and chemical activity in the brain. Seizures usually last from 30 seconds to 2 minutes.  What are the causes? Common causes of this condition include:  Fever or infection.  Problems that affect the brain. These may include: ? A brain or head injury. ? Bleeding in the brain. ? A brain tumor.  Low levels of blood sugar or salt.  Kidney problems or liver problems.  Conditions that are passed from parent to child (are inherited).  Problems with a substance, such as: ? Having a reaction to a drug or a medicine. ? Stopping the use of a substance all of a sudden (withdrawal).  A stroke.  Disorders that affect how you develop. Sometimes, the cause may not be known.  What increases the risk?  Having someone in your family who has epilepsy. In this condition, seizures happen again and again over time. They have no clear cause.  Having had a tonic-clonic seizure before. This type of seizure causes you to: ? Tighten the muscles of the whole body. ? Lose consciousness.  Having had  a head injury or strokes before.  Having had a lack of oxygen at birth. What are the signs or symptoms? There are many types of seizures. The symptoms vary depending on the type of seizure you have. Symptoms during a seizure  Shaking that you cannot control (convulsions) with fast, jerky movements of muscles.  Stiffness of the body.  Breathing problems.  Feeling mixed up (confused).  Staring or not responding to sound or touch.  Head nodding.  Eyes that blink, flutter, or move fast.  Drooling, grunting, or making clicking sounds with your mouth  Losing control of when you pee or poop. Symptoms before a seizure  Feeling afraid, nervous, or worried.  Feeling like you may vomit.  Feeling like: ? You are moving when you are not. ? Things around you are moving when they are not.  Feeling like you saw or heard something before (dj vu).  Odd tastes or smells.  Changes in how you see. You may see flashing lights or spots. Symptoms after a seizure  Feeling confused.  Feeling sleepy.  Headache.  Sore muscles. How is this treated? If your seizure stops on its own, you will not need treatment. If your seizure lasts longer than 5 minutes, you will normally need treatment. Treatment may include:  Medicines given through an IV tube.  Avoiding things, such as medicines, that are known to cause your seizures.  Medicines to prevent seizures.  A device to prevent or control seizures.  Surgery.  A diet low in  carbohydrates and high in fat (ketogenic diet). Follow these instructions at home: Medicines  Take over-the-counter and prescription medicines only as told by your doctor.  Avoid foods or drinks that may keep your medicine from working, such as alcohol. Activity  Follow instructions about driving, swimming, or doing things that would be dangerous if you had another seizure. Wait until your doctor says it is safe for you to do these things.  If you live in  the [place] ask your local department of motor vehicles when you can drive.  Get a lot of rest. Teaching others  Teach friends and family what to do when you have a seizure. They should: ? Help you get down to the ground. ? Protect your head and body. ? Loosen any clothing around your neck. ? Turn you on your side. ? Know whether or not you need emergency care. ? Stay with you until you are better.  Also, tell them what not to do if you have a seizure. Tell them: ? They should not hold you down. ? They should not put anything in your mouth.   General instructions  Avoid anything that gives you seizures.  Keep a seizure diary. Write down: ? What you remember about each seizure. ? What you think caused each seizure.  Keep all follow-up visits. Contact a doctor if:  You have another seizure or seizures. Call the doctor each time you have a seizure.  The pattern of your seizures changes.  You keep having seizures with treatment.  You have symptoms of being sick or having an infection.  You are not able to take your medicine. Get help right away if:  You have any of these problems: ? A seizure that lasts longer than 5 minutes. ? Many seizures in a row and you do not feel better between seizures. ? A seizure that makes it harder to breathe. ? A seizure and you can no longer speak or use part of your body.  You do not wake up right after a seizure.  You get hurt during a seizure.  You feel confused or have pain right after a seizure. These symptoms may be an emergency. Get help right away. Call your local emergency services (911 in the [place]  Do not wait to see if the symptoms will go away.  Do not drive yourself to the hospital. Summary  A seizure is a sudden burst of abnormal electrical and chemical activity in the brain. Seizures normally last from 30 seconds to 2 minutes.  Causes of seizures include illness, injury to the head, low levels of blood sugar or salt,  and certain conditions.  Most seizures will stop on their own in less than 5 minutes. Seizures that last longer than 5 minutes are a medical emergency and need treatment right away.  Many medicines are used to treat seizures. Take over-the-counter and prescription medicines only as told by your doctor. This information is not intended to replace advice given to you by your health care provider. Make sure you discuss any questions you have with your health care provider. Document Revised: 02/13/2020 Document Reviewed: 02/13/2020 [employer] Patient Education  [employer].

## 2020-10-25 NOTE — Progress Notes (Signed)
[facility] [address] [place]. [place] [address] [phone]       OFFICE CONSULT NOTE  Mr. MACAULAY REICHER Date of Birth:  1945/12/14 Medical Record Number:  [id number]   Referring MD: Lesleigh Noe  Reason for Referral: Seizure  HPI: Mr. Mestre is a pleasant 75 year old Caucasian male seen today for initial office consultation visit for seizure.  History is obtained from the patient and review of electronic medical records and I personally reviewed pertinent imaging films in PACS.  He has past medical history of hypertension, hyperlipidemia, prediabetes, obstructive sleep apnea on CPAP, gout, anxiety who was recently diagnosed with large 7 x 2.9 x 3.1 cm right frontal convexity meningioma in May 2021.  At that time he presented with left leg pain and had a fall.  Routine brain imaging showed a large right frontal meningioma.  He underwent gross total excision of the meningioma on 12/29/2019 by Dr. Ellene Route and did well postoperatively.  He was on Decadron which was tapered he was also given Keppra 500 mg twice daily which he took for 6 months and seizure prophylaxis and overall tolerated very well though his wife had noticed mild personality change.  He did discontinue the Keppra.  He presented on 10/15/2020 with episode of involuntary movements on his jaw and difficulty controlling it after eating food.  This lasted only a couple of minutes.  Wife noticed that he was rhythmically chewing and could not stop it and was choking.  He had trouble speaking and try to speak but words did not come out.  He did not have any alteration of consciousness or any tongue bite.  There was no extremity tonic-clonic movements.  Patient had no obvious trigger for this episode.  He does have some habitual nocturia and wakes up every 2 3 hours but the patient states he does sleep 6 to 8 hours every night.  He has no prior history of head injury, seizures, stroke, TIA, headaches or any other  neurological problems.  There is no family history of epilepsy.  Patient has since been restarted on Keppra 500 mg twice daily which is tolerating well without any side effects.  He had an EEG done on 10/15/2020 which was also normal.  MRI scan of the brain done on 10/15/2020 showed no residual meningioma tumor with postoperative changes of craniotomy in the right frontal region.  No acute abnormality.  Overall no significant change compared to previous MRI from September 2021.  Patient denies drinking alcohol ,smoking marijuana or using any drugs of abuse.  ROS:   14 system review of systems is positive for facial twitching, difficulty speaking, seizure, leg pain all other systems negative  PMH:  Past Medical History:  Diagnosis Date   Allergy    Anxiety    Cataract    beginning stage 01/22/2019   Chronic kidney disease    enlarge bladder   Diabetes mellitus without complication (HCC)    GERD (gastroesophageal reflux disease)    Gout    Hyperlipidemia    Hypertension    OSA (obstructive sleep apnea)    Pre-diabetes    Seizures (HCC)    Sleep apnea    c pap    Social History:  Social History   Socioeconomic History   Marital status: Married    Spouse name: Not on file   Number of children: Not on file   Years of education: Not on file   Highest education level: Not on file  Occupational History  Occupation: Recruitment consultant    Comment: on STD 10/25/20  Tobacco Use   Smoking status: Never Smoker   Smokeless tobacco: Never Used  Vaping Use   Vaping Use: Never used  Substance and Sexual Activity   Alcohol use: No   Drug use: No   Sexual activity: Not on file  Other Topics Concern   Not on file  Social History Narrative   Lives with wife   Right handed   Drinks 3-4 cups caffeine daily   Social Determinants of Health   Financial Resource Strain: Not on file  Food Insecurity: Not on file  Transportation Needs: Not on file  Physical Activity: Not on  file  Stress: Not on file  Social Connections: Not on file  Intimate Partner Violence: Not on file    Medications:   Current Outpatient Medications on File Prior to Visit  Medication Sig Dispense Refill   acetaminophen (TYLENOL) 650 MG CR tablet Take 650 mg by mouth every 8 (eight) hours as needed for pain (or headaches).     Cholecalciferol (VITAMIN D3) 10000 units capsule Take 10,000 Units by mouth at bedtime.      citalopram (CELEXA) 20 MG tablet TAKE 1 TABLET BY MOUTH  DAILY FOR MOOD (Patient taking differently: Take 20 mg by mouth daily.) 90 tablet 3   fenofibrate 160 MG tablet Take      1 tablet      Daily       for Triglycerides (Blood Fats) (Patient taking differently: Take 160 mg by mouth daily.) 90 tablet 3   fexofenadine (ALLEGRA) 180 MG tablet Take 180 mg by mouth daily as needed for allergies or rhinitis.     fluticasone (FLONASE) 50 MCG/ACT nasal spray Place 2 sprays into both nostrils daily as needed for allergies or rhinitis.     glipiZIDE (GLUCOTROL) 5 MG tablet TAKE 1 TO 2 TABLETS BY  MOUTH 3 TIMES A DAY WITH  MEALS FOR DIABETES (Patient taking differently: Take 5-10 mg by mouth 3 (three) times daily with meals.) 540 tablet 3   glucose blood (FREESTYLE LITE) test strip Check blood sugar 1 time daily-[id number] 100 each 5   mupirocin ointment (BACTROBAN) 2 % APPLY TWICE A DAY TO SKIN INFECTION. (Patient taking differently: Apply 1 application topically 2 (two) times daily as needed (skin infection).) 66 g 1   omeprazole (PRILOSEC) 40 MG capsule Take     1 capsule     Daily      to Prevent Indigestion & Heartburn (Patient taking differently: Take 40 mg by mouth daily.) 90 capsule 3   polyethylene glycol (MIRALAX / GLYCOLAX) 17 g packet Take 17 g by mouth daily as needed for mild constipation. 14 each 0   pyridOXINE (B-6) 50 MG tablet Take 1 tablet (50 mg total) by mouth daily. 30 tablet 0   rosuvastatin (CRESTOR) 10 MG tablet TAKE 1 TABLET BY MOUTH  DAILY FOR  CHOLESTEROL (Patient taking differently: Take 10 mg by mouth daily. TAKE 1 TABLET BY MOUTH  DAILY FOR CHOLESTEROL) 90 tablet 3   No current facility-administered medications on file prior to visit.    Allergies:   Allergies  Allergen Reactions   Cardura [Doxazosin Mesylate] Other (See Comments)    Nasal congestion   Codeine Other (See Comments)    The patient passed out   Hytrin [Terazosin] Other (See Comments)    Reaction not recalled    Physical Exam General: Mildly obese elderly Caucasian male seated, in no evident distress  Head: head normocephalic and atraumatic.   Neck: supple with no carotid or supraclavicular bruits Cardiovascular: regular rate and rhythm, no murmurs Musculoskeletal: no deformity.  Right frontal craniotomy scar from meningioma surgery Skin:  no rash/petichiae Vascular:  Normal pulses all extremities  Neurologic Exam Mental Status: Awake and fully alert. Oriented to place and time. Recent and remote memory intact. Attention span, concentration and fund of knowledge appropriate. Mood and affect appropriate.  Cranial Nerves: Fundoscopic exam reveals sharp disc margins. Pupils equal, briskly reactive to light. Extraocular movements full without nystagmus. Visual fields full to confrontation. Hearing intact.  Mild left lower facial asymmetry.  Sensation intact. Face, tongue, palate moves normally and symmetrically.  Motor: Normal bulk and tone. Normal strength in all tested extremity muscles. Sensory.: intact to touch , pinprick , position and vibratory sensation.  Coordination: Rapid alternating movements normal in all extremities. Finger-to-nose and heel-to-shin performed accurately bilaterally. Gait and Station: Arises from chair without difficulty. Stance is normal. Gait demonstrates normal stride length and balance .  On able to heel, toe and tandem walk without difficulty.  Reflexes: 1+ and symmetric. Toes downgoing.       ASSESSMENT: 75 year old  Caucasian male with solitary episode of simple partial seizure in February 2022 likely symptomatic from his right frontal meningioma surgery from May 2021.     PLAN: I had a long discussion with the patient with regards to his episode of partial seizure which likely represents symptomatic epilepsy from his meningioma resection and residual scar tissue in the brain.  He is at high risk for recurrence hence I recommend he continue Keppra and the current dose of 500 mg twice daily.  He is tolerating it well without side effects.  We discussed seizure provoking stimuli like sleep deprivation, medication noncompliance, irregular eating and sleeping habits and extremes of exertion and to avoid stimulants like alcohol, marijuana and substance abuse agents.  Patient was asked to limit his driving only to daytime hours and family routes and avoid night driving.  Check EEG to rule out any silent seizures.  He will return for follow-up in the future in 2 months with my nurse practitioner Janett Billow or call earlier if necessary.  Greater than 50% time during this 45-minute consultation visit was spent on counseling and coordination of care about his seizures and risk of recurrence and answering questions Antony Contras, MD  Note: This document was prepared with digital dictation and possible smart phrase technology. Any transcriptional errors that result from this process are unintentional.

## 2020-10-26 NOTE — Progress Notes (Signed)
============================================================ -   Test results slightly outside the reference range are not unusual. If there is anything important, I will review this with you,  otherwise it is considered normal test values.  If you have further questions,  please do not hesitate to contact me at the office or via My Chart.  ============================================================ ============================================================  -  CBC is OK - WBC is Normal & Red Cell count is better  - slightly higher.  ============================================================ ============================================================  -  Chemistry panel shows blood sugar is 244 mg - too high - So you need to  work a lot harder on diet & weight loss so you won't have to go back on insulin   - Your kidney function is still in Stage 3b, So can't restart the Metformin   _ Keep your appointment with Dr Cruzita Lederer  to see what                                                      recommendations she has for your Diabetes ============================================================ ============================================================  -

## 2020-10-29 LAB — COMPLETE METABOLIC PANEL WITH GFR
AG Ratio: 1.9 (calc) (ref 1.0–2.5)
ALT: 15 U/L (ref 9–46)
AST: 23 U/L (ref 10–35)
Albumin: 4.3 g/dL (ref 3.6–5.1)
Alkaline phosphatase (APISO): 44 U/L (ref 35–144)
BUN/Creatinine Ratio: 16 (calc) (ref 6–22)
BUN: 28 mg/dL — ABNORMAL HIGH (ref 7–25)
CO2: 28 mmol/L (ref 20–32)
Calcium: 9.5 mg/dL (ref 8.6–10.3)
Chloride: 103 mmol/L (ref 98–110)
Creat: 1.8 mg/dL — ABNORMAL HIGH (ref 0.70–1.18)
GFR, Est African American: 42 mL/min/{1.73_m2} — ABNORMAL LOW (ref >=60)
GFR, Est Non African American: 37 mL/min/{1.73_m2} — ABNORMAL LOW (ref >=60)
Globulin: 2.3 g/dL (calc) (ref 1.9–3.7)
Glucose, Bld: 244 mg/dL — ABNORMAL HIGH (ref 65–99)
Potassium: 4.4 mmol/L (ref 3.5–5.3)
Sodium: 140 mmol/L (ref 135–146)
Total Bilirubin: 0.4 mg/dL (ref 0.2–1.2)
Total Protein: 6.6 g/dL (ref 6.1–8.1)

## 2020-10-29 LAB — CBC WITH DIFFERENTIAL/PLATELET
Absolute Monocytes: 553 cells/uL (ref 200–950)
Basophils Absolute: 49 cells/uL (ref 0–200)
Basophils Relative: 0.7 %
Eosinophils Absolute: 217 cells/uL (ref 15–500)
Eosinophils Relative: 3.1 %
HCT: 40.1 % (ref 38.5–50.0)
Hemoglobin: 13.6 g/dL (ref 13.2–17.1)
Lymphs Abs: 1610 cells/uL (ref 850–3900)
MCH: 29.1 pg (ref 27.0–33.0)
MCHC: 33.9 g/dL (ref 32.0–36.0)
MCV: 85.9 fL (ref 80.0–100.0)
MPV: 10.4 fL (ref 7.5–12.5)
Monocytes Relative: 7.9 %
Neutro Abs: 4571 cells/uL (ref 1500–7800)
Neutrophils Relative %: 65.3 %
Platelets: 204 10*3/uL (ref 140–400)
RBC: 4.67 10*6/uL (ref 4.20–5.80)
RDW: 13.2 % (ref 11.0–15.0)
Total Lymphocyte: 23 %
WBC: 7 10*3/uL (ref 3.8–10.8)

## 2020-10-29 LAB — LEVETIRACETAM LEVEL: Keppra (Levetiracetam): 18.2 ug/mL (ref 12.0–46.0)

## 2020-10-29 NOTE — Progress Notes (Signed)
============================================================ ============================================================ ============================================================ ============================================================  -    Keppra Level finally return & is at a good level, So . . . . . . . . . . Marland Kitchen                                                                               Please continue dose same  ============================================================ ============================================================ ============================================================ ============================================================

## 2020-11-17 ENCOUNTER — Telehealth: Payer: Self-pay | Admitting: Neurology

## 2020-11-17 ENCOUNTER — Other Ambulatory Visit: Payer: Medicare Other

## 2020-11-17 NOTE — Telephone Encounter (Signed)
Pt called, cancelled EEG, having diarrhea. Rescheduled appt.

## 2020-11-17 NOTE — Telephone Encounter (Signed)
I spoke with [facility]. They already have refills on file from Dr Leonie Man and were able to successfully process a through insurance.  They will fill it for the patient and notify him when its ready.

## 2020-11-17 NOTE — Telephone Encounter (Signed)
Pt request refill levETIRAcetam (KEPPRA) 500 MG tablet at [facility] #[id number]

## 2020-12-13 ENCOUNTER — Ambulatory Visit (INDEPENDENT_AMBULATORY_CARE_PROVIDER_SITE_OTHER): Payer: Medicare Other | Admitting: Neurology

## 2020-12-13 DIAGNOSIS — G40909 Epilepsy, unspecified, not intractable, without status epilepticus: Secondary | ICD-10-CM | POA: Diagnosis not present

## 2020-12-22 DIAGNOSIS — N3281 Overactive bladder: Secondary | ICD-10-CM | POA: Diagnosis not present

## 2020-12-23 ENCOUNTER — Ambulatory Visit: Payer: Medicare Other | Admitting: Neurology

## 2020-12-24 NOTE — Progress Notes (Signed)
Kindly inform the patient that EEG study was abnormal showing mild irritability of the brain on the right side which may increase predisposition for seizures however no definite seizure activity was noted

## 2020-12-27 ENCOUNTER — Telehealth: Payer: Self-pay | Admitting: Neurology

## 2020-12-27 NOTE — Telephone Encounter (Signed)
-----   Message from Garvin Fila, MD sent at 12/24/2020  9:54 AM EDT ----- Jeffery Reed inform the patient that EEG study was abnormal showing mild irritability of the brain on the right side which may increase predisposition for seizures however no definite seizure activity was noted

## 2020-12-27 NOTE — Telephone Encounter (Signed)
Called the patient and reviewed the EEG results with him. Pt is still on the medication for seizures and was advised to stay on that medication for now.

## 2020-12-29 ENCOUNTER — Ambulatory Visit (INDEPENDENT_AMBULATORY_CARE_PROVIDER_SITE_OTHER): Payer: Medicare Other | Admitting: Adult Health

## 2020-12-29 ENCOUNTER — Encounter: Payer: Self-pay | Admitting: Adult Health

## 2020-12-29 VITALS — BP 122/73 | HR 70 | Ht 70.0 in | Wt 219.0 lb

## 2020-12-29 DIAGNOSIS — G40909 Epilepsy, unspecified, not intractable, without status epilepticus: Secondary | ICD-10-CM

## 2020-12-29 DIAGNOSIS — Z8669 Personal history of other diseases of the nervous system and sense organs: Secondary | ICD-10-CM | POA: Diagnosis not present

## 2020-12-29 DIAGNOSIS — Z8603 Personal history of neoplasm of uncertain behavior: Secondary | ICD-10-CM | POA: Diagnosis not present

## 2020-12-29 DIAGNOSIS — Z9889 Other specified postprocedural states: Secondary | ICD-10-CM | POA: Diagnosis not present

## 2020-12-29 MED ORDER — LEVETIRACETAM 500 MG PO TABS: 500.0000 mg | ORAL_TABLET | Freq: Two times a day (BID) | ORAL | 3 refills | Status: DC

## 2020-12-29 NOTE — Patient Instructions (Addendum)
Your Plan:  Continue Keppra 500 mg twice daily for seizure prevention  Your recent EEG did show increased risk of seizures likely from your meningioma surgery therefore recommend continued use of seizure prevention medication at this time  Referral will be placed to our sleep department for further evaluation of sleep apnea     Follow-up in 6 months or call earlier if needed     Thank you for coming to see [place] at [facility]. I hope we have been able to provide you high quality care today.  You may receive a patient satisfaction survey over the next few weeks. We would appreciate your feedback and comments so that we may continue to improve ourselves and the health of our patients.

## 2020-12-29 NOTE — Progress Notes (Signed)
[facility] [address] [place]. [place] [address] [phone]       OFFICE FOLLOW UP NOTE  [name] Date of Birth:  1946/03/07 Medical Record Number:  [id number]   Referring MD: Lesleigh Noe  Reason for Referral: Seizure  Chief Complaint  Patient presents with  . Follow-up    Rm 14 alone Pt is well and stable, no new seizures       HPI:   Today, 12/29/2020, Jeffery Reed returns for seizure follow-up after initial consult visit with Dr. Leonie Man 2 months ago.  He is unaccompanied at today's visit.  EEG 12/13/2020 did show mild irritability which increases predisposition of seizures and recommended continuation of Keppra.  He has remained on Keppra tolerating without side effects.  Denies any additional seizure activity.  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  He does report prior history of sleep apnea over 10 years ago previously using CPAP but he stopped using after weight loss.  He denies any witnessed apnea but does report snoring, occasional fatigue and nocturia.  He questions possible need of repeat sleep study.    History provided for reference purposes only Consult visit 10/25/2020 Dr. Leonie Man: Jeffery Reed is a pleasant 75 year old Caucasian male seen today for initial office consultation visit for seizure.  History is obtained from the patient and review of electronic medical records and I personally reviewed pertinent imaging films in PACS.  He has past medical history of hypertension, hyperlipidemia, prediabetes, obstructive sleep apnea on CPAP, gout, anxiety who was recently diagnosed with large 7 x 2.9 x 3.1 cm right frontal convexity meningioma in May 2021.  At that time he presented with left leg pain and had a fall.  Routine brain imaging showed a large right frontal meningioma.  He underwent gross total excision of the meningioma on 12/29/2019 by Dr.  Ellene Route and did well postoperatively.  He was on Decadron which was tapered he was also given Keppra 500 mg twice daily which he took for 6 months and seizure prophylaxis and overall tolerated very well though his wife had noticed mild personality change.  He did discontinue the Keppra.  He presented on 10/15/2020 with episode of involuntary movements on his jaw and difficulty controlling it after eating food.  This lasted only a couple of minutes.  Wife noticed that he was rhythmically chewing and could not stop it and was choking.  He had trouble speaking and try to speak but words did not come out.  He did not have any alteration of consciousness or any tongue bite.  There was no extremity tonic-clonic movements.  Patient had no obvious trigger for this episode.  He does have some habitual nocturia and wakes up every 2 3 hours but the patient states he does sleep 6 to 8 hours every night.  He has no prior history of head injury, seizures, stroke, TIA, headaches or any other neurological problems.  There is no family history of epilepsy.  Patient has since been restarted on Keppra 500 mg twice daily which is tolerating well without any side effects.  He had an EEG done on 10/15/2020 which was also normal.  MRI scan of the brain done on 10/15/2020 showed no residual meningioma tumor with postoperative changes of craniotomy in the right frontal region.  No acute abnormality.  Overall no significant change compared to previous MRI from September 2021.  Patient denies drinking alcohol ,smoking  marijuana or using any drugs of abuse.  ROS:   14 system review of systems is positive for those listed in HPI and all other systems negative  PMH:  Past Medical History:  Diagnosis Date  . Allergy   . Anxiety   . Cataract    beginning stage 01/22/2019  . Chronic kidney disease    enlarge bladder  . Diabetes mellitus without complication ([place])   . GERD (gastroesophageal reflux disease)   . Gout   . Hyperlipidemia   .  Hypertension   . OSA (obstructive sleep apnea)   . Pre-diabetes   . Seizures ([place])   . Sleep apnea    c pap    Social History:  Social History   Socioeconomic History  . Marital status: Married    Spouse name: Not on file  . Number of children: Not on file  . Years of education: Not on file  . Highest education level: Not on file  Occupational History  . Occupation: Recruitment consultant    Comment: on STD 10/25/20  Tobacco Use  . Smoking status: Never Smoker  . Smokeless tobacco: Never Used  Vaping Use  . Vaping Use: Never used  Substance and Sexual Activity  . Alcohol use: No  . Drug use: No  . Sexual activity: Not on file  Other Topics Concern  . Not on file  Social History Narrative   Lives with wife   Right handed   Drinks 3-4 cups caffeine daily   Social Determinants of Health   Financial Resource Strain: Not on file  Food Insecurity: Not on file  Transportation Needs: Not on file  Physical Activity: Not on file  Stress: Not on file  Social Connections: Not on file  Intimate Partner Violence: Not on file    Medications:   Current Outpatient Medications on File Prior to Visit  Medication Sig Dispense Refill  . acetaminophen (TYLENOL) 650 MG CR tablet Take 650 mg by mouth every 8 (eight) hours as needed for pain (or headaches).    . Cholecalciferol (VITAMIN D3) 10000 units capsule Take 10,000 Units by mouth at bedtime.     . citalopram (CELEXA) 20 MG tablet TAKE 1 TABLET BY MOUTH  DAILY FOR MOOD (Patient taking differently: Take 20 mg by mouth daily.) 90 tablet 3  . fenofibrate 160 MG tablet Take      1 tablet      Daily       for Triglycerides (Blood Fats) (Patient taking differently: Take 160 mg by mouth daily.) 90 tablet 3  . fexofenadine (ALLEGRA) 180 MG tablet Take 180 mg by mouth daily as needed for allergies or rhinitis.    . fluticasone (FLONASE) 50 MCG/ACT nasal spray Place 2 sprays into both nostrils daily as needed for allergies or rhinitis.    Marland Kitchen glipiZIDE  (GLUCOTROL) 5 MG tablet TAKE 1 TO 2 TABLETS BY  MOUTH 3 TIMES A DAY WITH  MEALS FOR DIABETES (Patient taking differently: Take 5-10 mg by mouth 3 (three) times daily with meals.) 540 tablet 3  . glucose blood (FREESTYLE LITE) test strip Check blood sugar 1 time daily-DX-E11.22 100 each 5  . levETIRAcetam (KEPPRA) 500 MG tablet Take 1 tablet (500 mg total) by mouth 2 (two) times daily. 60 tablet 3  . mupirocin ointment (BACTROBAN) 2 % APPLY TWICE A DAY TO SKIN INFECTION. (Patient taking differently: Apply 1 application topically 2 (two) times daily as needed (skin infection).) 66 g 1  . omeprazole (PRILOSEC) 40  MG capsule Take     1 capsule     Daily      to Prevent Indigestion & Heartburn (Patient taking differently: Take 40 mg by mouth daily.) 90 capsule 3  . polyethylene glycol (MIRALAX / GLYCOLAX) 17 g packet Take 17 g by mouth daily as needed for mild constipation. 14 each 0  . pyridOXINE (B-6) 50 MG tablet Take 1 tablet (50 mg total) by mouth daily. 30 tablet 0  . rosuvastatin (CRESTOR) 10 MG tablet TAKE 1 TABLET BY MOUTH  DAILY FOR CHOLESTEROL (Patient taking differently: Take 10 mg by mouth daily. TAKE 1 TABLET BY MOUTH  DAILY FOR CHOLESTEROL) 90 tablet 3  . valsartan (DIOVAN) 160 MG tablet Take  1 tablet  Daily  for BP & Diabetic Kidney Protection 90 tablet 1   No current facility-administered medications on file prior to visit.    Allergies:   Allergies  Allergen Reactions  . Cardura [Doxazosin Mesylate] Other (See Comments)    Nasal congestion  . Codeine Other (See Comments)    The patient passed out  . Hytrin [Terazosin] Other (See Comments)    Reaction not recalled    Physical Exam Today's Vitals   12/29/20 0846  BP: 122/73  Pulse: 70  Weight: 219 lb (99.3 kg)  Height: 5\' 10"  (1.778 m)   Body mass index is 31.42 kg/m.   General: Mildly obese pleasant elderly Caucasian male seated, in no evident distress Head: head normocephalic and atraumatic.   Neck: supple with no  carotid or supraclavicular bruits Cardiovascular: regular rate and rhythm, no murmurs Musculoskeletal: no deformity.  Right frontal craniotomy scar from meningioma surgery Skin:  no rash/petichiae Vascular:  Normal pulses all extremities  Neurologic Exam Mental Status: Awake and fully alert. Oriented to place and time. Recent and remote memory intact. Attention span, concentration and fund of knowledge appropriate. Mood and affect appropriate.  Cranial Nerves: Pupils equal, briskly reactive to light. Extraocular movements full without nystagmus. Visual fields full to confrontation. Hearing intact.  Mild left lower facial asymmetry.  Sensation intact. Face, tongue, palate moves normally and symmetrically.  Motor: Normal bulk and tone. Normal strength in all tested extremity muscles. Sensory.: intact to touch , pinprick , position and vibratory sensation.  Coordination: Rapid alternating movements normal in all extremities. Finger-to-nose and heel-to-shin performed accurately bilaterally. Gait and Station: Arises from chair without difficulty. Stance is normal. Gait demonstrates normal stride length and balance without use of assistive device.  Able to heel, toe and tandem walk without difficulty.  Reflexes: 1+ and symmetric. Toes downgoing.       ASSESSMENT: 75 year old Caucasian male with solitary episode of simple partial seizure in February 2022 likely symptomatic from his right frontal meningioma surgery from May 2021.  EEG 01/12/2021 abnormal with mild irritability of the brain on the right side which may increase predisposition for seizures however no definite seizure activity noted. Also notes remote hx of sleep apnea no longer on CPAP     PLAN:  Simple partial seizure -Continue Keppra 500 mg twice daily for seizure prophylaxis - refill provided - discussed seizure provoking stimuli like sleep deprivation, medication noncompliance, irregular eating and sleeping habits and extremes of  exertion and to avoid stimulants like alcohol, marijuana and substance abuse agents.  Per Dr. Leonie Man, patient reminded to limit his driving only to daytime hours and family routes and avoid night driving.  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 Leonie Man but would not  recommend until at least 6 months post seizure activity  Hx of sleep apnea -reports CPAP use >10 years ago -Referral placed to [place] [facility]   Follow-up in 6 months or call earlier if needed   CC:  [place] provider: Dr. Armanda Magic, Gwyndolyn Saxon, MD   I spent 33 minutes of face-to-face and non-face-to-face time with patient.  This included previsit chart review as well as extensive conversation regarding seizures likely from right frontal meningioma surgery and ongoing use of AED for seizure prevention, seizure triggers as well as precautions and potential return to driving, remote history of sleep apnea with prior use of CPAP and importance of sleep apnea treatment if still present and answered all other questions to patient's satisfaction   Frann Rider, AGNP-BC  [facility] [address] [place],  [address]  Phone [phone] Fax [phone] Note: This document was prepared with digital dictation and possible smart phrase technology. Any transcriptional errors that result from this process are unintentional.

## 2020-12-30 DIAGNOSIS — E119 Type 2 diabetes mellitus without complications: Secondary | ICD-10-CM | POA: Diagnosis not present

## 2020-12-30 DIAGNOSIS — H2513 Age-related nuclear cataract, bilateral: Secondary | ICD-10-CM | POA: Diagnosis not present

## 2020-12-30 DIAGNOSIS — H52223 Regular astigmatism, bilateral: Secondary | ICD-10-CM | POA: Diagnosis not present

## 2020-12-30 DIAGNOSIS — H25013 Cortical age-related cataract, bilateral: Secondary | ICD-10-CM | POA: Diagnosis not present

## 2020-12-30 DIAGNOSIS — H524 Presbyopia: Secondary | ICD-10-CM | POA: Diagnosis not present

## 2020-12-30 LAB — HM DIABETES EYE EXAM

## 2020-12-30 NOTE — Progress Notes (Signed)
I agree with the above plan 

## 2021-01-12 ENCOUNTER — Encounter: Payer: Self-pay | Admitting: *Deleted

## 2021-01-19 ENCOUNTER — Encounter: Payer: Self-pay | Admitting: Internal Medicine

## 2021-01-19 ENCOUNTER — Other Ambulatory Visit: Payer: Self-pay

## 2021-01-19 ENCOUNTER — Ambulatory Visit (INDEPENDENT_AMBULATORY_CARE_PROVIDER_SITE_OTHER): Payer: Medicare Other | Admitting: Internal Medicine

## 2021-01-19 VITALS — BP 130/82 | HR 61 | Ht 70.0 in | Wt 216.6 lb

## 2021-01-19 DIAGNOSIS — N1832 Chronic kidney disease, stage 3b: Secondary | ICD-10-CM

## 2021-01-19 DIAGNOSIS — E1122 Type 2 diabetes mellitus with diabetic chronic kidney disease: Secondary | ICD-10-CM

## 2021-01-19 DIAGNOSIS — E782 Mixed hyperlipidemia: Secondary | ICD-10-CM | POA: Diagnosis not present

## 2021-01-19 DIAGNOSIS — N183 Chronic kidney disease, stage 3 unspecified: Secondary | ICD-10-CM

## 2021-01-19 LAB — POCT GLYCOSYLATED HEMOGLOBIN (HGB A1C): Hemoglobin A1C: 6.7 % — AB (ref 4.0–5.6)

## 2021-01-19 MED ORDER — TRULICITY 0.75 MG/0.5ML ~~LOC~~ SOAJ: SUBCUTANEOUS | 1 refills | Status: DC

## 2021-01-19 MED ORDER — GLIPIZIDE 5 MG PO TABS: 5.0000 mg | ORAL_TABLET | Freq: Two times a day (BID) | ORAL | 3 refills | Status: DC

## 2021-01-19 MED ORDER — METFORMIN HCL 500 MG PO TABS: 500.0000 mg | ORAL_TABLET | Freq: Every day | ORAL | 3 refills | Status: DC

## 2021-01-19 NOTE — Patient Instructions (Signed)
Please start:  - Metformin 500 mg with dinner  Use: - Glipizide 5-10 mg before b'fast and dinner  Please start: - Trulicity 1.27 mg weekly. Let me know when you are close to running out to call in the higher dose to your pharmacy (1.5 mg).  Please return in 3 months with your sugar log.

## 2021-01-19 NOTE — Progress Notes (Signed)
Patient ID: Jeffery Reed, male   DOB: Oct 20, 1945, 75 y.o.   MRN: [id number]   This visit occurred during the SARS-CoV-2 public health emergency.  Safety protocols were in place, including screening questions prior to the visit, additional usage of staff PPE, and extensive cleaning of exam room while observing appropriate contact time as indicated for disinfecting solutions.   HPI: Jeffery Reed is a 75 y.o.-year-old male, initially referred by his PCP, Unk Pinto, MD, returning for follow-up for DM2, dx in 2018, insulin-dependent since 12/2019, uncontrolled, with complications (CKD stage III, diabetic retinopathy).  Last visit 4 months ago.  Interim history: Patient has a history of meningioma surgery in 2021.  He recovered well afterwards.  However, since last visit, he had a seizure.  He is now on Keppra. He has increased urination. He is seeing a urologist. Otherwise, no blurry vision, nausea, chest pain.  Reviewed HbA1c levels: Lab Results  Component Value Date   HGBA1C 5.7 (A) 09/20/2020   HGBA1C 5.5 05/20/2020   HGBA1C 6.3 (H) 03/02/2020   HGBA1C 8.1 (H) 12/29/2019   HGBA1C 7.5 (H) 09/18/2019   HGBA1C 9.4 (H) 06/18/2019   HGBA1C 8.8 (H) 03/17/2019   HGBA1C 9.4 (H) 12/12/2018   HGBA1C 7.8 (H) 09/12/2018   HGBA1C 5.9 (H) 06/03/2018   Pt is on a regimen of: - Metformin ER 1000 >> 500 mg >> 1000 mg 2x a day, with meals >> now off Metformin since end of 09/2020 - Glipizide 05-25-09 >> 10-10 >> 05-25-09 mg before meals -  >> stopped 04/2020  Pt checks his sugars once a day: - am: 95-154, 190 >> 65-103  >> 68, 80-106 >> 97-169, 175, 198 - 2h after b'fast:  100-153, 166 >> 131, 136, 172 >> n/c >> 100, 148, 243 - before lunch: 82-151, 184 >> 158, 177, 199 (Christmas) >> n/c - 2h after lunch: 202 >> n/c >> 118 >> 125, 127 >> n/c - before dinner: n/c >> 103, 111 >> n/c >> 126, 301 - 2h after dinner: n/c >> 234 >> n/c - bedtime: n/c >> 92, 230 >> n/c - nighttime:  n/c Lowest sugar was 95 >> 65 >> 68 >> 97; he has hypoglycemia awareness at 100. Highest sugar was 220 >> 230 >> 234 >> 301.  Glucometer: Freestyle  Pt's meals are: - Breakfast:  Instant oatmeal or Jimmy Dean sausage bisuit or scrambled eggs - Lunch: tomato sandwich, ham - Dinner: larger meal: potatoes; spaghetti, lasagna, chicken, veggies: green beans, corn, broccoli - Snacks:potato chips, apples Diet sodas, unsweet tea,   -+ CKD stage 3b-sees nephrology, last BUN/creatinine:  Lab Results  Component Value Date   BUN 28 (H) 10/25/2020   BUN 23 10/16/2020   CREATININE 1.80 (H) 10/25/2020   CREATININE 1.78 (H) 10/16/2020  On valsartan 320.  -+ HL; last set of lipids: Lab Results  Component Value Date   CHOL 168 09/23/2020   HDL 31 (L) 09/23/2020   LDLCALC 109 (H) 09/23/2020   TRIG 161 (H) 09/23/2020   CHOLHDL 5.4 (H) 09/23/2020  On Crestor 10, fenofibrate 160.  - last eye exam was on 12/30/2020:+ DR, + small cataract.   -+ Mild, occasional numbness and tingling in his feet.  Pt has no FH of DM.  He also has HTN, OSA.  He is a retired Airline pilot.  During the winter, he drives a schoolbus.   ROS: Constitutional: no weight gain/no weight loss, no fatigue, no subjective hyperthermia, no subjective hypothermia Eyes: no blurry vision,  no xerophthalmia ENT: no sore throat, no nodules palpated in neck, no dysphagia, no odynophagia, no hoarseness Cardiovascular: no CP/no SOB/no palpitations/no leg swelling Respiratory: no cough/no SOB/no wheezing Gastrointestinal: no N/no V/no D/no C/no acid reflux Musculoskeletal: no muscle aches/no joint aches Skin: no rashes, no hair loss Neurological: no tremors/no numbness/no tingling/no dizziness  I reviewed pt's medications, allergies, PMH, social hx, family hx, and changes were documented in the history of present illness. Otherwise, unchanged from my initial visit note.  Past Medical History:  Diagnosis Date  . Allergy   .  Anxiety   . Cataract    beginning stage 01/22/2019  . Chronic kidney disease    enlarge bladder  . Diabetes mellitus without complication ([place])   . GERD (gastroesophageal reflux disease)   . Gout   . Hyperlipidemia   . Hypertension   . OSA (obstructive sleep apnea)   . Pre-diabetes   . Seizures ([place])   . Sleep apnea    c pap   Past Surgical History:  Procedure Laterality Date  . COLONOSCOPY    . CRANIOTOMY Right 12/29/2019   Procedure: Craniotomy for Tumor;  Surgeon: Kristeen Miss, MD;  Location: [place];  Service: Neurosurgery;  Laterality: Right;  Craniotomy for Tumor Excision  . FOOT SURGERY  10/2009  . MYRINGOTOMY Left 02-2005   Dr.Newman  . TONSILLECTOMY  1953   Social History   Socioeconomic History  . Marital status: Married    Spouse name: Not on file  . Number of children: Not on file  . Years of education: Not on file  . Highest education level: Not on file  Occupational History  . Occupation: Recruitment consultant    Comment: on STD 10/25/20  Tobacco Use  . Smoking status: Never Smoker  . Smokeless tobacco: Never Used  Vaping Use  . Vaping Use: Never used  Substance and Sexual Activity  . Alcohol use: No  . Drug use: No  . Sexual activity: Not on file  Other Topics Concern  . Not on file  Social History Narrative   Lives with wife   Right handed   Drinks 3-4 cups caffeine daily   Social Determinants of Health   Financial Resource Strain: Not on file  Food Insecurity: Not on file  Transportation Needs: Not on file  Physical Activity: Not on file  Stress: Not on file  Social Connections: Not on file  Intimate Partner Violence: Not on file   Current Outpatient Medications on File Prior to Visit  Medication Sig Dispense Refill  . acetaminophen (TYLENOL) 650 MG CR tablet Take 650 mg by mouth every 8 (eight) hours as needed for pain (or headaches).    . Cholecalciferol (VITAMIN D3) 10000 units capsule Take 10,000 Units by mouth at bedtime.     . citalopram (CELEXA)  20 MG tablet TAKE 1 TABLET BY MOUTH  DAILY FOR MOOD (Patient taking differently: Take 20 mg by mouth daily.) 90 tablet 3  . fenofibrate 160 MG tablet Take      1 tablet      Daily       for Triglycerides (Blood Fats) (Patient taking differently: Take 160 mg by mouth daily.) 90 tablet 3  . fexofenadine (ALLEGRA) 180 MG tablet Take 180 mg by mouth daily as needed for allergies or rhinitis.    . fluticasone (FLONASE) 50 MCG/ACT nasal spray Place 2 sprays into both nostrils daily as needed for allergies or rhinitis.    Marland Kitchen glipiZIDE (GLUCOTROL) 5 MG tablet TAKE  1 TO 2 TABLETS BY  MOUTH 3 TIMES A DAY WITH  MEALS FOR DIABETES (Patient taking differently: Take 5-10 mg by mouth 3 (three) times daily with meals.) 540 tablet 3  . glucose blood (FREESTYLE LITE) test strip Check blood sugar 1 time daily-DX-E11.22 100 each 5  . levETIRAcetam (KEPPRA) 500 MG tablet Take 1 tablet (500 mg total) by mouth 2 (two) times daily. 180 tablet 3  . mupirocin ointment (BACTROBAN) 2 % APPLY TWICE A DAY TO SKIN INFECTION. (Patient taking differently: Apply 1 application topically 2 (two) times daily as needed (skin infection).) 66 g 1  . omeprazole (PRILOSEC) 40 MG capsule Take     1 capsule     Daily      to Prevent Indigestion & Heartburn (Patient taking differently: Take 40 mg by mouth daily.) 90 capsule 3  . polyethylene glycol (MIRALAX / GLYCOLAX) 17 g packet Take 17 g by mouth daily as needed for mild constipation. 14 each 0  . pyridOXINE (B-6) 50 MG tablet Take 1 tablet (50 mg total) by mouth daily. 30 tablet 0  . rosuvastatin (CRESTOR) 10 MG tablet TAKE 1 TABLET BY MOUTH  DAILY FOR CHOLESTEROL (Patient taking differently: Take 10 mg by mouth daily. TAKE 1 TABLET BY MOUTH  DAILY FOR CHOLESTEROL) 90 tablet 3  . valsartan (DIOVAN) 160 MG tablet Take  1 tablet  Daily  for BP & Diabetic Kidney Protection 90 tablet 1   No current facility-administered medications on file prior to visit.   Allergies  Allergen Reactions  .  Cardura [Doxazosin Mesylate] Other (See Comments)    Nasal congestion  . Codeine Other (See Comments)    The patient passed out  . Hytrin [Terazosin] Other (See Comments)    Reaction not recalled   Family History  Problem Relation Age of Onset  . Hypertension Mother   . GER disease Mother   . Hyperlipidemia Father   . Hypertension Father   . Parkinson's disease Father   . Hypertension Brother   . Diabetes Maternal Grandmother   . Stroke Paternal Grandmother   . Diabetes Paternal Grandmother   . Stroke Paternal Grandfather   . Colon cancer Neg Hx   . Colon polyps Neg Hx   . Esophageal cancer Neg Hx   . Stomach cancer Neg Hx   . Rectal cancer Neg Hx    PE: BP 130/82 (BP Location: Left Arm, Patient Position: Sitting, Cuff Size: Normal)   Pulse 61   Ht 5\' 10"  (1.778 m)   Wt 216 lb 9.6 oz (98.2 kg)   SpO2 96%   BMI 31.08 kg/m  Wt Readings from Last 3 Encounters:  01/19/21 216 lb 9.6 oz (98.2 kg)  12/29/20 219 lb (99.3 kg)  10/25/20 217 lb 6.4 oz (98.6 kg)   Constitutional: overweight, in NAD, + shuffling gait Eyes: PERRLA, EOMI, no exophthalmos ENT: moist mucous membranes, no thyromegaly, no cervical lymphadenopathy Cardiovascular: RRR, No MRG Respiratory: CTA B Gastrointestinal: abdomen soft, NT, ND, BS+ Musculoskeletal: no deformities, strength intact in all 4 Skin: moist, warm, no rashes Neurological: + mild tremor with outstretched R hand, DTR normal in all 4  ASSESSMENT: 1. DM2, insulin-dependent, previously uncontrolled, with long-term complications - CKD stage 3 - DR  2.  Hyperlipidemia  3. Obesity class I  PLAN:  1. Patient with longstanding, type 2 diabetes, previously on insulin, now on metformin and sulfonylurea after stopping basal insulin.  HbA1c increased very slightly, to 5.7% after stopping insulin.  Before  last visit, he was not checking sugars consistently but whenever he was checking during the holidays, they were slightly higher.  This started  to improve after the holidays, in 08/2020) and they were mostly at goal at last visit.  We did discuss about taking half of her glipizide dose if she plans to be active after a meal. -At today's visit, sugars are higher as his metformin was held approximately 3 months ago, likely due to the decrease in GFR.  Latest GFR from 10/25/2020 was 37. -He is mostly checking sugars in the morning with only occasional checks later in the day.  Approximately 50% of his sugars checked later in the day are at goal, while 50% are high, even at 301 yesterday after eating 2 hotdogs.  We discussed that hot dogs can definitely increase the blood sugars. -He did increase his glipizide since last visit by adding a 5 mg tablet before lunch and continuing 2 tablets before breakfast and 2 tablets before dinner. -At this visit, we discussed about options for treatment.  We will add 1 tablet of 500 mg of metformin with dinner and we can also add a GLP-1 receptor agonist, if covered by insurance.  Since Trulicity appears to be covered for him, we will start with 0.75 mg weekly for 4 weeks and then attempt to increase the dose if tolerated well.  Discussed about benefits and possible side effects.  I also advised him to start decreasing the glipizide after sugars started to improve with Trulicity.  My goal would be to have him off glipizide in the future if possible. - I suggested to:  Patient Instructions  Please start:  - Metformin 500 mg with dinner  Use: - Glipizide 5-10 mg before b'fast and dinner  Please start: - Trulicity 7.74 mg weekly. Let me know when you are close to running out to call in the higher dose to your pharmacy (1.5 mg).  Please return in 3 months with your sugar log.   - we checked his HbA1c: 6.7% (higher) - advised to check sugars at different times of the day - 1x a day, rotating check times - advised for yearly eye exams >> he is UTD - return to clinic in 3 months  2. HL -Regulated lipid panel  from 09/2020: LDL above target, HDL low: Lab Results  Component Value Date   CHOL 168 09/23/2020   HDL 31 (L) 09/23/2020   LDLCALC 109 (H) 09/23/2020   TRIG 161 (H) 09/23/2020   CHOLHDL 5.4 (H) 09/23/2020  -Continues Crestor 10 mg daily, fenofibrate 160 mg daily, without side effects  3.  Obesity class I -We were able to stop Lantus last year, which was weight inducing -Before last visit he gained 6 pounds due to the holidays -He gained 1 pound since last visit   Philemon Kingdom, MD PhD [facility]

## 2021-01-25 ENCOUNTER — Other Ambulatory Visit: Payer: Self-pay | Admitting: Internal Medicine

## 2021-01-25 DIAGNOSIS — E782 Mixed hyperlipidemia: Secondary | ICD-10-CM

## 2021-01-25 MED ORDER — ROSUVASTATIN CALCIUM 10 MG PO TABS
ORAL_TABLET | ORAL | 3 refills | Status: DC
Start: 2021-01-25 — End: 2021-10-06

## 2021-02-02 DIAGNOSIS — N401 Enlarged prostate with lower urinary tract symptoms: Secondary | ICD-10-CM | POA: Diagnosis not present

## 2021-02-02 DIAGNOSIS — R3915 Urgency of urination: Secondary | ICD-10-CM | POA: Diagnosis not present

## 2021-02-02 DIAGNOSIS — R351 Nocturia: Secondary | ICD-10-CM | POA: Diagnosis not present

## 2021-02-11 ENCOUNTER — Other Ambulatory Visit: Payer: Self-pay | Admitting: Internal Medicine

## 2021-02-11 DIAGNOSIS — N183 Chronic kidney disease, stage 3 unspecified: Secondary | ICD-10-CM

## 2021-03-03 ENCOUNTER — Other Ambulatory Visit: Payer: Self-pay

## 2021-03-03 ENCOUNTER — Ambulatory Visit (INDEPENDENT_AMBULATORY_CARE_PROVIDER_SITE_OTHER): Payer: Medicare Other | Admitting: Internal Medicine

## 2021-03-03 VITALS — BP 118/78 | HR 77 | Temp 96.8°F | Resp 16 | Ht 70.0 in | Wt 218.8 lb

## 2021-03-03 DIAGNOSIS — Z1212 Encounter for screening for malignant neoplasm of rectum: Secondary | ICD-10-CM

## 2021-03-03 DIAGNOSIS — Z79899 Other long term (current) drug therapy: Secondary | ICD-10-CM | POA: Diagnosis not present

## 2021-03-03 DIAGNOSIS — Z8249 Family history of ischemic heart disease and other diseases of the circulatory system: Secondary | ICD-10-CM | POA: Diagnosis not present

## 2021-03-03 DIAGNOSIS — E785 Hyperlipidemia, unspecified: Secondary | ICD-10-CM | POA: Diagnosis not present

## 2021-03-03 DIAGNOSIS — Z136 Encounter for screening for cardiovascular disorders: Secondary | ICD-10-CM

## 2021-03-03 DIAGNOSIS — G4733 Obstructive sleep apnea (adult) (pediatric): Secondary | ICD-10-CM

## 2021-03-03 DIAGNOSIS — E1169 Type 2 diabetes mellitus with other specified complication: Secondary | ICD-10-CM | POA: Diagnosis not present

## 2021-03-03 DIAGNOSIS — Z125 Encounter for screening for malignant neoplasm of prostate: Secondary | ICD-10-CM

## 2021-03-03 DIAGNOSIS — E1122 Type 2 diabetes mellitus with diabetic chronic kidney disease: Secondary | ICD-10-CM

## 2021-03-03 DIAGNOSIS — Z1211 Encounter for screening for malignant neoplasm of colon: Secondary | ICD-10-CM

## 2021-03-03 DIAGNOSIS — Z9989 Dependence on other enabling machines and devices: Secondary | ICD-10-CM

## 2021-03-03 DIAGNOSIS — R569 Unspecified convulsions: Secondary | ICD-10-CM

## 2021-03-03 DIAGNOSIS — I10 Essential (primary) hypertension: Secondary | ICD-10-CM

## 2021-03-03 DIAGNOSIS — N1831 Chronic kidney disease, stage 3a: Secondary | ICD-10-CM

## 2021-03-03 DIAGNOSIS — E559 Vitamin D deficiency, unspecified: Secondary | ICD-10-CM | POA: Diagnosis not present

## 2021-03-03 NOTE — Patient Instructions (Signed)

## 2021-03-03 NOTE — Progress Notes (Signed)
Comprehensive Evaluation & Examination  Future Appointments  Date Time Provider [place]  03/03/2021  3:00 PM Unk Pinto, MD [facility] None  03/08/2021  9:00 AM Dohmeier, Asencion Partridge, MD [facility] None  04/21/2021 10:20 AM Philemon Kingdom, MD [facility] None  06/13/2021 11:30 AM Liane Comber, NP [facility] None  07/06/2021  8:45 AM Frann Rider, NP [facility] None            This very nice 75 y.o. MWM presents for a comprehensive evaluation and management of multiple medical co-morbidities.  Patient has been followed for HTN, HLD, T2_NIDDM  Prediabetes and Vitamin D Deficiency.                                                     In May 2021 patient underwent craniotomy for meningoma resection by Dr Ellene Route and post-op on Decadron his diabetes flared requiring insulin & patient was referred to Dr Cruzita Lederer for management.  Patient has since tapered off insulin to Metformin, Glipizide and Trulicity.         HTN predates since 62. Patient's BP has been controlled at home.  Today's BP is at goal - 118/78. Patient denies any cardiac symptoms as chest pain, palpitations, shortness of breath, dizziness or ankle swelling.       Patient's hyperlipidemia is controlled with diet and medications. Patient denies myalgias or other medication SE's. Last lipids were not at goal:  Lab Results  Component Value Date   CHOL 168 09/23/2020   HDL 31 (L) 09/23/2020   LDLCALC 109 (H) 09/23/2020   TRIG 161 (H) 09/23/2020   CHOLHDL 5.4 (H) 09/23/2020         Patient has hx/o T2_NIDDM since    and patient denies reactive hypoglycemic symptoms, visual blurring, diabetic polys or paresthesias. Last A1c was not at goal:   Lab Results  Component Value Date   HGBA1C 6.7 (A) 01/19/2021          Finally, patient has history of Vitamin D Deficiency of    and last vitamin D was   Lab Results  Component Value Date   VD25OH 48 09/23/2020     Current Outpatient Medications on File Prior to  Visit  Medication Sig   acetaminophen  650 MG CR tablet Take 650 mg every 8 hours as needed for pain   VITAMIN D3 10000 units capsule Take  at bedtime.    citalopram 20 MG tablet TAKE 1 TABLET DAILY     TRULICITY 2.45 YK/9.9IP SOPN Inject 0.75 mg in am weekly under skin   fenofibrate 160 MG tablet Take 1 tablet  Daily    fexofenadine 180 MG tablet Take daily as needed for allergies    FLONASE nasal spray Place 2 sprays into  nostrils daily as needed   glipiZIDE 5 MG tablet Take 1-2 tablets 2 times daily before a meal.   levETIRAcetam (KEPPRA) 500 MG  Take 1 tablet  2  times daily.   metFORMIN  500 MG tablet Take 1 tablet daily with supper.   mupirocin ointment 2 % Apply  as needed (skin infection)   omeprazole 40 MG capsule Take 1 capsule Daily   polyethylene glycol  Take daily as needed    pyridOXINE (B-6) 50 MG tablet Take 1 tablet daily.   rosuvastatin 10 MG tablet Take  1 tablet  Daily     valsartan 160 MG tablet Take  1 tablet  Daily      Allergies  Allergen Reactions   Cardura [Doxazosin Mesylate]     Nasal congestion   Codeine     The patient passed out        Reaction not recalled    Past Medical History:  Diagnosis Date   Allergy    Anxiety    Cataract    beginning stage 01/22/2019   Chronic kidney disease    enlarge bladder   Diabetes mellitus without complication (HCC)    GERD (gastroesophageal reflux disease)    Gout    Hyperlipidemia    Hypertension    OSA (obstructive sleep apnea)    Pre-diabetes    Seizures ([place])    Sleep apnea    c pap    Health Maintenance  Topic Date Due   Zoster Vaccines- Shingrix (1 of 2) Never done   COVID-19 Vaccine (3 - Pfizer risk series) 11/17/2019   FOOT EXAM  03/02/2021   INFLUENZA VACCINE  03/21/2021   HEMOGLOBIN A1C  07/21/2021   OPHTHALMOLOGY EXAM  12/30/2021   COLONOSCOPY ( 02/02/2022   TETANUS/TDAP  05/31/2025   Hepatitis C Screening  Completed   PNA vac Low Risk Adult  Completed   HPV VACCINES  Aged Out      Immunization History  Administered Date(s) Administered   DT  06/01/2015   Fluad Quad - high Dose 65+) 05/20/2020   Influenza Whole 06/05/2013   Influenza, High Dose  06/11/2018, 05/13/2019   PFIZER  SARS-COV-2 Vacc 09/27/2019, 10/20/2019   Pneumococcal -13 08/19/2014   Pneumococcal -23 08/22/2003, 12/10/2015   Tdap 03/08/2004   Zoster, Live 08/18/2013    Last Colon - 02/03/2019 - Dr Ardis Hughs - Recc 3 year f/u due Mar 2023  Past Surgical History:  Procedure Laterality Date   COLONOSCOPY     CRANIOTOMY Right 12/29/2019   Procedure: Craniotomy for Tumor; Kristeen Miss, MD   FOOT SURGERY  10/2009   MYRINGOTOMY Left 02-2005   Dr.Newman   TONSILLECTOMY  1953    Family History  Problem Relation Age of Onset   Hypertension Mother    GER disease Mother    Hyperlipidemia Father    Hypertension Father    Parkinson's disease Father    Hypertension Brother    Diabetes Maternal Grandmother    Stroke Paternal Grandmother    Diabetes Paternal Grandmother    Stroke Paternal Grandfather    Colon cancer Neg Hx    Colon polyps Neg Hx    Esophageal cancer Neg Hx    Stomach cancer Neg Hx    Rectal cancer Neg Hx     Social History   Socioeconomic History   Marital status: Married    Spouse name: Peggy   Number of children: 1 adopted daughter  Occupational History   Occupation: Recruitment consultant    Comment: on STD 10/25/20  Tobacco Use   Smoking status: Never   Smokeless tobacco: Never  Vaping Use   Vaping Use: Never used  Substance and Sexual Activity   Alcohol use: No   Drug use: No   Sexual activity: Not on file  Social History Narrative   Lives with wife   Right handed   Drinks 3-4 cups caffeine daily    ROS Constitutional: Denies fever, chills, weight loss/gain, headaches, insomnia,  night sweats or change in appetite. Does c/o fatigue. Eyes: Denies redness, blurred vision, diplopia, discharge, itchy or  watery eyes.  ENT: Denies discharge, congestion, post nasal drip,  epistaxis, sore throat, earache, hearing loss, dental pain, Tinnitus, Vertigo, Sinus pain or snoring.  Cardio: Denies chest pain, palpitations, irregular heartbeat, syncope, dyspnea, diaphoresis, orthopnea, PND, claudication or edema Respiratory: denies cough, dyspnea, DOE, pleurisy, hoarseness, laryngitis or wheezing.  Gastrointestinal: Denies dysphagia, heartburn, reflux, water brash, pain, cramps, nausea, vomiting, bloating, diarrhea, constipation, hematemesis, melena, hematochezia, jaundice or hemorrhoids Genitourinary: Denies dysuria, frequency, urgency, nocturia, hesitancy, discharge, hematuria or flank pain Musculoskeletal: Denies arthralgia, myalgia, stiffness, Jt. Swelling, pain, limp or strain/sprain. Denies Falls. Skin: Denies puritis, rash, hives, warts, acne, eczema or change in skin lesion Neuro: No weakness, tremor, incoordination, spasms, paresthesia or pain Psychiatric: Denies confusion, memory loss or sensory loss. Denies Depression. Endocrine: Denies change in weight, skin, hair change, nocturia, and paresthesia, diabetic polys, visual blurring or hyper / hypo glycemic episodes.  Heme/Lymph: No excessive bleeding, bruising or enlarged lymph nodes.   Physical Exam  BP 118/78   Pulse 77   Temp (!) 96.8 F (36 C)   Resp 16   Ht 5\' 10"  (1.778 m)   Wt 218 lb 12.8 oz (99.2 kg)   SpO2 98%   BMI 31.39 kg/m   General Appearance: Well nourished and well groomed and in no apparent distress.  Eyes: PERRLA, EOMs, conjunctiva no swelling or erythema, normal fundi and vessels. Sinuses: No frontal/maxillary tenderness ENT/Mouth: EACs patent / TMs  nl. Nares clear without erythema, swelling, mucoid exudates. Oral hygiene is good. No erythema, swelling, or exudate. Tongue normal, non-obstructing. Tonsils not swollen or erythematous. Hearing normal.  Neck: Supple, thyroid not palpable. No bruits, nodes or JVD. Respiratory: Respiratory effort normal.  BS equal and clear bilateral  without rales, rhonci, wheezing or stridor. Cardio: Heart sounds are normal with regular rate and rhythm and no murmurs, rubs or gallops. Peripheral pulses are normal and equal bilaterally without edema. No aortic or femoral bruits. Chest: symmetric with normal excursions and percussion.  Abdomen: Soft, with Nl bowel sounds. Nontender, no guarding, rebound, hernias, masses, or organomegaly.  Lymphatics: Non tender without lymphadenopathy.  Musculoskeletal: Full ROM all peripheral extremities, joint stability, 5/5 strength, and normal gait. Skin: Warm and dry without rashes, lesions, cyanosis, clubbing or  ecchymosis.  Neuro: Cranial nerves intact, reflexes equal bilaterally. Normal muscle tone, no cerebellar symptoms. Sensation intact.  Pysch: Alert and oriented X 3 with normal affect, insight and judgment appropriate.   Assessment and Plan  1. Essential hypertension  - EKG 12-Lead - Urinalysis, Routine w reflex microscopic - Microalbumin / creatinine urine ratio - CBC with Differential/Platelet - COMPLETE METABOLIC PANEL WITH GFR - Magnesium - TSH  2. Hyperlipidemia associated with type 2 diabetes mellitus ([place])  - EKG 12-Lead - [place], RETROPERITNL ABD,  LTD - Lipid panel - TSH  3. Type 2 diabetes mellitus with stage 3a chronic kidney  disease, without long-term current use of insulin (HCC)  - EKG 12-Lead - [place], RETROPERITNL ABD,  LTD - HM DIABETES FOOT EXAM - LOW EXTREMITY NEUR EXAM DOCUM - Hemoglobin A1c - Insulin, random  4. Vitamin D deficiency  - VITAMIN D 25 Hydroxy  5. Seizures (HCC)  - Levetiracetam level  6. Prostate cancer screening   7. Screening for colorectal cancer  - POC Hemoccult Bld/Stl   8. Screening for ischemic heart disease  - EKG 12-Lead  9. FHx: heart disease  - EKG 12-Lead - [place], RETROPERITNL ABD,  LTD  10. Screening for AAA (aortic abdominal aneurysm)  - [place], RETROPERITNL  ABD,  LTD  11. OSA on CPAP   12. Medication  management  - Urinalysis, Routine w reflex microscopic - Microalbumin / creatinine urine ratio - CBC with Differential/Platelet - COMPLETE METABOLIC PANEL WITH GFR - Magnesium - Lipid panel - TSH - Hemoglobin A1c - Insulin, random - VITAMIN D 25 Hydroxy          Patient was counseled in prudent diet, weight control to achieve/maintain BMI less than 25, BP monitoring, regular exercise and medications as discussed.  Discussed med effects and SE's. Routine screening labs and tests as requested with regular follow-up as recommended. Over 40 minutes of exam, counseling, chart review and high complex critical decision making was performed   Kirtland Bouchard, MD

## 2021-03-05 NOTE — Progress Notes (Signed)
============================================================ - Test results slightly outside the reference range are not unusual. If there is anything important, I will review this with you,  otherwise it is considered normal test values.  If you have further questions,  please do not hesitate to contact me at the office or via My Chart.  ============================================================ ============================================================  -   Blood Creatinine and GFR arte higher suggesting worse kidney function   - GFR - is down to 35 - now Stage 3b probably due to                                                       Dehydration from NOT drinking enough water  - So the solution or "fix" is to drink Lots more fluids  !                                                          So you don't end up on a Dialysis machine  !   Very important to drink adequate amounts of fluids to prevent permanent damage    - Recommend drink at least 6 bottles (16 ounces) of fluids /water /day = 96 Oz ~100 oz  - 100 oz = 3,000 cc or 3 liters / day  - >> That's 1 &1/2 bottles of a 2 liter soda bottle /day !  ============================================================ ============================================================  - Total Chol = 165   and LDL Chol = 98 - Great   - But ,  . . . . . . . . . . . Triglycerides (  243  ) or fats in blood are too high                                              (Goal is less than 150)    - Recommend avoid fried & greasy foods,  sweets / candy,   - Avoid white rice  (brown or wild rice or Quinoa is OK),   - Avoid white potatoes  (sweet potatoes are OK)   - Avoid anything made from white flour  - bagels, doughnuts, rolls, buns, biscuits, white and   wheat breads, pizza crust and traditional  pasta made of white flour & egg white  - (vegetarian pasta or spinach or wheat pasta is OK).    - Multi-grain bread is OK - like multi-grain  flat bread or  sandwich thins.   - Avoid alcohol in excess.   - Exercise is also important. ============================================================ ============================================================  - TSH is slightly elevated - Very important that you don't take any                                                   Biotin Supplements that interfere with the  measurement of the Thyroid blood tests  ============================================================ ============================================================  - A1c is a little better  - down from 6.7% to now 6.0%                                                         - Please keep working on the Weight Loss ! ============================================================ ============================================================  - Insulin= 37.5 is elevated  ( Normal is less than 20  !  ) and shows  Insulin Resistance - a sign of early diabetes and associated with a  300 % greater risk for heart attacks, strokes, cancer &  Alzheimer type vascular dementia    All this can be cured  and prevented with losing weight   - get Dr Fara Olden Fuhrman's book 'the End of Diabetes" and "the End of Dieting"   - and add many years of good health to your life. ============================================================ ============================================================  - Vitamin D = 732  - Excellent  ============================================================ ============================================================  - Keppra Level still pending  ============================================================ ============================================================

## 2021-03-06 ENCOUNTER — Encounter: Payer: Self-pay | Admitting: Internal Medicine

## 2021-03-08 ENCOUNTER — Encounter: Payer: Self-pay | Admitting: Neurology

## 2021-03-08 ENCOUNTER — Ambulatory Visit (INDEPENDENT_AMBULATORY_CARE_PROVIDER_SITE_OTHER): Payer: Medicare Other | Admitting: Neurology

## 2021-03-08 VITALS — BP 129/78 | HR 66 | Ht 70.0 in | Wt 218.5 lb

## 2021-03-08 DIAGNOSIS — G40909 Epilepsy, unspecified, not intractable, without status epilepticus: Secondary | ICD-10-CM | POA: Diagnosis not present

## 2021-03-08 DIAGNOSIS — Z8603 Personal history of neoplasm of uncertain behavior: Secondary | ICD-10-CM

## 2021-03-08 DIAGNOSIS — K219 Gastro-esophageal reflux disease without esophagitis: Secondary | ICD-10-CM

## 2021-03-08 DIAGNOSIS — M2619 Other specified anomalies of jaw-cranial base relationship: Secondary | ICD-10-CM | POA: Insufficient documentation

## 2021-03-08 DIAGNOSIS — Z8669 Personal history of other diseases of the nervous system and sense organs: Secondary | ICD-10-CM | POA: Diagnosis not present

## 2021-03-08 DIAGNOSIS — Z9889 Other specified postprocedural states: Secondary | ICD-10-CM | POA: Diagnosis not present

## 2021-03-08 DIAGNOSIS — R0683 Snoring: Secondary | ICD-10-CM

## 2021-03-08 LAB — TSH: TSH: 5.08 mIU/L — ABNORMAL HIGH (ref 0.40–4.50)

## 2021-03-08 LAB — CBC WITH DIFFERENTIAL/PLATELET
Absolute Monocytes: 599 cells/uL (ref 200–950)
Basophils Absolute: 37 cells/uL (ref 0–200)
Basophils Relative: 0.5 %
Eosinophils Absolute: 244 cells/uL (ref 15–500)
Eosinophils Relative: 3.3 %
HCT: 41.3 % (ref 38.5–50.0)
Hemoglobin: 14 g/dL (ref 13.2–17.1)
Lymphs Abs: 1776 cells/uL (ref 850–3900)
MCH: 29.2 pg (ref 27.0–33.0)
MCHC: 33.9 g/dL (ref 32.0–36.0)
MCV: 86.2 fL (ref 80.0–100.0)
MPV: 10.4 fL (ref 7.5–12.5)
Monocytes Relative: 8.1 %
Neutro Abs: 4743 cells/uL (ref 1500–7800)
Neutrophils Relative %: 64.1 %
Platelets: 202 10*3/uL (ref 140–400)
RBC: 4.79 10*6/uL (ref 4.20–5.80)
RDW: 13.2 % (ref 11.0–15.0)
Total Lymphocyte: 24 %
WBC: 7.4 10*3/uL (ref 3.8–10.8)

## 2021-03-08 LAB — URINALYSIS, ROUTINE W REFLEX MICROSCOPIC
Bilirubin Urine: NEGATIVE
Glucose, UA: NEGATIVE
Hgb urine dipstick: NEGATIVE
Ketones, ur: NEGATIVE
Leukocytes,Ua: NEGATIVE
Nitrite: NEGATIVE
Protein, ur: NEGATIVE
Specific Gravity, Urine: 1.02 (ref 1.001–1.035)
pH: 5.5 (ref 5.0–8.0)

## 2021-03-08 LAB — COMPLETE METABOLIC PANEL WITH GFR
AG Ratio: 1.9 (calc) (ref 1.0–2.5)
ALT: 11 U/L (ref 9–46)
AST: 19 U/L (ref 10–35)
Albumin: 4.4 g/dL (ref 3.6–5.1)
Alkaline phosphatase (APISO): 52 U/L (ref 35–144)
BUN/Creatinine Ratio: 12 (calc) (ref 6–22)
BUN: 24 mg/dL (ref 7–25)
CO2: 26 mmol/L (ref 20–32)
Calcium: 9.5 mg/dL (ref 8.6–10.3)
Chloride: 107 mmol/L (ref 98–110)
Creat: 1.97 mg/dL — ABNORMAL HIGH (ref 0.70–1.28)
Globulin: 2.3 g/dL (calc) (ref 1.9–3.7)
Glucose, Bld: 133 mg/dL — ABNORMAL HIGH (ref 65–99)
Potassium: 4.1 mmol/L (ref 3.5–5.3)
Sodium: 142 mmol/L (ref 135–146)
Total Bilirubin: 0.4 mg/dL (ref 0.2–1.2)
Total Protein: 6.7 g/dL (ref 6.1–8.1)
eGFR: 35 mL/min/{1.73_m2} — ABNORMAL LOW (ref >=60)

## 2021-03-08 LAB — INSULIN, RANDOM: Insulin: 37.5 u[IU]/mL — ABNORMAL HIGH

## 2021-03-08 LAB — LIPID PANEL
Cholesterol: 165 mg/dL (ref <200)
HDL: 30 mg/dL — ABNORMAL LOW (ref >=40)
LDL Cholesterol (Calc): 98 mg/dL (calc)
Non-HDL Cholesterol (Calc): 135 mg/dL (calc) — ABNORMAL HIGH (ref <130)
Total CHOL/HDL Ratio: 5.5 (calc) — ABNORMAL HIGH (ref <5.0)
Triglycerides: 243 mg/dL — ABNORMAL HIGH (ref <150)

## 2021-03-08 LAB — MICROALBUMIN / CREATININE URINE RATIO
Creatinine, Urine: 142 mg/dL (ref 20–320)
Microalb Creat Ratio: 3 mcg/mg creat (ref <30)
Microalb, Ur: 0.4 mg/dL

## 2021-03-08 LAB — HEMOGLOBIN A1C
Hgb A1c MFr Bld: 6 % of total Hgb — ABNORMAL HIGH (ref <5.7)
Mean Plasma Glucose: 126 mg/dL
eAG (mmol/L): 7 mmol/L

## 2021-03-08 LAB — VITAMIN D 25 HYDROXY (VIT D DEFICIENCY, FRACTURES): Vit D, 25-Hydroxy: 72 ng/mL (ref 30–100)

## 2021-03-08 LAB — LEVETIRACETAM, IMMUNOASSAY: LEVETIRACETAM, IMMUNOASSAY: 15.4 ug/mL (ref 6.0–46.0)

## 2021-03-08 LAB — MAGNESIUM: Magnesium: 1.9 mg/dL (ref 1.5–2.5)

## 2021-03-08 NOTE — Addendum Note (Signed)
Addended by: Larey Seat on: 03/08/2021 09:51 AM   Modules accepted: Orders

## 2021-03-08 NOTE — Progress Notes (Signed)
I agree with the above plan 

## 2021-03-08 NOTE — Progress Notes (Signed)
SLEEP MEDICINE CLINIC    Provider:  Larey Seat, MD  Primary Care Physician:  Jeffery Pinto, MD [address] [place] [address]     Referring Provider: Unk Reed, [address] [place],  [place] [id number]          Chief Complaint according to patient   Patient presents with:     New Patient (Initial Visit)           HISTORY OF PRESENT ILLNESS:  Jeffery Reed is a 75 y.o. year old White or Caucasian male patient seen here as a referral on 03/08/2021 from Jeffery Reed for a sleep consult.  Chief concern according to patient : Every day [name] states that he had a meningioma surgery and resultant had a seizure he also has a history of hypertension, hyperlipidemia, prediabetes , CKD 3, no longer gout-and known obstructive sleep apnea for which he uses a over 105 year old CPAP but he has not used it in the last 12 months.  He was diagnosed in early 2020 with a large right frontal convexity meningioma at the time he presented with left leg pain and had a fall.  He underwent the excision of meningioma on 12-29-2019 by Jeffery Reed and did well Decadron has been tapered and he was given 500 mg twice daily of Keppra which she took for 6 months for seizure prophylaxis and tolerated well.  His wife had noticed a mild personality change he did continue the [place] for 6 months and then on October 15, 2020 he experienced an episode of involuntary movement of his jaw and difficulty controlling it after eating food food.  This lasted only a couple of minutes but it was a rhythmic chewing and seem to be an automatic movement rather than purposeful.  His words did also not come out.  He was aware of his surroundings there was no extremity tonic-clonic movement no obvious trigger.  The patient is referred today to establish CPAP care he does have some habitual nocturia so every 2-3 hours at night he will have to go to the bathroom but he still achieves about  6 to 8 hours of sleep at night.  He was followed by [facility].   He had used his CPAP until he felt that weight loss has made it superfluous.  His wife still noticed that he sometimes snores he does have occasional fatigue and nocturia but there were no longer any witnessed apneas.  So we meet today to establish a diagnosis of sleep apnea or rule out the presence of sleep apnea.  Keppra was restarted in the last visit on 5-11 2022 and the patient has a 7-month follow-up in November with Jeffery. Leonie Reed.   Jeffery Reed  has a past medical history of Allergy, Anxiety, Cataract, Chronic kidney disease, Diabetes mellitus without complication ([place]), GERD (gastroesophageal reflux disease), Gout, Hyperlipidemia, Hypertension, OSA (obstructive sleep apnea), Pre-diabetes, Seizures ([place]), and Sleep apnea.   The patient had the first sleep study in the year 2007  with a result of OSA.    Sleep relevant medical history: Nocturia/ Enuresis ,had a childhood Tonsillectomy, nasal congestion.  Family medical negative other family member on CPAP with OSA, insomnia, sleep walkers.  brother with tremor.    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  Tobacco use-never .  ETOH use - rare , Caffeine intake in form of Coffee( 1 cup in AM ) Tea ( sweet-  when eating out) or energy drinks. Regular exercise in form of yard work.    Sleep habits are as follows: The patient's dinner time is between 5-7 PM. The patient goes to bed at 10 PM and continues to sleep for 6-8 hours, wakes for 2-3 bathroom breaks, the first time at 2-3 AM.  The preferred sleep position is supine, with the support of 1 pillows, flat surface.  Dreams are reportedly rare.  6  AM is the usual rise time. The patient wakes up spontaneously 5.30. He reports feeling refreshed  in AM, without symptoms such as dry mouth,   Naps are taken daily after lunch, 1-2 hours long-more or as  refreshing as nocturnal sleep.    Review of Systems: Out of a complete 14 system review, the patient complains of only the following symptoms, and all other reviewed systems are negative.:  Fatigue, sleepiness , snoring, fragmented sleep- nocturia-  Leg was weaker when meningeoma was discovered. Single seizure post meningeoma resection, happened in a restaurant.    How likely are you to doze in the following situations: 0 = not likely, 1 = slight chance, 2 = moderate chance, 3 = high chance   Sitting and Reading?1 Watching Television?1 Sitting inactive in a public place (theater or meeting)?2 As a passenger in a car for an hour without a break?1 Lying down in the afternoon when circumstances permit?3 Sitting and talking to someone?0 Sitting quietly after lunch without alcohol? 3 In a car, while stopped for a few minutes in traffic?0   Total = 11 / 24 points   FSS endorsed at 28/ 63 points.   Social History   Socioeconomic History   Marital status: Married    Spouse name: Not on file   Number of children: Not on file   Years of education: Not on file   Highest education level: Not on file  Occupational History   Occupation: Recruitment consultant    Comment: on STD 10/25/20  Tobacco Use   Smoking status: Never   Smokeless tobacco: Never  Vaping Use   Vaping Use: Never used  Substance and Sexual Activity   Alcohol use: No   Drug use: No   Sexual activity: Not on file  Other Topics Concern   Not on file  Social History Narrative   Lives with wife   Right handed   Drinks 3-4 cups caffeine daily   Social Determinants of Health   Financial Resource Strain: Not on file  Food Insecurity: Not on file  Transportation Needs: Not on file  Physical Activity: Not on file  Stress: Not on file  Social Connections: Not on file    Family History  Problem Relation Age of Onset   Hypertension Mother    GER disease Mother     Hyperlipidemia Father    Hypertension Father    Parkinson's disease Father    Hypertension Brother    Diabetes Maternal Grandmother    Stroke Paternal Grandmother    Diabetes Paternal Grandmother    Stroke Paternal Grandfather    Colon cancer Neg Hx    Colon polyps Neg Hx    Esophageal cancer Neg Hx    Stomach cancer Neg Hx    Rectal cancer Neg Hx     Past Medical History:  Diagnosis Date   Allergy    Anxiety    Cataract  beginning stage 01/22/2019   Chronic kidney disease    enlarge bladder   Diabetes mellitus without complication (HCC)    GERD (gastroesophageal reflux disease)    Gout    Hyperlipidemia    Hypertension    OSA (obstructive sleep apnea)    Pre-diabetes    Seizures ([place])    Sleep apnea    c pap    Past Surgical History:  Procedure Laterality Date   COLONOSCOPY     CRANIOTOMY Right 12/29/2019   Procedure: Craniotomy for Tumor;  Surgeon: Kristeen Miss, MD;  Location: [facility];  Service: Neurosurgery;  Laterality: Right;  Craniotomy for Tumor Excision   FOOT SURGERY  10/2009   MYRINGOTOMY Left 02-2005   [facility]     Current Outpatient Medications on File Prior to Visit  Medication Sig Dispense Refill   acetaminophen (TYLENOL) 650 MG CR tablet Take 650 mg by mouth every 8 (eight) hours as needed for pain (or headaches).     Cholecalciferol (VITAMIN D3) 10000 units capsule Take 10,000 Units by mouth at bedtime.      citalopram (CELEXA) 20 MG tablet TAKE 1 TABLET BY MOUTH  DAILY FOR MOOD 90 tablet 3   Dulaglutide (TRULICITY) 9.21 JH/4.1DE SOPN Inject 0.75 mg in am weekly under skin 2 mL 1   fenofibrate 160 MG tablet Take      1 tablet      Daily       for Triglycerides (Blood Fats) (Patient taking differently: Take      1 tablet      Daily       for Triglycerides (Blood Fats)) 90 tablet 3   fexofenadine (ALLEGRA) 180 MG tablet Take 180 mg by mouth daily as needed for allergies or rhinitis.     fluticasone (FLONASE) 50 MCG/ACT nasal spray  Place 2 sprays into both nostrils daily as needed for allergies or rhinitis.     glipiZIDE (GLUCOTROL) 5 MG tablet Take 1-2 tablets (5-10 mg total) by mouth 2 (two) times daily before a meal. 360 tablet 3   glucose blood (FREESTYLE LITE) test strip Check blood sugar 1 time daily-DX-E11.22 100 each 5   levETIRAcetam (KEPPRA) 500 MG tablet Take 1 tablet (500 mg total) by mouth 2 (two) times daily. 180 tablet 3   metFORMIN (GLUCOPHAGE) 500 MG tablet Take 1 tablet (500 mg total) by mouth daily with supper. 90 tablet 3   mupirocin ointment (BACTROBAN) 2 % APPLY TWICE A DAY TO SKIN INFECTION. 66 g 1   omeprazole (PRILOSEC) 40 MG capsule Take     1 capsule     Daily      to Prevent Indigestion & Heartburn (Patient taking differently: Take     1 capsule     Daily      to Prevent Indigestion & Heartburn) 90 capsule 3   polyethylene glycol (MIRALAX / GLYCOLAX) 17 g packet Take 17 g by mouth daily as needed for mild constipation. 14 each 0   pyridOXINE (B-6) 50 MG tablet Take 1 tablet (50 mg total) by mouth daily. 30 tablet 0   rosuvastatin (CRESTOR) 10 MG tablet Take  1 tablet  Daily  for Cholesterol 90 tablet 3   valsartan (DIOVAN) 160 MG tablet Take  1 tablet  Daily  for BP & Diabetic Kidney Protection 90 tablet 1   No current facility-administered medications on file prior to visit.    Allergies  Allergen Reactions   Cardura [Doxazosin Mesylate] Other (  See Comments)    Nasal congestion   Codeine Other (See Comments)    The patient passed out   Hytrin [Terazosin] Other (See Comments)    Reaction not recalled    Physical exam:  Today's Vitals   03/08/21 0859  BP: 129/78  Pulse: 66  Weight: 218 lb 8 oz (99.1 kg)  Height: 5\' 10"  (1.778 m)   Body mass index is 31.35 kg/m.   Wt Readings from Last 3 Encounters:  03/08/21 218 lb 8 oz (99.1 kg)  03/06/21 218 lb 12.8 oz (99.2 kg)  01/19/21 216 lb 9.6 oz (98.2 kg)     Ht Readings from Last 3 Encounters:  03/08/21 5\' 10"  (1.778 m)  03/06/21  5\' 10"  (1.778 m)  01/19/21 5\' 10"  (1.778 m)      General: The patient is awake, alert and appears not in acute distress. The patient is well groomed. Nasal pillow user.  Head: Normocephalic, atraumatic. Neck is supple. Mallampati 3,  neck circumference:18 inches . Nasal airflow not patent.  Retrognathia is strong- high risk for OSA.  Dental status:  irregular  Cardiovascular:  Regular rate and cardiac rhythm by pulse,  without distended neck veins. Respiratory: Lungs are clear to auscultation.  Skin:  Without evidence of ankle edema, or rash. Trunk: The patient's posture is erect.   Neurologic exam : The patient is awake and alert, oriented to place and time.   Memory subjective described as intact.  Attention span & concentration ability appears normal.  Speech is fluent,  with dysphonia and mild slurring of words.  Mood and affect are appropriate.   Cranial nerves: no loss of smell or taste reported  Pupils : right pupil is 1-2 mm larger, round, both are briskly reactive to light. Funduscopic exam deferred. .  Extraocular movements in vertical and horizontal planes were intact and without nystagmus. No Diplopia. Visual fields by finger perimetry are intact. Hearing was intact to soft voice and finger rubbing. He is status post ear surgery. Facial sensation intact to fine touch.  Facial motor strength is symmetric and tongue and uvula move midline.  Neck ROM : rotation, tilt and flexion extension were normal for age and Reed shrug was symmetrical.    Motor exam:  Symmetric bulk, tone and ROM.   Normal tone without cog wheeling, symmetric grip strength .   Sensory:  Fine touch, pinprick and vibration were tested  and  normal.  Proprioception tested in the upper extremities was normal.   Coordination: Rapid alternating movements in the fingers/hands were of normal speed.  The Finger-to-nose maneuver was intact without evidence of ataxia, dysmetria or tremor.   Gait and  station: Patient could rise unassisted from a seated position, walked without assistive device.  Stance is of normal width/ base and the patient turned with 3 steps.  Toe and heel walk were deferred.  Deep tendon reflexes: in the  upper  extremities are symmetric and intact. Patella on the right much more brisk than left-  ankle fracture has left him with some ROM limitation right ankle.  Babinski response was deferred        After spending a total time of  45  minutes face to face and additional time for physical and neurologic examination, review of laboratory studies,  personal review of imaging studies, reports and results of other testing and review of referral information / records as far as provided in visit, I have established the following assessments:  1)  currently not a  CPAP user.  patient lost weight- purposeful- 5 years ago and lost 25-30 pounds and became controlled diabetic.  2) he is still snoring. 3) his main risk factor is retrognathia and supine sleep position.    My Plan is to proceed with:  1) HST to confirm or rule out OSA at this point.  2) a seizure evaluation is not needed. Followed by Jeffery Jeffery Dresser  3) he is prediabetic, had a high glucose level after decadron medication   I would like to thank Jeffery Pinto, MD and Jeffery Reed, [place],  [place] [id number] for allowing me to meet with and to take care of this pleasant patient.   In short, BREYON SIGG is presenting with snoring , a symptom that can be attributed to OSA, and Retrognathia. .   I plan to follow up either personally or through our NP within 2-4 month.   CC: I will share my notes with Frann Rider and Jeffery Reed, PCP> .  Electronically signed by: Jeffery Seat, MD 03/08/2021 9:11 AM  [facility] Board certified by The [employer] of Sleep Medicine and Diplomate of the [employer] of Sleep Medicine. Board  certified In Neurology through the [place], Fellow of the [employer] of Neurology. Medical Director of [facility].

## 2021-03-08 NOTE — Progress Notes (Signed)
============================================================ ============================================================  -    Keppra level finally returned & is in a good range, So please keep dose same   ============================================================ ============================================================

## 2021-03-10 ENCOUNTER — Telehealth: Payer: Self-pay | Admitting: Internal Medicine

## 2021-03-10 NOTE — Chronic Care Management (AMB) (Signed)
  Chronic Care Management   Outreach Note  03/10/2021 Name: Jeffery Reed MRN: [id number] DOB: 12/01/1945  Referred by: Unk Pinto, MD Reason for referral : No chief complaint on file.   An unsuccessful telephone outreach was attempted today. The patient was referred to the pharmacist for assistance with care management and care coordination.   Follow Up Plan:   Tatjana Dellinger Upstream Scheduler

## 2021-03-11 ENCOUNTER — Other Ambulatory Visit: Payer: Self-pay | Admitting: Internal Medicine

## 2021-03-14 ENCOUNTER — Telehealth: Payer: Self-pay | Admitting: Internal Medicine

## 2021-03-14 DIAGNOSIS — N401 Enlarged prostate with lower urinary tract symptoms: Secondary | ICD-10-CM | POA: Diagnosis not present

## 2021-03-14 DIAGNOSIS — R3915 Urgency of urination: Secondary | ICD-10-CM | POA: Diagnosis not present

## 2021-03-14 DIAGNOSIS — R351 Nocturia: Secondary | ICD-10-CM | POA: Diagnosis not present

## 2021-03-14 NOTE — Progress Notes (Signed)
  Chronic Care Management   Note  03/14/2021 Name: Jeffery Reed MRN: [id number] DOB: August 04, 1946  Jeffery Reed is a 75 y.o. year old male who is a primary care patient of Unk Pinto, MD. I reached out to [name] by phone today in response to a referral sent by Jeffery Reed PCP, Unk Pinto, MD.   Mr. Debar was given information about Chronic Care Management services today including:  CCM service includes personalized support from designated clinical staff supervised by his physician, including individualized plan of care and coordination with other care providers 24/7 contact phone numbers for assistance for urgent and routine care needs. Service will only be billed when office clinical staff spend 20 minutes or more in a month to coordinate care. Only one practitioner may furnish and bill the service in a calendar month. The patient may stop CCM services at any time (effective at the end of the month) by phone call to the office staff.   Patient agreed to services and verbal consent obtained.   Follow up plan:  Tatjana Secretary/administrator

## 2021-03-15 ENCOUNTER — Other Ambulatory Visit: Payer: Self-pay | Admitting: Adult Health

## 2021-03-23 ENCOUNTER — Other Ambulatory Visit: Payer: Self-pay | Admitting: Neurological Surgery

## 2021-03-23 ENCOUNTER — Other Ambulatory Visit (HOSPITAL_COMMUNITY): Payer: Self-pay | Admitting: Neurological Surgery

## 2021-03-23 DIAGNOSIS — D320 Benign neoplasm of cerebral meninges: Secondary | ICD-10-CM

## 2021-03-30 ENCOUNTER — Ambulatory Visit (INDEPENDENT_AMBULATORY_CARE_PROVIDER_SITE_OTHER): Payer: Medicare Other | Admitting: Neurology

## 2021-03-30 DIAGNOSIS — R569 Unspecified convulsions: Secondary | ICD-10-CM

## 2021-03-30 DIAGNOSIS — Z8669 Personal history of other diseases of the nervous system and sense organs: Secondary | ICD-10-CM

## 2021-03-30 DIAGNOSIS — G4733 Obstructive sleep apnea (adult) (pediatric): Secondary | ICD-10-CM

## 2021-03-30 DIAGNOSIS — Z8603 Personal history of neoplasm of uncertain behavior: Secondary | ICD-10-CM

## 2021-03-30 DIAGNOSIS — R0683 Snoring: Secondary | ICD-10-CM

## 2021-03-30 DIAGNOSIS — N1832 Chronic kidney disease, stage 3b: Secondary | ICD-10-CM | POA: Diagnosis not present

## 2021-03-30 DIAGNOSIS — K219 Gastro-esophageal reflux disease without esophagitis: Secondary | ICD-10-CM

## 2021-03-30 DIAGNOSIS — Z9889 Other specified postprocedural states: Secondary | ICD-10-CM

## 2021-03-30 DIAGNOSIS — Z9189 Other specified personal risk factors, not elsewhere classified: Secondary | ICD-10-CM

## 2021-03-30 DIAGNOSIS — M2619 Other specified anomalies of jaw-cranial base relationship: Secondary | ICD-10-CM

## 2021-04-03 ENCOUNTER — Other Ambulatory Visit: Payer: Self-pay | Admitting: Internal Medicine

## 2021-04-06 DIAGNOSIS — E1122 Type 2 diabetes mellitus with diabetic chronic kidney disease: Secondary | ICD-10-CM | POA: Diagnosis not present

## 2021-04-06 DIAGNOSIS — N1832 Chronic kidney disease, stage 3b: Secondary | ICD-10-CM | POA: Diagnosis not present

## 2021-04-06 DIAGNOSIS — I129 Hypertensive chronic kidney disease with stage 1 through stage 4 chronic kidney disease, or unspecified chronic kidney disease: Secondary | ICD-10-CM | POA: Diagnosis not present

## 2021-04-06 DIAGNOSIS — N400 Enlarged prostate without lower urinary tract symptoms: Secondary | ICD-10-CM | POA: Diagnosis not present

## 2021-04-06 DIAGNOSIS — E559 Vitamin D deficiency, unspecified: Secondary | ICD-10-CM | POA: Diagnosis not present

## 2021-04-06 DIAGNOSIS — N281 Cyst of kidney, acquired: Secondary | ICD-10-CM | POA: Diagnosis not present

## 2021-04-06 DIAGNOSIS — R778 Other specified abnormalities of plasma proteins: Secondary | ICD-10-CM | POA: Diagnosis not present

## 2021-04-06 NOTE — Progress Notes (Signed)
[facility] TEST REPORT ( by Watch PAT)   STUDY DATE:  03-30-2021 DOB:  08-22-1945 MRN: [id number]   ORDERING CLINICIAN: Larey Seat, MD  REFERRING CLINICIAN: Antony Contras, MD   CLINICAL INFORMATION/HISTORY: Jeffery Reed is a 75 y.o. year old White or Caucasian male patient seen here as a referral on 03/08/2021 from Jeffery Reed for a sleep consult.  Chief concern according to patient : Jeffery Reed states that he had meningioma surgery and had a seizure post surgery- he also has a history of hypertension, hyperlipidemia, prediabetes , CKD 3, no longer gout-and known Know to have obstructive sleep apnea for which he uses a over 71 year old CPAP but he has not used it in the last 12 months. He lost over 25 pounds, has still been snoring and has retrognathia.    He had been diagnosed in early 2020 with a large right frontal convexity meningioma at the time he presented with left leg pain and had a fall.  He underwent the excision of meningioma on 12-29-2019 by Jeffery Reed and did well Decadron has been tapered and he was given 500 mg twice daily of Keppra which she took for 6 months for seizure prophylaxis and tolerated well.  His wife had noticed a mild personality change he did continue the Jeffery Reed for 6 months and then on October 15, 2020 he experienced his mi ost current seizure-an episode of involuntary movement of his jaw and difficulty controlling it after eating food food.    Epworth sleepiness score: 11/24.   BMI: 31.2 kg/m   Neck Circumference: 18"    Sleep Summary: Total Recording Time (hours, min): Total recording time was 7 hours and 50 minutes.  Total sleep time was 6 hours and 30 minutes.  Percentage of REM sleep on sleep time was 18.5%.                               Respiratory Indices: Calculated pAHI (per hour): Apnea hypopnea index per hour was 38/h and much higher in non-REM sleep than in rem sleep.   REM AHI was 13.5 versus non-REM AHI of  43.6/h.   Unfortunately, there were no positional data available.                                                                  Oxygen Saturation Statistics:     O2 Saturation Range (%):   oxygen saturation varied between a nadir at 82% and a maximum at 97% the mean oxygen saturation was 91%.    Time below 90% saturation was 31.8 minutes or 8% of total sleep.   The time below 89% was 15.6 minutes or 4%.                                        Pulse Rate Statistics:                Pulse Range: A home sleep test cannot give information about cardiac rhythm, only documenting a cardiac rate.   The minimum heart rate was 50 bpm, the maximum  98 bpm.  The mean heart rate was 72 bpm.             IMPRESSION:  This HST confirms the presence of known REM sleep dominant sleep apnea at a severe degree.  This is highly suspicious for central sleep apnea, given that REM sleep has a lower apnea hypopnea index and is usually spared from central apnea.    RECOMMENDATION: The patient should follow-up in an in lab titration.  This step would allow to titrate to either CPAP or BiPAP or even ASV as necessary.  It does not seem to be necessary to supplement oxygen.    INTERPRETING PHYSICIAN: Jeffery Seat, MD   Medical Director of [facility] at [facility].   CC ; Jeffery Reed and Jeffery Contras, MD  [facility]: PCP Jeffery Reed.

## 2021-04-10 NOTE — Addendum Note (Signed)
Addended by: Larey Seat on: 04/10/2021 03:47 PM   Modules accepted: Orders

## 2021-04-10 NOTE — Procedures (Signed)
[facility] TEST REPORT ( by Watch PAT)   STUDY DATE:  03-30-2021 DOB:  04/03/1946 MRN: [id number]   ORDERING CLINICIAN: Larey Seat, MD  REFERRING CLINICIAN: Antony Contras, MD   CLINICAL INFORMATION/HISTORY: Jeffery Reed is a 75 y.o. year old White or Caucasian male patient seen here as a referral on 03/08/2021 from Jeffery Reed for a sleep consult.  Chief concern according to patient : Jeffery Reed states that he had meningioma surgery and had a seizure post surgery- he also has a history of hypertension, hyperlipidemia, prediabetes , CKD 3, no longer gout-and known Know to have obstructive sleep apnea for which he uses a over 26 year old CPAP but he has not used it in the last 12 months. He lost over 25 pounds, has still been snoring and has retrognathia.    He had been diagnosed in early 2020 with a large right frontal convexity meningioma at the time he presented with left leg pain and had a fall.  He underwent the excision of meningioma on 12-29-2019 by Jeffery Reed and did well Decadron has been tapered and he was given 500 mg twice daily of Keppra which she took for 6 months for seizure prophylaxis and tolerated well.  His wife had noticed a mild personality change he did continue the [place] for 6 months and then on October 15, 2020 he experienced his mi ost current seizure-an episode of involuntary movement of his jaw and difficulty controlling it after eating food food.    Epworth sleepiness score: 11/24.   BMI: 31.2 kg/m   Neck Circumference: 18"    Sleep Summary: Total Recording Time (hours, min): Total recording time was 7 hours and 50 minutes.  Total sleep time was 6 hours and 30 minutes.  Percentage of REM sleep on sleep time was 18.5%.                               Respiratory Indices: Calculated pAHI (per hour): Apnea hypopnea index per hour was 38/h and much higher in non-REM sleep than in rem sleep.   REM AHI was 13.5 versus non-REM AHI of 43.6/h.    Unfortunately, there were no positional data available.                                                                  Oxygen Saturation Statistics:     O2 Saturation Range (%):   oxygen saturation varied between a nadir at 82% and a maximum at 97% the mean oxygen saturation was 91%.    Time below 90% saturation was 31.8 minutes or 8% of total sleep.   The time below 89% was 15.6 minutes or 4%.                                        Pulse Rate Statistics:                Pulse Range: A home sleep test cannot give information about cardiac rhythm, only documenting a cardiac rate.   The minimum heart rate was 50 bpm, the maximum 98 bpm.  The mean heart rate was 72 bpm.             IMPRESSION:  This HST confirms the presence of known REM sleep dominant sleep apnea at a severe degree.  This is highly suspicious for central sleep apnea, given that REM sleep has a lower apnea hypopnea index and is usually spared from central apnea.    RECOMMENDATION: The patient should follow-up in an in lab titration.  This step would allow to titrate to either CPAP or BiPAP or even ASV as necessary.  It does not seem to be necessary to supplement oxygen.    INTERPRETING PHYSICIAN: Jeffery Seat, MD   Medical Director of [facility] Sleep at [facility].   CC ; Jeffery Reed and Jeffery Contras, MD  [facility]: PCP Jeffery. Melford Reed.

## 2021-04-10 NOTE — Progress Notes (Signed)
IMPRESSION:  This HST confirms the presence of NON-REM sleep dominant sleep apnea at a severe degree.  This is highly suspicious for central sleep apnea, given that REM sleep has a lower apnea hypopnea index and is usually spared from central apnea.    RECOMMENDATION: The patient should follow-up in an in lab titration.  This step would allow to titrate to either CPAP or BiPAP or even ASV as necessary.  It does not seem to be necessary to supplement oxygen.

## 2021-04-11 ENCOUNTER — Telehealth: Payer: Self-pay | Admitting: *Deleted

## 2021-04-11 NOTE — Telephone Encounter (Signed)
-----   Message from Larey Seat, MD sent at 04/10/2021  3:47 PM EDT ----- IMPRESSION:  This HST confirms the presence of NON-REM sleep dominant sleep apnea at a severe degree.  This is highly suspicious for central sleep apnea, given that REM sleep has a lower apnea hypopnea index and is usually spared from central apnea.    RECOMMENDATION: The patient should follow-up in an in lab titration.  This step would allow to titrate to either CPAP or BiPAP or even ASV as necessary.  It does not seem to be necessary to supplement oxygen.

## 2021-04-11 NOTE — Telephone Encounter (Signed)
Called and spoke w/ pt about results per Dr. Dohmeier's note. Pt verbalized understanding.  

## 2021-04-19 ENCOUNTER — Telehealth: Payer: Self-pay

## 2021-04-19 NOTE — Telephone Encounter (Signed)
LVM for pt to call me back to schedule sleep study  

## 2021-04-21 ENCOUNTER — Encounter: Payer: Self-pay | Admitting: Internal Medicine

## 2021-04-21 ENCOUNTER — Ambulatory Visit (INDEPENDENT_AMBULATORY_CARE_PROVIDER_SITE_OTHER): Payer: Medicare Other | Admitting: Internal Medicine

## 2021-04-21 ENCOUNTER — Other Ambulatory Visit: Payer: Self-pay | Admitting: Adult Health

## 2021-04-21 ENCOUNTER — Telehealth: Payer: Self-pay | Admitting: Neurology

## 2021-04-21 ENCOUNTER — Other Ambulatory Visit: Payer: Self-pay

## 2021-04-21 VITALS — BP 128/84 | HR 60 | Ht 70.0 in | Wt 222.6 lb

## 2021-04-21 DIAGNOSIS — E1122 Type 2 diabetes mellitus with diabetic chronic kidney disease: Secondary | ICD-10-CM | POA: Diagnosis not present

## 2021-04-21 DIAGNOSIS — E785 Hyperlipidemia, unspecified: Secondary | ICD-10-CM | POA: Diagnosis not present

## 2021-04-21 DIAGNOSIS — K219 Gastro-esophageal reflux disease without esophagitis: Secondary | ICD-10-CM

## 2021-04-21 DIAGNOSIS — N1831 Chronic kidney disease, stage 3a: Secondary | ICD-10-CM

## 2021-04-21 DIAGNOSIS — E1169 Type 2 diabetes mellitus with other specified complication: Secondary | ICD-10-CM | POA: Diagnosis not present

## 2021-04-21 DIAGNOSIS — E782 Mixed hyperlipidemia: Secondary | ICD-10-CM

## 2021-04-21 MED ORDER — TRULICITY 1.5 MG/0.5ML ~~LOC~~ SOAJ: 1.5000 mg | SUBCUTANEOUS | 3 refills | Status: DC

## 2021-04-21 NOTE — Progress Notes (Signed)
Patient ID: Jeffery Reed, male   DOB: 12-Jun-1946, 75 y.o.   MRN: [id number]   This visit occurred during the SARS-CoV-2 public health emergency.  Safety protocols were in place, including screening questions prior to the visit, additional usage of staff PPE, and extensive cleaning of exam room while observing appropriate contact time as indicated for disinfecting solutions.   HPI: Jeffery Reed is a 75 y.o.-year-old male, initially referred by his PCP, Unk Pinto, MD, returning for follow-up for DM2, dx in 2018, insulin-dependent since 12/2019, uncontrolled, with complications (CKD stage III, diabetic retinopathy).  Last visit 3 months ago.  Interim history: No blurry vision, nausea, chest pain. He continues to have increased urination.  Reviewed HbA1c levels: Lab Results  Component Value Date   HGBA1C 6.0 (H) 03/03/2021   HGBA1C 6.7 (A) 01/19/2021   HGBA1C 5.7 (A) 09/20/2020   HGBA1C 5.5 05/20/2020   HGBA1C 6.3 (H) 03/02/2020   HGBA1C 8.1 (H) 12/29/2019   HGBA1C 7.5 (H) 09/18/2019   HGBA1C 9.4 (H) 06/18/2019   HGBA1C 8.8 (H) 03/17/2019   HGBA1C 9.4 (H) 12/12/2018   Pt is on a regimen of: - Metformin ER 1000 mg 2x a day, with meals >> off Metformin since end of 09/2020 >> restarted 500 mg with dinner 01/2021 - Glipizide 05-25-09 >> 10-10 >> 05-25-09 >> 10 mg 2x a day, before meals - Trulicity A999333 mg weekly - started 01/2021 -tolerated well -  >> stopped 04/2020  Pt checks his sugars once a day: - am: 65-103  >> 68, 80-106 >> 97-169, 175, 198 >> 82-101 - 2h after b'fast:  131, 136, 172 >> n/c >> 100, 148, 243 >> 111-167 - before lunch: 82-151, 184 >> 158, 177, 199 (Christmas) >> n/c >> 144-167 - 2h after lunch: 202 >> n/c >> 118 >> 125, 127 >> n/c - before dinner: n/c >> 103, 111 >> n/c >> 126, 301 >> 187 - 2h after dinner: n/c >> 234 >> n/c >> 240 - bedtime: n/c >> 92, 230 >> n/c >> 94 - nighttime: n/c Lowest sugar was 65 >> 68 >> 97 >> 82; he has hypoglycemia  awareness at 100. Highest sugar was 234 >> 301 >> 240.  Glucometer: Freestyle  Pt's meals are: - Breakfast:  Instant oatmeal or [employer] sausage bisuit or scrambled eggs - Lunch: tomato sandwich, ham - Dinner: larger meal: potatoes; spaghetti, lasagna, chicken, veggies: green beans, corn, broccoli - Snacks:potato chips, apples Diet sodas, unsweet tea,  -+ CKD stage 3b-sees nephrology, last BUN/creatinine:  02/2021: GFR 35 Lab Results  Component Value Date   BUN 24 03/03/2021   BUN 28 (H) 10/25/2020   CREATININE 1.97 (H) 03/03/2021   CREATININE 1.80 (H) 10/25/2020  On valsartan 320.  -+ HL; last set of lipids: Lab Results  Component Value Date   CHOL 165 03/03/2021   HDL 30 (L) 03/03/2021   LDLCALC 98 03/03/2021   TRIG 243 (H) 03/03/2021   CHOLHDL 5.5 (H) 03/03/2021  On Crestor 10, fenofibrate 160.  - last eye exam was on 12/30/2020: + DR, + small cataract.   -+ Mild, occasional numbness and tingling in his feet.  Pt has no FH of DM.  He also has HTN, OSA. Patient has a history of meningioma surgery in 2021.  He recovered well afterwards.  However, he had a seizure in 2022  >> started Keppra.  He is a retired Airline pilot.  During the winter, he drives a schoolbus.   ROS: + See HPI  I reviewed pt's medications, allergies, PMH, social hx, family hx, and changes were documented in the history of present illness. Otherwise, unchanged from my initial visit note.  Past Medical History:  Diagnosis Date   Allergy    Anxiety    Cataract    beginning stage 01/22/2019   Chronic kidney disease    enlarge bladder   Diabetes mellitus without complication (HCC)    GERD (gastroesophageal reflux disease)    Gout    Hyperlipidemia    Hypertension    OSA (obstructive sleep apnea)    Pre-diabetes    Seizures ([place])    Sleep apnea    c pap   Past Surgical History:  Procedure Laterality Date   COLONOSCOPY     CRANIOTOMY Right 12/29/2019   Procedure: Craniotomy for Tumor;   Surgeon: Kristeen Miss, MD;  Location: [facility];  Service: Neurosurgery;  Laterality: Right;  Craniotomy for Tumor Excision   FOOT SURGERY  10/2009   MYRINGOTOMY Left 02-2005   Dr.Newman   TONSILLECTOMY  1953   Social History   Socioeconomic History   Marital status: Married    Spouse name: Not on file   Number of children: Not on file   Years of education: Not on file   Highest education level: Not on file  Occupational History   Occupation: Recruitment consultant    Comment: on STD 10/25/20  Tobacco Use   Smoking status: Never   Smokeless tobacco: Never  Vaping Use   Vaping Use: Never used  Substance and Sexual Activity   Alcohol use: No   Drug use: No   Sexual activity: Not on file  Other Topics Concern   Not on file  Social History Narrative   Lives with wife   Right handed   Drinks 3-4 cups caffeine daily   Social Determinants of Health   Financial Resource Strain: Not on file  Food Insecurity: Not on file  Transportation Needs: Not on file  Physical Activity: Not on file  Stress: Not on file  Social Connections: Not on file  Intimate Partner Violence: Not on file   Current Outpatient Medications on File Prior to Visit  Medication Sig Dispense Refill   acetaminophen (TYLENOL) 650 MG CR tablet Take 650 mg by mouth every 8 (eight) hours as needed for pain (or headaches).     Cholecalciferol (VITAMIN D3) 10000 units capsule Take 10,000 Units by mouth at bedtime.      citalopram (CELEXA) 20 MG tablet TAKE 1 TABLET BY MOUTH DAILY FOR MOOD 90 tablet 3   Dulaglutide (TRULICITY) A999333 0000000 SOPN ADMINISTER 0.75 MG UNDER THE SKIN WEEKLY IN THE MORNING 2 mL 1   fenofibrate 160 MG tablet Take      1 tablet      Daily       for Triglycerides (Blood Fats) (Patient taking differently: Take      1 tablet      Daily       for Triglycerides (Blood Fats)) 90 tablet 3   fexofenadine (ALLEGRA) 180 MG tablet Take 180 mg by mouth daily as needed for allergies or rhinitis.     fluticasone (FLONASE)  50 MCG/ACT nasal spray Place 2 sprays into both nostrils daily as needed for allergies or rhinitis.     glipiZIDE (GLUCOTROL) 5 MG tablet Take 1-2 tablets (5-10 mg total) by mouth 2 (two) times daily before a meal. 360 tablet 3   glucose blood (FREESTYLE LITE) test strip Check blood sugar 1 time  daily-DX-E11.22 100 each 5   levETIRAcetam (KEPPRA) 500 MG tablet Take 1 tablet (500 mg total) by mouth 2 (two) times daily. 180 tablet 3   metFORMIN (GLUCOPHAGE) 500 MG tablet Take 1 tablet (500 mg total) by mouth daily with supper. 90 tablet 3   mupirocin ointment (BACTROBAN) 2 % APPLY TWICE A DAY TO SKIN INFECTION. 66 g 1   omeprazole (PRILOSEC) 40 MG capsule Take     1 capsule     Daily      to Prevent Indigestion & Heartburn (Patient taking differently: Take     1 capsule     Daily      to Prevent Indigestion & Heartburn) 90 capsule 3   polyethylene glycol (MIRALAX / GLYCOLAX) 17 g packet Take 17 g by mouth daily as needed for mild constipation. 14 each 0   pyridOXINE (B-6) 50 MG tablet Take 1 tablet (50 mg total) by mouth daily. 30 tablet 0   rosuvastatin (CRESTOR) 10 MG tablet Take  1 tablet  Daily  for Cholesterol 90 tablet 3   valsartan (DIOVAN) 160 MG tablet Take  1 tablet  Daily  for BP & Diabetic Kidney Protection  / Patient knows to take by mouth  ! 90 tablet 1   No current facility-administered medications on file prior to visit.   Allergies  Allergen Reactions   Cardura [Doxazosin Mesylate] Other (See Comments)    Nasal congestion   Codeine Other (See Comments)    The patient passed out   Hytrin [Terazosin] Other (See Comments)    Reaction not recalled   Family History  Problem Relation Age of Onset   Hypertension Mother    GER disease Mother    Hyperlipidemia Father    Hypertension Father    Parkinson's disease Father    Hypertension Brother    Diabetes Maternal Grandmother    Stroke Paternal Grandmother    Diabetes Paternal Grandmother    Stroke Paternal Grandfather     Colon cancer Neg Hx    Colon polyps Neg Hx    Esophageal cancer Neg Hx    Stomach cancer Neg Hx    Rectal cancer Neg Hx    PE: BP 128/84 (BP Location: Right Arm, Patient Position: Sitting, Cuff Size: Normal)   Pulse 60   Ht '5\' 10"'$  (1.778 m)   Wt 222 lb 9.6 oz (101 kg)   SpO2 96%   BMI 31.94 kg/m  Wt Readings from Last 3 Encounters:  04/21/21 222 lb 9.6 oz (101 kg)  03/08/21 218 lb 8 oz (99.1 kg)  03/06/21 218 lb 12.8 oz (99.2 kg)   Constitutional: overweight, in NAD, + shuffling gait Eyes: PERRLA, EOMI, no exophthalmos ENT: moist mucous membranes, no thyromegaly, no cervical lymphadenopathy Cardiovascular: RRR, No MRG Respiratory: CTA B Gastrointestinal: abdomen soft, NT, ND, BS+ Musculoskeletal: no deformities, strength intact in all 4 Skin: moist, warm, no rashes Neurological: + mild tremor with outstretched R hand, DTR normal in all 4  ASSESSMENT: 1. DM2, insulin-dependent, previously uncontrolled, with long-term complications - CKD stage 3 - DR  2.  Hyperlipidemia  3. Obesity class I  PLAN:  1. Patient with longstanding, type 2 diabetes, previously on insulin, now off basal insulin after an HbA1c decreased to <6%.  Metformin was held earlier in the year after the GFR decreased to 37.  At last visit, however, his kidney function was better so we were able to restart the low-dose metformin, 500 g with dinner.  We continued sulfonylurea  at that time.  I also advised him to add a GLP-1 receptor agonist.  I advised him to start Trulicity A999333 mg weekly and to let me know if she tolerates it well so we can increase the dose.  He did not contact me since last visit.  HbA1c at last check was 6.7%, however, it is improved to 6.0% when checked 1.5 months ago. -At today's visit, sugars are excellent in the morning, all at goal, but they increase as the day goes by.  Since he tolerates well the lower Trulicity dose, we will go ahead and increase the dose to 1.5 mg weekly.  I advised  him that sugars will likely improve afterwards so he will most likely be able to decrease the dose of glipizide to only 5 mg twice a day.  If the sugars continue to decrease, we may need to stop glipizide completely. -Reviewed his latest kidney function available in the GFR was 35.  For now, we can continue the low-dose metformin. - I suggested to:  Patient Instructions  Please continue: - Metformin 500 mg with dinner - Glipizide 10 mg before b'fast and dinner  Increase: - Trulicity to 1.5 mg weekly  Decrease Glipizide to 5 mg 2x a day if sugars decrease <90.  Please return in 3-4 months with your sugar log.   - advised to check sugars at different times of the day - 1x a day, rotating check times - advised for yearly eye exams >> he is UTD - return to clinic in 3-4 months  2. HL -Reviewed latest lipid panel from 02/2021: LDL above our target of less than 70, triglycerides high, HDL low: Lab Results  Component Value Date   CHOL 165 03/03/2021   HDL 30 (L) 03/03/2021   LDLCALC 98 03/03/2021   TRIG 243 (H) 03/03/2021   CHOLHDL 5.5 (H) 03/03/2021  -He continues on Crestor 10 mg daily and fenofibrate 160 mg daily without side effects  3.  Obesity class I -We were able to stop Lantus in 2021, which was also contributing to weight gain -At last visit, we also started a GLP-1 receptor agonist and metformin and this should further help with weight loss.  However, he did not remember to let me know to send a prescription for the higher dose of Trulicity to his pharmacy so we will do this now.  This should further help with weight loss -He gained 1 pound before last visit 6 pounds since last visit   Philemon Kingdom, MD PhD [facility]

## 2021-04-21 NOTE — Patient Instructions (Signed)
Please continue: - Metformin 500 mg with dinner - Glipizide 10 mg before b'fast and dinner  Increase: - Trulicity to 1.5 mg weekly  Decrease Glipizide to 5 mg 2x a day if sugars decrease <90.  Please return in 3-4 months with your sugar log.

## 2021-04-21 NOTE — Telephone Encounter (Signed)
Emma - Pt came into the lobby to discuss the results of trying a new machine because the message left on his voicemail was deleted. His best# [phone] home or cell [phone]. he if fine with a call back. Thanks

## 2021-04-21 NOTE — Telephone Encounter (Signed)
Pt came into the lobby to discuss the machine that you would like to have him try. He stated someone left a vm but it got deleted. Can call the pt either on home or cell. Thank you

## 2021-05-02 ENCOUNTER — Other Ambulatory Visit: Payer: Self-pay

## 2021-05-02 ENCOUNTER — Ambulatory Visit
Admission: RE | Admit: 2021-05-02 | Discharge: 2021-05-02 | Disposition: A | Discharge status: Home / Self Care | Payer: Medicare Other | Source: Ambulatory Visit | Attending: Neurological Surgery | Admitting: Neurological Surgery

## 2021-05-02 DIAGNOSIS — D320 Benign neoplasm of cerebral meninges: Secondary | ICD-10-CM | POA: Insufficient documentation

## 2021-05-02 DIAGNOSIS — G319 Degenerative disease of nervous system, unspecified: Secondary | ICD-10-CM | POA: Diagnosis not present

## 2021-05-02 IMAGING — MR MR HEAD WO/W CM
14 series · 48 of 48 positions shown · IV contrast (gadavist)
Comparison: [DATE]

CLINICAL DATA: Follow-up meningioma, resected in [7H].

EXAM:
MRI HEAD WITHOUT AND WITH CONTRAST
TECHNIQUE: Multiplanar, multiecho pulse sequences of the brain and surrounding
structures were obtained without and with intravenous contrast.
CONTRAST:  10mL GADAVIST GADOBUTROL 1 MMOL/ML IV SOLN

[Series 5: ax dwi_tracew · axial · 3.0mm · 0.65mm/px · z∈[-65,+89]mm · 4 of 48 slices shown]
[im 1/48]
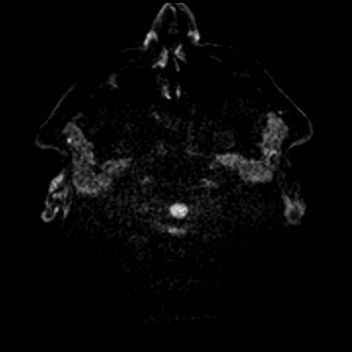
[im 16/48]
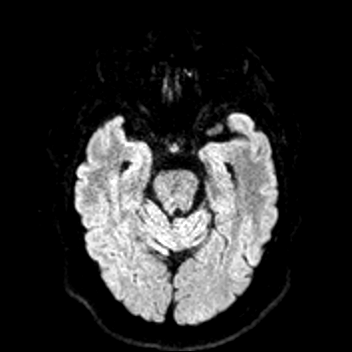
[im 32/48]
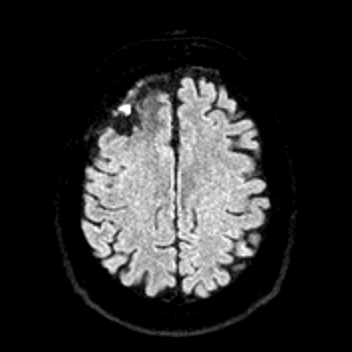
[im 48/48]
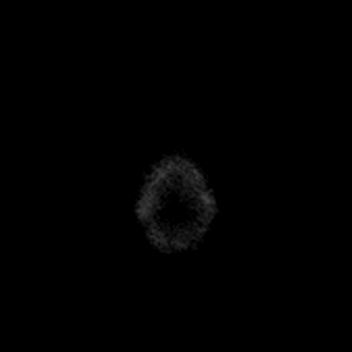

[Series 6: ax dwi_adc · axial · 3.0mm · 0.65mm/px · z∈[-65,+89]mm · 3 of 48 slices shown]
[im 1/48]
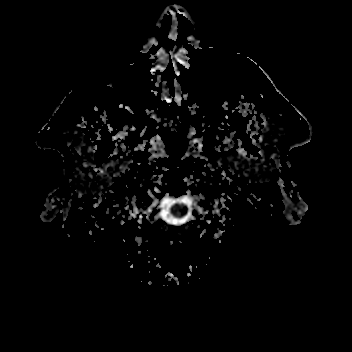
[im 24/48]
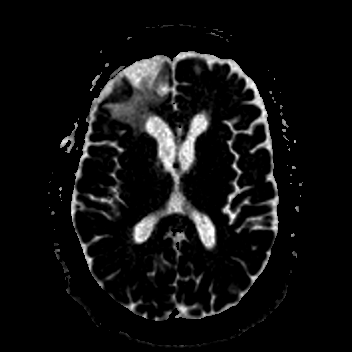
[im 48/48]
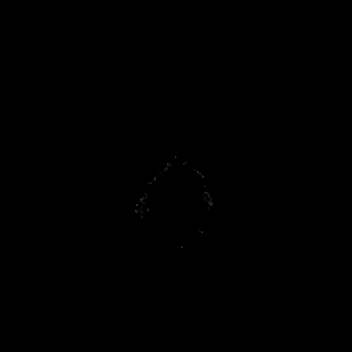

[Series 7: cor dwi_tracew · coronal · 5.0mm · 0.68mm/px · 2 of 40 slices shown]
[im 1/40]
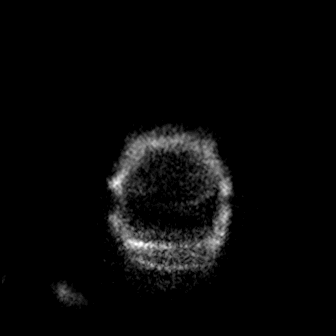
[im 40/40]
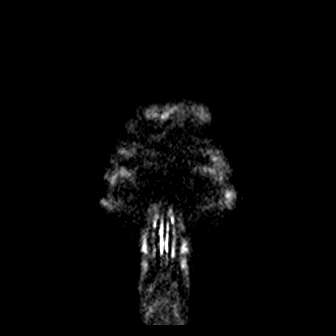

[Series 8: cor dwi_adc · coronal · 5.0mm · 0.68mm/px · 2 of 40 slices shown]
[im 1/40]
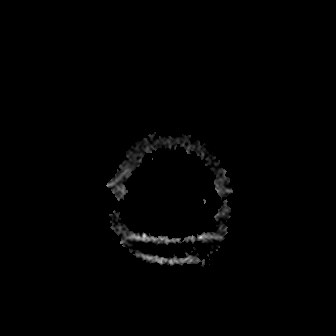
[im 40/40]
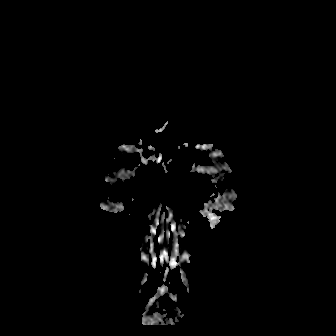

[Series 9: T1 · sagittal · 5.0mm · 0.62mm/px · 1 of 25 slices shown (1 of 2)]
[im 1/25]
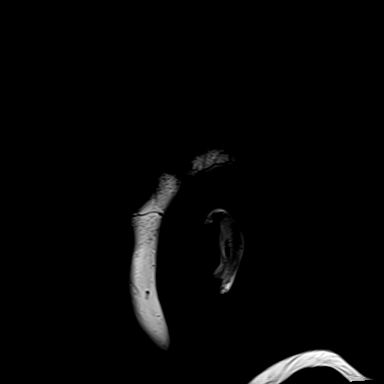

[Series 10: T2 · axial · 5.0mm · 0.55mm/px · z∈[-67,+87]mm · 2 of 27 slices shown]
[im 1/27]
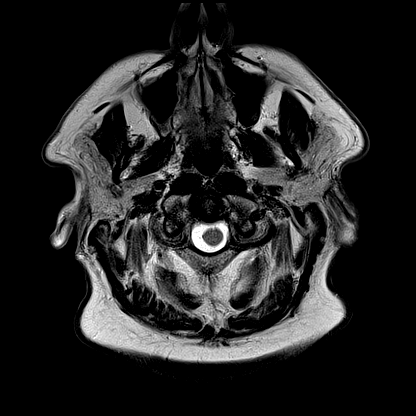
[im 27/27]
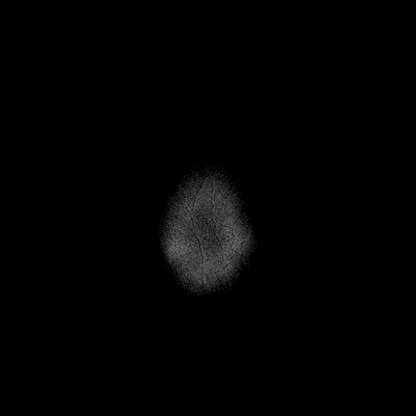

[Series 12: pha_images · axial · 3.0mm · 0.90mm/px · z∈[-77,+93]mm · 3 of 58 slices shown]
[im 1/58]
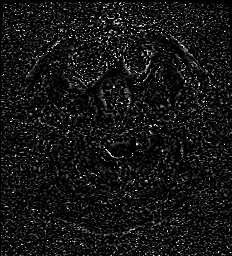
[im 29/58]
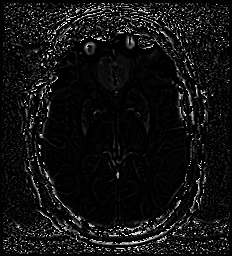
[im 58/58]
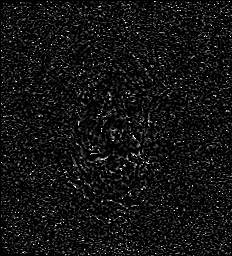

[Series 13: swi_images · axial · 3.0mm · 0.90mm/px · z∈[-77,+98]mm · 3 of 60 slices shown]
[im 1/60]
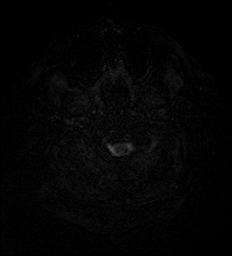
[im 30/60]
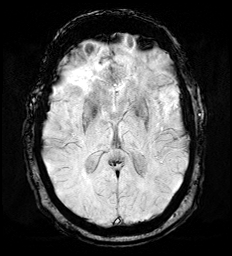
[im 60/60]
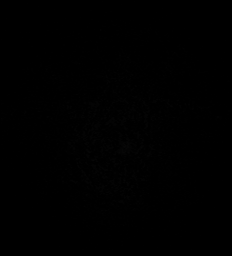

[Series 15: FLAIR · axial · 3.0mm · 0.55mm/px · z∈[-70,+90]mm · 3 of 55 slices shown]
[im 1/55]
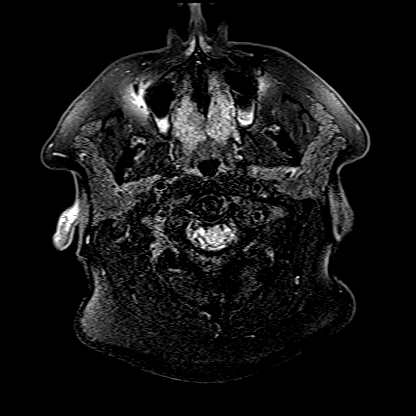
[im 28/55]
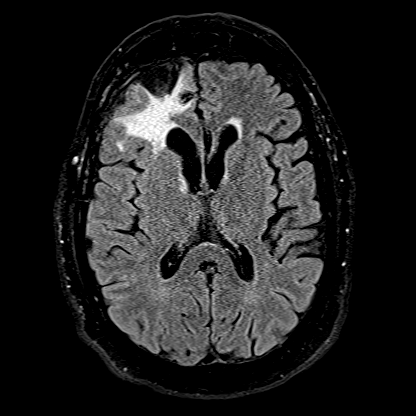
[im 55/55]
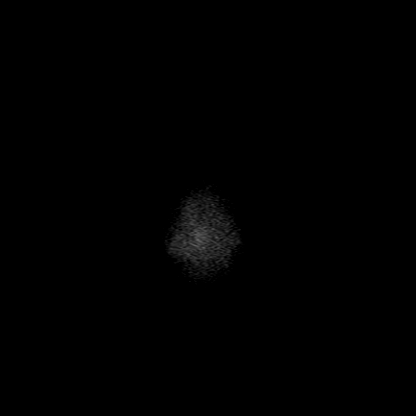

[Series 16: T1 · axial · 1.0mm · 0.98mm/px · z∈[-72,+101]mm · 10 of 176 slices shown (2 of 2)]
[im 1/176]
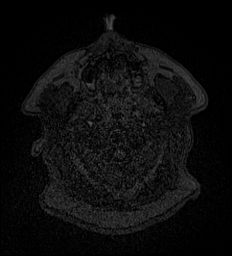
[im 20/176]
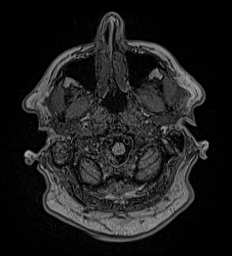
[im 39/176]
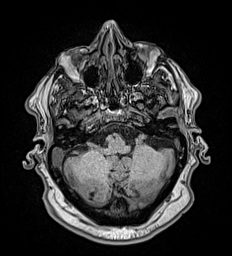
[im 59/176]
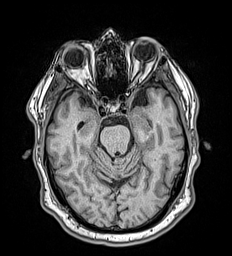
[im 78/176]
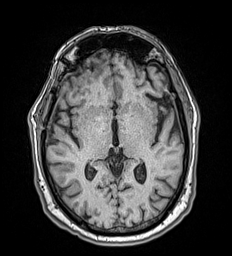
[im 98/176]
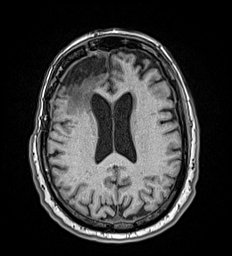
[im 117/176]
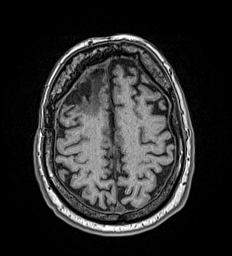
[im 137/176]
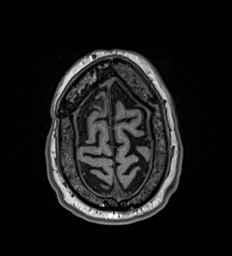
[im 156/176]
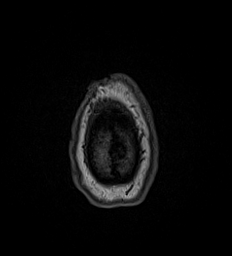
[im 176/176]
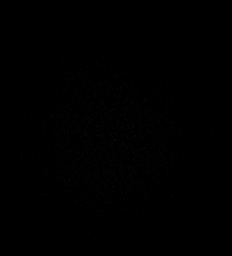

[Series 17: T2 post-contrast · coronal · 5.0mm · 0.57mm/px · 2 of 33 slices shown]
[im 1/33]
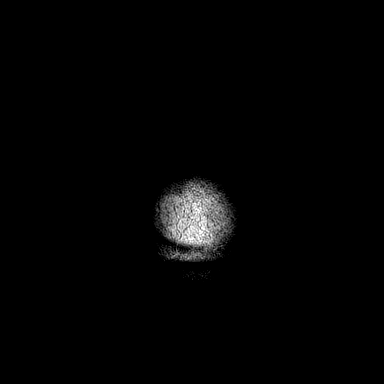
[im 33/33]
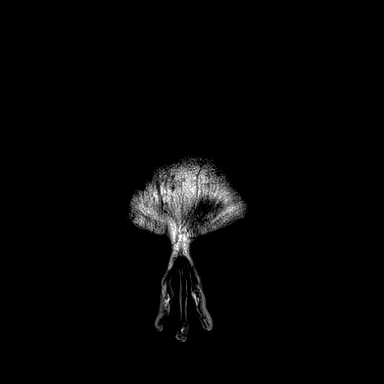

[Series 18: T1 post-contrast · axial · 1.0mm · 0.98mm/px · z∈[-72,+101]mm · 10 of 176 slices shown (1 of 3)]
[im 1/176]
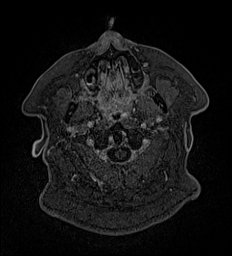
[im 20/176]
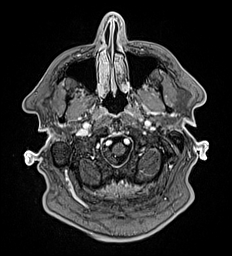
[im 39/176]
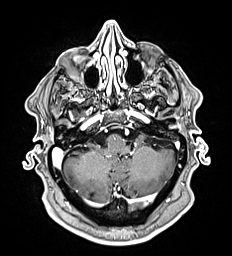
[im 59/176]
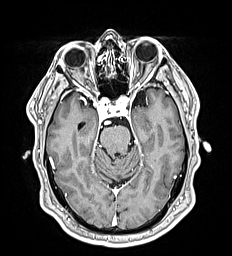
[im 78/176]
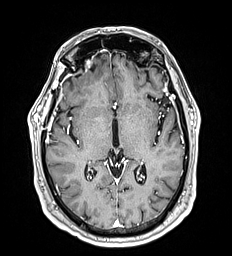
[im 98/176]
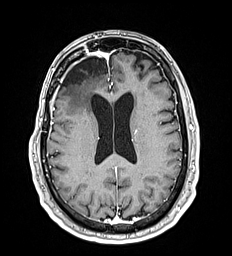
[im 117/176]
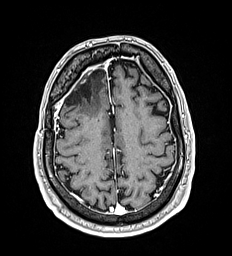
[im 137/176]
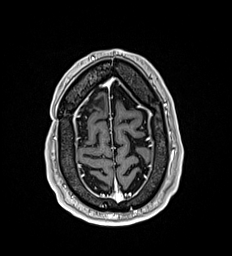
[im 156/176]
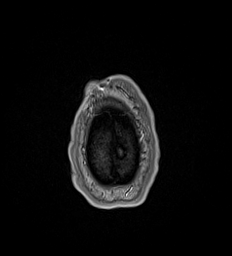
[im 176/176]
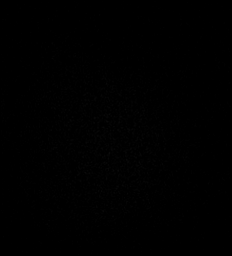

[Series 19: T1 post-contrast · coronal · 5.0mm · 0.57mm/px · 2 of 33 slices shown (2 of 3)]
[im 1/33]
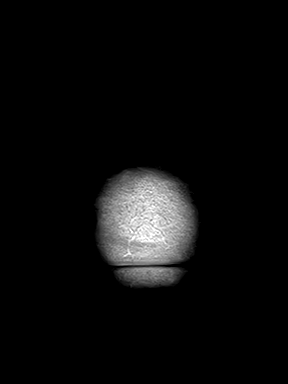
[im 33/33]
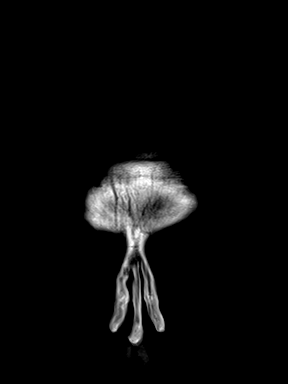

[Series 20: T1 post-contrast · sagittal · 5.0mm · 0.62mm/px · 1 of 25 slices shown (3 of 3)]
[im 1/25]
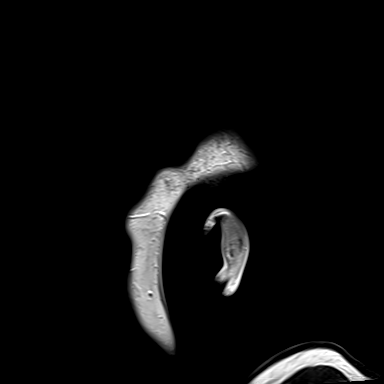

[48 of 48 positions shown; findings below may reference images not displayed]

FINDINGS: Brain: Sequelae of right frontal craniotomy are again identified for
resection of a large meningioma. Right frontal encephalomalacia and
a small amount of chronic blood products are again noted. Dural
thickening and enhancement subjacent to the craniotomy are similar
to the prior study and likely postoperative. No recurrent enhancing
mass is identified. Small T2 hyperintensities elsewhere in the
cerebral white matter bilaterally are unchanged and nonspecific but
compatible with mild chronic small vessel ischemic disease. There is
mild cerebral atrophy. No acute infarct, midline shift, or
significant extra-axial fluid collection is identified.

Vascular: Major intracranial vascular flow voids are preserved.

Skull and upper cervical spine: Right frontal craniotomy.

Sinuses/Orbits: Unremarkable orbits. Minimal mucosal thickening in
the ethmoid sinuses. Small bilateral mastoid effusions.

Other: None.
IMPRESSION: No evidence of recurrent tumor.

## 2021-05-02 MED ORDER — GADOBUTROL 1 MMOL/ML IV SOLN
10.0000 mL | Freq: Once | INTRAVENOUS | Status: AC | PRN
  Administered 2021-05-02: 10 mL via INTRAVENOUS

## 2021-05-07 ENCOUNTER — Other Ambulatory Visit: Payer: Self-pay | Admitting: Internal Medicine

## 2021-05-11 DIAGNOSIS — D320 Benign neoplasm of cerebral meninges: Secondary | ICD-10-CM | POA: Diagnosis not present

## 2021-05-11 DIAGNOSIS — Z6831 Body mass index (BMI) 31.0-31.9, adult: Secondary | ICD-10-CM | POA: Diagnosis not present

## 2021-05-11 DIAGNOSIS — I10 Essential (primary) hypertension: Secondary | ICD-10-CM | POA: Diagnosis not present

## 2021-05-16 ENCOUNTER — Ambulatory Visit: Payer: Medicare Other

## 2021-05-17 ENCOUNTER — Telehealth: Payer: Self-pay

## 2021-05-17 ENCOUNTER — Other Ambulatory Visit: Payer: Self-pay

## 2021-05-17 ENCOUNTER — Ambulatory Visit (INDEPENDENT_AMBULATORY_CARE_PROVIDER_SITE_OTHER): Payer: Medicare Other

## 2021-05-17 ENCOUNTER — Ambulatory Visit: Payer: Medicare Other | Admitting: Pharmacist

## 2021-05-17 VITALS — Temp 97.2°F

## 2021-05-17 DIAGNOSIS — Z79899 Other long term (current) drug therapy: Secondary | ICD-10-CM

## 2021-05-17 DIAGNOSIS — E559 Vitamin D deficiency, unspecified: Secondary | ICD-10-CM

## 2021-05-17 DIAGNOSIS — N1832 Chronic kidney disease, stage 3b: Secondary | ICD-10-CM

## 2021-05-17 DIAGNOSIS — Z23 Encounter for immunization: Secondary | ICD-10-CM

## 2021-05-17 DIAGNOSIS — I10 Essential (primary) hypertension: Secondary | ICD-10-CM

## 2021-05-17 DIAGNOSIS — N138 Other obstructive and reflux uropathy: Secondary | ICD-10-CM

## 2021-05-17 DIAGNOSIS — E1169 Type 2 diabetes mellitus with other specified complication: Secondary | ICD-10-CM

## 2021-05-17 DIAGNOSIS — M1A00X0 Idiopathic chronic gout, unspecified site, without tophus (tophi): Secondary | ICD-10-CM

## 2021-05-17 DIAGNOSIS — N1831 Chronic kidney disease, stage 3a: Secondary | ICD-10-CM

## 2021-05-17 DIAGNOSIS — G4733 Obstructive sleep apnea (adult) (pediatric): Secondary | ICD-10-CM

## 2021-05-17 DIAGNOSIS — N2889 Other specified disorders of kidney and ureter: Secondary | ICD-10-CM

## 2021-05-17 DIAGNOSIS — K219 Gastro-esophageal reflux disease without esophagitis: Secondary | ICD-10-CM

## 2021-05-17 DIAGNOSIS — E785 Hyperlipidemia, unspecified: Secondary | ICD-10-CM

## 2021-05-17 DIAGNOSIS — R569 Unspecified convulsions: Secondary | ICD-10-CM

## 2021-05-17 DIAGNOSIS — Z9989 Dependence on other enabling machines and devices: Secondary | ICD-10-CM

## 2021-05-17 DIAGNOSIS — E782 Mixed hyperlipidemia: Secondary | ICD-10-CM

## 2021-05-17 DIAGNOSIS — F419 Anxiety disorder, unspecified: Secondary | ICD-10-CM

## 2021-05-17 NOTE — Progress Notes (Signed)
Chronic Care Management Pharmacy Note  05/17/2021 Name:  Jeffery Reed MRN:  [id number] DOB:  1946-06-20  Summary: Patient is doing well. Working on making lifestyle modifications for HLD control  Recommendations/Changes made from today's visit: -Discussed lifestyle modifications in detail    Subjective: Jeffery Reed is an 75 y.o. year old male who is a primary patient of Unk Pinto, MD.  The CCM team was consulted for assistance with disease management and care coordination needs.    Engaged with patient face to face for initial visit in response to provider referral for pharmacy case management and/or care coordination services.   Consent to Services:  The patient was given information about Chronic Care Management services, agreed to services, and gave verbal consent prior to initiation of services.  Please see initial visit note for detailed documentation.   Patient Care Team: Unk Pinto, MD as PCP - General (Internal Medicine) Franchot Gallo, MD as Consulting Physician (Urology) Jamal Maes, MD as Consulting Physician (Nephrology) Rush Landmark, [facility] as Pharmacist (Pharmacist) Frann Rider, NP as Nurse Practitioner (Neurology) Garvin Fila, MD as Referring Physician (Neurology)  Recent office visits: 03/03/2021- Dr. Melford Aase- Patient was seen for a routine follow up. No medication changes.   Recent consult visits: 04/21/2021- Dr. Cruzita Lederer (internal med)- Patient was seen for diabetes and HLD. Changed Dulaglutide to 1.31m subq weekly.    03/08/2021- Dr. DBrett Fairy(neurology)- Patient was seen to follow up on sleep apnea. No medication changes.   Hospital visits: None in previous 6 months   Objective:  Lab Results  Component Value Date   CREATININE 1.97 (H) 03/03/2021   BUN 24 03/03/2021   GFRNONAA 37 (L) 10/25/2020   GFRAA 42 (L) 10/25/2020   NA 142 03/03/2021   K 4.1 03/03/2021   CALCIUM 9.5 03/03/2021   CO2 26 03/03/2021   GLUCOSE  133 (H) 03/03/2021    Lab Results  Component Value Date/Time   HGBA1C 6.0 (H) 03/03/2021 02:56 PM   HGBA1C 6.7 (A) 01/19/2021 10:19 AM   HGBA1C 5.7 (A) 09/20/2020 10:04 AM   HGBA1C 6.3 (H) 03/02/2020 02:51 PM   FRUCTOSAMINE 232 09/23/2020 11:40 AM   FRUCTOSAMINE 324 (H) 01/14/2020 12:43 PM   MICROALBUR 0.4 03/03/2021 02:56 PM   MICROALBUR 0.9 03/02/2020 02:51 PM    Last diabetic Eye exam:  Lab Results  Component Value Date/Time   HMDIABEYEEXA Retinopathy (A) 12/30/2020 12:00 AM    Last diabetic Foot exam: No results found for: HMDIABFOOTEX   Lab Results  Component Value Date   CHOL 165 03/03/2021   HDL 30 (L) 03/03/2021   LDLCALC 98 03/03/2021   TRIG 243 (H) 03/03/2021   CHOLHDL 5.5 (H) 03/03/2021    Hepatic Function Latest Ref Rng & Units 03/03/2021 10/25/2020 10/16/2020  Total Protein 6.1 - 8.1 g/dL 6.7 6.6 6.2(L)  Albumin 3.5 - 5.0 g/dL - - 3.7  AST 10 - 35 U/L _0 ALT 9 - 46 U/L _1 Alk Phosphatase 38 - 126 U/L - - 31(L)  Total Bilirubin 0.2 - 1.2 mg/dL 0.4 0.4 1.0  Bilirubin, Direct 0.0 - 0.2 mg/dL - - -    Lab Results  Component Value Date/Time   TSH 5.08 (H) 03/03/2021 02:56 PM   TSH 3.53 09/23/2020 11:40 AM    CBC Latest Ref Rng & Units 03/03/2021 10/25/2020 10/16/2020  WBC 3.8 - 10.8 Thousand/uL 7.4 7.0 7.3  Hemoglobin 13.2 - 17.1 g/dL 14.0 13.6 12.9(L)  Hematocrit 38.5 -  50.0 % 41.3 40.1 38.9(L)  Platelets 140 - 400 Thousand/uL 202 204 151    Lab Results  Component Value Date/Time   VD25OH 72 03/03/2021 02:56 PM   VD25OH 48 09/23/2020 11:40 AM    Clinical ASCVD: Yes  The 10-year ASCVD risk score (Arnett DK, et al., 2019) is: 47%   Values used to calculate the score:     Age: 38 years     Sex: Male     Is Non-Hispanic African American: No     Diabetic: Yes     Tobacco smoker: No     Systolic Blood Pressure: 633 mmHg     Is BP treated: Yes     HDL Cholesterol: 30 mg/dL     Total Cholesterol: 165 mg/dL    Depression screen [facility] 2/9  03/06/2021 10/24/2020 09/25/2020  Decreased Interest 0 0 0  Down, Depressed, Hopeless 0 0 0  PHQ - 2 Score 0 0 0     See below as applicable Other: ([id number] if Afib, MMRC or CAT for COPD, ACT, DEXA)  Social History   Tobacco Use  Smoking Status Never  Smokeless Tobacco Never   BP Readings from Last 3 Encounters:  04/21/21 128/84  03/08/21 129/78  03/06/21 118/78   Pulse Readings from Last 3 Encounters:  04/21/21 60  03/08/21 66  03/06/21 77   Wt Readings from Last 3 Encounters:  04/21/21 222 lb 9.6 oz (101 kg)  03/08/21 218 lb 8 oz (99.1 kg)  03/06/21 218 lb 12.8 oz (99.2 kg)   BMI Readings from Last 3 Encounters:  04/21/21 31.94 kg/m  03/08/21 31.35 kg/m  03/06/21 31.39 kg/m    Assessment/Interventions: Review of patient past medical history, allergies, medications, health status, including review of consultants reports, laboratory and other test data, was performed as part of comprehensive evaluation and provision of chronic care management services.   SDOH:  (Social Determinants of Health) assessments and interventions performed: Yes  SDOH Screenings   Alcohol Screen: Not on file  Depression (PHQ2-9): Low Risk    PHQ-2 Score: 0  Financial Resource Strain: Not on file  Food Insecurity: Not on file  Housing: Not on file  Physical Activity: Not on file  Social Connections: Not on file  Stress: Not on file  Tobacco Use: Low Risk    Smoking Tobacco Use: Never   Smokeless Tobacco Use: Never  Transportation Needs: Not on file    [place]  Allergies  Allergen Reactions   Cardura [Doxazosin Mesylate] Other (See Comments)    Nasal congestion   Codeine Other (See Comments)    The patient passed out   Hytrin [Terazosin] Other (See Comments)    Reaction not recalled    Medications Reviewed Today     Reviewed by Lauralyn Primes, [place] (Registered Medical Assistant) on 04/21/21 at [place] List Status: <None>   Medication Order Taking? Sig  Documenting Provider Last Dose Status Informant  acetaminophen (TYLENOL) 650 MG CR tablet [id number] Yes Take 650 mg by mouth every 8 (eight) hours as needed for pain (or headaches). [provider] Taking Active Self  Cholecalciferol (VITAMIN D3) 10000 units capsule [id number] Yes Take 10,000 Units by mouth at bedtime.  [provider] Taking Active Self  citalopram (CELEXA) 20 MG tablet [id number] Yes TAKE 1 TABLET BY MOUTH DAILY FOR Champ Mungo, NP Taking Active   Dulaglutide (TRULICITY) 8.45 XM/4.6OE SOPN [id number] Yes ADMINISTER 0.75 MG UNDER THE SKIN WEEKLY IN THE MORNING Philemon Kingdom,  MD Taking Active   fenofibrate 160 MG tablet [id number] Yes Take      1 tablet      Daily       for Triglycerides (Blood Fats)  Patient taking differently: Take      1 tablet      Daily       for Triglycerides (Blood Fats)   Corbett, Ashley, NP Taking Active   fexofenadine (ALLEGRA) 180 MG tablet [id number] Yes Take 180 mg by mouth daily as needed for allergies or rhinitis. [provider] Taking Active Self  fluticasone (FLONASE) 50 MCG/ACT nasal spray [id number] Yes Place 2 sprays into both nostrils daily as needed for allergies or rhinitis. Alekh, Kshitiz, MD Taking Active   glipiZIDE (GLUCOTROL) 5 MG tablet [id number] Yes Take 1-2 tablets (5-10 mg total) by mouth 2 (two) times daily before a meal. Gherghe, Cristina, MD Taking Active   glucose blood (FREESTYLE LITE) test strip [id number] Yes Check blood sugar 1 time daily-DX-E11.22 Collier, Amanda R, PA-C Taking Active Self  levETIRAcetam (KEPPRA) 500 MG tablet [id number] Yes Take 1 tablet (500 mg total) by mouth 2 (two) times daily. McCue, Jessica, NP Taking Active   metFORMIN (GLUCOPHAGE) 500 MG tablet [id number] Yes Take 1 tablet (500 mg total) by mouth daily with supper. Gherghe, Cristina, MD Taking Active   mupirocin ointment (BACTROBAN) 2 % [id number] Yes APPLY TWICE A DAY TO SKIN INFECTION. McKeown, William, MD Taking Active  Self  omeprazole (PRILOSEC) 40 MG capsule [id number] Yes Take     1 capsule     Daily      to Prevent Indigestion & Heartburn  Patient taking differently: Take     1 capsule     Daily      to Prevent Indigestion & Heartburn   Corbett, Ashley, NP Taking Active   polyethylene glycol (MIRALAX / GLYCOLAX) 17 g packet [id number] Yes Take 17 g by mouth daily as needed for mild constipation. Hall, Carole N, DO Taking Active Self  pyridOXINE (B-6) 50 MG tablet [id number] Yes Take 1 tablet (50 mg total) by mouth daily. Alekh, Kshitiz, MD Taking Active   rosuvastatin (CRESTOR) 10 MG tablet [id number] Yes Take  1 tablet  Daily  for Cholesterol McKeown, William, MD Taking Active   valsartan (DIOVAN) 160 MG tablet [id number] Yes Take  1 tablet  Daily  for BP & Diabetic Kidney Protection  / Patient knows to take by mouth  ! McKeown, William, MD Taking Active             Patient Active Problem List   Diagnosis Date Noted   Loud snoring 03/08/2021   History of sleep apnea 03/08/2021   History of resection of meningioma 03/08/2021   Retrognathia 03/08/2021   Seizure-like activity (HCC) 10/15/2020   Acute-on-chronic kidney injury (HCC) 10/15/2020   Seizure (HCC) 10/14/2020   Renal mass 01/08/2020   Noncompliance with CPAP treatment    Stage 3 chronic kidney disease (HCC)    Steroid-induced hyperglycemia    Enlarged prostate    CKD stage 3 due to type 2 diabetes mellitus (HCC) 03/12/2019   Anxiety 02/18/2018   GERD (gastroesophageal reflux disease) 03/12/2016   Obesity (BMI 30.0-34.9) 06/01/2015   Medication management 08/19/2014   BPH/Prostatism 08/19/2014   Essential hypertension 08/17/2013   Type 2 diabetes mellitus with stage 3 chronic kidney disease, without long-term current use of insulin (HCC) 08/17/2013   Vitamin D deficiency 08/17/2013   Hyperlipidemia associated with type 2 diabetes mellitus (HCC)      Testosterone deficiency    Gout    OSA (obstructive sleep apnea)     Immunization  History  Administered Date(s) Administered   DT (Pediatric) 06/01/2015   Fluad Quad(high Dose 65+) 05/20/2020   Influenza Whole 06/05/2013   Influenza, High Dose Seasonal PF 06/05/2014, 06/01/2015, 06/14/2016, 06/22/2017, 06/11/2018, 05/13/2019   PFIZER(Purple Top)SARS-COV-2 Vaccination 09/27/2019, 10/20/2019   Pneumococcal Conjugate-13 08/19/2014   Pneumococcal Polysaccharide-23 08/22/2003, 12/10/2015   Tdap 03/08/2004   Zoster, Live 08/18/2013    Conditions to be addressed/monitored:  Hypertension, Hyperlipidemia, Diabetes, GERD, Chronic Kidney Disease, Anxiety, BPH, Gout, and OSA, vitamin D def, seizures  There are no care plans that you recently modified to display for this patient.    Medication Assistance: None required.  Patient affirms current coverage meets needs.  Compliance/Adherence/Medication fill history: Care Gaps: Zoster, Covid, and Influenza vaccine  Star-Rating Drugs: Valsartan- 90ds, 04/03/2021- [facility] [address] Rosuvastatin- 90ds, 01/25/2021 - [facility] [address]   Patient's preferred pharmacy is:  [id number] - [place], [place] - [address] AT [address] OF [address] [place] [address] Phone: [phone] Fax: [phone]  [facility] - [place], [place] - [address] [place] [address] Phone: [phone] Fax: [phone]  [facility] - [place], [place] - [address] [place] [address] Phone: [phone] Fax: [phone]  Uses pill box? Yes Pt endorses 100% compliance  We discussed: Current pharmacy is preferred with insurance plan and patient is satisfied with pharmacy services Patient decided to: Continue current medication management strategy  Care Plan and Follow Up Patient Decision:  Patient agrees to Care Plan and  Follow-up.  Plan: Telephone follow up appointment with care management team member scheduled for:  10/04/2021     Current Barriers:  Lifestyle modifications  Pharmacist Clinical Goal(s):  Patient will achieve adherence to monitoring guidelines and medication adherence to achieve therapeutic efficacy through collaboration with PharmD and provider.   Interventions: 1:1 collaboration with McKeown, William, MD regarding development and update of comprehensive plan of care as evidenced by provider attestation and co-signature Inter-disciplinary care team collaboration (see longitudinal plan of care) Comprehensive medication review performed; medication list updated in electronic medical record  Hypertension (BP goal <130/80) -Controlled -Current treatment: Valsartan 160 mg daily (appropriate, effective, safe, accessible) -Medications previously tried: N/A  -Current home readings: Checking BID but not logging. Usually at goal but some elevated readings (patient also has uncontrolled anxiety) -Current dietary habits: Limiting salt -Current exercise habits: Limited due to COPD and neuropathy in both feet but goes for walks with neighbor occasionally -Denies hypotensive/hypertensive symptoms -Educated on BP goals and benefits of medications for prevention of heart attack, stroke and kidney damage; Daily salt intake goal < 2300 mg; Importance of home blood pressure monitoring; Proper BP monitoring technique; Symptoms of hypotension and importance of maintaining adequate hydration; -Counseled to monitor BP at home 16 days per month using RPM BP monitor -Patient has OSA and using a CPAP machine -Counseled on diet and exercise extensively Recommended to continue current medication Counseled on importance of lifestyle modifications  Hyperlipidemia: (LDL goal < 70) -Uncontrolled -Current treatment: Rosuvastatin 10 mg daily (appropriate, query effective) Fenofibrate 160 mg daily  (appropriate, query effective) -Medications previously tried: N/A  -Current dietary patterns: No restrictions -Current exercise habits: Limited but walks on occasion -Educated   on Cholesterol goals;  Importance of limiting foods high in cholesterol; Exercise goal of 150 minutes per week; -Counseled on diet and exercise extensively Recommended to continue current medication Counseled on lifestyle modifications in detail  Diabetes (A1c goal <7%) -Controlled -Current medications: Metformin 500 mg tablet daily (appropriate, effective, safe, accessible) Trulicity 3mg/0.5ml inject weekly (appropriate, effective, safe, accessible) Glipizide 5 mg 1 to 2 tablets TID with meals for diabetes (appropriate, effective, safe, accessible) -Medications previously tried: N/A  Followed by endocrinologist A1C: 6.7 9/1 Going to ophthalmologist yearly -Current home glucose readings fasting glucose: 100-110 post prandial glucose: high can be 240 -Denies hypoglycemic/hyperglycemic symptoms -Current meal patterns:  Limiting carbs -Current exercise: Limited due to other health concerns -Educated on A1c and blood sugar goals; Complications of diabetes including kidney damage, retinal damage, and cardiovascular disease; Benefits of weight loss; Prevention and management of hypoglycemic episodes; Benefits of routine self-monitoring of blood sugar; Counseled to check feet daily and get yearly eye exams -Counseled to check feet daily and get yearly eye exams -Counseled on diet and exercise extensively Recommended to continue current medication  Anxiety -Controlled -Current treatment: Citalopram 20 mg daily (appropriate, effective, safe, accessible) -Medications previously tried/failed: N/A -GAD7: 3 (minimal anxiety) -Educated on Benefits of medication for symptom control Benefits of cognitive-behavioral therapy with or without medication -Recommended to continue current medication  GERD   -Controlled -Current treatment  Omeprazole 40 mg daily (appropriate, effective, safe, accessible) -Medications previously tried: N/A  -Counseled on diet and exercise extensively Recommended to continue current medication Not a candidate for step down therapy at this time  Chronic Kidney Disease Stage 3b  -All medications assessed for renal dosing and appropriateness in chronic kidney disease. -Counseled on diet and exercise extensively Recommended to continue current medication Benefits of controlling BG, BP, avoiding NSAIDS and salt  BPH -Controlled -Current treatment  None Using the bathroom three times a night Followed by urologist Does not express any concerns at this time -Medications previously tried: N/A  -Patient to call if anything changes  GOUT  -Controlled -Current treatment  None -Medications previously tried: N/A  -Counseled on diet and exercise extensively Recommended to continue current medication    Health Maintenance -Vaccine gaps: Discussed vaccine gaps with patient Receiving influenza vaccine today Vitamin d deficiency 03/03/2021: 72   Patient Goals/Self-Care Activities Patient will:  - take medications as prescribed as evidenced by patient report and record review  Follow Up Plan: Telephone follow up appointment with care management team member scheduled for: 10/04/2021  

## 2021-05-17 NOTE — Progress Notes (Signed)
    Chronic Care Management Pharmacy Assistant   Name: Jeffery Reed  MRN: [id number] DOB: [birth year]   Reason for Encounter: CCM Initial Chart Prep- 9/27 appt with [name]   Conditions to be addressed/monitored: HTN, HLD, CKD Stage 3, Anxiety, and OSA, GERD, BPH, Testosterone Deficiency, Gout, Vitamin D deficiency   Recent office visits:  03/03/2021- Dr. Melford Aase- Patient was seen for a routine follow up. No medication changes.   Recent consult visits:  04/21/2021- Dr. Cruzita Lederer (internal med)- Patient was seen for diabetes and HLD. Changed Dulaglutide to 1.5mg  subq weekly.   03/08/2021- Dr. Brett Fairy (neurology)- Patient was seen to follow up on sleep apnea. No medication changes.  Hospital visits:  None in previous 6 months  Medications: Outpatient Encounter Medications as of 05/17/2021  Medication Sig   acetaminophen (TYLENOL) 650 MG CR tablet Take 650 mg by mouth every 8 (eight) hours as needed for pain (or headaches).   Cholecalciferol (VITAMIN D3) 10000 units capsule Take 10,000 Units by mouth at bedtime.    citalopram (CELEXA) 20 MG tablet TAKE 1 TABLET BY MOUTH DAILY FOR MOOD   Dulaglutide (TRULICITY) 1.5 QI/2.9NL SOPN Inject 1.5 mg into the skin once a week.   fenofibrate 160 MG tablet TAKE 1 TABLET BY MOUTH  DAILY FOR TRIGLYCERIDES  (BLOOD FATS)   fexofenadine (ALLEGRA) 180 MG tablet Take 180 mg by mouth daily as needed for allergies or rhinitis.   fluticasone (FLONASE) 50 MCG/ACT nasal spray Place 2 sprays into both nostrils daily as needed for allergies or rhinitis.   glipiZIDE (GLUCOTROL) 5 MG tablet Take 1-2 tablets (5-10 mg total) by mouth 2 (two) times daily before a meal.   glucose blood (FREESTYLE LITE) test strip Check blood sugar 1 time daily-DX-E11.22   levETIRAcetam (KEPPRA) 500 MG tablet Take 1 tablet (500 mg total) by mouth 2 (two) times daily.   metFORMIN (GLUCOPHAGE) 500 MG tablet Take 1 tablet (500 mg total) by mouth daily with supper.   mupirocin ointment  (BACTROBAN) 2 % APPLY TWICE A DAY TO SKIN INFECTION.   omeprazole (PRILOSEC) 40 MG capsule TAKE 1 CAPSULE BY MOUTH  DAILY TO PREVENT  INDIGESTION & HEARTBURN   polyethylene glycol (MIRALAX / GLYCOLAX) 17 g packet Take 17 g by mouth daily as needed for mild constipation.   pyridOXINE (B-6) 50 MG tablet Take 1 tablet (50 mg total) by mouth daily.   rosuvastatin (CRESTOR) 10 MG tablet Take  1 tablet  Daily  for Cholesterol   valsartan (DIOVAN) 160 MG tablet Take  1 tablet  Daily  for BP & Diabetic Kidney Protection  / Patient knows to take by mouth  !   No facility-administered encounter medications on file as of 05/17/2021.    Care Gaps: Zoster, Covid, and Influenza vaccine  Star Rating Drugs: Valsartan- 90ds, 04/03/2021- [facility] [address], 01/25/2021 - [facility] [address]  SIG: [place]  [phone]

## 2021-06-02 ENCOUNTER — Ambulatory Visit (INDEPENDENT_AMBULATORY_CARE_PROVIDER_SITE_OTHER): Payer: Medicare Other | Admitting: Neurology

## 2021-06-02 ENCOUNTER — Other Ambulatory Visit: Payer: Self-pay

## 2021-06-02 DIAGNOSIS — Z9189 Other specified personal risk factors, not elsewhere classified: Secondary | ICD-10-CM

## 2021-06-02 DIAGNOSIS — M2619 Other specified anomalies of jaw-cranial base relationship: Secondary | ICD-10-CM

## 2021-06-02 DIAGNOSIS — Z8603 Personal history of neoplasm of uncertain behavior: Secondary | ICD-10-CM

## 2021-06-02 DIAGNOSIS — Z8669 Personal history of other diseases of the nervous system and sense organs: Secondary | ICD-10-CM

## 2021-06-02 DIAGNOSIS — G4731 Primary central sleep apnea: Secondary | ICD-10-CM

## 2021-06-02 DIAGNOSIS — R569 Unspecified convulsions: Secondary | ICD-10-CM

## 2021-06-02 DIAGNOSIS — Z9889 Other specified postprocedural states: Secondary | ICD-10-CM

## 2021-06-09 NOTE — Progress Notes (Deleted)
MEDICARE ANNUAL WELLNESS VISIT AND FOLLOW UP Assessment:     Medicare annual wellness visit  Due Annually  Health Maintenance Reviewed  Healthy lifestyle reviewed and goals set   Essential hypertension -typically fairly controlled recently, mild elevation today, recheck on Monday by [place] with labs -cont meds -dash diet -monitor at home  OSA (obstructive sleep apnea) -cont use of cpap machine -sleep hygiene -lose weight  Type II diabetes mellitus with nephropathy (HCC) -Severely elevated fasting values, he admits diet has been poor since mother passed On max dose metformin; increase glipizide to TID and advised to take 2 tabs of glipizide in AM if fasting glucose is 150+; resume TID if fasting consistently <150, and if having any hypoglycemic episodes resume 5 mg BID as currently taking He plans to work aggressively on diet Follow up in 1 month for diabetes pending A1C results -cont diet and exercise - Hemoglobin A1c  BPH/Prostatism -cont meds -currently without change from prior visit -followed by urology   Hyperlipidemia -cont meds -diet and exercise - Lipid panel - CMP   Idiopathic gout, unspecified chronicity, unspecified site Gout- recheck Uric acid as needed, doing well off of allopurinol Diet discussed   Vitamin D deficiency -cont supplement, suggested he continue current dose but to take at least 4 hours apart from PPI, defer recheck to next visit - goal 60-100 - Vitamin D   Medication management - CBC with Differential/Platelet - CMP/GFR -Magnesium  Obesity - BMI 30-34 Long discussion about weight loss, diet, and exercise Recommended diet heavy in fruits and veggies and low in animal meats, cheeses, and dairy products, appropriate calorie intake Discussed appropriate weight for height  Follow up at next visit   Abnormal Thyroid Blood Test TSH  Anxiety Previously was doing well with PRN xanax, use is up significantly since mother's passing;  discussed daily agent today which he declines, he understands risks of tolerance and addiction with daily use of xanax and will work on tapering down to <5 days a week and lower doses when possible Stress management techniques discussed, increase water, good sleep hygiene discussed, increase exercise, and increase veggies.      Over 30 minutes of exam, counseling, chart review, and critical decision making was performed Future Appointments  Date Time Provider [place]  06/14/2021 11:30 AM Magda Bernheim, NP [facility] None  07/06/2021  8:45 AM Frann Rider, NP [facility] None  08/25/2021 10:20 AM Philemon Kingdom, MD [facility] None  09/13/2021  9:30 AM Unk Pinto, MD [facility] None  10/04/2021 11:00 AM Rush Landmark, RPH [facility] None    Plan:   During the course of the visit the patient was educated and counseled about appropriate screening and preventive services including:   Pneumococcal vaccine  Influenza vaccine Prevnar 13 Td vaccine Screening electrocardiogram Colorectal cancer screening Diabetes screening Glaucoma screening Nutrition counseling    Subjective:  Jeffery Reed is a 75 y.o. male who presents for Medicare Annual Wellness Visit and 3 month follow up for HTN, hyperlipidemia, T2 diabetes, and vitamin D Def.   he has a diagnosis of anxiety and is currently prescribed xanax 0.5-1 mg TID PRN, reports symptoms are well controlled on current regimen. he currently takes 1/2-1 tab twice daily, in the last few weeks, but previously would go some weeks without taking at all.   Both mother and father passed away this past year  He has BPH/prostatism followed by urology.   BMI is There is no height or weight on file to calculate  BMI., he admits exercise has been limited, not eating as well since mother passed.  Wt Readings from Last 3 Encounters:  04/21/21 222 lb 9.6 oz (101 kg)  03/08/21 218 lb 8 oz (99.1 kg)  03/06/21 218 lb 12.8 oz (99.2 kg)   His  blood pressure has been controlled at home, today their BP is   He does not workout. He denies chest pain, shortness of breath, dizziness.   He is on cholesterol medication (rosuvastatin 10 mg daily) and denies myalgias. His cholesterol is not at goal. The cholesterol last visit was:   Lab Results  Component Value Date   CHOL 165 03/03/2021   HDL 30 (L) 03/03/2021   LDLCALC 98 03/03/2021   TRIG 243 (H) 03/03/2021   CHOLHDL 5.5 (H) 03/03/2021   He has been working on diet and exercise for diabetes with CKD, and denies foot ulcerations, hyperglycemia, hypoglycemia , increased appetite, nausea, paresthesia of the feet, polydipsia, polyuria, visual disturbances, vomiting and weight loss. He checks random fasting glucoses; has been running 200-250 since his mother passed for the past month, currently on metformin 2000 mg and glipizide 5 mg twice daily. Last A1C in the office was:   Lab Results  Component Value Date   HGBA1C 6.0 (H) 03/03/2021   Last GFR Lab Results  Component Value Date   GFRNONAA 37 (L) 10/25/2020    Patient is on Vitamin D supplement, taking 10000 IU, taking in AM with PPI Lab Results  Component Value Date   VD25OH 72 03/03/2021       Medication Review: Current Outpatient Medications on File Prior to Visit  Medication Sig Dispense Refill   acetaminophen (TYLENOL) 650 MG CR tablet Take 650 mg by mouth every 8 (eight) hours as needed for pain (or headaches).     Cholecalciferol (VITAMIN D3) 10000 units capsule Take 10,000 Units by mouth at bedtime. Taking 5,000 IU QOD, alternating with 10,000     citalopram (CELEXA) 20 MG tablet TAKE 1 TABLET BY MOUTH DAILY FOR MOOD 90 tablet 3   Dulaglutide (TRULICITY) 1.5 VO/3.5KK SOPN Inject 1.5 mg into the skin once a week. 6 mL 3   fenofibrate 160 MG tablet TAKE 1 TABLET BY MOUTH  DAILY FOR TRIGLYCERIDES  (BLOOD FATS) 90 tablet 3   fexofenadine (ALLEGRA) 180 MG tablet Take 180 mg by mouth daily as needed for allergies or  rhinitis.     fluticasone (FLONASE) 50 MCG/ACT nasal spray Place 2 sprays into both nostrils daily as needed for allergies or rhinitis.     glipiZIDE (GLUCOTROL) 5 MG tablet Take 1-2 tablets (5-10 mg total) by mouth 2 (two) times daily before a meal. 360 tablet 3   glucose blood (FREESTYLE LITE) test strip Check blood sugar 1 time daily-DX-E11.22 100 each 5   levETIRAcetam (KEPPRA) 500 MG tablet Take 1 tablet (500 mg total) by mouth 2 (two) times daily. 180 tablet 3   metFORMIN (GLUCOPHAGE) 500 MG tablet Take 1 tablet (500 mg total) by mouth daily with supper. 90 tablet 3   mupirocin ointment (BACTROBAN) 2 % APPLY TWICE A DAY TO SKIN INFECTION. 66 g 1   omeprazole (PRILOSEC) 40 MG capsule TAKE 1 CAPSULE BY MOUTH  DAILY TO PREVENT  INDIGESTION & HEARTBURN 90 capsule 3   pyridOXINE (B-6) 50 MG tablet Take 1 tablet (50 mg total) by mouth daily. 30 tablet 0   rosuvastatin (CRESTOR) 10 MG tablet Take  1 tablet  Daily  for Cholesterol 90 tablet 3  valsartan (DIOVAN) 160 MG tablet Take  1 tablet  Daily  for BP & Diabetic Kidney Protection  / Patient knows to take by mouth  ! 90 tablet 1   No current facility-administered medications on file prior to visit.    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 mellitus ([place]) 03/12/2019   Anxiety 02/18/2018   GERD (gastroesophageal reflux disease) 03/12/2016   Obesity (BMI 30.0-34.9) 06/01/2015   Medication management 08/19/2014   BPH/Prostatism 08/19/2014   Essential  hypertension 08/17/2013   Type 2 diabetes mellitus with stage 3 chronic kidney disease, without long-term current use of insulin ([place]) 08/17/2013   Vitamin D deficiency 08/17/2013   Hyperlipidemia associated with type 2 diabetes mellitus ([place])    Testosterone deficiency    Gout    OSA (obstructive sleep apnea)     Screening Tests Immunization History  Administered Date(s) Administered   DT (Pediatric) 06/01/2015   Fluad Quad(high Dose 65+) 05/20/2020   Influenza Whole 06/05/2013   Influenza, High Dose Seasonal PF 06/05/2014, 06/01/2015, 06/14/2016, 06/22/2017, 06/11/2018, 05/13/2019, 05/17/2021   PFIZER(Purple Top)SARS-COV-2 Vaccination 09/27/2019, 10/20/2019   Pneumococcal Conjugate-13 08/19/2014   Pneumococcal Polysaccharide-23 08/22/2003, 12/10/2015   Tdap 03/08/2004   Zoster, Live 08/18/2013   Preventative care: Last colonoscopy: 01/2019, 3 polyps removed, due 01/2022 Ct head 2010 CXR 2010  Prior vaccinations: TD or Tdap: 2016  Influenza: 2019  Pneumococcal: 2017 Prevnar13: 2015 Shingles/Zostavax: 2014  Names of Other Physician/Practitioners you currently use: 1. [facility] here for primary care 2. [facility], eye doctor, last visit 09/10/2018 - in chart and abstracted 3. Dr. Leonides Sake, dentist, 2020  Patient Care Team: Unk Pinto, MD as PCP - General (Internal Medicine) Franchot Gallo, MD as Consulting Physician (Urology) Jamal Maes, MD as Consulting Physician (Nephrology) Rush Landmark, [facility] as Pharmacist (Pharmacist) Frann Rider, NP as Nurse Practitioner (Neurology) Garvin Fila, MD as Referring Physician (Neurology)  Allergies Allergies  Allergen Reactions   Cardura [Doxazosin Mesylate] Other (See Comments)    Nasal congestion   Codeine Other (See Comments)    The patient passed out   Hytrin [Terazosin] Other (See Comments)    Reaction not recalled    SURGICAL HISTORY He  has a past surgical history  that includes Foot surgery (10/2009); Tonsillectomy (1953); Myringotomy (Left, 02-2005); Colonoscopy; and Craniotomy (Right, 12/29/2019). FAMILY HISTORY His family history includes Diabetes in his maternal grandmother and paternal grandmother; GER disease in his mother; Hyperlipidemia in his father; Hypertension in his brother, father, and mother; Parkinson's disease in his father; Stroke in his paternal grandfather and paternal grandmother. SOCIAL HISTORY He  reports that he has never smoked. He has never used smokeless tobacco. He reports that he does not drink alcohol and does not use drugs.  MEDICARE WELLNESS OBJECTIVES: Physical activity:   Cardiac risk factors:   Depression/mood screen:   Depression screen [facility] 2/9 03/06/2021  Decreased Interest 0  Down, Depressed, Hopeless 0  PHQ - 2 Score 0  ADLs:  In your present state of health, do you have any difficulty performing the following activities: 03/06/2021 10/24/2020  Hearing? N N  Vision? N N  Difficulty concentrating or making decisions? N N  Walking or climbing stairs? N N  Dressing or bathing? N N  Doing errands, shopping? N N  Some recent data might be hidden     Cognitive Testing  Alert? Yes  Normal Appearance?Yes  Oriented to person? Yes  Place? Yes   Time? Yes  Recall of three objects?  Yes  Can perform simple calculations? Yes  Displays appropriate judgment?Yes  Can read the correct time from a watch face?Yes  EOL planning:     Objective:   There were no vitals filed for this visit.  There is no height or weight on file to calculate BMI.  General : Well sounding patient in no apparent distress HEENT: no hoarseness, no cough for duration of visit Lungs: speaks in complete sentences, no audible wheezing, no apparent distress Neurological: alert, oriented x 3 Psychiatric: pleasant, judgement appropriate   Medicare Attestation I have personally reviewed: The patient's medical and social history Their use of  alcohol, tobacco or illicit drugs Their current medications and supplements The patient's functional ability including ADLs,fall risks, home safety risks, cognitive, and hearing and visual impairment Diet and physical activities Evidence for depression or mood disorders  The patient's weight, height, BMI, and visual acuity have been recorded in the chart.  I have made referrals, counseling, and provided education to the patient based on review of the above and I have provided the patient with a written personalized care plan for preventive services.     Magda Bernheim, NP   06/09/2021

## 2021-06-10 DIAGNOSIS — Z9189 Other specified personal risk factors, not elsewhere classified: Secondary | ICD-10-CM | POA: Insufficient documentation

## 2021-06-10 NOTE — Progress Notes (Signed)
DIAGNOSIS 1. Complex Sleep Apnea responded well to 8 cm water pressure on CPAP, no EPR was used and no oxygen was needed.    PLANS/RECOMMENDATIONS: 1. Auto CPAP capable device with a setting from 6 through 13 cm water, no EPR, heated humidification and an ESON II mask in large size.  CPAP therapy compliance is defined as 4 hours or more of nightly use.  2. Any apnea patient should avoid sedatives, hypnotics, and alcohol consumption. Apnea and snoring will increase under these substances.    DISCUSSION: A follow up appointment will be scheduled in the Sleep Clinic with Frann Rider  at [facility].   Please call [phone] with any questions.

## 2021-06-10 NOTE — Procedures (Signed)
PATIENT'S NAME:  Jeffery Reed, Jeffery Reed DOB:      1945-10-29      MR#:    [id number]     DATE OF RECORDING: 06/02/2021 Jeffery Reed REFERRING JefferyD.:  Jeffery Contras, MD Study Performed:   CPAP  Titration HISTORY:   Jeffery Reed is a 76.year old patient of Dr Clydene Fake, has undergone meningioma surgery 2021, and developed a seizure post surgery. The patient is referred to establish CPAP care- He was previously followed by [facility].  He had used his CPAP until he felt that weight loss had made it superfluous, and he has not used it in the last 12 months.   A recent [facility] directed HST confirmed the presence of NON-REM sleep dominant sleep apnea at a severe degree. This is highly suspicious for central sleep apnea, given that REM sleep has a lower apnea hypopnea index and is usually spared from central apnea.     The patient returns now for an in- lab titration. This step will allow to titrate to either CPAP or BiPAP or even ASV as necessary.  It does not seem to be necessary to supplement oxygen.  The patient endorsed the Epworth Sleepiness Scale at 11/24 points.   The patient's weight 218 pounds with a height of 70 (inches), resulting in a BMI of 31.2 kg/m2. The patient's neck circumference measured 18 inches.  CURRENT MEDICATIONS: Tylenol, Vitamin D3, Celexa, Trulicity, Fenofibrate, Allegra, Flonase, Glucotrol, Keppra, Glucophage, Bactroban, Prilosec, Miralax, Pyridoxine, Crestor, Diovan    PROCEDURE:  This is a multichannel digital polysomnogram utilizing the SomnoStar 11.2 system.  Electrodes and sensors were applied and monitored per AASM Specifications.   EEG, EOG, Chin and Limb EMG, were sampled at 200 Hz.  ECG, Snore and Nasal Pressure, Thermal Airflow, Respiratory Effort, CPAP Flow and Pressure, Oximetry was sampled at 50 Hz. Digital video and audio were recorded.       CPAP was initiated under AASM standards, at 5 cmH20 with an ESON nasal mask  in large size, and pressure was advanced to 8 cmH20 because of hypopneas, apneas and desaturations.  At a PAP pressure of 8 cmH20, there was a reduction of the AHI to 0.5/h with improvement of sleep apnea.  Lights Out was at 21:44 and Lights On at 04:33. Total recording time (TRT) was 409.5 minutes, with a total sleep time (TST) of 343 minutes.  The patient's sleep latency was 12 minutes. REM latency was 60.5 minutes.  The sleep efficiency was 83.8 %.    SLEEP ARCHITECTURE: WASO (Wake after sleep onset) was 52.5 minutes.  There were 29.5 minutes in Stage N1, 234 minutes Stage N2, 0 minutes Stage N3 and 79.5 minutes in Stage REM.   The percentage of Stage N1 was 8.6%, Stage N2 was 68.2%, Stage N3 was 0% and Stage R (REM sleep) was 23.2%. The sleep architecture was notable for REM sleep rebounding. RESPIRATORY ANALYSIS:  There was a total of 8 respiratory events: 0 apneas and 8 hypopneas .  The total APNEA/HYPOPNEA INDEX (AHI) was 1.4 /.  0 events occurred in REM sleep and 8 events in NREM. The REM AHI was 0 /hour versus a non-REM AHI of 1.8 /hour.  The patient spent 343 minutes of total sleep time in the supine position and 0 minutes in non-supine.   OXYGEN SATURATION & C02:  The baseline 02 saturation was 94%, with the lowest being 87%. Time spent below 89% saturation equaled 1 minute.  PERIODIC LIMB  MOVEMENTS:  The patient had a total of 0 Periodic Limb Movements.  The arousals were noted as: 27 were spontaneous, 0 were associated with PLMs, 7 were associated with respiratory events.  Audio and video analysis did not show any abnormal or unusual movements, behaviors, phonations or vocalizations.  Snoring was controlled. EKG was in keeping with normal sinus rhythm.   DIAGNOSIS Complex Sleep Apnea responded well to 8 cm water pressure on CPAP, no EPR was used and no oxygen was needed.    PLANS/RECOMMENDATIONS: Auto CPAP capable device with a setting from 6 through 13 cm water, no EPR, heated  humidification and an ESON II mask in large size.  CPAP therapy compliance is defined as 4 hours or more of nightly use.  Any apnea patient should avoid sedatives, hypnotics, and alcohol consumption. Apnea and snoring will increase under these substances.    DISCUSSION: A follow up appointment will be scheduled in the Sleep Clinic with Frann Rider  at [facility].   Please call [phone] with any questions.      I certify that I have reviewed the entire raw data recording prior to the issuance of this report in accordance with the Standards of Accreditation of the American Academy of Sleep Medicine (AASM)    Larey Seat, JefferyD. Medical Director, [facility] [employer] [employer] [facility] Neu[facility] and Sleep Medicine (Neurology and Sleep Medicine)

## 2021-06-10 NOTE — Addendum Note (Signed)
Addended by: Larey Seat on: 06/10/2021 03:30 PM   Modules accepted: Orders

## 2021-06-13 ENCOUNTER — Ambulatory Visit: Payer: Medicare Other | Admitting: Adult Health

## 2021-06-13 ENCOUNTER — Telehealth: Payer: Self-pay | Admitting: *Deleted

## 2021-06-13 NOTE — Telephone Encounter (Signed)
-----   Message from Larey Seat, MD sent at 06/10/2021  3:29 PM EDT ----- DIAGNOSIS 1. Complex Sleep Apnea responded well to 8 cm water pressure on CPAP, no EPR was used and no oxygen was needed.    PLANS/RECOMMENDATIONS: 1. Auto CPAP capable device with a setting from 6 through 13 cm water, no EPR, heated humidification and an ESON II mask in large size.  CPAP therapy compliance is defined as 4 hours or more of nightly use.  2. Any apnea patient should avoid sedatives, hypnotics, and alcohol consumption. Apnea and snoring will increase under these substances.    DISCUSSION: A follow up appointment will be scheduled in the Sleep Clinic with Frann Rider  at [facility].   Please call [phone] with any questions.

## 2021-06-13 NOTE — Telephone Encounter (Signed)
Called and spoke w/ wife. States husband not home currently, will be back this afternoon. She will have him call our office back.

## 2021-06-14 ENCOUNTER — Ambulatory Visit: Payer: Medicare Other | Admitting: Nurse Practitioner

## 2021-06-14 ENCOUNTER — Encounter: Payer: Self-pay | Admitting: *Deleted

## 2021-06-14 DIAGNOSIS — N1832 Chronic kidney disease, stage 3b: Secondary | ICD-10-CM

## 2021-06-14 DIAGNOSIS — K219 Gastro-esophageal reflux disease without esophagitis: Secondary | ICD-10-CM

## 2021-06-14 DIAGNOSIS — Z0000 Encounter for general adult medical examination without abnormal findings: Secondary | ICD-10-CM

## 2021-06-14 DIAGNOSIS — E1169 Type 2 diabetes mellitus with other specified complication: Secondary | ICD-10-CM

## 2021-06-14 DIAGNOSIS — N138 Other obstructive and reflux uropathy: Secondary | ICD-10-CM

## 2021-06-14 DIAGNOSIS — M1A00X0 Idiopathic chronic gout, unspecified site, without tophus (tophi): Secondary | ICD-10-CM

## 2021-06-14 DIAGNOSIS — I10 Essential (primary) hypertension: Secondary | ICD-10-CM

## 2021-06-14 DIAGNOSIS — F419 Anxiety disorder, unspecified: Secondary | ICD-10-CM

## 2021-06-14 DIAGNOSIS — Z79899 Other long term (current) drug therapy: Secondary | ICD-10-CM

## 2021-06-14 DIAGNOSIS — E669 Obesity, unspecified: Secondary | ICD-10-CM

## 2021-06-14 DIAGNOSIS — R7989 Other specified abnormal findings of blood chemistry: Secondary | ICD-10-CM

## 2021-06-14 DIAGNOSIS — E559 Vitamin D deficiency, unspecified: Secondary | ICD-10-CM

## 2021-06-14 NOTE — Telephone Encounter (Signed)
I called pt. I advised pt that Dr. Brett Fairy reviewed their sleep study results and found that pt has sleep apnea. Dr. Brett Fairy recommends that pt start cpap. I reviewed PAP compliance expectations with the pt. Pt is agreeable to starting a CPAP. I advised pt that an order will be sent to a DME, [employer], and [employer] will call the pt within about one week after they file with the pt's insurance. [employer] will show the pt how to use the machine, fit for masks, and troubleshoot the CPAP if needed. A follow up appt was made for insurance purposes with Maricela Bo, NP on 09/19/21 at 8:15am. Pt verbalized understanding to arrive 15 minutes early and bring their CPAP. A letter with all of this information in it will be mailed to the pt as a reminder. I verified with the pt that the address we have on file is correct. Pt verbalized understanding of results. Pt had no questions at this time but was encouraged to call back if questions arise. I have sent the order to [employer] and have received confirmation that they have received the order.

## 2021-06-14 NOTE — Progress Notes (Signed)
MEDICARE ANNUAL WELLNESS VISIT AND FOLLOW UP Assessment:   Medicare annual wellness visit Due Annually Health Maintenance Reviewed Healthy lifestyle reviewed and goals set   Essential hypertension -typically fairly controlled recently, mild elevation today, recheck on Monday by [place] with labs -cont meds -dash diet -monitor at home  OSA (obstructive sleep apnea) -sleep hygiene -lose weight -Continue to follow with Dr. Brett Fairy, getting new CPAP  Type II diabetes mellitus with stage 3b CKD Blood sugars improved , fasting 80-100 On metformin 500 mg QD; glipizide 5 mg BID, Trulicity 1.5 mg SQ QW He plans to work aggressively on diet -cont diet and exercise - Hemoglobin A1c  BPH/Prostatism -cont meds -currently without change from prior visit -followed by urology   Hyperlipidemia -cont meds -diet and exercise - Lipid panel - CMP   Idiopathic gout, unspecified chronicity, unspecified site Gout- recheck Uric acid as needed, doing well off of allopurinol Diet discussed   Vitamin D deficiency -cont supplement, suggested he continue current dose but to take at least 4 hours apart from PPI, defer recheck to next visit - goal 60-100 Defer Vitamin  D   Medication management - CBC with Differential/Platelet - CMP/GFR  Seizures Controlled on Keppra 500mg  BID No recent seizure activity, continue to monitor Continue to follow with Dr Leonie Man  Obesity - BMI 30-34 Long discussion about weight loss, diet, and exercise Recommended diet heavy in fruits and veggies and low in animal meats, cheeses, and dairy products, appropriate calorie intake Discussed appropriate weight for height  Follow up at next visit   Abnormal Thyroid Blood Test TSH  Anxiety Currently on Celexa 20 mg daily Stress management techniques discussed, increase water, good sleep hygiene discussed, increase exercise, and increase veggies.   Cerebral Meningioma Continue to follow with Dr Ellene Route and  MRI    Over 30 minutes of exam, counseling, chart review, and critical decision making was performed Future Appointments  Date Time Provider Sims  07/06/2021  8:45 AM Frann Rider, NP [facility] None  08/25/2021 10:20 AM Philemon Kingdom, MD [facility] None  09/13/2021  9:30 AM Unk Pinto, MD [facility] None  09/19/2021  8:15 AM Frann Rider, NP [facility] None  10/04/2021 11:00 AM Newton Pigg, RPH [facility] None  06/15/2022 11:00 AM Magda Bernheim, NP [facility] None    Plan:   During the course of the visit the patient was educated and counseled about appropriate screening and preventive services including:   Pneumococcal vaccine  Influenza vaccine Prevnar 13 Td vaccine Screening electrocardiogram Colorectal cancer screening Diabetes screening Glaucoma screening Nutrition counseling    Subjective:  Jeffery Reed is a 75 y.o. male who presents for Medicare Annual Wellness Visit and 3 month follow up for HTN, hyperlipidemia, T2 diabetes, and vitamin D Def.   he has a diagnosis of anxiety and is currently taking Celexa 20 mg QD. Symptoms are well controlled with medication   He has BPH/prostatism followed by urology.   BMI is Body mass index is 32.71 kg/m., he admits exercise has been limited, not eating as well since mother passed.  Wt Readings from Last 3 Encounters:  06/15/21 228 lb (103.4 kg)  04/21/21 222 lb 9.6 oz (101 kg)  03/08/21 218 lb 8 oz (99.1 kg)   His blood pressure has been controlled at home, today their BP is BP: 132/80 BP Readings from Last 3 Encounters:  06/15/21 132/80  04/21/21 128/84  03/08/21 129/78    He does not workout. He denies chest pain, shortness of breath,  dizziness.   He is on cholesterol medication (rosuvastatin 10 mg daily) and denies myalgias. His cholesterol is not at goal. The cholesterol last visit was:   Lab Results  Component Value Date   CHOL 165 03/03/2021   HDL 30 (L) 03/03/2021   LDLCALC 98  03/03/2021   TRIG 243 (H) 03/03/2021   CHOLHDL 5.5 (H) 03/03/2021   He has been working on diet and exercise for diabetes with CKD, and denies foot ulcerations, hyperglycemia, hypoglycemia , increased appetite, nausea, paresthesia of the feet, polydipsia, polyuria, visual disturbances, vomiting and weight loss. He checks random fasting glucoses; has been running 200-250 since his mother passed for the past month, currently on metformin 500 mg with dinner and glipizide 5 mg 2 tabs twice daily. Last A1C in the office was:   Lab Results  Component Value Date   HGBA1C 6.0 (H) 03/03/2021   Last GFR Lab Results  Component Value Date   GFRNONAA 37 (L) 10/25/2020    Patient is on Vitamin D supplement, taking 10000 IU, taking in AM with PPI Lab Results  Component Value Date   VD25OH 72 03/03/2021       Medication Review: Current Outpatient Medications on File Prior to Visit  Medication Sig Dispense Refill   acetaminophen (TYLENOL) 650 MG CR tablet Take 650 mg by mouth every 8 (eight) hours as needed for pain (or headaches).     Cholecalciferol (VITAMIN D3) 10000 units capsule Take 10,000 Units by mouth at bedtime. Taking 5,000 IU QOD, alternating with 10,000     citalopram (CELEXA) 20 MG tablet TAKE 1 TABLET BY MOUTH DAILY FOR MOOD 90 tablet 3   Dulaglutide (TRULICITY) 1.5 WU/9.8JX SOPN Inject 1.5 mg into the skin once a week. 6 mL 3   fenofibrate 160 MG tablet TAKE 1 TABLET BY MOUTH  DAILY FOR TRIGLYCERIDES  (BLOOD FATS) 90 tablet 3   fexofenadine (ALLEGRA) 180 MG tablet Take 180 mg by mouth daily as needed for allergies or rhinitis.     fluticasone (FLONASE) 50 MCG/ACT nasal spray Place 2 sprays into both nostrils daily as needed for allergies or rhinitis.     glipiZIDE (GLUCOTROL) 5 MG tablet Take 1-2 tablets (5-10 mg total) by mouth 2 (two) times daily before a meal. 360 tablet 3   glucose blood (FREESTYLE LITE) test strip Check blood sugar 1 time daily-DX-E11.22 100 each 5    levETIRAcetam (KEPPRA) 500 MG tablet Take 1 tablet (500 mg total) by mouth 2 (two) times daily. 180 tablet 3   metFORMIN (GLUCOPHAGE) 500 MG tablet Take 1 tablet (500 mg total) by mouth daily with supper. 90 tablet 3   mupirocin ointment (BACTROBAN) 2 % APPLY TWICE A DAY TO SKIN INFECTION. 66 g 1   omeprazole (PRILOSEC) 40 MG capsule TAKE 1 CAPSULE BY MOUTH  DAILY TO PREVENT  INDIGESTION & HEARTBURN 90 capsule 3   pyridOXINE (B-6) 50 MG tablet Take 1 tablet (50 mg total) by mouth daily. 30 tablet 0   rosuvastatin (CRESTOR) 10 MG tablet Take  1 tablet  Daily  for Cholesterol 90 tablet 3   valsartan (DIOVAN) 160 MG tablet Take  1 tablet  Daily  for BP & Diabetic Kidney Protection  / Patient knows to take by mouth  ! 90 tablet 1   No current facility-administered medications on file prior to visit.    Current Problems (verified) Patient Active Problem List   Diagnosis Date Noted   At risk for central sleep apnea 06/10/2021  Loud snoring 03/08/2021   History of sleep apnea 03/08/2021   Hx of resection of meningioma 03/08/2021   Retrognathia 03/08/2021   Seizure-like activity ([place]) 10/15/2020   Acute-on-chronic kidney injury ([place]) 10/15/2020   Seizures ([place]) 10/14/2020   Renal mass 01/08/2020   Noncompliance with CPAP treatment    Stage 3 chronic kidney disease ([facility])    Steroid-induced hyperglycemia    Enlarged prostate    CKD stage 3 due to type 2 diabetes mellitus ([place]) 03/12/2019   Anxiety 02/18/2018   GERD (gastroesophageal reflux disease) 03/12/2016   Obesity (BMI 30.0-34.9) 06/01/2015   Medication management 08/19/2014   BPH/Prostatism 08/19/2014   Essential hypertension 08/17/2013   Type 2 diabetes mellitus with stage 3 chronic kidney disease, without long-term current use of insulin ([place]) 08/17/2013   Vitamin D deficiency 08/17/2013   Hyperlipidemia associated with type 2 diabetes mellitus ([place])    Testosterone deficiency    Gout    OSA (obstructive sleep apnea)      Screening Tests Immunization History  Administered Date(s) Administered   DT (Pediatric) 06/01/2015   Fluad Quad(high Dose 65+) 05/20/2020   Influenza Whole 06/05/2013   Influenza, High Dose Seasonal PF 06/05/2014, 06/01/2015, 06/14/2016, 06/22/2017, 06/11/2018, 05/13/2019, 05/17/2021   PFIZER(Purple Top)SARS-COV-2 Vaccination 09/27/2019, 10/20/2019   Pneumococcal Conjugate-13 08/19/2014   Pneumococcal Polysaccharide-23 08/22/2003, 12/10/2015   Tdap 03/08/2004   Zoster, Live 08/18/2013   Preventative care: Last colonoscopy: 01/2019, 3 polyps removed, due 01/2022 Ct head 2010 CXR 2010  Prior vaccinations: TD or Tdap: 2016  Influenza: 2022  Pneumococcal: 2017 Prevnar13: 2015 Shingles/Zostavax: 2014  Names of Other Physician/Practitioners you currently use: 1. [facility] here for primary care 2. [facility], eye doctor, last visit 09/10/2020 Patient Care Team: Unk Pinto, MD as PCP - General (Internal Medicine) Franchot Gallo, MD as Consulting Physician (Urology) Jamal Maes, MD as Consulting Physician (Nephrology) Rush Landmark, [facility] as Pharmacist (Pharmacist) Frann Rider, NP as Nurse Practitioner (Neurology) Garvin Fila, MD as Referring Physician (Neurology)  Allergies Allergies  Allergen Reactions   Cardura [Doxazosin Mesylate] Other (See Comments)    Nasal congestion   Codeine Other (See Comments)    The patient passed out   Hytrin [Terazosin] Other (See Comments)    Reaction not recalled    SURGICAL HISTORY He  has a past surgical history that includes Foot surgery (10/2009); Tonsillectomy (1953); Myringotomy (Left, 02-2005); Colonoscopy; and Craniotomy (Right, 12/29/2019). FAMILY HISTORY His family history includes Diabetes in his maternal grandmother and paternal grandmother; GER disease in his mother; Hyperlipidemia in his father; Hypertension in his brother, father, and mother; Parkinson's disease  in his father; Stroke in his paternal grandfather and paternal grandmother. SOCIAL HISTORY He  reports that he has never smoked. He has never used smokeless tobacco. He reports that he does not drink alcohol and does not use drugs.  MEDICARE WELLNESS OBJECTIVES: Physical activity: Current Exercise Habits: The patient does not participate in regular exercise at present, Exercise limited by: None identified Cardiac risk factors: Cardiac Risk Factors include: advanced age (>33men, >69 women);diabetes mellitus;dyslipidemia;hypertension;obesity (BMI >30kg/m2);sedentary lifestyle Depression/mood screen:   Depression screen [facility] 2/9 06/15/2021  Decreased Interest 0  Down, Depressed, Hopeless 0  PHQ - 2 Score 0    ADLs:  In your present state of health, do you have any difficulty performing the following activities: 06/15/2021 03/06/2021  Hearing? N N  Vision? N N  Difficulty concentrating or making decisions? N N  Walking or climbing stairs? N N  Dressing or bathing? N N  Doing errands, shopping? N N  Some recent data might be hidden     Cognitive Testing  Alert? Yes  Normal Appearance?Yes  Oriented to person? Yes  Place? Yes   Time? Yes  Recall of three objects?  Yes  Can perform simple calculations? Yes  Displays appropriate judgment?Yes  Can read the correct time from a watch face?Yes  EOL planning: Does Patient Have a Medical Advance Directive?: Yes Type of Advance Directive: Living will, Healthcare Power of Attorney Does patient want to make changes to medical advance directive?: No - Patient declined Copy of [place] in Chart?: No - copy requested Review of Systems  Constitutional:  Negative for chills, fever and weight loss.  HENT:  Negative for congestion and hearing loss.   Eyes:  Negative for blurred vision and double vision.  Respiratory:  Negative for cough and shortness of breath.   Cardiovascular:  Negative for chest pain, palpitations, orthopnea and  leg swelling.  Gastrointestinal:  Negative for abdominal pain, constipation, diarrhea, heartburn, nausea and vomiting.  Genitourinary:  Negative for dysuria.  Musculoskeletal:  Negative for falls, joint pain and myalgias.  Skin:  Negative for rash.  Neurological:  Negative for dizziness, tingling, tremors, loss of consciousness and headaches.  Endo/Heme/Allergies:  Does not bruise/bleed easily.  Psychiatric/Behavioral:  Negative for depression, memory loss and suicidal ideas.     Objective:   Today's Vitals   06/15/21 1153  BP: 132/80  Pulse: 71  Resp: 16  Temp: 97.9 F (36.6 C)  SpO2: 98%  Weight: 228 lb (103.4 kg)  Height: 5\' 10"  (1.778 m)    Body mass index is 32.71 kg/m.  Physical Exam Constitutional:      Appearance: Normal appearance. He is obese.  HENT:     Head: Normocephalic.     Right Ear: Tympanic membrane normal.     Left Ear: Tympanic membrane normal.     Mouth/Throat:     Mouth: Mucous membranes are dry.     Pharynx: No oropharyngeal exudate.  Eyes:     Extraocular Movements: Extraocular movements intact.     Pupils: Pupils are equal, round, and reactive to light.  Cardiovascular:     Rate and Rhythm: Normal rate and regular rhythm.     Pulses: Normal pulses.     Heart sounds: Normal heart sounds.  Pulmonary:     Effort: Pulmonary effort is normal.     Breath sounds: Normal breath sounds.  Abdominal:     General: Bowel sounds are normal.     Palpations: Abdomen is soft.  Musculoskeletal:        General: Normal range of motion.     Cervical back: Normal range of motion and neck supple.  Skin:    General: Skin is warm and dry.  Neurological:     General: No focal deficit present.     Mental Status: He is alert and oriented to person, place, and time.  Psychiatric:        Mood and Affect: Mood normal.        Behavior: Behavior normal.        Thought Content: Thought content normal.        Judgment: Judgment normal.     Medicare Attestation I  have personally reviewed: The patient's medical and social history Their use of alcohol, tobacco or illicit drugs Their current medications and supplements The patient's functional ability including ADLs,fall risks, home safety risks, cognitive, and  hearing and visual impairment Diet and physical activities Evidence for depression or mood disorders  The patient's weight, height, BMI, and visual acuity have been recorded in the chart.  I have made referrals, counseling, and provided education to the patient based on review of the above and I have provided the patient with a written personalized care plan for preventive services.     Magda Bernheim, NP   06/15/2021

## 2021-06-15 ENCOUNTER — Encounter: Payer: Self-pay | Admitting: Nurse Practitioner

## 2021-06-15 ENCOUNTER — Other Ambulatory Visit: Payer: Self-pay

## 2021-06-15 ENCOUNTER — Ambulatory Visit (INDEPENDENT_AMBULATORY_CARE_PROVIDER_SITE_OTHER): Payer: Medicare Other | Admitting: Nurse Practitioner

## 2021-06-15 VITALS — BP 132/80 | HR 71 | Temp 97.9°F | Resp 16 | Ht 70.0 in | Wt 228.0 lb

## 2021-06-15 DIAGNOSIS — Z0001 Encounter for general adult medical examination with abnormal findings: Secondary | ICD-10-CM | POA: Diagnosis not present

## 2021-06-15 DIAGNOSIS — R6889 Other general symptoms and signs: Secondary | ICD-10-CM

## 2021-06-15 DIAGNOSIS — E1169 Type 2 diabetes mellitus with other specified complication: Secondary | ICD-10-CM | POA: Diagnosis not present

## 2021-06-15 DIAGNOSIS — E559 Vitamin D deficiency, unspecified: Secondary | ICD-10-CM

## 2021-06-15 DIAGNOSIS — G4733 Obstructive sleep apnea (adult) (pediatric): Secondary | ICD-10-CM

## 2021-06-15 DIAGNOSIS — N1832 Chronic kidney disease, stage 3b: Secondary | ICD-10-CM | POA: Diagnosis not present

## 2021-06-15 DIAGNOSIS — F419 Anxiety disorder, unspecified: Secondary | ICD-10-CM | POA: Diagnosis not present

## 2021-06-15 DIAGNOSIS — I10 Essential (primary) hypertension: Secondary | ICD-10-CM

## 2021-06-15 DIAGNOSIS — E785 Hyperlipidemia, unspecified: Secondary | ICD-10-CM

## 2021-06-15 DIAGNOSIS — Z9989 Dependence on other enabling machines and devices: Secondary | ICD-10-CM

## 2021-06-15 DIAGNOSIS — E1122 Type 2 diabetes mellitus with diabetic chronic kidney disease: Secondary | ICD-10-CM

## 2021-06-15 DIAGNOSIS — D320 Benign neoplasm of cerebral meninges: Secondary | ICD-10-CM

## 2021-06-15 DIAGNOSIS — R569 Unspecified convulsions: Secondary | ICD-10-CM | POA: Diagnosis not present

## 2021-06-15 DIAGNOSIS — N401 Enlarged prostate with lower urinary tract symptoms: Secondary | ICD-10-CM | POA: Diagnosis not present

## 2021-06-15 DIAGNOSIS — R7989 Other specified abnormal findings of blood chemistry: Secondary | ICD-10-CM

## 2021-06-15 DIAGNOSIS — M1A00X0 Idiopathic chronic gout, unspecified site, without tophus (tophi): Secondary | ICD-10-CM

## 2021-06-15 DIAGNOSIS — Z79899 Other long term (current) drug therapy: Secondary | ICD-10-CM | POA: Diagnosis not present

## 2021-06-15 DIAGNOSIS — N138 Other obstructive and reflux uropathy: Secondary | ICD-10-CM

## 2021-06-15 NOTE — Patient Instructions (Signed)

## 2021-06-16 LAB — HEMOGLOBIN A1C
Hgb A1c MFr Bld: 6.5 % of total Hgb — ABNORMAL HIGH (ref <5.7)
Mean Plasma Glucose: 140 mg/dL
eAG (mmol/L): 7.7 mmol/L

## 2021-06-16 LAB — LIPID PANEL
Cholesterol: 156 mg/dL (ref <200)
HDL: 31 mg/dL — ABNORMAL LOW (ref >=40)
LDL Cholesterol (Calc): 96 mg/dL (calc)
Non-HDL Cholesterol (Calc): 125 mg/dL (calc) (ref <130)
Total CHOL/HDL Ratio: 5 (calc) — ABNORMAL HIGH (ref <5.0)
Triglycerides: 202 mg/dL — ABNORMAL HIGH (ref <150)

## 2021-06-16 LAB — CBC WITH DIFFERENTIAL/PLATELET
Absolute Monocytes: 685 cells/uL (ref 200–950)
Basophils Absolute: 31 cells/uL (ref 0–200)
Basophils Relative: 0.4 %
Eosinophils Absolute: 270 cells/uL (ref 15–500)
Eosinophils Relative: 3.5 %
HCT: 42 % (ref 38.5–50.0)
Hemoglobin: 13.8 g/dL (ref 13.2–17.1)
Lymphs Abs: 1994 cells/uL (ref 850–3900)
MCH: 28.8 pg (ref 27.0–33.0)
MCHC: 32.9 g/dL (ref 32.0–36.0)
MCV: 87.7 fL (ref 80.0–100.0)
MPV: 9.9 fL (ref 7.5–12.5)
Monocytes Relative: 8.9 %
Neutro Abs: 4720 cells/uL (ref 1500–7800)
Neutrophils Relative %: 61.3 %
Platelets: 171 10*3/uL (ref 140–400)
RBC: 4.79 10*6/uL (ref 4.20–5.80)
RDW: 13.3 % (ref 11.0–15.0)
Total Lymphocyte: 25.9 %
WBC: 7.7 10*3/uL (ref 3.8–10.8)

## 2021-06-16 LAB — COMPLETE METABOLIC PANEL WITH GFR
AG Ratio: 1.8 (calc) (ref 1.0–2.5)
ALT: 14 U/L (ref 9–46)
AST: 23 U/L (ref 10–35)
Albumin: 4.4 g/dL (ref 3.6–5.1)
Alkaline phosphatase (APISO): 51 U/L (ref 35–144)
BUN/Creatinine Ratio: 13 (calc) (ref 6–22)
BUN: 24 mg/dL (ref 7–25)
CO2: 23 mmol/L (ref 20–32)
Calcium: 9.7 mg/dL (ref 8.6–10.3)
Chloride: 106 mmol/L (ref 98–110)
Creat: 1.85 mg/dL — ABNORMAL HIGH (ref 0.70–1.28)
Globulin: 2.5 g/dL (calc) (ref 1.9–3.7)
Glucose, Bld: 137 mg/dL — ABNORMAL HIGH (ref 65–99)
Potassium: 4.2 mmol/L (ref 3.5–5.3)
Sodium: 141 mmol/L (ref 135–146)
Total Bilirubin: 0.5 mg/dL (ref 0.2–1.2)
Total Protein: 6.9 g/dL (ref 6.1–8.1)
eGFR: 38 mL/min/{1.73_m2} — ABNORMAL LOW (ref >=60)

## 2021-06-24 ENCOUNTER — Other Ambulatory Visit: Payer: Self-pay | Admitting: Internal Medicine

## 2021-06-24 DIAGNOSIS — E1122 Type 2 diabetes mellitus with diabetic chronic kidney disease: Secondary | ICD-10-CM

## 2021-06-24 DIAGNOSIS — N1832 Chronic kidney disease, stage 3b: Secondary | ICD-10-CM

## 2021-06-24 MED ORDER — TRULICITY 3 MG/0.5ML ~~LOC~~ SOAJ: 3.0000 mg | SUBCUTANEOUS | 0 refills | Status: DC

## 2021-06-30 ENCOUNTER — Other Ambulatory Visit: Payer: Self-pay | Admitting: Adult Health Nurse Practitioner

## 2021-06-30 DIAGNOSIS — E1122 Type 2 diabetes mellitus with diabetic chronic kidney disease: Secondary | ICD-10-CM

## 2021-06-30 DIAGNOSIS — N183 Chronic kidney disease, stage 3 unspecified: Secondary | ICD-10-CM

## 2021-07-06 ENCOUNTER — Other Ambulatory Visit: Payer: Self-pay

## 2021-07-06 ENCOUNTER — Encounter: Payer: Self-pay | Admitting: Adult Health

## 2021-07-06 ENCOUNTER — Ambulatory Visit (INDEPENDENT_AMBULATORY_CARE_PROVIDER_SITE_OTHER): Payer: Medicare Other | Admitting: Adult Health

## 2021-07-06 VITALS — BP 132/78 | HR 63 | Ht 70.0 in | Wt 226.0 lb

## 2021-07-06 DIAGNOSIS — G40909 Epilepsy, unspecified, not intractable, without status epilepticus: Secondary | ICD-10-CM | POA: Diagnosis not present

## 2021-07-06 DIAGNOSIS — Z9889 Other specified postprocedural states: Secondary | ICD-10-CM

## 2021-07-06 DIAGNOSIS — Z8603 Personal history of neoplasm of uncertain behavior: Secondary | ICD-10-CM | POA: Diagnosis not present

## 2021-07-06 NOTE — Progress Notes (Signed)
[facility] [address] [place]. [place] [address] [phone]       OFFICE FOLLOW UP NOTE  Jeffery Reed Date of Birth:  1946/03/23 Medical Record Number:  [id number]   Referring MD: Jeffery Reed  Reason for Referral: Seizure  Chief Complaint  Patient presents with   Follow-up    RM  3 alone Pt is well and stable, no seizures since last visit        HPI:   Update 07/06/2021 JM: Returns for 69-month seizure follow-up unaccompanied.  Overall stable since prior visit without any reoccurring seizure activity.  Remains on Keppra 500 mg twice daily tolerating without side effects.  Continues to live with his wife -able to maintain ADLs and IADLs independently.  He drives his personal car without difficulty.  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  No further concerns at this time.     History provided for reference purposes only Update 12/29/2020 JM: Jeffery Reed returns for seizure follow-up after initial consult visit with Jeffery Reed 2 months ago.  He is unaccompanied at today's visit.  EEG 12/13/2020 did show mild irritability which increases predisposition of seizures and recommended continuation of Keppra.  He has remained on Keppra tolerating without side effects.  Denies any additional seizure activity.  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  He does report prior history of sleep apnea over 10 years ago previously using CPAP but he stopped using after weight loss.  He denies any witnessed apnea but does report snoring, occasional fatigue and nocturia.  He questions possible need of repeat sleep study.   Consult visit 10/25/2020 Jeffery Reed: Jeffery Reed is a pleasant 74 year old Caucasian male seen today for initial office consultation visit for seizure.  History is obtained from the patient and review of electronic medical records  and I personally reviewed pertinent imaging films in PACS.  He has past medical history of hypertension, hyperlipidemia, prediabetes, obstructive sleep apnea on CPAP, gout, anxiety who was recently diagnosed with large 7 x 2.9 x 3.1 cm right frontal convexity meningioma in May 2021.  At that time he presented with left leg pain and had a fall.  Routine brain imaging showed a large right frontal meningioma.  He underwent gross total excision of the meningioma on 12/29/2019 by Dr. Ellene Reed and did well postoperatively.  He was on Decadron which was tapered he was also given Keppra 500 mg twice daily which he took for 6 months and seizure prophylaxis and overall tolerated very well though his wife had noticed mild personality change.  He did discontinue the Keppra.  He presented on 10/15/2020 with episode of involuntary movements on his jaw and difficulty controlling it after eating food.  This lasted only a couple of minutes.  Wife noticed that he was rhythmically chewing and could not stop it and was choking.  He had trouble speaking and try to speak but words did not come out.  He did not have any alteration of consciousness or any tongue bite.  There was no extremity tonic-clonic movements.  Patient had no obvious trigger for this episode.  He does have some habitual nocturia and wakes up every 2 3 hours but the patient states he does sleep 6 to 8 hours every night.  He has no prior history of head injury, seizures, stroke, TIA, headaches or any other neurological problems.  There is no family history of epilepsy.  Patient has since been restarted on Keppra 500 mg twice daily which is tolerating well without any side effects.  He had an EEG done on 10/15/2020 which was also normal.  MRI scan of the brain done on 10/15/2020 showed no residual meningioma tumor with postoperative changes of craniotomy in the right frontal region.  No acute abnormality.  Overall no significant change compared to previous MRI from September  2021.  Patient denies drinking alcohol ,smoking marijuana or using any drugs of abuse.  ROS:   14 system review of systems is positive for those listed in HPI and all other systems negative  PMH:  Past Medical History:  Diagnosis Date   Allergy    Anxiety    Cataract    beginning stage 01/22/2019   Chronic kidney disease    enlarge bladder   Diabetes mellitus without complication (HCC)    GERD (gastroesophageal reflux disease)    Gout    Hyperlipidemia    Hypertension    OSA (obstructive sleep apnea)    Pre-diabetes    Seizures (HCC)    Sleep apnea    c pap    Social History:  Social History   Socioeconomic History   Marital status: Married    Spouse name: Not on file   Number of children: Not on file   Years of education: Not on file   Highest education level: Not on file  Occupational History   Occupation: Recruitment consultant    Comment: on STD 10/25/20  Tobacco Use   Smoking status: Never   Smokeless tobacco: Never  Vaping Use   Vaping Use: Never used  Substance and Sexual Activity   Alcohol use: No   Drug use: No   Sexual activity: Not on file  Other Topics Concern   Not on file  Social History Narrative   Lives with wife   Right handed   Drinks 3-4 cups caffeine daily   Social Determinants of Health   Financial Resource Strain: Not on file  Food Insecurity: Not on file  Transportation Needs: Not on file  Physical Activity: Not on file  Stress: Not on file  Social Connections: Not on file  Intimate Partner Violence: Not on file    Medications:   Current Outpatient Medications on File Prior to Visit  Medication Sig Dispense Refill   acetaminophen (TYLENOL) 650 MG CR tablet Take 650 mg by mouth every 8 (eight) hours as needed for pain (or headaches).     Cholecalciferol (VITAMIN D3) 10000 units capsule Take 10,000 Units by mouth at bedtime. Taking 5,000 IU QOD, alternating with 10,000     citalopram (CELEXA) 20 MG tablet TAKE 1 TABLET BY MOUTH DAILY FOR MOOD  90 tablet 3   Dulaglutide (TRULICITY) 3 TD/4.2AJ SOPN Inject 3 mg as directed once a week. 6 mL 0   fenofibrate 160 MG tablet TAKE 1 TABLET BY MOUTH  DAILY FOR TRIGLYCERIDES  (BLOOD FATS) 90 tablet 3   fexofenadine (ALLEGRA) 180 MG tablet Take 180 mg by mouth daily as needed for allergies or rhinitis.     fluticasone (FLONASE) 50 MCG/ACT nasal spray Place 2 sprays into both nostrils daily as needed for allergies or rhinitis.     glipiZIDE (GLUCOTROL) 5 MG tablet TAKE 1 TO 2 TABLETS BY  MOUTH 3 TIMES DAILY WITH  MEALS FOR DIABETES 540 tablet 3   glucose blood (FREESTYLE LITE) test strip Check blood sugar 1 time daily-DX-E11.22 100  each 5   levETIRAcetam (KEPPRA) 500 MG tablet Take 1 tablet (500 mg total) by mouth 2 (two) times daily. 180 tablet 3   metFORMIN (GLUCOPHAGE) 500 MG tablet Take 1 tablet (500 mg total) by mouth daily with supper. 90 tablet 3   mupirocin ointment (BACTROBAN) 2 % APPLY TWICE A DAY TO SKIN INFECTION. 66 g 1   omeprazole (PRILOSEC) 40 MG capsule TAKE 1 CAPSULE BY MOUTH  DAILY TO PREVENT  INDIGESTION & HEARTBURN 90 capsule 3   pyridOXINE (B-6) 50 MG tablet Take 1 tablet (50 mg total) by mouth daily. 30 tablet 0   rosuvastatin (CRESTOR) 10 MG tablet Take  1 tablet  Daily  for Cholesterol 90 tablet 3   valsartan (DIOVAN) 160 MG tablet Take  1 tablet  Daily  for BP & Diabetic Kidney Protection  / Patient knows to take by mouth  ! 90 tablet 1   No current facility-administered medications on file prior to visit.    Allergies:   Allergies  Allergen Reactions   Cardura [Doxazosin Mesylate] Other (See Comments)    Nasal congestion   Codeine Other (See Comments)    The patient passed out   Hytrin [Terazosin] Other (See Comments)    Reaction not recalled    Physical Exam Today's Vitals   07/06/21 0814  BP: 132/78  Pulse: 63  Weight: 226 lb (102.5 kg)  Height: 5\' 10"  (1.778 m)    Body mass index is 32.43 kg/m.   General: Mildly obese pleasant elderly Caucasian  male seated, in no evident distress Head: head normocephalic and atraumatic.   Neck: supple with no carotid or supraclavicular bruits Cardiovascular: regular rate and rhythm, no murmurs Musculoskeletal: no deformity.  Right frontal craniotomy scar from meningioma surgery Skin:  no rash/petichiae Vascular:  Normal pulses all extremities  Neurologic Exam Mental Status: Awake and fully alert. Oriented to place and time. Recent and remote memory intact. Attention span, concentration and fund of knowledge appropriate. Mood and affect appropriate.  Cranial Nerves: Pupils equal, briskly reactive to light. Extraocular movements full without nystagmus. Visual fields full to confrontation. Hearing intact.  Mild left lower facial asymmetry.  Sensation intact. Face, tongue, palate moves normally and symmetrically.  Motor: Normal bulk and tone. Normal strength in all tested extremity muscles. Sensory.: intact to touch , pinprick , position and vibratory sensation.  Coordination: Rapid alternating movements normal in all extremities. Finger-to-nose and heel-to-shin performed accurately bilaterally. Gait and Station: Arises from chair without difficulty. Stance is normal. Gait demonstrates normal stride length and balance without use of assistive device.  Able to heel, toe and tandem walk without difficulty.  Reflexes: 1+ and symmetric. Toes downgoing.       ASSESSMENT/PLAN: 75 year old Caucasian male with solitary episode of simple partial seizure in February 2022 likely symptomatic from his right frontal meningioma surgery from May 2021.  EEG 01/12/2021 abnormal with mild irritability of the brain on the right side which may increase predisposition for seizures however no definite seizure activity noted.  Noted remote hx of sleep apnea no longer on CPAP during visit 12/29/2020 - eval by Dr. Brett Fairy 03/08/2021 completing HST and CPAP titration and initiated CPAP     Simple partial seizure -Continue Keppra  500 mg twice daily for seizure prophylaxis - refill up to date -Recent CMP and CBC with diff satisfactory (completed 06/15/2021) - discussed seizure provoking stimuli like sleep deprivation, medication noncompliance, irregular eating and sleeping habits and extremes of exertion and to avoid stimulants like alcohol,  marijuana and substance abuse agents.  Per Jeffery Reed, patient reminded to limit his driving only to daytime hours and family routes and avoid night driving.  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 Brett Fairy -[place] 03/30/2021 - IMPRESSION:  This HST confirms the presence of known REM sleep dominant sleep apnea at a severe degree.  This is highly suspicious for central sleep apnea, given that REM sleep has a lower apnea hypopnea index and is usually spared from central apnea -CPAP titration 06/02/2021 -AutoPap initiated -Initial f/u visit scheduled 09/19/2021 - currently awaiting CPAP machine      Follow-up as scheduled in January for initial CPAP compliance visit - will then plan on combining both seizures and CPAP follow-up visits if able   CC:  Unk Pinto, MD    I spent 26 minutes of face-to-face and non-face-to-face time with patient.  This included previsit chart review, lab review, study review, electronic health record documentation, patient education and discussion regarding seizures likely from right frontal meningioma surgery and ongoing use of AED for seizure prevention, seizure triggers and importance of avoidance, remote history of sleep apnea with prior use of CPAP and completing repeat sleep study with plans on initiating CPAP and answered all other questions to patient's satisfaction   Frann Rider, AGNP-BC  [facility] [address] [place], [place] [address]  Phone [phone]  Fax [phone] Note: This document was prepared with digital dictation and possible smart phrase technology. Any transcriptional errors that result from this process are unintentional.

## 2021-07-06 NOTE — Patient Instructions (Addendum)
Your Plan:  Continue Keppra 500 mg twice daily  Please let me know if you experience any type of seizure activity    Follow-up as scheduled on 09/19/2021 for initial CPAP compliance visit   Happy Birthday!!      Thank you for coming to see [place] at [facility]. I hope we have been able to provide you high quality care today.  You may receive a patient satisfaction survey over the next few weeks. We would appreciate your feedback and comments so that we may continue to improve ourselves and the health of our patients.

## 2021-07-20 DIAGNOSIS — R350 Frequency of micturition: Secondary | ICD-10-CM | POA: Diagnosis not present

## 2021-07-20 DIAGNOSIS — N401 Enlarged prostate with lower urinary tract symptoms: Secondary | ICD-10-CM | POA: Diagnosis not present

## 2021-07-20 DIAGNOSIS — N3281 Overactive bladder: Secondary | ICD-10-CM | POA: Diagnosis not present

## 2021-07-27 ENCOUNTER — Encounter: Payer: Self-pay | Admitting: Internal Medicine

## 2021-07-28 ENCOUNTER — Encounter: Payer: Self-pay | Admitting: Internal Medicine

## 2021-08-08 ENCOUNTER — Encounter: Payer: Self-pay | Admitting: Emergency Medicine

## 2021-08-08 ENCOUNTER — Ambulatory Visit: Payer: Medicare Other

## 2021-08-08 ENCOUNTER — Ambulatory Visit
Admission: EM | Admit: 2021-08-08 | Discharge: 2021-08-08 | Disposition: A | Discharge status: Home / Self Care | Payer: Medicare Other | Attending: Family Medicine | Admitting: Family Medicine

## 2021-08-08 ENCOUNTER — Ambulatory Visit (INDEPENDENT_AMBULATORY_CARE_PROVIDER_SITE_OTHER): Payer: Medicare Other

## 2021-08-08 DIAGNOSIS — J22 Unspecified acute lower respiratory infection: Secondary | ICD-10-CM

## 2021-08-08 DIAGNOSIS — R059 Cough, unspecified: Secondary | ICD-10-CM

## 2021-08-08 DIAGNOSIS — K449 Diaphragmatic hernia without obstruction or gangrene: Secondary | ICD-10-CM | POA: Diagnosis not present

## 2021-08-08 DIAGNOSIS — R911 Solitary pulmonary nodule: Secondary | ICD-10-CM | POA: Diagnosis not present

## 2021-08-08 IMAGING — DX DG CHEST 2V
2 series · 2 of 2 positions shown · non-contrast
Comparison: [DATE]

CLINICAL DATA: Cough.

EXAM:
CHEST - 2 VIEW

[chest pa]
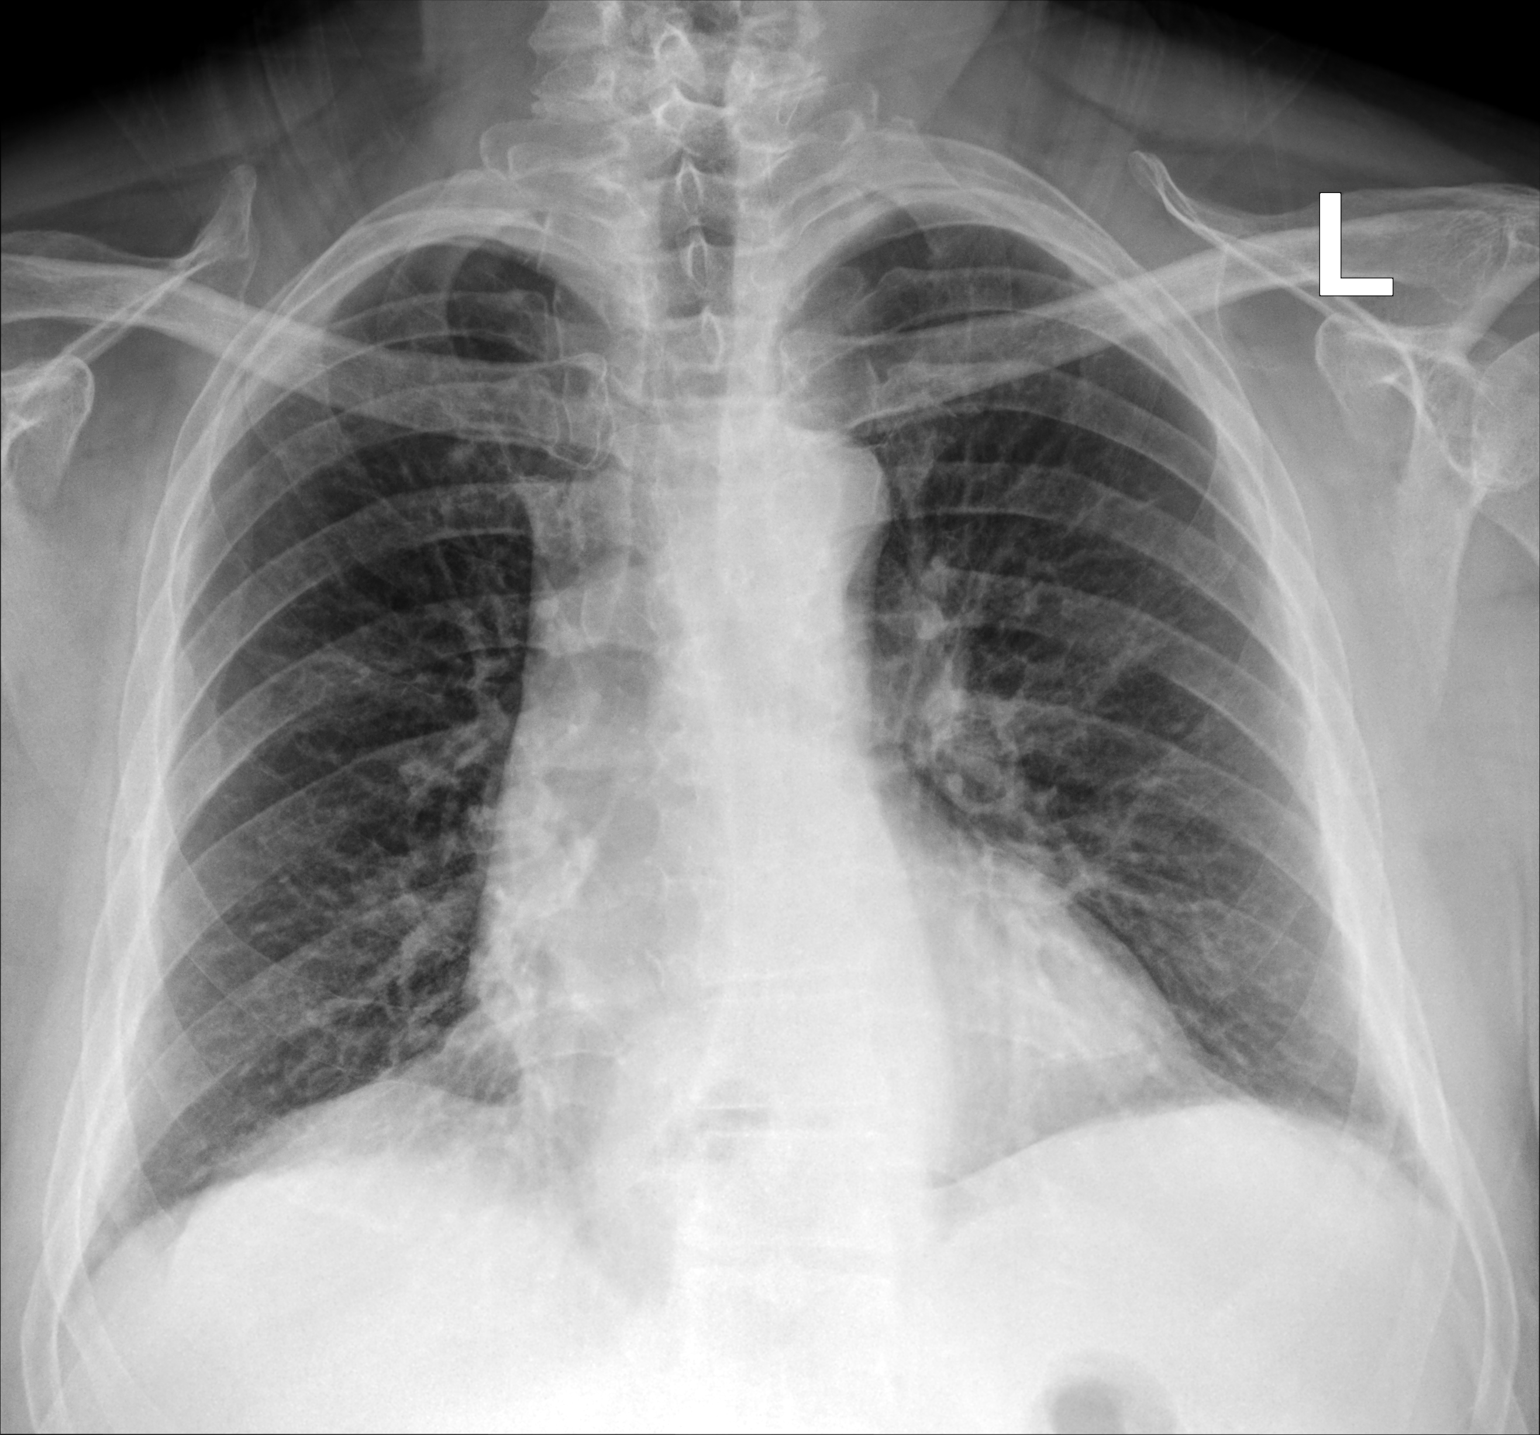

[chest lat]
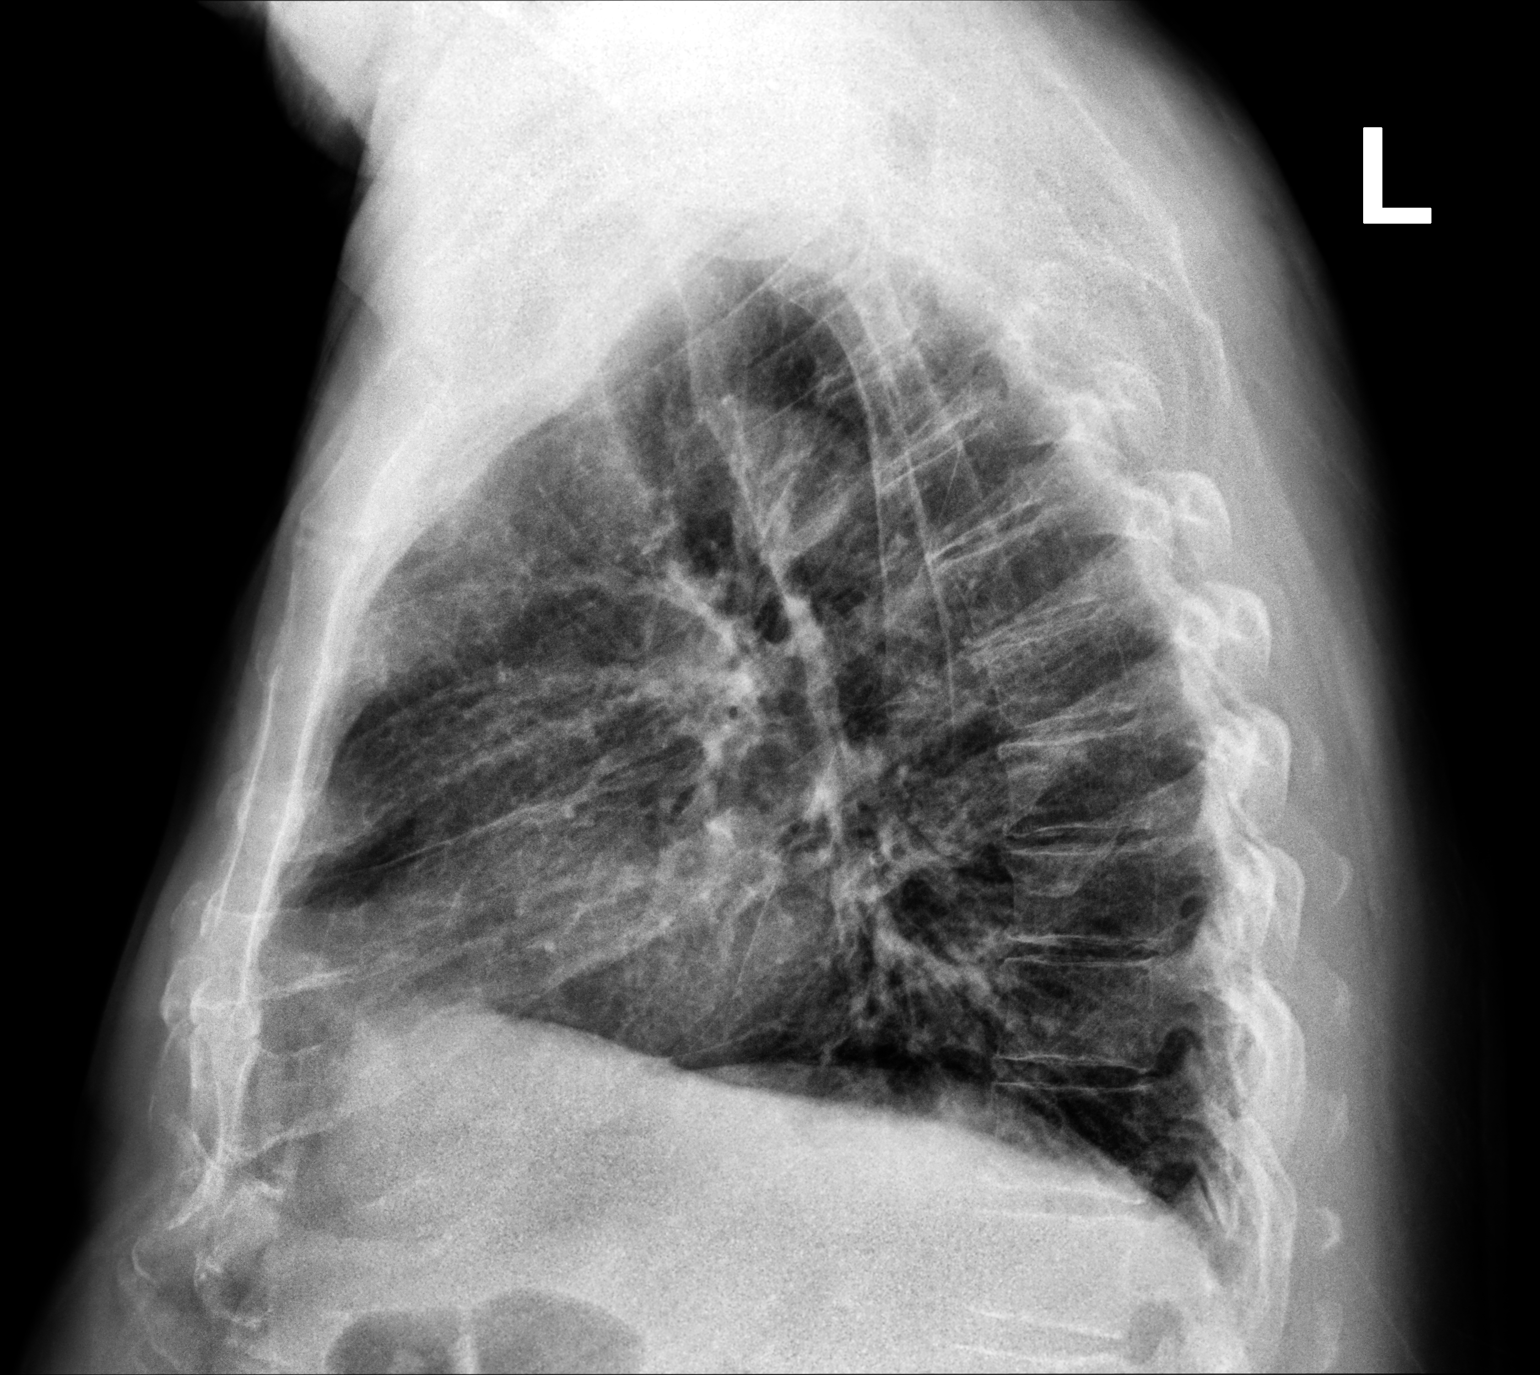

[2 of 2 positions shown; findings below may reference images not displayed]

FINDINGS: The lungs are clear without focal pneumonia, edema, pneumothorax or
pleural effusion. 7 mm nodule identified just inferior to the right
clavicle. The cardiopericardial silhouette is within normal limits
for size. Small hiatal hernia evident. The visualized bony
structures of the thorax show no acute abnormality.
IMPRESSION: 1. 7 mm right lung nodule. CT chest without contrast recommended to
further evaluate.
2. Small hiatal hernia
3. No acute cardiopulmonary findings.

## 2021-08-08 MED ORDER — BENZONATATE 100 MG PO CAPS: 200.0000 mg | ORAL_CAPSULE | Freq: Three times a day (TID) | ORAL | 0 refills | Status: DC | PRN

## 2021-08-08 MED ORDER — PREDNISONE 20 MG PO TABS: 20.0000 mg | ORAL_TABLET | Freq: Every day | ORAL | 0 refills | Status: AC

## 2021-08-08 MED ORDER — AZITHROMYCIN 250 MG PO TABS: ORAL_TABLET | ORAL | 0 refills | Status: DC

## 2021-08-08 MED ORDER — ALBUTEROL SULFATE HFA 108 (90 BASE) MCG/ACT IN AERS
2.0000 | INHALATION_SPRAY | Freq: Once | RESPIRATORY_TRACT | Status: AC
  Administered 2021-08-08: 09:00:00 2 via RESPIRATORY_TRACT

## 2021-08-08 NOTE — ED Triage Notes (Signed)
Pt here with chest congestion and cough after 1 week of flu-like sx. Pt wife flu positive and is admitted into the hospital for low SpO2.

## 2021-08-08 NOTE — ED Provider Notes (Signed)
Jeffery Reed    CSN: [id number] Arrival date & time: 08/08/21  0802      History   Chief Complaint Chief Complaint  Patient presents with   Cough   Chest Congestion    HPI Jeffery Reed is a 75 y.o. male.   HPI Patient presents today with a history of one week of cough, congestion, fever and shortness of breath x 1 week. Patient was not tested however suspected he has influenza as this wife tested positive for flue 3 days ago. He had URI symptoms which have improved, now he is mostly experiencing chest congestion, wheezing, and increased work of breathing which worsens at night. Past Medical History:  Diagnosis Date   Allergy    Anxiety    Cataract    beginning stage 01/22/2019   Chronic kidney disease    enlarge bladder   Diabetes mellitus without complication (HCC)    GERD (gastroesophageal reflux disease)    Gout    Hyperlipidemia    Hypertension    OSA (obstructive sleep apnea)    Pre-diabetes    Seizures ([place])    Sleep apnea    c pap    Patient Active Problem List   Diagnosis Date Noted   At risk for central sleep apnea 06/10/2021   Loud snoring 03/08/2021   History of sleep apnea 03/08/2021   Hx of resection of meningioma 03/08/2021   Retrognathia 03/08/2021   Seizure-like activity ([place]) 10/15/2020   Acute-on-chronic kidney injury ([place]) 10/15/2020   Seizures ([place]) 10/14/2020   Renal mass 01/08/2020   Noncompliance with CPAP treatment    Stage 3 chronic kidney disease ([place])    Steroid-induced hyperglycemia    Enlarged prostate    CKD stage 3 due to type 2 diabetes mellitus ([place]) 03/12/2019   Anxiety 02/18/2018   GERD (gastroesophageal reflux disease) 03/12/2016   Obesity (BMI 30.0-34.9) 06/01/2015   Medication management 08/19/2014   BPH/Prostatism 08/19/2014   Essential hypertension 08/17/2013   Type 2 diabetes mellitus with stage 3 chronic kidney disease, without long-term current use of insulin ([place]) 08/17/2013   Vitamin D deficiency  08/17/2013   Hyperlipidemia associated with type 2 diabetes mellitus ([place])    Testosterone deficiency    Gout    OSA (obstructive sleep apnea)     Past Surgical History:  Procedure Laterality Date   COLONOSCOPY     CRANIOTOMY Right 12/29/2019   Procedure: Craniotomy for Tumor;  Surgeon: Kristeen Miss, MD;  Location: [place];  Service: Neurosurgery;  Laterality: Right;  Craniotomy for Tumor Excision   FOOT SURGERY  10/2009   MYRINGOTOMY Left 02-2005   [place] Medications    Prior to Admission medications   Medication Sig Start Date End Date Taking? Authorizing Provider  azithromycin (ZITHROMAX) 250 MG tablet Take 2 tabs PO x 1 dose, then 1 tab PO QD x 4 days 08/08/21  Yes Scot Jun, FNP  benzonatate (TESSALON) 100 MG capsule Take 2 capsules (200 mg total) by mouth 3 (three) times daily as needed for cough. 08/08/21  Yes Scot Jun, FNP  predniSONE (DELTASONE) 20 MG tablet Take 1 tablet (20 mg total) by mouth daily with breakfast for 5 days. 08/08/21 08/13/21 Yes Scot Jun, FNP  acetaminophen (TYLENOL) 650 MG CR tablet Take 650 mg by mouth every 8 (eight) hours as needed for pain (or headaches).    [provider]  Cholecalciferol (VITAMIN D3)  10000 units capsule Take 10,000 Units by mouth at bedtime. Taking 5,000 IU QOD, alternating with 10,000    [provider]  citalopram (CELEXA) 20 MG tablet TAKE 1 TABLET BY MOUTH DAILY FOR MOOD 03/15/21   Liane Comber, NP  Dulaglutide (TRULICITY) 3 ZT/2.4PY SOPN Inject 3 mg as directed once a week. 06/24/21   Unk Pinto, MD  fenofibrate 160 MG tablet TAKE 1 TABLET BY MOUTH  DAILY FOR TRIGLYCERIDES  (BLOOD FATS) 04/22/21   Liane Comber, NP  fexofenadine (ALLEGRA) 180 MG tablet Take 180 mg by mouth daily as needed for allergies or rhinitis.    [provider]  fluticasone (FLONASE) 50 MCG/ACT nasal spray Place 2 sprays into both nostrils daily as needed for  allergies or rhinitis. 10/16/20   Aline August, MD  glipiZIDE (GLUCOTROL) 5 MG tablet TAKE 1 TO 2 TABLETS BY  MOUTH 3 TIMES DAILY WITH  MEALS FOR DIABETES 07/01/21   Unk Pinto, MD  glucose blood (FREESTYLE LITE) test strip Check blood sugar 1 time daily-DX-E11.22 01/30/20   Vladimir Crofts, PA-C  levETIRAcetam (KEPPRA) 500 MG tablet Take 1 tablet (500 mg total) by mouth 2 (two) times daily. 12/29/20   Frann Rider, NP  metFORMIN (GLUCOPHAGE) 500 MG tablet Take 1 tablet (500 mg total) by mouth daily with supper. 01/19/21   Philemon Kingdom, MD  mupirocin ointment (BACTROBAN) 2 % APPLY TWICE A DAY TO SKIN INFECTION. 09/23/20   Unk Pinto, MD  omeprazole (PRILOSEC) 40 MG capsule TAKE 1 CAPSULE BY MOUTH  DAILY TO PREVENT  INDIGESTION & HEARTBURN 04/22/21   Liane Comber, NP  pyridOXINE (B-6) 50 MG tablet Take 1 tablet (50 mg total) by mouth daily. 10/16/20   Aline August, MD  rosuvastatin (CRESTOR) 10 MG tablet Take  1 tablet  Daily  for Cholesterol 01/25/21   Unk Pinto, MD  valsartan (DIOVAN) 160 MG tablet Take  1 tablet  Daily  for BP & Diabetic Kidney Protection  / Patient knows to take by mouth  ! 04/03/21   Unk Pinto, MD    Family History Family History  Problem Relation Age of Onset   Hypertension Mother    GER disease Mother    Hyperlipidemia Father    Hypertension Father    Parkinson's disease Father    Hypertension Brother    Diabetes Maternal Grandmother    Stroke Paternal Grandmother    Diabetes Paternal Grandmother    Stroke Paternal Grandfather    Colon cancer Neg Hx    Colon polyps Neg Hx    Esophageal cancer Neg Hx    Stomach cancer Neg Hx    Rectal cancer Neg Hx     Social History Social History   Tobacco Use   Smoking status: Never   Smokeless tobacco: Never  Vaping Use   Vaping Use: Never used  Substance Use Topics   Alcohol use: No   Drug use: No     Allergies   Cardura [doxazosin mesylate], Codeine, and Hytrin  [terazosin]   Review of Systems Review of Systems Pertinent negatives listed in HPI   Physical Exam Triage Vital Signs ED Triage Vitals  Enc Vitals Group     BP 08/08/21 0811 (!) 143/90     Pulse Rate 08/08/21 0811 64     Resp 08/08/21 0811 18     Temp 08/08/21 0811 99.6 F (37.6 C)     Temp src --      SpO2 08/08/21 0811 93 %  Weight --      Height --      Head Circumference --      Peak Flow --      Pain Score 08/08/21 0814 4     Pain Loc --      Pain Edu? --      Excl. in [place]? --    No data found.  Updated Vital Signs BP (!) 143/90    Pulse 64    Temp 99.6 F (37.6 C)    Resp 18    SpO2 93%   Visual Acuity Right Eye Distance:   Left Eye Distance:   Bilateral Distance:    Right Eye Near:   Left Eye Near:    Bilateral Near:     Physical Exam Constitutional:      Appearance: He is ill-appearing.  HENT:     Head: Normocephalic and atraumatic.     Nose: Nose normal.  Eyes:     Extraocular Movements: Extraocular movements intact.     Pupils: Pupils are equal, round, and reactive to light.  Cardiovascular:     Rate and Rhythm: Normal rate and regular rhythm.  Pulmonary:     Effort: No tachypnea, accessory muscle usage or prolonged expiration.     Breath sounds: No transmitted upper airway sounds. Wheezing and rhonchi present.  Neurological:     General: No focal deficit present.     Mental Status: He is alert and oriented to person, place, and time.  Psychiatric:        Mood and Affect: Mood normal.        Behavior: Behavior normal.        Thought Content: Thought content normal.        Judgment: Judgment normal.     UC Treatments / Results  Labs (all labs ordered are listed, but only abnormal results are displayed) Labs Reviewed - No data to display  EKG   Radiology DG Chest 2 View  Result Date: 08/08/2021 CLINICAL DATA:  Cough. EXAM: CHEST - 2 VIEW COMPARISON:  09/22/2008 FINDINGS: The lungs are clear without focal pneumonia, edema,  pneumothorax or pleural effusion. 7 mm nodule identified just inferior to the right clavicle. The cardiopericardial silhouette is within normal limits for size. Small hiatal hernia evident. The visualized bony structures of the thorax show no acute abnormality. IMPRESSION: 1. 7 mm right lung nodule. CT chest without contrast recommended to further evaluate. 2. Small hiatal hernia 3. No acute cardiopulmonary findings. Electronically Signed   By: Misty Stanley M.D.   On: 08/08/2021 08:41    Procedures Procedures (including critical care time)  Medications Ordered in UC Medications  albuterol (VENTOLIN HFA) 108 (90 Base) MCG/ACT inhaler 2 puff (2 puffs Inhalation Given 08/08/21 0856)    Initial Impression / Assessment and Plan / UC Course  I have reviewed the triage vital signs and the nursing notes.  Pertinent labs & imaging results that were available during my care of the patient were reviewed by me and considered in my medical decision making (see chart for details).    Treating for lower respiratory infection likely secondary to recent viral influenza like infection.  Chest x-ray is negative for pneumonia.  Incidental finding of a 7 mm right lung nodule discussed with patient to please follow-up with primary care doctor.  He has an appointment scheduled within the next 3 months and reports that he will contact PCP for further evaluation of lung nodule.  Albuterol inhaler 2 puffs every 4-6  hours as needed for wheezing or shortness of breath.  Azithromycin to cover for any underlying bacterial infection.  Prednisone 20 mg once daily for 5 days for bronchospasms, wheezing and chest inflammation.  Patient encouraged to drink plenty of fluids and adhere to diabetic diet as he is a diabetic with uncontrolled A1c of 6.5 while taking prednisone.  Strict return precautions if symptoms worsen or do not readily improve. Final Clinical Impressions(s) / UC Diagnoses   Final diagnoses:  Lower respiratory  infection (e.g., bronchitis, pneumonia, pneumonitis, pulmonitis)     Discharge Instructions      Chest x-ray negative for pneumonia. Follow-up with your PCP as a small isolated lung nodule on right upper lung as found incidentally on x-ray. Recommendation is for monitoring and to obtain a routine Chest CT to further evaluation the presence of lung nodule. I am treating you for acute bronchitis with Azithromycin and low dose prednisone. Prednisone will temporarily increase your blood sugar therefore drink plently of water and avoid high fat and high sugar foods while taking this medication.      ED Prescriptions     Medication Sig Dispense Auth. Provider   predniSONE (DELTASONE) 20 MG tablet Take 1 tablet (20 mg total) by mouth daily with breakfast for 5 days. 5 tablet Scot Jun, FNP   azithromycin (ZITHROMAX) 250 MG tablet Take 2 tabs PO x 1 dose, then 1 tab PO QD x 4 days 6 tablet Scot Jun, FNP   benzonatate (TESSALON) 100 MG capsule Take 2 capsules (200 mg total) by mouth 3 (three) times daily as needed for cough. 40 capsule Scot Jun, FNP      PDMP not reviewed this encounter.   Scot Jun, [place] 08/08/21 [phone]

## 2021-08-08 NOTE — Discharge Instructions (Addendum)
Chest x-ray negative for pneumonia. Follow-up with your PCP as a small isolated lung nodule on right upper lung as found incidentally on x-ray. Recommendation is for monitoring and to obtain a routine Chest CT to further evaluation the presence of lung nodule. I am treating you for acute bronchitis with Azithromycin and low dose prednisone. Prednisone will temporarily increase your blood sugar therefore drink plently of water and avoid high fat and high sugar foods while taking this medication.

## 2021-08-18 ENCOUNTER — Other Ambulatory Visit: Payer: Self-pay | Admitting: Adult Health

## 2021-08-18 DIAGNOSIS — I10 Essential (primary) hypertension: Secondary | ICD-10-CM

## 2021-08-25 ENCOUNTER — Ambulatory Visit: Payer: Medicare Other | Admitting: Internal Medicine

## 2021-08-25 NOTE — Progress Notes (Deleted)
Patient ID: IBAN UTZ, male   DOB: Jan 14, 1946, 76 y.o.   MRN: [id number]   This visit occurred during the SARS-CoV-2 public health emergency.  Safety protocols were in place, including screening questions prior to the visit, additional usage of staff PPE, and extensive cleaning of exam room while observing appropriate contact time as indicated for disinfecting solutions.   HPI: Jeffery Reed is a 76 y.o.-year-old male, initially referred by his PCP, Unk Pinto, MD, returning for follow-up for DM2, dx in 2018, insulin-dependent since 12/2019, uncontrolled, with complications (CKD stage III, diabetic retinopathy).  Last visit 3 months ago.  Interim history: He continues to have increased urination, but no blurry vision, nausea, chest pain. He was at urgent care on 08/08/2021 for lower respiratory infection.  Reviewed HbA1c levels: Lab Results  Component Value Date   HGBA1C 6.5 (H) 06/15/2021   HGBA1C 6.0 (H) 03/03/2021   HGBA1C 6.7 (A) 01/19/2021   HGBA1C 5.7 (A) 09/20/2020   HGBA1C 5.5 05/20/2020   HGBA1C 6.3 (H) 03/02/2020   HGBA1C 8.1 (H) 12/29/2019   HGBA1C 7.5 (H) 09/18/2019   HGBA1C 9.4 (H) 06/18/2019   HGBA1C 8.8 (H) 03/17/2019   Pt is on a regimen of: - Metformin ER 1000 mg 2x a day, with meals >> off Metformin since end of 09/2020 >> restarted 500 mg with dinner 01/2021 - Glipizide 05-25-09 >> 10-10 >> 05-25-09 >> 5-10 mg 2x a day, before meals - Trulicity 4.09 mg weekly - started 01/2021 >> 1.5 mg weekly -  >> stopped 04/2020  Pt checks his sugars once a day: - am: 65-103  >> 68, 80-106 >> 97-169, 175, 198 >> 82-101 - 2h after b'fast: 131, 136, 172 >> n/c >> 100, 148, 243 >> 111-167 - before lunch: 158, 177, 199 (Christmas) >> n/c >> 144-167 - 2h after lunch: 202 >> n/c >> 118 >> 125, 127 >> n/c - before dinner: n/c >> 103, 111 >> n/c >> 126, 301 >> 187 - 2h after dinner: n/c >> 234 >> n/c >> 240 - bedtime: n/c >> 92, 230 >> n/c >> 94 - nighttime:  n/c Lowest sugar was 65 >> 68 >> 97 >> 82; he has hypoglycemia awareness at 100. Highest sugar was 234 >> 301 >> 240.  Glucometer: Freestyle  Pt's meals are: - Breakfast:  Instant oatmeal or [employer] sausage bisuit or scrambled eggs - Lunch: tomato sandwich, ham - Dinner: larger meal: potatoes; spaghetti, lasagna, chicken, veggies: green beans, corn, broccoli - Snacks:potato chips, apples Diet sodas, unsweet tea,  -+ CKD stage 3b-sees nephrology, last BUN/creatinine:  Lab Results  Component Value Date   BUN 24 06/15/2021   BUN 24 03/03/2021   CREATININE 1.85 (H) 06/15/2021   CREATININE 1.97 (H) 03/03/2021  02/2021: GFR 35 On valsartan 320.  -+ HL; last set of lipids: Lab Results  Component Value Date   CHOL 156 06/15/2021   HDL 31 (L) 06/15/2021   LDLCALC 96 06/15/2021   TRIG 202 (H) 06/15/2021   CHOLHDL 5.0 (H) 06/15/2021  On Crestor 10, fenofibrate 160.  - last eye exam was on 12/30/2020: + DR, + small cataract.   -+ Mild, occasional numbness and tingling in his feet.  Pt has no FH of DM.  He also has HTN, OSA. Patient has a history of meningioma surgery in 2021.  He recovered well afterwards.  However, he had a seizure in 2022  >> started Keppra. He had a slightly increased TSH at last check: Lab Results  Component Value Date   TSH 5.08 (H) 03/03/2021   TSH 3.53 09/23/2020   TSH 2.43 03/02/2020   TSH 1.45 01/14/2020   TSH 1.49 09/18/2019   TSH 4.31 06/18/2019   TSH 3.44 03/17/2019   TSH 2.54 12/12/2018   TSH 2.21 09/12/2018   TSH 2.38 06/03/2018   He is a retired Airline pilot.  During the winter, he drives a schoolbus.   ROS: + See HPI  I reviewed pt's medications, allergies, PMH, social hx, family hx, and changes were documented in the history of present illness. Otherwise, unchanged from my initial visit note.  Past Medical History:  Diagnosis Date   Allergy    Anxiety    Cataract    beginning stage 01/22/2019   Chronic kidney disease    enlarge  bladder   Diabetes mellitus without complication (HCC)    GERD (gastroesophageal reflux disease)    Gout    Hyperlipidemia    Hypertension    OSA (obstructive sleep apnea)    Pre-diabetes    Seizures ([place])    Sleep apnea    c pap   Past Surgical History:  Procedure Laterality Date   COLONOSCOPY     CRANIOTOMY Right 12/29/2019   Procedure: Craniotomy for Tumor;  Surgeon: Kristeen Miss, MD;  Location: [place];  Service: Neurosurgery;  Laterality: Right;  Craniotomy for Tumor Excision   FOOT SURGERY  10/2009   MYRINGOTOMY Left 02-2005   Dr.Newman   TONSILLECTOMY  1953   Social History   Socioeconomic History   Marital status: Married    Spouse name: Not on file   Number of children: Not on file   Years of education: Not on file   Highest education level: Not on file  Occupational History   Occupation: Recruitment consultant    Comment: on STD 10/25/20  Tobacco Use   Smoking status: Never   Smokeless tobacco: Never  Vaping Use   Vaping Use: Never used  Substance and Sexual Activity   Alcohol use: No   Drug use: No   Sexual activity: Not on file  Other Topics Concern   Not on file  Social History Narrative   Lives with wife   Right handed   Drinks 3-4 cups caffeine daily   Social Determinants of Health   Financial Resource Strain: Not on file  Food Insecurity: Not on file  Transportation Needs: Not on file  Physical Activity: Not on file  Stress: Not on file  Social Connections: Not on file  Intimate Partner Violence: Not on file   Current Outpatient Medications on File Prior to Visit  Medication Sig Dispense Refill   acetaminophen (TYLENOL) 650 MG CR tablet Take 650 mg by mouth every 8 (eight) hours as needed for pain (or headaches).     azithromycin (ZITHROMAX) 250 MG tablet Take 2 tabs PO x 1 dose, then 1 tab PO QD x 4 days 6 tablet 0   benzonatate (TESSALON) 100 MG capsule Take 2 capsules (200 mg total) by mouth 3 (three) times daily as needed for cough. 40 capsule 0    Cholecalciferol (VITAMIN D3) 10000 units capsule Take 10,000 Units by mouth at bedtime. Taking 5,000 IU QOD, alternating with 10,000     citalopram (CELEXA) 20 MG tablet TAKE 1 TABLET BY MOUTH DAILY FOR MOOD 90 tablet 3   Dulaglutide (TRULICITY) 3 VV/7.4MO SOPN Inject 3 mg as directed once a week. 6 mL 0   fenofibrate 160 MG tablet TAKE 1 TABLET BY MOUTH  DAILY FOR TRIGLYCERIDES  (BLOOD FATS) 90 tablet 3   fexofenadine (ALLEGRA) 180 MG tablet Take 180 mg by mouth daily as needed for allergies or rhinitis.     fluticasone (FLONASE) 50 MCG/ACT nasal spray Place 2 sprays into both nostrils daily as needed for allergies or rhinitis.     glipiZIDE (GLUCOTROL) 5 MG tablet TAKE 1 TO 2 TABLETS BY  MOUTH 3 TIMES DAILY WITH  MEALS FOR DIABETES 540 tablet 3   glucose blood (FREESTYLE LITE) test strip Check blood sugar 1 time daily-DX-E11.22 100 each 5   levETIRAcetam (KEPPRA) 500 MG tablet Take 1 tablet (500 mg total) by mouth 2 (two) times daily. 180 tablet 3   metFORMIN (GLUCOPHAGE) 500 MG tablet Take 1 tablet (500 mg total) by mouth daily with supper. 90 tablet 3   mupirocin ointment (BACTROBAN) 2 % APPLY TWICE A DAY TO SKIN INFECTION. 66 g 1   omeprazole (PRILOSEC) 40 MG capsule TAKE 1 CAPSULE BY MOUTH  DAILY TO PREVENT  INDIGESTION & HEARTBURN 90 capsule 3   pyridOXINE (B-6) 50 MG tablet Take 1 tablet (50 mg total) by mouth daily. 30 tablet 0   rosuvastatin (CRESTOR) 10 MG tablet Take  1 tablet  Daily  for Cholesterol 90 tablet 3   valsartan (DIOVAN) 160 MG tablet Take  1 tablet  Daily  for BP & Diabetic Kidney Protection  / Patient knows to take by mouth  ! 90 tablet 1   No current facility-administered medications on file prior to visit.   Allergies  Allergen Reactions   Cardura [Doxazosin Mesylate] Other (See Comments)    Nasal congestion   Codeine Other (See Comments)    The patient passed out   Hytrin [Terazosin] Other (See Comments)    Reaction not recalled   Family History  Problem  Relation Age of Onset   Hypertension Mother    GER disease Mother    Hyperlipidemia Father    Hypertension Father    Parkinson's disease Father    Hypertension Brother    Diabetes Maternal Grandmother    Stroke Paternal Grandmother    Diabetes Paternal Grandmother    Stroke Paternal Grandfather    Colon cancer Neg Hx    Colon polyps Neg Hx    Esophageal cancer Neg Hx    Stomach cancer Neg Hx    Rectal cancer Neg Hx    PE: There were no vitals taken for this visit. Wt Readings from Last 3 Encounters:  07/06/21 226 lb (102.5 kg)  06/15/21 228 lb (103.4 kg)  04/21/21 222 lb 9.6 oz (101 kg)   Constitutional: overweight, in NAD, + shuffling gait Eyes: PERRLA, EOMI, no exophthalmos ENT: moist mucous membranes, no thyromegaly, no cervical lymphadenopathy Cardiovascular: RRR, No MRG Respiratory: CTA B Musculoskeletal: no deformities, strength intact in all 4 Skin: moist, warm, no rashes Neurological: + mild tremor with outstretched R hand, DTR normal in all 4  ASSESSMENT: 1. DM2, insulin-dependent, previously uncontrolled, with long-term complications - CKD stage 3 - DR  2.  Hyperlipidemia  3. Obesity class I  PLAN:  1. Patient with longstanding, type 2 diabetes, previously on insulin, now off basal insulin after an HbA1c decreased to less than 6%.  Metformin was held before, when GFR decreased to 37, but afterwards, his kidney function improved so we restarted the low-dose metformin, 500 mg with dinner.  At last visit I advised him to increase Trulicity and to also decrease the dose of glipizide if possible after this.  He had another HbA1c, of 6.5% 2.5 months ago.  This was slightly higher than the previous HbA1c which was 6.0%.  However, it was still at goal.  - I suggested to:  Patient Instructions  Please continue: - Metformin 500 mg with dinner - Glipizide 5-10 mg before b'fast and dinner - Trulicity 1.5 mg weekly  Please return in 3-4 months with your sugar log.    - we checked his HbA1c: 7%  - advised to check sugars at different times of the day - 1x a day, rotating check times - advised for yearly eye exams >> he is UTD - return to clinic in 3-4 months  2. HL -Reviewed latest lipid panel from 05/2021: LDL above our target of less than 70, the rest of the fractions also abnormal: Lab Results  Component Value Date   CHOL 156 06/15/2021   HDL 31 (L) 06/15/2021   LDLCALC 96 06/15/2021   TRIG 202 (H) 06/15/2021   CHOLHDL 5.0 (H) 06/15/2021  -he continues on Crestor 10 mg daily and fenofibrate 160 mg daily without side effects  3.  Obesity class I -We were able to stop Lantus in 2021, which was also contributing to weight gain.  At last visit, I advised him to increase Trulicity and decrease glipizide, both measures should help with weight loss. -He gained 6 pounds before last visit.   Philemon Kingdom, MD PhD [facility] Endocrinology

## 2021-08-31 ENCOUNTER — Telehealth: Payer: Self-pay | Admitting: Adult Health

## 2021-08-31 NOTE — Telephone Encounter (Signed)
Pt was scheduled her Initial CPAP visit on 11/02/21. Pt was informed to bring machine and power cord to appt. DME: [facility] Phone: [phone] Fax: [phone] Equipment issued; AirsSense 10 Auto on 08/31/21 Pt to be scheduled between: 09/30/21-11/29/21

## 2021-09-05 ENCOUNTER — Ambulatory Visit: Payer: Medicare Other | Admitting: Internal Medicine

## 2021-09-13 ENCOUNTER — Ambulatory Visit: Payer: Medicare Other | Admitting: Internal Medicine

## 2021-09-14 ENCOUNTER — Other Ambulatory Visit: Payer: Self-pay

## 2021-09-14 ENCOUNTER — Ambulatory Visit (INDEPENDENT_AMBULATORY_CARE_PROVIDER_SITE_OTHER): Payer: Medicare Other | Admitting: Internal Medicine

## 2021-09-14 ENCOUNTER — Encounter: Payer: Self-pay | Admitting: Internal Medicine

## 2021-09-14 VITALS — BP 136/84 | HR 80 | Temp 97.9°F | Resp 18 | Ht 70.0 in | Wt 227.2 lb

## 2021-09-14 DIAGNOSIS — E1122 Type 2 diabetes mellitus with diabetic chronic kidney disease: Secondary | ICD-10-CM | POA: Diagnosis not present

## 2021-09-14 DIAGNOSIS — I10 Essential (primary) hypertension: Secondary | ICD-10-CM | POA: Diagnosis not present

## 2021-09-14 DIAGNOSIS — E1169 Type 2 diabetes mellitus with other specified complication: Secondary | ICD-10-CM | POA: Diagnosis not present

## 2021-09-14 DIAGNOSIS — G4733 Obstructive sleep apnea (adult) (pediatric): Secondary | ICD-10-CM

## 2021-09-14 DIAGNOSIS — R569 Unspecified convulsions: Secondary | ICD-10-CM | POA: Diagnosis not present

## 2021-09-14 DIAGNOSIS — E559 Vitamin D deficiency, unspecified: Secondary | ICD-10-CM | POA: Diagnosis not present

## 2021-09-14 DIAGNOSIS — E785 Hyperlipidemia, unspecified: Secondary | ICD-10-CM

## 2021-09-14 DIAGNOSIS — Z79899 Other long term (current) drug therapy: Secondary | ICD-10-CM

## 2021-09-14 DIAGNOSIS — Z9989 Dependence on other enabling machines and devices: Secondary | ICD-10-CM | POA: Diagnosis not present

## 2021-09-14 DIAGNOSIS — N1832 Chronic kidney disease, stage 3b: Secondary | ICD-10-CM | POA: Diagnosis not present

## 2021-09-14 NOTE — Patient Instructions (Signed)

## 2021-09-14 NOTE — Progress Notes (Signed)
Future Appointments  Date Time Provider Department  09/14/2021    6 mo  11:30 AM Unk Pinto, MD [facility]  10/04/2021 11:00 AM Newton Pigg, [facility]  11/02/2021  2:45 PM Frann Rider, NP [facility]  06/15/2022       Wellness 11:00 AM Magda Bernheim, NP [facility]    History of Present Illness:       This very nice 76 y.o. MWM presents for 6  month follow up with HTN, HLD, T2_NIDDM  and Vitamin D Deficiency.  In May 2020, patient had craniotomy for excision of a Frontal lobe meningioma and has been on Keppra since. Patient is followed by Dr Brett Fairy on CPAP for OSA with improved restorative sleep.        Patient is treated for HTN (1992) & BP has been controlled at home. Todays BP was initially elevated & rechecked at goal :. Patient has had no complaints of any cardiac type chest pain, palpitations, dyspnea / orthopnea / PND, dizziness, claudication, or dependent edema.       Hyperlipidemia is controlled with diet & meds. Patient denies myalgias or other med SEs. Last Lipids were at goal except elevated Trig's :   Lab Results  Component Value Date   CHOL 156 06/15/2021   HDL 31 (L) 06/15/2021   LDLCALC 96 06/15/2021   TRIG 202 (H) 06/15/2021   CHOLHDL 5.0 (H) 06/15/2021    Also, the patient has history of  PreDiabetes (2008) and then T2_NIDDM  (2014) & started Insulin in 2021 after craniotomy for a meningioma.  As he improved , he tapered off of insulin and back on Metformin & Glucotrol. Marland Kitchen  He does have CKD 3b  w /(GFR 38) . and has had no symptoms of reactive hypoglycemia, diabetic polys, paresthesias or visual blurring.  Last A1c was not at goal:  Lab Results  Component Value Date   HGBA1C 6.5 (H) 06/15/2021                                                       Further, the patient also has history of Vitamin D Deficiency ("22" / 2008) and supplements vitamin D without any suspected side-effects. Last vitamin D was at goal :  Lab Results  Component Value  Date   VD25OH 72 03/03/2021     Current Outpatient Medications on File Prior to Visit  Medication Sig   acetaminophen ) 650 MG CR tablet Take 650 mg every 8 hours as needed for pain    VITAMIN 5,000 units  Taking 5,000 IU QOD, alternating with 10,000   citalopram  20 MG tablet TAKE 1 TABLET BY MOUTH DAILY FOR MOOD   Dulaglutide (TRULICITY) 3 EH/2.0NO SOPN Inject 3 mg as directed once a week.   fenofibrate 160 MG tablet TAKE 1 TABLET  DAILY    fexofenadine  180 MG tablet Take  daily as needed    FLONASE nasal spray Place 2 sprays into both nostrils daily as needed    glipiZIDE  5 MG tablet TAKE 1 TO 2 TABS  3 TIMES DAILY    levETIRAcetam (KEPPRA) 500 MG tablet Take 1 tablet 2 times daily.   metFORMIN 500 MG tablet Take 1 tablet daily with supper.   mupirocin oint 2 % APPLY TWICE A DAY TO SKIN INFECTION.  omeprazole 40 MG capsule TAKE 1 CAPSULE  DAILY    pyridOXINE (B-6) 50 MG tablet Take 1 tablet  daily.   rosuvastatin 10 MG tablet Take  1 tablet  Daily     valsartan  160 MG tablet Take  1 tablet  Daily     Allergies  Allergen Reactions   Cardura [Doxazosin Mesylate] Nasal congestion   Codeine The patient passed out   Hytrin [Terazosin] Reaction not recalled         PMHx:   Past Medical History:  Diagnosis Date   Allergy    Anxiety    Cataract    beginning stage 01/22/2019   Chronic kidney disease    enlarge bladder   Diabetes mellitus without complication (HCC)    GERD (gastroesophageal reflux disease)    Gout    Hyperlipidemia    Hypertension    OSA (obstructive sleep apnea)    Pre-diabetes    Seizures ([place])    Sleep apnea    c pap     Immunization History  Administered Date(s) Administered   DT  06/01/2015   Fluad Quad(high Dose ) 05/20/2020   Influenza 06/05/2013   Influenza, High Dose  06/22/2017, 06/11/2018, 05/13/2019, 05/17/2021   PFIZER SARS-COV-2 Vacc 09/27/2019, 10/20/2019   Pneumococcal -13 08/19/2014   Pneumococcal -23 08/22/2003, 12/10/2015    Tdap 03/08/2004   Zoster, Live 08/18/2013     Past Surgical History:  Procedure Laterality Date   COLONOSCOPY     CRANIOTOMY Right 12/29/2019   Craniotomy for Tumor;  Kristeen Miss, MD   FOOT SURGERY  10/2009   MYRINGOTOMY Left 02-2005   Dr.Newman   TONSILLECTOMY  1953    FHx:    Reviewed / unchanged  SHx:    Reviewed / unchanged   Systems Review:  Constitutional: Denies fever, chills, wt changes, headaches, insomnia, fatigue, night sweats, change in appetite. Eyes: Denies redness, blurred vision, diplopia, discharge, itchy, watery eyes.  ENT: Denies discharge, congestion, post nasal drip, epistaxis, sore throat, earache, hearing loss, dental pain, tinnitus, vertigo, sinus pain, snoring.  CV: Denies chest pain, palpitations, irregular heartbeat, syncope, dyspnea, diaphoresis, orthopnea, PND, claudication or edema. Respiratory: denies cough, dyspnea, DOE, pleurisy, hoarseness, laryngitis, wheezing.  Gastrointestinal: Denies dysphagia, odynophagia, heartburn, reflux, water brash, abdominal pain or cramps, nausea, vomiting, bloating, diarrhea, constipation, hematemesis, melena, hematochezia  or hemorrhoids. Genitourinary: Denies dysuria, frequency, urgency, nocturia, hesitancy, discharge, hematuria or flank pain. Musculoskeletal: Denies arthralgias, myalgias, stiffness, jt. swelling, pain, limping or strain/sprain.  Skin: Denies pruritus, rash, hives, warts, acne, eczema or change in skin lesion(s). Neuro: No weakness, tremor, incoordination, spasms, paresthesia or pain. Psychiatric: Denies confusion, memory loss or sensory loss. Endo: Denies change in weight, skin or hair change.  Heme/Lymph: No excessive bleeding, bruising or enlarged lymph nodes.  Physical Exam  BP (!) 150/89    Pulse 80    Temp 97.9 F (36.6 C)    Resp 18    Ht 5\' 10"  (1.778 m)    Wt 227 lb 3.2 oz (103.1 kg)    SpO2 96%    BMI 32.60 kg/m   Appears  over nourished, well groomed  and in no distress.  Eyes:  PERRLA, EOMs, conjunctiva no swelling or erythema. Sinuses: No frontal/maxillary tenderness ENT/Mouth: EAC's clear, TM's nl w/o erythema, bulging. Nares clear w/o erythema, swelling, exudates. Oropharynx clear without erythema or exudates. Oral hygiene is good. Tongue normal, non obstructing. Hearing intact.  Neck: Supple. Thyroid not palpable. Car 2+/2+ without bruits, nodes or  JVD. Chest: Respirations nl with BS clear & equal w/o rales, rhonchi, wheezing or stridor.  Cor: Heart sounds normal w/ regular rate and rhythm without sig. murmurs, gallops, clicks or rubs. Peripheral pulses normal and equal  without edema.  Abdomen: Soft & bowel sounds normal. Non-tender w/o guarding, rebound, hernias, masses or organomegaly.  Lymphatics: Unremarkable.  Musculoskeletal: Full ROM all peripheral extremities, joint stability, 5/5 strength and normal gait.  Skin: Warm, dry without exposed rashes, lesions or ecchymosis apparent.  Neuro: Cranial nerves intact, reflexes equal bilaterally. Sensory-motor testing grossly intact. Tendon reflexes grossly intact.  Pysch: Alert & oriented x 3.  Insight and judgement nl & appropriate. No ideations.  Assessment and Plan:  1. Essential hypertension  - Continue medication, monitor blood pressure at home.  - Continue DASH diet.  Reminder to go to the ER if any CP,  SOB, nausea, dizziness, severe HA, changes vision/speech.    - CBC with Differential/Platelet - COMPLETE METABOLIC PANEL WITH GFR - Magnesium - TSH  2. Hyperlipidemia associated with type 2 diabetes mellitus ([place])  - Continue diet/meds, exercise,& lifestyle modifications.  - Continue monitor periodic cholesterol/liver & renal functions    - Lipid panel - TSH  3. Type 2 diabetes mellitus with stage 3b chronic kidney  disease, without long-term current use of insulin (HCC)  - Continue diet, exercise  - Lifestyle modifications.  - Monitor appropriate labs   - Hemoglobin A1c  4. Vitamin D  deficiency  - Continue supplementation   - VITAMIN D 25 Hydroxy  5. Seizures (HCC)  - Levetiracetam, Immunoassay  6. OSA on CPAP   7. Medication management  - CBC with Differential/Platelet - COMPLETE METABOLIC PANEL WITH GFR - Magnesium - Lipid panel - TSH - Hemoglobin A1c - VITAMIN D 25 Hydroxy - Levetiracetam, Immunoassay        Discussed  regular exercise, BP monitoring, weight control to achieve/maintain BMI less than 25 and discussed med and SE's. Recommended labs to assess and monitor clinical status with further disposition pending results of labs.  I discussed the assessment and treatment plan with the patient. The patient was provided an opportunity to ask questions and all were answered. The patient agreed with the plan and demonstrated an understanding of the instructions.  I provided over 30 minutes of exam, counseling, chart review and  complex critical decision making.       The patient was advised to call back or seek an in-person evaluation if the symptoms worsen or if the condition fails to improve as anticipated.   Kirtland Bouchard, MD

## 2021-09-15 LAB — CBC WITH DIFFERENTIAL/PLATELET
Absolute Monocytes: 616 cells/uL (ref 200–950)
Basophils Absolute: 62 cells/uL (ref 0–200)
Basophils Relative: 0.8 %
Eosinophils Absolute: 289 cells/uL (ref 15–500)
Eosinophils Relative: 3.7 %
HCT: 41.5 % (ref 38.5–50.0)
Hemoglobin: 13.9 g/dL (ref 13.2–17.1)
Lymphs Abs: 1872 cells/uL (ref 850–3900)
MCH: 29.8 pg (ref 27.0–33.0)
MCHC: 33.5 g/dL (ref 32.0–36.0)
MCV: 89.1 fL (ref 80.0–100.0)
MPV: 10.3 fL (ref 7.5–12.5)
Monocytes Relative: 7.9 %
Neutro Abs: 4961 cells/uL (ref 1500–7800)
Neutrophils Relative %: 63.6 %
Platelets: 200 10*3/uL (ref 140–400)
RBC: 4.66 10*6/uL (ref 4.20–5.80)
RDW: 13.4 % (ref 11.0–15.0)
Total Lymphocyte: 24 %
WBC: 7.8 10*3/uL (ref 3.8–10.8)

## 2021-09-15 LAB — COMPLETE METABOLIC PANEL WITH GFR
AG Ratio: 1.8 (calc) (ref 1.0–2.5)
ALT: 14 U/L (ref 9–46)
AST: 18 U/L (ref 10–35)
Albumin: 4.5 g/dL (ref 3.6–5.1)
Alkaline phosphatase (APISO): 58 U/L (ref 35–144)
BUN/Creatinine Ratio: 12 (calc) (ref 6–22)
BUN: 22 mg/dL (ref 7–25)
CO2: 31 mmol/L (ref 20–32)
Calcium: 9.7 mg/dL (ref 8.6–10.3)
Chloride: 103 mmol/L (ref 98–110)
Creat: 1.79 mg/dL — ABNORMAL HIGH (ref 0.70–1.28)
Globulin: 2.5 g/dL (calc) (ref 1.9–3.7)
Glucose, Bld: 179 mg/dL — ABNORMAL HIGH (ref 65–99)
Potassium: 4.2 mmol/L (ref 3.5–5.3)
Sodium: 141 mmol/L (ref 135–146)
Total Bilirubin: 0.6 mg/dL (ref 0.2–1.2)
Total Protein: 7 g/dL (ref 6.1–8.1)
eGFR: 39 mL/min/{1.73_m2} — ABNORMAL LOW (ref >=60)

## 2021-09-15 LAB — LIPID PANEL
Cholesterol: 156 mg/dL (ref <200)
HDL: 29 mg/dL — ABNORMAL LOW (ref >=40)
LDL Cholesterol (Calc): 96 mg/dL (calc)
Non-HDL Cholesterol (Calc): 127 mg/dL (calc) (ref <130)
Total CHOL/HDL Ratio: 5.4 (calc) — ABNORMAL HIGH (ref <5.0)
Triglycerides: 217 mg/dL — ABNORMAL HIGH (ref <150)

## 2021-09-15 LAB — MAGNESIUM: Magnesium: 2 mg/dL (ref 1.5–2.5)

## 2021-09-15 LAB — HEMOGLOBIN A1C
Hgb A1c MFr Bld: 7.1 % of total Hgb — ABNORMAL HIGH (ref <5.7)
Mean Plasma Glucose: 157 mg/dL
eAG (mmol/L): 8.7 mmol/L

## 2021-09-15 LAB — TSH: TSH: 4.61 mIU/L — ABNORMAL HIGH (ref 0.40–4.50)

## 2021-09-15 LAB — VITAMIN D 25 HYDROXY (VIT D DEFICIENCY, FRACTURES): Vit D, 25-Hydroxy: 38 ng/mL (ref 30–100)

## 2021-09-15 NOTE — Progress Notes (Signed)
============================================================ °-   Test results slightly outside the reference range are not unusual. If there is anything important, I will review this with you,  otherwise it is considered normal test values.  If you have further questions,  please do not hesitate to contact me at the office or via My Chart.  ============================================================ ============================================================  -  Glucose  is elevated at 179 mg%  ( Goal is less than 120 ) and   - A1c = 7.1% - So   Recommend that you increase Metformin back to 3 x/ day with Meals !   - Diet is still important   !    Being diabetic has a  300% increased risk for heart attack,  stroke, cancer, and alzheimer- type vascular dementia.   - It is very important that you work harder with diet by  avoiding all foods that are white except chicken,   fish & calliflower.  - Avoid white rice  (brown & wild rice is OK),   - Avoid white potatoes  (sweet potatoes in moderation is OK),   White bread or wheat bread or anything made out of   white flour like bagels, donuts, rolls, buns, biscuits, cakes,  - pastries, cookies, pizza crust, and pasta (made from  white flour & egg whites)   - vegetarian pasta or spinach or wheat pasta is OK.  - Multigrain breads like Arnold's, Pepperidge Farm or   multigrain sandwich thins or high fiber breads like   [place] bread or "Dave's Killer" breads that are  4 to 5 grams fiber per slice !  are best.    Diet, exercise and weight loss can reverse and cure  diabetes in the early stages.    - Diet, exercise and weight loss is very important in the   control and prevention of complications of diabetes which  affects every system in your body, ie.   -Brain - dementia/stroke,  - eyes - glaucoma/blindness,  - heart - heart attack/heart failure,  - kidneys - dialysis,  - stomach - gastric paralysis,  - intestines -  malabsorption,  - nerves - severe painful neuritis,  - circulation - gangrene & loss of a leg(s)  - and finally  . . . . . . . . . . . . . . . . . .    - cancer and Alzheimers. ============================================================ ============================================================  -  Vitamin D= 38  ! ! !    Very Low ! ! !                                   Have you Stopped your Vitamin D   ?   -  Please restart & take Vitamin D 10,000 units EVERY day   ============================================================ ============================================================  -  All else OK   - Keppra  ( Levetiracetam ) level is still pending ============================================================ ============================================================

## 2021-09-17 ENCOUNTER — Encounter: Payer: Self-pay | Admitting: Internal Medicine

## 2021-09-19 ENCOUNTER — Ambulatory Visit: Payer: Medicare Other | Admitting: Adult Health

## 2021-09-20 ENCOUNTER — Telehealth: Payer: Self-pay

## 2021-09-20 NOTE — Telephone Encounter (Signed)
Called pt. to see if he would like to have visit on 2/14 instead on 3/14. Pt. did not pick  up. Will try calling again at a later time.  Total time spent: 2 minutes Vanetta Shawl, [facility]

## 2021-09-30 ENCOUNTER — Telehealth: Payer: Self-pay

## 2021-09-30 NOTE — Telephone Encounter (Signed)
'*  ERROR ON LAST ENCOUNTER*  Called patient to confirm upcoming visit w/ CPP. Pts. Wife r/s to 3/21 at 11:21KK d/t conflict in their schedule.   Total time spent: 5 minutes Vanetta Shawl, [facility]

## 2021-09-30 NOTE — Telephone Encounter (Signed)
Called patient to confirm upcoming visit w/ CPP. Pts. Wife r/s to 0/68 d/t conflict in their schedule.  Total time spent: 5 minutes Vanetta Shawl, [facility]

## 2021-10-02 ENCOUNTER — Other Ambulatory Visit: Payer: Self-pay | Admitting: Internal Medicine

## 2021-10-04 ENCOUNTER — Telehealth: Payer: Medicare Other | Admitting: Pharmacist

## 2021-10-05 ENCOUNTER — Other Ambulatory Visit: Payer: Self-pay | Admitting: Adult Health

## 2021-10-05 DIAGNOSIS — E782 Mixed hyperlipidemia: Secondary | ICD-10-CM

## 2021-10-05 DIAGNOSIS — N1832 Chronic kidney disease, stage 3b: Secondary | ICD-10-CM | POA: Diagnosis not present

## 2021-10-11 ENCOUNTER — Ambulatory Visit (INDEPENDENT_AMBULATORY_CARE_PROVIDER_SITE_OTHER): Payer: Medicare Other | Admitting: Internal Medicine

## 2021-10-11 ENCOUNTER — Other Ambulatory Visit: Payer: Self-pay

## 2021-10-11 ENCOUNTER — Encounter: Payer: Self-pay | Admitting: Internal Medicine

## 2021-10-11 VITALS — BP 128/82 | HR 62 | Ht 70.0 in | Wt 227.0 lb

## 2021-10-11 DIAGNOSIS — E1169 Type 2 diabetes mellitus with other specified complication: Secondary | ICD-10-CM

## 2021-10-11 DIAGNOSIS — E1122 Type 2 diabetes mellitus with diabetic chronic kidney disease: Secondary | ICD-10-CM | POA: Diagnosis not present

## 2021-10-11 DIAGNOSIS — N1832 Chronic kidney disease, stage 3b: Secondary | ICD-10-CM | POA: Diagnosis not present

## 2021-10-11 DIAGNOSIS — E785 Hyperlipidemia, unspecified: Secondary | ICD-10-CM

## 2021-10-11 MED ORDER — TRULICITY 3 MG/0.5ML ~~LOC~~ SOAJ
3.0000 mg | SUBCUTANEOUS | 0 refills | Status: DC
  Filled 2021-10-11: qty 2, 28d supply, fill #0
  Filled 2021-11-02: qty 2, 28d supply, fill #1
  Filled 2021-12-06: qty 2, 28d supply, fill #2

## 2021-10-11 NOTE — Patient Instructions (Addendum)
Please continue: - Metformin 500 mg 3x a day - Glipizide 10 mg before b'fast and dinner  Try to increase: - Trulicity 3 mg weekly  Please return in 3-4 months with your sugar log.

## 2021-10-11 NOTE — Progress Notes (Signed)
Patient ID: Jeffery Reed, male   DOB: November 19, 1945, 76 y.o.   MRN: [id number]   This visit occurred during the SARS-CoV-2 public health emergency.  Safety protocols were in place, including screening questions prior to the visit, additional usage of staff PPE, and extensive cleaning of exam room while observing appropriate contact time as indicated for disinfecting solutions.   HPI: Jeffery Reed is a 76 y.o.-year-old male, initially referred by his PCP, Jeffery Pinto, MD, returning for follow-up for DM2, dx in 2018, insulin-dependent since 12/2019, uncontrolled, with complications (CKD stage III, diabetic retinopathy).  Last visit 5.5 months ago.  Interim history: No blurry vision, nausea, chest pain. He continues to have increased urination.  Reviewed HbA1c levels: Lab Results  Component Value Date   HGBA1C 7.1 (H) 09/14/2021   HGBA1C 6.5 (H) 06/15/2021   HGBA1C 6.0 (H) 03/03/2021   HGBA1C 6.7 (A) 01/19/2021   HGBA1C 5.7 (A) 09/20/2020   HGBA1C 5.5 05/20/2020   HGBA1C 6.3 (H) 03/02/2020   HGBA1C 8.1 (H) 12/29/2019   HGBA1C 7.5 (H) 09/18/2019   HGBA1C 9.4 (H) 06/18/2019   Pt is on a regimen of: - Metformin ER 1000 mg 2x a day, with meals >> off Metformin 09/2020 >> restarted 500 mg with dinner 01/2021 >> 500 mg 3x a day - 1 mo ago - Glipizide 05-25-09 >> 10-10 >> 05-25-09 >> 10 mg 2x a day, before meals - Trulicity 6.26 mg weekly - started 01/2021 >> 1.5 mg weekly - missed 1 dose last week -  >> stopped 04/2020   Pt checks his sugars once a day: - am: 68, 80-106 >> 97-169, 175, 198 >> 94-854 >> off Trulicity 627 - 2h after b'fast: 131, 136, 172 ... >> 100, 148, 243 >> 111-167 >> n/c - before lunch: 158, 177, 199 (Christmas) >> n/c >> 144-167 >> n/c - 2h after lunch: 202 >> n/c >> 118 >> 125, 127 >> n/c - before dinner: 103, 111 >> n/c >> 126, 301 >> 035 >> off Trulicity: 009 - 2h after dinner: n/c >> 234 >> n/c >> 240 >> n/c - bedtime: n/c >> 92, 230 >> n/c >> 94 >> n/c -  nighttime: n/c Lowest sugar was 68 >> 97 >> 82 >> ?; he has hypoglycemia awareness at 100. Highest sugar was 301 >> 240 >> 214.  Glucometer: Freestyle  Pt's meals are: - Breakfast:  Instant oatmeal or [employer] sausage bisuit or scrambled eggs - Lunch: tomato sandwich, ham - Dinner: larger meal: potatoes; spaghetti, lasagna, chicken, veggies: green beans, corn, broccoli - Snacks:potato chips, apples Diet sodas, unsweet tea,  -+ CKD stage 3b-sees nephrology, last BUN/creatinine:  02/2021: GFR 35 Lab Results  Component Value Date   BUN 22 09/14/2021   BUN 24 06/15/2021   CREATININE 1.79 (H) 09/14/2021   CREATININE 1.85 (H) 06/15/2021  On valsartan 320.  -+ HL; last set of lipids: Lab Results  Component Value Date   CHOL 156 09/14/2021   HDL 29 (L) 09/14/2021   LDLCALC 96 09/14/2021   TRIG 217 (H) 09/14/2021   CHOLHDL 5.4 (H) 09/14/2021  On Crestor 10, fenofibrate 160.  - last eye exam was on 12/30/2020: + DR, + small cataract.   -+ Mild, occasional numbness and tingling in his feet.  Up-to-date with foot exam.  Pt has no FH of DM.  He also has HTN, OSA. Patient has a history of meningioma surgery in 2021.  He recovered well afterwards.  However, he had a seizure  in 2022  >> started Keppra.  He is a retired Airline pilot.  During the winter, he drives a schoolbus.   ROS: + See HPI  I reviewed pt's medications, allergies, PMH, social hx, family hx, and changes were documented in the history of present illness. Otherwise, unchanged from my initial visit note.  Past Medical History:  Diagnosis Date   Allergy    Anxiety    Cataract    beginning stage 01/22/2019   Chronic kidney disease    enlarge bladder   Diabetes mellitus without complication (HCC)    GERD (gastroesophageal reflux disease)    Gout    Hyperlipidemia    Hypertension    OSA (obstructive sleep apnea)    Pre-diabetes    Seizures ([place])    Sleep apnea    c pap   Past Surgical History:  Procedure  Laterality Date   COLONOSCOPY     CRANIOTOMY Right 12/29/2019   Procedure: Craniotomy for Tumor;  Surgeon: Kristeen Miss, MD;  Location: [facility];  Service: Neurosurgery;  Laterality: Right;  Craniotomy for Tumor Excision   FOOT SURGERY  10/2009   MYRINGOTOMY Left 02-2005   Dr.Newman   TONSILLECTOMY  1953   Social History   Socioeconomic History   Marital status: Married    Spouse name: Not on file   Number of children: Not on file   Years of education: Not on file   Highest education level: Not on file  Occupational History   Occupation: Recruitment consultant    Comment: on STD 10/25/20  Tobacco Use   Smoking status: Never   Smokeless tobacco: Never  Vaping Use   Vaping Use: Never used  Substance and Sexual Activity   Alcohol use: No   Drug use: No   Sexual activity: Not on file  Other Topics Concern   Not on file  Social History Narrative   Lives with wife   Right handed   Drinks 3-4 cups caffeine daily   Social Determinants of Health   Financial Resource Strain: Not on file  Food Insecurity: Not on file  Transportation Needs: Not on file  Physical Activity: Not on file  Stress: Not on file  Social Connections: Not on file  Intimate Partner Violence: Not on file   Current Outpatient Medications on File Prior to Visit  Medication Sig Dispense Refill   acetaminophen (TYLENOL) 650 MG CR tablet Take 650 mg by mouth every 8 (eight) hours as needed for pain (or headaches).     azithromycin (ZITHROMAX) 250 MG tablet Take 2 tabs PO x 1 dose, then 1 tab PO QD x 4 days (Patient not taking: Reported on 09/14/2021) 6 tablet 0   benzonatate (TESSALON) 100 MG capsule Take 2 capsules (200 mg total) by mouth 3 (three) times daily as needed for cough. (Patient not taking: Reported on 09/14/2021) 40 capsule 0   Cholecalciferol (VITAMIN D3) 10000 units capsule Take 10,000 Units by mouth at bedtime. Taking 5,000 IU QOD, alternating with 10,000     citalopram (CELEXA) 20 MG tablet TAKE 1 TABLET BY MOUTH  DAILY FOR MOOD 90 tablet 3   Dulaglutide (TRULICITY) 3 EX/5.2WU SOPN Inject 3 mg as directed once a week. 6 mL 0   fenofibrate 160 MG tablet TAKE 1 TABLET BY MOUTH  DAILY FOR TRIGLYCERIDES  (BLOOD FATS) 90 tablet 3   fexofenadine (ALLEGRA) 180 MG tablet Take 180 mg by mouth daily as needed for allergies or rhinitis.     fluticasone (FLONASE) 50 MCG/ACT  nasal spray Place 2 sprays into both nostrils daily as needed for allergies or rhinitis.     glipiZIDE (GLUCOTROL) 5 MG tablet TAKE 1 TO 2 TABLETS BY  MOUTH 3 TIMES DAILY WITH  MEALS FOR DIABETES 540 tablet 3   glucose blood (FREESTYLE LITE) test strip Check blood sugar 1 time daily-DX-E11.22 100 each 5   levETIRAcetam (KEPPRA) 500 MG tablet Take 1 tablet (500 mg total) by mouth 2 (two) times daily. 180 tablet 3   metFORMIN (GLUCOPHAGE) 500 MG tablet Take 1 tablet (500 mg total) by mouth daily with supper. 90 tablet 3   mupirocin ointment (BACTROBAN) 2 % APPLY TWICE A DAY TO SKIN INFECTION. 66 g 1   omeprazole (PRILOSEC) 40 MG capsule TAKE 1 CAPSULE BY MOUTH  DAILY TO PREVENT  INDIGESTION & HEARTBURN 90 capsule 3   pyridOXINE (B-6) 50 MG tablet Take 1 tablet (50 mg total) by mouth daily. 30 tablet 0   rosuvastatin (CRESTOR) 10 MG tablet TAKE 1 TABLET BY MOUTH  DAILY FOR CHOLESTEROL 90 tablet 3   valsartan (DIOVAN) 160 MG tablet Take  1 tablet  Daily  for BP & Diabetic Kidney Protection  / Patient knows to take by mouth  ! 90 tablet 1   No current facility-administered medications on file prior to visit.   Allergies  Allergen Reactions   Cardura [Doxazosin Mesylate] Other (See Comments)    Nasal congestion   Codeine Other (See Comments)    The patient passed out   Hytrin [Terazosin] Other (See Comments)    Reaction not recalled   Family History  Problem Relation Age of Onset   Hypertension Mother    GER disease Mother    Hyperlipidemia Father    Hypertension Father    Parkinson's disease Father    Hypertension Brother    Diabetes  Maternal Grandmother    Stroke Paternal Grandmother    Diabetes Paternal Grandmother    Stroke Paternal Grandfather    Colon cancer Neg Hx    Colon polyps Neg Hx    Esophageal cancer Neg Hx    Stomach cancer Neg Hx    Rectal cancer Neg Hx    PE: BP 128/82 (BP Location: Right Arm, Patient Position: Sitting, Cuff Size: Normal)    Pulse 62    Ht 5\' 10"  (1.778 m)    Wt 227 lb (103 kg)    SpO2 97%    BMI 32.57 kg/m  Wt Readings from Last 3 Encounters:  10/11/21 227 lb (103 kg)  09/14/21 227 lb 3.2 oz (103.1 kg)  07/06/21 226 lb (102.5 kg)   Constitutional: overweight, in NAD, + shuffling gait Eyes: PERRLA, EOMI, no exophthalmos ENT: moist mucous membranes, no thyromegaly, no cervical lymphadenopathy Cardiovascular: RRR, No MRG Respiratory: CTA B Musculoskeletal: no deformities, strength intact in all 4 Skin: moist, warm, no rashes Neurological: + mild tremor with outstretched R hand, DTR normal in all 4  ASSESSMENT: 1. DM2, insulin-dependent, previously uncontrolled, with long-term complications - CKD stage 3 - DR  2.  Hyperlipidemia  3. Obesity class I  PLAN:  1. Patient with longstanding, type 2 diabetes, previously on insulin, now off basal insulin after an HbA1c decreased to less than 6%.  Metformin was held last year after a GFR decreased to 37.  However, afterwards, kidney function improved and we restarted a low-dose metformin with dinner.  We continued sulfonylurea at last visit, but I advised him to decrease the dose as we also increased his Trulicity dose.  Sugars were excellent in the morning but they were increasing as the day went by.  HbA1c before last visit was 6.0%, however, since then, this increased, with the latest value being 7.1% less than a month ago. -At today's visit, he is taking the higher dose of metformin, as PCP increased risk when his HbA1c returned higher at last check.  He tolerates it well.  He brings his blood sugar sheets, however, he brought the  ones from last year, rather than the most recent ones for the last 2 months.  We only had 2 values checked from this month and they are high, in the 200s, since they were checked after he ran out of Trulicity.  At this visit we discussed about restarting Trulicity and increasing the dose to 3 mg weekly.  I sent it to the [facility], since his pharmacy does not have the 3 mg in stock (we called them during the appointment).  For now we will continue the same doses of metformin and glipizide. - I suggested to:  Patient Instructions  Please continue: - Metformin 500 mg 3x a day - Glipizide 10 mg before b'fast and dinner  Try to increase: - Trulicity 3 mg weekly  Please return in 3-4 months with your sugar log.   - advised to check sugars at different times of the day - 1x a day, rotating check times - advised for yearly eye exams >> he is UTD - return to clinic in 3-4 months  2. HL -Reviewed latest lipid panel from 08/2021: LDL above our goal of less than 70, triglycerides high, HDL low: Lab Results  Component Value Date   CHOL 156 09/14/2021   HDL 29 (L) 09/14/2021   LDLCALC 96 09/14/2021   TRIG 217 (H) 09/14/2021   CHOLHDL 5.4 (H) 09/14/2021  -He continues on Crestor 10 mg daily and fenofibrate 160 mg daily without side effect  3.  Obesity class I -At last visit, we increased his Trulicity dose and will increase it further today-this should also help with weight loss. -Before last visit, he gained 6 pounds, now stable weight  Philemon Kingdom, MD PhD [facility]

## 2021-10-12 ENCOUNTER — Other Ambulatory Visit: Payer: Self-pay

## 2021-10-14 DIAGNOSIS — N281 Cyst of kidney, acquired: Secondary | ICD-10-CM | POA: Diagnosis not present

## 2021-10-14 DIAGNOSIS — E559 Vitamin D deficiency, unspecified: Secondary | ICD-10-CM | POA: Diagnosis not present

## 2021-10-14 DIAGNOSIS — I129 Hypertensive chronic kidney disease with stage 1 through stage 4 chronic kidney disease, or unspecified chronic kidney disease: Secondary | ICD-10-CM | POA: Diagnosis not present

## 2021-10-14 DIAGNOSIS — E1122 Type 2 diabetes mellitus with diabetic chronic kidney disease: Secondary | ICD-10-CM | POA: Diagnosis not present

## 2021-10-14 DIAGNOSIS — R778 Other specified abnormalities of plasma proteins: Secondary | ICD-10-CM | POA: Diagnosis not present

## 2021-10-14 DIAGNOSIS — N1832 Chronic kidney disease, stage 3b: Secondary | ICD-10-CM | POA: Diagnosis not present

## 2021-10-14 DIAGNOSIS — N400 Enlarged prostate without lower urinary tract symptoms: Secondary | ICD-10-CM | POA: Diagnosis not present

## 2021-10-18 DIAGNOSIS — I10 Essential (primary) hypertension: Secondary | ICD-10-CM | POA: Diagnosis not present

## 2021-10-18 DIAGNOSIS — E1169 Type 2 diabetes mellitus with other specified complication: Secondary | ICD-10-CM | POA: Diagnosis not present

## 2021-10-18 DIAGNOSIS — N183 Chronic kidney disease, stage 3 unspecified: Secondary | ICD-10-CM | POA: Diagnosis not present

## 2021-11-01 ENCOUNTER — Telehealth: Payer: Medicare Other | Admitting: Pharmacist

## 2021-11-02 ENCOUNTER — Ambulatory Visit (INDEPENDENT_AMBULATORY_CARE_PROVIDER_SITE_OTHER): Payer: Medicare Other | Admitting: Adult Health

## 2021-11-02 ENCOUNTER — Encounter: Payer: Self-pay | Admitting: Adult Health

## 2021-11-02 ENCOUNTER — Other Ambulatory Visit: Payer: Self-pay

## 2021-11-02 VITALS — BP 125/69 | HR 80 | Ht 70.0 in | Wt 226.0 lb

## 2021-11-02 DIAGNOSIS — G40909 Epilepsy, unspecified, not intractable, without status epilepticus: Secondary | ICD-10-CM

## 2021-11-02 DIAGNOSIS — Z9989 Dependence on other enabling machines and devices: Secondary | ICD-10-CM | POA: Diagnosis not present

## 2021-11-02 DIAGNOSIS — G4731 Primary central sleep apnea: Secondary | ICD-10-CM | POA: Diagnosis not present

## 2021-11-02 MED ORDER — LEVETIRACETAM 500 MG PO TABS: 500.0000 mg | ORAL_TABLET | Freq: Two times a day (BID) | ORAL | 3 refills | Status: DC

## 2021-11-02 NOTE — Progress Notes (Signed)
?[facility] ?Jeffery Reed ?[address] [place] [address] ?[phone] ? ?     OFFICE FOLLOW UP NOTE ? ?Jeffery Reed ?Date of Birth:  05/18/1946 ?Medical Record Number:  [id number]  ? ?Referring MD: Jeffery Reed ? ?Reason for Referral: Seizure and CPAP ? ?Chief Complaint  ?Patient presents with  ? Obstructive Sleep Apnea  ?  RM 3 alone ?Pt is well, no concerns with CPAP   ? ?  ? ? ?HPI:  ? ?Update 11/02/2021 JM: Patient returns for initial CPAP compliance visit unaccompanied.  Previously seen 4 months ago for seizure follow-up and has been stable from that aspect since prior visit.  Compliant on Keppra without side effects and no seizure activity.  Initiated CPAP on 08/31/2021 after completion of HST which showed severe REM sleep dominant sleep apnea, pursued titration study which confirmed complex sleep apnea by Dr. Brett Reed.  Review of compliance report shows 27 out of 30 usage days at 90% and 13 days greater than 4 hours with average usage 3 hours and 47 minutes.  Residual AHI 0.6.  Leaks in the 95 percentile 4.4.  Pressure in the 95th percentile 9.0.  On AutoPap 6-13 with EPR 2. ? ?Reports overall tolerating CPAP well with use of nasal pillows. He does report restless sleep, will have nocturia and at times will have to leave his bed to sleep in recliner. He typically does not sleep more than 4 hours per night.   ? ?Epworth Sleepiness Scale 4/24 ?Fatigue severity scale 33/63 ? ? ? ? ? ?History provided for reference purposes only ?Update 07/06/2021 JM: Returns for 59-monthseizure follow-up unaccompanied.  Overall stable since prior visit without any reoccurring seizure activity.  Remains on Keppra 500 mg twice daily tolerating without side effects.  Continues to live with his wife -able to maintain ADLs and IADLs independently.  He drives his personal car without difficulty.  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  No further concerns at this  time. ? ?Update 12/29/2020 JM: [name] for seizure follow-up after initial consult visit with Dr. SLeonie Man2 months ago.  He is unaccompanied at today's visit.  EEG 12/13/2020 did show mild irritability which increases predisposition of seizures and recommended continuation of Keppra.  He has remained on Keppra tolerating without side effects.  Denies any additional seizure activity.  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  He does report prior history of sleep apnea over 10 years ago previously using CPAP but he stopped using after weight loss.  He denies any witnessed apnea but does report snoring, occasional fatigue and nocturia.  He questions possible need of repeat sleep study. ? ? ?Consult visit 10/25/2020 Dr. SLeonie Reed [name] a pleasant 76year old Caucasian male seen today for initial office consultation visit for seizure.  History is obtained from the patient and review of electronic medical records and I personally reviewed pertinent imaging films in PACS.  He has past medical history of hypertension, hyperlipidemia, prediabetes, obstructive sleep apnea on CPAP, gout, anxiety who was recently diagnosed with large 7 x 2.9 x 3.1 cm right frontal convexity meningioma in May 2021.  At that time he presented with left leg pain and had a fall.  Routine brain imaging showed a large right frontal meningioma.  He underwent gross total excision of the meningioma on 12/29/2019 by Dr. EEllene Routeand did well postoperatively.  He was on Decadron which was tapered he was also given Keppra 500 mg twice daily which he took for 6 months and seizure prophylaxis and overall tolerated very well though his wife had noticed mild personality change.  He did discontinue the Keppra.  He presented on 10/15/2020 with episode of involuntary movements on his jaw and difficulty controlling it after eating food.  This lasted only a couple of  minutes.  Wife noticed that he was rhythmically chewing and could not stop it and was choking.  He had trouble speaking and try to speak but words did not come out.  He did not have any alteration of consciousness or any tongue bite.  There was no extremity tonic-clonic movements.  Patient had no obvious trigger for this episode.  He does have some habitual nocturia and wakes up every 2 3 hours but the patient states he does sleep 6 to 8 hours every night.  He has no prior history of head injury, seizures, stroke, TIA, headaches or any other neurological problems.  There is no family history of epilepsy.  Patient has since been restarted on Keppra 500 mg twice daily which is tolerating well without any side effects.  He had an EEG done on 10/15/2020 which was also normal.  MRI scan of the brain done on 10/15/2020 showed no residual meningioma tumor with postoperative changes of craniotomy in the right frontal region.  No acute abnormality.  Overall no significant change compared to previous MRI from September 2021.  Patient denies drinking alcohol ,smoking marijuana or using any drugs of abuse. ? ?ROS:   ?14 system review of systems is positive for those listed in HPI and all other systems negative ? ?PMH:  ?Past Medical History:  ?Diagnosis Date  ? Allergy   ? Anxiety   ? Cataract   ? beginning stage 01/22/2019  ? Chronic kidney disease   ? enlarge bladder  ? Diabetes mellitus without complication ([place])   ? GERD (gastroesophageal reflux disease)   ? Gout   ? Hyperlipidemia   ? Hypertension   ? OSA (obstructive sleep apnea)   ? Pre-diabetes   ? Seizures ([place])   ? Sleep apnea   ? c pap  ? ? ?Social History:  ?Social History  ? ?Socioeconomic History  ? Marital status: Married  ?  Spouse name: Not on file  ? Number of children: Not on file  ? Years of education: Not on file  ? Highest education level: Not on file  ?Occupational History  ? Occupation: Recruitment consultant  ?  Comment: on STD 10/25/20  ?Tobacco Use  ? Smoking status:  Never  ? Smokeless tobacco: Never  ?Vaping Use  ? Vaping Use: Never used  ?Substance and Sexual Activity  ? Alcohol use: No  ? Drug use: No  ? Sexual activity: Not on file  ?Other Topics Concern  ? Not on file  ?Social History Narrative  ? Lives with wife  ? Right handed  ? Drinks 3-4 cups caffeine daily  ? ?Social Determinants of Health  ? ?Financial Resource Strain: Not on file  ?Food Insecurity: Not on file  ?Transportation Needs: Not on file  ?Physical Activity: Not on file  ?Stress: Not on file  ?Social Connections: Not on file  ?Intimate Partner Violence: Not on file  ? ? ?Medications:   ?Current Outpatient Medications on File Prior to Visit  ?Medication Sig Dispense Refill  ? acetaminophen (TYLENOL) 650 MG CR tablet Take 650 mg by mouth  every 8 (eight) hours as needed for pain (or headaches).    ? Cholecalciferol (VITAMIN D3) 10000 units capsule Take 10,000 Units by mouth at bedtime. Taking 5,000 IU QOD, alternating with 10,000    ? citalopram (CELEXA) 20 MG tablet TAKE 1 TABLET BY MOUTH DAILY FOR MOOD 90 tablet 3  ? Dulaglutide (TRULICITY) 3 VV/7.4MO SOPN Inject 3 mg as directed once a week. 6 mL 0  ? fenofibrate 160 MG tablet TAKE 1 TABLET BY MOUTH  DAILY FOR TRIGLYCERIDES  (BLOOD FATS) 90 tablet 3  ? fexofenadine (ALLEGRA) 180 MG tablet Take 180 mg by mouth daily as needed for allergies or rhinitis.    ? fluticasone (FLONASE) 50 MCG/ACT nasal spray Place 2 sprays into both nostrils daily as needed for allergies or rhinitis.    ? glipiZIDE (GLUCOTROL) 5 MG tablet TAKE 1 TO 2 TABLETS BY  MOUTH 3 TIMES DAILY WITH  MEALS FOR DIABETES 540 tablet 3  ? glucose blood (FREESTYLE LITE) test strip Check blood sugar 1 time daily-DX-E11.22 100 each 5  ? levETIRAcetam (KEPPRA) 500 MG tablet Take 1 tablet (500 mg total) by mouth 2 (two) times daily. 180 tablet 3  ? metFORMIN (GLUCOPHAGE) 500 MG tablet Take 1 tablet (500 mg total) by mouth daily with supper. (Patient taking differently: Take 500 mg by mouth 3 (three)  times daily. 3 X daily) 90 tablet 3  ? mupirocin ointment (BACTROBAN) 2 % APPLY TWICE A DAY TO SKIN INFECTION. 66 g 1  ? omeprazole (PRILOSEC) 40 MG capsule TAKE 1 CAPSULE BY MOUTH  DAILY TO PREVENT  INDIGESTION &

## 2021-11-03 ENCOUNTER — Other Ambulatory Visit: Payer: Self-pay

## 2021-11-04 ENCOUNTER — Other Ambulatory Visit: Payer: Self-pay

## 2021-11-04 ENCOUNTER — Telehealth: Payer: Self-pay

## 2021-11-04 NOTE — Telephone Encounter (Signed)
11/04/21-Called patient and confirmed visit for 11/08/21 at 10:00AM for CPP visit and went over precall questions with patient. ? ?Total time spent: 7 Minutes ?Vanetta Shawl, [facility] ?

## 2021-11-07 NOTE — Telephone Encounter (Signed)
Pre-Call Questions ? ?Are you able to connect with Patient: ?Yes ?Confirmed appointment date/time with patient/caregiver?: ?Yes ?Date/time of the appointment: ?11/08/21 at 10:00AM [phone] ?Visit type: ?Phone ?Patient/Caregiver instructed to bring medications to appointment: ?Yes ?What, if any, problems do you have getting your medications from the pharmacy?: ?None ?What is your top health concern to discuss at your upcoming visit?: ?Pt. has no health concerns at this time. ?Have you seen any other providers since your last visit?: ?No ?

## 2021-11-08 ENCOUNTER — Ambulatory Visit: Payer: Medicare Other | Admitting: Pharmacy Technician

## 2021-11-08 ENCOUNTER — Other Ambulatory Visit: Payer: Self-pay

## 2021-11-08 DIAGNOSIS — E1122 Type 2 diabetes mellitus with diabetic chronic kidney disease: Secondary | ICD-10-CM

## 2021-11-08 DIAGNOSIS — Z79899 Other long term (current) drug therapy: Secondary | ICD-10-CM

## 2021-11-08 DIAGNOSIS — N1832 Chronic kidney disease, stage 3b: Secondary | ICD-10-CM

## 2021-11-08 DIAGNOSIS — E669 Obesity, unspecified: Secondary | ICD-10-CM

## 2021-11-18 DIAGNOSIS — E1169 Type 2 diabetes mellitus with other specified complication: Secondary | ICD-10-CM | POA: Diagnosis not present

## 2021-11-18 DIAGNOSIS — N183 Chronic kidney disease, stage 3 unspecified: Secondary | ICD-10-CM | POA: Diagnosis not present

## 2021-11-18 DIAGNOSIS — I10 Essential (primary) hypertension: Secondary | ICD-10-CM | POA: Diagnosis not present

## 2021-11-29 NOTE — Progress Notes (Signed)
Pharmacist Visit  ?FAWAZ, BORQUEZ [id number] ?76 years, Male  DOB: July 08, 1946  M: [phone] ?Care Team: Ander Gaster, Debbra Riding __________________________________________________ ?Chronic Conditions ?Patient's Chronic Conditions: Hypertension (HTN), Gastroesophageal Reflux Disease (GERD), Hyperlipidemia/Dyslipidemia (HLD), Diabetes (DM), Chronic Kidney Disease (CKD), Benign Prostatic Hyperplasia (BPH), Gout, Anxiety, Other ?List Other Conditions (separated by comma): Vitamin D deficiency, OSA, Retrognathia, Testosterone deficiency, Obesity, Seizures  ? ?Summary for PCP: Due to patient preference will transition to [facility] program. ?1. Recommend updated lipid panel to monitor HDL and TG. ?2. Recommend A1c as patient has not restarted Trulicity '3mg'$  daily. No side effects. Goal <6.5%. No lows of spikes when checking home BG. Unable to obtain log. ?3. Monitor GFR and dose adjust medications as needed, especially Metformin and fenofibrate. ?4. Patient needs Colonoscopy this year. Wants to discuss at [facility]. Also needs eye exam documented or by claim. ? ?Disease Assessments ?Current BP: 128/82 ?Current HR: 62 ?taken on: 10/11/2021 ?Weight: 227 ?BMI: 32.57 ?Last GFR: 39 ?taken on: 09/14/2021 ?Visit Completed on: 11/08/2021 ?Why did the patient present?: CCM Follow up ?Lifestyle habits such as diet and exercise?: None ?Factors that may affect medication adherence?: Pill burden ?Any additional demeanor/mood notes?: Was not able to fully complete visit as patient was not interested in talking for more than 15 minutes. Will transition to CCS and monitor patient without calls due to lack of interest. ?Hypertension (HTN) ?Assess this condition today?: No ?Hyperlipidemia/Dyslipidemia (HLD) ?Last Lipid panel on: 09/14/2021 ?TC (Goal<200): 156 ?LDL: 96 ?HDL (Goal>40): 29 ?TG (Goal<150): 217 ?ASCVD 10-year risk?is:: High (>20%) ?ASCVD Risk Score: 48.9% ?Assess this condition today?: Yes ?LDL Goal: <70 ?Has patient tried and  failed any HLD Medications?: No ?Check present secondary causes (below) that can lead to increased cholesterol levels (multi-choice optional): Diabetes, CKD ?We discussed: How to reduce cholesterol through diet/weight management and physical activity., How a diet high in plant sterols (fruits/vegetables/nuts/whole grains/legumes) may reduce your cholesterol. ?Assessment:: Uncontrolled ?Drug: Crestor '10mg'$  QD ?Assessment: Appropriate, Query Effectiveness ?Drug: Fenofibrate '160mg'$  QD ?Assessment: Appropriate, Query Effectiveness ?Plan to Order: Rec follow up Lipid panel ?Plan to Counsel: Counseled to decrease processed meats, sugars, and fats ?Plan to (other): Increase dietary fibers, fruits, and vegetables ?Pharmacist Follow up: Transition to CCS ?Diabetes (DM) ?Current A1C: 7.1 ?taken on: 09/14/2021 ?Type: 2 ?The current microalbumin ratio is: 3 ?tested on: 03/03/2021 ?Results: Unknown ?Last DM Eye Exam on: 12/30/2020 ?Results: Retinopathy ?Results: Unknown ?Assess this condition today?: Yes ?Goal A1C: < 6.5 % ?Type: 2 ?Is Patient taking statin medication: Yes ?Is patient taking ACEi / ARB?: Yes ?Patient checking Blood Sugar at home?: Yes ?Does patient use a Continuous Glucose Monitoring (CGM) device?: No ?How often does patient check Blood Sugars?: Once every other day ?Has patient experienced any hypoglycemic episodes?: No ?Diet recall discussed: No ?We discussed: How to recognize and treat signs of hypoglycemia., Low carbohydrate eating plan with an emphasis on whole grains, legumes, nuts, fruits, and vegetables and minimal refined and processed foods. ?Assessment:: Uncontrolled ?Drug: Trulicity '3mg'$  weekly ?Assessment: Appropriate, Effective, Safe, Accessible ?Drug: Glipizide '5mg'$  BID ?Assessment: Appropriate, Effective, Safe, Accessible ?Drug: Metformin '500mg'$  BID ?Assessment: Appropriate, Effective, Safe, Accessible ?Additional Info: Has restarted Trulicity. Has been able to access medication. Copay is high at $120  each month. Patient states no readings >150 or any lows since restarting Trulicity. Also only taking Glipizide and Metformin BID instead of TID. Will need to monitor GRF and metformin. ?Plan to Order: Rec A1c at next visit ?Plan to (other): Monitor GFR  ?Pharmacist  Follow up: Transition to CCS ?Chronic kidney disease (CKD) ?Previous GFR: 38 ?taken on: 06/15/2021 ?The current microalbumin ratio is: 3 ?tested on: 03/03/2021 ?Assess this condition today?: Yes ?CKD Stage: Stage 3b (GFR 30-44 mL/min) ?Contributing factors for developing CKD: Diabetes, HTN ?Is Patient taking statin medication: Yes ?Is patient taking ACEi / ARB?: Yes ?Renal dose adjustments recommended?: No ?We discussed: Limiting dietary sodium intake to less than 2000 mg / day, Maintaining blood pressure control, Maintaining blood glucose control, Avoidance of nephrotoxic drugs (NSAIDs) ?Assessment:: Controlled ?Drug: Valsartan '160mg'$  QD for BP control but also kidney protection ?Assessment: Appropriate, Effective, Safe, Accessible ?Exercise, Diet and Non-Drug Coordination Needs ?Discussed Non-Drug Care Coordination Needs: Yes ?Does Patient have Medication financial barriers?: No ?[facility]able [facility] Health-Related Social Needs Screening Tool -  ?SDOH  ?([email or web address]) ?Engagement Notes ?Marda Stalker on 11/08/2021 10:40 AM ?CPP Review: 50mn ?CPP OV: 132m ?CPP Doc: 2081m? ?Clinical Summary ?Patient Risk: Low ?Next CCM Follow Up: Transition to CCS ?Next AWV: 12/22/21 ? ?AveMarda StalkerharmD ?Clinical Pharmacist ?AveNaida Sleightlton'@[email or web address]'$ .care ?[phone]  ?

## 2021-12-06 ENCOUNTER — Other Ambulatory Visit (HOSPITAL_COMMUNITY): Payer: Self-pay

## 2021-12-06 ENCOUNTER — Other Ambulatory Visit: Payer: Self-pay

## 2021-12-08 ENCOUNTER — Other Ambulatory Visit: Payer: Self-pay

## 2021-12-22 ENCOUNTER — Encounter: Payer: Self-pay | Admitting: Nurse Practitioner

## 2021-12-22 ENCOUNTER — Ambulatory Visit (INDEPENDENT_AMBULATORY_CARE_PROVIDER_SITE_OTHER): Payer: Medicare Other | Admitting: Nurse Practitioner

## 2021-12-22 VITALS — BP 118/62 | HR 63 | Temp 97.3°F | Wt 227.0 lb

## 2021-12-22 DIAGNOSIS — Z9989 Dependence on other enabling machines and devices: Secondary | ICD-10-CM

## 2021-12-22 DIAGNOSIS — E785 Hyperlipidemia, unspecified: Secondary | ICD-10-CM

## 2021-12-22 DIAGNOSIS — Z79899 Other long term (current) drug therapy: Secondary | ICD-10-CM

## 2021-12-22 DIAGNOSIS — N1832 Chronic kidney disease, stage 3b: Secondary | ICD-10-CM | POA: Diagnosis not present

## 2021-12-22 DIAGNOSIS — E1122 Type 2 diabetes mellitus with diabetic chronic kidney disease: Secondary | ICD-10-CM

## 2021-12-22 DIAGNOSIS — D320 Benign neoplasm of cerebral meninges: Secondary | ICD-10-CM | POA: Diagnosis not present

## 2021-12-22 DIAGNOSIS — G4733 Obstructive sleep apnea (adult) (pediatric): Secondary | ICD-10-CM | POA: Diagnosis not present

## 2021-12-22 DIAGNOSIS — R7989 Other specified abnormal findings of blood chemistry: Secondary | ICD-10-CM

## 2021-12-22 DIAGNOSIS — E1169 Type 2 diabetes mellitus with other specified complication: Secondary | ICD-10-CM | POA: Diagnosis not present

## 2021-12-22 DIAGNOSIS — K219 Gastro-esophageal reflux disease without esophagitis: Secondary | ICD-10-CM | POA: Diagnosis not present

## 2021-12-22 DIAGNOSIS — I10 Essential (primary) hypertension: Secondary | ICD-10-CM | POA: Diagnosis not present

## 2021-12-22 DIAGNOSIS — N2889 Other specified disorders of kidney and ureter: Secondary | ICD-10-CM

## 2021-12-22 DIAGNOSIS — R569 Unspecified convulsions: Secondary | ICD-10-CM | POA: Diagnosis not present

## 2021-12-22 DIAGNOSIS — E559 Vitamin D deficiency, unspecified: Secondary | ICD-10-CM | POA: Diagnosis not present

## 2021-12-22 DIAGNOSIS — E669 Obesity, unspecified: Secondary | ICD-10-CM

## 2021-12-22 NOTE — Progress Notes (Signed)
FOLLOW UP ?Assessment:  ? ?Essential hypertension ?Controlled ?Continue medication. ?Continue lifestyle modifications ? ?OSA (obstructive sleep apnea) ?Discussed good sleep hygiene ?Discussed WL ?Continue to follow with Dr. Brett Fairy ? ?Type II diabetes mellitus with stage 3b CKD ?Continue medications ?Continue lifestyle modifications ? ?- Hemoglobin A1c ? ?BPH/Prostatism ?No changes from prior visit. ?Urology following. ? ? Hyperlipidemia ?Continue medications ?Lifestyle modifications ? ?- Lipid panel ?- CMP ? ?Idiopathic gout, unspecified chronicity, unspecified site ?Gout- recheck Uric acid as needed ?Low purine diet discussed ? ?Vitamin D deficiency ?Not at goal. ?Discussed supplement ? ? Medication management ? ?- CBC with Differential/Platelet ?- CMP/GFR ? ?Seizures ?Controlled  ?No recent seizure activity,  ?Continue to monitor ?Neurology following. ? ?Obesity - BMI 30-34 ?Continue weight loss, diet, and exercise, lifestyle modifications ?Recommended diet heavy in fruits and veggies and low in animal meats, cheeses, and dairy products, appropriate calorie intake ? ?Abnormal Thyroid Blood Test ?Elevated last check ?Continue to monitor ? ?-TSH ? ?Anxiety ?Continue medications.  ?Stress management techniques discussed, increase water, good sleep hygiene discussed, increase exercise, and increase veggies.  ? ?Cerebral Meningioma ?Neurology following. ?Continue to monitor.  ? ? ?Over 30 minutes of exam, counseling, chart review, and critical decision making was performed ?Future Appointments  ?Date Time Provider [place]  ?12/22/2021 10:30 AM Darrol Jump, NP [facility] None  ?01/23/2022  1:40 PM Philemon Kingdom, MD [facility] None  ?03/08/2022  2:00 PM Unk Pinto, MD [facility] None  ?06/15/2022 11:00 AM Magda Bernheim, NP [facility] None  ?11/07/2022  2:15 PM Frann Rider, NP [facility] None  ? ? ?Plan:  ? ?During the course of the visit the patient was educated and counseled about appropriate  screening and preventive services including:  ? ?Pneumococcal vaccine  ?Influenza vaccine ?Prevnar 13 ?Td vaccine ?Screening electrocardiogram ?Colorectal cancer screening ?Diabetes screening ?Glaucoma screening ?Nutrition counseling  ? ? ?Subjective:  ?Jeffery Reed is a 76 y.o. male who presents for  3 month follow up for HTN, hyperlipidemia, T2 diabetes, and vitamin D Def.  ? ?He has a new CPAP machine.  He is sleeping better with less snoring.  ? ?He has a diagnosis of anxiety and is currently taking Celexa 20 mg QD. Symptoms are well controlled with medication  ? ?He has BPH/prostatism followed by urology. No new medications at this time.  He continues to have nocturia 2-3 times nightly.  ? 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  ? ?BMI is Body mass index is 32.57 kg/m?., he admits exercise has been limited,, he is trying to monitor intake of fatty, surgery foods.  Weight is staying the same.   ?Wt Readings from Last 3 Encounters:  ?12/22/21 227 lb (103 kg)  ?11/02/21 226 lb (102.5 kg)  ?10/11/21 227 lb (103 kg)  ? ?His blood pressure has been controlled at home, today their BP is BP: 118/62 ?BP Readings from Last 3 Encounters:  ?12/22/21 118/62  ?11/02/21 125/69  ?10/11/21 128/82  ?  ?He does not workout. He denies chest pain, shortness of breath, dizziness.  ? ?He is on cholesterol medication (rosuvastatin 10 mg daily) and denies myalgias. His cholesterol is not at goal. The cholesterol last visit was:   ?Lab Results  ?Component Value Date  ? CHOL 156 09/14/2021  ? HDL 29 (L) 09/14/2021  ? [place] 96 09/14/2021  ? TRIG 217 (H) 09/14/2021  ? CHOLHDL 5.4 (H) 09/14/2021  ? ?He has been working on diet and exercise for diabetes  with CKD, and denies foot ulcerations, hyperglycemia, hypoglycemia , increased appetite, nausea, paresthesia of the feet, polydipsia, polyuria, visual disturbances, vomiting and weight loss. He checks random fasting glucoses; has been running 120's  currently on metformin 500 mg with dinner and glipizide 5 mg 2 tabs twice daily and Trulicity 3 mg weekly. Last A1C in the office was:   ?Lab Results  ?Component Value Date  ? HGBA1C 7.1 (H) 09/14/2021  ? ?Last GFR ?Lab Results  ?Component Value Date  ? GFRNONAA 37 (L) 10/25/2020  ? ? Patient is on Vitamin D supplement.  Not currently at goal.  ?Lab Results  ?Component Value Date  ? VD25OH 38 09/14/2021  ?   ? ? ?Medication Review: ?Current Outpatient Medications on File Prior to Visit  ?Medication Sig Dispense Refill  ? acetaminophen (TYLENOL) 650 MG CR tablet Take 650 mg by mouth every 8 (eight) hours as needed for pain (or headaches).    ? Cholecalciferol (VITAMIN D3) 10000 units capsule Take 10,000 Units by mouth at bedtime. Taking 5,000 IU QOD, alternating with 10,000    ? citalopram (CELEXA) 20 MG tablet TAKE 1 TABLET BY MOUTH DAILY FOR MOOD 90 tablet 3  ? Dulaglutide (TRULICITY) 3 WG/9.5AO SOPN Inject 3 mg as directed once a week. 6 mL 0  ? fenofibrate 160 MG tablet TAKE 1 TABLET BY MOUTH  DAILY FOR TRIGLYCERIDES  (BLOOD FATS) 90 tablet 3  ? fexofenadine (ALLEGRA) 180 MG tablet Take 180 mg by mouth daily as needed for allergies or rhinitis.    ? fluticasone (FLONASE) 50 MCG/ACT nasal spray Place 2 sprays into both nostrils daily as needed for allergies or rhinitis.    ? glipiZIDE (GLUCOTROL) 5 MG tablet TAKE 1 TO 2 TABLETS BY  MOUTH 3 TIMES DAILY WITH  MEALS FOR DIABETES 540 tablet 3  ? levETIRAcetam (KEPPRA) 500 MG tablet Take 1 tablet (500 mg total) by mouth 2 (two) times daily. 180 tablet 3  ? metFORMIN (GLUCOPHAGE) 500 MG tablet Take 1 tablet (500 mg total) by mouth daily with supper. (Patient taking differently: Take 500 mg by mouth 3 (three) times daily. 3 X daily) 90 tablet 3  ? mupirocin ointment (BACTROBAN) 2 % APPLY TWICE A DAY TO SKIN INFECTION. 66 g 1  ? omeprazole (PRILOSEC) 40 MG capsule TAKE 1 CAPSULE BY MOUTH  DAILY TO PREVENT  INDIGESTION & HEARTBURN 90 capsule 3  ? rosuvastatin (CRESTOR) 10  MG tablet TAKE 1 TABLET BY MOUTH  DAILY FOR CHOLESTEROL 90 tablet 3  ? valsartan (DIOVAN) 160 MG tablet Take  1 tablet  Daily  for BP & Diabetic Kidney Protection  / Patient knows to take by mouth  ! 90 tablet 1  ? glucose blood (FREESTYLE LITE) test strip Check blood sugar 1 time daily-[id number] 100 each 5  ? pyridOXINE (B-6) 50 MG tablet Take 1 tablet (50 mg total) by mouth daily. 30 tablet 0  ? ?No current facility-administered medications on file prior to visit.  ? ? ?Current Problems (verified) ?Patient Active Problem List  ? Diagnosis Date Noted  ? At risk for central sleep apnea 06/10/2021  ? Loud snoring 03/08/2021  ? History of sleep apnea 03/08/2021  ? Hx of resection of meningioma 03/08/2021  ? Retrognathia 03/08/2021  ? Seizure-like activity ([place]) 10/15/2020  ? Acute-on-chronic kidney injury ([place]) 10/15/2020  ? Seizures ([place]) 10/14/2020  ? Renal mass 01/08/2020  ? Noncompliance with CPAP treatment   ? Stage 3 chronic kidney disease ([place])   ?  Steroid-induced hyperglycemia   ? Enlarged prostate   ? CKD stage 3 due to type 2 diabetes mellitus ([place]) 03/12/2019  ? Anxiety 02/18/2018  ? GERD (gastroesophageal reflux disease) 03/12/2016  ? Obesity (BMI 30.0-34.9) 06/01/2015  ? Medication management 08/19/2014  ? BPH/Prostatism 08/19/2014  ? Essential hypertension 08/17/2013  ? Type 2 diabetes mellitus with stage 3 chronic kidney disease, without long-term current use of insulin ([place]) 08/17/2013  ? Vitamin D deficiency 08/17/2013  ? Hyperlipidemia associated with type 2 diabetes mellitus ([place])   ? Testosterone deficiency   ? Gout   ? OSA (obstructive sleep apnea)   ? ? ?Screening Tests ?Immunization History  ?Administered Date(s) Administered  ? DT (Pediatric) 06/01/2015  ? Fluad Quad(high Dose 65+) 05/20/2020  ? Influenza Whole 06/05/2013  ? Influenza, High Dose Seasonal PF 06/05/2014, 06/01/2015, 06/14/2016, 06/22/2017, 06/11/2018, 05/13/2019, 05/17/2021  ? PFIZER(Purple Top)SARS-COV-2 Vaccination 09/27/2019,  10/20/2019  ? Pneumococcal Conjugate-13 08/19/2014  ? Pneumococcal Polysaccharide-23 08/22/2003, 12/10/2015  ? Tdap 03/08/2004  ? Zoster, Live 08/18/2013  ? ? ?Names of Other Physician/Practitioners you cur

## 2021-12-23 LAB — CBC WITH DIFFERENTIAL/PLATELET
Absolute Monocytes: 600 cells/uL (ref 200–950)
Basophils Absolute: 47 cells/uL (ref 0–200)
Basophils Relative: 0.6 %
Eosinophils Absolute: 245 cells/uL (ref 15–500)
Eosinophils Relative: 3.1 %
HCT: 41.4 % (ref 38.5–50.0)
Hemoglobin: 14 g/dL (ref 13.2–17.1)
Lymphs Abs: 1967 cells/uL (ref 850–3900)
MCH: 29.7 pg (ref 27.0–33.0)
MCHC: 33.8 g/dL (ref 32.0–36.0)
MCV: 87.7 fL (ref 80.0–100.0)
MPV: 10.9 fL (ref 7.5–12.5)
Monocytes Relative: 7.6 %
Neutro Abs: 5040 cells/uL (ref 1500–7800)
Neutrophils Relative %: 63.8 %
Platelets: 178 10*3/uL (ref 140–400)
RBC: 4.72 10*6/uL (ref 4.20–5.80)
RDW: 13.2 % (ref 11.0–15.0)
Total Lymphocyte: 24.9 %
WBC: 7.9 10*3/uL (ref 3.8–10.8)

## 2021-12-23 LAB — COMPLETE METABOLIC PANEL WITHOUT GFR
AG Ratio: 1.9 (calc) (ref 1.0–2.5)
ALT: 17 U/L (ref 9–46)
AST: 24 U/L (ref 10–35)
Albumin: 4.5 g/dL (ref 3.6–5.1)
Alkaline phosphatase (APISO): 48 U/L (ref 35–144)
BUN/Creatinine Ratio: 13 (calc) (ref 6–22)
BUN: 26 mg/dL — ABNORMAL HIGH (ref 7–25)
CO2: 27 mmol/L (ref 20–32)
Calcium: 10.2 mg/dL (ref 8.6–10.3)
Chloride: 103 mmol/L (ref 98–110)
Creat: 2.04 mg/dL — ABNORMAL HIGH (ref 0.70–1.28)
Globulin: 2.4 g/dL (ref 1.9–3.7)
Glucose, Bld: 94 mg/dL (ref 65–99)
Potassium: 4.6 mmol/L (ref 3.5–5.3)
Sodium: 140 mmol/L (ref 135–146)
Total Bilirubin: 0.6 mg/dL (ref 0.2–1.2)
Total Protein: 6.9 g/dL (ref 6.1–8.1)
eGFR: 34 mL/min/{1.73_m2} — ABNORMAL LOW

## 2021-12-23 LAB — LIPID PANEL
Cholesterol: 170 mg/dL (ref <200)
HDL: 29 mg/dL — ABNORMAL LOW (ref >=40)
LDL Cholesterol (Calc): 109 mg/dL (calc) — ABNORMAL HIGH
Non-HDL Cholesterol (Calc): 141 mg/dL (calc) — ABNORMAL HIGH (ref <130)
Total CHOL/HDL Ratio: 5.9 (calc) — ABNORMAL HIGH (ref <5.0)
Triglycerides: 203 mg/dL — ABNORMAL HIGH (ref <150)

## 2021-12-23 LAB — TSH: TSH: 3.29 mIU/L (ref 0.40–4.50)

## 2021-12-23 LAB — VITAMIN D 25 HYDROXY (VIT D DEFICIENCY, FRACTURES): Vit D, 25-Hydroxy: 57 ng/mL (ref 30–100)

## 2022-01-03 ENCOUNTER — Other Ambulatory Visit: Payer: Self-pay | Admitting: Internal Medicine

## 2022-01-03 DIAGNOSIS — E1122 Type 2 diabetes mellitus with diabetic chronic kidney disease: Secondary | ICD-10-CM

## 2022-01-05 ENCOUNTER — Other Ambulatory Visit: Payer: Self-pay | Admitting: Internal Medicine

## 2022-01-05 ENCOUNTER — Other Ambulatory Visit: Payer: Self-pay

## 2022-01-05 DIAGNOSIS — E1122 Type 2 diabetes mellitus with diabetic chronic kidney disease: Secondary | ICD-10-CM

## 2022-01-05 DIAGNOSIS — I10 Essential (primary) hypertension: Secondary | ICD-10-CM

## 2022-01-05 MED ORDER — VALSARTAN 160 MG PO TABS: ORAL_TABLET | ORAL | 3 refills | Status: DC

## 2022-01-05 MED ORDER — TRULICITY 3 MG/0.5ML ~~LOC~~ SOAJ
3.0000 mg | SUBCUTANEOUS | 0 refills | Status: DC
  Filled 2022-01-05: qty 2, 28d supply, fill #0
  Filled 2022-02-07: qty 2, 28d supply, fill #1
  Filled 2022-03-06: qty 2, 28d supply, fill #2

## 2022-01-05 NOTE — Telephone Encounter (Signed)
Decrease the dose to 2x a day due to decrease kidney fxn.

## 2022-01-06 ENCOUNTER — Encounter: Payer: Self-pay | Admitting: Gastroenterology

## 2022-01-06 ENCOUNTER — Other Ambulatory Visit: Payer: Self-pay

## 2022-01-10 ENCOUNTER — Other Ambulatory Visit: Payer: Self-pay

## 2022-01-10 DIAGNOSIS — N1832 Chronic kidney disease, stage 3b: Secondary | ICD-10-CM

## 2022-01-10 MED ORDER — METFORMIN HCL 500 MG PO TABS: 500.0000 mg | ORAL_TABLET | Freq: Three times a day (TID) | ORAL | 1 refills | Status: DC

## 2022-01-23 ENCOUNTER — Ambulatory Visit (INDEPENDENT_AMBULATORY_CARE_PROVIDER_SITE_OTHER): Payer: Medicare Other | Admitting: Internal Medicine

## 2022-01-23 ENCOUNTER — Encounter: Payer: Self-pay | Admitting: Internal Medicine

## 2022-01-23 VITALS — BP 128/78 | HR 66 | Ht 70.0 in | Wt 223.0 lb

## 2022-01-23 DIAGNOSIS — E1122 Type 2 diabetes mellitus with diabetic chronic kidney disease: Secondary | ICD-10-CM | POA: Diagnosis not present

## 2022-01-23 DIAGNOSIS — N1832 Chronic kidney disease, stage 3b: Secondary | ICD-10-CM

## 2022-01-23 DIAGNOSIS — E1169 Type 2 diabetes mellitus with other specified complication: Secondary | ICD-10-CM | POA: Diagnosis not present

## 2022-01-23 DIAGNOSIS — E785 Hyperlipidemia, unspecified: Secondary | ICD-10-CM

## 2022-01-23 LAB — POCT GLYCOSYLATED HEMOGLOBIN (HGB A1C): Hemoglobin A1C: 6.8 % — AB (ref 4.0–5.6)

## 2022-01-23 NOTE — Progress Notes (Signed)
Patient ID: Jeffery Reed, male   DOB: 02-Nov-1945, 76 y.o.   MRN: [id number]   This visit occurred during the SARS-CoV-2 public health emergency.  Safety protocols were in place, including screening questions prior to the visit, additional usage of staff PPE, and extensive cleaning of exam room while observing appropriate contact time as indicated for disinfecting solutions.   HPI: Jeffery Reed is a 76 y.o.-year-old male, initially referred by his PCP, Unk Pinto, MD, returning for follow-up for DM2, dx in 2018, insulin-dependent since 12/2019, uncontrolled, with complications (CKD stage III, diabetic retinopathy).  Last visit 4.5 months ago.  Interim history: No blurry vision, nausea, chest pain. He continues to have increased urination.  Reviewed HbA1c levels: Lab Results  Component Value Date   HGBA1C 7.1 (H) 09/14/2021   HGBA1C 6.5 (H) 06/15/2021   HGBA1C 6.0 (H) 03/03/2021   HGBA1C 6.7 (A) 01/19/2021   HGBA1C 5.7 (A) 09/20/2020   HGBA1C 5.5 05/20/2020   HGBA1C 6.3 (H) 03/02/2020   HGBA1C 8.1 (H) 12/29/2019   HGBA1C 7.5 (H) 09/18/2019   HGBA1C 9.4 (H) 06/18/2019   Pt is on a regimen of: - Metformin ER 1000 mg 2x a day, with meals >> off Metformin 09/2020 >> restarted 500 mg with dinner 01/2021 >> 500 mg 3x a day  >> 2x a day - Glipizide 05-25-09 >> 10-10 >> 05-25-09 >> 10 mg 2x a day, before meals - Trulicity 4.40 mg weekly - started 01/2021 >> 1.5 mg >> 3 weekly  -  >> stopped 04/2020   Pt checks his sugars once a day: - am: 68, 80-106 >> 97-169, 175, 198 >> 10-272 >> off Trulicity: 536 >> 64-403 - 2h after b'fast: 131, 136, 172 ... >> 100, 148, 243 >> 111-167 >> n/c >> 140-159 - before lunch: 158, 177, 199 (Christmas) >> n/c >> 144-167 >> n/c >> 185, 210 - 2h after lunch: 202 >> n/c >> 118 >> 125, 127 >> n/c  - before dinner: 103, 111 >> n/c >> 126, 301 >> 474 >> off Trulicity: 259 >> N/c - 2h after dinner: n/c >> 234 >> n/c >> 240 >> n/c >> 260 - bedtime: n/c >>  92, 230 >> n/c >> 94 >> n/c >> 140, 158, 310 - nighttime: n/c Lowest sugar was 68 >> 97 >> 82 >> 78; he has hypoglycemia awareness at 100. Highest sugar was 301 >> 240 >> 214 >> 310.  Glucometer: Freestyle  Pt's meals are: - Breakfast:  Instant oatmeal or [employer] sausage bisuit or scrambled eggs - Lunch: tomato sandwich, ham - Dinner: larger meal: potatoes; spaghetti, lasagna, chicken, veggies: green beans, corn, broccoli - Snacks:potato chips, apples Diet sodas, unsweet tea,  -+ CKD stage 3b-sees nephrology, last BUN/creatinine:  Lab Results  Component Value Date   BUN 26 (H) 12/22/2021   BUN 22 09/14/2021   CREATININE 2.04 (H) 12/22/2021   CREATININE 1.79 (H) 09/14/2021  02/2021: GFR 35 On valsartan 320.  -+ HL; last set of lipids: Lab Results  Component Value Date   CHOL 170 12/22/2021   HDL 29 (L) 12/22/2021   LDLCALC 109 (H) 12/22/2021   TRIG 203 (H) 12/22/2021   CHOLHDL 5.9 (H) 12/22/2021  On Crestor 10, fenofibrate 160.  - last eye exam was on 12/30/2020: + DR, + small cataract.   -+ Mild, occasional numbness and tingling in his feet.  Foot exam performed March 03, 2021.  Pt has no FH of DM.  He also has HTN, OSA.  Patient has a history of meningioma surgery in 2021.  He recovered well afterwards.  However, he had a seizure in 2022  >> started Keppra.  He is a retired Airline pilot.  During the winter, he drives a schoolbus.   ROS: + See HPI  I reviewed pt's medications, allergies, PMH, social hx, family hx, and changes were documented in the history of present illness. Otherwise, unchanged from my initial visit note.  Past Medical History:  Diagnosis Date   Allergy    Anxiety    Cataract    beginning stage 01/22/2019   Chronic kidney disease    enlarge bladder   Diabetes mellitus without complication (HCC)    GERD (gastroesophageal reflux disease)    Gout    Hyperlipidemia    Hypertension    OSA (obstructive sleep apnea)    Pre-diabetes    Seizures  ([place])    Sleep apnea    c pap   Past Surgical History:  Procedure Laterality Date   COLONOSCOPY     CRANIOTOMY Right 12/29/2019   Procedure: Craniotomy for Tumor;  Surgeon: Kristeen Miss, MD;  Location: [facility];  Service: Neurosurgery;  Laterality: Right;  Craniotomy for Tumor Excision   FOOT SURGERY  10/2009   MYRINGOTOMY Left 02-2005   Dr.Newman   TONSILLECTOMY  1953   Social History   Socioeconomic History   Marital status: Married    Spouse name: Not on file   Number of children: Not on file   Years of education: Not on file   Highest education level: Not on file  Occupational History   Occupation: Recruitment consultant    Comment: on STD 10/25/20  Tobacco Use   Smoking status: Never   Smokeless tobacco: Never  Vaping Use   Vaping Use: Never used  Substance and Sexual Activity   Alcohol use: No   Drug use: No   Sexual activity: Not on file  Other Topics Concern   Not on file  Social History Narrative   Lives with wife   Right handed   Drinks 3-4 cups caffeine daily   Social Determinants of Health   Financial Resource Strain: Not on file  Food Insecurity: No Food Insecurity   Worried About Charity fundraiser in the Last Year: Never true   [place] in the Last Year: Never true  Transportation Needs: No Transportation Needs   Lack of Transportation (Medical): No   Lack of Transportation (Non-Medical): No  Physical Activity: Not on file  Stress: Not on file  Social Connections: Not on file  Intimate Partner Violence: Not on file   Current Outpatient Medications on File Prior to Visit  Medication Sig Dispense Refill   acetaminophen (TYLENOL) 650 MG CR tablet Take 650 mg by mouth every 8 (eight) hours as needed for pain (or headaches).     Cholecalciferol (VITAMIN D3) 10000 units capsule Take 10,000 Units by mouth at bedtime. Taking 5,000 IU QOD, alternating with 10,000     citalopram (CELEXA) 20 MG tablet TAKE 1 TABLET BY MOUTH DAILY FOR MOOD 90 tablet 3   Dulaglutide  (TRULICITY) 3 QH/4.7ML SOPN Inject 3 mg as directed once a week. 6 mL 0   fenofibrate 160 MG tablet TAKE 1 TABLET BY MOUTH  DAILY FOR TRIGLYCERIDES  (BLOOD FATS) 90 tablet 3   fexofenadine (ALLEGRA) 180 MG tablet Take 180 mg by mouth daily as needed for allergies or rhinitis.     fluticasone (FLONASE) 50 MCG/ACT nasal spray Place  2 sprays into both nostrils daily as needed for allergies or rhinitis.     glipiZIDE (GLUCOTROL) 5 MG tablet TAKE 1 TO 2 TABLETS BY  MOUTH 3 TIMES DAILY WITH  MEALS FOR DIABETES 540 tablet 3   glucose blood (FREESTYLE LITE) test strip Check blood sugar 1 time daily-[id number] 100 each 5   levETIRAcetam (KEPPRA) 500 MG tablet Take 1 tablet (500 mg total) by mouth 2 (two) times daily. 180 tablet 3   metFORMIN (GLUCOPHAGE) 500 MG tablet Take 1 tablet (500 mg total) by mouth 3 (three) times daily. 270 tablet 1   mupirocin ointment (BACTROBAN) 2 % APPLY TWICE A DAY TO SKIN INFECTION. 66 g 1   omeprazole (PRILOSEC) 40 MG capsule TAKE 1 CAPSULE BY MOUTH  DAILY TO PREVENT  INDIGESTION & HEARTBURN 90 capsule 3   pyridOXINE (B-6) 50 MG tablet Take 1 tablet (50 mg total) by mouth daily. 30 tablet 0   rosuvastatin (CRESTOR) 10 MG tablet TAKE 1 TABLET BY MOUTH  DAILY FOR CHOLESTEROL 90 tablet 3   valsartan (DIOVAN) 160 MG tablet Take  1 tablet  Daily  for BP & Diabetic Kidney Protection  /                               /                    TAKE                    BY             MOUTH 90 tablet 3   No current facility-administered medications on file prior to visit.   Allergies  Allergen Reactions   Cardura [Doxazosin Mesylate] Other (See Comments)    Nasal congestion   Codeine Other (See Comments)    The patient passed out   Hytrin [Terazosin] Other (See Comments)    Reaction not recalled   Family History  Problem Relation Age of Onset   Hypertension Mother    GER disease Mother    Hyperlipidemia Father    Hypertension Father    Parkinson's disease Father    Hypertension  Brother    Diabetes Maternal Grandmother    Stroke Paternal Grandmother    Diabetes Paternal Grandmother    Stroke Paternal Grandfather    Colon cancer Neg Hx    Colon polyps Neg Hx    Esophageal cancer Neg Hx    Stomach cancer Neg Hx    Rectal cancer Neg Hx    PE: BP 128/78 (BP Location: Right Arm, Patient Position: Sitting, Cuff Size: Normal)   Pulse 66   Ht '5\' 10"'$  (1.778 m)   Wt 223 lb (101.2 kg)   SpO2 93%   BMI 32.00 kg/m  Wt Readings from Last 3 Encounters:  01/23/22 223 lb (101.2 kg)  12/22/21 227 lb (103 kg)  11/02/21 226 lb (102.5 kg)   Constitutional: overweight, in NAD, + shuffling gait Eyes:EOMI, no exophthalmos ENT: moist mucous membranes, no thyromegaly, no cervical lymphadenopathy Cardiovascular: RRR, No MRG Respiratory: CTA B Musculoskeletal: no deformities Skin: moist, warm, no rashes Neurological: + mild tremor with outstretched R hand  ASSESSMENT: 1. DM2, insulin-dependent, previously uncontrolled, with long-term complications - CKD stage 3 - DR  2.  Hyperlipidemia  3. Obesity class I  PLAN:  1. Patient with longstanding type 2 diabetes, previously on insulin now off after HbA1c decreased  to less than 6%.  Metformin was held in 2022 after GFR decreased to 37.  Afterwards, kidney function improved and we were able to restart metformin.  At last visit, he only brought 2 glucose values checked at home and they were high, in the 200s.  These were checked after he ran out of Trulicity.  We restarted Trulicity and increase the dose. We continued to same dose of metformin and glipizide. -At today's visit, he is checking his sugars approximately 3 times a week.  Some of the blood sugars are in the 200s later in the day and he has 1 at 310.  The fact that he is not checking frequently makes it unclear whether these are exceptions or clear trends.  I advised him to try to check his blood sugars 1-2 times a day, rotating check times.  For now, since the sugars are  lower in the morning I advised him to take a lower dose of glipizide before a smaller meal but continued the rest of the regimen, especially since his HbA1c is actually lower. - I suggested to:  Patient Instructions  Please continue: - Metformin 500 mg 2x a day - Glipizide 10 mg before b'fast and 7-68 mg dinner - Trulicity 3 mg weekly  Check sugars 1-2x a day.  Please return in 3-4 months with your sugar log.   - we checked his HbA1c: 6.8% (lower) - advised to check sugars at different times of the day - 1x a day, rotating check times - He is interested in a CGM but we discussed that as of now, most Medicare plans are not covering the CGM in the absence of insulin.  However, by the time he comes back, it is very possible that they would start covering them.  We will readdress at next visit. - advised for yearly eye exams >> he is not UTD but has an appointment coming up - return to clinic in 3-4 months  2. HL -Reviewed latest lipid panel from 08/2021: LDL above goal, HDL low, triglycerides high: Lab Results  Component Value Date   CHOL 170 12/22/2021   HDL 29 (L) 12/22/2021   LDLCALC 109 (H) 12/22/2021   TRIG 203 (H) 12/22/2021   CHOLHDL 5.9 (H) 12/22/2021  -He continues on Crestor 10 mg daily and fenofibrate 160 mg daily without side effects  3.  Obesity class I -At last visit we increased his Trulicity dose.  This will also help with weight loss -He had stable weight at last visit, previously gained 6 pounds -At this visit, he lost 4 pounds.  Philemon Kingdom, MD PhD [facility] Endocrinology

## 2022-01-23 NOTE — Patient Instructions (Addendum)
Please continue: - Metformin 500 mg 2x a day - Glipizide 10 mg before b'fast and 9-45 mg dinner - Trulicity 3 mg weekly  Check sugars 1-2x a day.  Please return in 3-4 months with your sugar log.

## 2022-01-27 ENCOUNTER — Encounter: Payer: Self-pay | Admitting: Gastroenterology

## 2022-02-07 ENCOUNTER — Other Ambulatory Visit: Payer: Self-pay

## 2022-02-08 ENCOUNTER — Other Ambulatory Visit: Payer: Self-pay

## 2022-02-24 ENCOUNTER — Other Ambulatory Visit: Payer: Self-pay | Admitting: Adult Health

## 2022-02-24 DIAGNOSIS — E782 Mixed hyperlipidemia: Secondary | ICD-10-CM

## 2022-02-24 DIAGNOSIS — K219 Gastro-esophageal reflux disease without esophagitis: Secondary | ICD-10-CM

## 2022-02-28 DIAGNOSIS — H52223 Regular astigmatism, bilateral: Secondary | ICD-10-CM | POA: Diagnosis not present

## 2022-02-28 DIAGNOSIS — H2513 Age-related nuclear cataract, bilateral: Secondary | ICD-10-CM | POA: Diagnosis not present

## 2022-02-28 DIAGNOSIS — E119 Type 2 diabetes mellitus without complications: Secondary | ICD-10-CM | POA: Diagnosis not present

## 2022-02-28 DIAGNOSIS — H524 Presbyopia: Secondary | ICD-10-CM | POA: Diagnosis not present

## 2022-02-28 DIAGNOSIS — H25013 Cortical age-related cataract, bilateral: Secondary | ICD-10-CM | POA: Diagnosis not present

## 2022-02-28 LAB — HM DIABETES EYE EXAM

## 2022-03-06 ENCOUNTER — Other Ambulatory Visit: Payer: Self-pay

## 2022-03-08 ENCOUNTER — Encounter: Payer: Medicare Other | Admitting: Internal Medicine

## 2022-03-09 DIAGNOSIS — D320 Benign neoplasm of cerebral meninges: Secondary | ICD-10-CM | POA: Insufficient documentation

## 2022-03-10 ENCOUNTER — Other Ambulatory Visit: Payer: Self-pay

## 2022-03-23 ENCOUNTER — Ambulatory Visit (AMBULATORY_SURGERY_CENTER): Payer: Self-pay | Admitting: *Deleted

## 2022-03-23 ENCOUNTER — Telehealth: Payer: Self-pay | Admitting: *Deleted

## 2022-03-23 VITALS — Ht 70.0 in | Wt 222.0 lb

## 2022-03-23 DIAGNOSIS — Z86010 Personal history of colonic polyps: Secondary | ICD-10-CM

## 2022-03-23 MED ORDER — NA SULFATE-K SULFATE-MG SULF 17.5-3.13-1.6 GM/177ML PO SOLN: 1.0000 | ORAL | 0 refills | Status: DC

## 2022-03-23 NOTE — Progress Notes (Signed)
Patient is here in-person for PV. Patient denies any allergies to eggs or soy. Patient denies any problems with anesthesia/sedation. Patient is not on any oxygen at home. Patient is not taking any diet/weight loss medications or blood thinners. Went over procedure prep instructions with the patient. Patient is aware of our care-partner policy. Patient notified to use Singlecare coupon given to pt for prescription.

## 2022-03-23 NOTE — Telephone Encounter (Signed)
Pre-visit completed via co-worker.

## 2022-03-23 NOTE — Telephone Encounter (Signed)
Pt.called concerning appointment,spoke with wife she stated "he's gone for dr. Appointment made her aware that was the reason I was calling she stated "he probably got lost maybe he will get there sometime or another,pt. Has not arrived.

## 2022-03-28 ENCOUNTER — Encounter: Payer: Self-pay | Admitting: Internal Medicine

## 2022-03-28 NOTE — Patient Instructions (Signed)

## 2022-03-28 NOTE — Progress Notes (Unsigned)
Comprehensive Evaluation & Examination  Future Appointments  Date Time Provider Department  03/29/2022                     cpe 10:00 AM Unk Pinto, MD [facility]  04/11/2022  1:30 PM Milus Banister, MD [facility]  05/05/2022  1:40 PM Philemon Kingdom, MD [facility]  07/05/2022                 wellness  10:30 AM Alycia Rossetti, NP [facility]  11/07/2022  2:15 PM Frann Rider, NP [facility]  03/30/2023                     cpe 10:00 AM Unk Pinto, MD [facility]              This very nice 76 y.o. MWM presents for a comprehensive evaluation and management of multiple medical co-morbidities.  Patient has been followed for HTN, HLD, T2_NIDDM  /CKD3a  and Vitamin D Deficiency.   Patient also is on CPAP for OSA with improved sleep hygiene managed by Dr Brett Fairy .                                                    In  2021 patient underwent craniotomy for meningoma resection by Dr Ellene Route and post-op on Decadron flared his diabetes requiring insulin & patient was referred to Dr Cruzita Lederer for management.  Since then, patient has tapered off insulin & continues on Metformin, Glipizide and Trulicity.         HTN predates circa  1992. Patient's BP has been controlled at home.  Today's BP is  at goal - 138/76 . Patient denies any cardiac symptoms as chest pain, palpitations, shortness of breath, dizziness or ankle swelling.       Patient's hyperlipidemia is not controlled with diet and Rosuvastatin /Fenofibrate . Patient denies myalgias or other medication SE's. Last lipids were not at goal :  Lab Results  Component Value Date   CHOL 170 12/22/2021   HDL 29 (L) 12/22/2021   LDLCALC 109 (H) 12/22/2021   TRIG 203 (H) 12/22/2021   CHOLHDL 5.9 (H) 12/22/2021         Patient has hx/o  PreDiabetes  (2008) transitioning to T2_NIDDM circa 2014   and patient denies reactive hypoglycemic symptoms, visual blurring, diabetic polys or paresthesias.  Patient is OFF of Insulin & is on Trulicity,  Glipizide & Metformin.  Patient admits "overeating despite Trulicity.   BMI = 32.11 - patient remains OVERWEIGHT.   Last A1c was not at goal :   Lab Results  Component Value Date   HGBA1C 6.8 (A) 01/23/2022   Wt Readings from Last 3 Encounters:  03/29/22 223 lb 12.8 oz (101.5 kg)  03/23/22 222 lb (100.7 kg)  01/23/22 223 lb (101.2 kg)         Finally, patient has history of Vitamin D Deficiency ("22" /2008) and last vitamin D was at goal :    Lab Results  Component Value Date   VD25OH 57 12/22/2021       Current Outpatient Medications on File Prior to Visit  Medication Sig   acetaminophen  650 MG CR tablet Take 650 mg every 8 hours as needed for pain   VITAMIN D3 10000 units capsule Take  at bedtime.  citalopram 20 MG tablet TAKE 1 TABLET DAILY     TRULICITY 3 EX/9.3ZJ  Inject 3.0 mg weekly under skin   fenofibrate 160 MG tablet Take 1 tablet  Daily    fexofenadine 180 MG tablet Take daily as needed for allergies    FLONASE nasal spray Place 2 sprays into  nostrils daily as needed   glipiZIDE 5 MG tablet Takes 2 tablets 2 times daily before a meal.   levETIRAcetam (KEPPRA) 500 MG  Take 1 tablet  2  times daily.   metFORMIN  500 MG tablet Take 1 tablet daily with supper.   omeprazole 40 MG capsule Take 1 capsule Daily   polyethylene glycol  Take daily as needed    pyridOXINE (B-6) 50 MG tablet Take 1 tablet daily.   rosuvastatin 10 MG tablet Take  1 tablet  Daily     valsartan 160 MG tablet Take  1 tablet  Daily      Allergies  Allergen Reactions   Cardura [Doxazosin Mesylate]     Nasal congestion   Codeine     The patient passed out     Past Medical History:  Diagnosis Date   Allergy    Anxiety    Cataract    beginning stage 01/22/2019   Chronic kidney disease    enlarge bladder   Diabetes mellitus without complication (HCC)    GERD (gastroesophageal reflux disease)    Gout    Hyperlipidemia    Hypertension    OSA (obstructive sleep apnea)     Pre-diabetes    Seizures ([place])    Sleep apnea    c pap    Health Maintenance  Topic Date Due   Zoster Vaccines- Shingrix (1 of 2) Never done   COVID-19 Vaccine (3 - Pfizer risk series) 11/17/2019   FOOT EXAM  03/02/2021   INFLUENZA VACCINE  03/21/2021   HEMOGLOBIN A1C  07/21/2021   OPHTHALMOLOGY EXAM  12/30/2021   COLONOSCOPY ( 02/02/2022   TETANUS/TDAP  05/31/2025   Hepatitis C Screening  Completed   PNA vac Low Risk Adult  Completed   HPV VACCINES  Aged Out     Immunization History  Administered Date(s) Administered   DT  06/01/2015   Fluad Quad - high Dose 65+) 05/20/2020   Influenza Whole 06/05/2013   Influenza, High Dose  06/11/2018, 05/13/2019   PFIZER  SARS-COV-2 Vacc 09/27/2019, 10/20/2019   Pneumococcal -13 08/19/2014   Pneumococcal -23 08/22/2003, 12/10/2015   Tdap 03/08/2004   Zoster, Live 08/18/2013    Last Colon - 02/03/2019 - Dr Ardis Hughs - Recc 3 year f/u due Mar 2023 overdue  - 01/06/22 & 01/27/22 - Dr Ardis Hughs sent recall letters    - Patient has Colonoscopy scheduled 8/22 with Dr Ardis Hughs.    Past Surgical History:  Procedure Laterality Date   COLONOSCOPY     CRANIOTOMY Right 12/29/2019   Procedure: Craniotomy for Tumor; Kristeen Miss, MD   FOOT SURGERY  10/2009   MYRINGOTOMY Left 02-2005   Dr.Newman   TONSILLECTOMY  1953    Family History  Problem Relation Age of Onset   Hypertension Mother    GER disease Mother    Hyperlipidemia Father    Hypertension Father    Parkinson's disease Father    Hypertension Brother    Diabetes Maternal Grandmother    Stroke Paternal Grandmother    Diabetes Paternal Grandmother    Stroke Paternal Grandfather    Colon cancer Neg Hx    Colon  polyps Neg Hx    Esophageal cancer Neg Hx    Stomach cancer Neg Hx    Rectal cancer Neg Hx     Social History   Socioeconomic History   Marital status: Married    Spouse name: Peggy   Number of children: 1 adopted daughter  Occupational History   Occupation: Risk analyst    Comment: on STD 10/25/20  Tobacco Use   Smoking status: Never   Smokeless tobacco: Never  Vaping Use   Vaping Use: Never used  Substance and Sexual Activity   Alcohol use: No   Drug use: No   Sexual activity: Not on file  Social History Narrative   Lives with wife   Right handed   Drinks 3-4 cups caffeine daily    ROS Constitutional: Denies fever, chills, weight loss/gain, headaches, insomnia,  night sweats or change in appetite. Does c/o fatigue. Eyes: Denies redness, blurred vision, diplopia, discharge, itchy or watery eyes.  ENT: Denies discharge, congestion, post nasal drip, epistaxis, sore throat, earache, hearing loss, dental pain, Tinnitus, Vertigo, Sinus pain or snoring.  Cardio: Denies chest pain, palpitations, irregular heartbeat, syncope, dyspnea, diaphoresis, orthopnea, PND, claudication or edema Respiratory: denies cough, dyspnea, DOE, pleurisy, hoarseness, laryngitis or wheezing.  Gastrointestinal: Denies dysphagia, heartburn, reflux, water brash, pain, cramps, nausea, vomiting, bloating, diarrhea, constipation, hematemesis, melena, hematochezia, jaundice or hemorrhoids Genitourinary: Denies dysuria, frequency, urgency, nocturia, hesitancy, discharge, hematuria or flank pain Musculoskeletal: Denies arthralgia, myalgia, stiffness, Jt. Swelling, pain, limp or strain/sprain. Denies Falls. Skin: Denies puritis, rash, hives, warts, acne, eczema or change in skin lesion Neuro: No weakness, tremor, incoordination, spasms, paresthesia or pain Psychiatric: Denies confusion, memory loss or sensory loss. Denies Depression. Endocrine: Denies change in weight, skin, hair change, nocturia, and paresthesia, diabetic polys, visual blurring or hyper / hypo glycemic episodes.  Heme/Lymph: No excessive bleeding, bruising or enlarged lymph nodes.   Physical Exam  BP (!) 148/78   Pulse 68   Temp 97.9 F (36.6 C)   Resp 16   Ht '5\' 10"'$  (1.778 m)   Wt 223 lb 12.8 oz (101.5 kg)    SpO2 97%   BMI 32.11 kg/m   General Appearance:  Over nourished  and in no apparent distress.  Eyes: PERRLA, EOMs, conjunctiva no swelling or erythema, normal fundi and vessels. Sinuses: No frontal/maxillary tenderness ENT/Mouth: EACs patent / TMs  nl. Nares clear without erythema, swelling, mucoid exudates. Oral hygiene is good. No erythema, swelling, or exudate. Tongue normal, non-obstructing. Tonsils not swollen or erythematous. Hearing normal.  Neck: Supple, thyroid not palpable. No bruits, nodes or JVD. Respiratory: Respiratory effort normal.  BS equal and clear bilateral without rales, rhonci, wheezing or stridor. Cardio: Heart sounds are normal with regular rate and rhythm and no murmurs, rubs or gallops. Peripheral pulses are normal and equal bilaterally without edema. No aortic or femoral bruits. Chest: symmetric with normal excursions and percussion.  Abdomen: Soft, with Nl bowel sounds. Nontender, no guarding, rebound, hernias, masses, or organomegaly.  Lymphatics: Non tender without lymphadenopathy.  Musculoskeletal: Full ROM all peripheral extremities, joint stability, 5/5 strength, and normal gait. Skin: Warm and dry without rashes, lesions, cyanosis, clubbing or  ecchymosis.  Neuro: Cranial nerves intact, reflexes equal bilaterally. Normal muscle tone, no cerebellar symptoms. Sensation intact.  Pysch: Alert and oriented X 3 with normal affect, insight and judgment appropriate.   Assessment and Plan  1. Essential hypertension  - EKG 12-Lead - Urinalysis, Routine w reflex microscopic - Microalbumin / creatinine urine  ratio - CBC with Differential/Platelet - COMPLETE METABOLIC PANEL WITH GFR - Magnesium - TSH  2. Hyperlipidemia associated with type 2 diabetes mellitus ([place])  - EKG 12-Lead - [place], RETROPERITNL ABD,  LTD - Lipid panel - TSH  3. Type 2 diabetes mellitus with stage 3a chronic kidney  disease, without long-term current use of insulin (HCC)  - EKG  12-Lead - [place], RETROPERITNL ABD,  LTD - HM DIABETES FOOT EXAM - LOW EXTREMITY NEUR EXAM DOCUM - Hemoglobin A1c - Insulin, random  4. Vitamin D deficiency  - VITAMIN D 25 Hydroxy  5. Seizures (HCC)  - Levetiracetam level  6. Prostate cancer screening   7. Screening for colorectal cancer  - POC Hemoccult Bld/Stl   8. Screening for ischemic heart disease  - EKG 12-Lead  9. FHx: heart disease  - EKG 12-Lead - [place], RETROPERITNL ABD,  LTD  10. Screening for AAA (aortic abdominal aneurysm)  - [place], RETROPERITNL ABD,  LTD  11. OSA on CPAP   12. Medication management  - Urinalysis, Routine w reflex microscopic - Microalbumin / creatinine urine ratio - CBC with Differential/Platelet - COMPLETE METABOLIC PANEL WITH GFR - Magnesium - Lipid panel - TSH - Hemoglobin A1c - Insulin, random - VITAMIN D 25 Hydroxy          Patient was counseled in prudent diet, weight control to achieve/maintain BMI less than 25, BP monitoring, regular exercise and medications as discussed.  Discussed med effects and SE's. Routine screening labs and tests as requested with regular follow-up as recommended. Over 40 minutes of exam, counseling, chart review and high complex critical decision making was performed   Kirtland Bouchard, MD

## 2022-03-29 ENCOUNTER — Encounter: Payer: Self-pay | Admitting: Internal Medicine

## 2022-03-29 ENCOUNTER — Telehealth: Payer: Self-pay | Admitting: *Deleted

## 2022-03-29 ENCOUNTER — Ambulatory Visit (INDEPENDENT_AMBULATORY_CARE_PROVIDER_SITE_OTHER): Payer: Medicare Other | Admitting: Internal Medicine

## 2022-03-29 VITALS — BP 138/76 | HR 68 | Temp 97.9°F | Resp 16 | Ht 70.0 in | Wt 223.8 lb

## 2022-03-29 DIAGNOSIS — E559 Vitamin D deficiency, unspecified: Secondary | ICD-10-CM

## 2022-03-29 DIAGNOSIS — E1169 Type 2 diabetes mellitus with other specified complication: Secondary | ICD-10-CM | POA: Diagnosis not present

## 2022-03-29 DIAGNOSIS — F419 Anxiety disorder, unspecified: Secondary | ICD-10-CM

## 2022-03-29 DIAGNOSIS — E1122 Type 2 diabetes mellitus with diabetic chronic kidney disease: Secondary | ICD-10-CM

## 2022-03-29 DIAGNOSIS — N401 Enlarged prostate with lower urinary tract symptoms: Secondary | ICD-10-CM | POA: Diagnosis not present

## 2022-03-29 DIAGNOSIS — Z79899 Other long term (current) drug therapy: Secondary | ICD-10-CM

## 2022-03-29 DIAGNOSIS — N138 Other obstructive and reflux uropathy: Secondary | ICD-10-CM | POA: Diagnosis not present

## 2022-03-29 DIAGNOSIS — G4733 Obstructive sleep apnea (adult) (pediatric): Secondary | ICD-10-CM

## 2022-03-29 DIAGNOSIS — N1831 Chronic kidney disease, stage 3a: Secondary | ICD-10-CM | POA: Diagnosis not present

## 2022-03-29 DIAGNOSIS — I700 Atherosclerosis of aorta: Secondary | ICD-10-CM

## 2022-03-29 DIAGNOSIS — I10 Essential (primary) hypertension: Secondary | ICD-10-CM | POA: Diagnosis not present

## 2022-03-29 DIAGNOSIS — Z125 Encounter for screening for malignant neoplasm of prostate: Secondary | ICD-10-CM | POA: Diagnosis not present

## 2022-03-29 DIAGNOSIS — Z8249 Family history of ischemic heart disease and other diseases of the circulatory system: Secondary | ICD-10-CM

## 2022-03-29 DIAGNOSIS — R569 Unspecified convulsions: Secondary | ICD-10-CM

## 2022-03-29 DIAGNOSIS — E785 Hyperlipidemia, unspecified: Secondary | ICD-10-CM | POA: Diagnosis not present

## 2022-03-29 DIAGNOSIS — Z136 Encounter for screening for cardiovascular disorders: Secondary | ICD-10-CM

## 2022-03-29 DIAGNOSIS — Z1211 Encounter for screening for malignant neoplasm of colon: Secondary | ICD-10-CM

## 2022-03-29 MED ORDER — TRULICITY 4.5 MG/0.5ML ~~LOC~~ SOAJ: SUBCUTANEOUS | 3 refills | Status: DC

## 2022-03-29 MED ORDER — CITALOPRAM HYDROBROMIDE 20 MG PO TABS: ORAL_TABLET | ORAL | 3 refills | Status: DC

## 2022-03-29 NOTE — Telephone Encounter (Signed)
Spoke with pt and rescheduled with Dr. Loletha Carrow on 04-12-22 at 10:00 am.  New instructions sent via [facility]

## 2022-03-31 LAB — COMPLETE METABOLIC PANEL WITH GFR
AG Ratio: 1.8 (calc) (ref 1.0–2.5)
ALT: 17 U/L (ref 9–46)
AST: 22 U/L (ref 10–35)
Albumin: 4.5 g/dL (ref 3.6–5.1)
Alkaline phosphatase (APISO): 48 U/L (ref 35–144)
BUN/Creatinine Ratio: 15 (calc) (ref 6–22)
BUN: 29 mg/dL — ABNORMAL HIGH (ref 7–25)
CO2: 25 mmol/L (ref 20–32)
Calcium: 9.5 mg/dL (ref 8.6–10.3)
Chloride: 107 mmol/L (ref 98–110)
Creat: 1.89 mg/dL — ABNORMAL HIGH (ref 0.70–1.28)
Globulin: 2.5 g/dL (calc) (ref 1.9–3.7)
Glucose, Bld: 127 mg/dL — ABNORMAL HIGH (ref 65–99)
Potassium: 4.1 mmol/L (ref 3.5–5.3)
Sodium: 142 mmol/L (ref 135–146)
Total Bilirubin: 0.5 mg/dL (ref 0.2–1.2)
Total Protein: 7 g/dL (ref 6.1–8.1)
eGFR: 37 mL/min/{1.73_m2} — ABNORMAL LOW (ref >=60)

## 2022-03-31 LAB — CBC WITH DIFFERENTIAL/PLATELET
Absolute Monocytes: 616 cells/uL (ref 200–950)
Basophils Absolute: 38 cells/uL (ref 0–200)
Basophils Relative: 0.5 %
Eosinophils Absolute: 198 cells/uL (ref 15–500)
Eosinophils Relative: 2.6 %
HCT: 39 % (ref 38.5–50.0)
Hemoglobin: 13.5 g/dL (ref 13.2–17.1)
Lymphs Abs: 1634 cells/uL (ref 850–3900)
MCH: 30.9 pg (ref 27.0–33.0)
MCHC: 34.6 g/dL (ref 32.0–36.0)
MCV: 89.2 fL (ref 80.0–100.0)
MPV: 10.2 fL (ref 7.5–12.5)
Monocytes Relative: 8.1 %
Neutro Abs: 5115 cells/uL (ref 1500–7800)
Neutrophils Relative %: 67.3 %
Platelets: 171 10*3/uL (ref 140–400)
RBC: 4.37 10*6/uL (ref 4.20–5.80)
RDW: 13 % (ref 11.0–15.0)
Total Lymphocyte: 21.5 %
WBC: 7.6 10*3/uL (ref 3.8–10.8)

## 2022-03-31 LAB — URINALYSIS, ROUTINE W REFLEX MICROSCOPIC
Bacteria, UA: NONE SEEN /HPF
Bilirubin Urine: NEGATIVE
Hgb urine dipstick: NEGATIVE
Hyaline Cast: NONE SEEN /LPF
Ketones, ur: NEGATIVE
Leukocytes,Ua: NEGATIVE
Nitrite: NEGATIVE
RBC / HPF: NONE SEEN /HPF (ref 0–2)
Specific Gravity, Urine: 1.024 (ref 1.001–1.035)
Squamous Epithelial / HPF: NONE SEEN /HPF (ref <=5)
WBC, UA: NONE SEEN /HPF (ref 0–5)
pH: <=5 (ref 5.0–8.0)

## 2022-03-31 LAB — VITAMIN D 25 HYDROXY (VIT D DEFICIENCY, FRACTURES): Vit D, 25-Hydroxy: 51 ng/mL (ref 30–100)

## 2022-03-31 LAB — LIPID PANEL
Cholesterol: 174 mg/dL (ref <200)
HDL: 30 mg/dL — ABNORMAL LOW (ref >=40)
LDL Cholesterol (Calc): 107 mg/dL (calc) — ABNORMAL HIGH
Non-HDL Cholesterol (Calc): 144 mg/dL (calc) — ABNORMAL HIGH (ref <130)
Total CHOL/HDL Ratio: 5.8 (calc) — ABNORMAL HIGH (ref <5.0)
Triglycerides: 241 mg/dL — ABNORMAL HIGH (ref <150)

## 2022-03-31 LAB — INSULIN, RANDOM: Insulin: 27.8 u[IU]/mL — ABNORMAL HIGH

## 2022-03-31 LAB — HEMOGLOBIN A1C
Hgb A1c MFr Bld: 6.8 % of total Hgb — ABNORMAL HIGH (ref <5.7)
Mean Plasma Glucose: 148 mg/dL
eAG (mmol/L): 8.2 mmol/L

## 2022-03-31 LAB — MAGNESIUM: Magnesium: 1.9 mg/dL (ref 1.5–2.5)

## 2022-03-31 LAB — MICROALBUMIN / CREATININE URINE RATIO
Creatinine, Urine: 147 mg/dL (ref 20–320)
Microalb Creat Ratio: 15 mcg/mg creat (ref <30)
Microalb, Ur: 2.2 mg/dL

## 2022-03-31 LAB — PTH, INTACT AND CALCIUM
Calcium: 9.5 mg/dL (ref 8.6–10.3)
PTH: 23 pg/mL (ref 16–77)

## 2022-03-31 LAB — TSH: TSH: 1.87 mIU/L (ref 0.40–4.50)

## 2022-03-31 LAB — LEVETIRACETAM, IMMUNOASSAY: LEVETIRACETAM, IMMUNOASSAY: 17.6 ug/mL (ref 6.0–46.0)

## 2022-03-31 LAB — PSA: PSA: 1.12 ng/mL (ref <=4.00)

## 2022-04-02 NOTE — Progress Notes (Signed)
<><><><><><><><><><><><><><><><><><><><><><><><><><><><><><><><><> <><><><><><><><><><><><><><><><><><><><><><><><><><><><><><><><><>  -   Keppra Level finally returned & is OK - Please continue dose same   <><><><><><><><><><><><><><><><><><><><><><><><><><><><><><><><><> <><><><><><><><><><><><><><><><><><><><><><><><><><><><><><><><><>

## 2022-04-05 ENCOUNTER — Telehealth: Payer: Self-pay | Admitting: *Deleted

## 2022-04-05 ENCOUNTER — Other Ambulatory Visit: Payer: Self-pay | Admitting: Internal Medicine

## 2022-04-05 DIAGNOSIS — F419 Anxiety disorder, unspecified: Secondary | ICD-10-CM

## 2022-04-05 MED ORDER — CITALOPRAM HYDROBROMIDE 40 MG PO TABS
ORAL_TABLET | ORAL | 3 refills | Status: DC
Start: 2022-04-05 — End: 2023-04-30

## 2022-04-05 NOTE — Telephone Encounter (Signed)
[place],  This is a patient of Dr. Ardis Hughs who was changed to my [place] schedule.  Please contact this patient and let him know that anesthesia has discovered he has a history of difficult intubation, and therefore his procedure must be done in the hospital outpatient endoscopy department.  His colonoscopy is scheduled for next week in the [place] must be canceled, and we have to put him on the waiting list for hospital procedures.  If this patient has further questions regarding the anesthesia concerns,John Nulty can certainly contact him.  - HD

## 2022-04-05 NOTE — Telephone Encounter (Signed)
Attempted to reach patient on home phone twice. The lines rings and then goes to a fast busy signal. No option to leave a vm.   I also attempted to reach patient on his mobile phone. No vm is set up. Will attempt to reach patient by phone at a different time.   [facility] procedure cancelled. Patient added to Dr. Loletha Carrow' hospital wait list.

## 2022-04-05 NOTE — Telephone Encounter (Signed)
Dr.Danis,  This pt is scheduled with you on Aug 23.  He is a documented difficult intubation and his procedure will need to be done at the hospital.  Signed                                      Procedure Name: Intubation Date/Time: 12/29/2019 3:57 PM Performed by: Flowers, Rokoshi T, CRNA Pre-anesthesia Checklist: Patient identified, Emergency Drugs available, Suction available and Patient being monitored Patient Re-evaluated:Patient Re-evaluated prior to induction Oxygen Delivery Method: Circle system utilized Preoxygenation: Pre-oxygenation with 100% oxygen Induction Type: IV induction Ventilation: Mask ventilation without difficulty Laryngoscope Size: Glidescope and 3 Grade View: Grade I Tube type: Oral Tube size: 7.5 mm Number of attempts: 1 Airway Equipment and Method: Stylet,  Oral airway and Video-laryngoscopy Placement Confirmation: ETT inserted through vocal cords under direct vision,  positive ETCO2 and breath sounds checked- equal and bilateral Secured at: 22 cm Tube secured with: Tape Dental Injury: Teeth and Oropharynx as per pre-operative assessment  Difficulty Due To: Difficulty was anticipated, Difficult Airway- due to anterior larynx, Difficult Airway- due to large tongue and Difficult Airway- due to limited oral opening Future Recommendations: Recommend- induction with short-acting agent, and alternative techniques readily available           Thanks,  Osvaldo Angst

## 2022-04-06 DIAGNOSIS — N1832 Chronic kidney disease, stage 3b: Secondary | ICD-10-CM | POA: Diagnosis not present

## 2022-04-06 NOTE — Telephone Encounter (Signed)
Called and spoke with patient's wife Vickii Chafe), pt is not home at this time. I informed Vickii Chafe that we had to cancel [place] appt due to documentation that patient was a difficult intubation. She is aware that we have added him to the hospital wait list and we will contact him once we have some available dates. Vickii Chafe is aware that it will likely be a couple of months before his procedure can be performed, but we do have him on the wait list. Peggy verbalized understanding of all information and states that she will let the patient know.

## 2022-04-10 DIAGNOSIS — E1122 Type 2 diabetes mellitus with diabetic chronic kidney disease: Secondary | ICD-10-CM | POA: Diagnosis not present

## 2022-04-10 DIAGNOSIS — N400 Enlarged prostate without lower urinary tract symptoms: Secondary | ICD-10-CM | POA: Diagnosis not present

## 2022-04-10 DIAGNOSIS — N1832 Chronic kidney disease, stage 3b: Secondary | ICD-10-CM | POA: Diagnosis not present

## 2022-04-10 DIAGNOSIS — N281 Cyst of kidney, acquired: Secondary | ICD-10-CM | POA: Diagnosis not present

## 2022-04-10 DIAGNOSIS — I129 Hypertensive chronic kidney disease with stage 1 through stage 4 chronic kidney disease, or unspecified chronic kidney disease: Secondary | ICD-10-CM | POA: Diagnosis not present

## 2022-04-11 ENCOUNTER — Encounter: Payer: Medicare Other | Admitting: Gastroenterology

## 2022-04-12 ENCOUNTER — Encounter: Payer: Medicare Other | Admitting: Gastroenterology

## 2022-04-26 ENCOUNTER — Telehealth: Payer: Self-pay | Admitting: Gastroenterology

## 2022-04-26 DIAGNOSIS — Z86010 Personal history of colonic polyps: Secondary | ICD-10-CM

## 2022-04-26 NOTE — Telephone Encounter (Signed)
Patient called to schedule hospital procedure. Please call to schedule. Thank you

## 2022-04-27 ENCOUNTER — Other Ambulatory Visit: Payer: Self-pay

## 2022-04-27 ENCOUNTER — Telehealth: Payer: Self-pay

## 2022-04-27 ENCOUNTER — Encounter: Payer: Self-pay | Admitting: Internal Medicine

## 2022-04-27 DIAGNOSIS — Z86010 Personal history of colonic polyps: Secondary | ICD-10-CM

## 2022-04-27 NOTE — Telephone Encounter (Signed)
Returned call to patient. I left pt a detailed vm on his home phone offering him a colonoscopy appt at [facility] on 05/04/22 with Dr. Loletha Carrow. This appt would be at 11 am, pt would need to arrive at [facility] with a care partner by 9:30 am. I would need to send prep to pharmacy and instructions to patient's MyChart. I asked that patient give me a call back ASAP to discuss further.

## 2022-04-27 NOTE — Telephone Encounter (Signed)
Error

## 2022-04-27 NOTE — Telephone Encounter (Signed)
Pt returned call. He confirmed that he is able to make appt at [facility] on 05/04/22 at 11 am. Pt is aware that he will need to arrive at [facility] by 9:30 am. Pt states that he still has his Suprep. I told pt that I will send his instructions via MyChart. Pt will let us know if he has any issues accessing this. Pt verbalized understanding of all information and had no concerns at the end of the call.

## 2022-04-27 NOTE — Telephone Encounter (Signed)
Secure staff message sent to authorization team due to procedure being next week.

## 2022-05-04 ENCOUNTER — Ambulatory Visit (HOSPITAL_COMMUNITY)
Admission: RE | Admit: 2022-05-04 | Discharge: 2022-05-04 | Disposition: A | Discharge status: Home / Self Care | Payer: Medicare Other | Attending: Gastroenterology | Admitting: Gastroenterology

## 2022-05-04 ENCOUNTER — Encounter (HOSPITAL_COMMUNITY): Payer: Self-pay | Admitting: Gastroenterology

## 2022-05-04 ENCOUNTER — Other Ambulatory Visit: Payer: Self-pay

## 2022-05-04 ENCOUNTER — Ambulatory Visit (HOSPITAL_COMMUNITY): Payer: Medicare Other | Admitting: Certified Registered Nurse Anesthetist

## 2022-05-04 ENCOUNTER — Encounter (HOSPITAL_COMMUNITY)
Admission: RE | Disposition: A | Discharge status: Home / Self Care | Payer: Self-pay | Source: Home / Self Care | Attending: Gastroenterology

## 2022-05-04 ENCOUNTER — Ambulatory Visit (HOSPITAL_BASED_OUTPATIENT_CLINIC_OR_DEPARTMENT_OTHER): Payer: Medicare Other | Admitting: Certified Registered Nurse Anesthetist

## 2022-05-04 DIAGNOSIS — N189 Chronic kidney disease, unspecified: Secondary | ICD-10-CM | POA: Diagnosis not present

## 2022-05-04 DIAGNOSIS — D120 Benign neoplasm of cecum: Secondary | ICD-10-CM

## 2022-05-04 DIAGNOSIS — E785 Hyperlipidemia, unspecified: Secondary | ICD-10-CM | POA: Diagnosis not present

## 2022-05-04 DIAGNOSIS — K5730 Diverticulosis of large intestine without perforation or abscess without bleeding: Secondary | ICD-10-CM

## 2022-05-04 DIAGNOSIS — I10 Essential (primary) hypertension: Secondary | ICD-10-CM

## 2022-05-04 DIAGNOSIS — Z6833 Body mass index (BMI) 33.0-33.9, adult: Secondary | ICD-10-CM | POA: Insufficient documentation

## 2022-05-04 DIAGNOSIS — I129 Hypertensive chronic kidney disease with stage 1 through stage 4 chronic kidney disease, or unspecified chronic kidney disease: Secondary | ICD-10-CM | POA: Insufficient documentation

## 2022-05-04 DIAGNOSIS — Z09 Encounter for follow-up examination after completed treatment for conditions other than malignant neoplasm: Secondary | ICD-10-CM

## 2022-05-04 DIAGNOSIS — E669 Obesity, unspecified: Secondary | ICD-10-CM | POA: Insufficient documentation

## 2022-05-04 DIAGNOSIS — Z860100 Personal history of colon polyps, unspecified: Secondary | ICD-10-CM

## 2022-05-04 DIAGNOSIS — K648 Other hemorrhoids: Secondary | ICD-10-CM

## 2022-05-04 DIAGNOSIS — K635 Polyp of colon: Secondary | ICD-10-CM | POA: Diagnosis not present

## 2022-05-04 DIAGNOSIS — K219 Gastro-esophageal reflux disease without esophagitis: Secondary | ICD-10-CM | POA: Insufficient documentation

## 2022-05-04 DIAGNOSIS — Z1211 Encounter for screening for malignant neoplasm of colon: Secondary | ICD-10-CM | POA: Diagnosis not present

## 2022-05-04 DIAGNOSIS — E1122 Type 2 diabetes mellitus with diabetic chronic kidney disease: Secondary | ICD-10-CM | POA: Insufficient documentation

## 2022-05-04 DIAGNOSIS — G4733 Obstructive sleep apnea (adult) (pediatric): Secondary | ICD-10-CM | POA: Diagnosis not present

## 2022-05-04 DIAGNOSIS — Z9989 Dependence on other enabling machines and devices: Secondary | ICD-10-CM | POA: Diagnosis not present

## 2022-05-04 HISTORY — PX: POLYPECTOMY: SHX5525

## 2022-05-04 HISTORY — PX: COLONOSCOPY WITH PROPOFOL: SHX5780

## 2022-05-04 LAB — GLUCOSE, CAPILLARY: Glucose-Capillary: 183 mg/dL — ABNORMAL HIGH (ref 70–99)

## 2022-05-04 SURGERY — COLONOSCOPY WITH PROPOFOL
Anesthesia: Monitor Anesthesia Care

## 2022-05-04 MED ORDER — LACTATED RINGERS IV SOLN: INTRAVENOUS | Status: DC

## 2022-05-04 MED ORDER — PROPOFOL 500 MG/50ML IV EMUL
INTRAVENOUS | Status: DC | PRN
  Administered 2022-05-04: 125 ug/kg/min via INTRAVENOUS

## 2022-05-04 MED ORDER — SODIUM CHLORIDE 0.9 % IV SOLN: INTRAVENOUS | Status: DC

## 2022-05-04 MED ORDER — EPHEDRINE SULFATE-NACL 50-0.9 MG/10ML-% IV SOSY
PREFILLED_SYRINGE | INTRAVENOUS | Status: DC | PRN
  Administered 2022-05-04: 5 mg via INTRAVENOUS

## 2022-05-04 MED ORDER — ONDANSETRON HCL 4 MG/2ML IJ SOLN
INTRAMUSCULAR | Status: DC | PRN
  Administered 2022-05-04: 4 mg via INTRAVENOUS

## 2022-05-04 MED ORDER — PROPOFOL 10 MG/ML IV BOLUS
INTRAVENOUS | Status: DC | PRN
  Administered 2022-05-04: 50 mg via INTRAVENOUS
  Administered 2022-05-04 (×2): 30 mg via INTRAVENOUS

## 2022-05-04 MED ORDER — PROPOFOL 1000 MG/100ML IV EMUL
INTRAVENOUS | Status: AC
  Filled 2022-05-04: qty 100

## 2022-05-04 SURGICAL SUPPLY — 22 items

## 2022-05-04 NOTE — Interval H&P Note (Signed)
History and Physical Interval Note:  05/04/2022 10:58 AM  Jeffery Reed  has presented today for surgery, with the diagnosis of hx of colon polyps, difficult airway.  The various methods of treatment have been discussed with the patient and family. After consideration of risks, benefits and other options for treatment, the patient has consented to  Procedure(s): COLONOSCOPY WITH PROPOFOL (N/A) as a surgical intervention.  The patient's history has been reviewed, patient examined, no change in status, stable for surgery.  I have reviewed the patient's chart and labs.  Questions were answered to the patient's satisfaction.     Nelida Meuse III

## 2022-05-04 NOTE — Anesthesia Postprocedure Evaluation (Signed)
Anesthesia Post Note  Patient: Jeffery Reed  Procedure(s) Performed: COLONOSCOPY WITH PROPOFOL POLYPECTOMY     Patient location during evaluation: PACU Anesthesia Type: MAC Level of consciousness: awake and alert and oriented Pain management: pain level controlled Vital Signs Assessment: post-procedure vital signs reviewed and stable Respiratory status: spontaneous breathing, nonlabored ventilation and respiratory function stable Cardiovascular status: stable and blood pressure returned to baseline Postop Assessment: no apparent nausea or vomiting Anesthetic complications: no   No notable events documented.  Last Vitals:  Vitals:   05/04/22 1130 05/04/22 1140  BP: 110/67 125/71  Pulse: 72 71  Resp: (!) 24 (!) 23  Temp:    SpO2: 99% 93%    Last Pain:  Vitals:   05/04/22 1140  TempSrc:   PainSc: 0-No pain                 Jazmyne Beauchesne A.

## 2022-05-04 NOTE — H&P (Signed)
History and Physical:  This patient presents for endoscopic testing for: History of colon polyps  76 year old man here for surveillance colonoscopy.  He had a colonoscopy with Dr. Ardis Hughs in June 2020, at which time 3 subcentimeter tubular adenomas without H GD and SSP's without dysplasia were removed. Procedures done in the hospital endoscopy lab due to anesthesia chart review discovering history of difficult intubation. Patient is otherwise without complaints or active issues today. Patient denies chronic abdominal pain, rectal bleeding, constipation or diarrhea.   Past Medical History: Past Medical History:  Diagnosis Date   Allergy    Anxiety    Cataract    beginning stage 01/22/2019   Chronic kidney disease    enlarge bladder   Diabetes mellitus without complication (HCC)    GERD (gastroesophageal reflux disease)    Gout    Hyperlipidemia    Hypertension    OSA (obstructive sleep apnea)    uses CPAP   Pre-diabetes    Seizures ([place])    last 3 years ago in MAY   Sleep apnea    c pap     Past Surgical History: Past Surgical History:  Procedure Laterality Date   COLONOSCOPY  2020   Dr.Jacobs   CRANIOTOMY Right 12/29/2019   Procedure: Craniotomy for Tumor;  Surgeon: Kristeen Miss, MD;  Location: [facility];  Service: Neurosurgery;  Laterality: Right;  Craniotomy for Tumor Excision   FOOT SURGERY  10/2009   MYRINGOTOMY Left 02/2005   Dr.Newman   TONSILLECTOMY  1953    Allergies: Allergies  Allergen Reactions   Cardura [Doxazosin Mesylate] Other (See Comments)    Nasal congestion   Codeine Other (See Comments)    The patient passed out   Hytrin [Terazosin] Other (See Comments)    Reaction not recalled    Outpatient Meds: Current Facility-Administered Medications  Medication Dose Route Frequency Provider Last Rate Last Admin   0.9 %  sodium chloride infusion   Intravenous Continuous Nelida Meuse III, MD 20 mL/hr at 05/04/22 0928 New Bag at 05/04/22 0928       ___________________________________________________________________ Objective   Exam:  BP 128/73   Pulse 75   Temp 98.2 F (36.8 C) (Temporal)   Resp 19   Ht '5\' 10"'$  (1.778 m)   Wt 106.6 kg   SpO2 98%   BMI 33.72 kg/m   CV: RRR without murmur, S1/S2 Resp: clear to auscultation bilaterally, normal RR and effort noted GI: soft, no tenderness, with active bowel sounds.   Assessment: History of colon poylps   Plan: Colonoscopy  The benefits and risks of the planned procedure were described in detail with the patient or (when appropriate) their health care proxy.  Risks were outlined as including, but not limited to, bleeding, infection, perforation, adverse medication reaction leading to cardiac or pulmonary decompensation, pancreatitis (if ERCP).  The limitation of incomplete mucosal visualization was also discussed.  No guarantees or warranties were given.    The patient is appropriate for an endoscopic procedure in the ambulatory setting.   - Wilfrid Lund, MD

## 2022-05-04 NOTE — Op Note (Signed)
[facility] Patient Name: Jeffery Reed Procedure Date: 05/04/2022 MRN: [id number] Attending Reed: Jeffery Reed , Reed Date of Birth: 05-07-1946 CSN: [id number] Age: 76 Admit Type: Outpatient Procedure:                Colonoscopy Indications:              Surveillance: Personal history of adenomatous                            polyps on last colonoscopy 3 years ago                           TA and SSP < 43m x ; 22 January 2019 Providers:                Jeffery Reed, Jeffery Gitelman RN, Jeffery Reed Technician Referring Reed:              Medicines:                Monitored Anesthesia Care Complications:            No immediate complications. Estimated Blood Loss:     Estimated blood loss was minimal. Procedure:                Pre-Anesthesia Assessment:                           - Prior to the procedure, a History and Physical                            was performed, and patient medications and                            allergies were reviewed. The patient's tolerance of                            previous anesthesia was also reviewed. The risks                            and benefits of the procedure and the sedation                            options and risks were discussed with the patient.                            All questions were answered, and informed consent                            was obtained. Prior Anticoagulants: The patient has                            taken no previous anticoagulant or antiplatelet  agents. ASA Grade Assessment: II - A patient with                            mild systemic disease. After reviewing the risks                            and benefits, the patient was deemed in                            satisfactory condition to undergo the procedure.                           After obtaining informed consent, the colonoscope                            was passed under direct  vision. Throughout the                            procedure, the patient's blood pressure, pulse, and                            oxygen saturations were monitored continuously. The                            CF-HQ190L ([id number]) Olympus colonoscope was                            introduced through the anus and advanced to the the                            cecum, identified by appendiceal orifice and                            ileocecal valve. The colonoscopy was performed                            without difficulty. The patient tolerated the                            procedure well. The quality of the bowel                            preparation was good. The ileocecal valve,                            appendiceal orifice, and rectum were photographed. Scope In: 11:01:47 AM Scope Out: 11:19:38 AM Scope Withdrawal Time: 0 hours 12 minutes 38 seconds  Total Procedure Duration: 0 hours 17 minutes 51 seconds  Findings:      The perianal and digital rectal examinations were normal.      Repeat examination of right colon under NBI performed.      A 6 mm polyp was found in the cecum. The polyp was semi-sessile with a       mucus cap. The polyp was removed with a  cold snare. Resection and       retrieval were complete.      Multiple diverticula were found in the left colon and right colon.      Internal hemorrhoids were found.      The exam was otherwise without abnormality on direct and retroflexion       views. Impression:               - One 6 mm polyp in the cecum, removed with a cold                            snare. Resected and retrieved.                           - Diverticulosis in the left colon and in the right                            colon.                           - Internal hemorrhoids.                           - The examination was otherwise normal on direct                            and retroflexion views. Moderate Sedation:      MAC sedation used Recommendation:            - Patient has a contact number available for                            emergencies. The signs and symptoms of potential                            delayed complications were discussed with the                            patient. Return to normal activities tomorrow.                            Written discharge instructions were provided to the                            patient.                           - Resume previous diet.                           - Continue present medications.                           - Await pathology results.                           - No repeat surveillance colonoscopy recommended. Procedure Code(s):        --- Professional ---  45385, Colonoscopy, flexible; with removal of                            tumor(s), polyp(s), or other lesion(s) by snare                            technique Diagnosis Code(s):        --- Professional ---                           Z86.010, Personal history of colonic polyps                           K63.5, Polyp of colon                           K64.8, Other hemorrhoids                           K57.30, Diverticulosis of large intestine without                            perforation or abscess without bleeding CPT copyright 2019 [employer] Association. All rights reserved. The codes documented in this report are preliminary and upon coder review may  be revised to meet current compliance requirements. Jeffery Reed L. Loletha Carrow, Reed 05/04/2022 11:25:03 AM This report has been signed electronically. Number of Addenda: 0

## 2022-05-04 NOTE — Anesthesia Procedure Notes (Signed)
Procedure Name: MAC Date/Time: 05/04/2022 10:59 AM  Performed by: Maxwell Caul, CRNAPre-anesthesia Checklist: Patient identified, Emergency Drugs available, Suction available and Patient being monitored Oxygen Delivery Method: Simple face mask Preoxygenation: Pre-oxygenation with 100% oxygen Induction Type: IV induction Placement Confirmation: positive ETCO2 and breath sounds checked- equal and bilateral Dental Injury: Teeth and Oropharynx as per pre-operative assessment

## 2022-05-04 NOTE — Anesthesia Preprocedure Evaluation (Signed)
Anesthesia Evaluation  Patient identified by MRN, date of birth, ID band Patient awake    Reviewed: Allergy & Precautions, NPO status , Patient's Chart, lab work & pertinent test results  History of Anesthesia Complications (+) DIFFICULT AIRWAY and history of anesthetic complications  Airway Mallampati: IV  TM Distance: <3 FB Neck ROM: Limited  Mouth opening: Limited Mouth Opening  Dental  (+) Teeth Intact, Dental Advisory Given, Caps Retrognathia:   Pulmonary sleep apnea and Continuous Positive Airway Pressure Ventilation ,  Non compliant with CPAP   Pulmonary exam normal breath sounds clear to auscultation       Cardiovascular hypertension, Pt. on medications Normal cardiovascular exam Rhythm:Regular Rate:Normal     Neuro/Psych Seizures -, Well Controlled,  Anxiety negative psych ROS   GI/Hepatic Neg liver ROS, GERD  Medicated,Hx/o colon polyps   Endo/Other  diabetes, Well Controlled, Type 2, Oral Hypoglycemic AgentsObesity Hyperlipidemia  Renal/GU Renal InsufficiencyRenal disease  negative genitourinary   Musculoskeletal negative musculoskeletal ROS (+)   Abdominal (+) + obese,   Peds  Hematology   Anesthesia Other Findings   Reproductive/Obstetrics                             Anesthesia Physical Anesthesia Plan  ASA: 3  Anesthesia Plan: MAC   Post-op Pain Management: Minimal or no pain anticipated   Induction: Intravenous  PONV Risk Score and Plan: 1 and Propofol infusion and Treatment may vary due to age or medical condition  Airway Management Planned: Natural Airway, Nasal Cannula and Simple Face Mask  Additional Equipment: None  Intra-op Plan:   Post-operative Plan:   Informed Consent: I have reviewed the patients History and Physical, chart, labs and discussed the procedure including the risks, benefits and alternatives for the proposed anesthesia with the patient or  authorized representative who has indicated his/her understanding and acceptance.     Dental advisory given  Plan Discussed with: CRNA and Anesthesiologist  Anesthesia Plan Comments:         Anesthesia Quick Evaluation

## 2022-05-04 NOTE — Discharge Instructions (Signed)

## 2022-05-04 NOTE — Transfer of Care (Signed)
Immediate Anesthesia Transfer of Care Note  Patient: Jeffery Reed  Procedure(s) Performed: COLONOSCOPY WITH PROPOFOL POLYPECTOMY  Patient Location: PACU and Endoscopy Unit  Anesthesia Type:MAC  Level of Consciousness: awake, alert  and oriented  Airway & Oxygen Therapy: Patient Spontanous Breathing and Patient connected to face mask oxygen  Post-op Assessment: Report given to RN and Post -op Vital signs reviewed and stable  Post vital signs: Reviewed and stable  Last Vitals:  Vitals Value Taken Time  BP    Temp    Pulse 72 05/04/22 1129  Resp 23 05/04/22 1129  SpO2 91 % 05/04/22 1129  Vitals shown include unvalidated device data.  Last Pain:  Vitals:   05/04/22 0925  TempSrc: Temporal  PainSc: 0-No pain         Complications: No notable events documented.

## 2022-05-05 ENCOUNTER — Encounter: Payer: Self-pay | Admitting: Internal Medicine

## 2022-05-05 ENCOUNTER — Ambulatory Visit (INDEPENDENT_AMBULATORY_CARE_PROVIDER_SITE_OTHER): Payer: Medicare Other | Admitting: Internal Medicine

## 2022-05-05 VITALS — BP 110/58 | HR 76 | Ht 70.0 in | Wt 215.2 lb

## 2022-05-05 DIAGNOSIS — N183 Chronic kidney disease, stage 3 unspecified: Secondary | ICD-10-CM | POA: Diagnosis not present

## 2022-05-05 DIAGNOSIS — E1169 Type 2 diabetes mellitus with other specified complication: Secondary | ICD-10-CM

## 2022-05-05 DIAGNOSIS — E1122 Type 2 diabetes mellitus with diabetic chronic kidney disease: Secondary | ICD-10-CM | POA: Diagnosis not present

## 2022-05-05 DIAGNOSIS — N1832 Chronic kidney disease, stage 3b: Secondary | ICD-10-CM

## 2022-05-05 DIAGNOSIS — E785 Hyperlipidemia, unspecified: Secondary | ICD-10-CM

## 2022-05-05 LAB — SURGICAL PATHOLOGY

## 2022-05-05 MED ORDER — METFORMIN HCL 500 MG PO TABS: 500.0000 mg | ORAL_TABLET | Freq: Two times a day (BID) | ORAL | 3 refills | Status: DC

## 2022-05-05 MED ORDER — GLIPIZIDE 5 MG PO TABS: 10.0000 mg | ORAL_TABLET | Freq: Two times a day (BID) | ORAL | 3 refills | Status: DC

## 2022-05-05 NOTE — Patient Instructions (Addendum)
Please continue: - Metformin 500 mg 2x a day - Glipizide 10 mg before b'fast and 10 mg dinner - Trulicity 4.5 mg weekly  Try CoQ10 for the muscle cramps.  Please return in 3-4 months with your sugar log.

## 2022-05-05 NOTE — Progress Notes (Signed)
Patient ID: Jeffery Reed, male   DOB: November 13, 1945, 76 y.o.   MRN: [id number]   HPI: Jeffery Reed is a 76 y.o.-year-old male, initially referred by his PCP, Unk Pinto, MD, returning for follow-up for DM2, dx in 2018, insulin-dependent since 12/2019, uncontrolled, with complications (CKD stage III, diabetic retinopathy).  Last visit 3.5 months ago.  Interim history: No blurry vision, nausea, chest pain. He had a colonoscopy yesterday - 1 polyp resected.  Brother and wife are sick right now.   Reviewed HbA1c levels: Lab Results  Component Value Date   HGBA1C 6.8 (H) 03/29/2022   HGBA1C 6.8 (A) 01/23/2022   HGBA1C 7.1 (H) 09/14/2021   HGBA1C 6.5 (H) 06/15/2021   HGBA1C 6.0 (H) 03/03/2021   HGBA1C 6.7 (A) 01/19/2021   HGBA1C 5.7 (A) 09/20/2020   HGBA1C 5.5 05/20/2020   HGBA1C 6.3 (H) 03/02/2020   HGBA1C 8.1 (H) 12/29/2019   Pt is on a regimen of: - Metformin ER 1000 mg 2x a day, with meals >> ... >> 500 mg 2x a day >> off x 1 week for the colonoscopy - restarted last night - Glipizide 05-25-09 >> 10-10 >> 05-25-09 >> 10 mg 2x a day, before meals - Trulicity 3.70 mg weekly - started 01/2021 >> 1.5 mg >> 3 >> 4.5 mg weekly (by PCP) -  >> stopped 04/2020   Pt checks his sugars once a day: - am: 97-169, 175, 198 >> 48-889 >> off Trulicity: 169 >> 45-038 >> 110-120 - 2h after b'fast: 100, 148, 243 >> 111-167 >> n/c >> 140-159 >> n/c - before lunch: 158, 177, 199 >> n/c >> 144-167 >> n/c >> 185, 210 >> 80-100 - 2h after lunch: 202 >> n/c >> 118 >> 125, 127 >> n/c  - before dinner:  126, 301 >> 882 >> off Trulicity: 800 >> N/c - 2h after dinner: n/c >> 234 >> n/c >> 240 >> n/c >> 260 >> n/c - bedtime: 92, 230 >> n/c >> 94 >> n/c >> 140, 158, 310 >> 240 - nighttime: n/c Lowest sugar was 82 >> 78 >> 82; he has hypoglycemia awareness at 100. Highest sugar was 214 >> 310 >> 182 (gatorade)  Glucometer: Freestyle  Pt's meals are: - Breakfast:  Instant oatmeal or [employer]  sausage bisuit or scrambled eggs - Lunch: tomato sandwich, ham - Dinner: larger meal: potatoes; spaghetti, lasagna, chicken, veggies: green beans, corn, broccoli - Snacks:potato chips, apples Diet sodas, unsweet tea,  -+ CKD stage 3b-sees nephrology, last BUN/creatinine:  Lab Results  Component Value Date   BUN 29 (H) 03/29/2022   BUN 26 (H) 12/22/2021   CREATININE 1.89 (H) 03/29/2022   CREATININE 2.04 (H) 12/22/2021  02/2021: GFR 35 On valsartan 320.  -+ HL; last set of lipids: Lab Results  Component Value Date   CHOL 174 03/29/2022   HDL 30 (L) 03/29/2022   LDLCALC 107 (H) 03/29/2022   TRIG 241 (H) 03/29/2022   CHOLHDL 5.8 (H) 03/29/2022  On Crestor 10, fenofibrate 160.  - last eye exam was on 02/28/2022: No DR; prev.+ DR, + small cataract.   -+ Mild, occasional numbness and tingling in his feet.  Foot exam performed 03/28/2022.  Pt has no FH of DM.  He also has HTN, OSA. Patient has a history of meningioma surgery in 2021.  He recovered well afterwards.  However, he had a seizure in 2022  >> started Keppra.  He is a retired Airline pilot.  During the winter, he drives  a schoolbus.   ROS: + See HPI  I reviewed pt's medications, allergies, PMH, social hx, family hx, and changes were documented in the history of present illness. Otherwise, unchanged from my initial visit note.  Past Medical History:  Diagnosis Date   Allergy    Anxiety    Cataract    beginning stage 01/22/2019   Chronic kidney disease    enlarge bladder   Diabetes mellitus without complication (HCC)    GERD (gastroesophageal reflux disease)    Gout    Hyperlipidemia    Hypertension    OSA (obstructive sleep apnea)    uses CPAP   Pre-diabetes    Seizures ([place])    last 3 years ago in MAY   Sleep apnea    c pap   Past Surgical History:  Procedure Laterality Date   COLONOSCOPY  2020   Dr.Jacobs   CRANIOTOMY Right 12/29/2019   Procedure: Craniotomy for Tumor;  Surgeon: Kristeen Miss, MD;   Location: [facility];  Service: Neurosurgery;  Laterality: Right;  Craniotomy for Tumor Excision   FOOT SURGERY  10/2009   MYRINGOTOMY Left 02/2005   Dr.Newman   TONSILLECTOMY  1953   Social History   Socioeconomic History   Marital status: Married    Spouse name: Not on file   Number of children: Not on file   Years of education: Not on file   Highest education level: Not on file  Occupational History   Occupation: Recruitment consultant    Comment: on STD 10/25/20  Tobacco Use   Smoking status: Never   Smokeless tobacco: Never  Vaping Use   Vaping Use: Never used  Substance and Sexual Activity   Alcohol use: No   Drug use: No   Sexual activity: Not on file  Other Topics Concern   Not on file  Social History Narrative   Lives with wife   Right handed   Drinks 3-4 cups caffeine daily   Social Determinants of Health   Financial Resource Strain: Not on file  Food Insecurity: No Food Insecurity (11/08/2021)   Hunger Vital Sign    Worried About Running Out of Food in the Last Year: Never true    Ran Out of Food in the Last Year: Never true  Transportation Needs: No Transportation Needs (11/08/2021)   PRAPARE - Hydrologist (Medical): No    Lack of Transportation (Non-Medical): No  Physical Activity: Not on file  Stress: Not on file  Social Connections: Not on file  Intimate Partner Violence: Not on file   Current Outpatient Medications on File Prior to Visit  Medication Sig Dispense Refill   acetaminophen (TYLENOL) 650 MG CR tablet Take 650 mg by mouth every 8 (eight) hours as needed for pain (or headaches).     Cholecalciferol (VITAMIN D3) 10000 units capsule Take 10,000 Units by mouth in the morning.     citalopram (CELEXA) 40 MG tablet Take  1 tablet  Daily  for Mood & Chronic Anxiety 90 tablet 3   Dulaglutide (TRULICITY) 4.5 WR/6.0AV SOPN Inject  4.5 mg  into Skin  every 7 days  for Diabetes  (Dx: e11.29 ) (Patient taking differently: Inject 4.5 mg into  the skin every Saturday. Inject  4.5 mg  into Skin  every 7 days  for Diabetes  (Dx: e11.29 )) 6 mL 3   fenofibrate 160 MG tablet Take 1 tablet  Daily for Triglycerides (Blood Fats)                                     /  TAKE                            BY                        MOUTH (Patient taking differently: Take 160 mg by mouth every evening.) 90 tablet 3   fluticasone (FLONASE) 50 MCG/ACT nasal spray Place 1-2 sprays into both nostrils daily as needed for allergies or rhinitis.     glipiZIDE (GLUCOTROL) 5 MG tablet TAKE 1 TO 2 TABLETS BY  MOUTH 3 TIMES DAILY WITH  MEALS FOR DIABETES (Patient taking differently: Take 10 mg by mouth 2 (two) times daily before a meal.) 540 tablet 3   glucose blood (FREESTYLE LITE) test strip Check blood sugar 1 time daily-DX-E11.22 100 each 5   levETIRAcetam (KEPPRA) 500 MG tablet Take 1 tablet (500 mg total) by mouth 2 (two) times daily. 180 tablet 3   metFORMIN (GLUCOPHAGE) 500 MG tablet Take 1 tablet (500 mg total) by mouth 3 (three) times daily. (Patient taking differently: Take 500 mg by mouth 2 (two) times daily with a meal.) 270 tablet 1   Na Sulfate-K Sulfate-Mg Sulf 17.5-3.13-1.6 GM/177ML SOLN Take 1 kit by mouth as directed. May use generic SUPREP;NO prior authorizations will be done.Please use Singlecare or GOOD-RX coupon. 354 mL 0   omeprazole (PRILOSEC) 40 MG capsule Take  1 capsule Daily to Prevent Indigestion & Heart burn                                  /                             TAKE                            BY                        MOUTH 90 capsule 3   rosuvastatin (CRESTOR) 10 MG tablet TAKE 1 TABLET BY MOUTH  DAILY FOR CHOLESTEROL (Patient taking differently: Take 10 mg by mouth at bedtime. TAKE 1 TABLET BY MOUTH  DAILY FOR CHOLESTEROL) 90 tablet 3   Tetrahydroz-Glyc-Hyprom-PEG (VISINE MAXIMUM REDNESS RELIEF) 0.05-0.2-0.36-1 % SOLN Place 1-2 drops into both eyes 3 (three) times daily as needed (dry/irrtated eyes.).      valsartan (DIOVAN) 160 MG tablet Take  1 tablet  Daily  for BP & Diabetic Kidney Protection  /                               /                    TAKE                    BY             MOUTH 90 tablet 3   valsartan-hydrochlorothiazide (DIOVAN-HCT) 320-25 MG tablet Take 1 tablet by mouth in the morning.     No current facility-administered medications on file prior to visit.   Allergies  Allergen Reactions   Cardura [Doxazosin Mesylate] Other (See Comments)    Nasal congestion   Codeine Other (  See Comments)    The patient passed out   Hytrin [Terazosin] Other (See Comments)    Reaction not recalled   Family History  Problem Relation Age of Onset   Hypertension Mother    GER disease Mother    Hyperlipidemia Father    Hypertension Father    Parkinson's disease Father    Hypertension Brother    Diabetes Maternal Grandmother    Stroke Paternal Grandmother    Diabetes Paternal Grandmother    Stroke Paternal Grandfather    Colon cancer Neg Hx    Colon polyps Neg Hx    Esophageal cancer Neg Hx    Stomach cancer Neg Hx    Rectal cancer Neg Hx    PE: There were no vitals taken for this visit. Wt Readings from Last 3 Encounters:  05/04/22 235 lb (106.6 kg)  03/29/22 223 lb 12.8 oz (101.5 kg)  03/23/22 222 lb (100.7 kg)   Constitutional: overweight, in NAD, + shuffling gait Eyes:EOMI, no exophthalmos ENT: no thyromegaly, no cervical lymphadenopathy Cardiovascular: RRR, No MRG Respiratory: CTA B Musculoskeletal: no deformities Skin: moist, warm, no rashes Neurological: + mild tremor with outstretched R hand  ASSESSMENT: 1. DM2, insulin-dependent, previously uncontrolled, with long-term complications - CKD stage 3 - DR  2.  Hyperlipidemia  3. Obesity class I  PLAN:  1. Patient with longstanding, previously uncontrolled type 2 diabetes, now with better control, on metformin, sulfonylurea, and GLP-1 receptor agonist.  At last visit, HbA1c was stable, at 6.8% and he had  another HbA1c obtained last month at the same level. -At last visit he was checking blood sugars approximately 3 times a week and they were occasionally higher, in the 200s and even with 1 value at 310.  We discussed about the importance of checking more frequently.  He was interested in a CGM but this was not covered for him due to the fact that he was not using insulin.  In the absence of more data, we did not change his regimen since HbA1c was better at that time. - at today's visit, upon questioning, he tells me that his PCP recently increased his Trulicity dose.  He continues on 4.5 mg weekly, which he tolerates well.  They are at goal in the morning and excellent before lunch but he is not checking usually later in the day and I did advise him to check some sugars at that time.  He checked once and this was higher than 200s.  For now, I did not suggest a change in regimen. - I suggested to:  Patient Instructions  Please continue: - Metformin 500 mg 2x a day - Glipizide 10 mg before b'fast and 10 mg dinner - Trulicity 4.5 mg weekly  Try CoQ10 for the muscle cramps.  Please return in 3-4 months with your sugar log.  - advised to check sugars at different times of the day - 1x a day, rotating check times - advised for yearly eye exams >> he is UTD - return to clinic in 3-4 months  2. HL -Reviewed latest lipid panel from 03/2022: LDL above target, triglycerides high, HDL low: Lab Results  Component Value Date   CHOL 174 03/29/2022   HDL 30 (L) 03/29/2022   LDLCALC 107 (H) 03/29/2022   TRIG 241 (H) 03/29/2022   CHOLHDL 5.8 (H) 03/29/2022  -He is continues on Crestor 10 mg daily and fenofibrate 160 mg daily.  He describes muscle aches for which I advised him to take co-Q10.  3.  Obesity class I -He continues on Trulicity which should also help with weight loss -He lost 4 pounds before last visit; however, he gained 12 pounds since last visit  Philemon Kingdom, MD PhD [facility]  Endocrinology

## 2022-05-07 ENCOUNTER — Encounter (HOSPITAL_COMMUNITY): Payer: Self-pay | Admitting: Gastroenterology

## 2022-05-09 ENCOUNTER — Encounter: Payer: Self-pay | Admitting: Gastroenterology

## 2022-06-02 ENCOUNTER — Other Ambulatory Visit: Payer: Self-pay | Admitting: Internal Medicine

## 2022-06-02 DIAGNOSIS — N183 Chronic kidney disease, stage 3 unspecified: Secondary | ICD-10-CM

## 2022-06-15 ENCOUNTER — Encounter: Payer: Self-pay | Admitting: Internal Medicine

## 2022-06-15 ENCOUNTER — Ambulatory Visit: Payer: Medicare Other | Admitting: Nurse Practitioner

## 2022-07-04 NOTE — Progress Notes (Unsigned)
MEDICARE ANNUAL WELLNESS VISIT AND FOLLOW UP Assessment:   Medicare annual wellness visit Due Annually Health Maintenance Reviewed Healthy lifestyle reviewed and goals set   Essential hypertension -typically fairly controlled recently, mild elevation today, recheck on Monday by [place] with labs -cont meds -dash diet -monitor at home  OSA (obstructive sleep apnea) -sleep hygiene -lose weight -Continue to follow with Dr. Brett Fairy, getting new CPAP  Type II diabetes mellitus with stage 3b CKD Blood sugars improved , fasting 80-100 On metformin 500 mg QD; glipizide 5 mg BID, Trulicity 1.5 mg SQ QW He plans to work aggressively on diet -cont diet and exercise - Hemoglobin A1c  BPH/Prostatism -cont meds -currently without change from prior visit -followed by urology   Hyperlipidemia associated with Type 2 Diabetes Mellitus(HCC) -cont meds -diet and exercise - Lipid panel - CMP   Idiopathic gout, unspecified chronicity, unspecified site Gout- recheck Uric acid as needed, doing well off of allopurinol Diet discussed   Vitamin D deficiency -cont supplement, suggested he continue current dose but to take at least 4 hours apart from PPI, defer recheck to next visit - goal 60-100 Defer Vitamin  D   Medication management - CBC with Differential/Platelet - CMP/GFR  Seizures Controlled on Keppra 55m BID No recent seizure activity, continue to monitor Continue to follow with Dr SLeonie Man Obesity - BMI 30-34 Long discussion about weight loss, diet, and exercise Recommended diet heavy in fruits and veggies and low in animal meats, cheeses, and dairy products, appropriate calorie intake Discussed appropriate weight for height  Follow up at next visit   Abnormal Thyroid Blood Test TSH  Anxiety Currently on Celexa 20 mg daily Stress management techniques discussed, increase water, good sleep hygiene discussed, increase exercise, and increase veggies.   Cerebral  Meningioma Continue to follow with Dr EEllene Routeand MRI  Vitamin D deficiency Continue Vit D supplementation to maintain value in therapeutic level of 60-100   Medication Management Continued  Over 30 minutes of exam, counseling, chart review, and critical decision making was performed Future Appointments  Date Time Provider [place] 07/05/2022 10:30 AM WAlycia Rossetti NP [facility] None  09/04/2022  1:20 PM GPhilemon Kingdom MD [facility] None  10/09/2022  9:30 AM MUnk Pinto MD [facility] None  11/07/2022  2:15 PM MFrann Rider NP [facility] None  03/30/2023 10:00 AM MUnk Pinto MD [facility] None    Plan:   During the course of the visit the patient was educated and counseled about appropriate screening and preventive services including:   Pneumococcal vaccine  Influenza vaccine Prevnar 13 Td vaccine Screening electrocardiogram Colorectal cancer screening Diabetes screening Glaucoma screening Nutrition counseling    Subjective:  Jeffery CANNEDYis a 76y.o. male who presents for Medicare Annual Wellness Visit and 3 month follow up for HTN, hyperlipidemia, T2 diabetes, and vitamin D Def.   he has a diagnosis of anxiety and is currently taking Celexa 20 mg QD. Symptoms are well controlled with medication   He has BPH/prostatism followed by urology.   BMI is There is no height or weight on file to calculate BMI., he admits exercise has been limited, not eating as well since mother passed.  Wt Readings from Last 3 Encounters:  05/05/22 215 lb 3.2 oz (97.6 kg)  05/04/22 235 lb (106.6 kg)  03/29/22 223 lb 12.8 oz (101.5 kg)   His blood pressure has been controlled at home, today their BP is   BP Readings from Last 3 Encounters:  05/05/22 (Marland Kitchen  110/58  05/04/22 134/73  03/29/22 138/76    He does not workout. He denies chest pain, shortness of breath, dizziness.   He is on cholesterol medication (rosuvastatin 10 mg daily) and denies myalgias. His  cholesterol is not at goal. The cholesterol last visit was:   Lab Results  Component Value Date   CHOL 174 03/29/2022   HDL 30 (L) 03/29/2022   LDLCALC 107 (H) 03/29/2022   TRIG 241 (H) 03/29/2022   CHOLHDL 5.8 (H) 03/29/2022   He has been working on diet and exercise for diabetes with CKD, and denies foot ulcerations, hyperglycemia, hypoglycemia , increased appetite, nausea, paresthesia of the feet, polydipsia, polyuria, visual disturbances, vomiting and weight loss. He checks random fasting glucoses; has been running 200-250 since his mother passed for the past month, currently on metformin 500 mg with dinner and glipizide 5 mg 2 tabs twice daily. Last A1C in the office was:   Lab Results  Component Value Date   HGBA1C 6.8 (H) 03/29/2022   Last GFR Lab Results  Component Value Date   GFRNONAA 37 (L) 10/25/2020    Patient is on Vitamin D supplement, taking 10000 IU, taking in AM with PPI Lab Results  Component Value Date   VD25OH 51 03/29/2022       Medication Review: Current Outpatient Medications on File Prior to Visit  Medication Sig Dispense Refill   acetaminophen (TYLENOL) 650 MG CR tablet Take 650 mg by mouth every 8 (eight) hours as needed for pain (or headaches).     Cholecalciferol (VITAMIN D3) 10000 units capsule Take 10,000 Units by mouth in the morning.     citalopram (CELEXA) 40 MG tablet Take  1 tablet  Daily  for Mood & Chronic Anxiety 90 tablet 3   Dulaglutide (TRULICITY) 4.5 GY/6.5LD SOPN Inject  4.5 mg  into Skin  every 7 days  for Diabetes  (Dx: e11.29 ) (Patient taking differently: Inject 4.5 mg into the skin every Saturday. Inject  4.5 mg  into Skin  every 7 days  for Diabetes  (Dx: e11.29 )) 6 mL 3   fenofibrate 160 MG tablet Take 1 tablet  Daily for Triglycerides (Blood Fats)                                     /                      TAKE                            BY                        MOUTH (Patient taking differently: Take 160 mg by mouth every  evening.) 90 tablet 3   fluticasone (FLONASE) 50 MCG/ACT nasal spray Place 1-2 sprays into both nostrils daily as needed for allergies or rhinitis.     glipiZIDE (GLUCOTROL) 5 MG tablet Take  1 to 2 tablets  3 x /day  with Meals  for Diabetes                                                               /  TAKE                                    BY                                    MOUTH 540 tablet 3   glucose blood (FREESTYLE LITE) test strip Check blood sugar 1 time daily-DX-E11.22 100 each 5   levETIRAcetam (KEPPRA) 500 MG tablet Take 1 tablet (500 mg total) by mouth 2 (two) times daily. 180 tablet 3   metFORMIN (GLUCOPHAGE) 500 MG tablet Take 1 tablet (500 mg total) by mouth 2 (two) times daily with a meal. 180 tablet 3   Na Sulfate-K Sulfate-Mg Sulf 17.5-3.13-1.6 GM/177ML SOLN Take 1 kit by mouth as directed. May use generic SUPREP;NO prior authorizations will be done.Please use Singlecare or GOOD-RX coupon. 354 mL 0   omeprazole (PRILOSEC) 40 MG capsule Take  1 capsule Daily to Prevent Indigestion & Heart burn                                  /                             TAKE                            BY                        MOUTH 90 capsule 3   rosuvastatin (CRESTOR) 10 MG tablet TAKE 1 TABLET BY MOUTH  DAILY FOR CHOLESTEROL (Patient taking differently: Take 10 mg by mouth at bedtime. TAKE 1 TABLET BY MOUTH  DAILY FOR CHOLESTEROL) 90 tablet 3   Tetrahydroz-Glyc-Hyprom-PEG (VISINE MAXIMUM REDNESS RELIEF) 0.05-0.2-0.36-1 % SOLN Place 1-2 drops into both eyes 3 (three) times daily as needed (dry/irrtated eyes.).     valsartan (DIOVAN) 160 MG tablet Take  1 tablet  Daily  for BP & Diabetic Kidney Protection  /                               /                    TAKE                    BY             MOUTH 90 tablet 3   valsartan-hydrochlorothiazide (DIOVAN-HCT) 320-25 MG tablet Take 1 tablet by mouth in the morning.     No current facility-administered  medications on file prior to visit.    Current Problems (verified) Patient Active Problem List   Diagnosis Date Noted   Personal history of colonic polyps    Benign neoplasm of cecum    Meningioma, cerebral ([place]) 03/09/2022   At risk for central sleep apnea 06/10/2021   Loud snoring 03/08/2021   History of sleep apnea 03/08/2021   Hx of resection of meningioma 03/08/2021   Retrognathia 03/08/2021   Seizure-like activity ([place]) 10/15/2020   Acute-on-chronic kidney injury ([place]) 10/15/2020   Seizures ([place]) 10/14/2020  Renal mass 01/08/2020   Noncompliance with CPAP treatment    Stage 3 chronic kidney disease (HCC)    Steroid-induced hyperglycemia    Enlarged prostate    CKD stage 3 due to type 2 diabetes mellitus ([place]) 03/12/2019   Anxiety 02/18/2018   GERD (gastroesophageal reflux disease) 03/12/2016   Obesity (BMI 30.0-34.9) 06/01/2015   Medication management 08/19/2014   BPH/Prostatism 08/19/2014   Essential hypertension 08/17/2013   Type 2 diabetes mellitus with stage 3 chronic kidney disease, without long-term current use of insulin ([place]) 08/17/2013   Vitamin D deficiency 08/17/2013   Hyperlipidemia associated with type 2 diabetes mellitus (HCC)    Testosterone deficiency    Gout    OSA (obstructive sleep apnea)     Screening Tests Immunization History  Administered Date(s) Administered   DT (Pediatric) 06/01/2015   Fluad Quad(high Dose 65+) 05/20/2020   Influenza Whole 06/05/2013   Influenza, High Dose Seasonal PF 06/05/2014, 06/01/2015, 06/14/2016, 06/22/2017, 06/11/2018, 05/13/2019, 05/17/2021   PFIZER(Purple Top)SARS-COV-2 Vaccination 09/27/2019, 10/20/2019   Pneumococcal Conjugate-13 08/19/2014   Pneumococcal Polysaccharide-23 08/22/2003, 12/10/2015   Tdap 03/08/2004   Zoster, Live 08/18/2013   Preventative care: Last colonoscopy: 01/2019, 3 polyps removed, due 01/2022 Ct head 2010 CXR 2010  Prior vaccinations: TD or Tdap: 2016  Influenza:  2022  Pneumococcal: 2017 Prevnar13: 2015 Shingles/Zostavax: 2014  Names of Other Physician/Practitioners you currently use: 1. [facility] here for primary care 2. [facility], eye doctor, last visit 09/10/2020 Patient Care Team: Unk Pinto, MD as PCP - General (Internal Medicine) Franchot Gallo, MD as Consulting Physician (Urology) Jamal Maes, MD as Consulting Physician (Nephrology) Rush Landmark, [facility] (Inactive) as Pharmacist (Pharmacist) Frann Rider, NP as Nurse Practitioner (Neurology) Garvin Fila, MD as Referring Physician (Neurology)  Allergies Allergies  Allergen Reactions   Cardura [Doxazosin Mesylate] Other (See Comments)    Nasal congestion   Codeine Other (See Comments)    The patient passed out   Hytrin [Terazosin] Other (See Comments)    Reaction not recalled    SURGICAL HISTORY He  has a past surgical history that includes Foot surgery (10/2009); Tonsillectomy (1953); Myringotomy (Left, 02/2005); Colonoscopy (2020); Craniotomy (Right, 12/29/2019); Colonoscopy with propofol (N/A, 05/04/2022); and polypectomy (05/04/2022). FAMILY HISTORY His family history includes Diabetes in his maternal grandmother and paternal grandmother; GER disease in his mother; Hyperlipidemia in his father; Hypertension in his brother, father, and mother; Parkinson's disease in his father; Stroke in his paternal grandfather and paternal grandmother. SOCIAL HISTORY He  reports that he has never smoked. He has never used smokeless tobacco. He reports that he does not drink alcohol and does not use drugs.  MEDICARE WELLNESS OBJECTIVES: Physical activity:   Cardiac risk factors:   Depression/mood screen:      06/15/2021   12:04 PM  Depression screen PHQ 2/9  Decreased Interest 0  Down, Depressed, Hopeless 0  PHQ - 2 Score 0    ADLs:      No data to display            Cognitive Testing  Alert? Yes  Normal  Appearance?Yes  Oriented to person? Yes  Place? Yes   Time? Yes  Recall of three objects?  Yes  Can perform simple calculations? Yes  Displays appropriate judgment?Yes  Can read the correct time from a watch face?Yes  EOL planning:   Review of Systems  Constitutional:  Negative for chills, fever and weight loss.  HENT:  Negative for congestion and hearing  loss.   Eyes:  Negative for blurred vision and double vision.  Respiratory:  Negative for cough and shortness of breath.   Cardiovascular:  Negative for chest pain, palpitations, orthopnea and leg swelling.  Gastrointestinal:  Negative for abdominal pain, constipation, diarrhea, heartburn, nausea and vomiting.  Genitourinary:  Negative for dysuria.  Musculoskeletal:  Negative for falls, joint pain and myalgias.  Skin:  Negative for rash.  Neurological:  Negative for dizziness, tingling, tremors, loss of consciousness and headaches.  Endo/Heme/Allergies:  Does not bruise/bleed easily.  Psychiatric/Behavioral:  Negative for depression, memory loss and suicidal ideas.      Objective:   There were no vitals filed for this visit.   There is no height or weight on file to calculate BMI.  Physical Exam Constitutional:      Appearance: Normal appearance. He is obese.  HENT:     Head: Normocephalic.     Right Ear: Tympanic membrane normal.     Left Ear: Tympanic membrane normal.     Mouth/Throat:     Mouth: Mucous membranes are dry.     Pharynx: No oropharyngeal exudate.  Eyes:     Extraocular Movements: Extraocular movements intact.     Pupils: Pupils are equal, round, and reactive to light.  Cardiovascular:     Rate and Rhythm: Normal rate and regular rhythm.     Pulses: Normal pulses.     Heart sounds: Normal heart sounds.  Pulmonary:     Effort: Pulmonary effort is normal.     Breath sounds: Normal breath sounds.  Abdominal:     General: Bowel sounds are normal.     Palpations: Abdomen is soft.  Musculoskeletal:         General: Normal range of motion.     Cervical back: Normal range of motion and neck supple.  Skin:    General: Skin is warm and dry.  Neurological:     General: No focal deficit present.     Mental Status: He is alert and oriented to person, place, and time.  Psychiatric:        Mood and Affect: Mood normal.        Behavior: Behavior normal.        Thought Content: Thought content normal.        Judgment: Judgment normal.      Medicare Attestation I have personally reviewed: The patient's medical and social history Their use of alcohol, tobacco or illicit drugs Their current medications and supplements The patient's functional ability including ADLs,fall risks, home safety risks, cognitive, and hearing and visual impairment Diet and physical activities Evidence for depression or mood disorders  The patient's weight, height, BMI, and visual acuity have been recorded in the chart.  I have made referrals, counseling, and provided education to the patient based on review of the above and I have provided the patient with a written personalized care plan for preventive services.     Alycia Rossetti, NP   07/04/2022

## 2022-07-05 ENCOUNTER — Ambulatory Visit (INDEPENDENT_AMBULATORY_CARE_PROVIDER_SITE_OTHER): Payer: Medicare Other | Admitting: Nurse Practitioner

## 2022-07-05 ENCOUNTER — Encounter: Payer: Self-pay | Admitting: Nurse Practitioner

## 2022-07-05 VITALS — BP 124/62 | HR 66 | Temp 97.2°F | Ht 70.0 in | Wt 217.2 lb

## 2022-07-05 DIAGNOSIS — N401 Enlarged prostate with lower urinary tract symptoms: Secondary | ICD-10-CM | POA: Diagnosis not present

## 2022-07-05 DIAGNOSIS — G4733 Obstructive sleep apnea (adult) (pediatric): Secondary | ICD-10-CM | POA: Diagnosis not present

## 2022-07-05 DIAGNOSIS — E669 Obesity, unspecified: Secondary | ICD-10-CM | POA: Diagnosis not present

## 2022-07-05 DIAGNOSIS — E1169 Type 2 diabetes mellitus with other specified complication: Secondary | ICD-10-CM

## 2022-07-05 DIAGNOSIS — E1122 Type 2 diabetes mellitus with diabetic chronic kidney disease: Secondary | ICD-10-CM | POA: Diagnosis not present

## 2022-07-05 DIAGNOSIS — R6889 Other general symptoms and signs: Secondary | ICD-10-CM

## 2022-07-05 DIAGNOSIS — R569 Unspecified convulsions: Secondary | ICD-10-CM | POA: Diagnosis not present

## 2022-07-05 DIAGNOSIS — F419 Anxiety disorder, unspecified: Secondary | ICD-10-CM | POA: Diagnosis not present

## 2022-07-05 DIAGNOSIS — E559 Vitamin D deficiency, unspecified: Secondary | ICD-10-CM | POA: Diagnosis not present

## 2022-07-05 DIAGNOSIS — N138 Other obstructive and reflux uropathy: Secondary | ICD-10-CM

## 2022-07-05 DIAGNOSIS — E785 Hyperlipidemia, unspecified: Secondary | ICD-10-CM

## 2022-07-05 DIAGNOSIS — N1832 Chronic kidney disease, stage 3b: Secondary | ICD-10-CM | POA: Diagnosis not present

## 2022-07-05 DIAGNOSIS — Z23 Encounter for immunization: Secondary | ICD-10-CM

## 2022-07-05 DIAGNOSIS — Z0001 Encounter for general adult medical examination with abnormal findings: Secondary | ICD-10-CM

## 2022-07-05 DIAGNOSIS — I10 Essential (primary) hypertension: Secondary | ICD-10-CM | POA: Diagnosis not present

## 2022-07-05 DIAGNOSIS — Z79899 Other long term (current) drug therapy: Secondary | ICD-10-CM | POA: Diagnosis not present

## 2022-07-05 DIAGNOSIS — M1A00X0 Idiopathic chronic gout, unspecified site, without tophus (tophi): Secondary | ICD-10-CM

## 2022-07-05 DIAGNOSIS — D320 Benign neoplasm of cerebral meninges: Secondary | ICD-10-CM

## 2022-07-05 DIAGNOSIS — N183 Chronic kidney disease, stage 3 unspecified: Secondary | ICD-10-CM | POA: Diagnosis not present

## 2022-07-05 NOTE — Patient Instructions (Signed)

## 2022-07-06 LAB — COMPLETE METABOLIC PANEL WITH GFR
AG Ratio: 1.7 (calc) (ref 1.0–2.5)
ALT: 14 U/L (ref 9–46)
AST: 20 U/L (ref 10–35)
Albumin: 4.4 g/dL (ref 3.6–5.1)
Alkaline phosphatase (APISO): 44 U/L (ref 35–144)
BUN/Creatinine Ratio: 14 (calc) (ref 6–22)
BUN: 28 mg/dL — ABNORMAL HIGH (ref 7–25)
CO2: 28 mmol/L (ref 20–32)
Calcium: 9.8 mg/dL (ref 8.6–10.3)
Chloride: 105 mmol/L (ref 98–110)
Creat: 2.03 mg/dL — ABNORMAL HIGH (ref 0.70–1.28)
Globulin: 2.6 g/dL (calc) (ref 1.9–3.7)
Glucose, Bld: 69 mg/dL (ref 65–99)
Potassium: 4.1 mmol/L (ref 3.5–5.3)
Sodium: 141 mmol/L (ref 135–146)
Total Bilirubin: 0.5 mg/dL (ref 0.2–1.2)
Total Protein: 7 g/dL (ref 6.1–8.1)
eGFR: 34 mL/min/{1.73_m2} — ABNORMAL LOW (ref >=60)

## 2022-07-06 LAB — CBC WITH DIFFERENTIAL/PLATELET
Absolute Monocytes: 593 cells/uL (ref 200–950)
Basophils Absolute: 41 cells/uL (ref 0–200)
Basophils Relative: 0.6 %
Eosinophils Absolute: 248 cells/uL (ref 15–500)
Eosinophils Relative: 3.6 %
HCT: 38.3 % — ABNORMAL LOW (ref 38.5–50.0)
Hemoglobin: 13.2 g/dL (ref 13.2–17.1)
Lymphs Abs: 1677 cells/uL (ref 850–3900)
MCH: 30.3 pg (ref 27.0–33.0)
MCHC: 34.5 g/dL (ref 32.0–36.0)
MCV: 87.8 fL (ref 80.0–100.0)
MPV: 10.5 fL (ref 7.5–12.5)
Monocytes Relative: 8.6 %
Neutro Abs: 4340 cells/uL (ref 1500–7800)
Neutrophils Relative %: 62.9 %
Platelets: 179 10*3/uL (ref 140–400)
RBC: 4.36 10*6/uL (ref 4.20–5.80)
RDW: 13.3 % (ref 11.0–15.0)
Total Lymphocyte: 24.3 %
WBC: 6.9 10*3/uL (ref 3.8–10.8)

## 2022-07-06 LAB — LIPID PANEL
Cholesterol: 149 mg/dL (ref <200)
HDL: 29 mg/dL — ABNORMAL LOW (ref >=40)
LDL Cholesterol (Calc): 92 mg/dL (calc)
Non-HDL Cholesterol (Calc): 120 mg/dL (calc) (ref <130)
Total CHOL/HDL Ratio: 5.1 (calc) — ABNORMAL HIGH (ref <5.0)
Triglycerides: 183 mg/dL — ABNORMAL HIGH (ref <150)

## 2022-07-06 LAB — HEMOGLOBIN A1C
Hgb A1c MFr Bld: 6.3 % of total Hgb — ABNORMAL HIGH (ref <5.7)
Mean Plasma Glucose: 134 mg/dL
eAG (mmol/L): 7.4 mmol/L

## 2022-07-06 LAB — TSH: TSH: 2.02 mIU/L (ref 0.40–4.50)

## 2022-07-24 ENCOUNTER — Other Ambulatory Visit: Payer: Self-pay | Admitting: Internal Medicine

## 2022-07-24 DIAGNOSIS — E1122 Type 2 diabetes mellitus with diabetic chronic kidney disease: Secondary | ICD-10-CM

## 2022-08-04 DIAGNOSIS — H90A32 Mixed conductive and sensorineural hearing loss, unilateral, left ear with restricted hearing on the contralateral side: Secondary | ICD-10-CM | POA: Diagnosis not present

## 2022-08-04 DIAGNOSIS — H6983 Other specified disorders of Eustachian tube, bilateral: Secondary | ICD-10-CM | POA: Diagnosis not present

## 2022-09-04 ENCOUNTER — Ambulatory Visit: Payer: Medicare Other | Admitting: Internal Medicine

## 2022-09-11 ENCOUNTER — Other Ambulatory Visit: Payer: Self-pay

## 2022-09-11 DIAGNOSIS — E782 Mixed hyperlipidemia: Secondary | ICD-10-CM

## 2022-09-11 MED ORDER — ROSUVASTATIN CALCIUM 10 MG PO TABS: ORAL_TABLET | ORAL | 3 refills | Status: DC

## 2022-09-29 DIAGNOSIS — H90A32 Mixed conductive and sensorineural hearing loss, unilateral, left ear with restricted hearing on the contralateral side: Secondary | ICD-10-CM | POA: Diagnosis not present

## 2022-09-29 DIAGNOSIS — H6982 Other specified disorders of Eustachian tube, left ear: Secondary | ICD-10-CM | POA: Diagnosis not present

## 2022-10-02 ENCOUNTER — Other Ambulatory Visit: Payer: Self-pay

## 2022-10-02 MED ORDER — CIPROFLOXACIN-DEXAMETHASONE 0.3-0.1 % OT SUSP
4.0000 [drp] | Freq: Two times a day (BID) | OTIC | 0 refills | Status: DC
  Filled 2022-10-02 – 2022-10-09 (×3): qty 7.5, 19d supply, fill #0

## 2022-10-05 ENCOUNTER — Other Ambulatory Visit: Payer: Self-pay

## 2022-10-05 ENCOUNTER — Encounter: Payer: Self-pay | Admitting: Otolaryngology

## 2022-10-09 ENCOUNTER — Ambulatory Visit: Payer: BLUE CROSS/BLUE SHIELD | Admitting: Internal Medicine

## 2022-10-09 ENCOUNTER — Other Ambulatory Visit: Payer: Self-pay

## 2022-10-09 ENCOUNTER — Encounter: Payer: Self-pay | Admitting: Internal Medicine

## 2022-10-09 MED ORDER — OFLOXACIN 0.3 % OT SOLN
4.0000 [drp] | Freq: Two times a day (BID) | OTIC | 0 refills | Status: DC
  Filled 2022-10-09: qty 10, 25d supply, fill #0

## 2022-10-09 NOTE — Discharge Instructions (Signed)
[facility] DISCHARGE INSTRUCTIONS FOR MYRINGOTOMY AND TUBE INSERTION  [facility] Carloyn Manner, M.D.   Diet:   After surgery, the patient should take only liquids and foods as tolerated.  The patient may then have a regular diet after the effects of anesthesia have worn off, usually about four to six hours after surgery.  Activities:   The patient should rest until the effects of anesthesia have worn off.  After this, there are no restrictions on the normal daily activities.  Medications:   You will be given a prescription for antibiotic drops to be used in the ears postoperatively.  It is recommended to use 4 drops 2 times a day for 4 days, then the drops should be saved for possible future use.  The tubes should not cause any discomfort to the patient, but if there is any question, Tylenol should be given according to the instructions for the age of the patient.  Other medications should be continued normally.  Precautions:   Should there be recurrent drainage after the tubes are placed, the drops should be used for approximately 3-4 days.  If it does not clear, you should call the ENT office.  Earplugs:   Earplugs are only needed for those who are going to be submerged under water.  When taking a bath or shower and using a cup or showerhead to rinse hair, it is not necessary to wear earplugs.  These come in a variety of fashions, all of which can be obtained at our office.  However, if one is not able to come by the office, then silicone plugs can be found at most pharmacies.  It is not advised to stick anything in the ear that is not approved as an earplug.  Silly putty is not to be used as an earplug.  Swimming is allowed in patients after ear tubes are inserted, however, they must wear earplugs if they are going to be submerged under water.  For those children who are going to be swimming a lot, it is recommended to use a fitted ear mold, which can be  made by our audiologist.  If discharge is noticed from the ears, this most likely represents an ear infection.  We would recommend getting your eardrops and using them as indicated above.  If it does not clear, then you should call the ENT office.  For follow up, the patient should return to the ENT office three weeks postoperatively and then every six months as required by the doctor.

## 2022-10-09 NOTE — Progress Notes (Signed)
C  A  N  C  E  L  L  E D  Took   sick    brother    to    Doctor                                                                                                                                                                                                                                                                                     Future Appointments  Date Time Provider Department  09/14/2021    6 mo  11:30 AM Unk Pinto, MD [facility]  10/04/2021 11:00 AM Newton Pigg, [facility]  11/02/2021  2:45 PM Frann Rider, NP [facility]  06/15/2022       Wellness 11:00 AM Magda Bernheim, NP [facility]    History of Present Illness:       This very nice 77 y.o. MWM presents for 6  month follow up with HTN, HLD, T2_NIDDM  and Vitamin D Deficiency.  In May 2020, patient had craniotomy for excision of a Frontal lobe meningioma and has been on Keppra since. Patient is followed by Dr Brett Fairy on CPAP for OSA with improved restorative sleep.        Patient is treated for HTN (1992) & BP has been controlled at home. Today's BP was initially elevated & rechecked at goal :. Patient has had no complaints of any cardiac type chest pain, palpitations, dyspnea / orthopnea / PND, dizziness, claudication, or dependent edema.       Hyperlipidemia is controlled with diet & meds. Patient denies myalgias or other med SE's. Last Lipids were at goal except elevated Trig's :   Lab Results  Component Value Date   CHOL 156 06/15/2021   HDL 31 (L) 06/15/2021   LDLCALC 96 06/15/2021   TRIG 202 (H) 06/15/2021   CHOLHDL 5.0 (H) 06/15/2021    Also, the patient has history of  PreDiabetes (2008) and then T2_NIDDM  (2014) & started  Insulin in 2021 after craniotomy for a meningioma.  As he improved , he tapered off of insulin and back on Metformin & Glucotrol. Marland Kitchen  He does  have CKD 3b  w /(GFR 38) . and has had no symptoms of reactive hypoglycemia, diabetic polys, paresthesias or visual blurring.  Last A1c was not at goal:  Lab Results  Component Value Date   HGBA1C 6.5 (H) 06/15/2021                                                       Further, the patient also has history of Vitamin D Deficiency ("22" / 2008) and supplements vitamin D without any suspected side-effects. Last vitamin D was at goal :  Lab Results  Component Value Date   VD25OH 72 03/03/2021     Current Outpatient Medications on File Prior to Visit  Medication Sig   acetaminophen ) 650 MG CR tablet Take 650 mg every 8 hours as needed for pain    VITAMIN 5,000 units  Taking 5,000 IU QOD, alternating with 10,000   citalopram  20 MG tablet TAKE 1 TABLET BY MOUTH DAILY FOR MOOD   Dulaglutide (TRULICITY) 3 0000000 SOPN Inject 3 mg as directed once a week.   fenofibrate 160 MG tablet TAKE 1 TABLET  DAILY    fexofenadine  180 MG tablet Take  daily as needed    FLONASE nasal spray Place 2 sprays into both nostrils daily as needed    glipiZIDE  5 MG tablet TAKE 1 TO 2 TABS  3 TIMES DAILY    levETIRAcetam (KEPPRA) 500 MG tablet Take 1 tablet 2 times daily.   metFORMIN 500 MG tablet Take 1 tablet daily with supper.   mupirocin oint 2 % APPLY TWICE A DAY TO SKIN INFECTION.   omeprazole 40 MG capsule TAKE 1 CAPSULE  DAILY    pyridOXINE (B-6) 50 MG tablet Take 1 tablet  daily.   rosuvastatin 10 MG tablet Take  1 tablet  Daily     valsartan  160 MG tablet Take  1 tablet  Daily     Allergies  Allergen Reactions   Cardura [Doxazosin Mesylate] Nasal congestion   Codeine The patient passed out   Hytrin [Terazosin] Reaction not recalled         PMHx:   Past Medical History:  Diagnosis Date   Allergy    Anxiety    Cataract    beginning stage 01/22/2019    Chronic kidney disease    enlarge bladder   Diabetes mellitus without complication (HCC)    GERD (gastroesophageal reflux disease)    Gout    Hyperlipidemia    Hypertension    OSA (obstructive sleep apnea)    Pre-diabetes    Seizures ([place])    Sleep apnea    c pap     Immunization History  Administered Date(s) Administered   DT  06/01/2015   Fluad Quad(high Dose ) 05/20/2020   Influenza 06/05/2013   Influenza, High Dose  06/22/2017, 06/11/2018, 05/13/2019, 05/17/2021   PFIZER SARS-COV-2 Vacc 09/27/2019, 10/20/2019   Pneumococcal -13 08/19/2014   Pneumococcal -23 08/22/2003, 12/10/2015   Tdap 03/08/2004   Zoster, Live 08/18/2013     Past Surgical History:  Procedure Laterality Date   COLONOSCOPY     CRANIOTOMY Right 12/29/2019   Craniotomy for Tumor;  Kristeen Miss, MD   FOOT SURGERY  10/2009   MYRINGOTOMY Left 02-2005   [place]  FHx:    Reviewed / unchanged  SHx:    Reviewed / unchanged   Systems Review:  Constitutional: Denies fever, chills, wt changes, headaches, insomnia, fatigue, night sweats, change in appetite. Eyes: Denies redness, blurred vision, diplopia, discharge, itchy, watery eyes.  ENT: Denies discharge, congestion, post nasal drip, epistaxis, sore throat, earache, hearing loss, dental pain, tinnitus, vertigo, sinus pain, snoring.  CV: Denies chest pain, palpitations, irregular heartbeat, syncope, dyspnea, diaphoresis, orthopnea, PND, claudication or edema. Respiratory: denies cough, dyspnea, DOE, pleurisy, hoarseness, laryngitis, wheezing.  Gastrointestinal: Denies dysphagia, odynophagia, heartburn, reflux, water brash, abdominal pain or cramps, nausea, vomiting, bloating, diarrhea, constipation, hematemesis, melena, hematochezia  or hemorrhoids. Genitourinary: Denies dysuria, frequency, urgency, nocturia, hesitancy, discharge, hematuria or flank pain. Musculoskeletal: Denies arthralgias, myalgias, stiffness, jt. swelling,  pain, limping or strain/sprain.  Skin: Denies pruritus, rash, hives, warts, acne, eczema or change in skin lesion(s). Neuro: No weakness, tremor, incoordination, spasms, paresthesia or pain. Psychiatric: Denies confusion, memory loss or sensory loss. Endo: Denies change in weight, skin or hair change.  Heme/Lymph: No excessive bleeding, bruising or enlarged lymph nodes.  Physical Exam  There were no vitals taken for this visit.  Appears  over nourished, well groomed  and in no distress.  Eyes: PERRLA, EOMs, conjunctiva no swelling or erythema. Sinuses: No frontal/maxillary tenderness ENT/Mouth: EAC's clear, TM's nl w/o erythema, bulging. Nares clear w/o erythema, swelling, exudates. Oropharynx clear without erythema or exudates. Oral hygiene is good. Tongue normal, non obstructing. Hearing intact.  Neck: Supple. Thyroid not palpable. Car 2+/2+ without bruits, nodes or JVD. Chest: Respirations nl with BS clear & equal w/o rales, rhonchi, wheezing or stridor.  Cor: Heart sounds normal w/ regular rate and rhythm without sig. murmurs, gallops, clicks or rubs. Peripheral pulses normal and equal  without edema.  Abdomen: Soft & bowel sounds normal. Non-tender w/o guarding, rebound, hernias, masses or organomegaly.  Lymphatics: Unremarkable.  Musculoskeletal: Full ROM all peripheral extremities, joint stability, 5/5 strength and normal gait.  Skin: Warm, dry without exposed rashes, lesions or ecchymosis apparent.  Neuro: Cranial nerves intact, reflexes equal bilaterally. Sensory-motor testing grossly intact. Tendon reflexes grossly intact.  Pysch: Alert & oriented x 3.  Insight and judgement nl & appropriate. No ideations.  Assessment and Plan:  1. Essential hypertension  - Continue medication, monitor blood pressure at home.  - Continue DASH diet.  Reminder to go to the ER if any CP,  SOB, nausea, dizziness, severe HA, changes vision/speech.    - CBC with Differential/Platelet - COMPLETE  METABOLIC PANEL WITH GFR - Magnesium - TSH  2. Hyperlipidemia associated with type 2 diabetes mellitus ([place])  - Continue diet/meds, exercise,& lifestyle modifications.  - Continue monitor periodic cholesterol/liver & renal functions    - Lipid panel - TSH  3. Type 2 diabetes mellitus with stage 3b chronic kidney  disease, without long-term current use of insulin (HCC)  - Continue diet, exercise  - Lifestyle modifications.  - Monitor appropriate labs   - Hemoglobin A1c  4. Vitamin D deficiency  - Continue supplementation   - VITAMIN D 25 Hydroxy  5. Seizures (HCC)  - Levetiracetam, Immunoassay  6. OSA on CPAP   7. Medication management  - CBC with Differential/Platelet - COMPLETE METABOLIC PANEL WITH GFR - Magnesium - Lipid panel - TSH - Hemoglobin A1c - VITAMIN D 25 Hydroxy - Levetiracetam, Immunoassay        Discussed  regular exercise, BP monitoring, weight control to achieve/maintain BMI less than 25 and discussed med  and SE's. Recommended labs to assess and monitor clinical status with further disposition pending results of labs.  I discussed the assessment and treatment plan with the patient. The patient was provided an opportunity to ask questions and all were answered. The patient agreed with the plan and demonstrated an understanding of the instructions.  I provided over 30 minutes of exam, counseling, chart review and  complex critical decision making.       The patient was advised to call back or seek an in-person evaluation if the symptoms worsen or if the condition fails to improve as anticipated.   Kirtland Bouchard, MD    Comprehensive Evaluation & Examination  Future Appointments  Date Time Provider Department  03/29/2022                     cpe 10:00 AM Unk Pinto, MD [facility]  04/11/2022  1:30 PM Milus Banister, MD [facility]  05/05/2022  1:40 PM Philemon Kingdom, MD [facility]  07/05/2022                 wellness  10:30 AM  Alycia Rossetti, NP [facility]  11/07/2022  2:15 PM Frann Rider, NP [facility]  03/30/2023                     cpe 10:00 AM Unk Pinto, MD [facility]              This very nice 77 y.o. MWM presents for a comprehensive evaluation and management of multiple medical co-morbidities.  Patient has been followed for HTN, HLD, T2_NIDDM  /CKD3a  and Vitamin D Deficiency.   Patient also is on CPAP for OSA with improved sleep hygiene managed by Dr Brett Fairy .                                                    In  2021 patient underwent craniotomy for meningoma resection by Dr Ellene Route and post-op on Decadron flared his diabetes requiring insulin & patient was referred to Dr Cruzita Lederer for management.  Since then, patient has tapered off insulin & continues on Metformin, Glipizide and Trulicity.         HTN predates circa  1992. Patient's BP has been controlled at home.  Today's BP is  at goal - 138/76 . Patient denies any cardiac symptoms as chest pain, palpitations, shortness of breath, dizziness or ankle swelling.       Patient's hyperlipidemia is not controlled with diet and Rosuvastatin /Fenofibrate . Patient denies myalgias or other medication SE's. Last lipids were not at goal :  Lab Results  Component Value Date   CHOL 170 12/22/2021   HDL 29 (L) 12/22/2021   LDLCALC 109 (H) 12/22/2021   TRIG 203 (H) 12/22/2021   CHOLHDL 5.9 (H) 12/22/2021         Patient has hx/o  PreDiabetes  (2008) transitioning to T2_NIDDM circa 2014   and patient denies reactive hypoglycemic symptoms, visual blurring, diabetic polys or paresthesias.  Patient is OFF of Insulin & is on Trulicity, Glipizide & Metformin.  Patient admits "overeating despite Trulicity.   BMI = 32.11 - patient remains OVERWEIGHT.   Last A1c was not at goal :   Lab Results  Component Value Date   HGBA1C 6.8 (A) 01/23/2022   Wt Readings from  Last 3 Encounters:  07/05/22 217 lb 3.2 oz (98.5 kg)  05/05/22 215 lb 3.2 oz (97.6 kg)  05/04/22  235 lb (106.6 kg)         Finally, patient has history of Vitamin D Deficiency ("22" /2008) and last vitamin D was at goal :    Lab Results  Component Value Date   VD25OH 57 12/22/2021       Current Outpatient Medications on File Prior to Visit  Medication Sig   acetaminophen  650 MG CR tablet Take 650 mg every 8 hours as needed for pain   VITAMIN D3 10000 units capsule Take  at bedtime.    citalopram 20 MG tablet TAKE 1 TABLET DAILY     TRULICITY 3 0000000  Inject 3.0 mg weekly under skin   fenofibrate 160 MG tablet Take 1 tablet  Daily    fexofenadine 180 MG tablet Take daily as needed for allergies    FLONASE nasal spray Place 2 sprays into  nostrils daily as needed   glipiZIDE 5 MG tablet Takes 2 tablets 2 times daily before a meal.   levETIRAcetam (KEPPRA) 500 MG  Take 1 tablet  2  times daily.   metFORMIN  500 MG tablet Take 1 tablet daily with supper.   omeprazole 40 MG capsule Take 1 capsule Daily   polyethylene glycol  Take daily as needed    pyridOXINE (B-6) 50 MG tablet Take 1 tablet daily.   rosuvastatin 10 MG tablet Take  1 tablet  Daily     valsartan 160 MG tablet Take  1 tablet  Daily      Allergies  Allergen Reactions   Cardura [Doxazosin Mesylate]     Nasal congestion   Codeine     The patient passed out     Past Medical History:  Diagnosis Date   Allergy    Anxiety    Cataract    beginning stage 01/22/2019   Chronic kidney disease    enlarge bladder   Diabetes mellitus without complication (HCC)    GERD (gastroesophageal reflux disease)    Gout    Hyperlipidemia    Hypertension    OSA (obstructive sleep apnea)    Pre-diabetes    Seizures ([place])    Sleep apnea    c pap    Health Maintenance  Topic Date Due   Zoster Vaccines- Shingrix (1 of 2) Never done   COVID-19 Vaccine (3 - Pfizer risk series) 11/17/2019   FOOT EXAM  03/02/2021   INFLUENZA VACCINE  03/21/2021   HEMOGLOBIN A1C  07/21/2021   OPHTHALMOLOGY EXAM  12/30/2021    COLONOSCOPY ( 02/02/2022   TETANUS/TDAP  05/31/2025   Hepatitis C Screening  Completed   PNA vac Low Risk Adult  Completed   HPV VACCINES  Aged Out     Immunization History  Administered Date(s) Administered   DT  06/01/2015   Fluad Quad - high Dose 65+) 05/20/2020   Influenza Whole 06/05/2013   Influenza, High Dose  06/11/2018, 05/13/2019   PFIZER  SARS-COV-2 Vacc 09/27/2019, 10/20/2019   Pneumococcal -13 08/19/2014   Pneumococcal -23 08/22/2003, 12/10/2015   Tdap 03/08/2004   Zoster, Live 08/18/2013    Last Colon - 02/03/2019 - Dr Ardis Hughs - Recc 3 year f/u due Mar 2023 overdue  - 01/06/22 & 01/27/22 - Dr Ardis Hughs sent recall letters    - Patient has Colonoscopy scheduled 8/22 with Dr Ardis Hughs.    Past Surgical History:  Procedure Laterality Date  COLONOSCOPY     CRANIOTOMY Right 12/29/2019   Procedure: Craniotomy for Tumor; Kristeen Miss, MD   FOOT SURGERY  10/2009   MYRINGOTOMY Left 02-2005   Dr.Newman   TONSILLECTOMY  1953    Family History  Problem Relation Age of Onset   Hypertension Mother    GER disease Mother    Hyperlipidemia Father    Hypertension Father    Parkinson's disease Father    Hypertension Brother    Diabetes Maternal Grandmother    Stroke Paternal Grandmother    Diabetes Paternal Grandmother    Stroke Paternal Grandfather    Colon cancer Neg Hx    Colon polyps Neg Hx    Esophageal cancer Neg Hx    Stomach cancer Neg Hx    Rectal cancer Neg Hx     Social History   Socioeconomic History   Marital status: Married    Spouse name: Peggy   Number of children: 1 adopted daughter  Occupational History   Occupation: Recruitment consultant    Comment: on STD 10/25/20  Tobacco Use   Smoking status: Never   Smokeless tobacco: Never  Vaping Use   Vaping Use: Never used  Substance and Sexual Activity   Alcohol use: No   Drug use: No   Sexual activity: Not on file  Social History Narrative   Lives with wife   Right handed   Drinks 3-4 cups caffeine  daily    ROS Constitutional: Denies fever, chills, weight loss/gain, headaches, insomnia,  night sweats or change in appetite. Does c/o fatigue. Eyes: Denies redness, blurred vision, diplopia, discharge, itchy or watery eyes.  ENT: Denies discharge, congestion, post nasal drip, epistaxis, sore throat, earache, hearing loss, dental pain, Tinnitus, Vertigo, Sinus pain or snoring.  Cardio: Denies chest pain, palpitations, irregular heartbeat, syncope, dyspnea, diaphoresis, orthopnea, PND, claudication or edema Respiratory: denies cough, dyspnea, DOE, pleurisy, hoarseness, laryngitis or wheezing.  Gastrointestinal: Denies dysphagia, heartburn, reflux, water brash, pain, cramps, nausea, vomiting, bloating, diarrhea, constipation, hematemesis, melena, hematochezia, jaundice or hemorrhoids Genitourinary: Denies dysuria, frequency, urgency, nocturia, hesitancy, discharge, hematuria or flank pain Musculoskeletal: Denies arthralgia, myalgia, stiffness, Jt. Swelling, pain, limp or strain/sprain. Denies Falls. Skin: Denies puritis, rash, hives, warts, acne, eczema or change in skin lesion Neuro: No weakness, tremor, incoordination, spasms, paresthesia or pain Psychiatric: Denies confusion, memory loss or sensory loss. Denies Depression. Endocrine: Denies change in weight, skin, hair change, nocturia, and paresthesia, diabetic polys, visual blurring or hyper / hypo glycemic episodes.  Heme/Lymph: No excessive bleeding, bruising or enlarged lymph nodes.   Physical Exam  There were no vitals taken for this visit.  General Appearance:  Over nourished  and in no apparent distress.  Eyes: PERRLA, EOMs, conjunctiva no swelling or erythema, normal fundi and vessels. Sinuses: No frontal/maxillary tenderness ENT/Mouth: EACs patent / TMs  nl. Nares clear without erythema, swelling, mucoid exudates. Oral hygiene is good. No erythema, swelling, or exudate. Tongue normal, non-obstructing. Tonsils not swollen or  erythematous. Hearing normal.  Neck: Supple, thyroid not palpable. No bruits, nodes or JVD. Respiratory: Respiratory effort normal.  BS equal and clear bilateral without rales, rhonci, wheezing or stridor. Cardio: Heart sounds are normal with regular rate and rhythm and no murmurs, rubs or gallops. Peripheral pulses are normal and equal bilaterally without edema. No aortic or femoral bruits. Chest: symmetric with normal excursions and percussion.  Abdomen: Soft, with Nl bowel sounds. Nontender, no guarding, rebound, hernias, masses, or organomegaly.  Lymphatics: Non tender without lymphadenopathy.  Musculoskeletal: Full  ROM all peripheral extremities, joint stability, 5/5 strength, and normal gait. Skin: Warm and dry without rashes, lesions, cyanosis, clubbing or  ecchymosis.  Neuro: Cranial nerves intact, reflexes equal bilaterally. Normal muscle tone, no cerebellar symptoms. Sensation intact.  Pysch: Alert and oriented X 3 with normal affect, insight and judgment appropriate.   Assessment and Plan  1. Essential hypertension  - EKG 12-Lead - Urinalysis, Routine w reflex microscopic - Microalbumin / creatinine urine ratio - CBC with Differential/Platelet - COMPLETE METABOLIC PANEL WITH GFR - Magnesium - TSH  2. Hyperlipidemia associated with type 2 diabetes mellitus ([place])  - EKG 12-Lead - [place], RETROPERITNL ABD,  LTD - Lipid panel - TSH  3. Type 2 diabetes mellitus with stage 3a chronic kidney  disease, without long-term current use of insulin (HCC)  - EKG 12-Lead - [place], RETROPERITNL ABD,  LTD - HM DIABETES FOOT EXAM - LOW EXTREMITY NEUR EXAM DOCUM - Hemoglobin A1c - Insulin, random  4. Vitamin D deficiency  - VITAMIN D 25 Hydroxy  5. Seizures (HCC)  - Levetiracetam level  6. Prostate cancer screening   7. Screening for colorectal cancer  - POC Hemoccult Bld/Stl   8. Screening for ischemic heart disease  - EKG 12-Lead  9. FHx: heart disease  - EKG 12-Lead -  [place], RETROPERITNL ABD,  LTD  10. Screening for AAA (aortic abdominal aneurysm)  - [place], RETROPERITNL ABD,  LTD  11. OSA on CPAP   12. Medication management  - Urinalysis, Routine w reflex microscopic - Microalbumin / creatinine urine ratio - CBC with Differential/Platelet - COMPLETE METABOLIC PANEL WITH GFR - Magnesium - Lipid panel - TSH - Hemoglobin A1c - Insulin, random - VITAMIN D 25 Hydroxy          Patient was counseled in prudent diet, weight control to achieve/maintain BMI less than 25, BP monitoring, regular exercise and medications as discussed.  Discussed med effects and SE's. Routine screening labs and tests as requested with regular follow-up as recommended. Over 40 minutes of exam, counseling, chart review and high complex critical decision making was performed   Kirtland Bouchard, MD

## 2022-10-09 NOTE — Patient Instructions (Signed)

## 2022-10-11 ENCOUNTER — Other Ambulatory Visit: Payer: Self-pay

## 2022-10-11 ENCOUNTER — Ambulatory Visit: Payer: Medicare Other | Admitting: General Practice

## 2022-10-11 ENCOUNTER — Encounter
Admission: RE | Disposition: A | Discharge status: Home / Self Care | Payer: Self-pay | Source: Ambulatory Visit | Attending: Otolaryngology

## 2022-10-11 ENCOUNTER — Encounter: Payer: Self-pay | Admitting: Otolaryngology

## 2022-10-11 ENCOUNTER — Ambulatory Visit
Admission: RE | Admit: 2022-10-11 | Discharge: 2022-10-11 | Disposition: A | Discharge status: Home / Self Care | Payer: Medicare Other | Source: Ambulatory Visit | Attending: Otolaryngology | Admitting: Otolaryngology

## 2022-10-11 DIAGNOSIS — H6990 Unspecified Eustachian tube disorder, unspecified ear: Secondary | ICD-10-CM | POA: Insufficient documentation

## 2022-10-11 DIAGNOSIS — Z7984 Long term (current) use of oral hypoglycemic drugs: Secondary | ICD-10-CM | POA: Diagnosis not present

## 2022-10-11 DIAGNOSIS — K219 Gastro-esophageal reflux disease without esophagitis: Secondary | ICD-10-CM | POA: Diagnosis not present

## 2022-10-11 DIAGNOSIS — E1122 Type 2 diabetes mellitus with diabetic chronic kidney disease: Secondary | ICD-10-CM | POA: Diagnosis not present

## 2022-10-11 DIAGNOSIS — G4733 Obstructive sleep apnea (adult) (pediatric): Secondary | ICD-10-CM | POA: Diagnosis not present

## 2022-10-11 DIAGNOSIS — N183 Chronic kidney disease, stage 3 unspecified: Secondary | ICD-10-CM | POA: Insufficient documentation

## 2022-10-11 DIAGNOSIS — I129 Hypertensive chronic kidney disease with stage 1 through stage 4 chronic kidney disease, or unspecified chronic kidney disease: Secondary | ICD-10-CM | POA: Diagnosis not present

## 2022-10-11 DIAGNOSIS — H6982 Other specified disorders of Eustachian tube, left ear: Secondary | ICD-10-CM | POA: Diagnosis not present

## 2022-10-11 HISTORY — PX: MYRINGOTOMY WITH TUBE PLACEMENT: SHX5663

## 2022-10-11 HISTORY — DX: Frequency of micturition: R35.0

## 2022-10-11 HISTORY — DX: Unspecified hearing loss, unspecified ear: H91.90

## 2022-10-11 LAB — GLUCOSE, CAPILLARY
Glucose-Capillary: 80 mg/dL (ref 70–99)
Glucose-Capillary: 87 mg/dL (ref 70–99)

## 2022-10-11 SURGERY — MYRINGOTOMY WITH TUBE PLACEMENT
Anesthesia: General | Site: Ear | Laterality: Left

## 2022-10-11 MED ORDER — ONDANSETRON HCL 4 MG/2ML IJ SOLN
INTRAMUSCULAR | Status: DC | PRN
  Administered 2022-10-11: 4 mg via INTRAVENOUS

## 2022-10-11 MED ORDER — OXYCODONE HCL 5 MG/5ML PO SOLN: 5.0000 mg | Freq: Once | ORAL | Status: DC | PRN

## 2022-10-11 MED ORDER — LIDOCAINE HCL (CARDIAC) PF 100 MG/5ML IV SOSY
PREFILLED_SYRINGE | INTRAVENOUS | Status: DC | PRN
  Administered 2022-10-11: 50 mg via INTRAVENOUS

## 2022-10-11 MED ORDER — LACTATED RINGERS IV SOLN: INTRAVENOUS | Status: DC

## 2022-10-11 MED ORDER — CIPROFLOXACIN-DEXAMETHASONE 0.3-0.1 % OT SUSP
OTIC | Status: DC | PRN
  Administered 2022-10-11: 2 [drp] via OTIC

## 2022-10-11 MED ORDER — DEXAMETHASONE SODIUM PHOSPHATE 4 MG/ML IJ SOLN
INTRAMUSCULAR | Status: DC | PRN
  Administered 2022-10-11: 4 mg via INTRAVENOUS

## 2022-10-11 MED ORDER — MIDAZOLAM HCL 5 MG/5ML IJ SOLN
INTRAMUSCULAR | Status: DC | PRN
  Administered 2022-10-11: 2 mg via INTRAVENOUS

## 2022-10-11 MED ORDER — PROPOFOL 10 MG/ML IV BOLUS
INTRAVENOUS | Status: DC | PRN
  Administered 2022-10-11: 150 mg via INTRAVENOUS

## 2022-10-11 MED ORDER — OXYCODONE HCL 5 MG PO TABS: 5.0000 mg | ORAL_TABLET | Freq: Once | ORAL | Status: DC | PRN

## 2022-10-11 MED ORDER — FENTANYL CITRATE PF 50 MCG/ML IJ SOSY: 25.0000 ug | PREFILLED_SYRINGE | INTRAMUSCULAR | Status: DC | PRN

## 2022-10-11 MED ORDER — FENTANYL CITRATE (PF) 100 MCG/2ML IJ SOLN
INTRAMUSCULAR | Status: DC | PRN
  Administered 2022-10-11: 50 ug via INTRAVENOUS

## 2022-10-11 SURGICAL SUPPLY — 10 items
BALL CTTN LRG ABS STRL LF (GAUZE/BANDAGES/DRESSINGS) ×1
CANISTER SUCT 1200ML W/VALVE (MISCELLANEOUS) ×1 IMPLANT
COTTONBALL LRG STERILE PKG (GAUZE/BANDAGES/DRESSINGS) ×1 IMPLANT
GLOVE SURG GAMMEX PI TX LF 7.5 (GLOVE) ×1 IMPLANT
Myringotomy Blade IMPLANT
STRAP BODY AND KNEE 60X3 (MISCELLANEOUS) ×1 IMPLANT
TOWEL OR 17X26 4PK STRL BLUE (TOWEL DISPOSABLE) ×1 IMPLANT
TUBE EAR T 1.27X5.3 BFLY (OTOLOGIC RELATED) IMPLANT
TUBING CONN 6MMX3.1M (TUBING) ×1
TUBING SUCTION CONN 0.25 STRL (TUBING) ×1 IMPLANT

## 2022-10-11 NOTE — H&P (Signed)
..  History and Physical paper copy reviewed and updated date of procedure and will be scanned into system.  Patient seen and examined and left side marked.  Will proceed with Left M&T with Butterfly tube and possible Right depending on exam during surgery.

## 2022-10-11 NOTE — Op Note (Signed)
..  10/11/2022  9:55 AM    Kathlen Mody  [id number]   Pre-Op Dx:  Verdie Drown tube dysfunction  Post-op Dx: Eustachian tube dysfunction  Proc:  1)  LEFT myringotomy with BUTTERFLY tube  2)  Examination of right ear under anesthesia  Surg: Jeannie Fend Emmitte Surgeon  Anes:  General by mask  EBL:  None  Comp:  None  Findings:  Bilateral myringosclerosis and area of dimeric membrane from previous tube placement.  Mild retraction on left side but no significant fluid.  Butterfly tube placed in anterior inferior aspect of TM on LEFT.  Right middle ear space normal with no fluid and normal mobility to palpation so no tube placement done on right.  Procedure: With the patient in a comfortable supine position, general mask anesthesia was administered.  At an appropriate level, microscope and speculum were used to examine and clean the LEFT ear canal.  The findings were as described above.  An anterior inferior radial myringotomy incision was sharply executed.  Middle ear contents were suctioned clear with a size 5 otologic suction.  A BUTTERFLY PE tube was placed without difficulty using a Rosen pick and Animal nutritionist.  Ciprodex otic solution was instilled into the external canal, and insufflated into the middle ear.  A cotton ball was placed at the external meatus. Hemostasis was observed.  This side was completed.  After completing the LEFT side, the right side was evaluated and the middle ear space was normal in appearance with no fluid and good inflation with no retraction.  There was myringosclerosis but with normal mobility of TM with palpation with curette.  Therefore no myringotomy and tympanostomy tube placed.  Following this, the patient was returned to anesthesia, awakened, and transferred to recovery in stable condition.  Dispo:  PACU to home  Plan: Routine drop use and water precautions.  Recheck my office three weeks.   Jeannie Fend Sun Wilensky 9:55 AM 10/11/2022

## 2022-10-11 NOTE — Anesthesia Postprocedure Evaluation (Signed)
Anesthesia Post Note  Patient: CHEROKEE SCRIVER  Procedure(s) Performed: LEFT MYRINGOTOMY WITH TUBE PLACEMENT (BUTTERFLY) (Left: Ear)  Patient location during evaluation: PACU Anesthesia Type: General Level of consciousness: awake and alert Pain management: pain level controlled Vital Signs Assessment: post-procedure vital signs reviewed and stable Respiratory status: spontaneous breathing, nonlabored ventilation, respiratory function stable and patient connected to nasal cannula oxygen Cardiovascular status: blood pressure returned to baseline and stable Postop Assessment: no apparent nausea or vomiting Anesthetic complications: no   No notable events documented.   Last Vitals:  Vitals:   10/11/22 1002 10/11/22 1015  BP: (!) 147/79 (!) 153/77  Pulse: 65 70  Resp: (!) 21 (!) 22  Temp: 36.7 C 36.7 C  SpO2: 95% 92%    Last Pain:  Vitals:   10/11/22 1015  TempSrc:   PainSc: 0-No pain                 Precious Haws Noela Brothers

## 2022-10-11 NOTE — Transfer of Care (Signed)
Immediate Anesthesia Transfer of Care Note  Patient: Jeffery Reed  Procedure(s) Performed: LEFT MYRINGOTOMY WITH TUBE PLACEMENT (BUTTERFLY) (Left: Ear)  Patient Location: PACU  Anesthesia Type: General ETT  Level of Consciousness: awake, alert  and patient cooperative  Airway and Oxygen Therapy: Patient Spontanous Breathing and Patient connected to supplemental oxygen  Post-op Assessment: Post-op Vital signs reviewed, Patient's Cardiovascular Status Stable, Respiratory Function Stable, Patent Airway and No signs of Nausea or vomiting  Post-op Vital Signs: Reviewed and stable  Complications: No notable events documented.

## 2022-10-11 NOTE — Anesthesia Preprocedure Evaluation (Signed)
Anesthesia Evaluation  Patient identified by MRN, date of birth, ID band Patient awake    Reviewed: Allergy & Precautions, NPO status , Patient's Chart, lab work & pertinent test results  History of Anesthesia Complications Negative for: history of anesthetic complications  Airway Mallampati: III  TM Distance: <3 FB Neck ROM: full    Dental  (+) Chipped   Pulmonary sleep apnea and Continuous Positive Airway Pressure Ventilation    Pulmonary exam normal        Cardiovascular Exercise Tolerance: Good hypertension, (-) angina Normal cardiovascular exam     Neuro/Psych Seizures -, Well Controlled,   negative psych ROS   GI/Hepatic Neg liver ROS,GERD  Controlled,,  Endo/Other  negative endocrine ROSdiabetes    Renal/GU Renal disease     Musculoskeletal   Abdominal   Peds  Hematology negative hematology ROS (+)   Anesthesia Other Findings Past Medical History: No date: Allergy No date: Anxiety No date: Cataract     Comment:  beginning stage 01/22/2019 No date: Chronic kidney disease     Comment:  enlarge bladder No date: Diabetes mellitus without complication (HCC)     Comment:  Type 2 No date: GERD (gastroesophageal reflux disease) No date: Gout No date: HOH (hard of hearing)     Comment:  bilateral, no aids No date: Hyperlipidemia No date: Hypertension No date: OSA (obstructive sleep apnea)     Comment:  uses CPAP No date: Seizures ([place])     Comment:  last 3 years ago in MAY/ benign brain tumor No date: Sleep apnea     Comment:  c pap 08/17/2013: Type 2 diabetes mellitus with stage 3 chronic kidney  disease, without long-term current use of insulin (HCC) No date: Urinary frequency  Past Surgical History: 2020: COLONOSCOPY     Comment:  Dr.Jacobs 05/04/2022: COLONOSCOPY WITH PROPOFOL; N/A     Comment:  Procedure: COLONOSCOPY WITH PROPOFOL;  Surgeon: Doran Stabler, MD;  Location: [facility];  Service:               Gastroenterology;  Laterality: N/A; 12/29/2019: CRANIOTOMY; Right     Comment:  Procedure: Craniotomy for Tumor;  Surgeon: Kristeen Miss, MD;  Location: [place];  Service: Neurosurgery;                Laterality: Right;  Craniotomy for Tumor Excision 10/2009: FOOT SURGERY 02/2005: MYRINGOTOMY; Left     Comment:  Dr.Newman 05/04/2022: POLYPECTOMY     Comment:  Procedure: POLYPECTOMY;  Surgeon: Doran Stabler,               MD;  Location: [facility];  Service: Gastroenterology;; 1952: TONSILLECTOMY  BMI    Body Mass Index: 30.73 kg/m      Reproductive/Obstetrics negative OB ROS                             Anesthesia Physical Anesthesia Plan  ASA: 3  Anesthesia Plan: General ETT   Post-op Pain Management:    Induction: Intravenous  PONV Risk Score and Plan: Ondansetron, Dexamethasone, Midazolam and Treatment may vary due to age or medical condition  Airway Management Planned: Oral ETT and Video Laryngoscope Planned  Additional Equipment:   Intra-op Plan:   Post-operative Plan: Extubation in OR  Informed Consent: I have reviewed the patients History and Physical, chart, labs and discussed the procedure including the risks, benefits and alternatives for the proposed anesthesia with the patient or authorized representative who has indicated his/her understanding and acceptance.     Dental Advisory Given  Plan Discussed with: Anesthesiologist, CRNA and Surgeon  Anesthesia Plan Comments: (Patient consented for risks of anesthesia including but not limited to:  - adverse reactions to medications - damage to eyes, teeth, lips or other oral mucosa - nerve damage due to positioning  - sore throat or hoarseness - Damage to heart, brain, nerves, lungs, other parts of body or loss of life  Patient voiced understanding.)       Anesthesia Quick Evaluation

## 2022-10-12 ENCOUNTER — Ambulatory Visit (INDEPENDENT_AMBULATORY_CARE_PROVIDER_SITE_OTHER): Payer: Medicare Other | Admitting: Internal Medicine

## 2022-10-12 ENCOUNTER — Encounter: Payer: Self-pay | Admitting: Otolaryngology

## 2022-10-12 VITALS — BP 128/80 | HR 73 | Ht 70.0 in | Wt 220.0 lb

## 2022-10-12 DIAGNOSIS — E1169 Type 2 diabetes mellitus with other specified complication: Secondary | ICD-10-CM

## 2022-10-12 DIAGNOSIS — E1122 Type 2 diabetes mellitus with diabetic chronic kidney disease: Secondary | ICD-10-CM | POA: Diagnosis not present

## 2022-10-12 DIAGNOSIS — N1832 Chronic kidney disease, stage 3b: Secondary | ICD-10-CM

## 2022-10-12 DIAGNOSIS — E785 Hyperlipidemia, unspecified: Secondary | ICD-10-CM | POA: Diagnosis not present

## 2022-10-12 LAB — POCT GLYCOSYLATED HEMOGLOBIN (HGB A1C): Hemoglobin A1C: 5.9 % — AB (ref 4.0–5.6)

## 2022-10-12 NOTE — Patient Instructions (Addendum)
Please continue: - Metformin 500 mg 2x a day - Glipizide 10 mg 2x a day before meals - Trulicity 4.5 mg weekly  Try to check some blood sugars later in the day especially at bedtime. Let me know if you have sugars repeatedly <70.  Please send me the name of your supplier.  Please return in 4-6 months.

## 2022-10-12 NOTE — Progress Notes (Signed)
Patient ID: Jeffery Reed, male   DOB: 01/16/1946, 77 y.o.   MRN: [id number]   HPI: Jeffery Reed is a 77 y.o.-year-old male, initially referred by his PCP, Jeffery Pinto, MD, returning for follow-up for DM2, dx in 2018, insulin-dependent since 12/2019, uncontrolled, with complications (CKD stage III, diabetic retinopathy).  Last visit 3 months ago.  Interim history: No blurry vision, nausea, chest pain. He had ear tube placed yesterday (10/11/2022).  Reviewed HbA1c levels: Lab Results  Component Value Date   HGBA1C 6.3 (H) 07/05/2022   HGBA1C 6.8 (H) 03/29/2022   HGBA1C 6.8 (A) 01/23/2022   HGBA1C 7.1 (H) 09/14/2021   HGBA1C 6.5 (H) 06/15/2021   HGBA1C 6.0 (H) 03/03/2021   HGBA1C 6.7 (A) 01/19/2021   HGBA1C 5.7 (A) 09/20/2020   HGBA1C 5.5 05/20/2020   HGBA1C 6.3 (H) 03/02/2020   Pt is on a regimen of: - Metformin ER 1000 mg 2x a day, with meals >> ... >> 500 mg 2x a day  - Glipizide 05-25-09 >> 10-10 >> 05-25-09 >> 10 mg 2x a day, before meals - Trulicity [id number] mg weekly - started 01/2021 >> 1.5 mg >> 3 >> 4.5 mg weekly (by PCP) -  >> stopped 04/2020   Pt checks his sugars once a day: - am: [id number] >> off Trulicity: [id number] >> A999333 >> 110-120 >> 69, 71-120 - 2h after b'fast: 100, 148, 243 >> 111-167 >> n/c >> 140-159 >> n/c - before lunch: 144-167 >> n/c >> 185, 210 >> 80-100 >> 106, 121, 165 - 2h after lunch: 202 >> n/c >> 118 >> 125, 127 >> n/c >> 155 - before dinner:  126, 301 >> [id number] >> off Trulicity: [id number] >> n/c >> 180 - 2h after dinner: n/c >> 234 >> n/c >> 240 >> n/c >> 260 >> n/c - bedtime: 92, 230 >> n/c >> 94 >> n/c >> 140, 158, 310 >> 240 >> n/c - nighttime: n/c Lowest sugar was 82 >> 78 >> 82 >> 69; he has hypoglycemia awareness at 100. Highest sugar was 214 >> 310 >> 182 (gatorade) >> 165  Glucometer: Freestyle  Pt's meals are: - Breakfast:  Instant oatmeal or [employer] sausage bisuit or scrambled eggs - Lunch: tomato sandwich, ham - Dinner: larger meal:  potatoes; spaghetti, lasagna, chicken, veggies: green beans, corn, broccoli - Snacks:potato chips, apples Diet sodas, unsweet tea,  -+ CKD stage 3b-sees nephrology, last BUN/creatinine:  Lab Results  Component Value Date   BUN 28 (H) 07/05/2022   BUN 29 (H) 03/29/2022   CREATININE 2.03 (H) 07/05/2022   CREATININE 1.89 (H) 03/29/2022  02/2021: GFR 35 On valsartan 320.  -+ HL; last set of lipids: Lab Results  Component Value Date   CHOL 149 07/05/2022   HDL 29 (L) 07/05/2022   LDLCALC 92 07/05/2022   TRIG 183 (H) 07/05/2022   CHOLHDL 5.1 (H) 07/05/2022  On Crestor 10, fenofibrate 160.  - last eye exam was on 02/28/2022: No DR; prev.+ DR, + small cataract.   -+ Mild, occasional numbness and tingling in his feet.  Foot exam performed 03/28/2022.  Pt has no FH of DM.  He also has HTN, OSA. Patient has a history of meningioma surgery in 2021.  He recovered well afterwards.  However, he had a seizure in 2022  >> started Keppra.  He is a retired Airline pilot.  During the winter, he drives a schoolbus.   ROS: + See HPI  I reviewed pt's medications, allergies, PMH, social hx,  family hx, and changes were documented in the history of present illness. Otherwise, unchanged from my initial visit note.  Past Medical History:  Diagnosis Date   Allergy    Anxiety    Cataract    beginning stage 01/22/2019   Chronic kidney disease    enlarge bladder   Diabetes mellitus without complication (HCC)    Type 2   GERD (gastroesophageal reflux disease)    Gout    HOH (hard of hearing)    bilateral, no aids   Hyperlipidemia    Hypertension    OSA (obstructive sleep apnea)    uses CPAP   Seizures ([place])    last 3 years ago in [place]/ benign brain tumor   Sleep apnea    c pap   Type 2 diabetes mellitus with stage 3 chronic kidney disease, without long-term current use of insulin ([place]) 08/17/2013   Urinary frequency    Past Surgical History:  Procedure Laterality Date   COLONOSCOPY  2020    Dr.Jacobs   COLONOSCOPY WITH PROPOFOL N/A 05/04/2022   Procedure: COLONOSCOPY WITH PROPOFOL;  Surgeon: Doran Stabler, MD;  Location: [facility];  Service: Gastroenterology;  Laterality: N/A;   CRANIOTOMY Right 12/29/2019   Procedure: Craniotomy for Tumor;  Surgeon: Kristeen Miss, MD;  Location: [place];  Service: Neurosurgery;  Laterality: Right;  Craniotomy for Tumor Excision   FOOT SURGERY  10/2009   MYRINGOTOMY Left 02/2005   Dr.Newman   MYRINGOTOMY WITH TUBE PLACEMENT Left 10/11/2022   Procedure: LEFT MYRINGOTOMY WITH TUBE PLACEMENT (BUTTERFLY);  Surgeon: Carloyn Manner, MD;  Location: [place];  Service: ENT;  Laterality: Left;  Diabetic   POLYPECTOMY  05/04/2022   Procedure: POLYPECTOMY;  Surgeon: Doran Stabler, MD;  Location: [facility];  Service: Gastroenterology;;   TONSILLECTOMY  1953   Social History   Socioeconomic History   Marital status: Married    Spouse name: Not on file   Number of children: Not on file   Years of education: Not on file   Highest education level: Not on file  Occupational History   Occupation: Recruitment consultant    Comment: on STD 10/25/20  Tobacco Use   Smoking status: Never   Smokeless tobacco: Never  Vaping Use   Vaping Use: Never used  Substance and Sexual Activity   Alcohol use: No   Drug use: No   Sexual activity: Not on file  Other Topics Concern   Not on file  Social History Narrative   Lives with wife   Right handed   Drinks 3-4 cups caffeine daily   Social Determinants of Health   Financial Resource Strain: Not on file  Food Insecurity: No Food Insecurity (11/08/2021)   Hunger Vital Sign    Worried About Running Out of Food in the Last Year: Never true    Ran Out of Food in the Last Year: Never true  Transportation Needs: No Transportation Needs (11/08/2021)   PRAPARE - Hydrologist (Medical): No    Lack of Transportation (Non-Medical): No  Physical Activity: Not on file  Stress:  Not on file  Social Connections: Not on file  Intimate Partner Violence: Not on file   Current Outpatient Medications on File Prior to Visit  Medication Sig Dispense Refill   acetaminophen (TYLENOL) 650 MG CR tablet Take 650 mg by mouth every 8 (eight) hours as needed for pain (or headaches).     albuterol (VENTOLIN HFA) 108 (90  Base) MCG/ACT inhaler Inhale into the lungs every 6 (six) hours as needed for wheezing or shortness of breath.     Cholecalciferol (VITAMIN D3) 10000 units capsule Take 10,000 Units by mouth in the morning.     citalopram (CELEXA) 40 MG tablet Take  1 tablet  Daily  for Mood & Chronic Anxiety 90 tablet 3   Dulaglutide (TRULICITY) 4.5 0000000 SOPN Inject  4.5 mg  into Skin  every 7 days  for Diabetes  (Dx: e11.29 ) (Patient taking differently: Inject 4.5 mg into the skin every Saturday. Inject  4.5 mg  into Skin  every 7 days  for Diabetes  (Dx: e11.29 )) 6 mL 3   fenofibrate 160 MG tablet Take 1 tablet  Daily for Triglycerides (Blood Fats)                                     /                      TAKE                            BY                        MOUTH (Patient taking differently: Take 160 mg by mouth every evening.) 90 tablet 3   fluticasone (FLONASE) 50 MCG/ACT nasal spray Place 1-2 sprays into both nostrils daily as needed for allergies or rhinitis.     glipiZIDE (GLUCOTROL) 5 MG tablet Take  1 to 2 tablets  3 x /day  with Meals  for Diabetes                                                               /                                           TAKE                                    BY                                    MOUTH 540 tablet 3   glucose blood (FREESTYLE LITE) test strip Check blood sugar 1 time daily-DX-E11.22 100 each 5   levETIRAcetam (KEPPRA) 500 MG tablet Take 1 tablet (500 mg total) by mouth 2 (two) times daily. 180 tablet 3   metFORMIN (GLUCOPHAGE) 500 MG tablet TAKE 1 TABLET BY MOUTH 3 TIMES  DAILY (Patient taking differently: Take 500 mg by  mouth in the morning and at bedtime.) 270 tablet 3   Na Sulfate-K Sulfate-Mg Sulf 17.5-3.13-1.6 GM/177ML SOLN Take 1 kit by mouth as directed. May use generic SUPREP;NO prior authorizations will be done.Please use Singlecare or GOOD-RX coupon. 354 mL 0   ofloxacin (FLOXIN) 0.3 % OTIC solution Place 4  drops into affected ears 2 (two) times daily. (DOS 10/11/22) 10 mL 0   omeprazole (PRILOSEC) 40 MG capsule Take  1 capsule Daily to Prevent Indigestion & Heart burn                                  /                             TAKE                            BY                        MOUTH 90 capsule 3   rosuvastatin (CRESTOR) 10 MG tablet TAKE 1 TABLET BY MOUTH  DAILY FOR CHOLESTEROL 90 tablet 3   Tetrahydroz-Glyc-Hyprom-PEG (VISINE MAXIMUM REDNESS RELIEF) 0.05-0.2-0.36-1 % SOLN Place 1-2 drops into both eyes 3 (three) times daily as needed (dry/irrtated eyes.).     valsartan (DIOVAN) 160 MG tablet Take  1 tablet  Daily  for BP & Diabetic Kidney Protection  /                               /                    TAKE                    BY             MOUTH 90 tablet 3   No current facility-administered medications on file prior to visit.   Allergies  Allergen Reactions   Cardura [Doxazosin Mesylate] Other (See Comments)    Nasal congestion   Codeine Other (See Comments)    The patient passed out   Hytrin [Terazosin] Other (See Comments)    Reaction not recalled   Family History  Problem Relation Age of Onset   Hypertension Mother    GER disease Mother    Hyperlipidemia Father    Hypertension Father    Parkinson's disease Father    Hypertension Brother    Diabetes Maternal Grandmother    Stroke Paternal Grandmother    Diabetes Paternal Grandmother    Stroke Paternal Grandfather    Colon cancer Neg Hx    Colon polyps Neg Hx    Esophageal cancer Neg Hx    Stomach cancer Neg Hx    Rectal cancer Neg Hx    PE: BP 128/80 (BP Location: Right Arm, Patient Position: Sitting, Cuff Size: Normal)    Pulse 73   Ht 5' 10"$  (1.778 m)   Wt 220 lb (99.8 kg)   SpO2 94%   BMI 31.57 kg/m  Wt Readings from Last 3 Encounters:  10/12/22 220 lb (99.8 kg)  10/11/22 214 lb 3.2 oz (97.2 kg)  07/05/22 217 lb 3.2 oz (98.5 kg)   Constitutional: overweight, in NAD, + shuffling gait Eyes:EOMI, no exophthalmos ENT: no thyromegaly, no cervical lymphadenopathy Cardiovascular: RRR, No MRG Respiratory: CTA B Musculoskeletal: no deformities Skin: moist, warm, no rashes Neurological: + mild tremor with outstretched R hand  ASSESSMENT: 1. DM2, insulin-dependent, previously uncontrolled, with long-term complications - CKD stage 3 - DR  2.  Hyperlipidemia  3. Obesity class I  PLAN:  1. Patient with longstanding, previously uncontrolled type 2 diabetes, now with better control, on metformin, sulfonylurea, and GLP-1 receptor agonist.  At last visit, HbA1c was excellent, at 6.3%, improved from 6.8%.  At that time, he returned after his PCP increased his Trulicity dose to 4.5 mg weekly.  He was tolerating this well and sugars were at goal.  He was not checking later in the day and I did advise him to do so but we did not change his regimen. -Blood sugars at home appear to be at goal, with value under 70.  He is not checking sugars later in the day and I did advise him to check some at that time and let me know if they are low.  In that case, we may need to reduce the dose of glipizide.  Otherwise, for now, advised him to continue the same regimen. -He is interested in a CGM.  He will let me know the supplier that he uses for his CPAP so we can call it in.  Since he has been having low blood sugars, he will likely qualify for the CGM. - I suggested to:  Patient Instructions  Please continue: - Metformin 500 mg 2x a day - Glipizide 10 mg 2x a day before meals - Trulicity 4.5 mg weekly  Try to check some blood sugars later in the day especially at bedtime. Let me know if you have sugars repeatedly  <70.  Please send me the name of your supplier.  Please return in 4-6 months.  - we checked his HbA1c: 5.9% (lower) - advised to check sugars at different times of the day - 4x a day, rotating check times - advised for yearly eye exams >> he is UTD - return to clinic in 3-4 months  2. HL -Reviewed latest lipid panel from 06/2022: LDL above our target of less than 70, triglycerides high, HDL low: Lab Results  Component Value Date   CHOL 149 07/05/2022   HDL 29 (L) 07/05/2022   LDLCALC 92 07/05/2022   TRIG 183 (H) 07/05/2022   CHOLHDL 5.1 (H) 07/05/2022  -He is on Crestor 10 mg daily and fenofibrate 160 mg daily.  At last visit he described muscle aches and I advised him to take co-Q10.  3.  Obesity class I -He continues on Trulicity, which we will start with weight loss -He gained 12 pounds before last visit and 6 more since then  Philemon Kingdom, MD PhD [facility]

## 2022-10-18 ENCOUNTER — Encounter: Payer: Self-pay | Admitting: Internal Medicine

## 2022-10-18 ENCOUNTER — Ambulatory Visit (INDEPENDENT_AMBULATORY_CARE_PROVIDER_SITE_OTHER): Payer: Medicare Other | Admitting: Internal Medicine

## 2022-10-18 VITALS — BP 120/70 | HR 71 | Temp 97.3°F | Ht 70.0 in | Wt 216.4 lb

## 2022-10-18 DIAGNOSIS — I10 Essential (primary) hypertension: Secondary | ICD-10-CM

## 2022-10-18 DIAGNOSIS — E1122 Type 2 diabetes mellitus with diabetic chronic kidney disease: Secondary | ICD-10-CM

## 2022-10-18 DIAGNOSIS — R569 Unspecified convulsions: Secondary | ICD-10-CM | POA: Diagnosis not present

## 2022-10-18 DIAGNOSIS — E785 Hyperlipidemia, unspecified: Secondary | ICD-10-CM | POA: Diagnosis not present

## 2022-10-18 DIAGNOSIS — G4733 Obstructive sleep apnea (adult) (pediatric): Secondary | ICD-10-CM | POA: Diagnosis not present

## 2022-10-18 DIAGNOSIS — E1169 Type 2 diabetes mellitus with other specified complication: Secondary | ICD-10-CM | POA: Diagnosis not present

## 2022-10-18 DIAGNOSIS — E559 Vitamin D deficiency, unspecified: Secondary | ICD-10-CM | POA: Diagnosis not present

## 2022-10-18 DIAGNOSIS — Z79899 Other long term (current) drug therapy: Secondary | ICD-10-CM | POA: Diagnosis not present

## 2022-10-18 DIAGNOSIS — N1832 Chronic kidney disease, stage 3b: Secondary | ICD-10-CM

## 2022-10-18 NOTE — Progress Notes (Signed)
Future Appointments  Date Time Provider Department  10/18/2022                  6 mo 11:30 AM Unk Pinto, MD [facility]  11/07/2022  2:15 PM Frann Rider, NP [facility]  03/30/2023                   cpe 10:00 AM Unk Pinto, MD [facility]  04/12/2023  1:40 PM Philemon Kingdom, MD [facility]    History of Present Illness:      This very nice 77 y.o. MWM presents for 6  month follow up with HTN, HLD, T2_NIDDM  and Vitamin D Deficiency.  In May 2020, patient had craniotomy for excision of a Frontal lobe meningioma and has been on Keppra since. Patient is followed by Dr Brett Fairy on CPAP for OSA with improved restorative sleep.        Patient is treated for HTN (1992) & BP has been controlled at home. Today's BP was at goal - 120/70.  Patient has had no complaints of any cardiac type chest pain, palpitations, dyspnea Vertell Limber /PND, dizziness, claudication or dependent edema.       Hyperlipidemia is controlled with diet & meds. Patient denies myalgias or other med SE's. Last Lipids were at goal except elevated Trig's :   Lab Results  Component Value Date   CHOL 149 07/05/2022   HDL 29 (L) 07/05/2022   LDLCALC 92 07/05/2022   TRIG 183 (H) 07/05/2022   CHOLHDL 5.1 (H) 07/05/2022     Also, the patient has history of  PreDiabetes (2008) and then T2_NIDDM  (2014) & started Insulin in 2021 after craniotomy for a meningioma.  As he improved , he tapered off of insulin and back on Metformin & Glucotrol. Marland Kitchen  He does have CKD 3b  w /GFR 38 . and has had no symptoms of reactive hypoglycemia, diabetic polys, paresthesias or visual blurring.  Last A1c was near goal:  Lab Results  Component Value Date   HGBA1C 5.9 (A) 10/12/2022                                                       Further, the patient also has history of Vitamin D Deficiency ("22" / 2008) and supplements vitamin D without any suspected side-effects. Last vitamin D was at goal :  Lab Results  Component Value Date    VD25OH 72 03/03/2021     Current Outpatient Medications  Medication Instructions   acetaminophen (TYLENOL) 650 mg, Oral, Every 8 hours PRN   albuterol HFA inhaler Inhalation, Every 6 hours PRN   citalopram (CELEXA) 40 MG tablet Take  1 tablet  Daily  for Mood & Chronic Anxiety   Dulaglutide (TRULICITY) 4.5 0000000  Inject  4.5 mg  into Skin  every 7 day   fenofibrate 160 MG tablet Take 1 tablet  Daily    FLONASE nasal spray 1-2 sprays, Each Nare, Daily PRN   glipiZIDE 5 MG tablet Take  1 to 2 tablets  3 x /day  with Meals    glucose blood test strip Check blood sugar 1 time daily-[id number]   levETIRAcetam (KEPPRA) 500 mg, Oral, 2 times daily   metFORMIN\ 500 mg, Oral, 3 times daily   ofloxacin (FLOXIN) 0.3 % OTIC solution Place 4 drops  into affected ears 2 (two) times daily. (DOS 10/11/22)   omeprazole (PRILOSEC) 40 MG capsule Take  1 capsule Daily to Prevent Indigestion    rosuvastatin (CRESTOR) 10 MG tablet TAKE 1 TABLET BY MOUTH  DAILY FOR CHOLESTEROL   Tetrahydroz-Glyc-Hyprom-PEG (VISINE MAXIMUM REDNESS RELIEF) 0.05-0.2-0.36-1 % SOLN 1-2 drops, Both Eyes, 3 times daily PRN   valsartan (DIOVAN) 160 MG tablet Take  1 tablet  Daily  for BP & Diabetic Kidney    Vitamin D3 10,000 Units, Oral, Every morning      Allergies  Allergen Reactions   Cardura [Doxazosin Mesylate] Nasal congestion   Codeine The patient passed out   Hytrin [Terazosin] Reaction not recalled         PMHx:   Past Medical History:  Diagnosis Date   Allergy    Anxiety    Cataract    beginning stage 01/22/2019   Chronic kidney disease    enlarge bladder   Diabetes mellitus without complication (HCC)    GERD (gastroesophageal reflux disease)    Gout    Hyperlipidemia    Hypertension    OSA (obstructive sleep apnea)    Pre-diabetes    Seizures ([place])    Sleep apnea    c pap     Immunization History  Administered Date(s) Administered   DT  06/01/2015   Fluad Quad (high Dose ) 05/20/2020    Influenza 06/05/2013   Influenza, High Dose  06/22/2017, 06/11/2018, 05/13/2019, 05/17/2021   PFIZER SARS-COV-2 Vacc 09/27/2019, 10/20/2019   Pneumococcal -13 08/19/2014   Pneumococcal -23 08/22/2003, 12/10/2015   Tdap 03/08/2004   Zoster, Live 08/18/2013     Past Surgical History:  Procedure Laterality Date   COLONOSCOPY     CRANIOTOMY Right 12/29/2019   Craniotomy for Tumor;  Kristeen Miss, MD   FOOT SURGERY  10/2009   MYRINGOTOMY Left 02-2005   Dr.Newman   TONSILLECTOMY  1953    FHx:    Reviewed / unchanged  SHx:    Reviewed / unchanged   Systems Review:  Constitutional: Denies fever, chills, wt changes, headaches, insomnia, fatigue, night sweats, change in appetite. Eyes: Denies redness, blurred vision, diplopia, discharge, itchy, watery eyes.  ENT: Denies discharge, congestion, post nasal drip, epistaxis, sore throat, earache, hearing loss, dental pain, tinnitus, vertigo, sinus pain, snoring.  CV: Denies chest pain, palpitations, irregular heartbeat, syncope, dyspnea, diaphoresis, orthopnea, PND, claudication or edema. Respiratory: denies cough, dyspnea, DOE, pleurisy, hoarseness, laryngitis, wheezing.  Gastrointestinal: Denies dysphagia, odynophagia, heartburn, reflux, water brash, abdominal pain or cramps, nausea, vomiting, bloating, diarrhea, constipation, hematemesis, melena, hematochezia  or hemorrhoids. Genitourinary: Denies dysuria, frequency, urgency, nocturia, hesitancy, discharge, hematuria or flank pain. Musculoskeletal: Denies arthralgias, myalgias, stiffness, jt. swelling, pain, limping or strain/sprain.  Skin: Denies pruritus, rash, hives, warts, acne, eczema or change in skin lesion(s). Neuro: No weakness, tremor, incoordination, spasms, paresthesia or pain. Psychiatric: Denies confusion, memory loss or sensory loss. Endo: Denies change in weight, skin or hair change.  Heme/Lymph: No excessive bleeding, bruising or enlarged lymph nodes.  Physical Exam  BP  120/70   Pulse 71   Temp (!) 97.3 F (36.3 C)   Ht '5\' 10"'$  (1.778 m)   Wt 216 lb 6.4 oz (98.2 kg)   SpO2 98%   BMI 31.05 kg/m   Appears  over nourished, well groomed  and in no distress.  Eyes: PERRLA, EOMs, conjunctiva no swelling or erythema. Sinuses: No frontal/maxillary tenderness ENT/Mouth: EAC's clear, TM's nl w/o erythema, bulging. Nares clear w/o erythema,  swelling, exudates. Oropharynx clear without erythema or exudates. Oral hygiene is good. Tongue normal, non obstructing. Hearing intact.  Neck: Supple. Thyroid not palpable. Car 2+/2+ without bruits, nodes or JVD. Chest: Respirations nl with BS clear & equal w/o rales, rhonchi, wheezing or stridor.  Cor: Heart sounds normal w/ regular rate and rhythm without sig. murmurs, gallops, clicks or rubs. Peripheral pulses normal and equal  without edema.  Abdomen: Soft & bowel sounds normal. Non-tender w/o guarding, rebound, hernias, masses or organomegaly.  Lymphatics: Unremarkable.  Musculoskeletal: Full ROM all peripheral extremities, joint stability, 5/5 strength and normal gait.  Skin: Warm, dry without exposed rashes, lesions or ecchymosis apparent.  Neuro: Cranial nerves intact, reflexes equal bilaterally. Sensory-motor testing grossly intact. Tendon reflexes grossly intact.  Pysch: Alert & oriented x 3.  Insight and judgement nl & appropriate. No ideations.  Assessment and Plan:  1. Essential hypertension  - Continue medication, monitor blood pressure at home.  - Continue DASH diet.  Reminder to go to the ER if any CP,  SOB, nausea, dizziness, severe HA, changes vision/speech.    - CBC with Differential/Platelet - COMPLETE METABOLIC PANEL WITH GFR - Magnesium - TSH  2. Hyperlipidemia associated with type 2 diabetes mellitus ([place])  - Continue diet/meds, exercise,& lifestyle modifications.  - Continue monitor periodic cholesterol/liver & renal functions    - Lipid panel - TSH  3. Type 2 diabetes mellitus with  stage 3b chronic kidney  disease, without long-term current use of insulin (HCC)  - Continue diet, exercise  - Lifestyle modifications.  - Monitor appropriate labs   - Hemoglobin A1c  4. Vitamin D deficiency  - Continue supplementation   - VITAMIN D 25 Hydroxy  5. Seizures (HCC)  - Levetiracetam, Immunoassay  6. OSA on CPAP   7. Medication management  - CBC with Differential/Platelet - COMPLETE METABOLIC PANEL WITH GFR - Magnesium - Lipid panel - TSH - Hemoglobin A1c - VITAMIN D 25 Hydroxy - Levetiracetam, Immunoassay        Discussed  regular exercise, BP monitoring, weight control to achieve/maintain BMI less than 25 and discussed med and SE's. Recommended labs to assess and monitor clinical status with further disposition pending results of labs.  I discussed the assessment and treatment plan with the patient. The patient was provided an opportunity to ask questions and all were answered. The patient agreed with the plan and demonstrated an understanding of the instructions.  I provided over 30 minutes of exam, counseling, chart review and  complex critical decision making.       The patient was advised to call back or seek an in-person evaluation if the symptoms worsen or if the condition fails to improve as anticipated.   Kirtland Bouchard, MD

## 2022-10-18 NOTE — Patient Instructions (Signed)

## 2022-10-19 LAB — COMPLETE METABOLIC PANEL WITH GFR
AG Ratio: 1.8 (calc) (ref 1.0–2.5)
ALT: 18 U/L (ref 9–46)
AST: 24 U/L (ref 10–35)
Albumin: 4.4 g/dL (ref 3.6–5.1)
Alkaline phosphatase (APISO): 49 U/L (ref 35–144)
BUN/Creatinine Ratio: 13 (calc) (ref 6–22)
BUN: 23 mg/dL (ref 7–25)
CO2: 24 mmol/L (ref 20–32)
Calcium: 9.6 mg/dL (ref 8.6–10.3)
Chloride: 109 mmol/L (ref 98–110)
Creat: 1.73 mg/dL — ABNORMAL HIGH (ref 0.70–1.28)
Globulin: 2.4 g/dL (calc) (ref 1.9–3.7)
Glucose, Bld: 75 mg/dL (ref 65–99)
Potassium: 4.4 mmol/L (ref 3.5–5.3)
Sodium: 142 mmol/L (ref 135–146)
Total Bilirubin: 0.5 mg/dL (ref 0.2–1.2)
Total Protein: 6.8 g/dL (ref 6.1–8.1)
eGFR: 41 mL/min/{1.73_m2} — ABNORMAL LOW (ref >=60)

## 2022-10-19 LAB — CBC WITH DIFFERENTIAL/PLATELET
Absolute Monocytes: 664 cells/uL (ref 200–950)
Basophils Absolute: 64 cells/uL (ref 0–200)
Basophils Relative: 0.8 %
Eosinophils Absolute: 224 cells/uL (ref 15–500)
Eosinophils Relative: 2.8 %
HCT: 40 % (ref 38.5–50.0)
Hemoglobin: 13.6 g/dL (ref 13.2–17.1)
Lymphs Abs: 1880 cells/uL (ref 850–3900)
MCH: 30 pg (ref 27.0–33.0)
MCHC: 34 g/dL (ref 32.0–36.0)
MCV: 88.1 fL (ref 80.0–100.0)
MPV: 10.2 fL (ref 7.5–12.5)
Monocytes Relative: 8.3 %
Neutro Abs: 5168 cells/uL (ref 1500–7800)
Neutrophils Relative %: 64.6 %
Platelets: 214 10*3/uL (ref 140–400)
RBC: 4.54 10*6/uL (ref 4.20–5.80)
RDW: 13.2 % (ref 11.0–15.0)
Total Lymphocyte: 23.5 %
WBC: 8 10*3/uL (ref 3.8–10.8)

## 2022-10-19 LAB — LIPID PANEL
Cholesterol: 155 mg/dL (ref <200)
HDL: 33 mg/dL — ABNORMAL LOW (ref >=40)
LDL Cholesterol (Calc): 93 mg/dL (calc)
Non-HDL Cholesterol (Calc): 122 mg/dL (calc) (ref <130)
Total CHOL/HDL Ratio: 4.7 (calc) (ref <5.0)
Triglycerides: 191 mg/dL — ABNORMAL HIGH (ref <150)

## 2022-10-19 LAB — INSULIN, RANDOM: Insulin: 46.2 u[IU]/mL — ABNORMAL HIGH

## 2022-10-19 LAB — LEVETIRACETAM, IMMUNOASSAY: LEVETIRACETAM, IMMUNOASSAY: 16.2 ug/mL (ref 6.0–46.0)

## 2022-10-19 LAB — TSH: TSH: 1.82 mIU/L (ref 0.40–4.50)

## 2022-10-19 LAB — HEMOGLOBIN A1C
Hgb A1c MFr Bld: 6 % of total Hgb — ABNORMAL HIGH (ref <5.7)
Mean Plasma Glucose: 126 mg/dL
eAG (mmol/L): 7 mmol/L

## 2022-10-19 LAB — VITAMIN D 25 HYDROXY (VIT D DEFICIENCY, FRACTURES): Vit D, 25-Hydroxy: 53 ng/mL (ref 30–100)

## 2022-10-19 LAB — MAGNESIUM: Magnesium: 1.7 mg/dL (ref 1.5–2.5)

## 2022-10-20 NOTE — Progress Notes (Signed)
<><><><><><><><><><><><><><><><><><><><><><><><><><><><><><><><><> <><><><><><><><><><><><><><><><><><><><><><><><><><><><><><><><><> - Test results slightly outside the reference range are not unusual. If there is anything important, I will review this with you,  otherwise it is considered normal test values.  If you have further questions,  please do not hesitate to contact me at the office or via My Chart.  <><><><><><><><><><><><><><><><><><><><><><><><><><><><><><><><><> <><><><><><><><><><><><><><><><><><><><><><><><><><><><><><><><><>  -  Kidney Functions stable - Actually slightly improved !  <><><><><><><><><><><><><><><><><><><><><><><><><><><><><><><><><>  -  Chol = 155   &   LDL Chol = 93    - Both    Excellent   - Very low risk for Heart Attack  / Stroke <><><><><><><><><><><><><><><><><><><><><><><><><><><><><><><><><>  -  Triglycerides (  191  ) or fats in blood are too high                 (   Ideal or  Goal is less than 150  !  )    - Recommend avoid fried & greasy foods,  sweets / candy,   - Avoid white rice  (brown or wild rice or Quinoa is OK),   - Avoid white potatoes  (sweet potatoes are OK)   - Avoid anything made from white flour  - bagels, doughnuts, rolls, buns, biscuits, white and   wheat breads, pizza crust and traditional  pasta made of white flour & egg white  - (vegetarian pasta or spinach or wheat pasta is OK).    - Multi-grain bread is OK - like multi-grain flat bread or  sandwich thins.   - Avoid alcohol in excess.   - Exercise is also important. <><><><><><><><><><><><><><><><><><><><><><><><><><><><><><><><><>  -  A1c = 6.0 %  is much better from when it was 7.1%  and 6.8% 1 year  ago  !  <><><><><><><><><><><><><><><><><><><><><><><><><><><><><><><><><>  -   Magnesium = 1.7  is  very  low- goal is betw 2.0 - 2.5,   - So..........Marland Kitchen Recommend that you take Magnesium 500 mg tablet  2 x /day with Meals   - also important to eat lots  of  leafy green vegetables   - spinach - Kale - collards - greens - okra - asparagus  - broccoli - quinoa - squash - almonds   - black, red, white beans  -  peas - green beans <><><><><><><><><><><><><><><><><><><><><><><><><><><><><><><><><>  -  Vitamin D = 53  is low  -   - Vitamin D goal is between 70-100.   - Please make sure that you are                                          taking your Vitamin D   - 10,000 units /day  -  as directed.   - It is very important as a natural anti-inflammatory and helping the                              immune system protect against viral infections, like the Covid-19    helping hair, skin, and nails, as well as reducing stroke and heart attack risk.    - It helps your bones and helps with mood.  - It also decreases numerous cancer risks so please  take it as directed.   - Low Vit D is associated with a 200-300% higher risk for CANCER    and 200-300% higher risk for HEART   ATTACK  &  STROKE.    - It is also associated with higher death rate at younger ages,   autoimmune diseases like Rheumatoid arthritis, Lupus,  Multiple Sclerosis.     - Also many other serious conditions, like depression, Alzheimer's  Dementia, infertility, muscle aches, fatigue, fibromyalgia  <><><><><><><><><><><><><><><><><><><><><><><><><><><><><><><><><> <><><><><><><><><><><><><><><><><><><><><><><><><><><><><><><><><>  -  Keppra Level  ( Levetiracetam) is OK - - continue dose same . <><><><><><><><><><><><><><><><><><><><><><><><><><><><><><><><><>  -  All Else - CBC - Kidneys - Electrolytes - Liver - Magnesium & Thyroid    - all  Normal / OK <><><><><><><><><><><><><><><><><><><><><><><><><><><><><><><><><> <><><><><><><><><><><><><><><><><><><><><><><><><><><><><><><><><>

## 2022-11-06 NOTE — Progress Notes (Deleted)
[facility] [address] [place]. [place] [address] [phone]       OFFICE FOLLOW UP NOTE  [name] Date of Birth:  1946-06-11 Medical Record Number:  [id number]   Referring MD: Lesleigh Noe  Reason for Referral: Seizure and CPAP  No chief complaint on file.      HPI:   Update 11/06/2022 JM: Patient returns for yearly CPAP and seizure visit.  Stable from seizure standpoint without any recent seizure activity.  Compliant on Keppra 500 mg BID, tolerating well.  Continues to maintain ADLs and IADLs independently as well as driving.  OSA on CPAP ***       History provided for reference purposes only Update 11/02/2021 JM: Patient returns for initial CPAP compliance visit unaccompanied.  Previously seen 4 months ago for seizure follow-up and has been stable from that aspect since prior visit.  Compliant on Keppra without side effects and no seizure activity.  Initiated CPAP on 08/31/2021 after completion of HST which showed severe REM sleep dominant sleep apnea, pursued titration study which confirmed complex sleep apnea by Dr. Brett Fairy.  Review of compliance report shows 27 out of 30 usage days at 90% and 13 days greater than 4 hours with average usage 3 hours and 47 minutes.  Residual AHI 0.6.  Leaks in the 95 percentile 4.4.  Pressure in the 95th percentile 9.0.  On AutoPap 6-13 with EPR 2.  Reports overall tolerating CPAP well with use of nasal pillows. He does report restless sleep, will have nocturia and at times will have to leave his bed to sleep in recliner. He typically does not sleep more than 4 hours per night.    Epworth Sleepiness Scale 4/24 Fatigue severity scale 33/63   Update 07/06/2021 JM: Returns for 37-month seizure follow-up unaccompanied.  Overall stable since prior visit without any reoccurring seizure activity.  Remains on Keppra 500 mg twice daily tolerating without side effects.  Continues to live with his wife -able to  maintain ADLs and IADLs independently.  He drives his personal car without difficulty.  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  No further concerns at this time.  Update 12/29/2020 JM: Jeffery Reed returns for seizure follow-up after initial consult visit with Dr. Leonie Man 2 months ago.  He is unaccompanied at today's visit.  EEG 12/13/2020 did show mild irritability which increases predisposition of seizures and recommended continuation of Keppra.  He has remained on Keppra tolerating without side effects.  Denies any additional seizure activity.  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  He does report prior history of sleep apnea over 76 years ago previously using CPAP but he stopped using after weight loss.  He denies any witnessed apnea but does report snoring, occasional fatigue and nocturia.  He questions possible need of repeat sleep study.   Consult visit 10/25/2020 Dr. Leonie Man: Jeffery Reed is a pleasant 77 year old Caucasian male seen today for initial office consultation visit for seizure.  History is obtained from the patient and review of electronic medical records and I personally reviewed pertinent imaging films in PACS.  He has past medical history of hypertension, hyperlipidemia, prediabetes, obstructive sleep apnea on CPAP, gout, anxiety who was recently diagnosed with large 7 x 2.9 x 3.1 cm right frontal convexity meningioma in May 2021.  At that time he presented with left leg pain and had a  fall.  Routine brain imaging showed a large right frontal meningioma.  He underwent gross total excision of the meningioma on 12/29/2019 by Dr. Ellene Route and did well postoperatively.  He was on Decadron which was tapered he was also given Keppra 500 mg twice daily which he took for 6 months and seizure prophylaxis and overall tolerated very well though his wife had noticed mild personality  change.  He did discontinue the Keppra.  He presented on 10/15/2020 with episode of involuntary movements on his jaw and difficulty controlling it after eating food.  This lasted only a couple of minutes.  Wife noticed that he was rhythmically chewing and could not stop it and was choking.  He had trouble speaking and try to speak but words did not come out.  He did not have any alteration of consciousness or any tongue bite.  There was no extremity tonic-clonic movements.  Patient had no obvious trigger for this episode.  He does have some habitual nocturia and wakes up every 2 3 hours but the patient states he does sleep 6 to 8 hours every night.  He has no prior history of head injury, seizures, stroke, TIA, headaches or any other neurological problems.  There is no family history of epilepsy.  Patient has since been restarted on Keppra 500 mg twice daily which is tolerating well without any side effects.  He had an EEG done on 10/15/2020 which was also normal.  MRI scan of the brain done on 10/15/2020 showed no residual meningioma tumor with postoperative changes of craniotomy in the right frontal region.  No acute abnormality.  Overall no significant change compared to previous MRI from September 2021.  Patient denies drinking alcohol ,smoking marijuana or using any drugs of abuse.  ROS:   14 system review of systems is positive for those listed in HPI and all other systems negative  PMH:  Past Medical History:  Diagnosis Date   Allergy    Anxiety    Cataract    beginning stage 01/22/2019   Chronic kidney disease    enlarge bladder   Diabetes mellitus without complication (HCC)    Type 2   GERD (gastroesophageal reflux disease)    Gout    HOH (hard of hearing)    bilateral, no aids   Hyperlipidemia    Hypertension    OSA (obstructive sleep apnea)    uses CPAP   Seizures (HCC)    last 3 years ago in MAY/ benign brain tumor   Sleep apnea    c pap   Type 2 diabetes mellitus with stage 3  chronic kidney disease, without long-term current use of insulin ([place]) 08/17/2013   Urinary frequency     Social History:  Social History   Socioeconomic History   Marital status: Married    Spouse name: Not on file   Number of children: Not on file   Years of education: Not on file   Highest education level: Not on file  Occupational History   Occupation: Recruitment consultant    Comment: on STD 10/25/20  Tobacco Use   Smoking status: Never   Smokeless tobacco: Never  Vaping Use   Vaping Use: Never used  Substance and Sexual Activity   Alcohol use: No   Drug use: No   Sexual activity: Not on file  Other Topics Concern   Not on file  Social History Narrative   Lives with wife   Right handed   Drinks 3-4 cups caffeine daily  Social Determinants of Health   Financial Resource Strain: Not on file  Food Insecurity: No Food Insecurity (11/08/2021)   Hunger Vital Sign    Worried About Running Out of Food in the Last Year: Never true    Ran Out of Food in the Last Year: Never true  Transportation Needs: No Transportation Needs (11/08/2021)   PRAPARE - Hydrologist (Medical): No    Lack of Transportation (Non-Medical): No  Physical Activity: Not on file  Stress: Not on file  Social Connections: Not on file  Intimate Partner Violence: Not on file    Medications:   Current Outpatient Medications on File Prior to Visit  Medication Sig Dispense Refill   acetaminophen (TYLENOL) 650 MG CR tablet Take 650 mg by mouth every 8 (eight) hours as needed for pain (or headaches).     albuterol (VENTOLIN HFA) 108 (90 Base) MCG/ACT inhaler Inhale into the lungs every 6 (six) hours as needed for wheezing or shortness of breath.     Cholecalciferol (VITAMIN D3) 10000 units capsule Take 10,000 Units by mouth in the morning.     citalopram (CELEXA) 40 MG tablet Take  1 tablet  Daily  for Mood & Chronic Anxiety 90 tablet 3   Dulaglutide (TRULICITY) 4.5 0000000 SOPN Inject   4.5 mg  into Skin  every 7 days  for Diabetes  (Dx: e11.29 ) (Patient taking differently: Inject 4.5 mg into the skin every Saturday. Inject  4.5 mg  into Skin  every 7 days  for Diabetes  (Dx: e11.29 )) 6 mL 3   fenofibrate 160 MG tablet Take 1 tablet  Daily for Triglycerides (Blood Fats)                                     /                      TAKE                            BY                        MOUTH (Patient taking differently: Take 160 mg by mouth every evening.) 90 tablet 3   fluticasone (FLONASE) 50 MCG/ACT nasal spray Place 1-2 sprays into both nostrils daily as needed for allergies or rhinitis.     glipiZIDE (GLUCOTROL) 5 MG tablet Take  1 to 2 tablets  3 x /day  with Meals  for Diabetes                                                               /                                           TAKE  BY                                    MOUTH (Patient taking differently: Takes two tablet in the morning and two tablets in the evening) 540 tablet 3   glucose blood (FREESTYLE LITE) test strip Check blood sugar 1 time daily-DX-E11.22 100 each 5   levETIRAcetam (KEPPRA) 500 MG tablet Take 1 tablet (500 mg total) by mouth 2 (two) times daily. 180 tablet 3   metFORMIN (GLUCOPHAGE) 500 MG tablet TAKE 1 TABLET BY MOUTH 3 TIMES  DAILY (Patient taking differently: Take 500 mg by mouth in the morning and at bedtime.) 270 tablet 3   Na Sulfate-K Sulfate-Mg Sulf 17.5-3.13-1.6 GM/177ML SOLN Take 1 kit by mouth as directed. May use generic SUPREP;NO prior authorizations will be done.Please use Singlecare or GOOD-RX coupon. (Patient not taking: Reported on 10/18/2022) 354 mL 0   ofloxacin (FLOXIN) 0.3 % OTIC solution Place 4 drops into affected ears 2 (two) times daily. (DOS 10/11/22) (Patient not taking: Reported on 10/18/2022) 10 mL 0   omeprazole (PRILOSEC) 40 MG capsule Take  1 capsule Daily to Prevent Indigestion & Heart burn                                  /                              TAKE                            BY                        MOUTH 90 capsule 3   rosuvastatin (CRESTOR) 10 MG tablet TAKE 1 TABLET BY MOUTH  DAILY FOR CHOLESTEROL 90 tablet 3   Tetrahydroz-Glyc-Hyprom-PEG (VISINE MAXIMUM REDNESS RELIEF) 0.05-0.2-0.36-1 % SOLN Place 1-2 drops into both eyes 3 (three) times daily as needed (dry/irrtated eyes.).     valsartan (DIOVAN) 160 MG tablet Take  1 tablet  Daily  for BP & Diabetic Kidney Protection  /                               /                    TAKE                    BY             MOUTH 90 tablet 3   No current facility-administered medications on file prior to visit.    Allergies:   Allergies  Allergen Reactions   Cardura [Doxazosin Mesylate] Other (See Comments)    Nasal congestion   Codeine Other (See Comments)    The patient passed out   Hytrin [Terazosin] Other (See Comments)    Reaction not recalled    Physical Exam There were no vitals filed for this visit.  There is no height or weight on file to calculate BMI.    General: Mildly obese pleasant elderly Caucasian male seated, in no evident distress Head: head normocephalic and atraumatic.   Neck: supple with no carotid or supraclavicular  bruits Cardiovascular: regular rate and rhythm, no murmurs Musculoskeletal: no deformity.  Right frontal craniotomy scar from meningioma surgery Skin:  no rash/petichiae Vascular:  Normal pulses all extremities  Neurologic Exam Mental Status: Awake and fully alert. Oriented to place and time. Recent and remote memory intact. Attention span, concentration and fund of knowledge appropriate. Mood and affect appropriate.  Cranial Nerves: Pupils equal, briskly reactive to light. Extraocular movements full without nystagmus. Visual fields full to confrontation. Hearing intact.  Mild left lower facial asymmetry.  Sensation intact. Face, tongue, palate moves normally and symmetrically.  Motor: Normal bulk and tone. Normal strength in all  tested extremity muscles. Sensory.: intact to touch , pinprick , position and vibratory sensation.  Coordination: Rapid alternating movements normal in all extremities. Finger-to-nose and heel-to-shin performed accurately bilaterally. Gait and Station: Arises from chair without difficulty. Stance is normal. Gait demonstrates normal stride length and balance without use of assistive device.  Able to heel, toe and tandem walk without difficulty.  Reflexes: 1+ and symmetric. Toes downgoing.       ASSESSMENT/PLAN: 77 year old Caucasian male with solitary episode of simple partial seizure in February 2022 likely symptomatic from his right frontal meningioma surgery from May 2021.  EEG 01/12/2021 abnormal with mild irritability of the brain on the right side which may increase predisposition for seizures however no definite seizure activity noted.  Longstanding history of sleep apnea not on CPAP and has since established care with Dr. Brett Fairy and restarting CPAP on 08/31/2021     Simple partial seizure -Continue Keppra 500 mg twice daily for seizure prophylaxis - refill provided - discussed seizure provoking stimuli like sleep deprivation, medication noncompliance, irregular eating and sleeping habits and extremes of exertion and to avoid stimulants like alcohol, marijuana and substance abuse agents.    Complex sleep apnea -Suboptimal compliance although residual AHI 0.6.  Discussed increasing nightly usage to >4 hours per night.  Continue to follow with DME company for any needed supplies or CPAP related concerns -Started CPAP 08/31/2021    Follow-up in 1 year for both CPAP and seizure follow-up or call earlier if needed   No orders of the defined types were placed in this encounter.   No orders of the defined types were placed in this encounter.    CC:  Unk Pinto, MD    I spent 31 minutes of face-to-face and non-face-to-face time with patient.  This included previsit chart  review, lab review, study review, electronic health record documentation, patient education and discussion regarding complex sleep apnea and review and discussion of compliance report, hx of seizures likely from right frontal meningioma surgery and ongoing use of AED for seizure prevention, seizure triggers and importance of avoidance and answered all other questions to patient's satisfaction  Frann Rider, AGNP-BC  [facility] [address] [place], [place] [address]  Phone [phone] Fax [phone] Note: This document was prepared with digital dictation and possible smart phrase technology. Any transcriptional errors that result from this process are unintentional.

## 2022-11-07 ENCOUNTER — Ambulatory Visit: Payer: Medicare Other | Admitting: Adult Health

## 2022-11-13 NOTE — Progress Notes (Unsigned)
[facility] [address] [place]. [place] [address] [phone]       OFFICE FOLLOW UP NOTE  [name] Date of Birth:  12-May-1946 Medical Record Number:  [id number]   Referring MD: Lesleigh Noe  Reason for Referral: Seizure and CPAP  No chief complaint on file.      HPI:   Update 11/14/2022 JM: Patient returns for yearly CPAP and seizure visit.  Stable from seizure standpoint without any recent seizure activity.  Compliant on Keppra 500 mg BID, tolerating well.  Continues to maintain ADLs and IADLs independently as well as driving.  OSA on CPAP ***       History provided for reference purposes only Update 11/02/2021 JM: Patient returns for initial CPAP compliance visit unaccompanied.  Previously seen 4 months ago for seizure follow-up and has been stable from that aspect since prior visit.  Compliant on Keppra without side effects and no seizure activity.  Initiated CPAP on 08/31/2021 after completion of HST which showed severe REM sleep dominant sleep apnea, pursued titration study which confirmed complex sleep apnea by Dr. Brett Fairy.  Review of compliance report shows 27 out of 30 usage days at 90% and 13 days greater than 4 hours with average usage 3 hours and 47 minutes.  Residual AHI 0.6.  Leaks in the 95 percentile 4.4.  Pressure in the 95th percentile 9.0.  On AutoPap 6-13 with EPR 2.  Reports overall tolerating CPAP well with use of nasal pillows. He does report restless sleep, will have nocturia and at times will have to leave his bed to sleep in recliner. He typically does not sleep more than 4 hours per night.    Epworth Sleepiness Scale 4/24 Fatigue severity scale 33/63   Update 07/06/2021 JM: Returns for 53-month seizure follow-up unaccompanied.  Overall stable since prior visit without any reoccurring seizure activity.  Remains on Keppra 500 mg twice daily tolerating without side effects.  Continues to live with his wife -able to  maintain ADLs and IADLs independently.  He drives his personal car without difficulty.  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  No further concerns at this time.  Update 12/29/2020 JM: [name] returns for seizure follow-up after initial consult visit with Dr. Leonie Man 2 months ago.  He is unaccompanied at today's visit.  EEG 12/13/2020 did show mild irritability which increases predisposition of seizures and recommended continuation of Keppra.  He has remained on Keppra tolerating without side effects.  Denies any additional seizure activity.  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  He does report prior history of sleep apnea over 10 years ago previously using CPAP but he stopped using after weight loss.  He denies any witnessed apnea but does report snoring, occasional fatigue and nocturia.  He questions possible need of repeat sleep study.   Consult visit 10/25/2020 Dr. Leonie Man: [name] is a pleasant 77 year old Caucasian male seen today for initial office consultation visit for seizure.  History is obtained from the patient and review of electronic medical records and I personally reviewed pertinent imaging films in PACS.  He has past medical history of hypertension, hyperlipidemia, prediabetes, obstructive sleep apnea on CPAP, gout, anxiety who was recently diagnosed with large 7 x 2.9 x 3.1 cm right frontal convexity meningioma in May 2021.  At that time he presented with left leg pain and had a  fall.  Routine brain imaging showed a large right frontal meningioma.  He underwent gross total excision of the meningioma on 12/29/2019 by Dr. Ellene Route and did well postoperatively.  He was on Decadron which was tapered he was also given Keppra 500 mg twice daily which he took for 6 months and seizure prophylaxis and overall tolerated very well though his wife had noticed mild personality  change.  He did discontinue the Keppra.  He presented on 10/15/2020 with episode of involuntary movements on his jaw and difficulty controlling it after eating food.  This lasted only a couple of minutes.  Wife noticed that he was rhythmically chewing and could not stop it and was choking.  He had trouble speaking and try to speak but words did not come out.  He did not have any alteration of consciousness or any tongue bite.  There was no extremity tonic-clonic movements.  Patient had no obvious trigger for this episode.  He does have some habitual nocturia and wakes up every 2 3 hours but the patient states he does sleep 6 to 8 hours every night.  He has no prior history of head injury, seizures, stroke, TIA, headaches or any other neurological problems.  There is no family history of epilepsy.  Patient has since been restarted on Keppra 500 mg twice daily which is tolerating well without any side effects.  He had an EEG done on 10/15/2020 which was also normal.  MRI scan of the brain done on 10/15/2020 showed no residual meningioma tumor with postoperative changes of craniotomy in the right frontal region.  No acute abnormality.  Overall no significant change compared to previous MRI from September 2021.  Patient denies drinking alcohol ,smoking marijuana or using any drugs of abuse.  ROS:   14 system review of systems is positive for those listed in HPI and all other systems negative  PMH:  Past Medical History:  Diagnosis Date   Allergy    Anxiety    Cataract    beginning stage 01/22/2019   Chronic kidney disease    enlarge bladder   Diabetes mellitus without complication (HCC)    Type 2   GERD (gastroesophageal reflux disease)    Gout    HOH (hard of hearing)    bilateral, no aids   Hyperlipidemia    Hypertension    OSA (obstructive sleep apnea)    uses CPAP   Seizures (HCC)    last 3 years ago in MAY/ benign brain tumor   Sleep apnea    c pap   Type 2 diabetes mellitus with stage 3  chronic kidney disease, without long-term current use of insulin ([place]) 08/17/2013   Urinary frequency     Social History:  Social History   Socioeconomic History   Marital status: Married    Spouse name: Not on file   Number of children: Not on file   Years of education: Not on file   Highest education level: Not on file  Occupational History   Occupation: Recruitment consultant    Comment: on STD 10/25/20  Tobacco Use   Smoking status: Never   Smokeless tobacco: Never  Vaping Use   Vaping Use: Never used  Substance and Sexual Activity   Alcohol use: No   Drug use: No   Sexual activity: Not on file  Other Topics Concern   Not on file  Social History Narrative   Lives with wife   Right handed   Drinks 3-4 cups caffeine daily  Social Determinants of Health   Financial Resource Strain: Not on file  Food Insecurity: No Food Insecurity (11/08/2021)   Hunger Vital Sign    Worried About Running Out of Food in the Last Year: Never true    Ran Out of Food in the Last Year: Never true  Transportation Needs: No Transportation Needs (11/08/2021)   PRAPARE - Hydrologist (Medical): No    Lack of Transportation (Non-Medical): No  Physical Activity: Not on file  Stress: Not on file  Social Connections: Not on file  Intimate Partner Violence: Not on file    Medications:   Current Outpatient Medications on File Prior to Visit  Medication Sig Dispense Refill   acetaminophen (TYLENOL) 650 MG CR tablet Take 650 mg by mouth every 8 (eight) hours as needed for pain (or headaches).     albuterol (VENTOLIN HFA) 108 (90 Base) MCG/ACT inhaler Inhale into the lungs every 6 (six) hours as needed for wheezing or shortness of breath.     Cholecalciferol (VITAMIN D3) 10000 units capsule Take 10,000 Units by mouth in the morning.     citalopram (CELEXA) 40 MG tablet Take  1 tablet  Daily  for Mood & Chronic Anxiety 90 tablet 3   Dulaglutide (TRULICITY) 4.5 0000000 SOPN Inject   4.5 mg  into Skin  every 7 days  for Diabetes  (Dx: e11.29 ) (Patient taking differently: Inject 4.5 mg into the skin every Saturday. Inject  4.5 mg  into Skin  every 7 days  for Diabetes  (Dx: e11.29 )) 6 mL 3   fenofibrate 160 MG tablet Take 1 tablet  Daily for Triglycerides (Blood Fats)                                     /                      TAKE                            BY                        MOUTH (Patient taking differently: Take 160 mg by mouth every evening.) 90 tablet 3   fluticasone (FLONASE) 50 MCG/ACT nasal spray Place 1-2 sprays into both nostrils daily as needed for allergies or rhinitis.     glipiZIDE (GLUCOTROL) 5 MG tablet Take  1 to 2 tablets  3 x /day  with Meals  for Diabetes                                                               /                                           TAKE  BY                                    MOUTH (Patient taking differently: Takes two tablet in the morning and two tablets in the evening) 540 tablet 3   glucose blood (FREESTYLE LITE) test strip Check blood sugar 1 time daily-DX-E11.22 100 each 5   levETIRAcetam (KEPPRA) 500 MG tablet Take 1 tablet (500 mg total) by mouth 2 (two) times daily. 180 tablet 3   metFORMIN (GLUCOPHAGE) 500 MG tablet TAKE 1 TABLET BY MOUTH 3 TIMES  DAILY (Patient taking differently: Take 500 mg by mouth in the morning and at bedtime.) 270 tablet 3   Na Sulfate-K Sulfate-Mg Sulf 17.5-3.13-1.6 GM/177ML SOLN Take 1 kit by mouth as directed. May use generic SUPREP;NO prior authorizations will be done.Please use Singlecare or GOOD-RX coupon. (Patient not taking: Reported on 10/18/2022) 354 mL 0   ofloxacin (FLOXIN) 0.3 % OTIC solution Place 4 drops into affected ears 2 (two) times daily. (DOS 10/11/22) (Patient not taking: Reported on 10/18/2022) 10 mL 0   omeprazole (PRILOSEC) 40 MG capsule Take  1 capsule Daily to Prevent Indigestion & Heart burn                                  /                              TAKE                            BY                        MOUTH 90 capsule 3   rosuvastatin (CRESTOR) 10 MG tablet TAKE 1 TABLET BY MOUTH  DAILY FOR CHOLESTEROL 90 tablet 3   Tetrahydroz-Glyc-Hyprom-PEG (VISINE MAXIMUM REDNESS RELIEF) 0.05-0.2-0.36-1 % SOLN Place 1-2 drops into both eyes 3 (three) times daily as needed (dry/irrtated eyes.).     valsartan (DIOVAN) 160 MG tablet Take  1 tablet  Daily  for BP & Diabetic Kidney Protection  /                               /                    TAKE                    BY             MOUTH 90 tablet 3   No current facility-administered medications on file prior to visit.    Allergies:   Allergies  Allergen Reactions   Cardura [Doxazosin Mesylate] Other (See Comments)    Nasal congestion   Codeine Other (See Comments)    The patient passed out   Hytrin [Terazosin] Other (See Comments)    Reaction not recalled    Physical Exam There were no vitals filed for this visit.  There is no height or weight on file to calculate BMI.    General: Mildly obese pleasant elderly Caucasian male seated, in no evident distress Head: head normocephalic and atraumatic.   Neck: supple with no carotid or supraclavicular  bruits Cardiovascular: regular rate and rhythm, no murmurs Musculoskeletal: no deformity.  Right frontal craniotomy scar from meningioma surgery Skin:  no rash/petichiae Vascular:  Normal pulses all extremities  Neurologic Exam Mental Status: Awake and fully alert. Oriented to place and time. Recent and remote memory intact. Attention span, concentration and fund of knowledge appropriate. Mood and affect appropriate.  Cranial Nerves: Pupils equal, briskly reactive to light. Extraocular movements full without nystagmus. Visual fields full to confrontation. Hearing intact.  Mild left lower facial asymmetry.  Sensation intact. Face, tongue, palate moves normally and symmetrically.  Motor: Normal bulk and tone. Normal strength in all  tested extremity muscles. Sensory.: intact to touch , pinprick , position and vibratory sensation.  Coordination: Rapid alternating movements normal in all extremities. Finger-to-nose and heel-to-shin performed accurately bilaterally. Gait and Station: Arises from chair without difficulty. Stance is normal. Gait demonstrates normal stride length and balance without use of assistive device.  Able to heel, toe and tandem walk without difficulty.  Reflexes: 1+ and symmetric. Toes downgoing.       ASSESSMENT/PLAN: 76 year old Caucasian male with solitary episode of simple partial seizure in February 2022 likely symptomatic from his right frontal meningioma surgery from May 2021.  EEG 01/12/2021 abnormal with mild irritability of the brain on the right side which may increase predisposition for seizures however no definite seizure activity noted.  Longstanding history of sleep apnea not on CPAP and has since established care with Dr. Brett Fairy and restarting CPAP on 08/31/2021     Simple partial seizure -Continue Keppra 500 mg twice daily for seizure prophylaxis - refill provided - discussed seizure provoking stimuli like sleep deprivation, medication noncompliance, irregular eating and sleeping habits and extremes of exertion and to avoid stimulants like alcohol, marijuana and substance abuse agents.    Complex sleep apnea -Suboptimal compliance although residual AHI 0.6.  Discussed increasing nightly usage to >4 hours per night.  Continue to follow with DME company for any needed supplies or CPAP related concerns -Started CPAP 08/31/2021    Follow-up in 1 year for both CPAP and seizure follow-up or call earlier if needed   No orders of the defined types were placed in this encounter.   No orders of the defined types were placed in this encounter.    CC:  Unk Pinto, MD    I spent 31 minutes of face-to-face and non-face-to-face time with patient.  This included previsit chart  review, lab review, study review, electronic health record documentation, patient education and discussion regarding complex sleep apnea and review and discussion of compliance report, hx of seizures likely from right frontal meningioma surgery and ongoing use of AED for seizure prevention, seizure triggers and importance of avoidance and answered all other questions to patient's satisfaction  Frann Rider, AGNP-BC  [facility] [address] [place], [place] [address]  Phone [phone] Fax [phone] Note: This document was prepared with digital dictation and possible smart phrase technology. Any transcriptional errors that result from this process are unintentional.

## 2022-11-14 ENCOUNTER — Encounter: Payer: Self-pay | Admitting: Adult Health

## 2022-11-14 ENCOUNTER — Ambulatory Visit (INDEPENDENT_AMBULATORY_CARE_PROVIDER_SITE_OTHER): Payer: Medicare Other | Admitting: Adult Health

## 2022-11-14 VITALS — BP 120/77 | HR 66 | Ht 70.0 in | Wt 220.0 lb

## 2022-11-14 DIAGNOSIS — Z9989 Dependence on other enabling machines and devices: Secondary | ICD-10-CM

## 2022-11-14 DIAGNOSIS — G4731 Primary central sleep apnea: Secondary | ICD-10-CM

## 2022-11-14 DIAGNOSIS — G40909 Epilepsy, unspecified, not intractable, without status epilepticus: Secondary | ICD-10-CM | POA: Diagnosis not present

## 2022-11-14 MED ORDER — LEVETIRACETAM 500 MG PO TABS: 500.0000 mg | ORAL_TABLET | Freq: Two times a day (BID) | ORAL | 3 refills | Status: DC

## 2022-11-14 NOTE — Patient Instructions (Addendum)
Your Plan:  Continue Keppra 500 mg twice daily for seizure prevention  Highly recommend restarting CPAP therapy to ensure adequate sleep apnea management    Follow-up in 1 year or call earlier if needed     Thank you for coming to see [place] at [facility]. I hope we have been able to provide you high quality care today.  You may receive a patient satisfaction survey over the next few weeks. We would appreciate your feedback and comments so that we may continue to improve ourselves and the health of our patients.

## 2022-11-15 ENCOUNTER — Telehealth: Payer: Self-pay

## 2022-11-21 ENCOUNTER — Ambulatory Visit: Payer: Medicare Other | Admitting: Adult Health

## 2022-12-12 ENCOUNTER — Telehealth: Payer: Self-pay

## 2022-12-12 DIAGNOSIS — E1122 Type 2 diabetes mellitus with diabetic chronic kidney disease: Secondary | ICD-10-CM

## 2022-12-12 NOTE — Telephone Encounter (Signed)
Pt unable to get Trulicity. Requesting an alternative.

## 2022-12-14 MED ORDER — SEMAGLUTIDE (1 MG/DOSE) 4 MG/3ML ~~LOC~~ SOPN: 1.0000 mg | PEN_INJECTOR | SUBCUTANEOUS | 3 refills | Status: DC

## 2022-12-14 NOTE — Telephone Encounter (Signed)
Ozempic sent to pharmacy. PA indicated.

## 2022-12-15 NOTE — Telephone Encounter (Signed)
Pt contacted and advised sample available until PA completed.

## 2022-12-18 ENCOUNTER — Other Ambulatory Visit (HOSPITAL_COMMUNITY): Payer: Self-pay

## 2022-12-18 ENCOUNTER — Telehealth: Payer: Self-pay

## 2022-12-18 DIAGNOSIS — E1122 Type 2 diabetes mellitus with diabetic chronic kidney disease: Secondary | ICD-10-CM

## 2022-12-18 NOTE — Telephone Encounter (Signed)
Patient Advocate Encounter   Received notification from pt msgs that prior authorization is required for Ozempic (1 MG/DOSE) 4MG /3ML pen-injectors  Submitted: 12/18/22 Key [id number]  Status is pending

## 2022-12-19 NOTE — Telephone Encounter (Signed)
Pharmacy Patient Advocate Encounter  Received notification that the request for prior authorization has been denied due to:

## 2022-12-20 MED ORDER — BYDUREON BCISE 2 MG/0.85ML ~~LOC~~ AUIJ: 2.0000 mg | AUTO-INJECTOR | SUBCUTANEOUS | 3 refills | Status: DC

## 2022-12-20 NOTE — Telephone Encounter (Signed)
T, if he agree, we can try Bydureon 2 mg weekly. Can send 4 pens and see how he does on it. It is in the same class, but needle is thicker. Ty! C

## 2022-12-20 NOTE — Addendum Note (Signed)
Addended by: Kenyon Ana on: 12/20/2022 04:30 PM   Modules accepted: Orders

## 2022-12-21 ENCOUNTER — Other Ambulatory Visit (HOSPITAL_COMMUNITY): Payer: Self-pay

## 2022-12-21 NOTE — Telephone Encounter (Signed)
Per test claim: Bydureon is refill too soon  PA not submitted at this time

## 2022-12-24 ENCOUNTER — Other Ambulatory Visit: Payer: Self-pay | Admitting: Adult Health

## 2023-01-16 ENCOUNTER — Ambulatory Visit: Payer: Medicare Other | Admitting: Nurse Practitioner

## 2023-01-16 NOTE — Progress Notes (Deleted)
MEDICARE ANNUAL WELLNESS VISIT AND FOLLOW UP Assessment:   Medicare annual wellness visit Due Annually Health Maintenance Reviewed Healthy lifestyle reviewed and goals set  Essential hypertension Continue Valsartan  Discussed DASH (Dietary Approaches to Stop Hypertension) DASH diet is lower in sodium than a typical American diet. Cut back on foods that are high in saturated fat, cholesterol, and trans fats. Eat more whole-grain foods, fish, poultry, and nuts Remain active and exercise as tolerated daily.  Monitor BP at home-Call if greater than 130/80.  Check CMP/CBC  OSA (obstructive sleep apnea) Continue sleep hygiene Monitor weight Continue to follow with Dr. Vickey Huger, getting new CPAP***  Type II diabetes mellitus with stage 3b CKD Continue metformin 500 mg BID; glipizide 5 mg BID, Trulicity 4.5 mg SQ QW Education: Reviewed 'ABCs' of diabetes management  Discussed goals to be met and/or maintained include A1C (<7) Blood pressure (<130/80) Cholesterol (LDL <70) Continue Eye Exam yearly  Continue Dental Exam Q6 mo Discussed dietary recommendations Discussed Physical Activity recommendations Check A1C  CKD 3 d/t DM Discussed how what you eat and drink can aide in kidney protection. Stay well hydrated. Avoid high salt foods. Avoid NSAIDS. Keep BP and BG well controlled.   Take medications as prescribed. Remain active and exercise as tolerated daily. Maintain weight.  Continue to monitor. Check CMP/GFR/Microablumin  BPH/Prostatism Continue medications. Urology following    Hyperlipidemia associated with Type 2 Diabetes Mellitus(HCC) Discussed lifestyle modifications. Recommended diet heavy in fruits and veggies, omega 3's. Decrease consumption of animal meats, cheeses, and dairy products. Remain active and exercise as tolerated. Continue to monitor. Check lipids/TSH   Idiopathic gout, unspecified chronicity, unspecified site Low purine diet  discussed Monitor uric acid levels Continue Allopurinol   Vitamin D deficiency Continue supplement for goal of 60-100 Monitor Vitamin D levels  Seizures Controlled on Keppra 500mg  BID No recent seizure activity, continue to monitor Continue to follow with Dr Pearlean Brownie  Obesity - BMI 30-34 Discussed appropriate BMI Diet modification. Physical activity. Encouraged/praised to build confidence.  Abnormal Thyroid Blood Test Monitor TSH levels  Anxiety Continue Celexa 20 mg daily Reviewed relaxation techniques.  Sleep hygiene. Recommended Cognitive Behavioral Therapy (CBT). Recommended mindfulness meditation and exercise.   Encouraged personality growth wand development through coping techniques and problem-solving skills. Limit/Decrease/Monitor drug/alcohol intake.     Cerebral Meningioma Continue to follow with Dr Danielle Dess and MRI  Vitamin D deficiency Continue supplement for goal of 60-100 Monitor Vitamin D levels  Medication Management All medications discussed and reviewed in full. All questions and concerns regarding medications addressed.     Future Appointments  Date Time Provider Department Center  01/16/2023 11:00 AM Adela Glimpse, NP [facility] None  03/30/2023 10:00 AM Lucky Cowboy, MD [facility] None  04/12/2023  1:40 PM Carlus Pavlov, MD [facility] None  11/14/2023  8:15 AM Ihor Austin, NP [facility] None    Plan:   During the course of the visit the patient was educated and counseled about appropriate screening and preventive services including:   Pneumococcal vaccine  Influenza vaccine Prevnar 13 Td vaccine Screening electrocardiogram Colorectal cancer screening Diabetes screening Glaucoma screening Nutrition counseling    Subjective:  Jeffery Reed is a 77 y.o. male who presents for Medicare Annual Wellness Visit and 3 month follow up for HTN, hyperlipidemia, T2 diabetes, and vitamin D Def.   He has a diagnosis of anxiety and is  currently taking Celexa 40 mg QD. Symptoms have improved since increasing his medication to 40 mg QD.   He has  BPH/prostatism followed by urology. He gets up 3 times a night to urinate.  BMI is There is no height or weight on file to calculate BMI., He does do work around house and yard work no other exercise. Does watch saturated fats and simple carbs Wt Readings from Last 3 Encounters:  11/14/22 220 lb (99.8 kg)  10/18/22 216 lb 6.4 oz (98.2 kg)  10/12/22 220 lb (99.8 kg)   His blood pressure has been controlled at home, today their BP is   BP Readings from Last 3 Encounters:  11/14/22 120/77  10/18/22 120/70  10/12/22 128/80    He does not workout. He denies chest pain, shortness of breath, dizziness.   He is on cholesterol medication (rosuvastatin 10 mg daily) and denies myalgias. His cholesterol is not at goal. The cholesterol last visit was:   Lab Results  Component Value Date   CHOL 155 10/18/2022   HDL 33 (L) 10/18/2022   LDLCALC 93 10/18/2022   TRIG 191 (H) 10/18/2022   CHOLHDL 4.7 10/18/2022   He has been working on diet and exercise for diabetes with CKD, and denies foot ulcerations, hyperglycemia, hypoglycemia , increased appetite, nausea, paresthesia of the feet, polydipsia, polyuria, visual disturbances, vomiting and weight loss. He checks random fasting glucoses; has been running 200-250 since his mother passed for the past month, currently on metformin 500 mg BID and glipizide 5 mg 2 tabs twice daily, Trulicity 4.5 mg SQ QW.  Blood sugars fasting running 120's. Last A1C in the office was:   Lab Results  Component Value Date   HGBA1C 6.0 (H) 10/18/2022   He does not get enough water a day. Last GFR Lab Results  Component Value Date   EGFR 41 (L) 10/18/2022     Patient is on Vitamin D supplement, taking 16109 IU, taking in AM with PPI Lab Results  Component Value Date   VD25OH 53 10/18/2022       Medication Review: Current Outpatient Medications on File  Prior to Visit  Medication Sig Dispense Refill   acetaminophen (TYLENOL) 650 MG CR tablet Take 650 mg by mouth every 8 (eight) hours as needed for pain (or headaches).     albuterol (VENTOLIN HFA) 108 (90 Base) MCG/ACT inhaler Inhale into the lungs every 6 (six) hours as needed for wheezing or shortness of breath.     Cholecalciferol (VITAMIN D3) 10000 units capsule Take 10,000 Units by mouth in the morning.     citalopram (CELEXA) 40 MG tablet Take  1 tablet  Daily  for Mood & Chronic Anxiety 90 tablet 3   Exenatide ER (BYDUREON BCISE) 2 MG/0.85ML AUIJ Inject 2 mg into the skin once a week. 3.4 mL 3   fenofibrate 160 MG tablet Take 1 tablet  Daily for Triglycerides (Blood Fats)                                     /                      TAKE                            BY                        MOUTH (Patient taking differently: Take 160 mg by mouth every  evening.) 90 tablet 3   fluticasone (FLONASE) 50 MCG/ACT nasal spray Place 1-2 sprays into both nostrils daily as needed for allergies or rhinitis.     glipiZIDE (GLUCOTROL) 5 MG tablet Take  1 to 2 tablets  3 x /day  with Meals  for Diabetes                                                               /                                           TAKE                                    BY                                    MOUTH (Patient taking differently: Takes two tablet in the morning and two tablets in the evening) 540 tablet 3   glucose blood (FREESTYLE LITE) test strip Check blood sugar 1 time daily-[id number] 100 each 5   levETIRAcetam (KEPPRA) 500 MG tablet TAKE 1 TABLET BY MOUTH TWICE  DAILY 180 tablet 3   metFORMIN (GLUCOPHAGE) 500 MG tablet TAKE 1 TABLET BY MOUTH 3 TIMES  DAILY (Patient taking differently: Take 500 mg by mouth in the morning and at bedtime.) 270 tablet 3   Na Sulfate-K Sulfate-Mg Sulf 17.5-3.13-1.6 GM/177ML SOLN Take 1 kit by mouth as directed. May use generic SUPREP;NO prior authorizations will be done.Please use Singlecare  or GOOD-RX coupon. 354 mL 0   ofloxacin (FLOXIN) 0.3 % OTIC solution Place 4 drops into affected ears 2 (two) times daily. (DOS 10/11/22) 10 mL 0   omeprazole (PRILOSEC) 40 MG capsule Take  1 capsule Daily to Prevent Indigestion & Heart burn                                  /                             TAKE                            BY                        MOUTH 90 capsule 3   rosuvastatin (CRESTOR) 10 MG tablet TAKE 1 TABLET BY MOUTH  DAILY FOR CHOLESTEROL 90 tablet 3   Tetrahydroz-Glyc-Hyprom-PEG (VISINE MAXIMUM REDNESS RELIEF) 0.05-0.2-0.36-1 % SOLN Place 1-2 drops into both eyes 3 (three) times daily as needed (dry/irrtated eyes.).     valsartan (DIOVAN) 160 MG tablet Take  1 tablet  Daily  for BP & Diabetic Kidney Protection  /                               /  TAKE                    BY             MOUTH 90 tablet 3   No current facility-administered medications on file prior to visit.    Current Problems (verified) Patient Active Problem List   Diagnosis Date Noted   Personal history of colonic polyps    Benign neoplasm of cecum    Meningioma, cerebral (HCC) 03/09/2022   At risk for central sleep apnea 06/10/2021   Loud snoring 03/08/2021   History of sleep apnea 03/08/2021   Hx of resection of meningioma 03/08/2021   Retrognathia 03/08/2021   Seizure-like activity (HCC) 10/15/2020   Acute-on-chronic kidney injury (HCC) 10/15/2020   Seizures (HCC) 10/14/2020   Renal mass 01/08/2020   Noncompliance with CPAP treatment    Stage 3 chronic kidney disease (HCC)    Steroid-induced hyperglycemia    Enlarged prostate    CKD stage 3 due to type 2 diabetes mellitus (HCC) 03/12/2019   Anxiety 02/18/2018   GERD (gastroesophageal reflux disease) 03/12/2016   Obesity (BMI 30.0-34.9) 06/01/2015   Medication management 08/19/2014   BPH/Prostatism 08/19/2014   Essential hypertension 08/17/2013   Type 2 diabetes mellitus with stage 3 chronic kidney disease, without  long-term current use of insulin (HCC) 08/17/2013   Vitamin D deficiency 08/17/2013   Hyperlipidemia associated with type 2 diabetes mellitus (HCC)    Testosterone deficiency    Gout    OSA (obstructive sleep apnea)     Screening Tests Immunization History  Administered Date(s) Administered   DT (Pediatric) 06/01/2015   Fluad Quad(high Dose 65+) 05/20/2020   Influenza Whole 06/05/2013   Influenza, High Dose Seasonal PF 06/05/2014, 06/01/2015, 06/14/2016, 06/22/2017, 06/11/2018, 05/13/2019, 05/17/2021, 07/05/2022   PFIZER(Purple Top)SARS-COV-2 Vaccination 09/27/2019, 10/20/2019   Pneumococcal Conjugate-13 08/19/2014   Pneumococcal Polysaccharide-23 08/22/2003, 12/10/2015   Tdap 03/08/2004   Zoster, Live 08/18/2013   Preventative care: Last colonoscopy: 01/2019, 3 polyps removed, 05/04/22 due 2026 Ct head 2010 CXR 2010  Prior vaccinations: TD or Tdap: 2016  Influenza: 2022  Pneumococcal: 2017 Prevnar13: 2015 Shingles/Zostavax: 2014  Names of Other Physician/Practitioners you currently use: 1. [facility] here for primary care 2. [facility], eye doctor, last visit 09/10/2020 Patient Care Team: Lucky Cowboy, MD as PCP - General (Internal Medicine) Marcine Matar, MD as Consulting Physician (Urology) Camille Bal, MD as Consulting Physician (Nephrology) Lajoyce Corners, [facility] (Inactive) as Pharmacist (Pharmacist) Ihor Austin, NP as Nurse Practitioner (Neurology) Micki Riley, MD as Referring Physician (Neurology)  Allergies Allergies  Allergen Reactions   Cardura [Doxazosin Mesylate] Other (See Comments)    Nasal congestion   Codeine Other (See Comments)    The patient passed out   Hytrin [Terazosin] Other (See Comments)    Reaction not recalled    SURGICAL HISTORY He  has a past surgical history that includes Foot surgery (10/2009); Tonsillectomy (1953); Myringotomy (Left, 02/2005); Colonoscopy (2020);  Craniotomy (Right, 12/29/2019); Colonoscopy with propofol (N/A, 05/04/2022); polypectomy (05/04/2022); and Myringotomy with tube placement (Left, 10/11/2022). FAMILY HISTORY His family history includes Diabetes in his maternal grandmother and paternal grandmother; GER disease in his mother; Hyperlipidemia in his father; Hypertension in his brother, father, and mother; Parkinson's disease in his father; Stroke in his paternal grandfather and paternal grandmother. SOCIAL HISTORY He  reports that he has never smoked. He has never used smokeless tobacco. He reports that he does not drink alcohol and does  not use drugs.  MEDICARE WELLNESS OBJECTIVES: Physical activity:   Cardiac risk factors:   Depression/mood screen:      10/18/2022    9:19 PM  Depression screen PHQ 2/9  Decreased Interest 0  Down, Depressed, Hopeless 0  PHQ - 2 Score 0    ADLs:     10/18/2022    9:18 PM 10/11/2022    9:20 AM  In your present state of health, do you have any difficulty performing the following activities:  Hearing? 0 0  Vision? 0 0  Difficulty concentrating or making decisions? 0 0  Walking or climbing stairs? 0 0  Dressing or bathing? 0 0  Doing errands, shopping? 0      Cognitive Testing  Alert? Yes  Normal Appearance?Yes  Oriented to person? Yes  Place? Yes   Time? Yes  Recall of three objects?  Yes  Can perform simple calculations? Yes  Displays appropriate judgment?Yes  Can read the correct time from a watch face?Yes  EOL planning:   Review of Systems  Constitutional:  Negative for chills, fever and weight loss.  HENT:  Negative for congestion and hearing loss.   Eyes:  Negative for blurred vision and double vision.  Respiratory:  Negative for cough and shortness of breath.   Cardiovascular:  Negative for chest pain, palpitations, orthopnea and leg swelling.  Gastrointestinal:  Negative for abdominal pain, constipation, diarrhea, heartburn, nausea and vomiting.  Genitourinary:  Negative  for dysuria.  Musculoskeletal:  Negative for falls, joint pain and myalgias.  Skin:  Negative for rash.  Neurological:  Negative for dizziness, tingling, tremors, loss of consciousness and headaches.  Endo/Heme/Allergies:  Does not bruise/bleed easily.  Psychiatric/Behavioral:  Negative for depression, memory loss and suicidal ideas.      Objective:   There were no vitals filed for this visit.   There is no height or weight on file to calculate BMI. General Appearance: Well nourished, in no apparent distress. Eyes: PERRLA, EOMs, conjunctiva no swelling or erythema Sinuses: No Frontal/maxillary tenderness ENT/Mouth: Ext aud canals clear, TMs without erythema, bulging. No erythema, swelling, or exudate on post pharynx.  Tonsils not swollen or erythematous. Hearing normal.  Neck: Supple, thyroid normal.  Respiratory: Respiratory effort normal, BS equal bilaterally without rales, rhonchi, wheezing or stridor.  Cardio: RRR with no MRGs. Brisk peripheral pulses without edema.  Abdomen: Soft, + BS.  Non tender, no guarding, rebound, hernias, masses. Lymphatics: Non tender without lymphadenopathy.  Musculoskeletal: Full ROM, 5/5 strength, normal gait.  Skin: Warm, dry without rashes, lesions, ecchymosis.  Neuro: Cranial nerves intact. Normal muscle tone, no cerebellar symptoms. Sensation intact.  Psych: Awake and oriented X 3, normal affect, Insight and Judgment appropriate    Medicare Attestation I have personally reviewed: The patient's medical and social history Their use of alcohol, tobacco or illicit drugs Their current medications and supplements The patient's functional ability including ADLs,fall risks, home safety risks, cognitive, and hearing and visual impairment Diet and physical activities Evidence for depression or mood disorders  The patient's weight, height, BMI, and visual acuity have been recorded in the chart.  I have made referrals, counseling, and provided education  to the patient based on review of the above and I have provided the patient with a written personalized care plan for preventive services.     Adela Glimpse, NP   01/16/2023

## 2023-02-03 ENCOUNTER — Other Ambulatory Visit: Payer: Self-pay | Admitting: Internal Medicine

## 2023-02-03 DIAGNOSIS — E782 Mixed hyperlipidemia: Secondary | ICD-10-CM

## 2023-02-03 DIAGNOSIS — K219 Gastro-esophageal reflux disease without esophagitis: Secondary | ICD-10-CM

## 2023-02-13 ENCOUNTER — Encounter: Payer: Self-pay | Admitting: Nurse Practitioner

## 2023-02-13 ENCOUNTER — Ambulatory Visit (INDEPENDENT_AMBULATORY_CARE_PROVIDER_SITE_OTHER): Payer: Medicare Other | Admitting: Nurse Practitioner

## 2023-02-13 VITALS — BP 140/88 | HR 73 | Temp 97.8°F | Ht 70.0 in | Wt 220.0 lb

## 2023-02-13 DIAGNOSIS — E66811 Obesity, class 1: Secondary | ICD-10-CM

## 2023-02-13 DIAGNOSIS — N401 Enlarged prostate with lower urinary tract symptoms: Secondary | ICD-10-CM | POA: Diagnosis not present

## 2023-02-13 DIAGNOSIS — G4733 Obstructive sleep apnea (adult) (pediatric): Secondary | ICD-10-CM

## 2023-02-13 DIAGNOSIS — E782 Mixed hyperlipidemia: Secondary | ICD-10-CM

## 2023-02-13 DIAGNOSIS — R569 Unspecified convulsions: Secondary | ICD-10-CM | POA: Diagnosis not present

## 2023-02-13 DIAGNOSIS — E1122 Type 2 diabetes mellitus with diabetic chronic kidney disease: Secondary | ICD-10-CM

## 2023-02-13 DIAGNOSIS — N1832 Chronic kidney disease, stage 3b: Secondary | ICD-10-CM | POA: Diagnosis not present

## 2023-02-13 DIAGNOSIS — Z0000 Encounter for general adult medical examination without abnormal findings: Secondary | ICD-10-CM

## 2023-02-13 DIAGNOSIS — F419 Anxiety disorder, unspecified: Secondary | ICD-10-CM

## 2023-02-13 DIAGNOSIS — E669 Obesity, unspecified: Secondary | ICD-10-CM | POA: Diagnosis not present

## 2023-02-13 DIAGNOSIS — N138 Other obstructive and reflux uropathy: Secondary | ICD-10-CM

## 2023-02-13 DIAGNOSIS — N183 Chronic kidney disease, stage 3 unspecified: Secondary | ICD-10-CM

## 2023-02-13 DIAGNOSIS — I10 Essential (primary) hypertension: Secondary | ICD-10-CM

## 2023-02-13 DIAGNOSIS — Z0001 Encounter for general adult medical examination with abnormal findings: Secondary | ICD-10-CM

## 2023-02-13 DIAGNOSIS — M1A00X0 Idiopathic chronic gout, unspecified site, without tophus (tophi): Secondary | ICD-10-CM

## 2023-02-13 DIAGNOSIS — D320 Benign neoplasm of cerebral meninges: Secondary | ICD-10-CM

## 2023-02-13 DIAGNOSIS — Z79899 Other long term (current) drug therapy: Secondary | ICD-10-CM

## 2023-02-13 DIAGNOSIS — E559 Vitamin D deficiency, unspecified: Secondary | ICD-10-CM

## 2023-02-13 DIAGNOSIS — R6889 Other general symptoms and signs: Secondary | ICD-10-CM

## 2023-02-13 DIAGNOSIS — R7989 Other specified abnormal findings of blood chemistry: Secondary | ICD-10-CM | POA: Diagnosis not present

## 2023-02-13 NOTE — Progress Notes (Signed)
MEDICARE ANNUAL WELLNESS VISIT AND FOLLOW UP Assessment:   Medicare annual wellness visit Due Annually Health Maintenance Reviewed Healthy lifestyle reviewed and goals set  Essential hypertension Continue Valsartan  Discussed DASH (Dietary Approaches to Stop Hypertension) DASH diet is lower in sodium than a typical American diet. Cut back on foods that are high in saturated fat, cholesterol, and trans fats. Eat more whole-grain foods, fish, poultry, and nuts Remain active and exercise as tolerated daily.  Monitor BP at home-Call if greater than 130/80.  Check CMP/CBC  OSA (obstructive sleep apnea) Continue sleep hygiene Monitor weight Continue to follow with Dr. Vickey Huger  Type II diabetes mellitus with stage 3b CKD Continue metformin 500 mg BID; glipizide 5 mg BID, Trulicity 4.5 mg SQ QW Education: Reviewed 'ABCs' of diabetes management  Discussed goals to be met and/or maintained include A1C (<7) Blood pressure (<130/80) Cholesterol (LDL <70) Continue Eye Exam yearly  Continue Dental Exam Q6 mo Discussed dietary recommendations Discussed Physical Activity recommendations Check A1C  CKD 3 d/t DM Discussed how what you eat and drink can aide in kidney protection. Stay well hydrated. Avoid high salt foods. Avoid NSAIDS. Keep BP and BG well controlled.   Take medications as prescribed. Remain active and exercise as tolerated daily. Maintain weight.  Continue to monitor. Check CMP/GFR/Microablumin  BPH/Prostatism Continue medications. Urology following    Hyperlipidemia associated with Type 2 Diabetes Mellitus(HCC) Discussed lifestyle modifications. Recommended diet heavy in fruits and veggies, omega 3's. Decrease consumption of animal meats, cheeses, and dairy products. Remain active and exercise as tolerated. Continue to monitor. Check lipids/TSH   Idiopathic gout, unspecified chronicity, unspecified site Low purine diet discussed Monitor uric acid  levels Continue Allopurinol   Vitamin D deficiency Continue supplement for goal of 60-100 Monitor Vitamin D levels  Seizures Controlled on Keppra 500mg  BID No recent seizure activity, continue to monitor Continue to follow with Dr Pearlean Brownie  Obesity - BMI 30-34 Discussed appropriate BMI Diet modification. Physical activity. Encouraged/praised to build confidence.  Abnormal Thyroid Blood Test Monitor TSH levels  Anxiety Continue Celexa 20 mg daily Reviewed relaxation techniques.  Sleep hygiene. Recommended Cognitive Behavioral Therapy (CBT). Recommended mindfulness meditation and exercise.   Encouraged personality growth wand development through coping techniques and problem-solving skills. Limit/Decrease/Monitor drug/alcohol intake.     Cerebral Meningioma Continue to follow with Dr Danielle Dess and MRI  Medication Management All medications discussed and reviewed in full. All questions and concerns regarding medications addressed.    Orders Placed This Encounter  Procedures   CBC with Differential/Platelet   COMPLETE METABOLIC PANEL WITH GFR   Lipid panel   TSH   Hemoglobin A1c   VITAMIN D 25 Hydroxy (Vit-D Deficiency, Fractures)    Notify office for further evaluation and treatment, questions or concerns if any reported s/s fail to improve.   The patient was advised to call back or seek an in-person evaluation if any symptoms worsen or if the condition fails to improve as anticipated.   Further disposition pending results of labs. Discussed med's effects and SE's.    I discussed the assessment and treatment plan with the patient. The patient was provided an opportunity to ask questions and all were answered. The patient agreed with the plan and demonstrated an understanding of the instructions.  Discussed med's effects and SE's. Screening labs and tests as requested with regular follow-up as recommended.  I provided 35 minutes of face-to-face time during this encounter  including counseling, chart review, and critical decision making was preformed.  Today's Plan of Care is based on a patient-centered health care approach known as shared decision making - the decisions, tests and treatments allow for patient preferences and values to be balanced with clinical evidence.      Future Appointments  Date Time Provider Department Center  04/12/2023  1:40 PM Carlus Pavlov, MD [id number] None  05/29/2023  3:00 PM Lucky Cowboy, MD [facility] None  11/14/2023  8:15 AM Ihor Austin, NP [facility] None  02/13/2024 11:00 AM Adela Glimpse, NP [facility] None    Plan:   During the course of the visit the patient was educated and counseled about appropriate screening and preventive services including:   Pneumococcal vaccine  Influenza vaccine Prevnar 13 Td vaccine Screening electrocardiogram Colorectal cancer screening Diabetes screening Glaucoma screening Nutrition counseling    Subjective:  Jeffery Reed is a 77 y.o. male who presents for Medicare Annual Wellness Visit and 3 month follow up for HTN, hyperlipidemia, T2 diabetes, and vitamin D Def.   Overall he reports feeling well today.  He has no new concerns at this time.  He does share that wife is currently in SNF rehab and recovering from rhinovirus and vertigo.    He has a diagnosis of anxiety and is currently taking Celexa 40 mg QD. Symptoms have improved since increasing his medication to 40 mg QD.   He has BPH/prostatism followed by urology. He gets up 3 times a night to urinate. Overall feels as though symptoms are managed.  BMI is Body mass index is 31.57 kg/m., He does do work around house and yard work no other exercise.  Wt Readings from Last 3 Encounters:  02/13/23 220 lb (99.8 kg)  11/14/22 220 lb (99.8 kg)  10/18/22 216 lb 6.4 oz (98.2 kg)   His blood pressure has been controlled at home, today their BP is BP: (!) 140/88 BP Readings from Last 3 Encounters:  02/13/23 (!)  140/88  11/14/22 120/77  10/18/22 120/70    He does not workout. He denies chest pain, shortness of breath, dizziness.   He is on cholesterol medication (rosuvastatin 10 mg daily) and denies myalgias. His cholesterol is not at goal. The cholesterol last visit was:   Lab Results  Component Value Date   CHOL 155 10/18/2022   HDL 33 (L) 10/18/2022   LDLCALC 93 10/18/2022   TRIG 191 (H) 10/18/2022   CHOLHDL 4.7 10/18/2022   He has been working on diet and exercise for diabetes with CKD, and denies foot ulcerations, hyperglycemia, hypoglycemia , increased appetite, nausea, paresthesia of the feet, polydipsia, polyuria, visual disturbances, vomiting and weight loss. He checks random fasting glucoses; currently on metformin 500 mg BID and glipizide 5 mg 2 tabs twice daily, Trulicity 4.5 mg SQ QW.  Blood sugars fasting running 115-120s. Last A1C in the office was:   Lab Results  Component Value Date   HGBA1C 6.0 (H) 10/18/2022   Last GFR Lab Results  Component Value Date   EGFR 41 (L) 10/18/2022     Patient is on Vitamin D supplement, Lab Results  Component Value Date   VD25OH 53 10/18/2022       Medication Review: Current Outpatient Medications on File Prior to Visit  Medication Sig Dispense Refill   acetaminophen (TYLENOL) 650 MG CR tablet Take 650 mg by mouth every 8 (eight) hours as needed for pain (or headaches).     albuterol (VENTOLIN HFA) 108 (90 Base) MCG/ACT inhaler Inhale into the lungs every 6 (six) hours as needed  for wheezing or shortness of breath.     Cholecalciferol (VITAMIN D3) 10000 units capsule Take 10,000 Units by mouth in the morning.     citalopram (CELEXA) 40 MG tablet Take  1 tablet  Daily  for Mood & Chronic Anxiety 90 tablet 3   Exenatide ER (BYDUREON BCISE) 2 MG/0.85ML AUIJ Inject 2 mg into the skin once a week. 3.4 mL 3   fenofibrate 160 MG tablet Take  1 tablet  Daily  for Triglycerides ( Blood Fats)                               /                                                                    TAKE                                         BY                                                 MOUTH 90 tablet 3   fluticasone (FLONASE) 50 MCG/ACT nasal spray Place 1-2 sprays into both nostrils daily as needed for allergies or rhinitis.     glipiZIDE (GLUCOTROL) 5 MG tablet Take  1 to 2 tablets  3 x /day  with Meals  for Diabetes                                                               /                                           TAKE                                    BY                                    MOUTH (Patient taking differently: Takes two tablet in the morning and two tablets in the evening) 540 tablet 3   glucose blood (FREESTYLE LITE) test strip Check blood sugar 1 time daily-[id number] 100 each 5   levETIRAcetam (KEPPRA) 500 MG tablet TAKE 1 TABLET BY MOUTH TWICE  DAILY 180 tablet 3   metFORMIN (GLUCOPHAGE) 500 MG tablet TAKE 1 TABLET BY MOUTH 3 TIMES  DAILY (Patient taking differently: Take 500 mg by mouth in the morning and at bedtime.) 270 tablet 3   Na Sulfate-K Sulfate-Mg Sulf 17.5-3.13-1.6 GM/177ML SOLN Take 1  kit by mouth as directed. May use generic SUPREP;NO prior authorizations will be done.Please use Singlecare or GOOD-RX coupon. 354 mL 0   ofloxacin (FLOXIN) 0.3 % OTIC solution Place 4 drops into affected ears 2 (two) times daily. (DOS 10/11/22) 10 mL 0   omeprazole (PRILOSEC) 40 MG capsule Take  1 capdsule  Daily to Prevent Heartburn & Indigestion                                                                               /                                                                   TAKE                                         BY                                                 MOUTH 90 capsule 3   rosuvastatin (CRESTOR) 10 MG tablet TAKE 1 TABLET BY MOUTH  DAILY FOR CHOLESTEROL 90 tablet 3   Tetrahydroz-Glyc-Hyprom-PEG (VISINE MAXIMUM REDNESS RELIEF) 0.05-0.2-0.36-1 % SOLN Place 1-2 drops into both eyes 3 (three) times  daily as needed (dry/irrtated eyes.).     valsartan (DIOVAN) 160 MG tablet Take  1 tablet  Daily  for BP & Diabetic Kidney Protection  /                               /                    TAKE                    BY             MOUTH 90 tablet 3   No current facility-administered medications on file prior to visit.    Current Problems (verified) Patient Active Problem List   Diagnosis Date Noted   Personal history of colonic polyps    Benign neoplasm of cecum    Meningioma, cerebral (HCC) 03/09/2022   At risk for central sleep apnea 06/10/2021   Loud snoring 03/08/2021   History of sleep apnea 03/08/2021   Hx of resection of meningioma 03/08/2021   Retrognathia 03/08/2021   Seizure-like activity (HCC) 10/15/2020   Acute-on-chronic kidney injury (HCC) 10/15/2020   Seizures (HCC) 10/14/2020   Renal mass 01/08/2020   Noncompliance with CPAP treatment    Stage 3 chronic kidney disease (HCC)    Steroid-induced hyperglycemia    Enlarged prostate    CKD stage 3 due to type 2 diabetes mellitus (HCC) 03/12/2019  Anxiety 02/18/2018   GERD (gastroesophageal reflux disease) 03/12/2016   Obesity (BMI 30.0-34.9) 06/01/2015   Medication management 08/19/2014   BPH/Prostatism 08/19/2014   Essential hypertension 08/17/2013   Type 2 diabetes mellitus with stage 3 chronic kidney disease, without long-term current use of insulin (HCC) 08/17/2013   Vitamin D deficiency 08/17/2013   Hyperlipidemia associated with type 2 diabetes mellitus (HCC)    Testosterone deficiency    Gout    OSA (obstructive sleep apnea)     Screening Tests Immunization History  Administered Date(s) Administered   DT (Pediatric) 06/01/2015   Fluad Quad(high Dose 65+) 05/20/2020   Influenza Whole 06/05/2013   Influenza, High Dose Seasonal PF 06/05/2014, 06/01/2015, 06/14/2016, 06/22/2017, 06/11/2018, 05/13/2019, 05/17/2021, 07/05/2022   PFIZER(Purple Top)SARS-COV-2 Vaccination 09/27/2019, 10/20/2019   Pneumococcal  Conjugate-13 08/19/2014   Pneumococcal Polysaccharide-23 08/22/2003, 12/10/2015   Tdap 03/08/2004   Zoster, Live 08/18/2013   Preventative care: Last colonoscopy: 01/2019, 3 polyps removed, 05/04/22 due 2026 Ct head 2010 CXR 2010  Prior vaccinations: TD or Tdap: 2016  Influenza: 06/2022  Pneumococcal: 2017 Prevnar13: 2015 Shingles/Zostavax: 2014  Names of Other Physician/Practitioners you currently use: 1. [facility] here for primary care 2. [facility], eye doctor, last visit 07/2022 3.  Dentist:  Dr. Vincenza Hews 07/2022  Patient Care Team: Lucky Cowboy, MD as PCP - General (Internal Medicine) Marcine Matar, MD as Consulting Physician (Urology) Camille Bal, MD as Consulting Physician (Nephrology) Lajoyce Corners, [facility] (Inactive) as Pharmacist (Pharmacist) Ihor Austin, NP as Nurse Practitioner (Neurology) Micki Riley, MD as Referring Physician (Neurology)  Allergies Allergies  Allergen Reactions   Cardura [Doxazosin Mesylate] Other (See Comments)    Nasal congestion   Codeine Other (See Comments)    The patient passed out   Hytrin [Terazosin] Other (See Comments)    Reaction not recalled    SURGICAL HISTORY He  has a past surgical history that includes Foot surgery (10/2009); Tonsillectomy (1953); Myringotomy (Left, 02/2005); Colonoscopy (2020); Craniotomy (Right, 12/29/2019); Colonoscopy with propofol (N/A, 05/04/2022); polypectomy (05/04/2022); and Myringotomy with tube placement (Left, 10/11/2022). FAMILY HISTORY His family history includes Diabetes in his maternal grandmother and paternal grandmother; GER disease in his mother; Hyperlipidemia in his father; Hypertension in his brother, father, and mother; Parkinson's disease in his father; Stroke in his paternal grandfather and paternal grandmother. SOCIAL HISTORY He  reports that he has never smoked. He has never used smokeless tobacco. He reports that he does not  drink alcohol and does not use drugs.  MEDICARE WELLNESS OBJECTIVES: Physical activity: Current Exercise Habits: The patient does not participate in regular exercise at present Cardiac risk factors:   Depression/mood screen:      02/13/2023    1:01 PM  Depression screen PHQ 2/9  Decreased Interest 0  Down, Depressed, Hopeless 1  PHQ - 2 Score 1  Altered sleeping 0  Tired, decreased energy 0  Change in appetite 0  Feeling bad or failure about yourself  0  Trouble concentrating 0  Moving slowly or fidgety/restless 0  Suicidal thoughts 0  PHQ-9 Score 1  Difficult doing work/chores Somewhat difficult    ADLs:     02/13/2023    1:01 PM 10/18/2022    9:18 PM  In your present state of health, do you have any difficulty performing the following activities:  Hearing? 0 0  Vision? 0 0  Difficulty concentrating or making decisions? 0 0  Walking or climbing stairs? 0 0  Dressing or bathing? 0 0  Doing errands, shopping? 0 0     Cognitive Testing  Alert? Yes  Normal Appearance?Yes  Oriented to person? Yes  Place? Yes   Time? Yes  Recall of three objects?  Yes  Can perform simple calculations? Yes  Displays appropriate judgment?Yes  Can read the correct time from a watch face?Yes  EOL planning:   Review of Systems  Constitutional:  Negative for chills, fever and weight loss.  HENT:  Negative for congestion and hearing loss.   Eyes:  Negative for blurred vision and double vision.  Respiratory:  Negative for cough and shortness of breath.   Cardiovascular:  Negative for chest pain, palpitations, orthopnea and leg swelling.  Gastrointestinal:  Negative for abdominal pain, constipation, diarrhea, heartburn, nausea and vomiting.  Genitourinary:  Negative for dysuria.  Musculoskeletal:  Negative for falls, joint pain and myalgias.  Skin:  Negative for rash.  Neurological:  Negative for dizziness, tingling, tremors, loss of consciousness and headaches.  Endo/Heme/Allergies:  Does  not bruise/bleed easily.  Psychiatric/Behavioral:  Negative for depression, memory loss and suicidal ideas.      Objective:   Today's Vitals   02/13/23 1128  BP: (!) 140/88  Pulse: 73  Temp: 97.8 F (36.6 C)  SpO2: 99%  Weight: 220 lb (99.8 kg)  Height: 5\' 10"  (1.778 m)    Body mass index is 31.57 kg/m. General Appearance: Well nourished, in no apparent distress. Eyes: PERRLA, EOMs, conjunctiva no swelling or erythema Sinuses: No Frontal/maxillary tenderness ENT/Mouth: Ext aud canals clear, TMs without erythema, bulging. No erythema, swelling, or exudate on post pharynx.  Tonsils not swollen or erythematous. Hearing normal.  Neck: Supple, thyroid normal.  Respiratory: Respiratory effort normal, BS equal bilaterally without rales, rhonchi, wheezing or stridor.  Cardio: RRR with no MRGs. Brisk peripheral pulses without edema.  Abdomen: Soft, + BS.  Non tender, no guarding, rebound, hernias, masses. Lymphatics: Non tender without lymphadenopathy.  Musculoskeletal: Full ROM, 5/5 strength, normal gait.  Skin: Warm, dry without rashes, lesions, ecchymosis.  Neuro: Cranial nerves intact. Normal muscle tone, no cerebellar symptoms. Sensation intact.  Psych: Awake and oriented X 3, normal affect, Insight and Judgment appropriate    Medicare Attestation I have personally reviewed: The patient's medical and social history Their use of alcohol, tobacco or illicit drugs Their current medications and supplements The patient's functional ability including ADLs,fall risks, home safety risks, cognitive, and hearing and visual impairment Diet and physical activities Evidence for depression or mood disorders  The patient's weight, height, BMI, and visual acuity have been recorded in the chart.  I have made referrals, counseling, and provided education to the patient based on review of the above and I have provided the patient with a written personalized care plan for preventive services.      Adela Glimpse, NP   02/13/2023

## 2023-02-13 NOTE — Patient Instructions (Signed)

## 2023-02-14 LAB — CBC WITH DIFFERENTIAL/PLATELET
Absolute Monocytes: 697 cells/uL (ref 200–950)
Basophils Absolute: 43 cells/uL (ref 0–200)
Basophils Relative: 0.5 %
Eosinophils Absolute: 198 cells/uL (ref 15–500)
Eosinophils Relative: 2.3 %
HCT: 42.4 % (ref 38.5–50.0)
Hemoglobin: 14.3 g/dL (ref 13.2–17.1)
Lymphs Abs: 2150 cells/uL (ref 850–3900)
MCH: 29.7 pg (ref 27.0–33.0)
MCHC: 33.7 g/dL (ref 32.0–36.0)
MCV: 88 fL (ref 80.0–100.0)
MPV: 10.6 fL (ref 7.5–12.5)
Monocytes Relative: 8.1 %
Neutro Abs: 5513 cells/uL (ref 1500–7800)
Neutrophils Relative %: 64.1 %
Platelets: 226 10*3/uL (ref 140–400)
RBC: 4.82 10*6/uL (ref 4.20–5.80)
RDW: 13.1 % (ref 11.0–15.0)
Total Lymphocyte: 25 %
WBC: 8.6 10*3/uL (ref 3.8–10.8)

## 2023-02-14 LAB — COMPLETE METABOLIC PANEL WITH GFR
AG Ratio: 1.7 (calc) (ref 1.0–2.5)
ALT: 16 U/L (ref 9–46)
AST: 21 U/L (ref 10–35)
Albumin: 4.3 g/dL (ref 3.6–5.1)
Alkaline phosphatase (APISO): 57 U/L (ref 35–144)
BUN/Creatinine Ratio: 13 (calc) (ref 6–22)
BUN: 23 mg/dL (ref 7–25)
CO2: 25 mmol/L (ref 20–32)
Calcium: 9.9 mg/dL (ref 8.6–10.3)
Chloride: 105 mmol/L (ref 98–110)
Creat: 1.78 mg/dL — ABNORMAL HIGH (ref 0.70–1.28)
Globulin: 2.6 g/dL (calc) (ref 1.9–3.7)
Glucose, Bld: 118 mg/dL — ABNORMAL HIGH (ref 65–99)
Potassium: 4.6 mmol/L (ref 3.5–5.3)
Sodium: 141 mmol/L (ref 135–146)
Total Bilirubin: 0.6 mg/dL (ref 0.2–1.2)
Total Protein: 6.9 g/dL (ref 6.1–8.1)
eGFR: 39 mL/min/{1.73_m2} — ABNORMAL LOW (ref >=60)

## 2023-02-14 LAB — LIPID PANEL
Cholesterol: 177 mg/dL (ref <200)
HDL: 31 mg/dL — ABNORMAL LOW (ref >=40)
LDL Cholesterol (Calc): 113 mg/dL (calc) — ABNORMAL HIGH
Non-HDL Cholesterol (Calc): 146 mg/dL (calc) — ABNORMAL HIGH (ref <130)
Total CHOL/HDL Ratio: 5.7 (calc) — ABNORMAL HIGH (ref <5.0)
Triglycerides: 211 mg/dL — ABNORMAL HIGH (ref <150)

## 2023-02-14 LAB — HEMOGLOBIN A1C
Hgb A1c MFr Bld: 8 % of total Hgb — ABNORMAL HIGH (ref <5.7)
Mean Plasma Glucose: 183 mg/dL
eAG (mmol/L): 10.1 mmol/L

## 2023-02-14 LAB — TSH: TSH: 1.74 mIU/L (ref 0.40–4.50)

## 2023-02-14 LAB — VITAMIN D 25 HYDROXY (VIT D DEFICIENCY, FRACTURES): Vit D, 25-Hydroxy: 50 ng/mL (ref 30–100)

## 2023-03-30 ENCOUNTER — Encounter: Payer: BLUE CROSS/BLUE SHIELD | Admitting: Internal Medicine

## 2023-04-02 ENCOUNTER — Other Ambulatory Visit: Payer: Self-pay | Admitting: Neurological Surgery

## 2023-04-02 DIAGNOSIS — D320 Benign neoplasm of cerebral meninges: Secondary | ICD-10-CM

## 2023-04-06 ENCOUNTER — Ambulatory Visit: Admission: RE | Admit: 2023-04-06 | Payer: Medicare Other | Source: Ambulatory Visit

## 2023-04-06 DIAGNOSIS — D320 Benign neoplasm of cerebral meninges: Secondary | ICD-10-CM | POA: Insufficient documentation

## 2023-04-06 DIAGNOSIS — Z9889 Other specified postprocedural states: Secondary | ICD-10-CM | POA: Diagnosis not present

## 2023-04-06 MED ORDER — GADOBUTROL 1 MMOL/ML IV SOLN
10.0000 mL | Freq: Once | INTRAVENOUS | Status: AC | PRN
  Administered 2023-04-06: 10 mL via INTRAVENOUS

## 2023-04-12 ENCOUNTER — Ambulatory Visit: Payer: Medicare Other | Admitting: Internal Medicine

## 2023-04-12 ENCOUNTER — Other Ambulatory Visit: Payer: Self-pay

## 2023-04-12 ENCOUNTER — Encounter: Payer: Self-pay | Admitting: Internal Medicine

## 2023-04-12 VITALS — BP 128/60 | HR 72 | Ht 70.0 in | Wt 224.6 lb

## 2023-04-12 DIAGNOSIS — E119 Type 2 diabetes mellitus without complications: Secondary | ICD-10-CM

## 2023-04-12 DIAGNOSIS — E785 Hyperlipidemia, unspecified: Secondary | ICD-10-CM

## 2023-04-12 DIAGNOSIS — Z7984 Long term (current) use of oral hypoglycemic drugs: Secondary | ICD-10-CM

## 2023-04-12 DIAGNOSIS — Z7985 Long-term (current) use of injectable non-insulin antidiabetic drugs: Secondary | ICD-10-CM | POA: Diagnosis not present

## 2023-04-12 DIAGNOSIS — E1169 Type 2 diabetes mellitus with other specified complication: Secondary | ICD-10-CM

## 2023-04-12 DIAGNOSIS — N1832 Chronic kidney disease, stage 3b: Secondary | ICD-10-CM | POA: Diagnosis not present

## 2023-04-12 DIAGNOSIS — E1122 Type 2 diabetes mellitus with diabetic chronic kidney disease: Secondary | ICD-10-CM | POA: Diagnosis not present

## 2023-04-12 MED ORDER — TRULICITY 4.5 MG/0.5ML ~~LOC~~ SOAJ
4.5000 mg | SUBCUTANEOUS | 3 refills | Status: DC
  Filled 2023-04-12: qty 2, 28d supply, fill #0

## 2023-04-12 MED ORDER — FREESTYLE LIBRE 3 READER DEVI: 1.0000 | Freq: Once | 0 refills | Status: AC

## 2023-04-12 MED ORDER — FREESTYLE LIBRE 3 SENSOR MISC: 1.0000 | 3 refills | Status: DC

## 2023-04-12 NOTE — Patient Instructions (Addendum)
Please continue: - Metformin 500 mg 2x a day - Glipizide 10 mg 2x a day before meals  Try to change from Bydureon to Trulicity 4.5 mg weekly.           Try to start the sensor.  Please return in 4 months.

## 2023-04-12 NOTE — Progress Notes (Addendum)
Patient ID: Jeffery Reed, male   DOB: [birth year], 77 y.o.   MRN: [id number]   HPI: KOH AMOR is a 77 y.o.-year-old male, initially referred by his PCP, Lucky Cowboy, MD, returning for follow-up for DM2, dx in 2018, insulin-dependent since 12/2019, uncontrolled, with complications (CKD stage III, diabetic retinopathy).  Last visit 6 months ago.  Interim history: No blurry vision, nausea, chest pain. He has more stress and anxiety (wife has vertigo, and they cannot find any treatment for this) >> he is more dietary indiscretions.  Reviewed HbA1c levels: Lab Results  Component Value Date   HGBA1C 8.0 (H) 02/13/2023   HGBA1C 6.0 (H) 10/18/2022   HGBA1C 5.9 (A) 10/12/2022   HGBA1C 6.3 (H) 07/05/2022   HGBA1C 6.8 (H) 03/29/2022   HGBA1C 6.8 (A) 01/23/2022   HGBA1C 7.1 (H) 09/14/2021   HGBA1C 6.5 (H) 06/15/2021   HGBA1C 6.0 (H) 03/03/2021   HGBA1C 6.7 (A) 01/19/2021   Pt is on a regimen of: - Metformin ER 1000 mg 2x a day, with meals >> ... >> 500 mg 2x a day  - Glipizide 05-25-09 >> 10-10 >> 05-25-09 >> 10 mg 2x a day, before meals - Trulicity 0.75 mg weekly - started 01/2021 >> 1.5 mg >> 3 >> 4.5 mg weekly (by PCP) >> Bydureon 2 mg weekly per insurance preference -  >> stopped 04/2020  Ozempic was not covered in 2024.  Pt checks his sugars once a day: - am: off Trulicity: 214 >> 78-106 >> 110-120 >> 69, 71-120 >> 120-130 - 2h after b'fast: 100, 148, 243 >> 111-167 >> n/c >> 140-159 >> n/c - before lunch: n/c >> 185, 210 >> 80-100 >> 106, 121, 165 >> 110-120 - 2h after lunch: 202 >> n/c >> 118 >> 125, 127 >> n/c >> 155 >> n/c - before dinner:  187 >> off Trulicity: 207 >> n/c >> 180 >> n/c - 2h after dinner: n/c >> 234 >> n/c >> 240 >> n/c >> 260 >> n/c >> 100-200 - bedtime 94 >> n/c >> 140, 158, 310 >> 240 >> n/c - nighttime: n/c Lowest sugar was 82 >> 78 >> 82 >> 69 >> 100; he has hypoglycemia awareness at 100. Highest sugar was 214 >> 310 >> 182 (gatorade) >> 165 >>  <200  Glucometer: Freestyle  Pt's meals are: - Breakfast:  Instant oatmeal or [employer] sausage bisuit or scrambled eggs - Lunch: tomato sandwich, ham - Dinner: larger meal: potatoes; spaghetti, lasagna, chicken, veggies: green beans, corn, broccoli - Snacks:potato chips, apples Diet sodas, unsweet tea,  -+ CKD stage 3b-sees nephrology, last BUN/creatinine:  Lab Results  Component Value Date   BUN 23 02/13/2023   BUN 23 10/18/2022   CREATININE 1.78 (H) 02/13/2023   CREATININE 1.73 (H) 10/18/2022  02/2021: GFR 35 On valsartan 320.  -+ HL; last set of lipids: Lab Results  Component Value Date   CHOL 177 02/13/2023   HDL 31 (L) 02/13/2023   LDLCALC 113 (H) 02/13/2023   TRIG 211 (H) 02/13/2023   CHOLHDL 5.7 (H) 02/13/2023  On Crestor 10, fenofibrate 160.  - last eye exam was on 02/28/2022: No DR; prev.+ DR, + small cataract.   -+ Mild, occasional numbness and tingling in his feet.  Foot exam performed 03/28/2022.  Pt has no FH of DM.  He also has HTN, OSA. Patient has a history of meningioma surgery in 2021.  He recovered well afterwards.  However, he had a seizure in 2022  >>  started Keppra.  He is a retired IT sales professional.  During the winter, he drives a schoolbus.   ROS: + See HPI  I reviewed pt's medications, allergies, PMH, social hx, family hx, and changes were documented in the history of present illness. Otherwise, unchanged from my initial visit note.  Past Medical History:  Diagnosis Date   Allergy    Anxiety    Cataract    beginning stage 01/22/2019   Chronic kidney disease    enlarge bladder   Diabetes mellitus without complication (HCC)    Type 2   GERD (gastroesophageal reflux disease)    Gout    HOH (hard of hearing)    bilateral, no aids   Hyperlipidemia    Hypertension    OSA (obstructive sleep apnea)    uses CPAP   Seizures (HCC)    last 3 years ago in MAY/ benign brain tumor   Sleep apnea    c pap   Type 2 diabetes mellitus with stage 3  chronic kidney disease, without long-term current use of insulin (HCC) 08/17/2013   Urinary frequency    Past Surgical History:  Procedure Laterality Date   COLONOSCOPY  2020   Dr.Jacobs   COLONOSCOPY WITH PROPOFOL N/A 05/04/2022   Procedure: COLONOSCOPY WITH PROPOFOL;  Surgeon: Sherrilyn Rist, MD;  Location: [facility];  Service: Gastroenterology;  Laterality: N/A;   CRANIOTOMY Right 12/29/2019   Procedure: Craniotomy for Tumor;  Surgeon: Barnett Abu, MD;  Location: [facility] OR;  Service: Neurosurgery;  Laterality: Right;  Craniotomy for Tumor Excision   FOOT SURGERY  10/2009   MYRINGOTOMY Left 02/2005   Dr.Newman   MYRINGOTOMY WITH TUBE PLACEMENT Left 10/11/2022   Procedure: LEFT MYRINGOTOMY WITH TUBE PLACEMENT (BUTTERFLY);  Surgeon: Bud Face, MD;  Location: [facility];  Service: ENT;  Laterality: Left;  Diabetic   POLYPECTOMY  05/04/2022   Procedure: POLYPECTOMY;  Surgeon: Sherrilyn Rist, MD;  Location: [facility];  Service: Gastroenterology;;   TONSILLECTOMY  1953   Social History   Socioeconomic History   Marital status: Married    Spouse name: Not on file   Number of children: Not on file   Years of education: Not on file   Highest education level: Not on file  Occupational History   Occupation: Midwife    Comment: on STD 10/25/20  Tobacco Use   Smoking status: Never   Smokeless tobacco: Never  Vaping Use   Vaping status: Never Used  Substance and Sexual Activity   Alcohol use: No   Drug use: No   Sexual activity: Not on file  Other Topics Concern   Not on file  Social History Narrative   Lives with wife   Right handed   Drinks 3-4 cups caffeine daily   Social Determinants of Health   Financial Resource Strain: Not on file  Food Insecurity: No Food Insecurity (11/08/2021)   Hunger Vital Sign    Worried About Running Out of Food in the Last Year: Never true    Ran Out of Food in the Last Year: Never true  Transportation Needs: No  Transportation Needs (11/08/2021)   PRAPARE - Administrator, Civil Service (Medical): No    Lack of Transportation (Non-Medical): No  Physical Activity: Not on file  Stress: Not on file  Social Connections: Not on file  Intimate Partner Violence: Not on file   Current Outpatient Medications on File Prior to Visit  Medication Sig Dispense Refill  acetaminophen (TYLENOL) 650 MG CR tablet Take 650 mg by mouth every 8 (eight) hours as needed for pain (or headaches).     albuterol (VENTOLIN HFA) 108 (90 Base) MCG/ACT inhaler Inhale into the lungs every 6 (six) hours as needed for wheezing or shortness of breath.     Cholecalciferol (VITAMIN D3) 10000 units capsule Take 10,000 Units by mouth in the morning.     citalopram (CELEXA) 40 MG tablet Take  1 tablet  Daily  for Mood & Chronic Anxiety 90 tablet 3   Exenatide ER (BYDUREON BCISE) 2 MG/0.85ML AUIJ Inject 2 mg into the skin once a week. 3.4 mL 3   fenofibrate 160 MG tablet Take  1 tablet  Daily  for Triglycerides ( Blood Fats)                               /                                                                   TAKE                                         BY                                                 MOUTH 90 tablet 3   fluticasone (FLONASE) 50 MCG/ACT nasal spray Place 1-2 sprays into both nostrils daily as needed for allergies or rhinitis.     glipiZIDE (GLUCOTROL) 5 MG tablet Take  1 to 2 tablets  3 x /day  with Meals  for Diabetes                                                               /                                           TAKE                                    BY                                    MOUTH (Patient taking differently: Takes two tablet in the morning and two tablets in the evening) 540 tablet 3   glucose blood (FREESTYLE LITE) test strip Check blood sugar 1 time daily-[id number] 100 each 5   levETIRAcetam (KEPPRA) 500 MG tablet TAKE 1 TABLET BY MOUTH TWICE  DAILY 180 tablet 3  metFORMIN  (GLUCOPHAGE) 500 MG tablet TAKE 1 TABLET BY MOUTH 3 TIMES  DAILY (Patient taking differently: Take 500 mg by mouth in the morning and at bedtime.) 270 tablet 3   Na Sulfate-K Sulfate-Mg Sulf 17.5-3.13-1.6 GM/177ML SOLN Take 1 kit by mouth as directed. May use generic SUPREP;NO prior authorizations will be done.Please use Singlecare or GOOD-RX coupon. 354 mL 0   ofloxacin (FLOXIN) 0.3 % OTIC solution Place 4 drops into affected ears 2 (two) times daily. (DOS 10/11/22) 10 mL 0   omeprazole (PRILOSEC) 40 MG capsule Take  1 capdsule  Daily to Prevent Heartburn & Indigestion                                                                               /                                                                   TAKE                                         BY                                                 MOUTH 90 capsule 3   rosuvastatin (CRESTOR) 10 MG tablet TAKE 1 TABLET BY MOUTH  DAILY FOR CHOLESTEROL 90 tablet 3   Tetrahydroz-Glyc-Hyprom-PEG (VISINE MAXIMUM REDNESS RELIEF) 0.05-0.2-0.36-1 % SOLN Place 1-2 drops into both eyes 3 (three) times daily as needed (dry/irrtated eyes.).     valsartan (DIOVAN) 160 MG tablet Take  1 tablet  Daily  for BP & Diabetic Kidney Protection  /                               /                    TAKE                    BY             MOUTH 90 tablet 3   No current facility-administered medications on file prior to visit.   Allergies  Allergen Reactions   Cardura [Doxazosin Mesylate] Other (See Comments)    Nasal congestion   Codeine Other (See Comments)    The patient passed out   Hytrin [Terazosin] Other (See Comments)    Reaction not recalled   Family History  Problem Relation Age of Onset   Hypertension Mother    GER disease Mother    Hyperlipidemia Father    Hypertension Father    Parkinson's disease Father    Hypertension Brother    Diabetes  Maternal Grandmother    Stroke Paternal Grandmother    Diabetes Paternal Grandmother    Stroke Paternal  Grandfather    Colon cancer Neg Hx    Colon polyps Neg Hx    Esophageal cancer Neg Hx    Stomach cancer Neg Hx    Rectal cancer Neg Hx    PE: BP 128/60   Pulse 72   Ht 5\' 10"  (1.778 m)   Wt 224 lb 9.6 oz (101.9 kg)   SpO2 95%   BMI 32.23 kg/m  Wt Readings from Last 3 Encounters:  04/12/23 224 lb 9.6 oz (101.9 kg)  02/13/23 220 lb (99.8 kg)  11/14/22 220 lb (99.8 kg)   Constitutional: overweight, in NAD, + shuffling gait Eyes:EOMI, no exophthalmos ENT: no thyromegaly, no cervical lymphadenopathy Cardiovascular: RRR, No MRG Respiratory: CTA B Musculoskeletal: no deformities Skin:  no rashes Neurological: + mild tremor with outstretched R hand Diabetic Foot Exam - Simple   Simple Foot Form Diabetic Foot exam was performed with the following findings: Yes 04/12/2023  1:59 PM  Visual Inspection No deformities, no ulcerations, no other skin breakdown bilaterally: Yes Sensation Testing Pulse Check Posterior Tibialis and Dorsalis pulse intact bilaterally: Yes Comments    ASSESSMENT: 1. DM2, insulin-dependent, previously uncontrolled, with long-term complications - CKD stage 3 - DR  2.  Hyperlipidemia  3. Obesity class I  PLAN:  1. Patient with longstanding, previously uncontrolled type 2 diabetes, then with improved control on metformin, sulfonylurea and GLP-1 receptor agonist.  HbA1c at last visit was excellent, at 5.9%.  However, he was not able to find Trulicity due to the [employer] and we sent a prescription for Ozempic to his pharmacy.  This was not approved until he tried [employer].  Unfortunately, HbA1c increased to 8% 2 months ago. -At last visit I recommended a CGM and I advised him to let me know which supplier he was using for his CPAP so we can send the prescription for the CGM to the same supplier.  He still does not remember the name of the supplier. -At today's visit, sugars are at goal in the morning but higher later in the day.  He does not feel that  he benefits from [facility] and he does not like the stick needle.  At today's visit we discussed about trying again to find Trulicity.  We called the downstairs pharmacy and they have it in stock so I sent a prescription there. -We also discussed again about starting a CGM.  For now, we decided to send a prescription for the freestyle libre 3 sensor to the pharmacy and see if this is covered. -He also needs to reduce dietary indiscretions and plans to start doing so.  This will also help the diabetes control. - I suggested to:  Patient Instructions  Please continue: - Metformin 500 mg 2x a day - Glipizide 10 mg 2x a day before meals  Try to change from Bydureon to Trulicity 4.5 mg weekly.           Try to start the sensor.  Please return in 4 months.  - advised to check sugars at different times of the day - 4x a day, rotating check times - advised for yearly eye exams >> he is UTD - return to clinic in 4 months  2. HL -Reviewed latest lipid panel from 2 months ago: LDL above target, triglycerides high, HDL low: Lab Results  Component Value Date   CHOL 177 02/13/2023   HDL  31 (L) 02/13/2023   LDLCALC 113 (H) 02/13/2023   TRIG 211 (H) 02/13/2023   CHOLHDL 5.7 (H) 02/13/2023  -He is on Crestor 10 daily and fenofibrate 160 mg daily.  I recommended co-Q10 for muscle aches in the past.  3.  Obesity class I -He continues the GLP-1 receptor agonist, which should also help with weight loss -However, he gained 18 pounds before the last 2 visits combined -Unfortunately, at last visit, he was not able to find Trulicity.  I called in a prescription for Ozempic, but this was not covered until he tried Bydureon.  He is now on this. -He gained 4 pounds since last visit and he is not happy with the Bydureon injections.  Moreover, blood sugars are higher.  Will try again to switch to Trulicity.  Carlus Pavlov, MD PhD [facility] Endocr[facility]

## 2023-04-15 ENCOUNTER — Other Ambulatory Visit: Payer: Self-pay | Admitting: Internal Medicine

## 2023-04-15 DIAGNOSIS — N183 Chronic kidney disease, stage 3 unspecified: Secondary | ICD-10-CM

## 2023-04-27 ENCOUNTER — Other Ambulatory Visit: Payer: Self-pay | Admitting: Internal Medicine

## 2023-04-27 DIAGNOSIS — F419 Anxiety disorder, unspecified: Secondary | ICD-10-CM

## 2023-04-30 ENCOUNTER — Other Ambulatory Visit: Payer: Self-pay | Admitting: Internal Medicine

## 2023-04-30 ENCOUNTER — Telehealth: Payer: Self-pay | Admitting: Nurse Practitioner

## 2023-04-30 DIAGNOSIS — F419 Anxiety disorder, unspecified: Secondary | ICD-10-CM

## 2023-04-30 DIAGNOSIS — I10 Essential (primary) hypertension: Secondary | ICD-10-CM

## 2023-04-30 DIAGNOSIS — E1122 Type 2 diabetes mellitus with diabetic chronic kidney disease: Secondary | ICD-10-CM

## 2023-04-30 MED ORDER — VALSARTAN 160 MG PO TABS
ORAL_TABLET | ORAL | 3 refills | Status: AC
Start: 2023-04-30 — End: ?

## 2023-04-30 NOTE — Addendum Note (Signed)
Addended by: Dionicio Stall on: 04/30/2023 02:38 PM   Modules accepted: Orders

## 2023-04-30 NOTE — Telephone Encounter (Signed)
Requesting refill on valsartan. Pls send to [facility].

## 2023-05-09 DIAGNOSIS — D320 Benign neoplasm of cerebral meninges: Secondary | ICD-10-CM | POA: Diagnosis not present

## 2023-05-29 ENCOUNTER — Encounter: Payer: Self-pay | Admitting: Internal Medicine

## 2023-05-29 ENCOUNTER — Ambulatory Visit: Payer: Medicare Other | Admitting: Internal Medicine

## 2023-05-29 VITALS — BP 134/70 | HR 72 | Temp 97.9°F | Resp 16 | Ht 70.0 in | Wt 228.0 lb

## 2023-05-29 DIAGNOSIS — E559 Vitamin D deficiency, unspecified: Secondary | ICD-10-CM | POA: Diagnosis not present

## 2023-05-29 DIAGNOSIS — R569 Unspecified convulsions: Secondary | ICD-10-CM

## 2023-05-29 DIAGNOSIS — M1000 Idiopathic gout, unspecified site: Secondary | ICD-10-CM | POA: Diagnosis not present

## 2023-05-29 DIAGNOSIS — E1122 Type 2 diabetes mellitus with diabetic chronic kidney disease: Secondary | ICD-10-CM | POA: Diagnosis not present

## 2023-05-29 DIAGNOSIS — Z8249 Family history of ischemic heart disease and other diseases of the circulatory system: Secondary | ICD-10-CM

## 2023-05-29 DIAGNOSIS — I10 Essential (primary) hypertension: Secondary | ICD-10-CM | POA: Diagnosis not present

## 2023-05-29 DIAGNOSIS — Z23 Encounter for immunization: Secondary | ICD-10-CM | POA: Diagnosis not present

## 2023-05-29 DIAGNOSIS — Z79899 Other long term (current) drug therapy: Secondary | ICD-10-CM | POA: Diagnosis not present

## 2023-05-29 DIAGNOSIS — Z125 Encounter for screening for malignant neoplasm of prostate: Secondary | ICD-10-CM

## 2023-05-29 DIAGNOSIS — N138 Other obstructive and reflux uropathy: Secondary | ICD-10-CM

## 2023-05-29 DIAGNOSIS — Z1211 Encounter for screening for malignant neoplasm of colon: Secondary | ICD-10-CM

## 2023-05-29 DIAGNOSIS — Z0000 Encounter for general adult medical examination without abnormal findings: Secondary | ICD-10-CM | POA: Diagnosis not present

## 2023-05-29 DIAGNOSIS — E785 Hyperlipidemia, unspecified: Secondary | ICD-10-CM | POA: Diagnosis not present

## 2023-05-29 DIAGNOSIS — G4733 Obstructive sleep apnea (adult) (pediatric): Secondary | ICD-10-CM

## 2023-05-29 DIAGNOSIS — Z136 Encounter for screening for cardiovascular disorders: Secondary | ICD-10-CM

## 2023-05-29 DIAGNOSIS — E1169 Type 2 diabetes mellitus with other specified complication: Secondary | ICD-10-CM | POA: Diagnosis not present

## 2023-05-29 DIAGNOSIS — N1831 Chronic kidney disease, stage 3a: Secondary | ICD-10-CM | POA: Diagnosis not present

## 2023-05-29 NOTE — Progress Notes (Signed)
Comprehensive Evaluation & Examination  Future Appointments  Date Time Provider Department  08/13/2023  1:40 PM Carlus Pavlov, MD [id number]  11/14/2023  8:15 AM Ihor Austin, NP [facility]  02/13/2024                     wellness 11:00 AM Adela Glimpse, NP [facility]  06/10/2024                    cpe  3:00 PM Lucky Cowboy, MD [facility]            This very nice 77 y.o. MWM presents for a comprehensive evaluation and management of multiple medical co-morbidities.  Patient has been followed for HTN, HLD, T2_NIDDM  /CKD3a  and Vitamin D Deficiency.   Patient also is on CPAP for OSA with improved sleep hygiene managed by Dr Vickey Huger .                                                    In  2021 patient underwent craniotomy for meningoma resection by Dr Danielle Dess and post-op on Decadron flared his diabetes requiring insulin & patient was referred to Dr Elvera Lennox for management of Insulin .  Since then, patient has tapered off insulin & continues on Metformin, Glipizide and Trulicity.         HTN predates circa  1992. Patient's BP has been controlled at home.  Today's BP is  at goal - 134/70 . Patient denies any cardiac symptoms as chest pain, palpitations, shortness of breath, dizziness or ankle swelling.       Patient's hyperlipidemia is not controlled with diet and Rosuvastatin /Fenofibrate . Patient denies myalgias or other medication SE's. Last lipids were not at goal :  Lab Results  Component Value Date   CHOL 177 02/13/2023   HDL 31 (L) 02/13/2023   LDLCALC 113 (H) 02/13/2023   TRIG 211 (H) 02/13/2023   CHOLHDL 5.7 (H) 02/13/2023         Patient has hx/o  PreDiabetes  (2008) transitioning to T2_NIDDM circa 2014   and patient denies reactive hypoglycemic symptoms, visual blurring, diabetic polys or paresthesias.  Patient is OFF of Insulin & is on Trulicity, Glipizide & Metformin.  Patient admits "overeating despite Trulicity.   BMI = 32.11 - patient remains OVERWEIGHT.    Last A1c was not at goal :   Lab Results  Component Value Date   HGBA1C 8.0 (H) 02/13/2023   Wt Readings from Last 3 Encounters:  05/29/23 228 lb (103.4 kg)  04/12/23 224 lb 9.6 oz (101.9 kg)  02/13/23 220 lb (99.8 kg)         Finally, patient has history of Vitamin D Deficiency ("22" /2008) and last vitamin D was still low (goal  70-100) :    Lab Results  Component Value Date   VD25OH 50 02/13/2023       Current Outpatient Medications on File Prior to Visit  Medication Sig   acetaminophen  650 MG CR tablet Take 650 mg every 8 hours as needed for pain   VITAMIN D3 10000 units capsule Take  at bedtime.    citalopram 20 MG tablet TAKE 1 TABLET DAILY     TRULICITY 3 MG/0.5ML  Inject 3.0 mg weekly under skin   fenofibrate 160 MG tablet Take 1  tablet  Daily    fexofenadine 180 MG tablet Take daily as needed for allergies    FLONASE nasal spray Place 2 sprays into  nostrils daily as needed   glipiZIDE 5 MG tablet Takes 2 tablets 2 times daily before a meal.   levETIRAcetam (KEPPRA) 500 MG  Take 1 tablet  2  times daily.   metFORMIN  500 MG tablet Take 1 tablet daily with supper.   omeprazole 40 MG capsule Take 1 capsule Daily   polyethylene glycol  Take daily as needed    pyridOXINE (B-6) 50 MG tablet Take 1 tablet daily.   rosuvastatin 10 MG tablet Take  1 tablet  Daily     valsartan 160 MG tablet Take  1 tablet  Daily      Allergies  Allergen Reactions   Cardura [Doxazosin Mesylate]     Nasal congestion   Codeine     The patient passed out     Past Medical History:  Diagnosis Date   Allergy    Anxiety    Cataract    beginning stage 01/22/2019   Chronic kidney disease    enlarge bladder   Diabetes mellitus without complication (HCC)    GERD (gastroesophageal reflux disease)    Gout    Hyperlipidemia    Hypertension    OSA (obstructive sleep apnea)    Pre-diabetes    Seizures (HCC)    Sleep apnea    c pap    Health Maintenance  Topic Date Due   Zoster  Vaccines- Shingrix (1 of 2) Never done   COVID-19 Vaccine (3 - Pfizer risk series) 11/17/2019   FOOT EXAM  03/02/2021   INFLUENZA VACCINE  03/21/2021   HEMOGLOBIN A1C  07/21/2021   OPHTHALMOLOGY EXAM  12/30/2021   COLONOSCOPY ( 02/02/2022   TETANUS/TDAP  05/31/2025   Hepatitis C Screening  Completed   PNA vac Low Risk Adult  Completed   HPV VACCINES  Aged Out     Immunization History  Administered Date(s) Administered   DT  06/01/2015   Fluad Quad - high Dose 65+) 05/20/2020   Influenza Whole 06/05/2013   Influenza, High Dose  06/11/2018, 05/13/2019   PFIZER  SARS-COV-2 Vacc 09/27/2019, 10/20/2019   Pneumococcal -13 08/19/2014   Pneumococcal -23 08/22/2003, 12/10/2015   Tdap 03/08/2004   Zoster, Live 08/18/2013    Last Colon - 02/03/2019 - Dr Christella Hartigan - Recc 3 year f/u due Mar 2023 overdue  - 01/06/22 & 01/27/22 - Dr Christella Hartigan sent recall letters    - Patient has Colonoscopy scheduled 8/22 with Dr Christella Hartigan.    Past Surgical History:  Procedure Laterality Date   COLONOSCOPY     CRANIOTOMY Right 12/29/2019   Procedure: Craniotomy for Tumor; Barnett Abu, MD   FOOT SURGERY  10/2009   MYRINGOTOMY Left 02-2005   Dr.Newman   TONSILLECTOMY  1953    Family History  Problem Relation Age of Onset   Hypertension Mother    GER disease Mother    Hyperlipidemia Father    Hypertension Father    Parkinson's disease Father    Hypertension Brother    Diabetes Maternal Grandmother    Stroke Paternal Grandmother    Diabetes Paternal Grandmother    Stroke Paternal Grandfather    Colon cancer Neg Hx    Colon polyps Neg Hx    Esophageal cancer Neg Hx    Stomach cancer Neg Hx    Rectal cancer Neg Hx     Social History  Socioeconomic History   Marital status: Married    Spouse name: Peggy   Number of children: 1 adopted daughter  Occupational History   Occupation: Midwife    Comment: on STD 10/25/20  Tobacco Use   Smoking status: Never   Smokeless tobacco: Never  Vaping  Use   Vaping Use: Never used  Substance and Sexual Activity   Alcohol use: No   Drug use: No   Sexual activity: Not on file  Social History Narrative   Lives with wife   Right handed   Drinks 3-4 cups caffeine daily    ROS Constitutional: Denies fever, chills, weight loss/gain, headaches, insomnia,  night sweats or change in appetite. Does c/o fatigue. Eyes: Denies redness, blurred vision, diplopia, discharge, itchy or watery eyes.  ENT: Denies discharge, congestion, post nasal drip, epistaxis, sore throat, earache, hearing loss, dental pain, Tinnitus, Vertigo, Sinus pain or snoring.  Cardio: Denies chest pain, palpitations, irregular heartbeat, syncope, dyspnea, diaphoresis, orthopnea, PND, claudication or edema Respiratory: denies cough, dyspnea, DOE, pleurisy, hoarseness, laryngitis or wheezing.  Gastrointestinal: Denies dysphagia, heartburn, reflux, water brash, pain, cramps, nausea, vomiting, bloating, diarrhea, constipation, hematemesis, melena, hematochezia, jaundice or hemorrhoids Genitourinary: Denies dysuria, frequency, urgency, nocturia, hesitancy, discharge, hematuria or flank pain Musculoskeletal: Denies arthralgia, myalgia, stiffness, Jt. Swelling, pain, limp or strain/sprain. Denies Falls. Skin: Denies puritis, rash, hives, warts, acne, eczema or change in skin lesion Neuro: No weakness, tremor, incoordination, spasms, paresthesia or pain Psychiatric: Denies confusion, memory loss or sensory loss. Denies Depression. Endocrine: Denies change in weight, skin, hair change, nocturia, and paresthesia, diabetic polys, visual blurring or hyper / hypo glycemic episodes.  Heme/Lymph: No excessive bleeding, bruising or enlarged lymph nodes.   Physical Exam  BP 134/70   Pulse 72   Temp 97.9 F (36.6 C)   Resp 16   Ht 5\' 10"  (1.778 m)   Wt 228 lb (103.4 kg)   SpO2 96%   BMI 32.71 kg/m   General Appearance:  Over nourished  and in no apparent distress.  Eyes: PERRLA, EOMs,  conjunctiva no swelling or erythema, normal fundi and vessels. Sinuses: No frontal/maxillary tenderness ENT/Mouth: EACs patent / TMs  nl. Nares clear without erythema, swelling, mucoid exudates. Oral hygiene is good. No erythema, swelling, or exudate. Tongue normal, non-obstructing. Tonsils not swollen or erythematous. Hearing normal.  Neck: Supple, thyroid not palpable. No bruits, nodes or JVD. Respiratory: Respiratory effort normal.  BS equal and clear bilateral without rales, rhonci, wheezing or stridor. Cardio: Heart sounds are normal with regular rate and rhythm and no murmurs, rubs or gallops. Peripheral pulses are normal and equal bilaterally without edema. No aortic or femoral bruits. Chest: symmetric with normal excursions and percussion.  Abdomen: Soft, with Nl bowel sounds. Nontender, no guarding, rebound, hernias, masses, or organomegaly.  Lymphatics: Non tender without lymphadenopathy.  Musculoskeletal: Full ROM all peripheral extremities, joint stability, 5/5 strength, and normal gait. Skin: Warm and dry without rashes, lesions, cyanosis, clubbing or  ecchymosis.  Neuro: Cranial nerves intact, reflexes equal bilaterally. Normal muscle tone, no cerebellar symptoms. Sensation intact.  Pysch: Alert and oriented X 3 with normal affect, insight and judgment appropriate.   Assessment and Plan  1. Essential hypertension  - EKG 12-Lead - Urinalysis, Routine w reflex microscopic - Microalbumin / creatinine urine ratio - CBC with Differential/Platelet - COMPLETE METABOLIC PANEL WITH GFR - Magnesium - TSH  2. Hyperlipidemia associated with type 2 diabetes mellitus (HCC)  - EKG 12-Lead - [place], RETROPERITNL ABD,  LTD -  Lipid panel - TSH  3. Type 2 diabetes mellitus with stage 3a chronic kidney                                                    disease, without long-term current use of insulin (HCC)  - EKG 12-Lead - [place], RETROPERITNL ABD,  LTD - HM DIABETES FOOT EXAM - LOW  EXTREMITY NEUR EXAM DOCUM - Hemoglobin A1c - Insulin, random  4. Vitamin D deficiency  - VITAMIN D 25 Hydroxy  5. Seizures (HCC)  - Levetiracetam level  6. Prostate cancer screening   7. Screening for colorectal cancer  - POC Hemoccult Bld/Stl   8. Screening for ischemic heart disease  - EKG 12-Lead  9. FHx: heart disease  - EKG 12-Lead - [place], RETROPERITNL ABD,  LTD  10. Screening for AAA (aortic abdominal aneurysm)  - [place], RETROPERITNL ABD,  LTD  11. OSA on CPAP   12. Medication management  - Urinalysis, Routine w reflex microscopic - Microalbumin / creatinine urine ratio - CBC with Differential/Platelet - COMPLETE METABOLIC PANEL WITH GFR - Magnesium - Lipid panel - TSH - Hemoglobin A1c - Insulin, random - VITAMIN D 25 Hydroxy          Patient was counseled in prudent diet, weight control to achieve/maintain BMI less than 25, BP monitoring, regular exercise and medications as discussed.  Discussed med effects and SE's. Routine screening labs and tests as requested with regular follow-up as recommended. Over 40 minutes of exam, counseling, chart review and high complex critical decision making was performed   Marinus Maw, MD

## 2023-05-29 NOTE — Patient Instructions (Signed)

## 2023-05-30 ENCOUNTER — Other Ambulatory Visit: Payer: Self-pay | Admitting: Internal Medicine

## 2023-05-30 DIAGNOSIS — E1122 Type 2 diabetes mellitus with diabetic chronic kidney disease: Secondary | ICD-10-CM

## 2023-05-30 NOTE — Progress Notes (Signed)
<>*<>*<>*<>*<>*<>*<>*<>*<>*<>*<>*<>*<>*<>*<>*<>*<>*<>*<>*<>*<>*<>*<>*<>*<> <>*<>*<>*<>*<>*<>*<>*<>*<>*<>*<>*<>*<>*<>*<>*<>*<>*<>*<>*<>*<>*<>*<>*<>*<>  -Test results slightly outside the reference range are not unusual. If there is anything important, I will review this with you,  otherwise it is considered normal test values.  If you have further questions,  please do not hesitate to contact me at the office or via My Chart.   <>*<>*<>*<>*<>*<>*<>*<>*<>*<>*<>*<>*<>*<>*<>*<>*<>*<>*<>*<>*<>*<>*<>*<>*<> <>*<>*<>*<>*<>*<>*<>*<>*<>*<>*<>*<>*<>*<>*<>*<>*<>*<>*<>*<>*<>*<>*<>*<>*<>  -   Glucose = 198 mg% is Way too High  ( goal is less than 100 mg% )  And - A1c is pending   - Since can't go any higher on Metformin dose due to Chronic Kidney Disease, Please be sure taking the Trulicity Dr Elvera Lennox ordered   - And also be sure taking your Glipizide 3 x / day with Meals !   <>*<>*<>*<>*<>*<>*<>*<>*<>*<>*<>*<>*<>*<>*<>*<>*<>*<>*<>*<>*<>*<>*<>*<>*<> <>*<>*<>*<>*<>*<>*<>*<>*<>*<>*<>*<>*<>*<>*<>*<>*<>*<>*<>*<>*<>*<>*<>*<>*<>  - Chol = 156  &  LDL = 95 - Both OK , But Triglycerides ( = 205 ) or fats in blood are too high                 (   Ideal or  Goal is less than 150  !  )    - Recommend avoid fried & greasy foods,  sweets / candy,   - Avoid white rice  (brown or wild rice or Quinoa is OK),   - Avoid white potatoes  (sweet potatoes are OK)   - Avoid anything made from white flour  - bagels, doughnuts, rolls, buns, biscuits, white and   wheat breads, pizza crust and traditional  pasta made of white flour & egg white  - (vegetarian pasta or spinach or wheat pasta is OK).    - Multi-grain bread is OK - like multi-grain flat bread or  sandwich thins.   - Avoid alcohol in excess.   - Exercise is also important.  <>*<>*<>*<>*<>*<>*<>*<>*<>*<>*<>*<>*<>*<>*<>*<>*<>*<>*<>*<>*<>*<>*<>*<>*<> <>*<>*<>*<>*<>*<>*<>*<>*<>*<>*<>*<>*<>*<>*<>*<>*<>*<>*<>*<>*<>*<>*<>*<>*<>  - Uric Acid / Gout  test is Normal & OK   <>*<>*<>*<>*<>*<>*<>*<>*<>*<>*<>*<>*<>*<>*<>*<>*<>*<>*<>*<>*<>*<>*<>*<>*<> <>*<>*<>*<>*<>*<>*<>*<>*<>*<>*<>*<>*<>*<>*<>*<>*<>*<>*<>*<>*<>*<>*<>*<>*<>  - Vitamin D = 41 - is verty low  !   - Vitamin D goal is between 70-100.   - Please make sure that you are taking your Vitamin D 10,000 units /day as directed.   - It is very important as a natural anti-inflammatory and helping the                           immune system protect against viral infections, like the Covid-19    helping hair, skin, and nails, as well as reducing stroke and heart attack risk.   - It helps your bones and helps with mood.  - It also decreases numerous cancer risks so please                                                                                           take it as directed.   - Low Vit D is associated with a 200-300% higher risk for CANCER   and 200-300% higher risk for HEART   ATTACK  &  STROKE.    - It is also associated with higher death rate at younger ages,   autoimmune diseases like Rheumatoid arthritis, Lupus, Multiple Sclerosis.     -  Also many other serious conditions, like depression, Alzheimer's  Dementia,  muscle aches, fatigue, fibromyalgia   <>*<>*<>*<>*<>*<>*<>*<>*<>*<>*<>*<>*<>*<>*<>*<>*<>*<>*<>*<>*<>*<>*<>*<>*<> <>*<>*<>*<>*<>*<>*<>*<>*<>*<>*<>*<>*<>*<>*<>*<>*<>*<>*<>*<>*<>*<>*<>*<>*<>  - All Else - CBC - Kidneys - Electrolytes - Liver - Magnesium & Thyroid    - all  Normal / OK  <>*<>*<>*<>*<>*<>*<>*<>*<>*<>*<>*<>*<>*<>*<>*<>*<>*<>*<>*<>*<>*<>*<>*<>*<> <>*<>*<>*<>*<>*<>*<>*<>*<>*<>*<>*<>*<>*<>*<>*<>*<>*<>*<>*<>*<>*<>*<>*<>*<         -                      <>*<>*<>*<>*<>*<>*<>*<>*<>*<>*<>*<>*<>*<>*<>*<>*<>*<>*<>*<>*<>*<>*<>*<>*<> <>*<>*<>*<>*<>*<>*<>*<>*<>*<>*<>*<>*<>*<>*<>*<>*<>*<>*<>*<>*<>*<>*<>*<>*<>  -

## 2023-06-01 DIAGNOSIS — H524 Presbyopia: Secondary | ICD-10-CM | POA: Diagnosis not present

## 2023-06-01 DIAGNOSIS — H2513 Age-related nuclear cataract, bilateral: Secondary | ICD-10-CM | POA: Diagnosis not present

## 2023-06-01 DIAGNOSIS — H52223 Regular astigmatism, bilateral: Secondary | ICD-10-CM | POA: Diagnosis not present

## 2023-06-01 DIAGNOSIS — H25013 Cortical age-related cataract, bilateral: Secondary | ICD-10-CM | POA: Diagnosis not present

## 2023-06-01 DIAGNOSIS — E119 Type 2 diabetes mellitus without complications: Secondary | ICD-10-CM | POA: Diagnosis not present

## 2023-06-01 LAB — URINALYSIS, ROUTINE W REFLEX MICROSCOPIC
Bacteria, UA: NONE SEEN /[HPF]
Bilirubin Urine: NEGATIVE
Hgb urine dipstick: NEGATIVE
Hyaline Cast: NONE SEEN /[LPF]
Ketones, ur: NEGATIVE
Leukocytes,Ua: NEGATIVE
Nitrite: NEGATIVE
RBC / HPF: NONE SEEN /[HPF] (ref 0–2)
Specific Gravity, Urine: 1.024 (ref 1.001–1.035)
WBC, UA: NONE SEEN /[HPF] (ref 0–5)
pH: 5.5 (ref 5.0–8.0)

## 2023-06-01 LAB — COMPLETE METABOLIC PANEL WITH GFR
AG Ratio: 1.9 (calc) (ref 1.0–2.5)
ALT: 15 U/L (ref 9–46)
AST: 22 U/L (ref 10–35)
Albumin: 4.4 g/dL (ref 3.6–5.1)
Alkaline phosphatase (APISO): 58 U/L (ref 35–144)
BUN/Creatinine Ratio: 10 (calc) (ref 6–22)
BUN: 17 mg/dL (ref 7–25)
CO2: 26 mmol/L (ref 20–32)
Calcium: 9.4 mg/dL (ref 8.6–10.3)
Chloride: 108 mmol/L (ref 98–110)
Creat: 1.78 mg/dL — ABNORMAL HIGH (ref 0.70–1.28)
Globulin: 2.3 g/dL (ref 1.9–3.7)
Glucose, Bld: 198 mg/dL — ABNORMAL HIGH (ref 65–99)
Potassium: 4.4 mmol/L (ref 3.5–5.3)
Sodium: 142 mmol/L (ref 135–146)
Total Bilirubin: 0.4 mg/dL (ref 0.2–1.2)
Total Protein: 6.7 g/dL (ref 6.1–8.1)
eGFR: 39 mL/min/{1.73_m2} — ABNORMAL LOW (ref >=60)

## 2023-06-01 LAB — CBC WITH DIFFERENTIAL/PLATELET
Absolute Monocytes: 629 {cells}/uL (ref 200–950)
Basophils Absolute: 59 {cells}/uL (ref 0–200)
Basophils Relative: 0.8 %
Eosinophils Absolute: 207 {cells}/uL (ref 15–500)
Eosinophils Relative: 2.8 %
HCT: 41.5 % (ref 38.5–50.0)
Hemoglobin: 13.5 g/dL (ref 13.2–17.1)
Lymphs Abs: 1709 {cells}/uL (ref 850–3900)
MCH: 30 pg (ref 27.0–33.0)
MCHC: 32.5 g/dL (ref 32.0–36.0)
MCV: 92.2 fL (ref 80.0–100.0)
MPV: 10.5 fL (ref 7.5–12.5)
Monocytes Relative: 8.5 %
Neutro Abs: 4795 {cells}/uL (ref 1500–7800)
Neutrophils Relative %: 64.8 %
Platelets: 184 10*3/uL (ref 140–400)
RBC: 4.5 10*6/uL (ref 4.20–5.80)
RDW: 12.9 % (ref 11.0–15.0)
Total Lymphocyte: 23.1 %
WBC: 7.4 10*3/uL (ref 3.8–10.8)

## 2023-06-01 LAB — TEST AUTHORIZATION

## 2023-06-01 LAB — HEMOGLOBIN A1C
Hgb A1c MFr Bld: 8.2 %{Hb} — ABNORMAL HIGH (ref <5.7)
Mean Plasma Glucose: 189 mg/dL
eAG (mmol/L): 10.4 mmol/L

## 2023-06-01 LAB — LIPID PANEL
Cholesterol: 156 mg/dL (ref <200)
HDL: 31 mg/dL — ABNORMAL LOW (ref >=40)
LDL Cholesterol (Calc): 95 mg/dL
Non-HDL Cholesterol (Calc): 125 mg/dL (ref <130)
Total CHOL/HDL Ratio: 5 (calc) — ABNORMAL HIGH (ref <5.0)
Triglycerides: 205 mg/dL — ABNORMAL HIGH (ref <150)

## 2023-06-01 LAB — MICROALBUMIN / CREATININE URINE RATIO
Creatinine, Urine: 126 mg/dL (ref 20–320)
Microalb Creat Ratio: 31 mg/g{creat} — ABNORMAL HIGH (ref <30)
Microalb, Ur: 3.9 mg/dL

## 2023-06-01 LAB — INSULIN, RANDOM: Insulin: 44.1 u[IU]/mL — ABNORMAL HIGH

## 2023-06-01 LAB — URIC ACID: Uric Acid, Serum: 4.4 mg/dL (ref 4.0–8.0)

## 2023-06-01 LAB — VITAMIN D 25 HYDROXY (VIT D DEFICIENCY, FRACTURES): Vit D, 25-Hydroxy: 41 ng/mL (ref 30–100)

## 2023-06-01 LAB — TSH: TSH: 1.93 m[IU]/L (ref 0.40–4.50)

## 2023-06-01 LAB — MICROSCOPIC MESSAGE

## 2023-06-01 LAB — PARATHYROID HORMONE, INTACT (NO CA): PTH: 25 pg/mL (ref 16–77)

## 2023-06-01 LAB — MAGNESIUM: Magnesium: 1.7 mg/dL (ref 1.5–2.5)

## 2023-06-01 NOTE — Progress Notes (Signed)
<>*<>*<>*<>*<>*<>*<>*<>*<>*<>*<>*<>*<>*<>*<>*<>*<>*<>*<>*<>*<>*<>*<>*<>*<> <>*<>*<>*<>*<>*<>*<>*<>*<>*<>*<>*<>*<>*<>*<>*<>*<>*<>*<>*<>*<>*<>*<>*<>*<>  -   A1c finally returned & is elevated at 8.2%     - So,   - Avoid Sweets, Candy & White Stuff   - White Rice, White [place], [place] Flour  - Breads &  Pasta                  &  L O S E      W  E  I GH T  ! .-.-.-.-.-.-.-.-.-.-.-.-.-.-.-.-.-.-.-.-.-.-.-.-.-.-.-.-.-.-.-.-.-.-.-.-.-.-.-.-.-.-.-.-.-.-.-.-.-.-.-.-.-.- .-.-.-.-.-.-.-.-.-.-.-.-.-.-.-.-.-.-.-.-.-.-.-.-.-.-.-.-.-.-.-.-.-.-.-.-.-.-.-.-.-.-.-.-.-.-.-.-.-.-.-.-.-.-  - Also                       Cinnamon helps lower blood sugar & A1c   - Recommend "naturebell"  on [employer] Cinnamon                             Dose is 2 capsules = 9,000 mg / day                              240 capsules  is 4 month supply for $16.95   is about $4 / month  !  .-.-.-.-.-.-.-.-.-.-.-.-.-.-.-.-.-.-.-.-.-.-.-.-.-.-.-.-.-.-.-.-.-.-.-.-.-.-.-.-.-.-.-.-.-.-.-.-.-.-.-.-.-.- .-.-.-.-.-.-.-.-.-.-.-.-.-.-.-.-.-.-.-.-.-.-.-.-.-.-.-.-.-.-.-.-.-.-.-.-.-.-.-.-.-.-.-.-.-.-.-.-.-.-.-.-.-.-   Also, taking 1/2 ounce  = 1 tablespoonful of Apple cider vinegar                                                                                          in some liquid 2 x /day                                        Helps lower blood sugar !  <>*<>*<>*<>*<>*<>*<>*<>*<>*<>*<>*<>*<>*<>*<>*<>*<>*<>*<>*<>*<>*<>*<>*<>*<> <>*<>*<>*<>*<>*<>*<>*<>*<>*<>*<>*<>*<>*<>*<>*<>*<>*<>*<>*<>*<>*<>*<>*<>*<>

## 2023-06-02 ENCOUNTER — Encounter: Payer: Self-pay | Admitting: Internal Medicine

## 2023-06-02 LAB — LEVETIRACETAM, IMMUNOASSAY: LEVETIRACETAM, IMMUNOASSAY: 9.4 ug/mL (ref 6.0–46.0)

## 2023-06-02 NOTE — Progress Notes (Signed)
<>*<>*<>*<>*<>*<>*<>*<>*<>*<>*<>*<>*<>*<>*<>*<>*<>*<>*<>*<>*<>*<>*<>*<>*<> <>*<>*<>*<>*<>*<>*<>*<>*<>*<>*<>*<>*<>*<>*<>*<>*<>*<>*<>*<>*<>*<>*<>*<>*<>     Levetiracetam = Keppra Level finally returned & is OK, So . . .   Please keep dosage same   <>*<>*<>*<>*<>*<>*<>*<>*<>*<>*<>*<>*<>*<>*<>*<>*<>*<>*<>*<>*<>*<>*<>*<>*<> <>*<>*<>*<>*<>*<>*<>*<>*<>*<>*<>*<>*<>*<>*<>*<>*<>*<>*<>*<>*<>*<>*<>*<>*<>

## 2023-06-11 ENCOUNTER — Ambulatory Visit: Payer: Medicare Other

## 2023-06-11 DIAGNOSIS — E1122 Type 2 diabetes mellitus with diabetic chronic kidney disease: Secondary | ICD-10-CM | POA: Diagnosis not present

## 2023-06-11 DIAGNOSIS — N1831 Chronic kidney disease, stage 3a: Secondary | ICD-10-CM | POA: Diagnosis not present

## 2023-06-12 ENCOUNTER — Other Ambulatory Visit: Payer: Self-pay | Admitting: Internal Medicine

## 2023-06-12 DIAGNOSIS — N1832 Chronic kidney disease, stage 3b: Secondary | ICD-10-CM

## 2023-06-12 LAB — HEMOGLOBIN A1C
Hgb A1c MFr Bld: 8.1 %{Hb} — ABNORMAL HIGH (ref <5.7)
Mean Plasma Glucose: 186 mg/dL
eAG (mmol/L): 10.3 mmol/L

## 2023-07-23 ENCOUNTER — Other Ambulatory Visit: Payer: Self-pay | Admitting: Nurse Practitioner

## 2023-07-23 DIAGNOSIS — E782 Mixed hyperlipidemia: Secondary | ICD-10-CM

## 2023-08-13 ENCOUNTER — Encounter: Payer: Self-pay | Admitting: Internal Medicine

## 2023-08-13 ENCOUNTER — Ambulatory Visit (INDEPENDENT_AMBULATORY_CARE_PROVIDER_SITE_OTHER): Payer: Medicare Other | Admitting: Internal Medicine

## 2023-08-13 VITALS — BP 120/60 | HR 96 | Ht 70.0 in | Wt 233.8 lb

## 2023-08-13 DIAGNOSIS — E1169 Type 2 diabetes mellitus with other specified complication: Secondary | ICD-10-CM | POA: Diagnosis not present

## 2023-08-13 DIAGNOSIS — E1122 Type 2 diabetes mellitus with diabetic chronic kidney disease: Secondary | ICD-10-CM

## 2023-08-13 DIAGNOSIS — E66812 Obesity, class 2: Secondary | ICD-10-CM

## 2023-08-13 DIAGNOSIS — N1832 Chronic kidney disease, stage 3b: Secondary | ICD-10-CM

## 2023-08-13 DIAGNOSIS — Z7985 Long-term (current) use of injectable non-insulin antidiabetic drugs: Secondary | ICD-10-CM

## 2023-08-13 DIAGNOSIS — Z794 Long term (current) use of insulin: Secondary | ICD-10-CM

## 2023-08-13 DIAGNOSIS — E785 Hyperlipidemia, unspecified: Secondary | ICD-10-CM

## 2023-08-13 LAB — POCT GLYCOSYLATED HEMOGLOBIN (HGB A1C): Hemoglobin A1C: 8.3 % — AB (ref 4.0–5.6)

## 2023-08-13 MED ORDER — FREESTYLE LIBRE 3 READER DEVI: 1.0000 | Freq: Once | 0 refills | Status: AC

## 2023-08-13 MED ORDER — FREESTYLE LIBRE 3 PLUS SENSOR MISC: 1.0000 | 3 refills | Status: DC

## 2023-08-13 MED ORDER — INSULIN PEN NEEDLE 32G X 4 MM MISC: 3 refills | Status: AC

## 2023-08-13 MED ORDER — TIRZEPATIDE 5 MG/0.5ML ~~LOC~~ SOAJ: 5.0000 mg | SUBCUTANEOUS | 3 refills | Status: DC

## 2023-08-13 MED ORDER — LANTUS SOLOSTAR 100 UNIT/ML ~~LOC~~ SOPN: 10.0000 [IU] | PEN_INJECTOR | Freq: Every day | SUBCUTANEOUS | 3 refills | Status: DC

## 2023-08-13 NOTE — Progress Notes (Signed)
Patient ID: Jeffery Reed, male   DOB: [birth year], 77 y.o.   MRN: [id number]   HPI: Jeffery Reed is a 77 y.o.-year-old male, initially referred by his PCP, Jeffery Cowboy, MD, returning for follow-up for DM2, dx in 2018, insulin-dependent since 12/2019, uncontrolled, with complications (CKD stage III, diabetic retinopathy).  Last visit 4 months ago.  Interim history: No blurry vision, nausea, chest pain. Before last visit, he had more stress and anxiety (wife has vertigo, and they cannot find any treatment for this).  Also, he had a lot of stress with his brother passing away this year >> he had more dietary indiscretions.  He gained weight since last visit.  Reviewed HbA1c levels: Lab Results  Component Value Date   HGBA1C 8.1 (H) 06/11/2023   HGBA1C 8.2 (H) 05/29/2023   HGBA1C 8.0 (H) 02/13/2023   HGBA1C 6.0 (H) 10/18/2022   HGBA1C 5.9 (A) 10/12/2022   HGBA1C 6.3 (H) 07/05/2022   HGBA1C 6.8 (H) 03/29/2022   HGBA1C 6.8 (A) 01/23/2022   HGBA1C 7.1 (H) 09/14/2021   HGBA1C 6.5 (H) 06/15/2021   Pt is on a regimen of: - Metformin ER 1000 mg 2x a day, with meals >> ... >> 500 mg 2x a day  - Glipizide 05-25-09 >> 10-10 >> 05-25-09 >> 10 mg 2x a day, before meals - Trulicity 0.75 mg weekly - started 01/2021 >> 1.5 mg >> 3 >> 4.5 mg weekly (by PCP) >> Bydureon 2 mg weekly per insurance preference >> Trulicity 4.5 mg weekly -  >> stopped 04/2020  Ozempic was not covered in 2024.  Pt checks his sugars once a day: - am: off Trulicity: 110-120 >> 69, 71-120 >> 120-130 >> 225-280 - 2h after b'fast: 111-167 >> n/c >> 140-159 >> n/c - before lunch:  80-100 >> 106, 121, 165 >> 110-120 >> n/c - 2h after lunch: 118 >> 125, 127 >> n/c >> 155 >> n/c >> 299  - before dinner:  187 >> off Trulicity: 207 >> n/c >> 180 >> n/c - 2h after dinner: 240 >> n/c >> 260 >> n/c >> 100-200 >> 219 - bedtime 94 >> n/c >> 140, 158, 310 >> 240 >> n/c - nighttime: n/c Lowest sugar was 82 >> 78 >> 82 >> 69 >>  100; he has hypoglycemia awareness at 100. Highest sugar was 214 >> 310 >> 182 (gatorade) >> 165 >> <200 >> 299  Glucometer: Freestyle  Pt's meals are: - Breakfast:  Instant oatmeal or Jeffery Reed sausage bisuit or scrambled eggs - Lunch: tomato sandwich, ham - Dinner: larger meal: potatoes; spaghetti, lasagna, chicken, veggies: green beans, corn, broccoli - Snacks:potato chips, apples Diet sodas, unsweet tea,  -+ CKD stage 3b-sees nephrology, last BUN/creatinine:  Lab Results  Component Value Date   BUN 17 05/29/2023   BUN 23 02/13/2023   CREATININE 1.78 (H) 05/29/2023   CREATININE 1.78 (H) 02/13/2023  02/2021: GFR 35 On valsartan 320.  -+ HL; last set of lipids: Lab Results  Component Value Date   CHOL 156 05/29/2023   HDL 31 (L) 05/29/2023   LDLCALC 95 05/29/2023   TRIG 205 (H) 05/29/2023   CHOLHDL 5.0 (H) 05/29/2023  On Crestor 10, fenofibrate 160.  - last eye exam was on 05/2023: No DR; prev.+ DR, + small cataract. [facility].  -+ Mild, occasional numbness and tingling in his feet.  Foot exam performed 05/29/2023.  Pt has no FH of DM.  He also has HTN, OSA. Patient has a history  of meningioma surgery in 2021.  He recovered well afterwards.  However, he had a seizure in 2022  >> started Keppra.  He is a retired IT sales professional.  During the winter, he drives a schoolbus.   ROS: + See HPI  I reviewed pt's medications, allergies, PMH, social hx, family hx, and changes were documented in the history of present illness. Otherwise, unchanged from my initial visit note.  Past Medical History:  Diagnosis Date   Allergy    Anxiety    Cataract    beginning stage 01/22/2019   Chronic kidney disease    enlarge bladder   Diabetes mellitus without complication (HCC)    Type 2   GERD (gastroesophageal reflux disease)    Gout    HOH (hard of hearing)    bilateral, no aids   Hyperlipidemia    Hypertension    OSA (obstructive sleep apnea)    uses CPAP   Seizures  (HCC)    last 3 years ago in MAY/ benign brain tumor   Sleep apnea    c pap   Type 2 diabetes mellitus with stage 3 chronic kidney disease, without long-term current use of insulin (HCC) 08/17/2013   Urinary frequency    Past Surgical History:  Procedure Laterality Date   COLONOSCOPY  2020   Jeffery Reed   COLONOSCOPY WITH PROPOFOL N/A 05/04/2022   Procedure: COLONOSCOPY WITH PROPOFOL;  Surgeon: Jeffery Rist, MD;  Location: [facility];  Service: Gastroenterology;  Laterality: N/A;   CRANIOTOMY Right 12/29/2019   Procedure: Craniotomy for Tumor;  Surgeon: Jeffery Abu, MD;  Location: [facility] OR;  Service: Neurosurgery;  Laterality: Right;  Craniotomy for Tumor Excision   FOOT SURGERY  10/2009   MYRINGOTOMY Left 02/2005   Jeffery Reed   MYRINGOTOMY WITH TUBE PLACEMENT Left 10/11/2022   Procedure: LEFT MYRINGOTOMY WITH TUBE PLACEMENT (BUTTERFLY);  Surgeon: Jeffery Face, MD;  Location: [facility];  Service: ENT;  Laterality: Left;  Diabetic   POLYPECTOMY  05/04/2022   Procedure: POLYPECTOMY;  Surgeon: Jeffery Rist, MD;  Location: [facility];  Service: Gastroenterology;;   TONSILLECTOMY  1953   Social History   Socioeconomic History   Marital status: Married    Spouse name: Not on file   Number of children: Not on file   Years of education: Not on file   Highest education level: Not on file  Occupational History   Occupation: Midwife    Comment: on STD 10/25/20  Tobacco Use   Smoking status: Never   Smokeless tobacco: Never  Vaping Use   Vaping status: Never Used  Substance and Sexual Activity   Alcohol use: No   Drug use: No   Sexual activity: Not on file  Other Topics Concern   Not on file  Social History Narrative   Lives with wife   Right handed   Drinks 3-4 cups caffeine daily   Social Drivers of Health   Financial Resource Strain: Not on file  Food Insecurity: No Food Insecurity (11/08/2021)   Hunger Vital Sign    Worried About Running Out of  Food in the Last Year: Never true    Ran Out of Food in the Last Year: Never true  Transportation Needs: No Transportation Needs (11/08/2021)   PRAPARE - Administrator, Civil Service (Medical): No    Lack of Transportation (Non-Medical): No  Physical Activity: Not on file  Stress: Not on file  Social Connections: Not on file  Intimate Partner  Violence: Not on file   Current Outpatient Medications on File Prior to Visit  Medication Sig Dispense Refill   acetaminophen (TYLENOL) 650 MG CR tablet Take 650 mg by mouth every 8 (eight) hours as needed for pain (or headaches).     albuterol (VENTOLIN HFA) 108 (90 Base) MCG/ACT inhaler Inhale into the lungs every 6 (six) hours as needed for wheezing or shortness of breath.     Cholecalciferol (VITAMIN D3) 10000 units capsule Take 10,000 Units by mouth in the morning.     citalopram (CELEXA) 40 MG tablet TAKE 1 TABLET  DAILY FOR MOOD OR CHRONIC ANXIETY                                                       /                                                                   TAKE                                         BY                                                 MOUTH 90 tablet 3   citalopram (CELEXA) 40 MG tablet TAKE 1 TABLET BY MOUTH DAILY FOR MOOD OR CHRONIC ANXIETY 90 tablet 3   Continuous Glucose Sensor (FREESTYLE LIBRE 3 SENSOR) MISC 1 each by Does not apply route every 14 (fourteen) days. 6 each 3   fenofibrate 160 MG tablet Take  1 tablet  Daily  for Triglycerides ( Blood Fats)                               /                                                                   TAKE                                         BY                                                 MOUTH 90 tablet 3   fluticasone (FLONASE) 50 MCG/ACT nasal spray Place 1-2 sprays into both nostrils daily as needed for allergies or rhinitis.  glipiZIDE (GLUCOTROL) 5 MG tablet TAKE 1 TO 2 TABLETS BY MOUTH 3  TIMES DAILY WITH MEALS FOR  DIABETES 540 tablet 3    glucose blood (FREESTYLE LITE) test strip Check blood sugar 1 time daily-DX-E11.22 100 each 5   levETIRAcetam (KEPPRA) 500 MG tablet TAKE 1 TABLET BY MOUTH TWICE  DAILY 180 tablet 3   metFORMIN (GLUCOPHAGE) 500 MG tablet TAKE 1 TABLET BY MOUTH 3 TIMES  DAILY 270 tablet 3   Na Sulfate-K Sulfate-Mg Sulf 17.5-3.13-1.6 GM/177ML SOLN Take 1 kit by mouth as directed. May use generic SUPREP;NO prior authorizations will be done.Please use Singlecare or GOOD-RX coupon. 354 mL 0   ofloxacin (FLOXIN) 0.3 % OTIC solution Place 4 drops into affected ears 2 (two) times daily. (DOS 10/11/22) 10 mL 0   omeprazole (PRILOSEC) 40 MG capsule Take  1 capdsule  Daily to Prevent Heartburn & Indigestion                                                                               /                                                                   TAKE                                         BY                                                 MOUTH 90 capsule 3   rosuvastatin (CRESTOR) 10 MG tablet TAKE 1 TABLET BY MOUTH DAILY FOR CHOLESTEROL 90 tablet 3   Tetrahydroz-Glyc-Hyprom-PEG (VISINE MAXIMUM REDNESS RELIEF) 0.05-0.2-0.36-1 % SOLN Place 1-2 drops into both eyes 3 (three) times daily as needed (dry/irrtated eyes.).     TRULICITY 4.5 MG/0.5ML SOPN INJECT 4.5 MG( 0.5 ML) SUBCUTANEOUS EVERY 7 DAYS FOR DIABETES 6 mL 3   valsartan (DIOVAN) 160 MG tablet Take 1 tablet daily for BP and Diabetic Kidney Protection 90 tablet 3   No current facility-administered medications on file prior to visit.   Allergies  Allergen Reactions   Cardura [Doxazosin Mesylate] Other (See Comments)    Nasal congestion   Codeine Other (See Comments)    The patient passed out   Hytrin [Terazosin] Other (See Comments)    Reaction not recalled   Family History  Problem Relation Age of Onset   Hypertension Mother    GER disease Mother    Hyperlipidemia Father    Hypertension Father    Parkinson's disease Father    Hypertension Brother     Diabetes Maternal Grandmother    Stroke Paternal Grandmother    Diabetes Paternal Grandmother    Stroke Paternal Grandfather  Colon cancer Neg Hx    Colon polyps Neg Hx    Esophageal cancer Neg Hx    Stomach cancer Neg Hx    Rectal cancer Neg Hx    PE: BP 120/60   Pulse 96   Ht 5\' 10"  (1.778 m)   Wt 233 lb 12.8 oz (106.1 kg)   SpO2 99%   BMI 33.55 kg/m  Wt Readings from Last 10 Encounters:  08/13/23 233 lb 12.8 oz (106.1 kg)  06/11/23 224 lb 9.6 oz (101.9 kg)  05/29/23 228 lb (103.4 kg)  04/12/23 224 lb 9.6 oz (101.9 kg)  02/13/23 220 lb (99.8 kg)  11/14/22 220 lb (99.8 kg)  10/18/22 216 lb 6.4 oz (98.2 kg)  10/12/22 220 lb (99.8 kg)  10/11/22 214 lb 3.2 oz (97.2 kg)  07/05/22 217 lb 3.2 oz (98.5 kg)   Constitutional: overweight, in NAD, + shuffling gait Eyes:EOMI, no exophthalmos ENT: no thyromegaly, no cervical lymphadenopathy Cardiovascular: Tachycardia, RR, No MRG Respiratory: CTA B Musculoskeletal: no deformities Skin:  no rashes Neurological: + mild tremor with outstretched R hand  ASSESSMENT: 1. DM2, insulin-dependent, previously uncontrolled, with long-term complications - CKD stage 3 - DR  2.  Hyperlipidemia  3. Obesity class I  PLAN:  1. Patient with longstanding uncontrolled type 2 diabetes, on metformin, sulfonylurea, and weekly GLP-1 receptor agonist with still suboptimal control.  At last visit, HbA1c was 8.0%, increased from 6%.  At that time we discussed about reducing dietary indiscretions, changing from Bydureon to Trulicity and I recommended a CGM.  He had another HbA1c level obtained 2 months ago: 8.1%. -At today's visit, he tells me that he was not able to get the CGM as it was not available at the pharmacy.  We discussed that this may have been related to the freestyle libre 3 being on national backorder.  At today's visit I sent a prescription for the freestyle libre 3+ sensor. -At today's visit, sugars are much higher than before,  approximately doubled.  It is unclear what caused this change.  We did stop Bydureon since last visit and replaced it with the highest Trulicity dose.  He tolerates this well. He continues on metformin and glipizide. -We discussed we need to add back long-acting insulin.  He agreed with this.  I advised him to start with a low-dose and increase as needed.  Will also try to change from Trulicity to [facility]. - I suggested to:  Patient Instructions  Please continue: - Metformin 500 mg 2x a day - Glipizide 10 mg 2x a day before meals  Try to change from: - Trulicity 4.5 mg weekly   to Mounjaro 5 mg weekly  Start: - Lantus 10 units at bedtime. Increase by 3 units every 2 days until sugars in am are <140 or when you reach 35 units  Try to start the sensor.  Please return in 1.5 months.  - we checked his HbA1c: 8.3% (higher) - advised to check sugars at different times of the day - 4x a day, rotating check times - advised for yearly eye exams >> he is UTD - return to clinic in 1.5 months  2. HL -Reviewed latest lipid panel from 2 months ago: LDL above target, triglycerides high, HDL low: Lab Results  Component Value Date   CHOL 156 05/29/2023   HDL 31 (L) 05/29/2023   LDLCALC 95 05/29/2023   TRIG 205 (H) 05/29/2023   CHOLHDL 5.0 (H) 05/29/2023  -He is on Crestor 10 mg daily  and fenofibrate 160 mg daily.  I recommended co-Q10 for muscle aches in the past.  3.  Obesity class I -He continues on the GLP-1 receptor agonist which should also help with weight loss -At last visit, he had gained 18 pounds before the previous 2 visits combined and he was not able to find Trulicity.  Ozempic was not covered.  He had to start Bydureon as Ozempic was not covered until he tried this.  However, he was not happy with the Bydureon injections, sugars were higher and he gained more weight and wanted to switch back to Trulicity.  I recommended to start a 4.5 mg weekly dose.  He is on this now ($$), but  gained 9 pounds since last visit.  I would really like for him to try Ozempic or Mounjaro.  Will send Mounjaro to his pharmacy and hopefully this will be covered for him  Carlus Pavlov, MD PhD [facility]

## 2023-08-13 NOTE — Progress Notes (Unsigned)
Assessment and Plan: Jeffery Reed" was seen today for acute visit.  Diagnoses and all orders for this visit:  Essential hypertension - continue medications- Valsartan 160 mg every day , DASH diet, exercise and monitor at home. Call if greater than 130/80.   Bedbug bite, initial encounter Advised to call exterminator for evaluation and elimination of bed bugs from home If exterminator does not find bed bugs please notify the office and will reevaluate -     triamcinolone ointment (KENALOG) 0.1 %; Apply 1 Application topically 2 (two) times daily. -     cephALEXin (KEFLEX) 500 MG capsule; Take 1 capsule (500 mg total) by mouth 4 (four) times daily for 10 days.       Further disposition pending results of labs. Discussed med's effects and SE's.   Over 30 minutes of exam, counseling, chart review, and critical decision making was performed.   Future Appointments  Date Time Provider Department Center  09/25/2023  1:20 PM Carlus Pavlov, MD [facility] None  10/02/2023 11:00 AM Lucky Cowboy, MD [facility] None  11/14/2023  8:15 AM Ihor Austin, NP [facility] None  02/13/2024 11:00 AM Adela Glimpse, NP [facility] None  06/10/2024  3:00 PM Lucky Cowboy, MD [facility] None    ------------------------------------------------------------------------------------------------------------------   HPI BP 122/62   Pulse 80   Temp (!) 97.3 F (36.3 C)   Ht 5\' 10"  (1.778 m)   Wt 231 lb 9.6 oz (105.1 kg)   SpO2 94%   BMI 33.23 kg/m  77 y.o.male presents for red bumps  on skin that will scab.  Areas have been present for 1 month. States they have been coming on a little at a time but are getting wore and are on head, extremities, trunk- very itchy and will scratch open  BP well controlled with Valsartan 160 mg QD BP Readings from Last 3 Encounters:  08/16/23 122/62  08/13/23 120/60  06/11/23 120/68  Denies headaches, chest pain, shortness of breath and dizziness   BMI is Body  mass index is 33.23 kg/m., he has been working on diet and exercise. Wt Readings from Last 3 Encounters:  08/16/23 231 lb 9.6 oz (105.1 kg)  08/13/23 233 lb 12.8 oz (106.1 kg)  06/11/23 224 lb 9.6 oz (101.9 kg)   Seen by Dr Elvera Lennox on 07/23/23 and plan was: Please continue: - Metformin 500 mg 2x a day - Glipizide 10 mg 2x a day before meals   Try to change from: - Trulicity 4.5 mg weekly   to Mounjaro 5 mg weekly   Start: - Lantus 10 units at bedtime. Increase by 3 units every 2 days until sugars in am are <140 or when you reach 35 units   Try to start the sensor.   Please return in 1.5 months.  Past Medical History:  Diagnosis Date   Allergy    Anxiety    Cataract    beginning stage 01/22/2019   Chronic kidney disease    enlarge bladder   Diabetes mellitus without complication (HCC)    Type 2   GERD (gastroesophageal reflux disease)    Gout    HOH (hard of hearing)    bilateral, no aids   Hyperlipidemia    Hypertension    OSA (obstructive sleep apnea)    uses CPAP   Seizures (HCC)    last 3 years ago in MAY/ benign brain tumor   Sleep apnea    c pap   Type 2 diabetes mellitus with stage 3 chronic kidney  disease, without long-term current use of insulin (HCC) 08/17/2013   Urinary frequency      Allergies  Allergen Reactions   Cardura [Doxazosin Mesylate] Other (See Comments)    Nasal congestion   Codeine Other (See Comments)    The patient passed out   Hytrin [Terazosin] Other (See Comments)    Reaction not recalled    Current Outpatient Medications on File Prior to Visit  Medication Sig   acetaminophen (TYLENOL) 650 MG CR tablet Take 650 mg by mouth every 8 (eight) hours as needed for pain (or headaches).   albuterol (VENTOLIN HFA) 108 (90 Base) MCG/ACT inhaler Inhale into the lungs every 6 (six) hours as needed for wheezing or shortness of breath.   Cholecalciferol (VITAMIN D3) 10000 units capsule Take 10,000 Units by mouth in the morning.   citalopram  (CELEXA) 40 MG tablet TAKE 1 TABLET  DAILY FOR MOOD OR CHRONIC ANXIETY                                                       /                                                                   TAKE                                         BY                                                 MOUTH   citalopram (CELEXA) 40 MG tablet TAKE 1 TABLET BY MOUTH DAILY FOR MOOD OR CHRONIC ANXIETY   Continuous Glucose Sensor (FREESTYLE LIBRE 3 PLUS SENSOR) MISC 1 each by Does not apply route every 14 (fourteen) days.   fenofibrate 160 MG tablet Take  1 tablet  Daily  for Triglycerides ( Blood Fats)                               /                                                                   TAKE                                         BY  MOUTH   fluticasone (FLONASE) 50 MCG/ACT nasal spray Place 1-2 sprays into both nostrils daily as needed for allergies or rhinitis.   glipiZIDE (GLUCOTROL) 5 MG tablet TAKE 1 TO 2 TABLETS BY MOUTH 3  TIMES DAILY WITH MEALS FOR  DIABETES   glucose blood (FREESTYLE LITE) test strip Check blood sugar 1 time daily-DX-E11.22   insulin glargine (LANTUS SOLOSTAR) 100 UNIT/ML Solostar Pen Inject 10 Units into the skin at bedtime.   Insulin Pen Needle 32G X 4 MM MISC Use 1x a day   levETIRAcetam (KEPPRA) 500 MG tablet TAKE 1 TABLET BY MOUTH TWICE  DAILY   metFORMIN (GLUCOPHAGE) 500 MG tablet TAKE 1 TABLET BY MOUTH 3 TIMES  DAILY   Na Sulfate-K Sulfate-Mg Sulf 17.5-3.13-1.6 GM/177ML SOLN Take 1 kit by mouth as directed. May use generic SUPREP;NO prior authorizations will be done.Please use Singlecare or GOOD-RX coupon.   ofloxacin (FLOXIN) 0.3 % OTIC solution Place 4 drops into affected ears 2 (two) times daily. (DOS 10/11/22)   omeprazole (PRILOSEC) 40 MG capsule Take  1 capdsule  Daily to Prevent Heartburn & Indigestion                                                                               /                                                                    TAKE                                         BY                                                 MOUTH   rosuvastatin (CRESTOR) 10 MG tablet TAKE 1 TABLET BY MOUTH DAILY FOR CHOLESTEROL   Tetrahydroz-Glyc-Hyprom-PEG (VISINE MAXIMUM REDNESS RELIEF) 0.05-0.2-0.36-1 % SOLN Place 1-2 drops into both eyes 3 (three) times daily as needed (dry/irrtated eyes.).   tirzepatide (MOUNJARO) 5 MG/0.5ML Pen Inject 5 mg into the skin once a week.   TRULICITY 4.5 MG/0.5ML SOPN INJECT 4.5 MG( 0.5 ML) SUBCUTANEOUS EVERY 7 DAYS FOR DIABETES   valsartan (DIOVAN) 160 MG tablet Take 1 tablet daily for BP and Diabetic Kidney Protection   No current facility-administered medications on file prior to visit.    ROS: all negative except above.   Physical Exam:  BP 122/62   Pulse 80   Temp (!) 97.3 F (36.3 C)   Ht 5\' 10"  (1.778 m)   Wt 231 lb 9.6 oz (105.1 kg)   SpO2 94%   BMI 33.23 kg/m   General Appearance: Well nourished, in no apparent distress. Eyes: PERRLA, EOMs, conjunctiva no swelling or erythema Respiratory: Respiratory effort  normal, BS equal bilaterally without rales, rhonchi, wheezing or stridor.  Cardio: RRR with no MRGs. Brisk peripheral pulses without edema.  Abdomen: Soft, + BS.  Non tender, no guarding, rebound, hernias, masses. Lymphatics: Non tender without lymphadenopathy.  Musculoskeletal: Full ROM, 5/5 strength, normal gait.  Skin: Warm, dry . Erythematous solitary lesions in groups that appear scratched open on arms , head, legs and stomach, back Neuro: Cranial nerves intact. Normal muscle tone, no cerebellar symptoms. Sensation intact.  Psych: Awake and oriented X 3, normal affect, Insight and Judgment appropriate.     Raynelle Dick, NP 9:31 AM [facility]

## 2023-08-13 NOTE — Patient Instructions (Addendum)
Please continue: - Metformin 500 mg 2x a day - Glipizide 10 mg 2x a day before meals  Try to change from: - Trulicity 4.5 mg weekly   to Mounjaro 5 mg weekly  Start: - Lantus 10 units at bedtime. Increase by 3 units every 2 days until sugars in am are <140 or when you reach 35 units  Try to start the sensor.  Please return in 1.5 months.

## 2023-08-16 ENCOUNTER — Ambulatory Visit (INDEPENDENT_AMBULATORY_CARE_PROVIDER_SITE_OTHER): Payer: Medicare Other | Admitting: Nurse Practitioner

## 2023-08-16 ENCOUNTER — Encounter: Payer: Self-pay | Admitting: Nurse Practitioner

## 2023-08-16 VITALS — BP 122/62 | HR 80 | Temp 97.3°F | Ht 70.0 in | Wt 231.6 lb

## 2023-08-16 DIAGNOSIS — I10 Essential (primary) hypertension: Secondary | ICD-10-CM | POA: Diagnosis not present

## 2023-08-16 DIAGNOSIS — W57XXXA Bitten or stung by nonvenomous insect and other nonvenomous arthropods, initial encounter: Secondary | ICD-10-CM

## 2023-08-16 MED ORDER — CEPHALEXIN 500 MG PO CAPS: 500.0000 mg | ORAL_CAPSULE | Freq: Four times a day (QID) | ORAL | 0 refills | Status: AC

## 2023-08-16 MED ORDER — TRIAMCINOLONE ACETONIDE 0.1 % EX OINT: 1.0000 | TOPICAL_OINTMENT | Freq: Two times a day (BID) | CUTANEOUS | 1 refills | Status: AC

## 2023-08-16 NOTE — Patient Instructions (Signed)
Bed Bugs Use triamcinolone to areas twice a day Call exterminator to eliminate infestation

## 2023-09-25 ENCOUNTER — Ambulatory Visit: Payer: Medicare Other | Admitting: Internal Medicine

## 2023-09-25 ENCOUNTER — Encounter: Payer: Self-pay | Admitting: Internal Medicine

## 2023-09-25 VITALS — BP 118/70 | HR 74 | Ht 70.0 in | Wt 229.4 lb

## 2023-09-25 DIAGNOSIS — E66812 Obesity, class 2: Secondary | ICD-10-CM | POA: Diagnosis not present

## 2023-09-25 DIAGNOSIS — Z794 Long term (current) use of insulin: Secondary | ICD-10-CM | POA: Diagnosis not present

## 2023-09-25 DIAGNOSIS — Z7984 Long term (current) use of oral hypoglycemic drugs: Secondary | ICD-10-CM | POA: Diagnosis not present

## 2023-09-25 DIAGNOSIS — Z7985 Long-term (current) use of injectable non-insulin antidiabetic drugs: Secondary | ICD-10-CM | POA: Diagnosis not present

## 2023-09-25 DIAGNOSIS — N1832 Chronic kidney disease, stage 3b: Secondary | ICD-10-CM

## 2023-09-25 DIAGNOSIS — E1122 Type 2 diabetes mellitus with diabetic chronic kidney disease: Secondary | ICD-10-CM

## 2023-09-25 DIAGNOSIS — E785 Hyperlipidemia, unspecified: Secondary | ICD-10-CM

## 2023-09-25 DIAGNOSIS — E1169 Type 2 diabetes mellitus with other specified complication: Secondary | ICD-10-CM

## 2023-09-25 MED ORDER — TIRZEPATIDE 5 MG/0.5ML ~~LOC~~ SOAJ: 5.0000 mg | SUBCUTANEOUS | 3 refills | Status: DC

## 2023-09-25 NOTE — Progress Notes (Signed)
 Patient ID: Jeffery Reed, male   DOB: [birth year], 78 y.o.   MRN: [id number]   HPI: Jeffery Reed is a 78 y.o.-year-old male, initially referred by his PCP, Tonita Fallow, MD, returning for follow-up for DM2, dx in 2018, insulin -dependent since 12/2019, uncontrolled, with complications (CKD stage III, diabetic retinopathy).  Last visit 2 months ago.  Interim history: No blurry vision, nausea, chest pain. He continues to have increased stress and anxiety (wife has vertigo and breast cancer).   Reviewed HbA1c levels: Lab Results  Component Value Date   HGBA1C 8.3 (A) 08/13/2023   HGBA1C 8.1 (H) 06/11/2023   HGBA1C 8.2 (H) 05/29/2023   HGBA1C 8.0 (H) 02/13/2023   HGBA1C 6.0 (H) 10/18/2022   HGBA1C 5.9 (A) 10/12/2022   HGBA1C 6.3 (H) 07/05/2022   HGBA1C 6.8 (H) 03/29/2022   HGBA1C 6.8 (A) 01/23/2022   HGBA1C 7.1 (H) 09/14/2021   Pt is on a regimen of: - Metformin  ER 1000 mg 2x a day, with meals >> ... >> 500 mg 2-3x a day  - Glipizide  05-25-09 >> .SABRA. 10 mg 2x a day, before meals - Trulicity  >> Bydureon  y per insurance preference >> Trulicity  4.5 mg weekly >> he did not start Mounjaro  as he was not given this from the pharmacy (unclear why) but he did  stop Trulicity ... - Tresiba  10 >> 8 >> Lantus  8  >> 4-6 units at bedtime >> stopped 04/2020  >> restarted 07/2023, but he actually takes it once a week! Ozempic  was not covered in 2024.  Pt checks his sugars >4x a day -he obtained a CGM since last visit:  Prev.: - am: 110-120 >> 69, 71-120 >> 120-130 >> 225-280 - 2h after b'fast: 111-167 >> n/c >> 140-159 >> n/c - before lunch: 106, 121, 165 >> 110-120 >> n/c - 2h after lunch: 125, 127 >> n/c >> 155 >> n/c >> 299  - before dinner:  207 >> n/c >> 180 >> n/c - 2h after dinner: 260 >> n/c >> 100-200 >> 219 - bedtime: n/c >> 140, 158, 310 >> 240 >> n/c - nighttime: n/c Lowest sugar was 69 >> 100 >> 60s; he has hypoglycemia awareness at 100. Highest sugar was 310 >> ... 299 >.  300s.  Glucometer: Freestyle  Pt's meals are: - Breakfast:  Instant oatmeal or [employer] sausage bisuit or scrambled eggs - Lunch: tomato sandwich, ham - Dinner: larger meal: potatoes; spaghetti, lasagna, chicken, veggies: green beans, corn, broccoli - Snacks:potato chips, apples Diet sodas, unsweet tea,  -+ CKD stage 3b-sees nephrology, last BUN/creatinine:  Lab Results  Component Value Date   BUN 17 05/29/2023   BUN 23 02/13/2023   CREATININE 1.78 (H) 05/29/2023   CREATININE 1.78 (H) 02/13/2023   Lab Results  Component Value Date   MICRALBCREAT 31 (H) 05/29/2023   MICRALBCREAT 15 03/29/2022   MICRALBCREAT 3 03/03/2021   MICRALBCREAT 6 03/02/2020   MICRALBCREAT 4 12/12/2018   MICRALBCREAT 6 10/23/2017   MICRALBCREAT 3 09/21/2016   MICRALBCREAT 3 09/03/2015   MICRALBCREAT 3.8 08/19/2014   MICRALBCREAT 5.6 08/18/2013  On valsartan  320.  -+ HL; last set of lipids: Lab Results  Component Value Date   CHOL 156 05/29/2023   HDL 31 (L) 05/29/2023   LDLCALC 95 05/29/2023   TRIG 205 (H) 05/29/2023   CHOLHDL 5.0 (H) 05/29/2023  On Crestor  10, fenofibrate  160.  - last eye exam was on 05/2023: No DR; prev.+ DR, + small cataract. [facility].  -+ Mild,  occasional numbness and tingling in his feet.  Foot exam performed 05/29/2023.  Pt has no FH of DM.  He also has HTN, OSA. Patient has a history of meningioma surgery in 2021.  He recovered well afterwards.  However, he had a seizure in 2022  >> started Keppra .  He is a retired it sales professional.  During the winter, he drives a schoolbus.   ROS: + See HPI  I reviewed pt's medications, allergies, PMH, social hx, family hx, and changes were documented in the history of present illness. Otherwise, unchanged from my initial visit note.  Past Medical History:  Diagnosis Date   Allergy    Anxiety    Cataract    beginning stage 01/22/2019   Chronic kidney disease    enlarge bladder   Diabetes mellitus without complication  (HCC)    Type 2   GERD (gastroesophageal reflux disease)    Gout    HOH (hard of hearing)    bilateral, no aids   Hyperlipidemia    Hypertension    OSA (obstructive sleep apnea)    uses CPAP   Seizures (HCC)    last 3 years ago in MAY/ benign brain tumor   Sleep apnea    c pap   Type 2 diabetes mellitus with stage 3 chronic kidney disease, without long-term current use of insulin  (HCC) 08/17/2013   Urinary frequency    Past Surgical History:  Procedure Laterality Date   COLONOSCOPY  2020   Dr.Jacobs   COLONOSCOPY WITH PROPOFOL  N/A 05/04/2022   Procedure: COLONOSCOPY WITH PROPOFOL ;  Surgeon: Legrand Victory LITTIE DOUGLAS, MD;  Location: [facility];  Service: Gastroenterology;  Laterality: N/A;   CRANIOTOMY Right 12/29/2019   Procedure: Craniotomy for Tumor;  Surgeon: Colon Victory, MD;  Location: [facility] OR;  Service: Neurosurgery;  Laterality: Right;  Craniotomy for Tumor Excision   FOOT SURGERY  10/2009   MYRINGOTOMY Left 02/2005   Dr.Newman   MYRINGOTOMY WITH TUBE PLACEMENT Left 10/11/2022   Procedure: LEFT MYRINGOTOMY WITH TUBE PLACEMENT (BUTTERFLY);  Surgeon: Milissa Hamming, MD;  Location: [facility];  Service: ENT;  Laterality: Left;  Diabetic   POLYPECTOMY  05/04/2022   Procedure: POLYPECTOMY;  Surgeon: Legrand Victory LITTIE DOUGLAS, MD;  Location: [facility];  Service: Gastroenterology;;   TONSILLECTOMY  1953   Social History   Socioeconomic History   Marital status: Married    Spouse name: Not on file   Number of children: Not on file   Years of education: Not on file   Highest education level: Not on file  Occupational History   Occupation: Midwife    Comment: on STD 10/25/20  Tobacco Use   Smoking status: Never   Smokeless tobacco: Never  Vaping Use   Vaping status: Never Used  Substance and Sexual Activity   Alcohol use: No   Drug use: No   Sexual activity: Not on file  Other Topics Concern   Not on file  Social History Narrative   Lives with wife   Right  handed   Drinks 3-4 cups caffeine daily   Social Drivers of Health   Financial Resource Strain: Not on file  Food Insecurity: No Food Insecurity (11/08/2021)   Hunger Vital Sign    Worried About Running Out of Food in the Last Year: Never true    Ran Out of Food in the Last Year: Never true  Transportation Needs: No Transportation Needs (11/08/2021)   PRAPARE - Administrator, Civil Service (Medical):  No    Lack of Transportation (Non-Medical): No  Physical Activity: Not on file  Stress: Not on file  Social Connections: Not on file  Intimate Partner Violence: Not on file   Current Outpatient Medications on File Prior to Visit  Medication Sig Dispense Refill   acetaminophen  (TYLENOL ) 650 MG CR tablet Take 650 mg by mouth every 8 (eight) hours as needed for pain (or headaches).     albuterol  (VENTOLIN  HFA) 108 (90 Base) MCG/ACT inhaler Inhale into the lungs every 6 (six) hours as needed for wheezing or shortness of breath.     Cholecalciferol (VITAMIN D3) 10000 units capsule Take 10,000 Units by mouth in the morning.     citalopram  (CELEXA ) 40 MG tablet TAKE 1 TABLET  DAILY FOR MOOD OR CHRONIC ANXIETY                                                       /                                                                   TAKE                                         BY                                                 MOUTH 90 tablet 3   Continuous Glucose Sensor (FREESTYLE LIBRE 3 PLUS SENSOR) MISC 1 each by Does not apply route every 14 (fourteen) days. 6 each 3   fenofibrate  160 MG tablet Take  1 tablet  Daily  for Triglycerides ( Blood Fats)                               /                                                                   TAKE                                         BY                                                 MOUTH 90 tablet 3   fluticasone  (FLONASE ) 50 MCG/ACT nasal spray Place 1-2 sprays into both nostrils daily  as needed for allergies or rhinitis.      glipiZIDE  (GLUCOTROL ) 5 MG tablet TAKE 1 TO 2 TABLETS BY MOUTH 3  TIMES DAILY WITH MEALS FOR  DIABETES 540 tablet 3   glucose blood (FREESTYLE LITE) test strip Check blood sugar 1 time daily-[id number] 100 each 5   insulin  glargine (LANTUS  SOLOSTAR) 100 UNIT/ML Solostar Pen Inject 10 Units into the skin at bedtime. (Patient not taking: Reported on 08/16/2023) 15 mL 3   Insulin  Pen Needle 32G X 4 MM MISC Use 1x a day 100 each 3   levETIRAcetam  (KEPPRA ) 500 MG tablet TAKE 1 TABLET BY MOUTH TWICE  DAILY 180 tablet 3   metFORMIN  (GLUCOPHAGE ) 500 MG tablet TAKE 1 TABLET BY MOUTH 3 TIMES  DAILY 270 tablet 3   Na Sulfate-K Sulfate-Mg Sulf 17.5-3.13-1.6 GM/177ML SOLN Take 1 kit by mouth as directed. May use generic SUPREP;NO prior authorizations will be done.Please use Singlecare or GOOD-RX coupon. 354 mL 0   omeprazole  (PRILOSEC) 40 MG capsule Take  1 capdsule  Daily to Prevent Heartburn & Indigestion                                                                               /                                                                   TAKE                                         BY                                                 MOUTH 90 capsule 3   rosuvastatin  (CRESTOR ) 10 MG tablet TAKE 1 TABLET BY MOUTH DAILY FOR CHOLESTEROL 90 tablet 3   Tetrahydroz-Glyc-Hyprom-PEG (VISINE MAXIMUM REDNESS RELIEF) 0.05-0.2-0.36-1 % SOLN Place 1-2 drops into both eyes 3 (three) times daily as needed (dry/irrtated eyes.).     tirzepatide  (MOUNJARO ) 5 MG/0.5ML Pen Inject 5 mg into the skin once a week. 6 mL 3   triamcinolone  ointment (KENALOG ) 0.1 % Apply 1 Application topically 2 (two) times daily. 80 g 1   TRULICITY  4.5 MG/0.5ML SOPN INJECT 4.5 MG( 0.5 ML) SUBCUTANEOUS EVERY 7 DAYS FOR DIABETES 6 mL 3   valsartan  (DIOVAN ) 160 MG tablet Take 1 tablet daily for BP and Diabetic Kidney Protection 90 tablet 3   No current facility-administered medications on file prior to visit.   Allergies  Allergen Reactions    Cardura [Doxazosin Mesylate] Other (See Comments)    Nasal congestion   Codeine Other (See Comments)    The patient passed out   Hytrin [Terazosin] Other (See Comments)    Reaction not  recalled   Family History  Problem Relation Age of Onset   Hypertension Mother    GER disease Mother    Hyperlipidemia Father    Hypertension Father    Parkinson's disease Father    Hypertension Brother    Diabetes Maternal Grandmother    Stroke Paternal Grandmother    Diabetes Paternal Grandmother    Stroke Paternal Grandfather    Colon cancer Neg Hx    Colon polyps Neg Hx    Esophageal cancer Neg Hx    Stomach cancer Neg Hx    Rectal cancer Neg Hx    PE: BP 118/70   Pulse 74   Ht 5' 10 (1.778 m)   Wt 229 lb 6.4 oz (104.1 kg)   SpO2 93%   BMI 32.92 kg/m  Wt Readings from Last 10 Encounters:  09/25/23 229 lb 6.4 oz (104.1 kg)  08/16/23 231 lb 9.6 oz (105.1 kg)  08/13/23 233 lb 12.8 oz (106.1 kg)  06/11/23 224 lb 9.6 oz (101.9 kg)  05/29/23 228 lb (103.4 kg)  04/12/23 224 lb 9.6 oz (101.9 kg)  02/13/23 220 lb (99.8 kg)  11/14/22 220 lb (99.8 kg)  10/18/22 216 lb 6.4 oz (98.2 kg)  10/12/22 220 lb (99.8 kg)   Constitutional: overweight, in NAD, + shuffling gait Eyes:EOMI, no exophthalmos ENT: no thyromegaly, no cervical lymphadenopathy Cardiovascular: RRR, No MRG Respiratory: CTA B Musculoskeletal: no deformities Skin:  no rashes Neurological: + mild tremor with outstretched R hand  ASSESSMENT: 1. DM2, insulin -dependent, previously uncontrolled, with long-term complications - CKD stage 3 - DR  2.  Hyperlipidemia  3. Obesity class I  PLAN:  1. Patient with longstanding, uncontrolled type 2 diabetes, on metformin , sulfonylurea, and now also long-acting insulin  and weekly GLP-1/GIP receptor agonist, started since last visit.  At that time he was on Trulicity  and I recommended to switch to Mounjaro  for stronger effect.  Sugars were much higher at that time, approximately  doubled compared to before and he was not clear what could have caused the change.  HbA1c at that time was higher, at 8.3%.  I also recommended a CGM at that time. CGM interpretation; -At today's visit, we reviewed his CGM downloads: It appears that 72% of values are in target range (goal >70%), while 28% are higher than 180 (goal <25%), and 0% are lower than 70 (goal <4%).  The calculated average blood sugar is 159.  The projected HbA1c for the next 3 months (GMI) is 7.1%. -Reviewing the CGM trends, sugars appear to be improved from before, with a predicted HbA1c of 7.1%, improved from 8.3%.  However, upon questioning, he is off Trulicity  as advised at last visit but only advised him to come off to start Mounjaro , which she did not since last visit.  He is not sure why he did not get it from the pharmacy.  He also misunderstood instructions and is only taking a low-dose of Lantus  (he did not increase the dose as advised) and he takes it once a week (rather than once a day, as advised).  At today's visit we discussed about the importance of reading instructions, which I usually review with him during the visit and then print for him with changes show in bold letters.  -At today's visit I advised him to try again to start Mounjaro  but if not he can continue Trulicity .  Also, to move Lantus  to take it once a day, in the morning, and increase the dose as needed. - I  suggested to:  Patient Instructions  Please continue: - Metformin  500 mg 2x a day - Glipizide  10 mg 2x a day before meals  Start: - Mounjaro  5 mg weekly  Change: - Lantus  10 units in am but increase by 2 units every 3 days until sugars are at goal (<130)  Please return in 1.5 months.  - we we will recheck his HbA1c at next visit - advised to check sugars at different times of the day - 4x a day, rotating check times - advised for yearly eye exams >> he is UTD - return to clinic in 1.5 months  2. HL -Reviewed latest lipid panel from  2 months ago: LDL above target, triglycerides high, HDL low: Lab Results  Component Value Date   CHOL 156 05/29/2023   HDL 31 (L) 05/29/2023   LDLCALC 95 05/29/2023   TRIG 205 (H) 05/29/2023   CHOLHDL 5.0 (H) 05/29/2023  -He is on Crestor  10 mg daily and fenofibrate  160 mg daily.  I recommended co-Q10 for muscle aches in the past.  3.  Obesity class I -He continues on the GLP-1 receptor agonist which should also help with weight loss -Ozempic  was not covered.  He had to start Bydureon  as Ozempic  was not covered until he tried this.  However, he was not happy with the Bydureon  injections, sugars were higher and he gained more weight and wanted to switch back to Trulicity .  We started back on Trulicity  but then at last visit I suggested Mounjaro .  He did not start this and is now off Trulicity , also.   Will again send Mounjaro  to his pharmacy and hopefully this will be covered for him. -He lost 4 pounds since last visit.  Lela Fendt, MD PhD [facility] Endocrinology

## 2023-09-25 NOTE — Patient Instructions (Addendum)
Please continue: - Metformin 500 mg 2x a day - Glipizide 10 mg 2x a day before meals  Start: - Mounjaro 5 mg weekly  Change: - Lantus 10 units in am but increase by 2 units every 3 days until sugars are at goal (<130)  Please return in 1.5 months.

## 2023-10-02 ENCOUNTER — Ambulatory Visit (INDEPENDENT_AMBULATORY_CARE_PROVIDER_SITE_OTHER): Payer: Medicare Other | Admitting: Internal Medicine

## 2023-10-02 ENCOUNTER — Encounter: Payer: Self-pay | Admitting: Internal Medicine

## 2023-10-02 ENCOUNTER — Ambulatory Visit: Payer: BLUE CROSS/BLUE SHIELD | Admitting: Internal Medicine

## 2023-10-02 VITALS — BP 122/70 | HR 72 | Ht 70.0 in | Wt 231.4 lb

## 2023-10-02 DIAGNOSIS — Z7985 Long-term (current) use of injectable non-insulin antidiabetic drugs: Secondary | ICD-10-CM

## 2023-10-02 DIAGNOSIS — N183 Chronic kidney disease, stage 3 unspecified: Secondary | ICD-10-CM | POA: Diagnosis not present

## 2023-10-02 DIAGNOSIS — E1122 Type 2 diabetes mellitus with diabetic chronic kidney disease: Secondary | ICD-10-CM

## 2023-10-02 DIAGNOSIS — E66811 Obesity, class 1: Secondary | ICD-10-CM | POA: Diagnosis not present

## 2023-10-02 DIAGNOSIS — Z7984 Long term (current) use of oral hypoglycemic drugs: Secondary | ICD-10-CM

## 2023-10-02 DIAGNOSIS — E7849 Other hyperlipidemia: Secondary | ICD-10-CM

## 2023-10-02 DIAGNOSIS — N1832 Chronic kidney disease, stage 3b: Secondary | ICD-10-CM

## 2023-10-02 LAB — POCT GLYCOSYLATED HEMOGLOBIN (HGB A1C): Hemoglobin A1C: 7.6 % — AB (ref 4.0–5.6)

## 2023-10-02 MED ORDER — GLIPIZIDE 5 MG PO TABS: ORAL_TABLET | ORAL | Status: DC

## 2023-10-02 NOTE — Addendum Note (Signed)
Addended by: Pollie Meyer on: 10/02/2023 02:20 PM   Modules accepted: Orders

## 2023-10-02 NOTE — Progress Notes (Signed)
Patient ID: Jeffery Reed, male   DOB: 1946/07/26, 78 y.o.   MRN: [id number]   HPI: Jeffery Reed is a 78 y.o.-year-old male, initially referred by his PCP, Lucky Cowboy, MD, returning for follow-up for DM2, dx in 2018, insulin-dependent since 12/2019, uncontrolled, with complications (CKD stage III, diabetic retinopathy).  Last visit 9 days ago.  Interim history: No blurry vision, nausea, chest pain. He returns sooner than the next appointment as he has questions about his regimen and also he has some lows.  Reviewed HbA1c levels: Lab Results  Component Value Date   HGBA1C 8.3 (A) 08/13/2023   HGBA1C 8.1 (H) 06/11/2023   HGBA1C 8.2 (H) 05/29/2023   HGBA1C 8.0 (H) 02/13/2023   HGBA1C 6.0 (H) 10/18/2022   HGBA1C 5.9 (A) 10/12/2022   HGBA1C 6.3 (H) 07/05/2022   HGBA1C 6.8 (H) 03/29/2022   HGBA1C 6.8 (A) 01/23/2022   HGBA1C 7.1 (H) 09/14/2021   At last visit he was on: - Metformin ER 1000 mg 2x a day, with meals >> ... >> 500 mg 2-3x a day  - Glipizide 05-25-09 >> .Marland Kitchen. 10 mg 2x a day, before meals - Trulicity >> Bydureon y per insurance preference >> Trulicity 4.5 mg weekly >> he did not start Mounjaro as he was not given this from the pharmacy (unclear why) but he did stop Trulicity... - Tresiba 10 >> 8 >> Lantus 8  >> 4-6 units at bedtime >> stopped 04/2020  >> restarted 07/2023, but he actually takes it once a week! Ozempic was not covered in 2024.  We changed to: - Metformin 500 mg 2x a day - Glipizide 10 mg 2x a day before meals - Mounjaro 5 mg weekly -he obtains this from the pharmacy but is not taking it as he had some lower blood sugars, in the 60s - Lantus 10 units in am [increase by 2 units every 3 days until sugars are at goal (<130)] -he tells me that he is taking 0.2 units, which I advised him it is not possible.  I believe he is taking 12 units, but this is unclear...  Pt checks his sugars >4x a day:  Previously:  Prev.: - am: 110-120 >> 69, 71-120 >>  120-130 >> 225-280 - 2h after b'fast: 111-167 >> n/c >> 140-159 >> n/c - before lunch: 106, 121, 165 >> 110-120 >> n/c - 2h after lunch: 125, 127 >> n/c >> 155 >> n/c >> 299  - before dinner:  207 >> n/c >> 180 >> n/c - 2h after dinner: 260 >> n/c >> 100-200 >> 219 - bedtime: n/c >> 140, 158, 310 >> 240 >> n/c - nighttime: n/c Lowest sugar was 69 >> 100 >> 60s >> 65; he has hypoglycemia awareness at 100. Highest sugar was 310 >> ... 299 >. 300s >> 300s.  Glucometer: Freestyle  Pt's meals are: - Breakfast:  Instant oatmeal or [employer] sausage bisuit or scrambled eggs - Lunch: tomato sandwich, ham - Dinner: larger meal: potatoes; spaghetti, lasagna, chicken, veggies: green beans, corn, broccoli - Snacks:potato chips, apples Diet sodas, unsweet tea,  -+ CKD stage 3b-sees nephrology, last BUN/creatinine:  Lab Results  Component Value Date   BUN 17 05/29/2023   BUN 23 02/13/2023   CREATININE 1.78 (H) 05/29/2023   CREATININE 1.78 (H) 02/13/2023   Lab Results  Component Value Date   MICRALBCREAT 31 (H) 05/29/2023   MICRALBCREAT 15 03/29/2022   MICRALBCREAT 3 03/03/2021   MICRALBCREAT 6 03/02/2020   MICRALBCREAT 4  12/12/2018   MICRALBCREAT 6 10/23/2017   MICRALBCREAT 3 09/21/2016   MICRALBCREAT 3 09/03/2015   MICRALBCREAT 3.8 08/19/2014   MICRALBCREAT 5.6 08/18/2013  On valsartan 320.  -+ HL; last set of lipids: Lab Results  Component Value Date   CHOL 156 05/29/2023   HDL 31 (L) 05/29/2023   LDLCALC 95 05/29/2023   TRIG 205 (H) 05/29/2023   CHOLHDL 5.0 (H) 05/29/2023  On Crestor 10, fenofibrate 160.  - last eye exam was on 05/2023: No DR; prev.+ DR, + small cataract. [facility].  -+ Mild, occasional numbness and tingling in his feet.  Foot exam performed 05/29/2023.  Pt has no FH of DM.  He also has HTN, OSA. Patient has a history of meningioma surgery in 2021.  He recovered well afterwards.  However, he had a seizure in 2022  >> started Keppra.  He is a  retired IT sales professional.  During the winter, he drives a schoolbus.   ROS: + See HPI  I reviewed pt's medications, allergies, PMH, social hx, family hx, and changes were documented in the history of present illness. Otherwise, unchanged from my initial visit note.  Past Medical History:  Diagnosis Date   Allergy    Anxiety    Cataract    beginning stage 01/22/2019   Chronic kidney disease    enlarge bladder   Diabetes mellitus without complication (HCC)    Type 2   GERD (gastroesophageal reflux disease)    Gout    HOH (hard of hearing)    bilateral, no aids   Hyperlipidemia    Hypertension    OSA (obstructive sleep apnea)    uses CPAP   Seizures (HCC)    last 3 years ago in MAY/ benign brain tumor   Sleep apnea    c pap   Type 2 diabetes mellitus with stage 3 chronic kidney disease, without long-term current use of insulin (HCC) 08/17/2013   Urinary frequency    Past Surgical History:  Procedure Laterality Date   COLONOSCOPY  2020   Dr.Jacobs   COLONOSCOPY WITH PROPOFOL N/A 05/04/2022   Procedure: COLONOSCOPY WITH PROPOFOL;  Surgeon: Sherrilyn Rist, MD;  Location: [facility];  Service: Gastroenterology;  Laterality: N/A;   CRANIOTOMY Right 12/29/2019   Procedure: Craniotomy for Tumor;  Surgeon: Barnett Abu, MD;  Location: [facility];  Service: Neurosurgery;  Laterality: Right;  Craniotomy for Tumor Excision   FOOT SURGERY  10/2009   MYRINGOTOMY Left 02/2005   Dr.Newman   MYRINGOTOMY WITH TUBE PLACEMENT Left 10/11/2022   Procedure: LEFT MYRINGOTOMY WITH TUBE PLACEMENT (BUTTERFLY);  Surgeon: Bud Face, MD;  Location: [facility];  Service: ENT;  Laterality: Left;  Diabetic   POLYPECTOMY  05/04/2022   Procedure: POLYPECTOMY;  Surgeon: Sherrilyn Rist, MD;  Location: [facility];  Service: Gastroenterology;;   TONSILLECTOMY  1953   Social History   Socioeconomic History   Marital status: Married    Spouse name: Not on file   Number of children: Not on  file   Years of education: Not on file   Highest education level: Not on file  Occupational History   Occupation: Midwife    Comment: on STD 10/25/20  Tobacco Use   Smoking status: Never   Smokeless tobacco: Never  Vaping Use   Vaping status: Never Used  Substance and Sexual Activity   Alcohol use: No   Drug use: No   Sexual activity: Not on file  Other Topics Concern  Not on file  Social History Narrative   Lives with wife   Right handed   Drinks 3-4 cups caffeine daily   Social Drivers of Health   Financial Resource Strain: Not on file  Food Insecurity: No Food Insecurity (11/08/2021)   Hunger Vital Sign    Worried About Running Out of Food in the Last Year: Never true    Ran Out of Food in the Last Year: Never true  Transportation Needs: No Transportation Needs (11/08/2021)   PRAPARE - Administrator, Civil Service (Medical): No    Lack of Transportation (Non-Medical): No  Physical Activity: Not on file  Stress: Not on file  Social Connections: Not on file  Intimate Partner Violence: Not on file   Current Outpatient Medications on File Prior to Visit  Medication Sig Dispense Refill   acetaminophen (TYLENOL) 650 MG CR tablet Take 650 mg by mouth every 8 (eight) hours as needed for pain (or headaches).     albuterol (VENTOLIN HFA) 108 (90 Base) MCG/ACT inhaler Inhale into the lungs every 6 (six) hours as needed for wheezing or shortness of breath.     Cholecalciferol (VITAMIN D3) 10000 units capsule Take 10,000 Units by mouth in the morning.     citalopram (CELEXA) 40 MG tablet TAKE 1 TABLET  DAILY FOR MOOD OR CHRONIC ANXIETY                                                       /                                                                   TAKE                                         BY                                                 MOUTH 90 tablet 3   Continuous Glucose Sensor (FREESTYLE LIBRE 3 PLUS SENSOR) MISC 1 each by Does not apply route every 14  (fourteen) days. 6 each 3   fenofibrate 160 MG tablet Take  1 tablet  Daily  for Triglycerides ( Blood Fats)                               /                                                                   TAKE  BY                                                 MOUTH 90 tablet 3   fluticasone (FLONASE) 50 MCG/ACT nasal spray Place 1-2 sprays into both nostrils daily as needed for allergies or rhinitis.     glipiZIDE (GLUCOTROL) 5 MG tablet TAKE 1 TO 2 TABLETS BY MOUTH 3  TIMES DAILY WITH MEALS FOR  DIABETES 540 tablet 3   glucose blood (FREESTYLE LITE) test strip Check blood sugar 1 time daily-[id number] 100 each 5   insulin glargine (LANTUS SOLOSTAR) 100 UNIT/ML Solostar Pen Inject 10 Units into the skin at bedtime. 15 mL 3   Insulin Pen Needle 32G X 4 MM MISC Use 1x a day 100 each 3   levETIRAcetam (KEPPRA) 500 MG tablet TAKE 1 TABLET BY MOUTH TWICE  DAILY 180 tablet 3   metFORMIN (GLUCOPHAGE) 500 MG tablet TAKE 1 TABLET BY MOUTH 3 TIMES  DAILY 270 tablet 3   Na Sulfate-K Sulfate-Mg Sulf 17.5-3.13-1.6 GM/177ML SOLN Take 1 kit by mouth as directed. May use generic SUPREP;NO prior authorizations will be done.Please use Singlecare or GOOD-RX coupon. 354 mL 0   omeprazole (PRILOSEC) 40 MG capsule Take  1 capdsule  Daily to Prevent Heartburn & Indigestion                                                                               /                                                                   TAKE                                         BY                                                 MOUTH 90 capsule 3   rosuvastatin (CRESTOR) 10 MG tablet TAKE 1 TABLET BY MOUTH DAILY FOR CHOLESTEROL 90 tablet 3   Tetrahydroz-Glyc-Hyprom-PEG (VISINE MAXIMUM REDNESS RELIEF) 0.05-0.2-0.36-1 % SOLN Place 1-2 drops into both eyes 3 (three) times daily as needed (dry/irrtated eyes.).     tirzepatide (MOUNJARO) 5 MG/0.5ML Pen Inject 5 mg into the skin once a week. 6 mL 3    triamcinolone ointment (KENALOG) 0.1 % Apply 1 Application topically 2 (two) times daily. 80 g 1   valsartan (DIOVAN) 160 MG tablet Take 1 tablet daily for BP and Diabetic Kidney Protection 90 tablet 3   No current facility-administered medications on file prior to visit.   Allergies  Allergen Reactions   Cardura [Doxazosin Mesylate] Other (See Comments)    Nasal congestion   Codeine Other (See Comments)    The patient passed out   Hytrin [Terazosin] Other (See Comments)    Reaction not recalled   Family History  Problem Relation Age of Onset   Hypertension Mother    GER disease Mother    Hyperlipidemia Father    Hypertension Father    Parkinson's disease Father    Hypertension Brother    Diabetes Maternal Grandmother    Stroke Paternal Grandmother    Diabetes Paternal Grandmother    Stroke Paternal Grandfather    Colon cancer Neg Hx    Colon polyps Neg Hx    Esophageal cancer Neg Hx    Stomach cancer Neg Hx    Rectal cancer Neg Hx    PE: BP 122/70   Pulse 72   Ht 5\' 10"  (1.778 m)   Wt 231 lb 6.4 oz (105 kg)   SpO2 96%   BMI 33.20 kg/m  Wt Readings from Last 10 Encounters:  10/02/23 231 lb 6.4 oz (105 kg)  09/25/23 229 lb 6.4 oz (104.1 kg)  08/16/23 231 lb 9.6 oz (105.1 kg)  08/13/23 233 lb 12.8 oz (106.1 kg)  06/11/23 224 lb 9.6 oz (101.9 kg)  05/29/23 228 lb (103.4 kg)  04/12/23 224 lb 9.6 oz (101.9 kg)  02/13/23 220 lb (99.8 kg)  11/14/22 220 lb (99.8 kg)  10/18/22 216 lb 6.4 oz (98.2 kg)  . Constitutional: overweight, in NAD, + shuffling gait Eyes:EOMI, no exophthalmos ENT: no thyromegaly, no cervical lymphadenopathy Cardiovascular: RRR, No MRG Respiratory: CTA B Musculoskeletal: no deformities Skin:  no rashes Neurological: + mild tremor with outstretched R hand  ASSESSMENT: 1. DM2, insulin-dependent, previously uncontrolled, with long-term complications - CKD stage 3 - DR  2.  Hyperlipidemia  3. Obesity class I  PLAN:  1. Patient with  longstanding uncontrolled, type 2 diabetes, on metformin, sulfonylurea, and long-acting insulin, to which I recommended to add a GLP-1/GIP receptor agonist at last visit.  At that time, she was off Trulicity and we discussed about switching to [facility].  I also recommended to take Lantus once a day as he was taking it only once a week due to misunderstanding instructions.  I advised him to increase the dose as needed.  HbA1c was 8.3% at the previous visit, higher.  He was not clear what could have caused the change.  HbA1c at that time was higher, at 8.3%.  I also recommended a CGM at that time. CGM interpretation; -At today's visit, we reviewed his CGM downloads: It appears that 72% of values are in target range (goal >70%), while 26% are higher than 180 (goal <25%), and 2% are lower than 70 (goal <4%).  The calculated average blood sugar is 154.  The projected HbA1c for the next 3 months (GMI) is 7.0%. -Reviewing the CGM trends, sugars are more variable in today's visit, lower overall, but with increased variability especially after lunch.  He does have some lows especially in the early morning hours.  Upon questioning, he obtained Mounjaro from the pharmacy, but he did not start it due to the presence of glucose, down to 60s. It is unclear exactly how  much Lantus he is taking.  He was extremely confused about Lantus and initially he was injecting 1 dose and throwing away the pen.  This was clarified by the pharmacist and now keeps the pen but he cannot tell me  exactly, she takes, after we revisit the pen together, it may be that he is taking 12 units.  -due to the low blood sugars, at today's visit I recommended to decrease the glipizide to only 1 tablet twice a day before meals and I actually advised him to stop Lantus and try to start Mounjaro instead.  I advised him to let me know if he has more lows.  We went over the instructions several times. - I suggested to:  Patient Instructions  Please  continue: - Metformin 500 mg 2x a day - Glipizide 10 mg 2x a day before meals - Lantus 10 units in am but increase by 2 units every 3 days until sugars are at goal (<130)  Try to start: - Mounjaro 5 mg weekly  Please return in 1.5 months.  - we checked his HbA1c: 7.6% (lower) - advised to check sugars at different times of the day - 4x a day, rotating check times - advised for yearly eye exams >> he is UTD - return to clinic in 1.5 months  2. HL -Latest lipid panel was reviewed from 05/2023: LDL and triglycerides above target, HDL low: Lab Results  Component Value Date   CHOL 156 05/29/2023   HDL 31 (L) 05/29/2023   LDLCALC 95 05/29/2023   TRIG 205 (H) 05/29/2023   CHOLHDL 5.0 (H) 05/29/2023  -He is on Crestor 10 mg daily and fenofibrate 160 mg daily.  I recommended co-Q10 for muscle aches in the past.  3.  Obesity class I -Ozempic was not covered.  He had to start Bydureon as Ozempic was not covered until he tried this.  However, he was not happy with the Bydureon injections, sugars were higher and he gained more weight and wanted to switch back to Trulicity.  We started back on Trulicity but then I suggested Mounjaro.  He did not start this and at last visit he was off Trulicity, also.   That time, I again sent Mounjaro to his pharmacy and advised him to try to start it.  He obtained it from the pharmacy but did not start it due to lows.  At today's visit we will reduce glipizide and Lantus, both with weight inducing properties, and try to start [facility]. -He will 4 pounds before last visit and gained 2 lbs since then  Carlus Pavlov, MD PhD [facility] Endocrinology

## 2023-10-02 NOTE — Patient Instructions (Addendum)
Please continue: - Metformin 500 mg 2-3x a day  Decrease: - Glipizide 5 mg 2x a day before meals  Start: - Mounjaro 5 mg weekly  Stop: - Lantus   Please return in 1-1.5 months.

## 2023-10-31 ENCOUNTER — Other Ambulatory Visit: Payer: Self-pay | Admitting: Adult Health

## 2023-11-01 NOTE — Telephone Encounter (Signed)
 Last seen on 11/14/22 Follow up scheduled on 11/14/23 90 day supply was just sent on 09/06/23, next refill not due to April

## 2023-11-13 NOTE — Progress Notes (Unsigned)
 [facility] [address] [place]. [place] [address] [phone]       OFFICE FOLLOW UP NOTE  [name] Date of Birth:  10-22-1945 Medical Record Number:  [id number]   Referring MD: Brooke Dare  Reason for Referral: Seizure and CPAP   No chief complaint on file.      HPI:   Update 11/14/2023 JM; patient returns for yearly follow up visit.   No seizure activity.  Compliant on Keppra 500 mg twice daily.  Maintains ADLs and IADLs independently as well as driving.  Compliance report ***       History provided for reference purposes only  Update 11/14/2022 JM: Patient returns for yearly CPAP and seizure visit.  Stable from seizure standpoint without any recent seizure activity.  Compliant on Keppra 500 mg BID, tolerating well.  Continues to maintain ADLs and IADLs independently as well as driving.  Past 30-day compliance report shows 4 out of 30 usage days with 4 days greater than 4 hours for 13% compliance.  Average usage 4 hours and 51 minutes.  Residual AHI 0.3.  Pressure in the 95 percentile 10.8 on pressure setting of 6-13 with EPR level 2.  Leaks in the 95 percentile 3.6. He has not been using CPAP recently due to family stressors and the recent death of his brother. Brother was in a nursing home over the past several months, by the time he would get home he would be so exhausted he would fall alseep before putting CPAP on.  He does plan on restarting CPAP as he does notice a difference in his sleep quality and daytime energy levels when using.   Update 11/02/2021 JM: Patient returns for initial CPAP compliance visit unaccompanied.  Previously seen 4 months ago for seizure follow-up and has been stable from that aspect since prior visit.  Compliant on Keppra without side effects and no seizure activity.  Initiated CPAP on 08/31/2021 after completion of HST which showed severe REM sleep dominant sleep apnea, pursued titration study which  confirmed complex sleep apnea by Dr. Vickey Huger.  Review of compliance report shows 27 out of 30 usage days at 90% and 13 days greater than 4 hours with average usage 3 hours and 47 minutes.  Residual AHI 0.6.  Leaks in the 95 percentile 4.4.  Pressure in the 95th percentile 9.0.  On AutoPap 6-13 with EPR 2.  Reports overall tolerating CPAP well with use of nasal pillows. He does report restless sleep, will have nocturia and at times will have to leave his bed to sleep in recliner. He typically does not sleep more than 4 hours per night.    Epworth Sleepiness Scale 4/24 Fatigue severity scale 33/63   Update 07/06/2021 JM: Returns for 92-month seizure follow-up unaccompanied.  Overall stable since prior visit without any reoccurring seizure activity.  Remains on Keppra 500 mg twice daily tolerating without side effects.  Continues to live with his wife -able to maintain ADLs and IADLs independently.  He drives his personal car without difficulty.  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  No further concerns at this time.  Update 12/29/2020 JM: Mr. Shin returns for seizure follow-up after initial consult visit with Dr. Pearlean Brownie 2 months ago.  He is unaccompanied at today's visit.  EEG 12/13/2020 did show mild irritability which increases predisposition of seizures and recommended continuation of Keppra.  He has remained on Keppra tolerating without side effects.  Denies  any additional seizure activity.  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  He does report prior history of sleep apnea over 10 years ago previously using CPAP but he stopped using after weight loss.  He denies any witnessed apnea but does report snoring, occasional fatigue and nocturia.  He questions possible need of repeat sleep study.   Consult visit 10/25/2020 Dr. Pearlean Brownie: Mr. Lafoy is a pleasant 78 year old Caucasian  male seen today for initial office consultation visit for seizure.  History is obtained from the patient and review of electronic medical records and I personally reviewed pertinent imaging films in PACS.  He has past medical history of hypertension, hyperlipidemia, prediabetes, obstructive sleep apnea on CPAP, gout, anxiety who was recently diagnosed with large 7 x 2.9 x 3.1 cm right frontal convexity meningioma in May 2021.  At that time he presented with left leg pain and had a fall.  Routine brain imaging showed a large right frontal meningioma.  He underwent gross total excision of the meningioma on 12/29/2019 by Dr. Danielle Dess and did well postoperatively.  He was on Decadron which was tapered he was also given Keppra 500 mg twice daily which he took for 6 months and seizure prophylaxis and overall tolerated very well though his wife had noticed mild personality change.  He did discontinue the Keppra.  He presented on 10/15/2020 with episode of involuntary movements on his jaw and difficulty controlling it after eating food.  This lasted only a couple of minutes.  Wife noticed that he was rhythmically chewing and could not stop it and was choking.  He had trouble speaking and try to speak but words did not come out.  He did not have any alteration of consciousness or any tongue bite.  There was no extremity tonic-clonic movements.  Patient had no obvious trigger for this episode.  He does have some habitual nocturia and wakes up every 2 3 hours but the patient states he does sleep 6 to 8 hours every night.  He has no prior history of head injury, seizures, stroke, TIA, headaches or any other neurological problems.  There is no family history of epilepsy.  Patient has since been restarted on Keppra 500 mg twice daily which is tolerating well without any side effects.  He had an EEG done on 10/15/2020 which was also normal.  MRI scan of the brain done on 10/15/2020 showed no residual meningioma tumor with postoperative  changes of craniotomy in the right frontal region.  No acute abnormality.  Overall no significant change compared to previous MRI from September 2021.  Patient denies drinking alcohol ,smoking marijuana or using any drugs of abuse.  ROS:   14 system review of systems is positive for those listed in HPI and all other systems negative  PMH:  Past Medical History:  Diagnosis Date   Allergy    Anxiety    Cataract    beginning stage 01/22/2019   Chronic kidney disease    enlarge bladder   Diabetes mellitus without complication (HCC)    Type 2   GERD (gastroesophageal reflux disease)    Gout    HOH (hard of hearing)    bilateral, no aids   Hyperlipidemia    Hypertension    OSA (obstructive sleep apnea)    uses CPAP   Seizures (HCC)    last 3 years ago in MAY/ benign brain tumor   Sleep apnea  c pap   Type 2 diabetes mellitus with stage 3 chronic kidney disease, without long-term current use of insulin (HCC) 08/17/2013   Urinary frequency     Social History:  Social History   Socioeconomic History   Marital status: Married    Spouse name: Not on file   Number of children: Not on file   Years of education: Not on file   Highest education level: Not on file  Occupational History   Occupation: Midwife    Comment: on STD 10/25/20  Tobacco Use   Smoking status: Never   Smokeless tobacco: Never  Vaping Use   Vaping status: Never Used  Substance and Sexual Activity   Alcohol use: No   Drug use: No   Sexual activity: Not on file  Other Topics Concern   Not on file  Social History Narrative   Lives with wife   Right handed   Drinks 3-4 cups caffeine daily   Social Drivers of Health   Financial Resource Strain: Not on file  Food Insecurity: No Food Insecurity (11/08/2021)   Hunger Vital Sign    Worried About Running Out of Food in the Last Year: Never true    Ran Out of Food in the Last Year: Never true  Transportation Needs: No Transportation Needs (11/08/2021)    PRAPARE - Administrator, Civil Service (Medical): No    Lack of Transportation (Non-Medical): No  Physical Activity: Not on file  Stress: Not on file  Social Connections: Not on file  Intimate Partner Violence: Not on file    Medications:   Current Outpatient Medications on File Prior to Visit  Medication Sig Dispense Refill   acetaminophen (TYLENOL) 650 MG CR tablet Take 650 mg by mouth every 8 (eight) hours as needed for pain (or headaches).     albuterol (VENTOLIN HFA) 108 (90 Base) MCG/ACT inhaler Inhale into the lungs every 6 (six) hours as needed for wheezing or shortness of breath.     Cholecalciferol (VITAMIN D3) 10000 units capsule Take 10,000 Units by mouth in the morning.     citalopram (CELEXA) 40 MG tablet TAKE 1 TABLET  DAILY FOR MOOD OR CHRONIC ANXIETY                                                       /                                                                   TAKE                                         BY                                                 MOUTH 90 tablet 3   Continuous Glucose Sensor (FREESTYLE LIBRE  3 PLUS SENSOR) MISC 1 each by Does not apply route every 14 (fourteen) days. 6 each 3   fenofibrate 160 MG tablet Take  1 tablet  Daily  for Triglycerides ( Blood Fats)                               /                                                                   TAKE                                         BY                                                 MOUTH 90 tablet 3   fluticasone (FLONASE) 50 MCG/ACT nasal spray Place 1-2 sprays into both nostrils daily as needed for allergies or rhinitis.     glipiZIDE (GLUCOTROL) 5 MG tablet TAKE 1  TABLET BY MOUTH 2 TIMES DAILY WITH MEALS FOR  DIABETES     glucose blood (FREESTYLE LITE) test strip Check blood sugar 1 time daily-[id number] 100 each 5   Insulin Pen Needle 32G X 4 MM MISC Use 1x a day 100 each 3   levETIRAcetam (KEPPRA) 500 MG tablet TAKE 1 TABLET BY MOUTH TWICE  DAILY 180 tablet 3    metFORMIN (GLUCOPHAGE) 500 MG tablet TAKE 1 TABLET BY MOUTH 3 TIMES  DAILY 270 tablet 3   Na Sulfate-K Sulfate-Mg Sulf 17.5-3.13-1.6 GM/177ML SOLN Take 1 kit by mouth as directed. May use generic SUPREP;NO prior authorizations will be done.Please use Singlecare or GOOD-RX coupon. 354 mL 0   omeprazole (PRILOSEC) 40 MG capsule Take  1 capdsule  Daily to Prevent Heartburn & Indigestion                                                                               /                                                                   TAKE                                         BY  MOUTH 90 capsule 3   rosuvastatin (CRESTOR) 10 MG tablet TAKE 1 TABLET BY MOUTH DAILY FOR CHOLESTEROL 90 tablet 3   Tetrahydroz-Glyc-Hyprom-PEG (VISINE MAXIMUM REDNESS RELIEF) 0.05-0.2-0.36-1 % SOLN Place 1-2 drops into both eyes 3 (three) times daily as needed (dry/irrtated eyes.).     tirzepatide (MOUNJARO) 5 MG/0.5ML Pen Inject 5 mg into the skin once a week. 6 mL 3   triamcinolone ointment (KENALOG) 0.1 % Apply 1 Application topically 2 (two) times daily. 80 g 1   valsartan (DIOVAN) 160 MG tablet Take 1 tablet daily for BP and Diabetic Kidney Protection 90 tablet 3   No current facility-administered medications on file prior to visit.    Allergies:   Allergies  Allergen Reactions   Cardura [Doxazosin Mesylate] Other (See Comments)    Nasal congestion   Codeine Other (See Comments)    The patient passed out   Hytrin [Terazosin] Other (See Comments)    Reaction not recalled    Physical Exam There were no vitals filed for this visit.   There is no height or weight on file to calculate BMI.    General: Mildly obese pleasant elderly Caucasian male seated, in no evident distress Head: head normocephalic and atraumatic.   Neck: supple with no carotid or supraclavicular bruits Cardiovascular: regular rate and rhythm, no murmurs Musculoskeletal: no deformity.  Right  frontal craniotomy scar from meningioma surgery Skin:  no rash/petichiae Vascular:  Normal pulses all extremities  Neurologic Exam Mental Status: Awake and fully alert. Oriented to place and time. Recent and remote memory intact. Attention span, concentration and fund of knowledge appropriate. Mood and affect appropriate.  Cranial Nerves: Pupils equal, briskly reactive to light. Extraocular movements full without nystagmus. Visual fields full to confrontation. Hearing intact.  Mild left lower facial asymmetry.  Sensation intact. Face, tongue, palate moves normally and symmetrically.  Motor: Normal bulk and tone. Normal strength in all tested extremity muscles. Sensory.: intact to touch , pinprick , position and vibratory sensation.  Coordination: Rapid alternating movements normal in all extremities. Finger-to-nose and heel-to-shin performed accurately bilaterally. Gait and Station: Arises from chair without difficulty. Stance is normal. Gait demonstrates normal stride length and balance without use of assistive device.  Able to heel, toe and tandem walk without difficulty.  Reflexes: 1+ and symmetric. Toes downgoing.        ASSESSMENT/PLAN: 78 year old Caucasian male with solitary episode of simple partial seizure in February 2022 likely symptomatic from his right frontal meningioma surgery from May 2021.  EEG 01/12/2021 abnormal with mild irritability of the brain on the right side which may increase predisposition for seizures however no definite seizure activity noted.  Longstanding history of sleep apnea not on CPAP and has since established care with Dr. Vickey Huger and restarting CPAP on 08/31/2021     Simple partial seizure -Continue Keppra 500 mg twice daily for seizure prophylaxis - refill provided -Routine lab work completed by PCP - discussed seizure provoking stimuli like sleep deprivation, medication noncompliance, irregular eating and sleeping habits and extremes of exertion and to  avoid stimulants like alcohol, marijuana and substance abuse agents.    Complex sleep apnea -Low compliance due to recent family stressors.  Optimal residual AHI with usage.  Continue current pressure settings.  Discussed importance of restarting CPAP and ensuring nightly usage to >4 hours per night.  Continue to follow with DME company for any needed supplies or CPAP related concerns -Started CPAP 08/31/2021    Follow-up in 1 year for both CPAP  and seizure follow-up or call earlier if needed   No orders of the defined types were placed in this encounter.   No orders of the defined types were placed in this encounter.    CC:  Lucky Cowboy, MD    I spent 22 minutes of face-to-face and non-face-to-face time with patient.  This included previsit chart review, lab review, study review, electronic health record documentation, patient education and discussion regarding above diagnoses and treatment plan and answered all other questions to patient's satisfaction  Ihor Austin, [facility]  [facility] [address] [place], [place] [address]  Phone [phone] Fax [phone] Note: This document was prepared with digital dictation and possible smart phrase technology. Any transcriptional errors that result from this process are unintentional.

## 2023-11-14 ENCOUNTER — Encounter: Payer: Self-pay | Admitting: Adult Health

## 2023-11-14 ENCOUNTER — Ambulatory Visit (INDEPENDENT_AMBULATORY_CARE_PROVIDER_SITE_OTHER): Payer: Medicare Other | Admitting: Adult Health

## 2023-11-14 VITALS — BP 127/67 | HR 75 | Ht 70.0 in | Wt 234.0 lb

## 2023-11-14 DIAGNOSIS — G4739 Other sleep apnea: Secondary | ICD-10-CM

## 2023-11-14 DIAGNOSIS — Z9989 Dependence on other enabling machines and devices: Secondary | ICD-10-CM | POA: Diagnosis not present

## 2023-11-14 DIAGNOSIS — G40909 Epilepsy, unspecified, not intractable, without status epilepticus: Secondary | ICD-10-CM | POA: Diagnosis not present

## 2023-11-14 MED ORDER — LEVETIRACETAM 500 MG PO TABS: 500.0000 mg | ORAL_TABLET | Freq: Two times a day (BID) | ORAL | 3 refills | Status: DC

## 2023-11-14 NOTE — Patient Instructions (Signed)
 Your Plan:  Continue Keppra 500 mg twice daily for seizure prevention  Please ensure you are using your CPAP nightly with greater than 4 hours per night for optimal benefit  Continue to follow with your DME company [employer] for any needed supplies or CPAP related concerns     Follow-up in 1 year or call earlier if needed     Thank you for coming to see [place] at [facility]. I hope we have been able to provide you high quality care today.  You may receive a patient satisfaction survey over the next few weeks. We would appreciate your feedback and comments so that we may continue to improve ourselves and the health of our patients.

## 2023-11-14 NOTE — Progress Notes (Signed)
 [name], CMA  Jeffery Reed Jeffery Reed, Jeffery Reed; Jeffery Reed, [place] New orders have been placed for the above pt, DOB: Nov 03, 2045 Thanks

## 2023-11-19 ENCOUNTER — Encounter: Payer: Self-pay | Admitting: Nurse Practitioner

## 2023-11-19 ENCOUNTER — Ambulatory Visit (INDEPENDENT_AMBULATORY_CARE_PROVIDER_SITE_OTHER): Payer: Medicare Other | Admitting: Nurse Practitioner

## 2023-11-19 VITALS — BP 124/78 | HR 84 | Temp 98.4°F | Ht 68.5 in | Wt 222.4 lb

## 2023-11-19 DIAGNOSIS — E785 Hyperlipidemia, unspecified: Secondary | ICD-10-CM

## 2023-11-19 DIAGNOSIS — E1169 Type 2 diabetes mellitus with other specified complication: Secondary | ICD-10-CM | POA: Diagnosis not present

## 2023-11-19 DIAGNOSIS — E1122 Type 2 diabetes mellitus with diabetic chronic kidney disease: Secondary | ICD-10-CM | POA: Diagnosis not present

## 2023-11-19 DIAGNOSIS — Z7985 Long-term (current) use of injectable non-insulin antidiabetic drugs: Secondary | ICD-10-CM

## 2023-11-19 DIAGNOSIS — N183 Type 2 diabetes mellitus with diabetic chronic kidney disease: Secondary | ICD-10-CM

## 2023-11-19 DIAGNOSIS — I10 Essential (primary) hypertension: Secondary | ICD-10-CM | POA: Diagnosis not present

## 2023-11-19 DIAGNOSIS — Z7984 Long term (current) use of oral hypoglycemic drugs: Secondary | ICD-10-CM | POA: Diagnosis not present

## 2023-11-19 DIAGNOSIS — G4733 Obstructive sleep apnea (adult) (pediatric): Secondary | ICD-10-CM

## 2023-11-19 DIAGNOSIS — K219 Gastro-esophageal reflux disease without esophagitis: Secondary | ICD-10-CM | POA: Diagnosis not present

## 2023-11-19 NOTE — Assessment & Plan Note (Signed)
 Patient currently maintained on CPAP and followed by neurology.  Continue using device as recommended and follow-up with neurology as recommended

## 2023-11-19 NOTE — Assessment & Plan Note (Signed)
 History of same.  Patient currently maintained on omeprazole 40 mg daily.  Continue

## 2023-11-19 NOTE — Assessment & Plan Note (Signed)
 Patient currently maintained on valsartan 160 mg daily.  Blood pressure well-controlled in office.  Patient does not need refill at this juncture

## 2023-11-19 NOTE — Assessment & Plan Note (Signed)
 Patient currently maintained on glipizide 5 mg twice daily, metformin 1000 mg daily, tirzepatide 5 mg weekly.  Does have a CGM and is followed by endocrinology.  Continue taking medication as prescribed and follow-up with endocrinology as recommended

## 2023-11-19 NOTE — Progress Notes (Signed)
 New Patient Office Visit  Subjective    Patient ID: Jeffery Reed, male    DOB: [birth year]  Age: 78 y.o. MRN: [id number]  CC:  Chief Complaint  Patient presents with   Establish Care    General check up. Has not seen eye doctor for diabetic eye exams in 2 years but would like recommendations     HPI Jeffery Reed presents to establish care  Lst CPE was 05/29/2023   MDD/anxiety: Patient currently maintained on citalopram 40 mg daily. Does well with th emood. States that since the brain tumor he is more on edge  HLD: Patient currently maintained on fenofibrate 160 mg daily and Crestor 10 mg daily  DM2: Patient currently maintained on glipizide 5 mg twice daily, metformin 1500 mg total, tirzepatide 5 mg weekly.  He is followed by endocrinology.  CKD: Patient since stable chronic stage III chronic kidney disease.  He is followed by nephrology once a year.  Seizures: Patient currently maintained on Keppra 500 mg twice daily and followed by neurology  OSA: CPAP and is followed by neurology  HTN: on valsartan 160. He does have a cuff but does not check at home  Colonoscopy:05/04/2022, no recall  PSA: due in oct  Tdap: unsure  Flu: up to date COVID: original series  PNA: needs updating Shingles: needs updating   Advanced directive: he does have an advanced directvie      Outpatient Encounter Medications as of 11/19/2023  Medication Sig   acetaminophen (TYLENOL) 650 MG CR tablet Take 650 mg by mouth every 8 (eight) hours as needed for pain (or headaches).   albuterol (VENTOLIN HFA) 108 (90 Base) MCG/ACT inhaler Inhale into the lungs every 6 (six) hours as needed for wheezing or shortness of breath.   Cholecalciferol (VITAMIN D3) 10000 units capsule Take 10,000 Units by mouth in the morning.   citalopram (CELEXA) 40 MG tablet TAKE 1 TABLET  DAILY FOR MOOD OR CHRONIC ANXIETY                                                       /                                                                    TAKE                                         BY                                                 MOUTH   Continuous Glucose Sensor (FREESTYLE LIBRE 3 PLUS SENSOR) MISC 1 each by Does not apply route every 14 (fourteen) days.   fenofibrate 160 MG tablet Take  1 tablet  Daily  for Triglycerides ( Blood Fats)                               /  TAKE                                         BY                                                 MOUTH   fluticasone (FLONASE) 50 MCG/ACT nasal spray Place 1-2 sprays into both nostrils daily as needed for allergies or rhinitis.   glipiZIDE (GLUCOTROL) 5 MG tablet TAKE 1  TABLET BY MOUTH 2 TIMES DAILY WITH MEALS FOR  DIABETES   glucose blood (FREESTYLE LITE) test strip Check blood sugar 1 time daily-[id number]   Insulin Pen Needle 32G X 4 MM MISC Use 1x a day   levETIRAcetam (KEPPRA) 500 MG tablet Take 1 tablet (500 mg total) by mouth 2 (two) times daily.   metFORMIN (GLUCOPHAGE) 500 MG tablet TAKE 1 TABLET BY MOUTH 3 TIMES  DAILY (Patient taking differently: Take 500 mg by mouth daily with breakfast.)   omeprazole (PRILOSEC) 40 MG capsule Take  1 capdsule  Daily to Prevent Heartburn & Indigestion                                                                               /                                                                   TAKE                                         BY                                                 MOUTH   rosuvastatin (CRESTOR) 10 MG tablet TAKE 1 TABLET BY MOUTH DAILY FOR CHOLESTEROL   Tetrahydroz-Glyc-Hyprom-PEG (VISINE MAXIMUM REDNESS RELIEF) 0.05-0.2-0.36-1 % SOLN Place 1-2 drops into both eyes 3 (three) times daily as needed (dry/irrtated eyes.).   tirzepatide (MOUNJARO) 5 MG/0.5ML Pen Inject 5 mg into the skin once a week.   triamcinolone ointment (KENALOG) 0.1 % Apply 1 Application topically 2 (two) times daily.   valsartan (DIOVAN) 160  MG tablet Take 1 tablet daily for BP and Diabetic Kidney Protection   [DISCONTINUED] Na Sulfate-K Sulfate-Mg Sulf 17.5-3.13-1.6 GM/177ML SOLN Take 1 kit by mouth as directed. May use generic SUPREP;NO prior authorizations will be done.Please use Singlecare or GOOD-RX coupon.   No facility-administered encounter medications on file as of 11/19/2023.    Past Medical History:  Diagnosis Date  Allergy    Anxiety    Cataract    beginning stage 01/22/2019   Chronic kidney disease    enlarge bladder   Diabetes mellitus without complication (HCC)    Type 2   GERD (gastroesophageal reflux disease)    Gout    HOH (hard of hearing)    bilateral, no aids   Hyperlipidemia    Hypertension    OSA (obstructive sleep apnea)    uses CPAP   Seizures (HCC)    last 3 years ago in MAY/ benign brain tumor   Sleep apnea    c pap   Type 2 diabetes mellitus with stage 3 chronic kidney disease, without long-term current use of insulin (HCC) 08/17/2013   Urinary frequency     Past Surgical History:  Procedure Laterality Date   COLONOSCOPY  2020   Dr.Jacobs   COLONOSCOPY WITH PROPOFOL N/A 05/04/2022   Procedure: COLONOSCOPY WITH PROPOFOL;  Surgeon: Sherrilyn Rist, MD;  Location: [facility];  Service: Gastroenterology;  Laterality: N/A;   CRANIOTOMY Right 12/29/2019   Procedure: Craniotomy for Tumor;  Surgeon: Barnett Abu, MD;  Location: [facility] OR;  Service: Neurosurgery;  Laterality: Right;  Craniotomy for Tumor Excision   FOOT SURGERY  10/2009   MYRINGOTOMY Left 02/2005   Dr.Newman   MYRINGOTOMY WITH TUBE PLACEMENT Left 10/11/2022   Procedure: LEFT MYRINGOTOMY WITH TUBE PLACEMENT (BUTTERFLY);  Surgeon: Bud Face, MD;  Location: [facility];  Service: ENT;  Laterality: Left;  Diabetic   POLYPECTOMY  05/04/2022   Procedure: POLYPECTOMY;  Surgeon: Sherrilyn Rist, MD;  Location: [facility];  Service: Gastroenterology;;   TONSILLECTOMY  1953    Family History  Problem Relation  Age of Onset   Hypertension Mother    GER disease Mother    Hyperlipidemia Father    Hypertension Father    Parkinson's disease Father    Hypertension Brother    Diabetes Maternal Grandmother    Stroke Paternal Grandmother    Diabetes Paternal Grandmother    Stroke Paternal Grandfather    Colon cancer Neg Hx    Colon polyps Neg Hx    Esophageal cancer Neg Hx    Stomach cancer Neg Hx    Rectal cancer Neg Hx     Social History   Socioeconomic History   Marital status: Married    Spouse name: Peggy   Number of children: 1   Years of education: Not on file   Highest education level: Not on file  Occupational History   Occupation: Midwife    Comment: on STD 10/25/20  Tobacco Use   Smoking status: Never   Smokeless tobacco: Never  Vaping Use   Vaping status: Never Used  Substance and Sexual Activity   Alcohol use: No   Drug use: No   Sexual activity: Not on file  Other Topics Concern   Not on file  Social History Narrative   Lives with wife   Right handed   Drinks 3-4 cups caffeine daily      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    Social Drivers of Corporate investment banker Strain: Not on file  Food Insecurity: No Food Insecurity (11/08/2021)   Hunger Vital Sign    Worried About Running Out of Food in the Last Year: Never true    Ran Out of Food in the Last Year: Never true  Transportation Needs: No Transportation Needs (11/08/2021)   PRAPARE - Transportation  Lack of Transportation (Medical): No    Lack of Transportation (Non-Medical): No  Physical Activity: Not on file  Stress: Not on file  Social Connections: Not on file  Intimate Partner Violence: Not on file    Review of Systems  Constitutional:  Negative for chills and fever.  Respiratory:  Negative for shortness of breath.   Cardiovascular:  Negative for chest pain and leg swelling.  Gastrointestinal:  Negative for abdominal pain, blood in stool, constipation, diarrhea, nausea and  vomiting.       BM daily   Genitourinary:  Positive for frequency (has been evaluated by urology). Negative for dysuria and hematuria.  Neurological:  Negative for dizziness, tingling and headaches.  Psychiatric/Behavioral:  Negative for hallucinations and suicidal ideas.         Objective    BP 124/78   Pulse 84   Temp 98.4 F (36.9 C) (Oral)   Ht 5' 8.5" (1.74 m)   Wt 222 lb 6.4 oz (100.9 kg)   SpO2 96%   BMI 33.32 kg/m   Physical Exam Vitals and nursing note reviewed.  Constitutional:      Appearance: Normal appearance.  HENT:     Right Ear: Tympanic membrane, ear canal and external ear normal.     Left Ear: Tympanic membrane, ear canal and external ear normal.     Mouth/Throat:     Mouth: Mucous membranes are moist.     Pharynx: Oropharynx is clear.  Eyes:     Extraocular Movements: Extraocular movements intact.     Pupils: Pupils are equal, round, and reactive to light.  Cardiovascular:     Rate and Rhythm: Normal rate and regular rhythm.     Pulses: Normal pulses.     Heart sounds: Normal heart sounds.  Pulmonary:     Effort: Pulmonary effort is normal.     Breath sounds: Normal breath sounds.  Abdominal:     General: Bowel sounds are normal.  Musculoskeletal:     Right lower leg: No edema.     Left lower leg: No edema.  Lymphadenopathy:     Cervical: No cervical adenopathy.  Skin:    General: Skin is warm.  Neurological:     General: No focal deficit present.     Mental Status: He is alert.     Deep Tendon Reflexes:     Reflex Scores:      Bicep reflexes are 2+ on the right side and 2+ on the left side.      Patellar reflexes are 2+ on the right side and 2+ on the left side.    Comments: Bilateral upper and lower extremity strength 5/5  Psychiatric:        Mood and Affect: Mood normal.        Behavior: Behavior normal.        Thought Content: Thought content normal.        Judgment: Judgment normal.         Assessment & Plan:   Problem  List Items Addressed This Visit       Cardiovascular and Mediastinum   Essential hypertension - Primary   Patient currently maintained on valsartan 160 mg daily.  Blood pressure well-controlled in office.  Patient does not need refill at this juncture      Relevant Orders   CBC   Comprehensive metabolic panel with GFR     Respiratory   OSA (obstructive sleep apnea)   Patient currently maintained on CPAP and followed  by neurology.  Continue using device as recommended and follow-up with neurology as recommended      Relevant Orders   CBC     Digestive   GERD (gastroesophageal reflux disease)   History of same.  Patient currently maintained on omeprazole 40 mg daily.  Continue        Endocrine   Hyperlipidemia associated with type 2 diabetes mellitus (HCC)   Patient currently maintained on fenofibrate 160 mg daily and Crestor 10 mg daily.  Did review most recent lipid panel.  Continue taking medication as prescribed      Type 2 diabetes mellitus with stage 3 chronic kidney disease, without long-term current use of insulin (HCC)   Patient currently maintained on glipizide 5 mg twice daily, metformin 1000 mg daily, tirzepatide 5 mg weekly.  Does have a CGM and is followed by endocrinology.  Continue taking medication as prescribed and follow-up with endocrinology as recommended      Relevant Orders   CBC   Comprehensive metabolic panel with GFR   CKD stage 3 due to type 2 diabetes mellitus (HCC)    Return in about 6 months (around 05/20/2024) for CPE and Labs.   Audria Nine, NP

## 2023-11-19 NOTE — Assessment & Plan Note (Signed)
 Patient currently maintained on fenofibrate 160 mg daily and Crestor 10 mg daily.  Did review most recent lipid panel.  Continue taking medication as prescribed

## 2023-11-19 NOTE — Patient Instructions (Signed)
 Nice to see you today I will be in touch with the labs once I have reviewed them  Follow up with me in approx 6 months for your physical and whole panel of labs

## 2023-11-20 LAB — COMPREHENSIVE METABOLIC PANEL WITH GFR
ALT: 17 U/L (ref 0–53)
AST: 27 U/L (ref 0–37)
Albumin: 4.1 g/dL (ref 3.5–5.2)
Alkaline Phosphatase: 48 U/L (ref 39–117)
BUN: 20 mg/dL (ref 6–23)
CO2: 24 meq/L (ref 19–32)
Calcium: 8.9 mg/dL (ref 8.4–10.5)
Chloride: 110 meq/L (ref 96–112)
Creatinine, Ser: 2.21 mg/dL — ABNORMAL HIGH (ref 0.40–1.50)
GFR: 28.26 mL/min — ABNORMAL LOW (ref >60.00)
Glucose, Bld: 150 mg/dL — ABNORMAL HIGH (ref 70–99)
Potassium: 3.8 meq/L (ref 3.5–5.1)
Sodium: 142 meq/L (ref 135–145)
Total Bilirubin: 0.4 mg/dL (ref 0.2–1.2)
Total Protein: 6.7 g/dL (ref 6.0–8.3)

## 2023-11-20 LAB — CBC
HCT: 37.1 % — ABNORMAL LOW (ref 39.0–52.0)
Hemoglobin: 12.6 g/dL — ABNORMAL LOW (ref 13.0–17.0)
MCHC: 33.8 g/dL (ref 30.0–36.0)
MCV: 87.9 fl (ref 78.0–100.0)
Platelets: 153 10*3/uL (ref 150.0–400.0)
RBC: 4.22 Mil/uL (ref 4.22–5.81)
RDW: 13.6 % (ref 11.5–15.5)
WBC: 6.3 10*3/uL (ref 4.0–10.5)

## 2023-11-21 ENCOUNTER — Encounter: Payer: Self-pay | Admitting: Nurse Practitioner

## 2023-11-21 ENCOUNTER — Other Ambulatory Visit: Payer: Self-pay | Admitting: Nurse Practitioner

## 2023-11-21 ENCOUNTER — Ambulatory Visit (INDEPENDENT_AMBULATORY_CARE_PROVIDER_SITE_OTHER)

## 2023-11-21 DIAGNOSIS — R7989 Other specified abnormal findings of blood chemistry: Secondary | ICD-10-CM | POA: Diagnosis not present

## 2023-11-21 LAB — IBC + FERRITIN
Ferritin: 52.2 ng/mL (ref 22.0–322.0)
Iron: 101 ug/dL (ref 42–165)
Saturation Ratios: 22.8 % (ref 20.0–50.0)
TIBC: 443.8 ug/dL (ref 250.0–450.0)
Transferrin: 317 mg/dL (ref 212.0–360.0)

## 2023-11-22 ENCOUNTER — Encounter: Payer: Self-pay | Admitting: Nurse Practitioner

## 2023-11-26 ENCOUNTER — Ambulatory Visit: Payer: Medicare Other | Admitting: Internal Medicine

## 2023-11-26 NOTE — Progress Notes (Deleted)
 Patient ID: Jeffery Reed, male   DOB: May 16, 1946, 78 y.o.   MRN: [id number]   HPI: Jeffery Reed is a 78 y.o.-year-old male, initially referred by his PCP, Lucky Cowboy, MD, returning for follow-up for DM2, dx in 2018, insulin-dependent since 12/2019, uncontrolled, with complications (CKD stage III, diabetic retinopathy).  Last visit 9 days ago.  Interim history: No blurry vision, nausea, chest pain.  Reviewed HbA1c levels: Lab Results  Component Value Date   HGBA1C 7.6 (A) 10/02/2023   HGBA1C 8.3 (A) 08/13/2023   HGBA1C 8.1 (H) 06/11/2023   HGBA1C 8.2 (H) 05/29/2023   HGBA1C 8.0 (H) 02/13/2023   HGBA1C 6.0 (H) 10/18/2022   HGBA1C 5.9 (A) 10/12/2022   HGBA1C 6.3 (H) 07/05/2022   HGBA1C 6.8 (H) 03/29/2022   HGBA1C 6.8 (A) 01/23/2022   He was previously on: - Metformin ER 1000 mg 2x a day, with meals >> ... >> 500 mg 2-3x a day  - Glipizide 05-25-09 >> .Marland Kitchen. 10 mg 2x a day, before meals - Trulicity >> Bydureon y per insurance preference >> Trulicity 4.5 mg weekly >> he did not start Mounjaro as he was not given this from the pharmacy (unclear why) but he did stop Trulicity... - Tresiba 10 >> 8 >> Lantus 8  >> 4-6 units at bedtime >> stopped 04/2020  >> restarted 07/2023, but he actually takes it once a week! Ozempic was not covered in 2024.  At last visit he was on: - Metformin 500 mg 2x a day - Glipizide 10 mg 2x a day before meals - Mounjaro 5 mg weekly -he obtains this from the pharmacy but is not taking it as he had some lower blood sugars, in the 60s - Lantus 10 units in am [increase by 2 units every 3 days until sugars are at goal (<130)] -he tells me that he is taking 0.2 units, which I advised him it is not possible.  I believe he is taking 12 units, but this is unclear...  At last visit I recommended: - Metformin 500 mg 2x a day - Glipizide 5 mg 2x a day before meals - Mounjaro 5 mg weekly - started 09/2023 We stopped Lantus 09/2023.  Pt checks his sugars >4x a  day:  Previously:  Previously:   Lowest sugar was 60s >> 65; he has hypoglycemia awareness at 100. Highest sugar was 300s >> 300s.  Glucometer: Freestyle  Pt's meals are: - Breakfast:  Instant oatmeal or Jimmy [employer] sausage bisuit or scrambled eggs - Lunch: tomato sandwich, ham - Dinner: larger meal: potatoes; spaghetti, lasagna, chicken, veggies: green beans, corn, broccoli - Snacks:potato chips, apples Diet sodas, unsweet tea,  -+ CKD stage 3b-sees nephrology, last BUN/creatinine:  Lab Results  Component Value Date   BUN 20 11/19/2023   BUN 17 05/29/2023   CREATININE 2.21 (H) 11/19/2023   CREATININE 1.78 (H) 05/29/2023   Lab Results  Component Value Date   MICRALBCREAT 31 (H) 05/29/2023   MICRALBCREAT 15 03/29/2022   MICRALBCREAT 3 03/03/2021   MICRALBCREAT 6 03/02/2020   MICRALBCREAT 4 12/12/2018   MICRALBCREAT 6 10/23/2017   MICRALBCREAT 3 09/21/2016   MICRALBCREAT 3 09/03/2015   MICRALBCREAT 3.8 08/19/2014   MICRALBCREAT 5.6 08/18/2013  On valsartan 320.  -+ HL; last set of lipids: Lab Results  Component Value Date   CHOL 156 05/29/2023   HDL 31 (L) 05/29/2023   LDLCALC 95 05/29/2023   TRIG 205 (H) 05/29/2023   CHOLHDL 5.0 (H) 05/29/2023  On Crestor  10, fenofibrate 160.  - last eye exam was on 05/2023: No DR; prev.+ DR, + small cataract. [facility].  -+ Mild, occasional numbness and tingling in his feet.  Foot exam performed 05/29/2023.  Pt has no FH of DM.  He also has HTN, OSA. Patient has a history of meningioma surgery in 2021.  He recovered well afterwards.  However, he had a seizure in 2022  >> started Keppra.  He is a retired IT sales professional.  During the winter, he drives a schoolbus.   ROS: + See HPI  I reviewed pt's medications, allergies, PMH, social hx, family hx, and changes were documented in the history of present illness. Otherwise, unchanged from my initial visit note.  Past Medical History:  Diagnosis Date   Allergy     Anxiety    Cataract    beginning stage 01/22/2019   Chronic kidney disease    enlarge bladder   Diabetes mellitus without complication (HCC)    Type 2   GERD (gastroesophageal reflux disease)    Gout    HOH (hard of hearing)    bilateral, no aids   Hyperlipidemia    Hypertension    OSA (obstructive sleep apnea)    uses CPAP   Seizures (HCC)    last 3 years ago in MAY/ benign brain tumor   Sleep apnea    c pap   Type 2 diabetes mellitus with stage 3 chronic kidney disease, without long-term current use of insulin (HCC) 08/17/2013   Urinary frequency    Past Surgical History:  Procedure Laterality Date   COLONOSCOPY  2020   Dr.Jacobs   COLONOSCOPY WITH PROPOFOL N/A 05/04/2022   Procedure: COLONOSCOPY WITH PROPOFOL;  Surgeon: Sherrilyn Rist, MD;  Location: [facility];  Service: Gastroenterology;  Laterality: N/A;   CRANIOTOMY Right 12/29/2019   Procedure: Craniotomy for Tumor;  Surgeon: Barnett Abu, MD;  Location: [facility];  Service: Neurosurgery;  Laterality: Right;  Craniotomy for Tumor Excision   FOOT SURGERY  10/2009   MYRINGOTOMY Left 02/2005   Dr.Newman   MYRINGOTOMY WITH TUBE PLACEMENT Left 10/11/2022   Procedure: LEFT MYRINGOTOMY WITH TUBE PLACEMENT (BUTTERFLY);  Surgeon: Bud Face, MD;  Location: [facility];  Service: ENT;  Laterality: Left;  Diabetic   POLYPECTOMY  05/04/2022   Procedure: POLYPECTOMY;  Surgeon: Sherrilyn Rist, MD;  Location: [facility];  Service: Gastroenterology;;   TONSILLECTOMY  1953   Social History   Socioeconomic History   Marital status: Married    Spouse name: Peggy   Number of children: 1   Years of education: Not on file   Highest education level: Not on file  Occupational History   Occupation: Midwife    Comment: on STD 10/25/20  Tobacco Use   Smoking status: Never   Smokeless tobacco: Never  Vaping Use   Vaping status: Never Used  Substance and Sexual Activity   Alcohol use: No   Drug use: No    Sexual activity: Not on file  Other Topics Concern   Not on file  Social History Narrative   Lives with wife   Right handed   Drinks 3-4 cups caffeine daily      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    Social Drivers of Health   Financial Resource Strain: Not on file  Food Insecurity: No Food Insecurity (11/08/2021)   Hunger Vital Sign    Worried About Running Out of Food in the Last Year: Never  true    Ran Out of Food in the Last Year: Never true  Transportation Needs: No Transportation Needs (11/08/2021)   PRAPARE - Administrator, Civil Service (Medical): No    Lack of Transportation (Non-Medical): No  Physical Activity: Not on file  Stress: Not on file  Social Connections: Not on file  Intimate Partner Violence: Not on file   Current Outpatient Medications on File Prior to Visit  Medication Sig Dispense Refill   acetaminophen (TYLENOL) 650 MG CR tablet Take 650 mg by mouth every 8 (eight) hours as needed for pain (or headaches).     albuterol (VENTOLIN HFA) 108 (90 Base) MCG/ACT inhaler Inhale into the lungs every 6 (six) hours as needed for wheezing or shortness of breath.     Cholecalciferol (VITAMIN D3) 10000 units capsule Take 10,000 Units by mouth in the morning.     citalopram (CELEXA) 40 MG tablet TAKE 1 TABLET  DAILY FOR MOOD OR CHRONIC ANXIETY                                                       /                                                                   TAKE                                         BY                                                 MOUTH 90 tablet 3   Continuous Glucose Sensor (FREESTYLE LIBRE 3 PLUS SENSOR) MISC 1 each by Does not apply route every 14 (fourteen) days. 6 each 3   fenofibrate 160 MG tablet Take  1 tablet  Daily  for Triglycerides ( Blood Fats)                               /                                                                   TAKE                                         BY  MOUTH 90 tablet 3   fluticasone (FLONASE) 50 MCG/ACT nasal spray Place 1-2 sprays into both nostrils daily as needed for allergies or rhinitis.     glipiZIDE (GLUCOTROL) 5 MG tablet TAKE 1  TABLET BY MOUTH 2 TIMES DAILY WITH MEALS FOR  DIABETES     glucose blood (FREESTYLE LITE) test strip Check blood sugar 1 time daily-[id number] 100 each 5   Insulin Pen Needle 32G X 4 MM MISC Use 1x a day 100 each 3   levETIRAcetam (KEPPRA) 500 MG tablet Take 1 tablet (500 mg total) by mouth 2 (two) times daily. 180 tablet 3   metFORMIN (GLUCOPHAGE) 500 MG tablet TAKE 1 TABLET BY MOUTH 3 TIMES  DAILY (Patient taking differently: Take 500 mg by mouth daily with breakfast.) 270 tablet 3   omeprazole (PRILOSEC) 40 MG capsule Take  1 capdsule  Daily to Prevent Heartburn & Indigestion                                                                               /                                                                   TAKE                                         BY                                                 MOUTH 90 capsule 3   rosuvastatin (CRESTOR) 10 MG tablet TAKE 1 TABLET BY MOUTH DAILY FOR CHOLESTEROL 90 tablet 3   Tetrahydroz-Glyc-Hyprom-PEG (VISINE MAXIMUM REDNESS RELIEF) 0.05-0.2-0.36-1 % SOLN Place 1-2 drops into both eyes 3 (three) times daily as needed (dry/irrtated eyes.).     tirzepatide (MOUNJARO) 5 MG/0.5ML Pen Inject 5 mg into the skin once a week. 6 mL 3   triamcinolone ointment (KENALOG) 0.1 % Apply 1 Application topically 2 (two) times daily. 80 g 1   valsartan (DIOVAN) 160 MG tablet Take 1 tablet daily for BP and Diabetic Kidney Protection 90 tablet 3   No current facility-administered medications on file prior to visit.   Allergies  Allergen Reactions   Cardura [Doxazosin Mesylate] Other (See Comments)    Nasal congestion   Codeine Other (See Comments)    The patient passed out   Hytrin [Terazosin] Other (See Comments)    Reaction not recalled    Family History  Problem Relation Age of Onset   Hypertension Mother    GER disease Mother    Hyperlipidemia Father    Hypertension Father    Parkinson's disease Father    Hypertension Brother    Diabetes Maternal Grandmother  Stroke Paternal Grandmother    Diabetes Paternal Grandmother    Stroke Paternal Grandfather    Colon cancer Neg Hx    Colon polyps Neg Hx    Esophageal cancer Neg Hx    Stomach cancer Neg Hx    Rectal cancer Neg Hx    PE: There were no vitals taken for this visit. Wt Readings from Last 10 Encounters:  11/19/23 222 lb 6.4 oz (100.9 kg)  11/14/23 234 lb (106.1 kg)  10/02/23 231 lb 6.4 oz (105 kg)  09/25/23 229 lb 6.4 oz (104.1 kg)  08/16/23 231 lb 9.6 oz (105.1 kg)  08/13/23 233 lb 12.8 oz (106.1 kg)  06/11/23 224 lb 9.6 oz (101.9 kg)  05/29/23 228 lb (103.4 kg)  04/12/23 224 lb 9.6 oz (101.9 kg)  02/13/23 220 lb (99.8 kg)  . Constitutional: overweight, in NAD, + shuffling gait Eyes:EOMI, no exophthalmos ENT: no thyromegaly, no cervical lymphadenopathy Cardiovascular: RRR, No MRG Respiratory: CTA B Musculoskeletal: no deformities Skin:  no rashes Neurological: + mild tremor with outstretched R hand  ASSESSMENT: 1. DM2, insulin-dependent, uncontrolled, with long-term complications - CKD stage 3 - DR  2.  Hyperlipidemia  3. Obesity class I  PLAN:  1. Patient with longstanding, uncontrolled, type 2 diabetes, on metformin, sulfonylurea, long-acting insulin, to which I recommended to add a GLP-1/GIP receptor agonist.  He was previously on Trulicity but then came off and I recommended to start Mounjaro instead.  At last visit sugars were more variable but lower overall, and with increased variability especially after lunch.  He did have some lows especially in the early morning hours, down to the 60s.  He was extremely confused about his Lantus doses.  He was initially injecting 1 dose a week so I recommended to change it to once a day.   Afterwards, he was injecting 1 dose and throwing away the pen.  This was clarified by the pharmacist and at last visit he was taking it every day but he did not remember how much.  When I showed him the insulin pen, he felt that he was probably to injecting 12 units. - At last visit besides adding Mounjaro I recommended to reduce glipizide to only 1 tablet twice a day and to try to stop Lantus.  We went over the instructions several times.  At that time, HbA1c was lower, at 7.6%. CGM interpretation; -At today's visit, we reviewed his CGM downloads: It appears that *** of values are in target range (goal >70%), while *** are higher than 180 (goal <25%), and *** are lower than 70 (goal <4%).  The calculated average blood sugar is ***.  The projected HbA1c for the next 3 months (GMI) is ***. -Reviewing the CGM trends, ***  - I suggested to:  Patient Instructions  Please continue: - Metformin 500 mg 2x a day - Glipizide 10 mg 2x a day before meals - Mounjaro 5 mg weekly - Lantus 10 units daily.  Please return in 3-4 months.  - we checked his HbA1c: 7%  - advised to check sugars at different times of the day - 4x a day, rotating check times - advised for yearly eye exams >> he is UTD - return to clinic in 3-4 months  2. HL - Latest lipid levels were reviewed from 05/2023: LDL and triglycerides above target, HDL low: Lab Results  Component Value Date   CHOL 156 05/29/2023   HDL 31 (L) 05/29/2023   LDLCALC 95 05/29/2023   TRIG  205 (H) 05/29/2023   CHOLHDL 5.0 (H) 05/29/2023  - Previously on Crestor 10) a fibrate 160 mg daily.  I recommended co-Q10 for muscle aches in the past.  3.  Obesity class I -Ozempic was not covered.  He had to start Bydureon as Ozempic was not covered until he tried this.  However, he was not happy with the Bydureon injections, sugars were higher and he gained more weight and wanted to switch back to Trulicity.  We started back on Trulicity but then I suggested  Mounjaro again at last visit - He gained 2 pounds before last visit. - He lost 9 pounds since last visit  Carlus Pavlov, MD PhD [facility]

## 2023-11-26 NOTE — Telephone Encounter (Signed)
 Called patient. Spoke with Jeffery Reed on dpr. reviewed all information and repeated back to me. Will call if any questions.  Gave Jeffery next OV for pt. No concerns or questions at this time.

## 2023-11-26 NOTE — Telephone Encounter (Signed)
 Copied from CRM [id number]. Topic: General - Other >> Nov 23, 2023  5:35 PM Abigail D wrote: Reason for CRM: Patient returned call from [facility] outside of office hours, would like call back on Monday.

## 2023-11-27 ENCOUNTER — Encounter: Payer: Self-pay | Admitting: Internal Medicine

## 2023-11-27 ENCOUNTER — Ambulatory Visit (INDEPENDENT_AMBULATORY_CARE_PROVIDER_SITE_OTHER): Admitting: Internal Medicine

## 2023-11-27 VITALS — BP 120/60 | HR 75 | Ht 68.5 in | Wt 225.6 lb

## 2023-11-27 DIAGNOSIS — E1122 Type 2 diabetes mellitus with diabetic chronic kidney disease: Secondary | ICD-10-CM | POA: Diagnosis not present

## 2023-11-27 DIAGNOSIS — Z7984 Long term (current) use of oral hypoglycemic drugs: Secondary | ICD-10-CM

## 2023-11-27 DIAGNOSIS — N1832 Chronic kidney disease, stage 3b: Secondary | ICD-10-CM

## 2023-11-27 DIAGNOSIS — N183 Chronic kidney disease, stage 3 unspecified: Secondary | ICD-10-CM

## 2023-11-27 DIAGNOSIS — Z7985 Long-term (current) use of injectable non-insulin antidiabetic drugs: Secondary | ICD-10-CM

## 2023-11-27 LAB — POCT GLYCOSYLATED HEMOGLOBIN (HGB A1C): Hemoglobin A1C: 6.2 % — AB (ref 4.0–5.6)

## 2023-11-27 MED ORDER — TIRZEPATIDE 5 MG/0.5ML ~~LOC~~ SOAJ: 5.0000 mg | SUBCUTANEOUS | 3 refills | Status: DC

## 2023-11-27 MED ORDER — FREESTYLE LIBRE 3 PLUS SENSOR MISC: 1.0000 | 3 refills | Status: DC

## 2023-11-27 MED ORDER — METFORMIN HCL 500 MG PO TABS: 500.0000 mg | ORAL_TABLET | Freq: Two times a day (BID) | ORAL | Status: DC

## 2023-11-27 MED ORDER — GLIPIZIDE 5 MG PO TABS: ORAL_TABLET | ORAL | Status: DC

## 2023-11-27 NOTE — Progress Notes (Signed)
 Patient ID: Jeffery Reed, male   DOB: January 23, 1946, 78 y.o.   MRN: [id number]  This note was precharted 11/26/2023.  HPI: Jeffery Reed is a 78 y.o.-year-old male, initially referred by his PCP, Lucky Cowboy, MD, returning for follow-up for DM2, dx in 2018, insulin-dependent since 12/2019, uncontrolled, with complications (CKD stage III, diabetic retinopathy).  Last visit 2 mo ago.  Interim history: No blurry vision, nausea, chest pain.  Reviewed HbA1c levels: Lab Results  Component Value Date   HGBA1C 7.6 (A) 10/02/2023   HGBA1C 8.3 (A) 08/13/2023   HGBA1C 8.1 (H) 06/11/2023   HGBA1C 8.2 (H) 05/29/2023   HGBA1C 8.0 (H) 02/13/2023   HGBA1C 6.0 (H) 10/18/2022   HGBA1C 5.9 (A) 10/12/2022   HGBA1C 6.3 (H) 07/05/2022   HGBA1C 6.8 (H) 03/29/2022   HGBA1C 6.8 (A) 01/23/2022   He was previously on: - Metformin ER 1000 mg 2x a day, with meals >> ... >> 500 mg 2-3x a day  - Glipizide 05-25-09 >> .Marland Kitchen. 10 mg 2x a day, before meals - Trulicity >> Bydureon y per insurance preference >> Trulicity 4.5 mg weekly >> he did not start Mounjaro as he was not given this from the pharmacy (unclear why) but he did stop Trulicity... - Tresiba 10 >> 8 >> Lantus 8  >> 4-6 units at bedtime >> stopped 04/2020  >> restarted 07/2023, but he actually takes it once a week! Ozempic was not covered in 2024.  At last visit he was on: - Metformin 500 mg 2x a day - Glipizide 10 mg 2x a day before meals - Mounjaro 5 mg weekly -he obtains this from the pharmacy but is not taking it as he had some lower blood sugars, in the 60s - Lantus 10 units in am [increase by 2 units every 3 days until sugars are at goal (<130)] -he tells me that he is taking 0.2 units, which I advised him it is not possible.  I believe he is taking 12 units, but this is unclear...  At last visit I recommended: - Metformin 500 mg 2x a day - Glipizide 5 mg 2x a day before meals - Mounjaro 5 mg weekly - started 09/2023 We stopped Lantus  09/2023.  Pt checks his sugars >4x a day:  Previously:  Previously:   Lowest sugar was 60s >> 65; he has hypoglycemia awareness at 100. Highest sugar was 300s >> 300s.  Glucometer: Freestyle  Pt's meals are: - Breakfast:  Instant oatmeal or Jimmy Dean sausage bisuit or scrambled eggs - Lunch: tomato sandwich, ham - Dinner: larger meal: potatoes; spaghetti, lasagna, chicken, veggies: green beans, corn, broccoli - Snacks:potato chips, apples Diet sodas, unsweet tea,  -+ CKD stage 3b-sees nephrology, last BUN/creatinine:  Lab Results  Component Value Date   BUN 20 11/19/2023   BUN 17 05/29/2023   CREATININE 2.21 (H) 11/19/2023   CREATININE 1.78 (H) 05/29/2023   Lab Results  Component Value Date   MICRALBCREAT 31 (H) 05/29/2023   MICRALBCREAT 15 03/29/2022   MICRALBCREAT 3 03/03/2021   MICRALBCREAT 6 03/02/2020   MICRALBCREAT 4 12/12/2018   MICRALBCREAT 6 10/23/2017   MICRALBCREAT 3 09/21/2016   MICRALBCREAT 3 09/03/2015   MICRALBCREAT 3.8 08/19/2014   MICRALBCREAT 5.6 08/18/2013  On valsartan 320.  -+ HL; last set of lipids: Lab Results  Component Value Date   CHOL 156 05/29/2023   HDL 31 (L) 05/29/2023   LDLCALC 95 05/29/2023   TRIG 205 (H) 05/29/2023   CHOLHDL 5.0 (  H) 05/29/2023  On Crestor 10, fenofibrate 160.  - last eye exam was on 05/2023: No DR; prev.+ DR, + small cataract. [facility].  -+ Mild, occasional numbness and tingling in his feet.  Foot exam performed 05/29/2023.  Pt has no FH of DM.  He also has HTN, OSA. Patient has a history of meningioma surgery in 2021.  He recovered well afterwards.  However, he had a seizure in 2022  >> started Keppra.  He is a retired IT sales professional.  During the winter, he drives a schoolbus.   ROS: + See HPI  I reviewed pt's medications, allergies, PMH, social hx, family hx, and changes were documented in the history of present illness. Otherwise, unchanged from my initial visit note.  Past Medical History:   Diagnosis Date   Allergy    Anxiety    Cataract    beginning stage 01/22/2019   Chronic kidney disease    enlarge bladder   Diabetes mellitus without complication (HCC)    Type 2   GERD (gastroesophageal reflux disease)    Gout    HOH (hard of hearing)    bilateral, no aids   Hyperlipidemia    Hypertension    OSA (obstructive sleep apnea)    uses CPAP   Seizures (HCC)    last 3 years ago in MAY/ benign brain tumor   Sleep apnea    c pap   Type 2 diabetes mellitus with stage 3 chronic kidney disease, without long-term current use of insulin (HCC) 08/17/2013   Urinary frequency    Past Surgical History:  Procedure Laterality Date   COLONOSCOPY  2020   Dr.Jacobs   COLONOSCOPY WITH PROPOFOL N/A 05/04/2022   Procedure: COLONOSCOPY WITH PROPOFOL;  Surgeon: Sherrilyn Rist, MD;  Location: [facility];  Service: Gastroenterology;  Laterality: N/A;   CRANIOTOMY Right 12/29/2019   Procedure: Craniotomy for Tumor;  Surgeon: Barnett Abu, MD;  Location: [facility] OR;  Service: Neurosurgery;  Laterality: Right;  Craniotomy for Tumor Excision   FOOT SURGERY  10/2009   MYRINGOTOMY Left 02/2005   Dr.Newman   MYRINGOTOMY WITH TUBE PLACEMENT Left 10/11/2022   Procedure: LEFT MYRINGOTOMY WITH TUBE PLACEMENT (BUTTERFLY);  Surgeon: Bud Face, MD;  Location: [facility];  Service: ENT;  Laterality: Left;  Diabetic   POLYPECTOMY  05/04/2022   Procedure: POLYPECTOMY;  Surgeon: Sherrilyn Rist, MD;  Location: [facility];  Service: Gastroenterology;;   TONSILLECTOMY  1953   Social History   Socioeconomic History   Marital status: Married    Spouse name: Peggy   Number of children: 1   Years of education: Not on file   Highest education level: Not on file  Occupational History   Occupation: Midwife    Comment: on STD 10/25/20  Tobacco Use   Smoking status: Never   Smokeless tobacco: Never  Vaping Use   Vaping status: Never Used  Substance and Sexual Activity   Alcohol  use: No   Drug use: No   Sexual activity: Not on file  Other Topics Concern   Not on file  Social History Narrative   Lives with wife   Right handed   Drinks 3-4 cups caffeine daily      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    Social Drivers of Corporate investment banker Strain: Not on file  Food Insecurity: No Food Insecurity (11/08/2021)   Hunger Vital Sign    Worried About Running Out of Food  in the Last Year: Never true    Ran Out of Food in the Last Year: Never true  Transportation Needs: No Transportation Needs (11/08/2021)   PRAPARE - Administrator, Civil Service (Medical): No    Lack of Transportation (Non-Medical): No  Physical Activity: Not on file  Stress: Not on file  Social Connections: Not on file  Intimate Partner Violence: Not on file   Current Outpatient Medications on File Prior to Visit  Medication Sig Dispense Refill   acetaminophen (TYLENOL) 650 MG CR tablet Take 650 mg by mouth every 8 (eight) hours as needed for pain (or headaches).     albuterol (VENTOLIN HFA) 108 (90 Base) MCG/ACT inhaler Inhale into the lungs every 6 (six) hours as needed for wheezing or shortness of breath.     Cholecalciferol (VITAMIN D3) 10000 units capsule Take 10,000 Units by mouth in the morning.     citalopram (CELEXA) 40 MG tablet TAKE 1 TABLET  DAILY FOR MOOD OR CHRONIC ANXIETY                                                       /                                                                   TAKE                                         BY                                                 MOUTH 90 tablet 3   Continuous Glucose Sensor (FREESTYLE LIBRE 3 PLUS SENSOR) MISC 1 each by Does not apply route every 14 (fourteen) days. 6 each 3   fenofibrate 160 MG tablet Take  1 tablet  Daily  for Triglycerides ( Blood Fats)                               /                                                                   TAKE                                          BY  MOUTH 90 tablet 3   fluticasone (FLONASE) 50 MCG/ACT nasal spray Place 1-2 sprays into both nostrils daily as needed for allergies or rhinitis.     glipiZIDE (GLUCOTROL) 5 MG tablet TAKE 1  TABLET BY MOUTH 2 TIMES DAILY WITH MEALS FOR  DIABETES     glucose blood (FREESTYLE LITE) test strip Check blood sugar 1 time daily-[id number] 100 each 5   Insulin Pen Needle 32G X 4 MM MISC Use 1x a day 100 each 3   levETIRAcetam (KEPPRA) 500 MG tablet Take 1 tablet (500 mg total) by mouth 2 (two) times daily. 180 tablet 3   metFORMIN (GLUCOPHAGE) 500 MG tablet TAKE 1 TABLET BY MOUTH 3 TIMES  DAILY (Patient taking differently: Take 500 mg by mouth daily with breakfast.) 270 tablet 3   omeprazole (PRILOSEC) 40 MG capsule Take  1 capdsule  Daily to Prevent Heartburn & Indigestion                                                                               /                                                                   TAKE                                         BY                                                 MOUTH 90 capsule 3   rosuvastatin (CRESTOR) 10 MG tablet TAKE 1 TABLET BY MOUTH DAILY FOR CHOLESTEROL 90 tablet 3   Tetrahydroz-Glyc-Hyprom-PEG (VISINE MAXIMUM REDNESS RELIEF) 0.05-0.2-0.36-1 % SOLN Place 1-2 drops into both eyes 3 (three) times daily as needed (dry/irrtated eyes.).     tirzepatide (MOUNJARO) 5 MG/0.5ML Pen Inject 5 mg into the skin once a week. 6 mL 3   triamcinolone ointment (KENALOG) 0.1 % Apply 1 Application topically 2 (two) times daily. 80 g 1   valsartan (DIOVAN) 160 MG tablet Take 1 tablet daily for BP and Diabetic Kidney Protection 90 tablet 3   No current facility-administered medications on file prior to visit.   Allergies  Allergen Reactions   Cardura [Doxazosin Mesylate] Other (See Comments)    Nasal congestion   Codeine Other (See Comments)    The patient passed out   Hytrin [Terazosin] Other (See Comments)     Reaction not recalled   Family History  Problem Relation Age of Onset   Hypertension Mother    GER disease Mother    Hyperlipidemia Father    Hypertension Father    Parkinson's disease Father    Hypertension Brother    Diabetes Maternal Grandmother  Stroke Paternal Grandmother    Diabetes Paternal Grandmother    Stroke Paternal Grandfather    Colon cancer Neg Hx    Colon polyps Neg Hx    Esophageal cancer Neg Hx    Stomach cancer Neg Hx    Rectal cancer Neg Hx    PE: BP 120/60   Pulse 75   Ht 5' 8.5" (1.74 m)   Wt 225 lb 9.6 oz (102.3 kg)   SpO2 96%   BMI 33.80 kg/m  Wt Readings from Last 10 Encounters:  11/27/23 225 lb 9.6 oz (102.3 kg)  11/19/23 222 lb 6.4 oz (100.9 kg)  11/14/23 234 lb (106.1 kg)  10/02/23 231 lb 6.4 oz (105 kg)  09/25/23 229 lb 6.4 oz (104.1 kg)  08/16/23 231 lb 9.6 oz (105.1 kg)  08/13/23 233 lb 12.8 oz (106.1 kg)  06/11/23 224 lb 9.6 oz (101.9 kg)  05/29/23 228 lb (103.4 kg)  04/12/23 224 lb 9.6 oz (101.9 kg)  . Constitutional: overweight, in NAD, + shuffling gait Eyes:EOMI, no exophthalmos ENT: no thyromegaly, no cervical lymphadenopathy Cardiovascular: RRR, No MRG Respiratory: CTA B Musculoskeletal: no deformities Skin:  no rashes Neurological: + mild tremor with outstretched R hand  ASSESSMENT: 1. DM2, insulin-dependent, uncontrolled, with long-term complications - CKD stage 3 - DR  2.  Hyperlipidemia  3. Obesity class I  PLAN:  1. Patient with longstanding, uncontrolled, type 2 diabetes, on metformin, sulfonylurea, long-acting insulin, to which I recommended to add a GLP-1/GIP receptor agonist.  He was previously on Trulicity but then came off and I recommended to start Mounjaro instead.  At last visit sugars were more variable but lower overall, and with increased variability especially after lunch.  He did have some lows especially in the early morning hours, down to the 60s.  He was extremely confused about his Lantus doses.   He was initially injecting 1 dose a week so I recommended to change it to once a day.  Afterwards, he was injecting 1 dose and throwing away the pen.  This was clarified by the pharmacist and at last visit he was taking it every day but he did not remember how much.  When I showed him the insulin pen, he felt that he was probably to injecting 12 units. - At last visit besides adding Mounjaro I recommended to reduce glipizide to only 1 tablet twice a day and to try to stop Lantus.  We went over the instructions several times.  At that time, HbA1c was lower, at 7.6%. CGM interpretation; -At today's visit, we reviewed his CGM downloads: It appears that 87% of values are in target range (goal >70%), while 5% are higher than 180 (goal <25%), and 8% are lower than 70 (goal <4%).  The calculated average blood sugar is 114.  The projected HbA1c for the next 3 months (GMI) is 6.0%. -Reviewing the CGM trends, sugars are significantly improved, with the majority of the values within the target range but 8% of the values being low.  He is not taking Lantus right now and he appears to have hypoglycemia despite reducing the glipizide dose at last visit.  Therefore, at today's visit, we discussed about stopping glipizide before meals and only taking it as needed before a larger meal.  Otherwise, we will continue the lower dose of metformin and also Mounjaro.  He tolerates Mounjaro well. - I suggested to:  Patient Instructions  Please continue: - Metformin 500 mg 2x a day - Mounjaro 5 mg  weekly  Change: - Glipizide to 5 mg before a larger meal  Please return in 3-4 months.  - we checked his HbA1c: 6.2% (lower) - advised to check sugars at different times of the day - 4x a day, rotating check times - advised for yearly eye exams >> he is UTD - return to clinic in 3-4 months  2. HL - Lipid panel was reviewed from 05/2023: LDL and triglycerides above target, HDL low: Lab Results  Component Value Date   CHOL  156 05/29/2023   HDL 31 (L) 05/29/2023   LDLCALC 95 05/29/2023   TRIG 205 (H) 05/29/2023   CHOLHDL 5.0 (H) 05/29/2023  - On Crestor 10 mg daily, fenofibrate 160 mg daily.  I also recommended co-Q10 for muscle aches in the past.  3.  Obesity class I -Ozempic was not covered.  He had to start Bydureon as Ozempic was not covered until he tried this.  However, he was not happy with the Bydureon injections, sugars were higher and he gained more weight and wanted to switch back to Trulicity.  We started back on Trulicity but then I suggested Mounjaro again at last visit - He gained 2 pounds before last visit. - Lost 6 pounds since last visit  Carlus Pavlov, MD PhD [facility] Endocrinology

## 2023-11-27 NOTE — Patient Instructions (Addendum)
 Please continue: - Metformin 500 mg 2x a day - Mounjaro 5 mg weekly  Change: - Glipizide to 5 mg before a larger meal  Please return in 3-4 months.

## 2023-12-20 ENCOUNTER — Ambulatory Visit

## 2023-12-20 VITALS — Ht 68.5 in | Wt 225.0 lb

## 2023-12-20 DIAGNOSIS — Z0000 Encounter for general adult medical examination without abnormal findings: Secondary | ICD-10-CM | POA: Diagnosis not present

## 2023-12-20 NOTE — Patient Instructions (Signed)
 [name] , Thank you for taking time to come for your Medicare Wellness Visit. I appreciate your ongoing commitment to your health goals. Please review the following plan we discussed and let me know if I can assist you in the future.   Referrals/Orders/Follow-Ups/Clinician Recommendations: none  This is a list of the screening recommended for you and due dates:  Health Maintenance  Topic Date Due   Zoster (Shingles) Vaccine (1 of 2) 07/[birth year]/1966   Eye exam for diabetics  03/01/2023   COVID-19 Vaccine (6 - 2024-25 season) 04/22/2023   Flu Shot  03/21/2024   Yearly kidney health urinalysis for diabetes  05/28/2024   Complete foot exam   05/28/2024   Hemoglobin A1C  05/28/2024   Yearly kidney function blood test for diabetes  11/18/2024   Medicare Annual Wellness Visit  12/19/2024   Colon Cancer Screening  05/04/2025   DTaP/Tdap/Td vaccine (3 - Td or Tdap) 05/31/2025   Pneumonia Vaccine  Completed   Hepatitis C Screening  Completed   HPV Vaccine  Aged Out   Meningitis B Vaccine  Aged Out    Advanced directives: (Copy Requested) Please bring a copy of your health care power of attorney and living will to the office to be added to your chart at your convenience. You can mail to [facility] [address] [place], [place] [address] or email to [email or web address]  Next Medicare Annual Wellness Visit scheduled for next year: Yes 12/23/24 @ 3pm televisit  Have you seen your provider in the last 6 months (3 months if uncontrolled diabetes)? Yes

## 2023-12-20 NOTE — Progress Notes (Signed)
 Subjective:   Jeffery Reed is a 78 y.o. who presents for a Medicare Wellness preventive visit.  Visit Complete: Virtual I connected with  Jeffery Reed on 12/20/23 by a audio enabled telemedicine application and verified that I am speaking with the correct person using two identifiers.  Patient Location: Home  Provider Location: Office/Clinic  I discussed the limitations of evaluation and management by telemedicine. The patient expressed understanding and agreed to proceed.  Vital Signs: Because this visit was a virtual/telehealth visit, some criteria may be missing or patient reported. Any vitals not documented were not able to be obtained and vitals that have been documented are patient reported.  VideoDeclined- This patient declined Librarian, academic. Therefore the visit was completed with audio only.  Persons Participating in Visit: Patient.  AWV Questionnaire: No: Patient Medicare AWV questionnaire was not completed prior to this visit.  Cardiac Risk Factors include: advanced age (>90men, >78 women);diabetes mellitus;hypertension;male gender;dyslipidemia;obesity (BMI >30kg/m2);sedentary lifestyle     Objective:    Today's Vitals   12/20/23 1553  Weight: 225 lb (102.1 kg)  Height: 5' 8.5" (1.74 m)   Body mass index is 33.71 kg/m.     12/20/2023    4:04 PM 10/11/2022    9:22 AM 07/05/2022   11:00 AM 05/04/2022    9:19 AM 06/15/2021   11:56 AM 10/15/2020   12:00 AM 10/14/2020    8:32 PM  Advanced Directives  Does Patient Have a Medical Advance Directive? Yes No Yes No Yes Yes No  Type of Estate agent of [place];Living will  Healthcare Power of [place];Living will  Living will;Healthcare Power of [employer] Power of [place];Living will   Does patient want to make changes to medical advance directive?   No - Patient declined  No - Patient declined No - Patient declined   Copy of Healthcare Power of  Attorney in Chart? No - copy requested  No - copy requested  No - copy requested No - copy requested   Would patient like information on creating a medical advance directive?  No - Patient declined  Yes (MAU/Ambulatory/Procedural Areas - Information given)       Current Medications (verified) Outpatient Encounter Medications as of 12/20/2023  Medication Sig   acetaminophen  (TYLENOL ) 650 MG CR tablet Take 650 mg by mouth every 8 (eight) hours as needed for pain (or headaches).   albuterol  (VENTOLIN  HFA) 108 (90 Base) MCG/ACT inhaler Inhale into the lungs every 6 (six) hours as needed for wheezing or shortness of breath.   Cholecalciferol (VITAMIN D3) 10000 units capsule Take 10,000 Units by mouth in the morning.   citalopram  (CELEXA ) 40 MG tablet TAKE 1 TABLET  DAILY FOR MOOD OR CHRONIC ANXIETY                                                       /                                                                   TAKE  BY                                                 MOUTH   Continuous Glucose Sensor (FREESTYLE LIBRE 3 PLUS SENSOR) MISC 1 each by Does not apply route every 14 (fourteen) days.   fenofibrate  160 MG tablet Take  1 tablet  Daily  for Triglycerides ( Blood Fats)                               /                                                                   TAKE                                         BY                                                 MOUTH   fluticasone  (FLONASE ) 50 MCG/ACT nasal spray Place 1-2 sprays into both nostrils daily as needed for allergies or rhinitis.   glipiZIDE  (GLUCOTROL ) 5 MG tablet TAKE 1  TABLET BY MOUTH before a large meal up to 1x a day   glucose blood (FREESTYLE LITE) test strip Check blood sugar 1 time daily-[id number]   Insulin  Pen Needle 32G X 4 MM MISC Use 1x a day   levETIRAcetam  (KEPPRA ) 500 MG tablet Take 1 tablet (500 mg total) by mouth 2 (two) times daily.   metFORMIN  (GLUCOPHAGE ) 500 MG tablet Take 1  tablet (500 mg total) by mouth 2 (two) times daily with a meal.   omeprazole  (PRILOSEC) 40 MG capsule Take  1 capdsule  Daily to Prevent Heartburn & Indigestion                                                                               /                                                                   TAKE                                         BY  MOUTH   rosuvastatin  (CRESTOR ) 10 MG tablet TAKE 1 TABLET BY MOUTH DAILY FOR CHOLESTEROL   Tetrahydroz-Glyc-Hyprom-PEG (VISINE MAXIMUM REDNESS RELIEF) 0.05-0.2-0.36-1 % SOLN Place 1-2 drops into both eyes 3 (three) times daily as needed (dry/irrtated eyes.).   tirzepatide (MOUNJARO) 5 MG/0.5ML Pen Inject 5 mg into the skin once a week.   triamcinolone  ointment (KENALOG ) 0.1 % Apply 1 Application topically 2 (two) times daily.   valsartan  (DIOVAN ) 160 MG tablet Take 1 tablet daily for BP and Diabetic Kidney Protection   No facility-administered encounter medications on file as of 12/20/2023.    Allergies (verified) Cardura [doxazosin mesylate], Codeine, and Hytrin [terazosin]   History: Past Medical History:  Diagnosis Date   Allergy    Anxiety    Cataract    beginning stage 01/22/2019   Chronic kidney disease    enlarge bladder   Diabetes mellitus without complication (HCC)    Type 2   GERD (gastroesophageal reflux disease)    Gout    HOH (hard of hearing)    bilateral, no aids   Hyperlipidemia    Hypertension    OSA (obstructive sleep apnea)    uses CPAP   Seizures (HCC)    last 3 years ago in MAY/ benign brain tumor   Sleep apnea    c pap   Type 2 diabetes mellitus with stage 3 chronic kidney disease, without long-term current use of insulin  (HCC) 08/17/2013   Urinary frequency    Past Surgical History:  Procedure Laterality Date   COLONOSCOPY  2020   Dr.Jacobs   COLONOSCOPY WITH PROPOFOL  N/A 05/04/2022   Procedure: COLONOSCOPY WITH PROPOFOL ;  Surgeon: Albertina Hugger, MD;   Location: [facility];  Service: Gastroenterology;  Laterality: N/A;   CRANIOTOMY Right 12/29/2019   Procedure: Craniotomy for Tumor;  Surgeon: Elna Haggis, MD;  Location: [facility] OR;  Service: Neurosurgery;  Laterality: Right;  Craniotomy for Tumor Excision   FOOT SURGERY  10/2009   MYRINGOTOMY Left 02/2005   Dr.Newman   MYRINGOTOMY WITH TUBE PLACEMENT Left 10/11/2022   Procedure: LEFT MYRINGOTOMY WITH TUBE PLACEMENT (BUTTERFLY);  Surgeon: Rogers Clayman, MD;  [facility];  Service: ENT;  Laterality: Left;  Diabetic   POLYPECTOMY  05/04/2022   Procedure: POLYPECTOMY;  Surgeon: Albertina Hugger, MD;  Location: [facility];  Service: Gastroenterology;;   TONSILLECTOMY  1953   Family History  Problem Relation Age of Onset   Hypertension Mother    GER disease Mother    Hyperlipidemia Father    Hypertension Father    Parkinson's disease Father    Hypertension Brother    Diabetes Maternal Grandmother    Stroke Paternal Grandmother    Diabetes Paternal Grandmother    Stroke Paternal Grandfather    Colon cancer Neg Hx    Colon polyps Neg Hx    Esophageal cancer Neg Hx    Stomach cancer Neg Hx    Rectal cancer Neg Hx    Social History   Socioeconomic History   Marital status: Married    Spouse name: Peggy   Number of children: 1   Years of education: Not on file   Highest education level: Not on file  Occupational History   Occupation: Midwife    Comment: on STD 10/25/20  Tobacco Use   Smoking status: Never   Smokeless tobacco: Never  Vaping Use   Vaping status: Never Used  Substance and Sexual Activity   Alcohol use: No   Drug use:  No   Sexual activity: Not on file  Other Topics Concern   Not on file  Social History Narrative   Lives with wife   Right handed   Drinks 3-4 cups caffeine daily      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    Social Drivers of Corporate investment banker Strain: Low Risk  (12/20/2023)   Overall Financial  Resource Strain (CARDIA)    Difficulty of Paying Living Expenses: Not hard at all  Food Insecurity: No Food Insecurity (12/20/2023)   Hunger Vital Sign    Worried About Running Out of Food in the Last Year: Never true    Ran Out of Food in the Last Year: Never true  Transportation Needs: No Transportation Needs (12/20/2023)   PRAPARE - Administrator, Civil Service (Medical): No    Lack of Transportation (Non-Medical): No  Physical Activity: Inactive (12/20/2023)   Exercise Vital Sign    Days of Exercise per Week: 0 days    Minutes of Exercise per Session: 0 min  Stress: No Stress Concern Present (12/20/2023)   [employer] of Occupational Health - Occupational Stress Questionnaire    Feeling of Stress : Not at all  Social Connections: Moderately Isolated (12/20/2023)   Social Connection and Isolation Panel [NHANES]    Frequency of Communication with Friends and Family: More than three times a week    Frequency of Social Gatherings with Friends and Family: Once a week    Attends Religious Services: Never    Database administrator or Organizations: No    Attends Engineer, structural: Never    Marital Status: Married    Tobacco Counseling Counseling given: Not Answered    Clinical Intake:  Pre-visit preparation completed: Yes  Pain : No/denies pain     BMI - recorded: 33.71 Nutritional Status: BMI > 30  Obese Nutritional Risks: None Diabetes: Yes CBG done?: Yes (BS 125 this am) CBG resulted in Enter/ Edit results?: No Did pt. bring in CBG monitor from home?: No  Lab Results  Component Value Date   HGBA1C 6.2 (A) 11/27/2023   HGBA1C 7.6 (A) 10/02/2023   HGBA1C 8.3 (A) 08/13/2023     How often do you need to have someone help you when you read instructions, pamphlets, or other written materials from your doctor or pharmacy?: 1 - Never  Interpreter Needed?: No  Comments: lives with wife Information entered by :: B.Amera Banos,LPN   Activities of  Daily Living     12/20/2023    4:04 PM 06/02/2023   10:25 PM  In your present state of health, do you have any difficulty performing the following activities:  Hearing? 0 0  Vision? 0 0  Difficulty concentrating or making decisions? 0 0  Walking or climbing stairs? 0 0  Dressing or bathing? 0 0  Doing errands, shopping? 0 0  Preparing Food and eating ? N   Using the Toilet? N   In the past six months, have you accidently leaked urine? Y   Do you have problems with loss of bowel control? N   Managing your Medications? N   Managing your Finances? N   Housekeeping or managing your Housekeeping? N     Patient Care Team: Dorothe Gaster, NP as PCP - General (Nurse Practitioner) Trent Frizzle, MD as Consulting Physician (Urology) Verlena Glenn, MD as Consulting Physician (Nephrology) Alonza Arthurs, [facility] (Inactive) as Pharmacist (Pharmacist) Johny Nap, NP as Nurse Practitioner (  Neurology) Lisabeth Rider, MD as Referring Physician (Neurology)  Indicate any recent Medical Services you may have received from other than [facility] providers in the past year (date may be approximate).     Assessment:   This is a routine wellness examination for Ladainian.  Hearing/Vision screen Hearing Screening - Comments:: Pt says his hearing is good in left ear;rt ear a little less Vision Screening - Comments:: Pt says his vision is good only readers Dr Particia Bolus   Goals Addressed               This Visit's Progress     Exercise 150 min/wk Moderate Activity   Not on track     Weight (lb) < 200 lb (90.7 kg) (pt-stated)   225 lb (102.1 kg)     12/20/23       Depression Screen     12/20/2023    4:00 PM 06/02/2023   10:24 PM 02/13/2023    1:01 PM 10/18/2022    9:19 PM 07/05/2022   11:00 AM 06/15/2021   12:04 PM 03/06/2021   10:01 PM  PHQ 2/9 Scores  PHQ - 2 Score 0 0 1 0 1 0 0  PHQ- 9 Score   1        Fall Risk     12/20/2023    3:57 PM 11/19/2023    3:10 PM 06/02/2023   10:24 PM  02/13/2023    1:01 PM 10/18/2022    9:19 PM  Fall Risk   Falls in the past year? 0 0 0 0 0  Number falls in past yr: 0 0     Injury with Fall? 0 0     Risk for fall due to : No Fall Risks No Fall Risks No Fall Risks  No Fall Risks  Follow up Education provided;Falls prevention discussed Falls evaluation completed Education provided;Falls prevention discussed;Falls evaluation completed  Falls evaluation completed;Education provided;Falls prevention discussed    MEDICARE RISK AT HOME:  Medicare Risk at Home Any stairs in or around the home?: Yes If so, are there any without handrails?: Yes Home free of loose throw rugs in walkways, pet beds, electrical cords, etc?: Yes Adequate lighting in your home to reduce risk of falls?: Yes Life alert?: No Use of a cane, walker or w/c?: No Grab bars in the bathroom?: Yes Shower chair or bench in shower?: Yes Elevated toilet seat or a handicapped toilet?: Yes  TIMED UP AND GO:  Was the test performed?  No  Cognitive Function: 6CIT completed    06/07/2020   10:33 AM  MMSE - Mini Mental State Exam  Orientation to time 5  Orientation to Place 5  Registration 3  Attention/ Calculation 5  Recall 3  Language- name 2 objects 2  Language- repeat 1  Language- follow 3 step command 3  Language- read & follow direction 1  Write a sentence 1  Copy design 1  Total score 30        12/20/2023    4:05 PM  6CIT Screen  What Year? 0 points  What month? 0 points  What time? 0 points  Count back from 20 0 points  Months in reverse 0 points  Repeat phrase 0 points  Total Score 0 points    Immunizations Immunization History  Administered Date(s) Administered   DT (Pediatric) 06/01/2015   Fluad Quad(high Dose 65+) 05/20/2020   Influenza Whole 06/05/2013   Influenza, High Dose Seasonal PF 06/05/2014, 06/01/2015, 06/14/2016, 06/22/2017, 06/11/2018,  05/13/2019, 05/17/2021, 07/05/2022, 05/29/2023   PFIZER(Purple Top)SARS-COV-2 Vaccination  09/27/2019, 10/20/2019   Pneumococcal Conjugate-13 08/19/2014   Pneumococcal Polysaccharide-23 08/22/2003, 12/10/2015   Tdap 03/08/2004   Zoster, Live 08/18/2013    Screening Tests Health Maintenance  Topic Date Due   Zoster Vaccines- Shingrix (1 of 2) 07[birth year]/1966   OPHTHALMOLOGY EXAM  03/01/2023   COVID-19 Vaccine (6 - 2024-25 season) 04/22/2023   INFLUENZA VACCINE  03/21/2024   Diabetic kidney evaluation - Urine ACR  05/28/2024   FOOT EXAM  05/28/2024   HEMOGLOBIN A1C  05/28/2024   Diabetic kidney evaluation - eGFR measurement  11/18/2024   Medicare Annual Wellness (AWV)  12/19/2024   Colonoscopy  05/04/2025   DTaP/Tdap/Td (3 - Td or Tdap) 05/31/2025   Pneumonia Vaccine 57+ Years old  Completed   Hepatitis C Screening  Completed   HPV VACCINES  Aged Out   Meningococcal B Vaccine  Aged Out    Health Maintenance  Health Maintenance Due  Topic Date Due   Zoster Vaccines- Shingrix (1 of 2) 07[birth year]/1966   OPHTHALMOLOGY EXAM  03/01/2023   COVID-19 Vaccine (6 - 2024-25 season) 04/22/2023   Health Maintenance Items Addressed: None needed  Additional Screening:  Vision Screening: Recommended annual ophthalmology exams for early detection of glaucoma and other disorders of the eye.  Dental Screening: Recommended annual dental exams for proper oral hygiene  Community Resource Referral / Chronic Care Management: CRR required this visit?  No   CCM required this visit?  No    Plan:     I have personally reviewed and noted the following in the patient's chart:   Medical and social history Use of alcohol, tobacco or illicit drugs  Current medications and supplements including opioid prescriptions. Patient is not currently taking opioid prescriptions. Functional ability and status Nutritional status Physical activity Advanced directives List of other physicians Hospitalizations, surgeries, and ER visits in previous 12 months Vitals Screenings to include cognitive,  depression, and falls Referrals and appointments  In addition, I have reviewed and discussed with patient certain preventive protocols, quality metrics, and best practice recommendations. A written personalized care plan for preventive services as well as general preventive health recommendations were provided to patient.    Nerissa Bannister, LPN   03/22/9561   After Visit Summary: (MyChart) Due to this being a telephonic visit, the after visit summary with patients personalized plan was offered to patient via MyChart   Notes: Nothing significant to report at this time.

## 2024-01-25 DIAGNOSIS — H6123 Impacted cerumen, bilateral: Secondary | ICD-10-CM | POA: Diagnosis not present

## 2024-01-25 DIAGNOSIS — H90A32 Mixed conductive and sensorineural hearing loss, unilateral, left ear with restricted hearing on the contralateral side: Secondary | ICD-10-CM | POA: Diagnosis not present

## 2024-01-25 DIAGNOSIS — H6982 Other specified disorders of Eustachian tube, left ear: Secondary | ICD-10-CM | POA: Diagnosis not present

## 2024-02-12 ENCOUNTER — Other Ambulatory Visit: Payer: Self-pay

## 2024-02-12 ENCOUNTER — Other Ambulatory Visit: Payer: Self-pay | Admitting: Internal Medicine

## 2024-02-12 ENCOUNTER — Telehealth: Payer: Self-pay

## 2024-02-12 MED ORDER — FREESTYLE LIBRE 3 PLUS SENSOR MISC: 3 refills | Status: DC

## 2024-02-12 MED ORDER — TIRZEPATIDE 5 MG/0.5ML ~~LOC~~ SOAJ: 5.0000 mg | SUBCUTANEOUS | 3 refills | Status: DC

## 2024-02-12 NOTE — Telephone Encounter (Signed)
 Pt needs PA for [facility]

## 2024-02-13 ENCOUNTER — Telehealth: Payer: Self-pay

## 2024-02-13 ENCOUNTER — Ambulatory Visit: Payer: BLUE CROSS/BLUE SHIELD | Admitting: Nurse Practitioner

## 2024-02-13 ENCOUNTER — Other Ambulatory Visit (HOSPITAL_COMMUNITY): Payer: Self-pay

## 2024-02-13 NOTE — Telephone Encounter (Signed)
 Pharmacy Patient Advocate Encounter   Received notification from Pt Calls Messages that prior authorization for Freestyle libre 3 plus is required/requested.   Insurance verification completed.   The patient is insured through [facility] .   Per test claim: Medication is not eligible for pharmacy benefits and must be billed through medical insurance. As our team only handles pharmacy related prior auths, medical PA's must be submitted by the clinic. Thank you

## 2024-02-14 ENCOUNTER — Telehealth: Payer: Self-pay

## 2024-02-14 NOTE — Telephone Encounter (Signed)
 [facility] sent over a fax requesting this patients chart notes.  Since I can not currently print locally I have faxed it electronically to [facility] from [facility].   Rolin KRAFT

## 2024-02-19 ENCOUNTER — Other Ambulatory Visit: Payer: Self-pay | Admitting: Internal Medicine

## 2024-03-04 ENCOUNTER — Other Ambulatory Visit: Payer: Self-pay | Admitting: Nurse Practitioner

## 2024-03-04 DIAGNOSIS — F419 Anxiety disorder, unspecified: Secondary | ICD-10-CM

## 2024-03-28 ENCOUNTER — Ambulatory Visit: Admitting: Internal Medicine

## 2024-03-28 ENCOUNTER — Encounter: Payer: Self-pay | Admitting: Internal Medicine

## 2024-03-28 VITALS — BP 120/70 | HR 72 | Ht 68.5 in | Wt 211.6 lb

## 2024-03-28 DIAGNOSIS — E7849 Other hyperlipidemia: Secondary | ICD-10-CM

## 2024-03-28 DIAGNOSIS — Z7984 Long term (current) use of oral hypoglycemic drugs: Secondary | ICD-10-CM | POA: Diagnosis not present

## 2024-03-28 DIAGNOSIS — E1122 Type 2 diabetes mellitus with diabetic chronic kidney disease: Secondary | ICD-10-CM

## 2024-03-28 DIAGNOSIS — E66811 Obesity, class 1: Secondary | ICD-10-CM | POA: Diagnosis not present

## 2024-03-28 DIAGNOSIS — Z7985 Long-term (current) use of injectable non-insulin antidiabetic drugs: Secondary | ICD-10-CM | POA: Diagnosis not present

## 2024-03-28 DIAGNOSIS — N1832 Chronic kidney disease, stage 3b: Secondary | ICD-10-CM | POA: Diagnosis not present

## 2024-03-28 LAB — POCT GLYCOSYLATED HEMOGLOBIN (HGB A1C): Hemoglobin A1C: 6.4 % — AB (ref 4.0–5.6)

## 2024-03-28 NOTE — Addendum Note (Signed)
 Addended by: CLEOTILDE ROLIN RAMAN on: 03/28/2024 03:14 PM   Modules accepted: Orders

## 2024-03-28 NOTE — Patient Instructions (Addendum)
 Please continue: - Metformin  500 mg 2x a day - Mounjaro  5 mg weekly  Stop: - Glipizide     Please return in 3-4 months.

## 2024-03-28 NOTE — Progress Notes (Signed)
 Patient ID: Jeffery Reed, male   DOB: 06/03/1946, 78 y.o.   MRN: [id number]   HPI: Jeffery Reed is a 78 y.o.-year-old male, initially referred by his PCP, Wendee Lynwood HERO, NP, returning for follow-up for DM2, dx in 2018, insulin -dependent since 12/2019, uncontrolled, with complications (CKD stage III, diabetic retinopathy).  Last visit 4 mo ago.  Interim history: No blurry vision, nausea, chest pain.  He is active working in his garden.  Reviewed HbA1c levels: Lab Results  Component Value Date   HGBA1C 6.2 (A) 11/27/2023   HGBA1C 7.6 (A) 10/02/2023   HGBA1C 8.3 (A) 08/13/2023   HGBA1C 8.1 (H) 06/11/2023   HGBA1C 8.2 (H) 05/29/2023   HGBA1C 8.0 (H) 02/13/2023   HGBA1C 6.0 (H) 10/18/2022   HGBA1C 5.9 (A) 10/12/2022   HGBA1C 6.3 (H) 07/05/2022   HGBA1C 6.8 (H) 03/29/2022   He was previously on: - Metformin  ER 1000 mg 2x a day, with meals >> ... >> 500 mg 2-3x a day  - Glipizide  05-25-09 >> .SABRA. 10 mg 2x a day, before meals - Trulicity  >> Bydureon  y per insurance preference >> Trulicity  4.5 mg weekly >> he did not start Mounjaro  as he was not given this from the pharmacy (unclear why) but he did stop Trulicity ... - Tresiba  10 >> 8 >> Lantus  8  >> 4-6 units at bedtime >> stopped 04/2020  >> restarted 07/2023, but he actually takes it once a week! Ozempic  was not covered in 2024.  Then on: - Metformin  500 mg 2x a day - Glipizide  10 mg 2x a day before meals - Mounjaro  5 mg weekly -he obtains this from the pharmacy but is not taking it as he had some lower blood sugars, in the 60s - Lantus  10 units in am [increase by 2 units every 3 days until sugars are at goal (<130)] -he tells me that he is taking 0.2 units, which I advised him it is not possible.  I believe he is taking 12 units, but this is unclear...  At last visit I recommended: - Metformin  500 mg 2x a day - Glipizide  5 mg 2x a day before meals >> only before a larger meal - Mounjaro  5 mg weekly - started 09/2023 We stopped  Lantus  09/2023.  Pt checks his sugars >4x a day:  Previously:  Previously:   Lowest sugar was 60s >> 65 >> 55; he has hypoglycemia awareness at 100. Highest sugar was 300s >> 300s.  Glucometer: Freestyle  Pt's meals are: - Breakfast:  Instant oatmeal or [employer] sausage bisuit or scrambled eggs - Lunch: tomato sandwich, ham - Dinner: larger meal: potatoes; spaghetti, lasagna, chicken, veggies: green beans, corn, broccoli - Snacks:potato chips, apples Diet sodas, unsweet tea,  -+ CKD stage 3b-sees nephrology, last BUN/creatinine:  Lab Results  Component Value Date   BUN 20 11/19/2023   BUN 17 05/29/2023   CREATININE 2.21 (H) 11/19/2023   CREATININE 1.78 (H) 05/29/2023   Lab Results  Component Value Date   MICRALBCREAT 31 (H) 05/29/2023   MICRALBCREAT 15 03/29/2022   MICRALBCREAT 3 03/03/2021   MICRALBCREAT 6 03/02/2020   MICRALBCREAT 4 12/12/2018   MICRALBCREAT 6 10/23/2017   MICRALBCREAT 3 09/21/2016   MICRALBCREAT 3 09/03/2015   MICRALBCREAT 3.8 08/19/2014   MICRALBCREAT 5.6 08/18/2013  On valsartan  320.  -+ HL; last set of lipids: Lab Results  Component Value Date   CHOL 156 05/29/2023   HDL 31 (L) 05/29/2023   LDLCALC 95 05/29/2023  TRIG 205 (H) 05/29/2023   CHOLHDL 5.0 (H) 05/29/2023  On Crestor  10, fenofibrate  160.  - last eye exam was on 05/2023: No DR; prev.+ DR, + small cataract. [facility].  -+ Mild, occasional numbness and tingling in his feet.  Foot exam performed 05/29/2023.  Pt has no FH of DM.  He also has HTN, OSA. Patient has a history of meningioma surgery in 2021.  He recovered well afterwards.  However, he had a seizure in 2022  >> started Keppra .  He is a retired IT sales professional.  During the winter, he drives a schoolbus.   ROS: + See HPI  I reviewed pt's medications, allergies, PMH, social hx, family hx, and changes were documented in the history of present illness. Otherwise, unchanged from my initial visit note.  Past  Medical History:  Diagnosis Date   Allergy    Anxiety    Cataract    beginning stage 01/22/2019   Chronic kidney disease    enlarge bladder   Diabetes mellitus without complication (HCC)    Type 2   GERD (gastroesophageal reflux disease)    Gout    HOH (hard of hearing)    bilateral, no aids   Hyperlipidemia    Hypertension    OSA (obstructive sleep apnea)    uses CPAP   Seizures (HCC)    last 3 years ago in MAY/ benign brain tumor   Sleep apnea    c pap   Type 2 diabetes mellitus with stage 3 chronic kidney disease, without long-term current use of insulin  (HCC) 08/17/2013   Urinary frequency    Past Surgical History:  Procedure Laterality Date   COLONOSCOPY  2020   Dr.Jacobs   COLONOSCOPY WITH PROPOFOL  N/A 05/04/2022   Procedure: COLONOSCOPY WITH PROPOFOL ;  Surgeon: Legrand Victory LITTIE DOUGLAS, MD;  Location: [facility];  Service: Gastroenterology;  Laterality: N/A;   CRANIOTOMY Right 12/29/2019   Procedure: Craniotomy for Tumor;  Surgeon: Colon Victory, MD;  Location: [facility];  Service: Neurosurgery;  Laterality: Right;  Craniotomy for Tumor Excision   FOOT SURGERY  10/2009   MYRINGOTOMY Left 02/2005   Dr.Newman   MYRINGOTOMY WITH TUBE PLACEMENT Left 10/11/2022   Procedure: LEFT MYRINGOTOMY WITH TUBE PLACEMENT (BUTTERFLY);  Surgeon: Milissa Hamming, MD;  [facility];  Service: ENT;  Laterality: Left;  Diabetic   POLYPECTOMY  05/04/2022   Procedure: POLYPECTOMY;  Surgeon: Legrand Victory LITTIE DOUGLAS, MD;  Location: [facility];  Service: Gastroenterology;;   TONSILLECTOMY  1953   Social History   Socioeconomic History   Marital status: Married    Spouse name: Peggy   Number of children: 1   Years of education: Not on file   Highest education level: Not on file  Occupational History   Occupation: Midwife    Comment: on STD 10/25/20  Tobacco Use   Smoking status: Never   Smokeless tobacco: Never  Vaping Use   Vaping status: Never Used  Substance and Sexual  Activity   Alcohol use: No   Drug use: No   Sexual activity: Not on file  Other Topics Concern   Not on file  Social History Narrative   Lives with wife   Right handed   Drinks 3-4 cups caffeine daily      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    Social Drivers of Health   Financial Resource Strain: Low Risk  (12/20/2023)   Overall Financial Resource Strain (CARDIA)    Difficulty of  Paying Living Expenses: Not hard at all  Food Insecurity: No Food Insecurity (12/20/2023)   Hunger Vital Sign    Worried About Running Out of Food in the Last Year: Never true    Ran Out of Food in the Last Year: Never true  Transportation Needs: No Transportation Needs (12/20/2023)   PRAPARE - Administrator, Civil Service (Medical): No    Lack of Transportation (Non-Medical): No  Physical Activity: Inactive (12/20/2023)   Exercise Vital Sign    Days of Exercise per Week: 0 days    Minutes of Exercise per Session: 0 min  Stress: No Stress Concern Present (12/20/2023)   [employer] of Occupational Health - Occupational Stress Questionnaire    Feeling of Stress : Not at all  Social Connections: Moderately Isolated (12/20/2023)   Social Connection and Isolation Panel    Frequency of Communication with Friends and Family: More than three times a week    Frequency of Social Gatherings with Friends and Family: Once a week    Attends Religious Services: Never    Database administrator or Organizations: No    Attends Banker Meetings: Never    Marital Status: Married  Catering manager Violence: Not At Risk (12/20/2023)   Humiliation, Afraid, Rape, and Kick questionnaire    Fear of Current or Ex-Partner: No    Emotionally Abused: No    Physically Abused: No    Sexually Abused: No   Current Outpatient Medications on File Prior to Visit  Medication Sig Dispense Refill   acetaminophen  (TYLENOL ) 650 MG CR tablet Take 650 mg by mouth every 8 (eight) hours as needed for pain  (or headaches).     albuterol  (VENTOLIN  HFA) 108 (90 Base) MCG/ACT inhaler Inhale into the lungs every 6 (six) hours as needed for wheezing or shortness of breath.     Cholecalciferol (VITAMIN D3) 10000 units capsule Take 10,000 Units by mouth in the morning.     citalopram  (CELEXA ) 40 MG tablet TAKE 1 TABLET BY MOUTH DAILY FOR MOOD OR CHRONIC ANXIETY 90 tablet 3   Continuous Glucose Sensor (FREESTYLE LIBRE 3 PLUS SENSOR) MISC USE TO MONITOR GLUCOSE CONTINUOUSLY. CHANGE SENSOR EVERY 15 DAYS. 6 each 3   fenofibrate  160 MG tablet Take  1 tablet  Daily  for Triglycerides ( Blood Fats)                               /                                                                   TAKE                                         BY                                                 MOUTH 90 tablet 3   fluticasone  (FLONASE ) 50 MCG/ACT nasal spray  Place 1-2 sprays into both nostrils daily as needed for allergies or rhinitis.     glipiZIDE  (GLUCOTROL ) 5 MG tablet TAKE 1  TABLET BY MOUTH before a large meal up to 1x a day     glucose blood (FREESTYLE LITE) test strip Check blood sugar 1 time daily-[id number] 100 each 5   Insulin  Pen Needle 32G X 4 MM MISC Use 1x a day 100 each 3   levETIRAcetam  (KEPPRA ) 500 MG tablet Take 1 tablet (500 mg total) by mouth 2 (two) times daily. 180 tablet 3   metFORMIN  (GLUCOPHAGE ) 500 MG tablet Take 1 tablet (500 mg total) by mouth 2 (two) times daily with a meal.     omeprazole  (PRILOSEC) 40 MG capsule Take  1 capdsule  Daily to Prevent Heartburn & Indigestion                                                                               /                                                                   TAKE                                         BY                                                 MOUTH 90 capsule 3   rosuvastatin  (CRESTOR ) 10 MG tablet TAKE 1 TABLET BY MOUTH DAILY FOR CHOLESTEROL 90 tablet 3   Tetrahydroz-Glyc-Hyprom-PEG (VISINE MAXIMUM REDNESS RELIEF)  0.05-0.2-0.36-1 % SOLN Place 1-2 drops into both eyes 3 (three) times daily as needed (dry/irrtated eyes.).     tirzepatide  (MOUNJARO ) 5 MG/0.5ML Pen Inject 5 mg into the skin once a week. 6 mL 3   triamcinolone  ointment (KENALOG ) 0.1 % Apply 1 Application topically 2 (two) times daily. 80 g 1   valsartan  (DIOVAN ) 160 MG tablet Take 1 tablet daily for BP and Diabetic Kidney Protection 90 tablet 3   No current facility-administered medications on file prior to visit.   Allergies  Allergen Reactions   Cardura [Doxazosin Mesylate] Other (See Comments)    Nasal congestion   Codeine Other (See Comments)    The patient passed out   Hytrin [Terazosin] Other (See Comments)    Reaction not recalled   Family History  Problem Relation Age of Onset   Hypertension Mother    GER disease Mother    Hyperlipidemia Father    Hypertension Father    Parkinson's disease Father    Hypertension Brother    Diabetes Maternal Grandmother    Stroke Paternal Grandmother    Diabetes Paternal Grandmother    Stroke Paternal Grandfather  Colon cancer Neg Hx    Colon polyps Neg Hx    Esophageal cancer Neg Hx    Stomach cancer Neg Hx    Rectal cancer Neg Hx    PE: BP 120/70   Pulse 72   Ht 5' 8.5 (1.74 m)   Wt 211 lb 9.6 oz (96 kg)   SpO2 96%   BMI 31.71 kg/m  Wt Readings from Last 10 Encounters:  03/28/24 211 lb 9.6 oz (96 kg)  12/20/23 225 lb (102.1 kg)  11/27/23 225 lb 9.6 oz (102.3 kg)  11/19/23 222 lb 6.4 oz (100.9 kg)  11/14/23 234 lb (106.1 kg)  10/02/23 231 lb 6.4 oz (105 kg)  09/25/23 229 lb 6.4 oz (104.1 kg)  08/16/23 231 lb 9.6 oz (105.1 kg)  08/13/23 233 lb 12.8 oz (106.1 kg)  06/11/23 224 lb 9.6 oz (101.9 kg)  . Constitutional: overweight, in NAD, + shuffling gait Eyes:EOMI, no exophthalmos ENT: no thyromegaly, no cervical lymphadenopathy Cardiovascular: RRR, No MRG Respiratory: CTA B Musculoskeletal: no deformities Skin:  no rashes Neurological: + mild tremor with  outstretched R hand Diabetic Foot Exam - Simple   Simple Foot Form Diabetic Foot exam was performed with the following findings: Yes 03/28/2024  2:02 PM  Visual Inspection No deformities, no ulcerations, no other skin breakdown bilaterally: Yes Sensation Testing Intact to touch and monofilament testing bilaterally: Yes Pulse Check Posterior Tibialis and Dorsalis pulse intact bilaterally: Yes Comments + B onychodystrophy in B hallux    ASSESSMENT: 1. DM2, insulin -dependent, uncontrolled, with long-term complications - CKD stage 3 - DR  2.  Hyperlipidemia  3. Obesity class I  PLAN:  1. Patient with longstanding, uncontrolled, type 2 diabetes, on metformin , sulfonylurea, and weekly GLP-1/GIP receptor agonist, changed from Trulicity  to Mounjaro .  His HbA1c was much better at last visit, at 6.2% and sugars also appear to be significantly improved, with the majority of the values within the target range but 8% of the values being low.  He was off the long-acting insulin  so the only medication that was able to drop his blood sugars was his sulfonylurea.  I advised him to only take this with larger meals, not on a daily basis. CGM interpretation; -At today's visit, we reviewed his CGM downloads: It appears that 77% of values are in target range (goal >70%), while 70% are higher than 180 (goal <25%), and 6% are lower than 70 (goal <4%).  The calculated average blood sugar is 139.  The projected HbA1c for the next 3 months (GMI) is 6.6%. -Reviewing the CGM trends, sugars appear to be mainly fluctuating within the target range but he has many lows, and also some hyperglycemic spikes particularly after these lows.  Upon questioning, he is taking glipizide  for larger meals, but he is taking this after the meals.  This is most likely the reason for the low blood sugars.  At today's visit, we discussed about stopping glipizide  and continuing only with Mounjaro  and metformin .  He agrees with the plan. - I  suggested to:  Patient Instructions  Please continue: - Metformin  500 mg 2x a day - Mounjaro  5 mg weekly  Stop: - Glipizide     Please return in 3-4 months.   - we checked his HbA1c: 6.4% (slightly higher) - advised to check sugars at different times of the day - 4x a day, rotating check times - advised for yearly eye exams >> he is UTD - return to clinic in 3-4 months  2. HL -  He lipid panel was reviewed from 05/2023: All fractions abnormal: Lab Results  Component Value Date   CHOL 156 05/29/2023   HDL 31 (L) 05/29/2023   LDLCALC 95 05/29/2023   TRIG 205 (H) 05/29/2023   CHOLHDL 5.0 (H) 05/29/2023  - He is on Crestor  10 mg daily, fenofibrate  160 mg daily.  I also recommended co-Q10 for muscle aches in the past.  3.  Obesity class I -Ozempic  was not covered.  He had to start Bydureon  as Ozempic  was not covered until he tried this.  However, he was not happy with the Bydureon  injections, sugars were higher and he gained more weight and wanted to switch back to Trulicity .  We started back on Trulicity  but then I suggested Mounjaro  again due to a more powerful effect on both blood sugars and weight. - He lost 6 pounds before last visit but lost 14 pounds since then  Lela Fendt, MD PhD [facility]

## 2024-06-04 ENCOUNTER — Encounter: Admitting: Nurse Practitioner

## 2024-06-04 NOTE — Progress Notes (Deleted)
   Established Patient Office Visit  Subjective   Patient ID: LARK RUNK, male    DOB: 13-Dec-1945  Age: 78 y.o. MRN: [id number]  No chief complaint on file.   HPI  DM2: Patient currently followed by endocrinology maintained on Mounjaro  5 mg weekly, metformin  500 mg twice daily  OSA: On CPAP and currently followed with neurology.  Mood: Currently maintained on Celexa  40 mg daily  HLD: Currently maintained on rosuvastatin  10 mg daily  HTN: Maintained on valsartan  160 mg daily  GERD: Maintained on omeprazole  40 mg daily  Seizures: On Keppra  500 mg twice daily and followed by neurology  for complete physical and follow up of chronic conditions.  Immunizations: -Tetanus: Completed in unsure -Influenza:  -Shingles: zosta vax live -Pneumonia: Completed, need prevnar 20  Diet: Fair diet.  Exercise: No regular exercise.  Eye exam: Completes annually  Dental exam: Completes semi-annually    Colonoscopy: Completed in 05/04/2022, no recall   Lung Cancer Screening: NA   PSA: Due  Sleep:  Advanced directive: Patient states he has an advanced directive     {History (Optional):23778}  ROS    Objective:     There were no vitals taken for this visit. {Vitals History (Optional):23777}  Physical Exam   No results found for any visits on 06/04/24.  {Labs (Optional):23779}  The 10-year ASCVD risk score (Arnett DK, et al., 2019) is: 48.6%    Assessment & Plan:   Problem List Items Addressed This Visit   None   No follow-ups on file.    Adina Crandall, NP

## 2024-06-09 ENCOUNTER — Ambulatory Visit (INDEPENDENT_AMBULATORY_CARE_PROVIDER_SITE_OTHER): Admitting: Nurse Practitioner

## 2024-06-09 ENCOUNTER — Encounter: Payer: Self-pay | Admitting: Nurse Practitioner

## 2024-06-09 VITALS — BP 122/78 | HR 88 | Temp 98.0°F | Ht 69.0 in | Wt 206.0 lb

## 2024-06-09 DIAGNOSIS — N401 Enlarged prostate with lower urinary tract symptoms: Secondary | ICD-10-CM

## 2024-06-09 DIAGNOSIS — Z7984 Long term (current) use of oral hypoglycemic drugs: Secondary | ICD-10-CM

## 2024-06-09 DIAGNOSIS — E559 Vitamin D deficiency, unspecified: Secondary | ICD-10-CM | POA: Diagnosis not present

## 2024-06-09 DIAGNOSIS — E1122 Type 2 diabetes mellitus with diabetic chronic kidney disease: Secondary | ICD-10-CM | POA: Diagnosis not present

## 2024-06-09 DIAGNOSIS — Z7985 Long-term (current) use of injectable non-insulin antidiabetic drugs: Secondary | ICD-10-CM

## 2024-06-09 DIAGNOSIS — N183 Chronic kidney disease, stage 3 unspecified: Secondary | ICD-10-CM

## 2024-06-09 DIAGNOSIS — F419 Anxiety disorder, unspecified: Secondary | ICD-10-CM

## 2024-06-09 DIAGNOSIS — N138 Other obstructive and reflux uropathy: Secondary | ICD-10-CM | POA: Diagnosis not present

## 2024-06-09 DIAGNOSIS — Z125 Encounter for screening for malignant neoplasm of prostate: Secondary | ICD-10-CM

## 2024-06-09 DIAGNOSIS — R569 Unspecified convulsions: Secondary | ICD-10-CM | POA: Diagnosis not present

## 2024-06-09 DIAGNOSIS — E785 Hyperlipidemia, unspecified: Secondary | ICD-10-CM

## 2024-06-09 DIAGNOSIS — Z8603 Personal history of neoplasm of uncertain behavior: Secondary | ICD-10-CM

## 2024-06-09 DIAGNOSIS — E1169 Type 2 diabetes mellitus with other specified complication: Secondary | ICD-10-CM

## 2024-06-09 DIAGNOSIS — G4733 Obstructive sleep apnea (adult) (pediatric): Secondary | ICD-10-CM

## 2024-06-09 DIAGNOSIS — K2100 Gastro-esophageal reflux disease with esophagitis, without bleeding: Secondary | ICD-10-CM

## 2024-06-09 DIAGNOSIS — I10 Essential (primary) hypertension: Secondary | ICD-10-CM

## 2024-06-09 DIAGNOSIS — Z9889 Other specified postprocedural states: Secondary | ICD-10-CM

## 2024-06-09 LAB — COMPREHENSIVE METABOLIC PANEL WITH GFR
ALT: 15 U/L (ref 0–53)
AST: 19 U/L (ref 0–37)
Albumin: 4.5 g/dL (ref 3.5–5.2)
Alkaline Phosphatase: 46 U/L (ref 39–117)
BUN: 21 mg/dL (ref 6–23)
CO2: 27 meq/L (ref 19–32)
Calcium: 9.2 mg/dL (ref 8.4–10.5)
Chloride: 107 meq/L (ref 96–112)
Creatinine, Ser: 1.83 mg/dL — ABNORMAL HIGH (ref 0.40–1.50)
GFR: 35.3 mL/min — ABNORMAL LOW (ref >60.00)
Glucose, Bld: 155 mg/dL — ABNORMAL HIGH (ref 70–99)
Potassium: 4.6 meq/L (ref 3.5–5.1)
Sodium: 141 meq/L (ref 135–145)
Total Bilirubin: 0.7 mg/dL (ref 0.2–1.2)
Total Protein: 6.7 g/dL (ref 6.0–8.3)

## 2024-06-09 LAB — LIPID PANEL
Cholesterol: 150 mg/dL (ref 0–200)
HDL: 30.2 mg/dL — ABNORMAL LOW (ref >39.00)
LDL Cholesterol: 90 mg/dL (ref 0–99)
NonHDL: 119.95
Total CHOL/HDL Ratio: 5
Triglycerides: 151 mg/dL — ABNORMAL HIGH (ref 0.0–149.0)
VLDL: 30.2 mg/dL (ref 0.0–40.0)

## 2024-06-09 LAB — MICROALBUMIN / CREATININE URINE RATIO
Creatinine,U: 123.7 mg/dL
Microalb Creat Ratio: 56.9 mg/g — ABNORMAL HIGH (ref 0.0–30.0)
Microalb, Ur: 7 mg/dL — ABNORMAL HIGH (ref 0.0–1.9)

## 2024-06-09 LAB — CBC
HCT: 42 % (ref 39.0–52.0)
Hemoglobin: 14.1 g/dL (ref 13.0–17.0)
MCHC: 33.5 g/dL (ref 30.0–36.0)
MCV: 88.6 fl (ref 78.0–100.0)
Platelets: 161 K/uL (ref 150.0–400.0)
RBC: 4.74 Mil/uL (ref 4.22–5.81)
RDW: 14.3 % (ref 11.5–15.5)
WBC: 6.3 K/uL (ref 4.0–10.5)

## 2024-06-09 LAB — TSH: TSH: 1.6 u[IU]/mL (ref 0.35–5.50)

## 2024-06-09 LAB — VITAMIN D 25 HYDROXY (VIT D DEFICIENCY, FRACTURES): VITD: 82.3 ng/mL (ref 30.00–100.00)

## 2024-06-09 LAB — PSA, MEDICARE: PSA: 1.41 ng/mL (ref 0.10–4.00)

## 2024-06-09 MED ORDER — BUSPIRONE HCL 5 MG PO TABS: 5.0000 mg | ORAL_TABLET | Freq: Two times a day (BID) | ORAL | 1 refills | Status: DC

## 2024-06-09 NOTE — Assessment & Plan Note (Signed)
 Followed by neurology.  Seems adherent to CPAP therapy continue

## 2024-06-09 NOTE — Assessment & Plan Note (Addendum)
 History of the same.  Not currently on any medical therapies.  Pending PSA today.  Has been seen and evaluated by urology in the past.  States medical therapy did not seem to make a difference of symptomology

## 2024-06-09 NOTE — Assessment & Plan Note (Signed)
 Currently on levetiracetam  500 mg twice daily.  Currently followed by neurology.  Continue taking medication as prescribed and follow-up with specialist as recommended

## 2024-06-09 NOTE — Assessment & Plan Note (Signed)
History of the same pending vitamin D level today

## 2024-06-09 NOTE — Assessment & Plan Note (Signed)
 Patient currently maintained on omeprazole  40 mg daily.  Symptoms well-controlled continue medication as prescribed

## 2024-06-09 NOTE — Assessment & Plan Note (Signed)
 History of same.  No longer followed by neurosurgery.  Did review most recent MRI in 2024

## 2024-06-09 NOTE — Assessment & Plan Note (Signed)
 Maintained on citalopram  40 mg daily.  Patient does not feel like it is controlled.  Does have external stressors of dealing with parents and siblings states.  We will continue citalopram  40 mg daily.  Will add on BuSpar 5 mg twice daily.

## 2024-06-09 NOTE — Assessment & Plan Note (Signed)
 Patient currently followed by endocrinology.  Does have a CGM to monitor glucose levels.  Patient currently maintained on Mounjaro  5 mg weekly and metformin  500 mg twice daily

## 2024-06-09 NOTE — Assessment & Plan Note (Signed)
 Patient currently maintained on valsartan  160 mg daily.  Blood pressure controlled.  Continue medication as prescribed

## 2024-06-09 NOTE — Assessment & Plan Note (Signed)
 Patient currently maintained on rosuvastatin  10 mg daily and fenofibrate  160 mg daily.  Pending lipid panel today continue medications as prescribed

## 2024-06-09 NOTE — Progress Notes (Signed)
 Established Patient Office Visit  Subjective   Patient ID: Jeffery Reed, male    DOB: [birth year]  Age: 78 y.o. MRN: [id number]  Chief Complaint  Patient presents with   Annual Exam    HPI  HTN: Patient currently maintained on valsartan  160 mg daily. Can check it at home if he needs too   HLD: Currently maintained on rosuvastatin  10 mg daily and fenofibrate  160 mg daily  DM2: Currently maintained on Mounjaro  5 mg weekly, metformin  500 mg twice daily.  Patient has a CGM and is followed by endocrinology.  Mood: Patient currently maintained on citalopram  40 mg daily. States that he is not doing the best with the medication.  Patient mentions that he is picking and gets anxious.  He can be working or distracted and not have any difficulty at that juncture.  Recently had his hair cut and mentioned it to his barber and they recommended a hair lotion that he got but has not used yet  Seizures: Patient currently maintained on levetiracetam  500 mg twice daily.  Patient is followed by neurology  Hx of brain tumor post resectoin.  Patient longer follows with neurosurgery  OSA: Currently maintained on CPAP therapy and followed by neurology.  for complete physical and follow up of chronic conditions.  Immunizations: -Tetanus: Completed in? -Influenza: up date today  -Shingles: Completed Zostavax live -Pneumonia: Completed.  Diet: Fair diet. He is eating 3 meals a day. 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 He is drinking coffee in the am and sweet tea. He does try to cut back  Exercise: No regular exercise.  Eye exam: Completes annually. Needs updating   Dental exam: Completes semi-annually    Colonoscopy: Completed in 05/04/2022, no repeat due to age Lung Cancer Screening: N/A  PSA: Due  Advanced directive: Does have one        Review of Systems  Constitutional:  Negative for chills and fever.  Respiratory:  Negative for shortness of breath.   Cardiovascular:  Negative for chest pain  and leg swelling.  Gastrointestinal:  Negative for abdominal pain, blood in stool, constipation, diarrhea, nausea and vomiting.       Bm daily   Genitourinary:  Positive for frequency. Negative for dysuria and hematuria.  Neurological:  Negative for tingling and headaches.  Psychiatric/Behavioral:  Negative for hallucinations and suicidal ideas.       Objective:     BP 122/78   Pulse 88   Temp 98 F (36.7 C) (Oral)   Ht 5' 9 (1.753 m)   Wt 206 lb (93.4 kg)   SpO2 96%   BMI 30.42 kg/m  BP Readings from Last 3 Encounters:  06/09/24 122/78  03/28/24 120/70  11/27/23 120/60   Wt Readings from Last 3 Encounters:  06/09/24 206 lb (93.4 kg)  03/28/24 211 lb 9.6 oz (96 kg)  12/20/23 225 lb (102.1 kg)   SpO2 Readings from Last 3 Encounters:  06/09/24 96%  03/28/24 96%  11/27/23 96%      Physical Exam Vitals and nursing note reviewed.  Constitutional:      Appearance: Normal appearance.  HENT:     Right Ear: Tympanic membrane, ear canal and external ear normal.     Left Ear: Ear canal and external ear normal.     Ears:     Comments: Tube in the left TM    Mouth/Throat:     Mouth: Mucous membranes are moist.     Pharynx: Oropharynx is clear.  Eyes:  Extraocular Movements: Extraocular movements intact.     Pupils: Pupils are equal, round, and reactive to light.  Cardiovascular:     Rate and Rhythm: Normal rate and regular rhythm.     Pulses: Normal pulses.     Heart sounds: Normal heart sounds.  Pulmonary:     Effort: Pulmonary effort is normal.     Breath sounds: Normal breath sounds.  Abdominal:     General: Bowel sounds are normal. There is no distension.     Palpations: There is no mass.     Tenderness: There is no abdominal tenderness.     Hernia: No hernia is present.  Musculoskeletal:     Right lower leg: No edema.     Left lower leg: No edema.  Lymphadenopathy:     Cervical: No cervical adenopathy.  Skin:    General: Skin is warm.  Neurological:      General: No focal deficit present.     Mental Status: He is alert.     Deep Tendon Reflexes:     Reflex Scores:      Bicep reflexes are 2+ on the right side and 2+ on the left side.      Patellar reflexes are 2+ on the right side and 2+ on the left side.    Comments: Bilateral upper and lower extremity strength 5/5  Psychiatric:        Mood and Affect: Mood normal.        Behavior: Behavior normal.        Thought Content: Thought content normal.        Judgment: Judgment normal.      No results found for any visits on 06/09/24.    The 10-year ASCVD risk score (Arnett DK, et al., 2019) is: 49.6%    Assessment & Plan:   Problem List Items Addressed This Visit       Cardiovascular and Mediastinum   Essential hypertension - Primary   Patient currently maintained on valsartan  160 mg daily.  Blood pressure controlled.  Continue medication as prescribed      Relevant Orders   CBC   Comprehensive metabolic panel with GFR   Lipid panel   TSH   Microalbumin / creatinine urine ratio     Respiratory   OSA (obstructive sleep apnea)   Followed by neurology.  Seems adherent to CPAP therapy continue        Digestive   GERD (gastroesophageal reflux disease)   Patient currently maintained on omeprazole  40 mg daily.  Symptoms well-controlled continue medication as prescribed        Endocrine   Hyperlipidemia associated with type 2 diabetes mellitus (HCC)   Patient currently maintained on rosuvastatin  10 mg daily and fenofibrate  160 mg daily.  Pending lipid panel today continue medications as prescribed      Relevant Orders   Lipid panel   Type 2 diabetes mellitus with stage 3 chronic kidney disease, without long-term current use of insulin  [facility])   Patient currently followed by endocrinology.  Does have a CGM to monitor glucose levels.  Patient currently maintained on Mounjaro  5 mg weekly and metformin  500 mg twice daily      Relevant Orders   CBC   Comprehensive  metabolic panel with GFR   Lipid panel   Microalbumin / creatinine urine ratio     Genitourinary   BPH/Prostatism   History of the same.  Not currently on any medical therapies.  Pending PSA today.  Has been  seen and evaluated by urology in the past.  States medical therapy did not seem to make a difference of symptomology      Relevant Orders   PSA, Medicare     Other   Vitamin D  deficiency   History of the same pending vitamin D  level today      Relevant Orders   VITAMIN D  25 Hydroxy (Vit-D Deficiency, Fractures)   Anxiety   Maintained on citalopram  40 mg daily.  Patient does not feel like it is controlled.  Does have external stressors of dealing with parents and siblings states.  We will continue citalopram  40 mg daily.  Will add on BuSpar 5 mg twice daily.      Relevant Medications   busPIRone (BUSPAR) 5 MG tablet   Other Relevant Orders   TSH   Seizure-like activity (HCC)   Currently on levetiracetam  500 mg twice daily.  Currently followed by neurology.  Continue taking medication as prescribed and follow-up with specialist as recommended      Relevant Orders   CBC   Comprehensive metabolic panel with GFR   Hx of resection of meningioma   History of same.  No longer followed by neurosurgery.  Did review most recent MRI in 2024      Other Visit Diagnoses       Screening for prostate cancer       Relevant Orders   PSA, Medicare       Return in about 6 weeks (around 07/21/2024) for GAD.    Adina Crandall, NP

## 2024-06-09 NOTE — Patient Instructions (Addendum)
 Nice to see you today I will be in touch with the labs once I have reviewed them  We did up date your flu vaccine today  Follow up with me in 6 weeks for a recheck

## 2024-06-10 ENCOUNTER — Ambulatory Visit: Payer: Self-pay | Admitting: Nurse Practitioner

## 2024-06-10 ENCOUNTER — Encounter: Payer: BLUE CROSS/BLUE SHIELD | Admitting: Internal Medicine

## 2024-06-18 ENCOUNTER — Other Ambulatory Visit: Payer: Self-pay | Admitting: Nurse Practitioner

## 2024-06-18 DIAGNOSIS — E782 Mixed hyperlipidemia: Secondary | ICD-10-CM

## 2024-06-30 ENCOUNTER — Encounter (HOSPITAL_COMMUNITY): Payer: Self-pay

## 2024-06-30 ENCOUNTER — Emergency Department (HOSPITAL_COMMUNITY)
Admission: EM | Admit: 2024-06-30 | Discharge: 2024-06-30 | Disposition: A | Discharge status: Home / Self Care | Attending: Emergency Medicine | Admitting: Emergency Medicine

## 2024-06-30 ENCOUNTER — Emergency Department (HOSPITAL_COMMUNITY)

## 2024-06-30 ENCOUNTER — Other Ambulatory Visit: Payer: Self-pay

## 2024-06-30 DIAGNOSIS — I129 Hypertensive chronic kidney disease with stage 1 through stage 4 chronic kidney disease, or unspecified chronic kidney disease: Secondary | ICD-10-CM | POA: Diagnosis not present

## 2024-06-30 DIAGNOSIS — I609 Nontraumatic subarachnoid hemorrhage, unspecified: Secondary | ICD-10-CM

## 2024-06-30 DIAGNOSIS — S066X0A Traumatic subarachnoid hemorrhage without loss of consciousness, initial encounter: Secondary | ICD-10-CM | POA: Insufficient documentation

## 2024-06-30 DIAGNOSIS — S199XXA Unspecified injury of neck, initial encounter: Secondary | ICD-10-CM | POA: Diagnosis not present

## 2024-06-30 DIAGNOSIS — R569 Unspecified convulsions: Secondary | ICD-10-CM

## 2024-06-30 DIAGNOSIS — E1122 Type 2 diabetes mellitus with diabetic chronic kidney disease: Secondary | ICD-10-CM | POA: Insufficient documentation

## 2024-06-30 DIAGNOSIS — M47812 Spondylosis without myelopathy or radiculopathy, cervical region: Secondary | ICD-10-CM | POA: Diagnosis not present

## 2024-06-30 DIAGNOSIS — I10 Essential (primary) hypertension: Secondary | ICD-10-CM | POA: Diagnosis not present

## 2024-06-30 DIAGNOSIS — M4802 Spinal stenosis, cervical region: Secondary | ICD-10-CM | POA: Diagnosis not present

## 2024-06-30 DIAGNOSIS — G9389 Other specified disorders of brain: Secondary | ICD-10-CM | POA: Diagnosis not present

## 2024-06-30 DIAGNOSIS — Z7984 Long term (current) use of oral hypoglycemic drugs: Secondary | ICD-10-CM | POA: Insufficient documentation

## 2024-06-30 DIAGNOSIS — N183 Chronic kidney disease, stage 3 unspecified: Secondary | ICD-10-CM | POA: Insufficient documentation

## 2024-06-30 DIAGNOSIS — S0990XA Unspecified injury of head, initial encounter: Secondary | ICD-10-CM | POA: Diagnosis not present

## 2024-06-30 DIAGNOSIS — S065X0A Traumatic subdural hemorrhage without loss of consciousness, initial encounter: Secondary | ICD-10-CM | POA: Insufficient documentation

## 2024-06-30 DIAGNOSIS — Y92481 Parking lot as the place of occurrence of the external cause: Secondary | ICD-10-CM | POA: Insufficient documentation

## 2024-06-30 DIAGNOSIS — S065XAA Traumatic subdural hemorrhage with loss of consciousness status unknown, initial encounter: Secondary | ICD-10-CM

## 2024-06-30 DIAGNOSIS — R410 Disorientation, unspecified: Secondary | ICD-10-CM | POA: Diagnosis not present

## 2024-06-30 DIAGNOSIS — W19XXXA Unspecified fall, initial encounter: Secondary | ICD-10-CM | POA: Diagnosis not present

## 2024-06-30 DIAGNOSIS — W1830XA Fall on same level, unspecified, initial encounter: Secondary | ICD-10-CM | POA: Diagnosis not present

## 2024-06-30 DIAGNOSIS — S06310A Contusion and laceration of right cerebrum without loss of consciousness, initial encounter: Secondary | ICD-10-CM | POA: Diagnosis not present

## 2024-06-30 LAB — COMPREHENSIVE METABOLIC PANEL WITH GFR
ALT: 20 U/L (ref 0–44)
AST: 31 U/L (ref 15–41)
Albumin: 3.6 g/dL (ref 3.5–5.0)
Alkaline Phosphatase: 48 U/L (ref 38–126)
Anion gap: 15 (ref 5–15)
BUN: 28 mg/dL — ABNORMAL HIGH (ref 8–23)
CO2: 18 mmol/L — ABNORMAL LOW (ref 22–32)
Calcium: 9.2 mg/dL (ref 8.9–10.3)
Chloride: 108 mmol/L (ref 98–111)
Creatinine, Ser: 1.88 mg/dL — ABNORMAL HIGH (ref 0.61–1.24)
GFR, Estimated: 37 mL/min — ABNORMAL LOW (ref >60)
Glucose, Bld: 151 mg/dL — ABNORMAL HIGH (ref 70–99)
Potassium: 4.5 mmol/L (ref 3.5–5.1)
Sodium: 141 mmol/L (ref 135–145)
Total Bilirubin: 0.6 mg/dL (ref 0.0–1.2)
Total Protein: 6.2 g/dL — ABNORMAL LOW (ref 6.5–8.1)

## 2024-06-30 LAB — CBC
HCT: 38.2 % — ABNORMAL LOW (ref 39.0–52.0)
Hemoglobin: 12.5 g/dL — ABNORMAL LOW (ref 13.0–17.0)
MCH: 30.3 pg (ref 26.0–34.0)
MCHC: 32.7 g/dL (ref 30.0–36.0)
MCV: 92.5 fL (ref 80.0–100.0)
Platelets: 158 K/uL (ref 150–400)
RBC: 4.13 MIL/uL — ABNORMAL LOW (ref 4.22–5.81)
RDW: 13.9 % (ref 11.5–15.5)
WBC: 6.9 K/uL (ref 4.0–10.5)
nRBC: 0 % (ref 0.0–0.2)

## 2024-06-30 LAB — MAGNESIUM: Magnesium: 1.8 mg/dL (ref 1.7–2.4)

## 2024-06-30 LAB — CBG MONITORING, ED: Glucose-Capillary: 155 mg/dL — ABNORMAL HIGH (ref 70–99)

## 2024-06-30 MED ORDER — LEVETIRACETAM (KEPPRA) 500 MG/5 ML ADULT IV PUSH
1500.0000 mg | Freq: Once | INTRAVENOUS | Status: AC
  Administered 2024-06-30: 1500 mg via INTRAVENOUS
  Filled 2024-06-30: qty 15

## 2024-06-30 MED ORDER — LEVETIRACETAM 750 MG PO TABS: 750.0000 mg | ORAL_TABLET | Freq: Two times a day (BID) | ORAL | 0 refills | Status: AC

## 2024-06-30 NOTE — ED Provider Notes (Signed)
 [place] EMERGENCY DEPARTMENT AT [facility] Provider Note   CSN: [id number] Arrival date & time: 06/30/24  8144     Patient presents with: Seizures   Jeffery Reed is a 78 y.o. male.   78 year old male presents today for concern of seizure like activity that occurred prior to arrival.  He states he remembers ordering from a restaurant and walking out towards the car in the parking lot and the next thing he remembers he is here.  He did not make it to his car.  Bystanders saw seizure-like activity.  He reports compliance with his antiepileptic.  He states he takes Keppra  500 mg once daily.  He states this is how it has been prescribed to him and he has been taking this for years.  He does follow with neurologist.  He did strike his head on the sidewalk.  Does have a hematoma.  The history is provided by the patient. No language interpreter was used.       Prior to Admission medications   Medication Sig Start Date End Date Taking? Authorizing Provider  acetaminophen  (TYLENOL ) 650 MG CR tablet Take 650 mg by mouth every 8 (eight) hours as needed for pain (or headaches).    [provider]  albuterol  (VENTOLIN  HFA) 108 (90 Base) MCG/ACT inhaler Inhale into the lungs every 6 (six) hours as needed for wheezing or shortness of breath.    [provider]  busPIRone (BUSPAR) 5 MG tablet Take 1 tablet (5 mg total) by mouth 2 (two) times daily. 06/09/24   Wendee Lynwood HERO, NP  Cholecalciferol (VITAMIN D3) 10000 units capsule Take 10,000 Units by mouth in the morning.    [provider]  citalopram  (CELEXA ) 40 MG tablet TAKE 1 TABLET BY MOUTH DAILY FOR MOOD OR CHRONIC ANXIETY 03/04/24   Wendee Lynwood HERO, NP  Continuous Glucose Sensor (FREESTYLE LIBRE 3 PLUS SENSOR) MISC USE TO MONITOR GLUCOSE CONTINUOUSLY. CHANGE SENSOR EVERY 15 DAYS. 02/20/24   Trixie File, MD  fenofibrate  160 MG tablet Take  1 tablet  Daily  for Triglycerides ( Blood Fats)                                /                                                                   TAKE                                         BY                                                 MOUTH 02/03/23   Tonita Fallow, MD  fluticasone  (FLONASE ) 50 MCG/ACT nasal spray Place 1-2 sprays into both nostrils daily as needed for allergies or rhinitis.    [provider]  glucose blood (FREESTYLE LITE) test strip Check blood sugar 1 time daily-DX-E11.22 01/30/20   Craig Palma  R, PA-C  Insulin  Pen Needle 32G X 4 MM MISC Use 1x a day 08/13/23   Trixie File, MD  levETIRAcetam  (KEPPRA ) 500 MG tablet Take 1 tablet (500 mg total) by mouth 2 (two) times daily. 11/14/23   Whitfield Raisin, NP  metFORMIN  (GLUCOPHAGE ) 500 MG tablet Take 1 tablet (500 mg total) by mouth 2 (two) times daily with a meal. 11/27/23   Trixie File, MD  omeprazole  (PRILOSEC) 40 MG capsule Take  1 capdsule  Daily to Prevent Heartburn & Indigestion                                                                               /                                                                   TAKE                                         BY                                                 MOUTH 02/03/23   Tonita Fallow, MD  rosuvastatin  (CRESTOR ) 10 MG tablet TAKE 1 TABLET BY MOUTH DAILY FOR CHOLESTEROL 07/24/23   Wilkinson, Dana E, NP  Tetrahydroz-Glyc-Hyprom-PEG (VISINE MAXIMUM REDNESS RELIEF) 0.05-0.2-0.36-1 % SOLN Place 1-2 drops into both eyes 3 (three) times daily as needed (dry/irrtated eyes.).    [provider]  tirzepatide  (MOUNJARO ) 5 MG/0.5ML Pen Inject 5 mg into the skin once a week. 02/12/24   Trixie File, MD  triamcinolone  ointment (KENALOG ) 0.1 % Apply 1 Application topically 2 (two) times daily. 08/16/23   Wilkinson, Dana E, NP  valsartan  (DIOVAN ) 160 MG tablet Take 1 tablet daily for BP and Diabetic Kidney Protection 04/30/23   Cranford, Tonya, NP    Allergies: Cardura [doxazosin mesylate], Codeine, and  Hytrin [terazosin]    Review of Systems  Constitutional:  Negative for chills and fever.  Respiratory:  Negative for shortness of breath.   Cardiovascular:  Negative for chest pain.  Neurological:  Positive for seizures. Negative for light-headedness.  All other systems reviewed and are negative.   Updated Vital Signs BP (!) 148/75   Pulse 73   Temp 98.6 F (37 C) (Oral)   Resp 18   Ht 5' 10 (1.778 m)   Wt 93 kg   SpO2 98%   BMI 29.41 kg/m   Physical Exam Vitals and nursing note reviewed.  Constitutional:      General: He is not in acute distress.    Appearance: Normal appearance. He is not ill-appearing.  HENT:     Head: Normocephalic and atraumatic.     Nose: Nose normal.  Eyes:     Conjunctiva/sclera: Conjunctivae normal.  Cardiovascular:     Rate and Rhythm: Normal rate and regular rhythm.  Pulmonary:     Effort: Pulmonary effort is normal. No respiratory distress.  Musculoskeletal:        General: No deformity. Normal range of motion.     Cervical back: Normal range of motion.  Skin:    Findings: No rash.  Neurological:     General: No focal deficit present.     Mental Status: He is alert and oriented to person, place, and time. Mental status is at baseline.     Cranial Nerves: No cranial nerve deficit.     Motor: No weakness.     Comments: Cranial nerves III through XII intact.  Good range of motion in upper and lower extremities with good strength.  No facial droop.  Normal speech.  Tongue midline.     (all labs ordered are listed, but only abnormal results are displayed) Labs Reviewed  CBC - Abnormal; Notable for the following components:      Result Value   RBC 4.13 (*)    Hemoglobin 12.5 (*)    HCT 38.2 (*)    All other components within normal limits  CBG MONITORING, ED - Abnormal; Notable for the following components:   Glucose-Capillary 155 (*)    All other components within normal limits  COMPREHENSIVE METABOLIC PANEL WITH GFR   LEVETIRACETAM  LEVEL  MAGNESIUM    EKG: None  Radiology: No results found.   Procedures   Medications Ordered in the ED  levETIRAcetam  (KEPPRA ) undiluted injection 1,500 mg (1,500 mg Intravenous Given 06/30/24 1927)    Clinical Course as of 06/30/24 2259  Mon Jun 30, 2024  2216 Spoke to Dr. Vanessa with neurology regarding patient's seizures.  After reviewing pharmacy medication dispense report it appears he has not had his Keppra  refilled in the past 6 months.  Will ask patient where he gets this filled from.  He stated that he takes this as directed on the bottle so if it says twice a day he is likely taking this twice a day. If he is not been taking this we will continue current regimen of 500 twice daily and give him refills.  If he is taking this as directed we will increase his dose to 750 twice daily. Appreciate consult. [AA]    Clinical Course User Index [AA] Hildegard Loge, PA-C                                 Medical Decision Making Amount and/or Complexity of Data Reviewed Labs: ordered. Radiology: ordered.  Risk Prescription drug management.   Medical Decision Making / ED Course   This patient presents to the ED for concern of seizure-like activity, this involves an extensive number of treatment options, and is a complaint that carries with it a high risk of complications and morbidity.  The differential diagnosis includes seizures, syncope, head injury , MDM: 78 year old male presents today for concern of seizure-like activity. He reports compliance with his Keppra . He states he takes this once daily.  Looking at most recent chart review it appears he should be taking this twice daily. Will Keppra  load. Will obtain labs. Obtain CT imaging.  CT imaging with evidence of subdural and subarachnoid.  I spoke with Dr. Lanis who states these can be reevaluated in the outpatient setting in 2 to 3 weeks with repeat  CT scan.  Patient's primary neurosurgeon is  Dr. Colon.  He will follow-up with him.  Spoke with neurology Dr. Vanessa who recommended increasing patient's Keppra  to 750 twice daily if they are currently compliant with her current regimen which patient states he is.  Patient will follow-up with the specialist above.  Neurology referral entered into the system.  Discharged in stable condition.  Return precaution discussed.  Patient voices understanding and is in agreement with the plan.   Lab Tests: -I ordered, reviewed, and interpreted labs.   The pertinent results include:   Labs Reviewed  CBC - Abnormal; Notable for the following components:      Result Value   RBC 4.13 (*)    Hemoglobin 12.5 (*)    HCT 38.2 (*)    All other components within normal limits  CBG MONITORING, ED - Abnormal; Notable for the following components:   Glucose-Capillary 155 (*)    All other components within normal limits  COMPREHENSIVE METABOLIC PANEL WITH GFR  LEVETIRACETAM  LEVEL  MAGNESIUM      EKG  EKG Interpretation Date/Time:    Ventricular Rate:    PR Interval:    QRS Duration:    QT Interval:    QTC Calculation:   R Axis:      Text Interpretation:           Imaging Studies ordered: I ordered imaging studies including CT head, CT C-spine I independently visualized and interpreted imaging. I agree with the radiologist interpretation   Medicines ordered and prescription drug management: Meds ordered this encounter  Medications   levETIRAcetam  (KEPPRA ) undiluted injection 1,500 mg    -I have reviewed the patients home medicines and have made adjustments as needed  Social Determinants of Health:  Factors impacting patients care include: Good outpatient follow-up   Reevaluation: After the interventions noted above, I reevaluated the patient and found that they have :improved  Co morbidities that complicate the patient evaluation  Past Medical History:  Diagnosis Date   Allergy    Anxiety    Cataract     beginning stage 01/22/2019   Chronic kidney disease    enlarge bladder   Diabetes mellitus without complication (HCC)    Type 2   GERD (gastroesophageal reflux disease)    Gout    HOH (hard of hearing)    bilateral, no aids   Hyperlipidemia    Hypertension    OSA (obstructive sleep apnea)    uses CPAP   Seizures (HCC)    last 3 years ago in MAY/ benign brain tumor   Sleep apnea    c pap   Type 2 diabetes mellitus with stage 3 chronic kidney disease, without long-term current use of insulin  (HCC) 08/17/2013   Urinary frequency       Dispostion: Discharged in stable condition.  Return precaution discussed.  Patient voices understanding and is in agreement with plan.  Final diagnoses:  Seizure-like activity (HCC)  Subdural hematoma (HCC)  Subarachnoid hemorrhage [facility])    ED Discharge Orders          Ordered    Ambulatory referral to Neurology       Comments: An appointment is requested in approximately: 2 weeks   06/30/24 2255    levETIRAcetam  (KEPPRA ) 750 MG tablet  2 times daily        06/30/24 2258               Hildegard Loge, PA-C 06/30/24 2300    Mannie,  Fairy DASEN, DO 07/01/24 2329

## 2024-06-30 NOTE — ED Triage Notes (Signed)
 Pt BIBA from scene, per bystanders pt was standing in a parking lot, appeared confused then fell and had tonic clonic seizure for approx 3-5 minutes. Pt has seizure hx, last seizure 3 years ago. Pt states he does take seizure medications, has not missed any doses, unsure of the name at this time. Pt AAO on arrival. Pt w/ large hematoma posterior head, is not on blood thinners

## 2024-06-30 NOTE — Discharge Instructions (Signed)
 Your CT scan showed evidence of subdural hematoma and small subarachnoid hemorrhage.  I discussed your case with Dr. Lanis of neurosurgery who recommends repeating a CT scan in 2 to 3 weeks to ensure that this is stable and not getting significantly worse.  Please follow-up with your neurosurgeon Dr. Colon to have this performed and scheduled.  Follow-up with your neurologist. You stated that you have been taking Keppra  500 mg twice daily.  I spoke to the neurologist and they recommended increasing your dose to 750 mg twice daily. Take Tylenol  as you need to for pain control. Return for any emergent symptoms.

## 2024-07-02 LAB — LEVETIRACETAM LEVEL: Levetiracetam Lvl: <2 ug/mL — ABNORMAL LOW (ref 10.0–40.0)

## 2024-07-09 ENCOUNTER — Other Ambulatory Visit (HOSPITAL_COMMUNITY): Payer: Self-pay | Admitting: Neurological Surgery

## 2024-07-09 DIAGNOSIS — S065XAA Traumatic subdural hemorrhage with loss of consciousness status unknown, initial encounter: Secondary | ICD-10-CM

## 2024-07-22 ENCOUNTER — Ambulatory Visit: Admitting: Nurse Practitioner

## 2024-07-22 ENCOUNTER — Telehealth: Payer: Self-pay | Admitting: Nurse Practitioner

## 2024-07-22 VITALS — BP 124/60 | HR 70 | Temp 97.7°F | Ht 70.0 in | Wt 208.4 lb

## 2024-07-22 DIAGNOSIS — S065XAA Traumatic subdural hemorrhage with loss of consciousness status unknown, initial encounter: Secondary | ICD-10-CM | POA: Insufficient documentation

## 2024-07-22 DIAGNOSIS — I609 Nontraumatic subarachnoid hemorrhage, unspecified: Secondary | ICD-10-CM | POA: Diagnosis not present

## 2024-07-22 DIAGNOSIS — R569 Unspecified convulsions: Secondary | ICD-10-CM | POA: Diagnosis not present

## 2024-07-22 DIAGNOSIS — F419 Anxiety disorder, unspecified: Secondary | ICD-10-CM | POA: Diagnosis not present

## 2024-07-22 NOTE — Telephone Encounter (Signed)
 Spoke with pt's wife, pt is currently not at home. She will have him call back to schedule a sooner appt

## 2024-07-22 NOTE — Progress Notes (Signed)
 Established Patient Office Visit  Subjective   Patient ID: Jeffery Reed, male    DOB: Nov 06, 1945  Age: 78 y.o. MRN: [id number]  Chief Complaint  Patient presents with   Follow-up    Pt complains that GAD has been fair     HPI  Discussed the use of AI scribe software for clinical note transcription with the patient, who gave verbal consent to proceed.  History of Present Illness Jeffery Reed is a 78 year old male with anxiety and seizures who presents for follow-up on anxiety management and recent seizure episode.  He experiences ongoing anxiety despite being on citalopram  40 mg daily. Recently, he was started on buspirone  5 mg twice a day, which has led to some improvement. Anxiety attacks now occur approximately once a week, a reduction from previous frequency. He identifies external stressors related to family and estate matters as contributing factors.  He experienced a seizure episode while getting food, resulting in a fall and subsequent hospital visit. He does not recall the event but was informed that he was taken to the hospital. He has a history of a tumor three years ago and is on seizure medication, which was increased from 500 mg to 750 mg following the episode. He is scheduled for a CT scan and has a follow-up appointment with neurology in March.  The emergency department did find a right subdural hematoma, left parietal subarachnoid hemorrhage and left posterior parietal scalp hematoma.  No recent headaches or additional falls; he notes some soreness in his neck and a bruise where he fell. No fever, chills, chest pain, or shortness of breath. His sleep is generally fine, though he wakes up two to three times a night to use the bathroom. He denies any thoughts of self-harm or harm to others.  He monitors his blood sugar with a continuous glucose monitor, noting a reading of 150 mg/dL, consistent with hospital readings.      07/22/2024    9:03 AM 12/20/2023     4:00 PM 06/02/2023   10:24 PM  PHQ9 SCORE ONLY  PHQ-9 Total Score 0 0  0      Data saved with a previous flowsheet row definition       07/22/2024    9:03 AM  GAD 7 : Generalized Anxiety Score  Nervous, Anxious, on Edge 0  Control/stop worrying 0  Worry too much - different things 0  Trouble relaxing 0  Restless 0  Easily annoyed or irritable 0  Afraid - awful might happen 0  Total GAD 7 Score 0  Anxiety Difficulty Not difficult at all        Review of Systems  Constitutional:  Negative for chills and fever.  Respiratory:  Negative for shortness of breath.   Cardiovascular:  Negative for chest pain.  Neurological:  Negative for dizziness and headaches.  Psychiatric/Behavioral:  Negative for hallucinations and suicidal ideas. The patient does not have insomnia.       Objective:     BP 124/60   Pulse 70   Temp 97.7 F (36.5 C) (Oral)   Ht 5' 10 (1.778 m)   Wt 208 lb 6.4 oz (94.5 kg)   SpO2 98%   BMI 29.90 kg/m    Physical Exam Vitals and nursing note reviewed.  Constitutional:      Appearance: Normal appearance.  HENT:     Right Ear: Tympanic membrane, ear canal and external ear normal.     Left Ear: Tympanic  membrane, ear canal and external ear normal.     Ears:     Comments: Scarring on left TM  Tube in right TM    Mouth/Throat:     Mouth: Mucous membranes are moist.     Pharynx: Oropharynx is clear.  Eyes:     Extraocular Movements: Extraocular movements intact.     Pupils: Pupils are equal, round, and reactive to light.  Cardiovascular:     Rate and Rhythm: Normal rate and regular rhythm.     Heart sounds: Normal heart sounds.  Pulmonary:     Effort: Pulmonary effort is normal.     Breath sounds: Normal breath sounds.  Neurological:     General: No focal deficit present.     Mental Status: He is alert.     Deep Tendon Reflexes:     Reflex Scores:      Bicep reflexes are 2+ on the right side and 2+ on the left side.      Patellar reflexes  are 2+ on the right side and 2+ on the left side.    Comments: Bilateral upper and lower extremity strength 5/5      No results found for any visits on 07/22/24.    The 10-year ASCVD risk score (Arnett DK, et al., 2019) is: 52.8%    Assessment & Plan:   Problem List Items Addressed This Visit       Nervous and Auditory   Subarachnoid hemorrhage (HCC)   Subdural hematoma (HCC) - Primary     Other   Anxiety   Seizures (HCC)  Assessment and Plan Assessment & Plan Generalized anxiety disorder Anxiety improved with buspirone ; weekly attacks persist. Current dose low, no suicidal ideation. - Continue buspirone  at current dose. - Provided 90-day supply of buspirone . - Monitor symptoms, adjust medication if necessary.  Epilepsy Recent seizure-like episode with loss of consciousness. No missed levetiracetam  doses. CT scan done. Neurology follow-up in March. - Continue levetiracetam  as prescribed. - Follow up with neurology in March. - Notified neurology of recent hospital visit for potential earlier appointment. - Did review hospital note imaging and blood work.  Type 2 diabetes mellitus Blood glucose well-controlled, recent readings ~150 mg/dL. Using continuous glucose monitor. - Continue current diabetes management plan. - Follow up with diabetic doctor on Monday.  General Health Maintenance - Continue routine health maintenance.    Return in about 3 months (around 10/20/2024) for GAD/Stress.    Adina Crandall, NP

## 2024-07-22 NOTE — Telephone Encounter (Signed)
 Thank you for letting me know. Reviewed hospital notes. Appears he was only taking Keppra  500mg  daily but was rx'd 500mg  twice daily. Hospital also questions compliance and had not obtained refill in 6 months, unclear accuracy of this but keppra  level not detected on lab work. Ill see if we can get him scheduled sooner for follow up visit to discuss all of this. Thank you.   [place], can you contact patient to schedule sooner follow up visit for recent seizure? Thank you!

## 2024-07-22 NOTE — Telephone Encounter (Signed)
 Jeffery Reed has a follow up with you scheduled in March of 2026. Just wanted to update that he had recent seizure like activity and they increased his Keppra . Did not know if you wanted to see him sooner.  Thanks for you time  [employer]

## 2024-07-22 NOTE — Patient Instructions (Signed)
 Nice to see you today  We will keep the medications as is Follow up with neurosurgery as recommended Follow up with me in 3 months sooner if you need me

## 2024-07-23 ENCOUNTER — Ambulatory Visit (HOSPITAL_COMMUNITY)
Admission: RE | Admit: 2024-07-23 | Discharge: 2024-07-23 | Attending: Neurological Surgery | Admitting: Neurological Surgery

## 2024-07-23 DIAGNOSIS — S065XAA Traumatic subdural hemorrhage with loss of consciousness status unknown, initial encounter: Secondary | ICD-10-CM | POA: Insufficient documentation

## 2024-07-23 DIAGNOSIS — G9389 Other specified disorders of brain: Secondary | ICD-10-CM | POA: Diagnosis not present

## 2024-07-23 DIAGNOSIS — I6200 Nontraumatic subdural hemorrhage, unspecified: Secondary | ICD-10-CM | POA: Diagnosis not present

## 2024-07-23 DIAGNOSIS — R55 Syncope and collapse: Secondary | ICD-10-CM | POA: Diagnosis not present

## 2024-07-25 DIAGNOSIS — Z6832 Body mass index (BMI) 32.0-32.9, adult: Secondary | ICD-10-CM | POA: Diagnosis not present

## 2024-07-25 DIAGNOSIS — S065XAA Traumatic subdural hemorrhage with loss of consciousness status unknown, initial encounter: Secondary | ICD-10-CM | POA: Diagnosis not present

## 2024-07-28 ENCOUNTER — Other Ambulatory Visit: Payer: Self-pay | Admitting: Nurse Practitioner

## 2024-07-28 DIAGNOSIS — W57XXXA Bitten or stung by nonvenomous insect and other nonvenomous arthropods, initial encounter: Secondary | ICD-10-CM

## 2024-07-28 DIAGNOSIS — F419 Anxiety disorder, unspecified: Secondary | ICD-10-CM

## 2024-08-01 ENCOUNTER — Encounter: Payer: Self-pay | Admitting: Internal Medicine

## 2024-08-01 ENCOUNTER — Ambulatory Visit: Admitting: Internal Medicine

## 2024-08-01 VITALS — BP 124/70 | HR 73 | Ht 70.0 in | Wt 207.6 lb

## 2024-08-01 DIAGNOSIS — Z7985 Long-term (current) use of injectable non-insulin antidiabetic drugs: Secondary | ICD-10-CM | POA: Diagnosis not present

## 2024-08-01 DIAGNOSIS — Z7984 Long term (current) use of oral hypoglycemic drugs: Secondary | ICD-10-CM | POA: Diagnosis not present

## 2024-08-01 DIAGNOSIS — E663 Overweight: Secondary | ICD-10-CM | POA: Diagnosis not present

## 2024-08-01 DIAGNOSIS — E7849 Other hyperlipidemia: Secondary | ICD-10-CM

## 2024-08-01 DIAGNOSIS — N1832 Chronic kidney disease, stage 3b: Secondary | ICD-10-CM | POA: Diagnosis not present

## 2024-08-01 DIAGNOSIS — E1122 Type 2 diabetes mellitus with diabetic chronic kidney disease: Secondary | ICD-10-CM

## 2024-08-01 LAB — POCT GLYCOSYLATED HEMOGLOBIN (HGB A1C): Hemoglobin A1C: 7.2 % — AB (ref 4.0–5.6)

## 2024-08-01 MED ORDER — METFORMIN HCL 500 MG PO TABS: 500.0000 mg | ORAL_TABLET | Freq: Two times a day (BID) | ORAL | 3 refills | Status: AC

## 2024-08-01 MED ORDER — TIRZEPATIDE 7.5 MG/0.5ML ~~LOC~~ SOAJ: 7.5000 mg | SUBCUTANEOUS | 3 refills | Status: AC

## 2024-08-01 NOTE — Progress Notes (Signed)
 Patient ID: Jeffery Reed, male   DOB: Dec 18, 1945, 78 y.o.   MRN: [id number]   HPI: Jeffery Reed is a 78 y.o.-year-old male, initially referred by his PCP, Wendee Lynwood HERO, NP, returning for follow-up for DM2, dx in 2018, insulin -dependent since 12/2019, uncontrolled, with complications (CKD stage III, diabetic retinopathy).  Last visit 4 mo ago.  Interim history: No blurry vision, nausea, chest pain.  He is active working in his garden when the weather is nice. Since last visit, he had seizure-like activity and presented to the emergency room 06/30/2024.  He was found to have an SDH and an SAH, from hitting his head on the sidewalk.  He is feeling well now.  Reviewed HbA1c levels: Lab Results  Component Value Date   HGBA1C 6.4 (A) 03/28/2024   HGBA1C 6.2 (A) 11/27/2023   HGBA1C 7.6 (A) 10/02/2023   HGBA1C 8.3 (A) 08/13/2023   HGBA1C 8.1 (H) 06/11/2023   HGBA1C 8.2 (H) 05/29/2023   HGBA1C 8.0 (H) 02/13/2023   HGBA1C 6.0 (H) 10/18/2022   HGBA1C 5.9 (A) 10/12/2022   HGBA1C 6.3 (H) 07/05/2022   He was previously on: - Metformin  ER 1000 mg 2x a day, with meals >> ... >> 500 mg 2-3x a day  - Glipizide  05-25-09 >> .SABRA. 10 mg 2x a day, before meals - Trulicity  >> Bydureon  y per insurance preference >> Trulicity  4.5 mg weekly >> he did not start Mounjaro  as he was not given this from the pharmacy (unclear why) but he did stop Trulicity ... - Tresiba  10 >> 8 >> Lantus  8  >> 4-6 units at bedtime >> stopped 04/2020  >> restarted 07/2023, but he actually takes it once a week! Ozempic  was not covered in 2024.  Then on: - Metformin  500 mg 2x a day - Glipizide  10 mg 2x a day before meals - Mounjaro  5 mg weekly -he obtains this from the pharmacy but is not taking it as he had some lower blood sugars, in the 60s - Lantus  10 units in am [increase by 2 units every 3 days until sugars are at goal (<130)] -he tells me that he is taking 0.2 units, which I advised him it is not possible.  I believe he  is taking 12 units, but this is unclear...  At last visit I recommended: - Metformin  500 mg 2x a day - Mounjaro  5 mg weekly - started 09/2023 We stopped Lantus  09/2023. We stopped glipizide  03/2024 due to low blood sugars.  He was taking it after meals at that time.  Pt checks his sugars >4x a day - with receiver:  Previously:  Previously:  Lowest sugar was 60s >> 65 >> 55 >> 50 (CGM); he has hypoglycemia awareness at 100. Highest sugar was 300s >> 300s.  Glucometer: Freestyle  Pt's meals are: - Breakfast:  Instant oatmeal or [employer] sausage bisuit or scrambled eggs - Lunch: tomato sandwich, ham - Dinner: larger meal: potatoes; spaghetti, lasagna, chicken, veggies: green beans, corn, broccoli - Snacks:potato chips, apples Diet sodas, unsweet tea,  -+ CKD stage 3b-sees nephrology, last BUN/creatinine:  Lab Results  Component Value Date   BUN 28 (H) 06/30/2024   BUN 21 06/09/2024   CREATININE 1.88 (H) 06/30/2024   CREATININE 1.83 (H) 06/09/2024   Lab Results  Component Value Date   MICRALBCREAT 56.9 (H) 06/09/2024   MICRALBCREAT 31 (H) 05/29/2023   MICRALBCREAT 15 03/29/2022   MICRALBCREAT 3 03/03/2021   MICRALBCREAT 6 03/02/2020   MICRALBCREAT 4 12/12/2018  MICRALBCREAT 6 10/23/2017   MICRALBCREAT 3 09/21/2016   MICRALBCREAT 3 09/03/2015   MICRALBCREAT 3.8 08/19/2014  On valsartan  320.  -+ HL; last set of lipids: Lab Results  Component Value Date   CHOL 150 06/09/2024   HDL 30.20 (L) 06/09/2024   LDLCALC 90 06/09/2024   TRIG 151.0 (H) 06/09/2024   CHOLHDL 5 06/09/2024  On Crestor  10, fenofibrate  160.  - last eye exam was in 05/2023: No DR; prev.+ DR, + small cataract. [facility].  -+ Mild, occasional numbness and tingling in his feet.  Foot exam performed 03/28/2024.  Pt has no FH of DM.  He also has HTN, OSA. Patient has a history of meningioma surgery in 2021.  He recovered well afterwards.  However, he had a seizure in 2022  >> started  Keppra .  He had another seizure in 06/2024 as mentioned above.  He did take Keppra  consistently before the episode.  He continues on it.  He is a retired it sales professional.  During the winter, he drives a schoolbus.   ROS: + See HPI  I reviewed pt's medications, allergies, PMH, social hx, family hx, and changes were documented in the history of present illness. Otherwise, unchanged from my initial visit note.  Past Medical History:  Diagnosis Date   Allergy    Anxiety    Cataract    beginning stage 01/22/2019   Chronic kidney disease    enlarge bladder   Diabetes mellitus without complication (HCC)    Type 2   GERD (gastroesophageal reflux disease)    Gout    HOH (hard of hearing)    bilateral, no aids   Hyperlipidemia    Hypertension    OSA (obstructive sleep apnea)    uses CPAP   Seizures (HCC)    last 3 years ago in MAY/ benign brain tumor   Sleep apnea    c pap   Type 2 diabetes mellitus with stage 3 chronic kidney disease, without long-term current use of insulin  (HCC) 08/17/2013   Urinary frequency    Past Surgical History:  Procedure Laterality Date   COLONOSCOPY  2020   Dr.Jacobs   COLONOSCOPY WITH PROPOFOL  N/A 05/04/2022   Procedure: COLONOSCOPY WITH PROPOFOL ;  Surgeon: Legrand Victory LITTIE DOUGLAS, MD;  Location: [facility];  Service: Gastroenterology;  Laterality: N/A;   CRANIOTOMY Right 12/29/2019   Procedure: Craniotomy for Tumor;  Surgeon: Colon Victory, MD;  Location: [facility];  Service: Neurosurgery;  Laterality: Right;  Craniotomy for Tumor Excision   FOOT SURGERY  10/2009   MYRINGOTOMY Left 02/2005   Dr.Newman   MYRINGOTOMY WITH TUBE PLACEMENT Left 10/11/2022   Procedure: LEFT MYRINGOTOMY WITH TUBE PLACEMENT (BUTTERFLY);  Surgeon: Milissa Hamming, MD;  Location: [facility];  Service: ENT;  Laterality: Left;  Diabetic   POLYPECTOMY  05/04/2022   Procedure: POLYPECTOMY;  Surgeon: Legrand Victory LITTIE DOUGLAS, MD;  Location: [facility];  Service: Gastroenterology;;    TONSILLECTOMY  1953   Social History   Socioeconomic History   Marital status: Married    Spouse name: Peggy   Number of children: 1   Years of education: Not on file   Highest education level: Not on file  Occupational History   Occupation: Midwife    Comment: on STD 10/25/20  Tobacco Use   Smoking status: Never   Smokeless tobacco: Never  Vaping Use   Vaping status: Never Used  Substance and Sexual Activity   Alcohol use: No   Drug use: No  Sexual activity: Not on file  Other Topics Concern   Not on file  Social History Narrative   Lives with wife   Right handed   Drinks 3-4 cups caffeine daily      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    Social Drivers of Health   Tobacco Use: Low Risk (06/30/2024)   Patient History    Smoking Tobacco Use: Never    Smokeless Tobacco Use: Never    Passive Exposure: Not on file  Financial Resource Strain: Low Risk (12/20/2023)   Overall Financial Resource Strain (CARDIA)    Difficulty of Paying Living Expenses: Not hard at all  Food Insecurity: No Food Insecurity (12/20/2023)   Hunger Vital Sign    Worried About Running Out of Food in the Last Year: Never true    Ran Out of Food in the Last Year: Never true  Transportation Needs: No Transportation Needs (12/20/2023)   PRAPARE - Administrator, Civil Service (Medical): No    Lack of Transportation (Non-Medical): No  Physical Activity: Inactive (12/20/2023)   Exercise Vital Sign    Days of Exercise per Week: 0 days    Minutes of Exercise per Session: 0 min  Stress: No Stress Concern Present (12/20/2023)   [employer] of Occupational Health - Occupational Stress Questionnaire    Feeling of Stress : Not at all  Social Connections: Moderately Isolated (12/20/2023)   Social Connection and Isolation Panel    Frequency of Communication with Friends and Family: More than three times a week    Frequency of Social Gatherings with Friends and Family: Once a week     Attends Religious Services: Never    Database Administrator or Organizations: No    Attends Banker Meetings: Never    Marital Status: Married  Catering Manager Violence: Not At Risk (12/20/2023)   Humiliation, Afraid, Rape, and Kick questionnaire    Fear of Current or Ex-Partner: No    Emotionally Abused: No    Physically Abused: No    Sexually Abused: No  Depression (PHQ2-9): Low Risk (07/22/2024)   Depression (PHQ2-9)    PHQ-2 Score: 0  Alcohol Screen: Low Risk (12/20/2023)   Alcohol Screen    Last Alcohol Screening Score (AUDIT): 0  Housing: Unknown (12/20/2023)   Housing Stability Vital Sign    Unable to Pay for Housing in the Last Year: No    Number of Times Moved in the Last Year: Not on file    Homeless in the Last Year: No  Utilities: Not At Risk (12/20/2023)   AHC Utilities    Threatened with loss of utilities: No  Health Literacy: Adequate Health Literacy (12/20/2023)   B1300 Health Literacy    Frequency of need for help with medical instructions: Never   Current Outpatient Medications on File Prior to Visit  Medication Sig Dispense Refill   acetaminophen  (TYLENOL ) 650 MG CR tablet Take 650 mg by mouth every 8 (eight) hours as needed for pain (or headaches).     albuterol  (VENTOLIN  HFA) 108 (90 Base) MCG/ACT inhaler Inhale into the lungs every 6 (six) hours as needed for wheezing or shortness of breath.     busPIRone  (BUSPAR ) 5 MG tablet TAKE 1 TABLET BY MOUTH TWICE A DAY 60 tablet 1   Cholecalciferol (VITAMIN D3) 10000 units capsule Take 10,000 Units by mouth in the morning.     citalopram  (CELEXA ) 40 MG tablet TAKE 1 TABLET BY MOUTH DAILY FOR  MOOD OR CHRONIC ANXIETY 90 tablet 3   Continuous Glucose Sensor (FREESTYLE LIBRE 3 PLUS SENSOR) MISC USE TO MONITOR GLUCOSE CONTINUOUSLY. CHANGE SENSOR EVERY 15 DAYS. 6 each 3   fenofibrate  160 MG tablet Take  1 tablet  Daily  for Triglycerides ( Blood Fats)                               /                                                                    TAKE                                         BY                                                 MOUTH 90 tablet 3   fluticasone  (FLONASE ) 50 MCG/ACT nasal spray Place 1-2 sprays into both nostrils daily as needed for allergies or rhinitis.     glucose blood (FREESTYLE LITE) test strip Check blood sugar 1 time daily-[id number] 100 each 5   Insulin  Pen Needle 32G X 4 MM MISC Use 1x a day 100 each 3   levETIRAcetam  (KEPPRA ) 750 MG tablet Take 1 tablet (750 mg total) by mouth 2 (two) times daily. 180 tablet 0   metFORMIN  (GLUCOPHAGE ) 500 MG tablet Take 1 tablet (500 mg total) by mouth 2 (two) times daily with a meal.     omeprazole  (PRILOSEC) 40 MG capsule Take  1 capdsule  Daily to Prevent Heartburn & Indigestion                                                                               /                                                                   TAKE                                         BY                                                 MOUTH 90 capsule  3   rosuvastatin  (CRESTOR ) 10 MG tablet TAKE 1 TABLET BY MOUTH DAILY FOR CHOLESTEROL 90 tablet 3   Tetrahydroz-Glyc-Hyprom-PEG (VISINE MAXIMUM REDNESS RELIEF) 0.05-0.2-0.36-1 % SOLN Place 1-2 drops into both eyes 3 (three) times daily as needed (dry/irrtated eyes.).     tirzepatide  (MOUNJARO ) 5 MG/0.5ML Pen Inject 5 mg into the skin once a week. 6 mL 3   triamcinolone  ointment (KENALOG ) 0.1 % Apply 1 Application topically 2 (two) times daily. 80 g 1   valsartan  (DIOVAN ) 160 MG tablet Take 1 tablet daily for BP and Diabetic Kidney Protection 90 tablet 3   No current facility-administered medications on file prior to visit.   Allergies  Allergen Reactions   Cardura [Doxazosin Mesylate] Other (See Comments)    Nasal congestion   Codeine Other (See Comments)    The patient passed out   Hytrin [Terazosin] Other (See Comments)    Reaction not recalled   Family History  Problem Relation Age of Onset    Hypertension Mother    GER disease Mother    Hyperlipidemia Father    Hypertension Father    Parkinson's disease Father    Hypertension Brother    Diabetes Maternal Grandmother    Stroke Paternal Grandmother    Diabetes Paternal Grandmother    Stroke Paternal Grandfather    Colon cancer Neg Hx    Colon polyps Neg Hx    Esophageal cancer Neg Hx    Stomach cancer Neg Hx    Rectal cancer Neg Hx    PE: BP 124/70   Pulse 73   Ht 5' 10 (1.778 m)   Wt 207 lb 9.6 oz (94.2 kg)   SpO2 98%   BMI 29.79 kg/m  Wt Readings from Last 10 Encounters:  08/01/24 207 lb 9.6 oz (94.2 kg)  07/22/24 208 lb 6.4 oz (94.5 kg)  06/30/24 205 lb (93 kg)  06/09/24 206 lb (93.4 kg)  03/28/24 211 lb 9.6 oz (96 kg)  12/20/23 225 lb (102.1 kg)  11/27/23 225 lb 9.6 oz (102.3 kg)  11/19/23 222 lb 6.4 oz (100.9 kg)  11/14/23 234 lb (106.1 kg)  10/02/23 231 lb 6.4 oz (105 kg)  . Constitutional: overweight, in NAD, + shuffling gait Eyes:EOMI, no exophthalmos ENT: no thyromegaly, no cervical lymphadenopathy Cardiovascular: RRR, No MRG Respiratory: CTA B Musculoskeletal: no deformities Skin:  no rashes Neurological: + mild tremor with outstretched R hand  ASSESSMENT: 1. DM2, insulin -dependent, uncontrolled, with long-term complications - CKD stage 3 - DR  2.  Hyperlipidemia  3.  Overweight  PLAN:  1. Patient with longstanding, uncontrolled, type 2 diabetes, on metformin , sulfonylurea, and weekly GLP-1/GIP receptor agonist, changed from Trulicity  to Mounjaro .  His HbA1c was excellent at last visit, at 6.4%, slightly higher than before, but still at goal.  Sugars are mainly fluctuating within the target range but he had many lows and also some hyperglycemic spikes, particularly after these lows.  Upon questioning, he was taking glipizide  for larger meals but he was taking it after the meals, rather than before.  We discussed that this was most likely the reason for the low blood sugars.  We stopped  glipizide  at last visit. CGM interpretation; -At today's visit, we reviewed his CGM downloads: It appears that 74% of values are in target range (goal >70%), while 24% are higher than 180 (goal <25%), and 2% are lower than 70 (goal <4%).  The calculated average blood sugar is 154.  The projected HbA1c for the next 3 months (  GMI) is 7.0%. -Reviewing the CGM trends, sugars appear to be fluctuating mostly within the target range but with hyperglycemic excursions after larger meals.  For example, last night, he had 2 slices of pizza and sugars increased to the 300s.  He is wondering what he needs to do when the sugars increase so much.  We discussed about further reducing portions, and then if sugars still worsen afterwards, he needs to stay well-hydrated and do light exercise.  If sugars continue to increase, we need to intensify his regimen.  Discussed about options for treatment today.  We decided to increase Mounjaro  for now but he will let me know if he continues to have high blood sugars afterwards, after the holidays.  In that case, we can add a low-dose Prandin.  He did increase the dose of metformin  to 3 tablets a day since last visit but I advised him to decrease the dose back to 2 tablets a day due to to his low kidney function. -He also has a lows in the [facility] report but I do not feel that these are completely accurate and he agrees.  I advised him to check with his glucometer if the sensor is reporting lows that he is not feeling especially now that he is off glipizide  and is not on medications that can drop his blood sugars too low. - I suggested to:  Patient Instructions  Please continue: - Metformin  500 mg 2x a day  Increase: - Mounjaro  7.5 mg weekly  If you have lows on the sensor, please verify that with the glucometer.   Please return in 3-4 months.     - we checked his HbA1c: 7.2% (higher) - advised to check sugars at different times of the day - 4x a day, rotating check times -  advised for yearly eye exams >> he is UTD - return to clinic in 3-4 months  2. HL - Latest lipid panel was reviewed from 05/2024: LDL above our target of less than 70, HDL low: Lab Results  Component Value Date   CHOL 150 06/09/2024   HDL 30.20 (L) 06/09/2024   LDLCALC 90 06/09/2024   TRIG 151.0 (H) 06/09/2024   CHOLHDL 5 06/09/2024  - Continues on Crestor  10 mg daily and fenofibrate  160 mg daily.  I recommended CoQ 10 for muscle aches in the past.  3.  Overweight - now out of obesity I category - Ozempic  was not covered.  He had to start Bydureon  as Ozempic  was not covered until he tried this.  However, he was not happy with the Bydureon  injections, sugars were higher and he gained more weight and wanted to switch back to Trulicity .  We started back on Trulicity  but then I suggested Mounjaro  again due to a more powerful effect on both blood sugars and weight.  He was able to start Mounjaro , which she tolerates well. - He lost 14 pounds before last visit and 4 more since then  Lela Fendt, MD PhD [facility]

## 2024-08-01 NOTE — Patient Instructions (Addendum)
 Please continue: - Metformin  500 mg 2x a day  Increase: - Mounjaro  7.5 mg weekly  If you have lows on the sensor, please verify that with the glucometer.   Please return in 3-4 months.

## 2024-08-01 NOTE — Addendum Note (Signed)
 Addended by: CLEOTILDE ROLIN RAMAN on: 08/01/2024 01:44 PM   Modules accepted: Orders

## 2024-09-08 ENCOUNTER — Other Ambulatory Visit: Payer: Self-pay | Admitting: Internal Medicine

## 2024-09-08 ENCOUNTER — Other Ambulatory Visit: Payer: Self-pay

## 2024-09-08 MED ORDER — FREESTYLE LIBRE 3 PLUS SENSOR MISC: 3 refills | Status: AC

## 2024-09-11 NOTE — Telephone Encounter (Signed)
 Order placed in parachute for assistance with libre.

## 2024-10-20 ENCOUNTER — Ambulatory Visit: Admitting: Nurse Practitioner

## 2024-10-30 ENCOUNTER — Ambulatory Visit: Admitting: Internal Medicine

## 2024-11-13 ENCOUNTER — Ambulatory Visit: Admitting: Adult Health

## 2024-11-25 ENCOUNTER — Encounter: Admitting: Nurse Practitioner

## 2024-12-23 ENCOUNTER — Ambulatory Visit
